# Patient Record
Sex: Female | Born: 1990 | Race: Black or African American | Hispanic: No | Smoking: Never smoker
Health system: Southern US, Community
[De-identification: ages and names within clinical notes are randomized; demographics above are authoritative.]

## PROBLEM LIST (undated history)

## (undated) DIAGNOSIS — F329 Major depressive disorder, single episode, unspecified: Secondary | ICD-10-CM

## (undated) DIAGNOSIS — F32A Depression, unspecified: Secondary | ICD-10-CM

## (undated) DIAGNOSIS — F429 Obsessive-compulsive disorder, unspecified: Secondary | ICD-10-CM

## (undated) DIAGNOSIS — F419 Anxiety disorder, unspecified: Secondary | ICD-10-CM

## (undated) DIAGNOSIS — Z915 Personal history of self-harm: Secondary | ICD-10-CM

## (undated) DIAGNOSIS — F99 Mental disorder, not otherwise specified: Secondary | ICD-10-CM

## (undated) DIAGNOSIS — E669 Obesity, unspecified: Secondary | ICD-10-CM

## (undated) DIAGNOSIS — F319 Bipolar disorder, unspecified: Secondary | ICD-10-CM

## (undated) HISTORY — DX: Obesity, unspecified: E66.9

---
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
## 2009-01-02 ENCOUNTER — Ambulatory Visit: Payer: Self-pay | Admitting: Obstetrics and Gynecology

## 2009-01-03 ENCOUNTER — Ambulatory Visit (HOSPITAL_COMMUNITY): Payer: Self-pay | Admitting: Psychology

## 2009-01-17 ENCOUNTER — Ambulatory Visit (HOSPITAL_COMMUNITY): Payer: Self-pay | Admitting: Psychiatry

## 2009-01-22 ENCOUNTER — Ambulatory Visit (HOSPITAL_COMMUNITY): Payer: Self-pay | Admitting: Psychiatry

## 2009-01-31 ENCOUNTER — Ambulatory Visit (HOSPITAL_COMMUNITY): Payer: Self-pay | Admitting: Psychiatry

## 2009-02-13 ENCOUNTER — Ambulatory Visit (HOSPITAL_COMMUNITY): Payer: Self-pay | Admitting: Licensed Clinical Social Worker

## 2009-03-05 ENCOUNTER — Ambulatory Visit (HOSPITAL_COMMUNITY): Payer: Self-pay | Admitting: Licensed Clinical Social Worker

## 2009-03-20 ENCOUNTER — Ambulatory Visit: Payer: Self-pay | Admitting: Obstetrics and Gynecology

## 2009-03-26 ENCOUNTER — Ambulatory Visit: Payer: Self-pay | Admitting: Family Medicine

## 2009-03-27 ENCOUNTER — Ambulatory Visit (HOSPITAL_COMMUNITY): Payer: Self-pay | Admitting: Licensed Clinical Social Worker

## 2009-04-08 ENCOUNTER — Ambulatory Visit (HOSPITAL_COMMUNITY): Payer: Self-pay | Admitting: Psychiatry

## 2009-05-17 ENCOUNTER — Ambulatory Visit: Payer: Self-pay | Admitting: Family Medicine

## 2009-07-08 ENCOUNTER — Ambulatory Visit (HOSPITAL_COMMUNITY): Payer: Self-pay | Admitting: Psychiatry

## 2011-03-17 LAB — POCT URINALYSIS DIP (DEVICE)
Bilirubin Urine: NEGATIVE
Glucose, UA: NEGATIVE mg/dL
Nitrite: NEGATIVE

## 2011-04-14 NOTE — Group Therapy Note (Signed)
NAMEMICHAELINA, BLANDINO NO.:  [id number]   MEDICAL RECORD NO.:  [id number]          PATIENT TYPE:  WOC   LOCATION:  [facility]                   FACILITY:  [facility]   PHYSICIAN:  Dorthula Perfect, MD     DATE OF BIRTH:  July 11, 1991   DATE OF SERVICE:                                  CLINIC NOTE   This  20 year old white female, nulligravid, whose last menstrual period  began November the 9th is here for IUD.  She has problems with bipolar  disorder and OCD, and according to the record from May 2009 has had many  admissions to [facility].  Apparently, IUD has been thought  to be the best means of providing birth control for her.  I am not sure  whether she is sexually active or not.  She has had no children.   Pap smear was done here Apr 06, 2008 and it was a negative.  Her  menstrual periods are cyclic and last 6 to 7 days.  The middle days are  somewhat heavier.  She has only mild cramping.   EXAMINATION:  Height 5 foot 4, weight 190, blood pressure 117/77.  ABDOMEN:  Flat and soft and nontender.  PELVIC:  Completely normal.   Speculum, after exam, is inserted and she has a somewhat small,  nulliparous cervix.  The anterior lip of the cervix is grasped with  sharp tenaculum and an attempt was made to sound the uterus with a  slightly curved uterine sound.  I was not able to pass a sound.  I would  rather not use endocervical dilators on this nulliparous patient.   IMPRESSION:  Attempted intrauterine device placement.   DISPOSITION:  I have asked her to return while she is on her menstrual  period to make another attempt at placing the IUD.  Hopefully, with her  menstrual period, the endocervical canal will be slightly dilated and  that will facilitate IUD placement.           ______________________________  Dorthula Perfect, MD     ER/MEDQ  D:  11/14/2008  T:  11/14/2008  Job:  [id number]

## 2011-04-14 NOTE — Group Therapy Note (Signed)
Natalie [name], Natalie [name] NO.:  [id number]   MEDICAL RECORD NO.:  [id number]          PATIENT TYPE:  WOC   LOCATION:  [facility]                   FACILITY:  [facility]   PHYSICIAN:  Argentina Donovan, MD        DATE OF BIRTH:  1991-10-22   DATE OF SERVICE:                                  CLINIC NOTE   The patient presents for IUD insertion.  Reviewing history briefly, the  patient presented November 14, 2008, and saw Dr. Perlie Gold for an IUD  insertion.  However, he was unable to insert IUD at that time, I then  instructed the patient to return when she was on her menstrual cycle to  make another attempt at placing the IUD.  The patient returns today  desiring an IUD placement.  She started her menstrual period 2 days ago,  describes it as being a normal flow and she is only having mild  cramping.   On examination today, the patient is 5 feet 4 inches,  she weights 191  pounds.  Her blood pressure is 97/63, her pulse 93, temperature is 97.6.  Pap smear was performed in May 2009, and found to be normal.  Her  abdomen is slightly obese and nontender.  No masses.  Pelvic exam is  normal.   PROCEDURE PERFORMED:  Speculum was inserted.  Cervix was easily  visualized and is nulliparous cervix.  It was cleansed using Betadine x2  and a tenaculum was placed at 7 o'clock.  Several attempts were made to  sound the uterus.  However, they were unsuccessful.  Endocervical  dilators size 1, 2, and 3, 4 were used gently on the cervix without  success.  The external os was easily dilated, the internal os was not  penetrated and attempt was aborted.   IMPRESSION:  Today, is attempted intrauterine device placement of Mirena  and contraceptive initiation visit.   DISPOSITION:  The patient was advised of options at this point  concerning birth control, given her medical history as being bipolar and  needing to be on birth control, the patient strongly desires Depo-  Provera injection today.   Education was also provided to the patient  concerning Implanon and was verified with pharmacy that no hormonal  birth control will interfere with medications that she is currently on  for her bipolar disorder.  The patient will be given 150 mg IM of Depo-  Provera.  Her options are to return in 3 months for her second Depo  injection shot or to return for the placement of a Implanon.  The  patient was instructed that if she strongly desires the placement of a  Mirena IUD, that the use of Cytotec 12-24 hours prior to insertion would  be strongly recommended; however, given that 2 failed attempts have now  occurred in placing her IUD, I strongly encouraged another form of birth  control and she agrees.     ______________________________  Maylon Cos, CNM    ______________________________  Argentina Donovan, MD    SS/MEDQ  D:  01/02/2009  T:  01/03/2009  Job:  [id number]
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
## 2011-04-17 NOTE — Discharge Summary (Signed)
Natalie [name], Natalie [name]              ACCOUNT NO.:  [id number]   MEDICAL RECORD NO.:  [id number]          PATIENT TYPE:  INP   LOCATION:  0101                          [facility]ILITY[facility]   PHYSICIAN:  Lalla Brothers, MDDATE OF BIRTH:  Jun 24, 1991   DATE OF ADMISSION:  05/11/2006  DATE OF DISCHARGE:  05/15/2006                                 DISCHARGE SUMMARY   IDENTIFICATION:  This 30-1/21-year-old female, entering the 10th grade this  fall at [employer], was admitted emergently involuntarily on a  [facility] petition for commitment in transfer from [facility] for inpatient stabilization and treatment of suicide  and homicide risk in the course of early treatment of bipolar disorder with  a longstanding history of OCD.  The patient planned to kill parents for  being overprotective and herself having a knife in her room to do so though  she later stated the knife was there from months ago.  For full details,  please see the typed admission assessment.   SYNOPSIS OF PRESENT ILLNESS:  The patient seems to identify with father in  having bipolar illness, though stating that father has much more severe  symptoms.  Mother indicates that father is taking Seroquel, Risperdal,  lithium and carbamazepine with the patient suggesting that father become  somewhat highly medicated.  Patient has done well in school except having  difficulty with math for which she needed a tutor this last school year but  brought her somewhat failing grade back up to a good grade.  Grades are  otherwise A's and B's, performing best in language arts and wanting to be an  actress, singer, or scientist.  The patient seems to look up to older  brother.  There is a paternal aunt with schizophrenia and a maternal cousin  with bipolar and ADHD.  Maternal grandfather has substance abuse with  alcohol and maternal grandmother hypertension.  Mother suggests that the  patient has currently  alienated herself from mother while being daddy's  girl.  However, the patient is somewhat defiant now for all authority.  The  patient outlines contamination, ordering, and symmetry rituals especially  excessive hand-washing.  At the time of admission, she is taking Klonopin  0.5 mg morning and bedtime and Geodon 20 mg  in the morning and 60 mg at  bedtime.   INITIAL MENTAL STATUS EXAM:  The patient was fixated in conflict with family  and the associated developmental social implications.  She was expansive in  her validation of her homicidal ideation initially but then began to  minimize and deny such.  Although she presented with poor judgment, she has  a cognitive capacity for adequate judgment.  Affectively, she becomes  undermining of judgment, particularly with manic symptoms but, at times,  mixed symptoms.  However, she does not readily consolidate a predominant  mood state but tends to be labile and inconsistent.  She has no post-  traumatic stress.   LABORATORY FINDINGS:  CBC revealed MCV slightly elevated at 93.6 with  reference range 78-92.  Red count was slightly low at  3.69 million with  lower limit of normal 3.80 million but hemoglobin was normal at 11.8 and  hematocrit 34.5.  Platelet count was low at 160,000 with reference range  190,000-420,000.  White count was normal at 7700 with normal differential.  Comprehensive metabolic panel was normal with sodium 139, potassium 4,  random glucose 84, creatinine 0.8, calcium 9.4, albumin 3.9, AST 24, ALT 16  and GGT 15.  Free T4 was normal at 1.43 and TSH at 1.659.  Urine HCG was  negative for pregnancy.  Urine drug screen was negative with creatinine of  133 mg/dL documenting specimen adequate.  Urinalysis was normal with  specific gravity of 1.024 and pH 5.5.  RPR was nonreactive.  Urine probe for  gonorrhea and chlamydia trachomatis by DNA amplification were both negative.   HOSPITAL COURSE AND TREATMENT:  General medical  exam by Jorje Guild PA-C noted  pneumonia in infancy and no medication allergies.  The patient reported  gaining 20 pounds in the last two months though she appeared thin.  She  reports eyeglasses and occasional constipation.  Her height was 64 inches  and weight was 123 pounds with blood pressure 127/86 and heart rate 87  (sitting) and 124/84 with heart rate of 78 (standing).  Vital signs were  normal throughout hospital stay with final blood pressure 111/56 with heart  rate of 105 (supine) and 102/56 with heart rate of 132 (standing).  The  patient's Klonopin was discontinued for concern of disinhibition.  The  patient remained hysteroid and labile though in a pleasant and somewhat  constructive pattern through the remainder of the hospital stay.  She was  able to adapt to the unit milieu in programming despite her history of  obsessive-compulsive symptoms.  She engaged readily in all aspects of  therapy though seeming to subgroup with staff similar to being closer to her  father in the family.  Her Geodon was increased to 40 mg in the morning and  80 mg at bedtime.  She tolerated this well with no extrapyramidal signs or  symptoms, no medication associated suicidal ideation, and no sedation or  excessive appetite.  The patient's sleep improved and she worked effectively  in family therapy especially upon admission symptoms.  The family addressed  communication and understanding with each other.  The family notes that the  patient does not assume accountability for her actions or many family  responsibilities.  Family is motivated to continue family therapy and the  patient is more understanding and accepting of her family.  Electrocardiogram on Geodon May 12, 2006 revealed sinus arrhythmia,  otherwise normal with QTC of 384 milliseconds and QRS of 76 milliseconds so  there were no contraindications to Geodon.  The patient required no seclusion or restraint during the hospital stay.   Suicide and homicidal  ideation resolved.   FINAL DIAGNOSES:  AXIS I:  Bipolar disorder not otherwise specified,  currently with partially treated manic symptoms.  Obsessive-compulsive  disorder.  Parent-child problem.  Other specified family circumstances.  Other interpersonal problem.  AXIS II:  Diagnosis deferred.  AXIS III:  Myopia, history of constipation, borderline macrocytosis,  borderline thrombocytopenia.  AXIS IV:  Stressors:  Family--moderate, acute and chronic; phase of life--  severe, acute and chronic; school--mild, acute and chronic.  AXIS V:  GAF on admission 38; highest in last year 76; discharge GAF 55.   CONDITION ON DISCHARGE:  The patient made excellent progress during the  hospital stay with efforts undertaken to  generalize such to home and  community.  She is discharged to parent in improved condition on a regular  diet and having no restrictions on physical activity.  Crisis and safety  plans are outlined if needed.  Her Klonopin is discontinued.   DISCHARGE MEDICATIONS:  Her Geodon is prescribed as 40 mg, taking 1 capsule  every morning and 2 every bedtime; quantity #24 samples given with  prescription for #90 with no refill if needed prior to upcoming appointment.   FOLLOWUP:  She will see Dr. Elsie Saas at [facility] for psychiatric  follow-up May 19, 2006 at 1645.  She will see Maryjane Hurter at [facility] for individual and family therapy May 19, 2006 at 1700.      Lalla Brothers, MD  Electronically Signed     GEJ/MEDQ  D:  05/17/2006  T:  05/17/2006  Job:  [id number]   cc:   Dr. Cathlean [facility]  [address], [address]  [place], [place]  fax [phone]   Maryjane Hurter  [facility]  [address]  [employer] [place], [place]  fax [phone]
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
## 2011-08-20 LAB — POCT PREGNANCY, URINE: Operator id: 247071

## 2011-08-20 LAB — POCT URINALYSIS DIP (DEVICE)
Operator id: 247071
Protein, ur: NEGATIVE
Specific Gravity, Urine: 1.01
Urobilinogen, UA: 0.2
pH: 7

## 2011-08-20 LAB — WET PREP, GENITAL

## 2011-08-20 LAB — GC/CHLAMYDIA PROBE AMP, GENITAL: GC Probe Amp, Genital: NEGATIVE

## 2011-08-24 LAB — WET PREP, GENITAL: Trich, Wet Prep: NONE SEEN

## 2011-08-24 LAB — GC/CHLAMYDIA PROBE AMP, GENITAL: Chlamydia, DNA Probe: NEGATIVE

## 2011-09-10 LAB — COMPREHENSIVE METABOLIC PANEL
AST: 30
Albumin: 4.4
BUN: 7
Calcium: 10.3
Creatinine, Ser: 0.68

## 2011-09-10 LAB — LIPID PANEL
Cholesterol: 270 — ABNORMAL HIGH
LDL Cholesterol: 165 — ABNORMAL HIGH
Total CHOL/HDL Ratio: 4.3
Triglycerides: 209 — ABNORMAL HIGH
VLDL: 42 — ABNORMAL HIGH

## 2011-09-10 LAB — CBC
HCT: 39.5
MCHC: 33.2
MCV: 91.7
Platelets: 263
RDW: 13.8

## 2011-09-10 LAB — URINALYSIS, ROUTINE W REFLEX MICROSCOPIC
Bilirubin Urine: NEGATIVE
Hgb urine dipstick: NEGATIVE
Ketones, ur: NEGATIVE
Protein, ur: NEGATIVE
Urobilinogen, UA: 0.2

## 2011-09-10 LAB — DIFFERENTIAL
Basophils Absolute: 0
Eosinophils Relative: 3
Lymphocytes Relative: 19 — ABNORMAL LOW
Lymphs Abs: 1.8
Monocytes Absolute: 0.4
Neutro Abs: 7.1 — ABNORMAL HIGH

## 2011-09-10 LAB — TSH: TSH: 3.114

## 2011-09-10 LAB — T4, FREE: Free T4: 1.36

## 2011-12-06 ENCOUNTER — Emergency Department (HOSPITAL_COMMUNITY)
Admission: EM | Admit: 2011-12-06 | Discharge: 2011-12-06 | Disposition: A | Discharge status: Home / Self Care | Payer: Self-pay | Attending: Emergency Medicine | Admitting: Emergency Medicine

## 2011-12-06 ENCOUNTER — Encounter: Payer: Self-pay | Admitting: *Deleted

## 2011-12-06 DIAGNOSIS — L2989 Other pruritus: Secondary | ICD-10-CM | POA: Insufficient documentation

## 2011-12-06 DIAGNOSIS — M545 Low back pain, unspecified: Secondary | ICD-10-CM | POA: Insufficient documentation

## 2011-12-06 DIAGNOSIS — R21 Rash and other nonspecific skin eruption: Secondary | ICD-10-CM | POA: Insufficient documentation

## 2011-12-06 DIAGNOSIS — Z79899 Other long term (current) drug therapy: Secondary | ICD-10-CM | POA: Insufficient documentation

## 2011-12-06 DIAGNOSIS — L298 Other pruritus: Secondary | ICD-10-CM | POA: Insufficient documentation

## 2011-12-06 HISTORY — DX: Mental disorder, not otherwise specified: F99

## 2011-12-06 MED ORDER — HYDROCODONE-ACETAMINOPHEN 5-325 MG PO TABS: 1.0000 | ORAL_TABLET | Freq: Four times a day (QID) | ORAL | Status: AC | PRN

## 2011-12-06 NOTE — ED Notes (Signed)
Pt stated understanding of discharge instructions.

## 2011-12-06 NOTE — ED Notes (Signed)
The skin on her neck is darker than the rest of her body and sometimes it tiches  For 2 weeks.  She is also c/o some lower back pain for 3-4 weeks.  No injury

## 2011-12-06 NOTE — ED Provider Notes (Signed)
History     CSN: [id number]  Arrival date & time 12/06/11  1523   First MD Initiated Contact with Patient 12/06/11 1853      Chief Complaint  Patient presents with  . Rash   low back pain  (Consider location/radiation/quality/duration/timing/severity/associated sxs/prior treatment) HPI This 21 year old female has a 2 week history of almost constant mild to moderate lumbar pain which is nonradiating and without associated symptoms it gets better with Tylenol or ibuprofen as currently gone this afternoon in the emergency department. There was no trauma. She is no fever or IV drug abuse. She is no radiation or pain down her legs. She is no change in bowel or bladder function. She is no weakness or numbness. She has no chest pain cough shortness breath abdominal pain nausea or vomiting. This is a low back pain across her lumbar region with no trauma. She is currently pain-free. She also has about a month or more of a constant hyperpigmentation around the base of her neck but no new exposures no pain no blistering no bruising no swelling and just occasional itching. Past Medical History  Diagnosis Date  . Psychiatric diagnosis     History reviewed. No pertinent past surgical history.  History reviewed. No pertinent family history.  History  Substance Use Topics  . Smoking status: Never Smoker   . Smokeless tobacco: Not on file  . Alcohol Use: No    OB History    Grav Para Term Preterm Abortions TAB SAB Ect Mult Living                  Review of Systems  Constitutional: Negative for fever.       10 Systems reviewed and are negative for acute change except as noted in the HPI.  HENT: Negative for congestion.   Eyes: Negative for discharge and redness.  Respiratory: Negative for cough and shortness of breath.   Cardiovascular: Negative for chest pain.  Gastrointestinal: Negative for vomiting and abdominal pain.  Musculoskeletal: Positive for back pain.  Skin: Positive for rash.   Neurological: Negative for syncope, numbness and headaches.  Psychiatric/Behavioral:       No behavior change.    Allergies  Review of patient's allergies indicates no known allergies.  Home Medications   Current Outpatient Rx  Name Route Sig Dispense Refill  . BUSPIRONE HCL 10 MG PO TABS Oral Take 10 mg by mouth 2 (two) times daily.      . OMEGA-3 FATTY ACIDS 1000 MG PO CAPS Oral Take 1 g by mouth 2 (two) times daily.      Marland Kitchen LITHIUM CARBONATE 300 MG PO CAPS Oral Take 600-900 mg by mouth 2 (two) times daily with a meal. 600 mg in am and 900 mg in pm     . OLANZAPINE 10 MG PO TABS Oral Take 10 mg by mouth at bedtime.      Marland Kitchen PROPRANOLOL HCL 10 MG PO TABS Oral Take 10 mg by mouth 2 (two) times daily.      Marland Kitchen HYDROCODONE-ACETAMINOPHEN 5-325 MG PO TABS Oral Take 1 tablet by mouth every 6 (six) hours as needed for pain. 20 tablet 0    BP 127/83  Pulse 93  Temp(Src) 98.6 F (37 C) (Oral)  Resp 16  SpO2 98%  LMP 11/19/2011  Physical Exam  Nursing note and vitals reviewed. Constitutional:       Awake, alert, nontoxic appearance with baseline speech.  HENT:  Head: Atraumatic.  Eyes: Pupils are  equal, round, and reactive to light. Right eye exhibits no discharge. Left eye exhibits no discharge.  Neck: Neck supple.  Cardiovascular: Normal rate and regular rhythm.   No murmur heard. Pulmonary/Chest: Effort normal and breath sounds normal. No respiratory distress. She has no wheezes. She has no rales. She exhibits no tenderness.  Abdominal: Soft. Bowel sounds are normal. She exhibits no mass. There is no tenderness. There is no rebound.  Musculoskeletal: She exhibits no edema and no tenderness.       Thoracic back: She exhibits no tenderness.       Lumbar back: She exhibits no tenderness.       Bilateral lower extremities non tender without new rashes or color change, baseline ROM with intact DP / PT pulses, CR<2 secs all digits bilaterally, sensation baseline light touch bilaterally  for pt, DTR's symmetric and intact bilaterally KJ / AJ, motor symmetric bilateral 5 / 5 hip flexion, quadriceps, hamstrings, EHL, foot dorsiflexion, foot plantarflexion, gait somewhat antalgic but without apparent new ataxia. Entire back and neck are nontender  Neurological:       Mental status baseline for patient.  Upper extremity motor strength and sensation intact and symmetric bilaterally.  Skin: Rash noted.       The patient has dry skin with hyperpigmentation at the base of her neck in the distribution of a necklace but no tenderness no petechiae no purpura no vesicles yet she has not been wearing jewelry  Psychiatric: She has a normal mood and affect.    ED Course  Procedures (including critical care time)  Labs Reviewed - No data to display No results found.   1. Lumbar pain   2. Rash       MDM  I doubt any other EMC precluding discharge at this time including, but not necessarily limited to the following:SBI, radiculopathy.        Hurman Horn, MD 12/06/11 2130

## 2012-01-08 ENCOUNTER — Emergency Department (HOSPITAL_COMMUNITY)
Admission: EM | Admit: 2012-01-08 | Discharge: 2012-01-08 | Disposition: A | Discharge status: Home / Self Care | Payer: Self-pay | Attending: Emergency Medicine | Admitting: Emergency Medicine

## 2012-01-08 ENCOUNTER — Encounter (HOSPITAL_COMMUNITY): Payer: Self-pay | Admitting: Emergency Medicine

## 2012-01-08 DIAGNOSIS — F319 Bipolar disorder, unspecified: Secondary | ICD-10-CM | POA: Insufficient documentation

## 2012-01-08 DIAGNOSIS — Z79899 Other long term (current) drug therapy: Secondary | ICD-10-CM | POA: Insufficient documentation

## 2012-01-08 DIAGNOSIS — N390 Urinary tract infection, site not specified: Secondary | ICD-10-CM | POA: Insufficient documentation

## 2012-01-08 DIAGNOSIS — R112 Nausea with vomiting, unspecified: Secondary | ICD-10-CM | POA: Insufficient documentation

## 2012-01-08 DIAGNOSIS — R197 Diarrhea, unspecified: Secondary | ICD-10-CM | POA: Insufficient documentation

## 2012-01-08 DIAGNOSIS — F429 Obsessive-compulsive disorder, unspecified: Secondary | ICD-10-CM | POA: Insufficient documentation

## 2012-01-08 HISTORY — DX: Bipolar disorder, unspecified: F31.9

## 2012-01-08 HISTORY — DX: Obsessive-compulsive disorder, unspecified: F42.9

## 2012-01-08 LAB — URINE MICROSCOPIC-ADD ON

## 2012-01-08 LAB — URINALYSIS, ROUTINE W REFLEX MICROSCOPIC
Glucose, UA: NEGATIVE mg/dL
Ketones, ur: NEGATIVE mg/dL
Nitrite: NEGATIVE
Specific Gravity, Urine: 1.008 (ref 1.005–1.030)
pH: 7.5 (ref 5.0–8.0)

## 2012-01-08 LAB — COMPREHENSIVE METABOLIC PANEL
ALT: 48 U/L — ABNORMAL HIGH (ref 0–35)
Alkaline Phosphatase: 150 U/L — ABNORMAL HIGH (ref 39–117)
BUN: 7 mg/dL (ref 6–23)
CO2: 23 mEq/L (ref 19–32)
Chloride: 106 mEq/L (ref 96–112)
GFR calc Af Amer: >90 mL/min (ref >90)
Glucose, Bld: 108 mg/dL — ABNORMAL HIGH (ref 70–99)
Potassium: 3.6 mEq/L (ref 3.5–5.1)
Sodium: 141 mEq/L (ref 135–145)
Total Bilirubin: 0.5 mg/dL (ref 0.3–1.2)
Total Protein: 7.4 g/dL (ref 6.0–8.3)

## 2012-01-08 LAB — DIFFERENTIAL
Eosinophils Absolute: 0.2 10*3/uL (ref 0.0–0.7)
Lymphocytes Relative: 30 % (ref 12–46)
Lymphs Abs: 2.4 10*3/uL (ref 0.7–4.0)
Monocytes Relative: 6 % (ref 3–12)
Neutro Abs: 5 10*3/uL (ref 1.7–7.7)
Neutrophils Relative %: 62 % (ref 43–77)

## 2012-01-08 LAB — POCT PREGNANCY, URINE: Preg Test, Ur: NEGATIVE

## 2012-01-08 LAB — CBC
Hemoglobin: 12.8 g/dL (ref 12.0–15.0)
MCH: 30.6 pg (ref 26.0–34.0)
Platelets: 227 10*3/uL (ref 150–400)
RBC: 4.18 MIL/uL (ref 3.87–5.11)
WBC: 8.1 10*3/uL (ref 4.0–10.5)

## 2012-01-08 MED ORDER — SULFAMETHOXAZOLE-TRIMETHOPRIM 800-160 MG PO TABS: 1.0000 | ORAL_TABLET | Freq: Two times a day (BID) | ORAL | Status: AC

## 2012-01-08 MED ORDER — PROMETHAZINE HCL 25 MG PO TABS: 25.0000 mg | ORAL_TABLET | Freq: Four times a day (QID) | ORAL | Status: AC | PRN

## 2012-01-08 MED ORDER — ONDANSETRON HCL 8 MG PO TABS: 4.0000 mg | ORAL_TABLET | Freq: Once | ORAL | Status: DC

## 2012-01-08 MED ORDER — ONDANSETRON 4 MG PO TBDP
ORAL_TABLET | ORAL | Status: AC
  Filled 2012-01-08: qty 1

## 2012-01-08 NOTE — ED Notes (Signed)
Pt has not had n/v/d in 3 days. Pt states that she has been gaining weight with her meds.

## 2012-01-08 NOTE — ED Notes (Signed)
Pt d/c home in NAD. Pt ambulated with quick, steady gait.  

## 2012-01-08 NOTE — ED Notes (Signed)
Pt here c/o nausea and vomiting and diarrhea x 1 week; pt sts feels like her bipolar meds are causing this; pt sts recent weight gain also; pt sts LMP was in January

## 2012-01-08 NOTE — ED Provider Notes (Signed)
History     CSN: [id number]  Arrival date & time 01/08/12  1352   First MD Initiated Contact with Patient 01/08/12 1443      Chief Complaint  Patient presents with  . Nausea  . Diarrhea    (Consider location/radiation/quality/duration/timing/severity/associated sxs/prior treatment) HPI Pt with 5 days of N/V/D. No abd pain, fever, chills, dysuria. No recent travel or dietary changes. No blood in stool or vomit. Vomiting has improved significantly though she still has loose stools.  Past Medical History  Diagnosis Date  . Psychiatric diagnosis   . Chronic bipolar disorder   . OCD (obsessive compulsive disorder)     History reviewed. No pertinent past surgical history.  History reviewed. No pertinent family history.  History  Substance Use Topics  . Smoking status: Never Smoker   . Smokeless tobacco: Not on file  . Alcohol Use: No    OB History    Grav Para Term Preterm Abortions TAB SAB Ect Mult Living                  Review of Systems  Constitutional: Negative for fever and chills.  Respiratory: Negative for shortness of breath.   Cardiovascular: Negative for chest pain.  Gastrointestinal: Positive for nausea, vomiting and diarrhea. Negative for abdominal pain, blood in stool and abdominal distention.  Genitourinary: Negative for dysuria, flank pain and pelvic pain.  Musculoskeletal: Negative for back pain.  Skin: Negative for color change, pallor and rash.  Neurological: Negative for dizziness, weakness, numbness and headaches.    Allergies  Review of patient's allergies indicates no known allergies.  Home Medications   Current Outpatient Rx  Name Route Sig Dispense Refill  . BUSPIRONE HCL 10 MG PO TABS Oral Take 10 mg by mouth 2 (two) times daily.      . OMEGA-3 FATTY ACIDS 1000 MG PO CAPS Oral Take 1 g by mouth 2 (two) times daily.      Marland Kitchen LITHIUM CARBONATE 300 MG PO CAPS Oral Take 600-900 mg by mouth 2 (two) times daily with a meal. 600 mg in am and 900  mg in pm     . OLANZAPINE 10 MG PO TABS Oral Take 10 mg by mouth at bedtime.      Marland Kitchen PROPRANOLOL HCL 10 MG PO TABS Oral Take 10 mg by mouth 2 (two) times daily.        BP 128/74  Pulse 99  Temp(Src) 98.7 F (37.1 C) (Oral)  Resp 18  SpO2 98%  Physical Exam  Nursing note and vitals reviewed. Constitutional: She is oriented to person, place, and time. She appears well-developed and well-nourished. No distress.  HENT:  Head: Normocephalic and atraumatic.  Mouth/Throat: Oropharynx is clear and moist.  Eyes: EOM are normal. Pupils are equal, round, and reactive to light.  Neck: Normal range of motion. Neck supple.  Cardiovascular: Normal rate and regular rhythm.   Pulmonary/Chest: Effort normal and breath sounds normal. No respiratory distress. She has no wheezes. She has no rales.  Abdominal: Soft. Bowel sounds are normal. She exhibits no mass. There is no tenderness. There is no rebound and no guarding.  Musculoskeletal: Normal range of motion. She exhibits no edema and no tenderness.  Neurological: She is alert and oriented to person, place, and time.  Skin: Skin is warm and dry. No rash noted. No erythema.  Psychiatric: She has a normal mood and affect. Her behavior is normal.    ED Course  Procedures (including critical care time)  Labs Reviewed  URINALYSIS, ROUTINE W REFLEX MICROSCOPIC - Abnormal; Notable for the following:    Hgb urine dipstick SMALL (*)    Leukocytes, UA MODERATE (*)    All other components within normal limits  POCT PREGNANCY, URINE  URINE MICROSCOPIC-ADD ON  CBC  DIFFERENTIAL  COMPREHENSIVE METABOLIC PANEL   No results found.   No diagnosis found.    MDM    Pt states she is feeling better. Asking for food. Encouraged to f/u with psychiatrist and return forany concerns      Loren Racer, MD 01/09/12 [phone]

## 2012-02-10 ENCOUNTER — Emergency Department (HOSPITAL_COMMUNITY)
Admission: EM | Admit: 2012-02-10 | Discharge: 2012-02-10 | Discharge status: Against Medical Advice | Payer: Self-pay | Attending: Emergency Medicine | Admitting: Emergency Medicine

## 2012-02-10 ENCOUNTER — Encounter (HOSPITAL_COMMUNITY): Payer: Self-pay | Admitting: Emergency Medicine

## 2012-02-10 DIAGNOSIS — Z049 Encounter for examination and observation for unspecified reason: Secondary | ICD-10-CM | POA: Insufficient documentation

## 2012-02-10 NOTE — ED Notes (Signed)
Pt is c/o nausea and vomiting that started a week ago  Pt states it started after the dr changed her medications and she was started on saphris  Pt states she is unable to hold anything down  Pt denies pain but states she feels weak and tired

## 2013-05-30 DIAGNOSIS — Z9151 Personal history of suicidal behavior: Secondary | ICD-10-CM

## 2013-05-30 HISTORY — DX: Personal history of suicidal behavior: Z91.51

## 2013-06-15 ENCOUNTER — Emergency Department (HOSPITAL_COMMUNITY)
Admission: EM | Admit: 2013-06-15 | Discharge: 2013-06-17 | Disposition: A | Discharge status: Home / Self Care | Payer: No Typology Code available for payment source | Attending: Emergency Medicine | Admitting: Emergency Medicine

## 2013-06-15 ENCOUNTER — Encounter (HOSPITAL_COMMUNITY): Payer: Self-pay | Admitting: *Deleted

## 2013-06-15 DIAGNOSIS — T438X2A Poisoning by other psychotropic drugs, intentional self-harm, initial encounter: Secondary | ICD-10-CM | POA: Insufficient documentation

## 2013-06-15 DIAGNOSIS — Z3202 Encounter for pregnancy test, result negative: Secondary | ICD-10-CM | POA: Insufficient documentation

## 2013-06-15 DIAGNOSIS — T43502A Poisoning by unspecified antipsychotics and neuroleptics, intentional self-harm, initial encounter: Secondary | ICD-10-CM | POA: Insufficient documentation

## 2013-06-15 DIAGNOSIS — Z79899 Other long term (current) drug therapy: Secondary | ICD-10-CM | POA: Insufficient documentation

## 2013-06-15 DIAGNOSIS — R45851 Suicidal ideations: Secondary | ICD-10-CM | POA: Insufficient documentation

## 2013-06-15 DIAGNOSIS — F3289 Other specified depressive episodes: Secondary | ICD-10-CM | POA: Insufficient documentation

## 2013-06-15 DIAGNOSIS — F319 Bipolar disorder, unspecified: Secondary | ICD-10-CM

## 2013-06-15 DIAGNOSIS — F429 Obsessive-compulsive disorder, unspecified: Secondary | ICD-10-CM | POA: Insufficient documentation

## 2013-06-15 DIAGNOSIS — F329 Major depressive disorder, single episode, unspecified: Secondary | ICD-10-CM | POA: Insufficient documentation

## 2013-06-15 LAB — CBC
HCT: 36.8 % (ref 36.0–46.0)
Hemoglobin: 12.2 g/dL (ref 12.0–15.0)
MCH: 31.2 pg (ref 26.0–34.0)
MCHC: 33.2 g/dL (ref 30.0–36.0)
MCV: 94.1 fL (ref 78.0–100.0)
RBC: 3.91 MIL/uL (ref 3.87–5.11)

## 2013-06-15 LAB — COMPREHENSIVE METABOLIC PANEL
ALT: 53 U/L — ABNORMAL HIGH (ref 0–35)
BUN: 6 mg/dL (ref 6–23)
CO2: 28 mEq/L (ref 19–32)
Calcium: 9.6 mg/dL (ref 8.4–10.5)
GFR calc Af Amer: >90 mL/min (ref >90)
GFR calc non Af Amer: >90 mL/min (ref >90)
Glucose, Bld: 112 mg/dL — ABNORMAL HIGH (ref 70–99)
Sodium: 139 mEq/L (ref 135–145)
Total Protein: 7.3 g/dL (ref 6.0–8.3)

## 2013-06-15 LAB — URINALYSIS, ROUTINE W REFLEX MICROSCOPIC
Nitrite: NEGATIVE
Protein, ur: NEGATIVE mg/dL
Specific Gravity, Urine: 1.007 (ref 1.005–1.030)
Urobilinogen, UA: 0.2 mg/dL (ref 0.0–1.0)

## 2013-06-15 LAB — POCT I-STAT, CHEM 8
Calcium, Ion: 1.24 mmol/L — ABNORMAL HIGH (ref 1.12–1.23)
Calcium, Ion: 1.24 mmol/L — ABNORMAL HIGH (ref 1.12–1.23)
Chloride: 107 mEq/L (ref 96–112)
Creatinine, Ser: 0.8 mg/dL (ref 0.50–1.10)
Glucose, Bld: 106 mg/dL — ABNORMAL HIGH (ref 70–99)
HCT: 42 % (ref 36.0–46.0)
Potassium: 3.7 mEq/L (ref 3.5–5.1)
TCO2: 26 mmol/L (ref 0–100)

## 2013-06-15 LAB — URINE MICROSCOPIC-ADD ON

## 2013-06-15 LAB — RAPID URINE DRUG SCREEN, HOSP PERFORMED
Cocaine: NOT DETECTED
Opiates: NOT DETECTED

## 2013-06-15 LAB — SALICYLATE LEVEL: Salicylate Lvl: <2 mg/dL — ABNORMAL LOW (ref 2.8–20.0)

## 2013-06-15 LAB — ETHANOL: Alcohol, Ethyl (B): <11 mg/dL (ref 0–11)

## 2013-06-15 LAB — LITHIUM LEVEL
Lithium Lvl: 1.25 mEq/L (ref 0.80–1.40)
Lithium Lvl: 1.11 mEq/L (ref 0.80–1.40)

## 2013-06-15 NOTE — ED Notes (Signed)
Charge nurse in to speak to husband because he is upset that he cannot keep belongings at bedside.

## 2013-06-15 NOTE — ED Notes (Addendum)
piosion control called recommendations to redraw litium at 4 hour from results for last litium and IV hydration for normal urine output 25ml/h. Repeat electrolyte in AM. Pt monitor for AMS.

## 2013-06-15 NOTE — ED Notes (Signed)
Patient husband has taken belongings to the car. He is upset. In to speak to patient about her lithium level rising. She nods in agreement.

## 2013-06-15 NOTE — BH Assessment (Signed)
[facility] Assessment Progress Note      Update:  Consulted with EDP [place] who stated another Lithium level will be drawn later today.  Pt will be seen by ACT if medically cleared at that time.

## 2013-06-15 NOTE — ED Notes (Signed)
RN talked to patient about her Lithium level coming back high. RN asked if patient took too many pills. Pt states she took a handful of Lithium pills. Pt states she does not know how many she took but she knows its more than 10 pills and it filled her hand. Family at bedside. Pt instructed to remove clothing and be placed in paper gowns. Pt was told she will be moved to a monitored room and a sitter by the bedside at all times. Pt states she understands. Pt obeyed all instructions and walked her and family down to room 21. Rn instructed family she and he cannot have any personal items in the room. Pt family began to get upset. Charge RN at bedside to talk to family and patient.

## 2013-06-15 NOTE — ED Notes (Signed)
Mr Hino, pt's husband: Please call when pt has a dispo.  [phone].

## 2013-06-15 NOTE — ED Notes (Signed)
Phlebotomy notified of labs needing to be drawn.

## 2013-06-15 NOTE — ED Notes (Signed)
Per pt's husband - please do not draw any additional blood from R AC anterior lateral vein as it is bruised from previous lab draw.
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
## 2013-06-15 NOTE — ED Provider Notes (Signed)
History    CSN: [id number] Arrival date & time 06/15/13  0037  First MD Initiated Contact with Patient 06/15/13 0059     Chief Complaint  Patient presents with  . Drug Overdose   (Consider location/radiation/quality/duration/timing/severity/associated sxs/prior Treatment) HPI Comments: Pt comes in with cc of Overdose. Pt states that she got overwhelmed with few of the domestic and personal issues, and felt suicidal. She took 8 lithium pills, and 405 azyprexa and buspirone around 11 pm. Pt denies nausea, emesis, fevers, chills, chest pains, shortness of breath, headaches, abdominal pain, uti like symptoms.   Patient is a 22 y.o. female presenting with Overdose. The history is provided by the patient.  Drug Overdose Pertinent negatives include no chest pain, no abdominal pain, no headaches and no shortness of breath.   Past Medical History  Diagnosis Date  . Psychiatric diagnosis   . Chronic bipolar disorder   . OCD (obsessive compulsive disorder)    History reviewed. No pertinent past surgical history. History reviewed. No pertinent family history. History  Substance Use Topics  . Smoking status: Never Smoker   . Smokeless tobacco: Not on file  . Alcohol Use: No   OB History   Grav Para Term Preterm Abortions TAB SAB Ect Mult Living                 Review of Systems  Constitutional: Positive for activity change.  HENT: Negative for neck pain.   Respiratory: Negative for shortness of breath.   Cardiovascular: Negative for chest pain.  Gastrointestinal: Negative for nausea, vomiting and abdominal pain.  Genitourinary: Negative for dysuria.  Neurological: Negative for headaches.  Psychiatric/Behavioral: Positive for suicidal ideas.    Allergies  Review of patient's allergies indicates no known allergies.  Home Medications   Current Outpatient Rx  Name  Route  Sig  Dispense  Refill  . busPIRone (BUSPAR) 10 MG tablet   Oral   Take 5 mg by mouth 2 (two) times  daily.          Marland Kitchen lithium carbonate 300 MG capsule   Oral   Take 600-900 mg by mouth 2 (two) times daily with a meal. 600 mg in am and 900 mg in pm         . OLANZapine (ZYPREXA) 10 MG tablet   Oral   Take 10 mg by mouth at bedtime.            BP 115/74  Pulse 101  Temp(Src) 98.8 F (37.1 C)  Resp 16  SpO2 94% Physical Exam  Nursing note and vitals reviewed. Constitutional: She is oriented to person, place, and time. She appears well-developed and well-nourished.  HENT:  Head: Normocephalic and atraumatic.  Eyes: EOM are normal. Pupils are equal, round, and reactive to light.  Neck: Neck supple.  Cardiovascular: Normal rate, regular rhythm and normal heart sounds.   No murmur heard. Pulmonary/Chest: Effort normal. No respiratory distress.  Abdominal: Soft. She exhibits no distension. There is no tenderness. There is no rebound and no guarding.  Neurological: She is alert and oriented to person, place, and time.  Skin: Skin is warm and dry.    ED Course  Procedures (including critical care time) Labs Reviewed  URINALYSIS, ROUTINE W REFLEX MICROSCOPIC - Abnormal; Notable for the following:    APPearance CLOUDY (*)    Hgb urine dipstick LARGE (*)    Leukocytes, UA SMALL (*)    All other components within normal limits  COMPREHENSIVE METABOLIC PANEL -  Abnormal; Notable for the following:    Glucose, Bld 112 (*)    AST 60 (*)    ALT 53 (*)    All other components within normal limits  SALICYLATE LEVEL - Abnormal; Notable for the following:    Salicylate Lvl <2.0 (*)    All other components within normal limits  URINE MICROSCOPIC-ADD ON - Abnormal; Notable for the following:    Squamous Epithelial / LPF MANY (*)    Bacteria, UA FEW (*)    All other components within normal limits  URINE CULTURE  URINE RAPID DRUG SCREEN (HOSP PERFORMED)  CBC  ETHANOL  ACETAMINOPHEN LEVEL  LITHIUM LEVEL  POCT PREGNANCY, URINE   No results found. No diagnosis found.  MDM    Date: 06/15/2013  Rate: 80  Rhythm: normal sinus rhythm  QRS Axis: normal  Intervals: normal  ST/T Wave abnormalities: normal  Conduction Disutrbances: none  Narrative Interpretation: unremarkable  Pt comes in with cc of OD. Lithium 300 mg x 8 - as acute ingestion. Lithium level is WNL. We will start hydration. No other medical concerns at this time, and the tx for the other 2 pills she took is supportive. RN to call poison control to see if they have any targeted recs for lithium, ACT consulted and spoke too.  Derwood Kaplan, MD 06/15/13 [phone]

## 2013-06-15 NOTE — ED Notes (Signed)
Critical result called to nanavati

## 2013-06-15 NOTE — ED Notes (Addendum)
Around 23:00 Pt states that she overdose on her litium, zelpraxa, and buspurion. Pt states that she intinally took her pills, and was having suicidal thoughts, but denies suicidal thoughts at this time. Pt states that she believes it was side effect of taking all the medications. Pt states that she has not had a refular check up for her lithium levels. Pt has mental health with Vesta Mixer, MD [place] [facility] is her mental health doctor.  Pt states she took about half a months worth of each medication. Pt takes some medications 3x a day. Pt denies SI or HI.

## 2013-06-16 ENCOUNTER — Inpatient Hospital Stay (HOSPITAL_COMMUNITY)
Admission: AD | Admit: 2013-06-16 | Payer: No Typology Code available for payment source | Source: Intra-hospital | Admitting: Psychiatry

## 2013-06-16 NOTE — ED Notes (Signed)
Per ACT team will set up TelePsych Eval for patient. Family at bedside. Sitter at bedside.

## 2013-06-16 NOTE — ED Notes (Signed)
Pt. To be telepsych and pending assessment will either be admitted to [facility] or sent home. Pt. And family updated on plan of care. Pt. Husband calm and cooperative.

## 2013-06-16 NOTE — ED Notes (Signed)
Update pt. On her plan of care.  Informed pt. That she will be assessed by Behavioral Health this afternoon.

## 2013-06-16 NOTE — ED Notes (Signed)
ACT team member in to assess patient without husband in room.
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
## 2013-06-16 NOTE — ED Notes (Addendum)
Pt. Husband increasingly frustrated stating "You know I could sue you for this. This is malpractice!". ACT team member and this nurse informed husband that patient admission to [facility] was in process and we were waiting on a bed. Husband states "She does not need a bed. Everyone says that she is better and they don't know why she is still here. She does not need to be admitted".

## 2013-06-16 NOTE — ED Notes (Signed)
Telepsych set up at bedside. 

## 2013-06-16 NOTE — BH Assessment (Signed)
[facility] Assessment Progress Note      Accepted to the 500 hall by Dr. Dub Mikes when a bed becomes available later today.

## 2013-06-16 NOTE — ED Notes (Signed)
Per psychiatrist, pt to bed sent home. Pt. Husband stating "Let me go talk to the emergency physician because she needs to be discharged now.". Husband informed of process for discharge. Verbalized understanding.

## 2013-06-16 NOTE — ED Notes (Signed)
gingerale ,  G. Crackers and peanut butter given.

## 2013-06-16 NOTE — ED Notes (Signed)
Pt husband requested 2 Happy Meals, 2 Cokes and a cup of ice water for his wife. Pt husband was given these items.

## 2013-06-16 NOTE — ED Notes (Addendum)
Pt. Husband at nurses station inquiring about status of patient placement. Pt. Husband frustrated and angry stating "you all are just trying to get money out of [place]. She has been here for a day and nothing is happening. Where is the psychiatrist? I will go myself and talk to him". Informed husband of admission process and updated on plan of care.

## 2013-06-16 NOTE — ED Notes (Signed)
Pt husband requested 2 cups of ice water and the water pitcher filled with ice water. Pt husband is given these items.

## 2013-06-17 LAB — URINE CULTURE

## 2013-12-21 ENCOUNTER — Encounter (HOSPITAL_COMMUNITY): Payer: Self-pay | Admitting: Emergency Medicine

## 2013-12-21 ENCOUNTER — Emergency Department (HOSPITAL_COMMUNITY)
Admission: EM | Admit: 2013-12-21 | Discharge: 2013-12-22 | Disposition: A | Discharge status: Other Acute Inpatient Hospital | Payer: No Typology Code available for payment source | Attending: Emergency Medicine | Admitting: Emergency Medicine

## 2013-12-21 DIAGNOSIS — T43502A Poisoning by unspecified antipsychotics and neuroleptics, intentional self-harm, initial encounter: Secondary | ICD-10-CM | POA: Insufficient documentation

## 2013-12-21 DIAGNOSIS — F319 Bipolar disorder, unspecified: Secondary | ICD-10-CM | POA: Insufficient documentation

## 2013-12-21 DIAGNOSIS — T6592XA Toxic effect of unspecified substance, intentional self-harm, initial encounter: Secondary | ICD-10-CM | POA: Insufficient documentation

## 2013-12-21 DIAGNOSIS — X838XXA Intentional self-harm by other specified means, initial encounter: Secondary | ICD-10-CM

## 2013-12-21 DIAGNOSIS — T438X2A Poisoning by other psychotropic drugs, intentional self-harm, initial encounter: Secondary | ICD-10-CM

## 2013-12-21 DIAGNOSIS — T50992A Poisoning by other drugs, medicaments and biological substances, intentional self-harm, initial encounter: Secondary | ICD-10-CM | POA: Insufficient documentation

## 2013-12-21 DIAGNOSIS — Z79899 Other long term (current) drug therapy: Secondary | ICD-10-CM | POA: Insufficient documentation

## 2013-12-21 DIAGNOSIS — T5694XA Toxic effect of unspecified metal, undetermined, initial encounter: Secondary | ICD-10-CM | POA: Insufficient documentation

## 2013-12-21 DIAGNOSIS — T50991A Poisoning by other drugs, medicaments and biological substances, accidental (unintentional), initial encounter: Secondary | ICD-10-CM | POA: Insufficient documentation

## 2013-12-21 DIAGNOSIS — T43501A Poisoning by unspecified antipsychotics and neuroleptics, accidental (unintentional), initial encounter: Secondary | ICD-10-CM | POA: Insufficient documentation

## 2013-12-21 LAB — CBC
HCT: 37.5 % (ref 36.0–46.0)
Hemoglobin: 12.1 g/dL (ref 12.0–15.0)
MCH: 30.5 pg (ref 26.0–34.0)
MCHC: 32.3 g/dL (ref 30.0–36.0)
MCV: 94.5 fL (ref 78.0–100.0)
Platelets: 292 10*3/uL (ref 150–400)
RBC: 3.97 MIL/uL (ref 3.87–5.11)
RDW: 13.3 % (ref 11.5–15.5)
WBC: 16 10*3/uL — ABNORMAL HIGH (ref 4.0–10.5)

## 2013-12-21 LAB — BASIC METABOLIC PANEL
BUN: 6 mg/dL (ref 6–23)
CO2: 26 mEq/L (ref 19–32)
Calcium: 10.2 mg/dL (ref 8.4–10.5)
Chloride: 100 mEq/L (ref 96–112)
Creatinine, Ser: 0.91 mg/dL (ref 0.50–1.10)
GFR calc Af Amer: >90 mL/min (ref >90)
GFR calc non Af Amer: 88 mL/min — ABNORMAL LOW (ref >90)
Glucose, Bld: 94 mg/dL (ref 70–99)
Potassium: 3.8 mEq/L (ref 3.7–5.3)
Sodium: 138 mEq/L (ref 137–147)

## 2013-12-21 LAB — ACETAMINOPHEN LEVEL: Acetaminophen (Tylenol), Serum: <15 ug/mL (ref 10–30)

## 2013-12-21 LAB — RAPID URINE DRUG SCREEN, HOSP PERFORMED
Amphetamines: NOT DETECTED
Barbiturates: NOT DETECTED
Benzodiazepines: NOT DETECTED
Cocaine: NOT DETECTED
Opiates: NOT DETECTED
Tetrahydrocannabinol: NOT DETECTED

## 2013-12-21 LAB — SALICYLATE LEVEL: Salicylate Lvl: <2 mg/dL — ABNORMAL LOW (ref 2.8–20.0)

## 2013-12-21 LAB — ETHANOL: Alcohol, Ethyl (B): <11 mg/dL (ref 0–11)

## 2013-12-21 LAB — LITHIUM LEVEL: Lithium Lvl: 1.71 mEq/L (ref 0.80–1.40)

## 2013-12-21 MED ORDER — SODIUM CHLORIDE 0.9 % IV BOLUS (SEPSIS)
2000.0000 mL | Freq: Once | INTRAVENOUS | Status: AC
  Administered 2013-12-21: 2000 mL via INTRAVENOUS

## 2013-12-21 NOTE — ED Notes (Signed)
Pt arrives by EMS/overdosed on Lithium, unknown milligrams-took ~100 tablets, took Buspar 40 tablets, unknown milligrams, and took Jordan, 2 tablets.

## 2013-12-21 NOTE — ED Notes (Signed)
Per poison control, Pt needs 2 boluses of normal saline.

## 2013-12-21 NOTE — ED Notes (Signed)
Spoke with Natalie [name] from [employer] and states watch for drowsiness, seizures, CNS symptoms, clonus, slurred speech, ataxia, QTC prolongation-states he is going to speak with his physician about further treatment and will call back.

## 2013-12-21 NOTE — ED Notes (Signed)
Pt reports that her husband was hospitalized for the past 3 days for stroke like symptoms and states that she was feeling very sad and upset because "I thought he was going to die".  Pt states she lives with her in-laws and that she has not been having a good relationship with them and her family situation at home is very stressful.  Pt states she has a history of depression and overdose in the past.  Pt states she got in to an argument with her Mother-in-law tonight and states she feels like her in-laws expect too much of her.  Pt states after going home tonight, states "I tried to sneak and overdose on my pills.  Pt states took hand full of her lithium and buspar and took her 2 Jordan

## 2013-12-21 NOTE — ED Notes (Signed)
Bed: EK35 Expected date:  Expected time:  Means of arrival:  Comments: EMS/overdose

## 2013-12-22 ENCOUNTER — Inpatient Hospital Stay (HOSPITAL_COMMUNITY)
Admission: AD | Admit: 2013-12-22 | Discharge: 2013-12-27 | DRG: 885 | Disposition: A | Discharge status: Home / Self Care | Payer: No Typology Code available for payment source | Source: Intra-hospital | Attending: Psychiatry | Admitting: Psychiatry

## 2013-12-22 ENCOUNTER — Encounter (HOSPITAL_COMMUNITY): Payer: Self-pay | Admitting: *Deleted

## 2013-12-22 DIAGNOSIS — G4700 Insomnia, unspecified: Secondary | ICD-10-CM | POA: Diagnosis present

## 2013-12-22 DIAGNOSIS — F3130 Bipolar disorder, current episode depressed, mild or moderate severity, unspecified: Secondary | ICD-10-CM | POA: Diagnosis present

## 2013-12-22 DIAGNOSIS — R45851 Suicidal ideations: Secondary | ICD-10-CM

## 2013-12-22 DIAGNOSIS — F314 Bipolar disorder, current episode depressed, severe, without psychotic features: Secondary | ICD-10-CM

## 2013-12-22 DIAGNOSIS — F319 Bipolar disorder, unspecified: Secondary | ICD-10-CM

## 2013-12-22 DIAGNOSIS — F429 Obsessive-compulsive disorder, unspecified: Secondary | ICD-10-CM | POA: Diagnosis present

## 2013-12-22 DIAGNOSIS — Z79899 Other long term (current) drug therapy: Secondary | ICD-10-CM | POA: Diagnosis not present

## 2013-12-22 LAB — BASIC METABOLIC PANEL
BUN: 5 mg/dL — ABNORMAL LOW (ref 6–23)
CO2: 24 mEq/L (ref 19–32)
Calcium: 9.6 mg/dL (ref 8.4–10.5)
Chloride: 107 mEq/L (ref 96–112)
Creatinine, Ser: 0.77 mg/dL (ref 0.50–1.10)
GFR calc Af Amer: >90 mL/min (ref >90)
GFR calc non Af Amer: >90 mL/min (ref >90)
GLUCOSE: 102 mg/dL — AB (ref 70–99)
POTASSIUM: 4.3 meq/L (ref 3.7–5.3)
SODIUM: 143 meq/L (ref 137–147)

## 2013-12-22 LAB — LITHIUM LEVEL
LITHIUM LVL: 1.46 meq/L — AB (ref 0.80–1.40)
Lithium Lvl: 2.12 mEq/L (ref 0.80–1.40)

## 2013-12-22 MED ORDER — ACETAMINOPHEN 325 MG PO TABS: 650.0000 mg | ORAL_TABLET | Freq: Four times a day (QID) | ORAL | Status: DC | PRN

## 2013-12-22 MED ORDER — SODIUM CHLORIDE 0.9 % IV BOLUS (SEPSIS)
1000.0000 mL | Freq: Once | INTRAVENOUS | Status: AC
  Administered 2013-12-22: 1000 mL via INTRAVENOUS

## 2013-12-22 MED ORDER — SODIUM CHLORIDE 0.9 % IV SOLN
INTRAVENOUS | Status: DC
  Administered 2013-12-22: 07:00:00 via INTRAVENOUS

## 2013-12-22 MED ORDER — HYDROXYZINE HCL 50 MG PO TABS
50.0000 mg | ORAL_TABLET | Freq: Every evening | ORAL | Status: DC | PRN
  Administered 2013-12-22: 50 mg via ORAL
  Filled 2013-12-22: qty 1

## 2013-12-22 MED ORDER — ONDANSETRON HCL 4 MG PO TABS: 4.0000 mg | ORAL_TABLET | Freq: Three times a day (TID) | ORAL | Status: DC | PRN

## 2013-12-22 MED ORDER — MAGNESIUM HYDROXIDE 400 MG/5ML PO SUSP: 30.0000 mL | Freq: Every day | ORAL | Status: DC | PRN

## 2013-12-22 MED ORDER — ALUM & MAG HYDROXIDE-SIMETH 200-200-20 MG/5ML PO SUSP: 30.0000 mL | ORAL | Status: DC | PRN

## 2013-12-22 MED ORDER — ONDANSETRON HCL 4 MG/2ML IJ SOLN
4.0000 mg | Freq: Once | INTRAMUSCULAR | Status: AC
  Administered 2013-12-22: 4 mg via INTRAVENOUS
  Filled 2013-12-22: qty 2

## 2013-12-22 NOTE — Progress Notes (Signed)
Patient ID: Natalie [name], female   DOB: [birth year], 23 y.o.   MRN: [id number] Psychiatric Specialty Exam: Physical Exam  ROS  Blood pressure 127/79, pulse 93, temperature 97.8 F (36.6 C), temperature source Oral, resp. rate 16, height 5\' 4"  (1.626 m), weight 108.41 kg (239 lb), last menstrual period 12/21/2013, SpO2 100.00%.Body mass index is 41 kg/(m^2).  General Appearance: Casual  Eye Contact::  Good  Speech:  Clear and Coherent and Normal Rate  Volume:  Normal  Mood:  Angry, Anxious, Depressed, Hopeless, Irritable and Worthless  Affect:  Congruent, Depressed and Flat  Thought Process:  Coherent and Goal Directed  Orientation:  Full (Time, Place, and Person)  Thought Content:  NA  Suicidal Thoughts:  No  Homicidal Thoughts:  No  Memory:  Immediate;   Good Recent;   Good Remote;   Good  Judgement:  Fair  Insight:  Present  Psychomotor Activity:  Normal  Concentration:  Good  Recall:  NA  Akathisia:  NA  Handed:  Right  AIMS (if indicated):     Assets:  Desire for Improvement Housing  Sleep:      Saw patient on rounds with Dr [phone] who was angry, agitated because of her living condition.  Patient states she is under a lot of stress because she and her husband are living with her mother in law.  Patient was tearful stating there are too many people in the house hold and it is affecting her health.  Patient states she is compliant with her medications but the stress at home coupled with losing her job is making her angry.  Patient denies SI/HI/AVH but states her depression is 10/10 and her anxiety 10/10.  Patient states she need stabilization so she can work hard and get a place of her own.  We have accepted her for admission in our mood d/o unit for stabilization. Plan: Admit to inpatient unit and resume all of her home medications.   Lucianne Muss  PMHNP-BC

## 2013-12-22 NOTE — ED Notes (Addendum)
Poison Control recommends:  Take lithium level q 6 hours with a goal of 1 or less. Increase fluids to 250 mL/ hour or to meet urine output goal of 2 mL urine/kg/hr. Repeat basic metabolic panel lab now. If tremors worsen or confusion begins - stat lithium level.  Worry for diabetes insipidous

## 2013-12-22 NOTE — ED Provider Notes (Signed)
CSN: [id number]     Arrival date & time 12/21/13  2105 History   First MD Initiated Contact with Patient 12/21/13 2117     Chief Complaint  Patient presents with  . Drug Overdose  . Suicidal   (Consider location/radiation/quality/duration/timing/severity/associated sxs/prior Treatment) HPI Patient presents emergency department with suicide attempt by taking lithium BuSpar,Latuda.  The patient, states she took an unknown quantity of lithium, Latuda and several BuSpar.  Patient, states she took a large quantity of the lithium.  Patient denies chest pain, shortness of breath, hallucinations, seizure, nausea, vomiting, diarrhea, abdominal pain, back pain, incontinence, fever, blurred vision, or syncope.  Patient, states she attempted suicide in the past, but taking an overdose.  The patient, states she took tonight at her mother-in-law and this is what  forced her to do this. Past Medical History  Diagnosis Date  . Psychiatric diagnosis   . Chronic bipolar disorder   . OCD (obsessive compulsive disorder)    History reviewed. No pertinent past surgical history. No family history on file. History  Substance Use Topics  . Smoking status: Never Smoker   . Smokeless tobacco: Not on file  . Alcohol Use: No   OB History   Grav Para Term Preterm Abortions TAB SAB Ect Mult Living                 Review of Systems All other systems negative except as documented in the HPI. All pertinent positives and negatives as reviewed in the HPI. Allergies  Review of patient's allergies indicates no known allergies.  Home Medications   Current Outpatient Rx  Name  Route  Sig  Dispense  Refill  . busPIRone (BUSPAR) 10 MG tablet   Oral   Take 5 mg by mouth 2 (two) times daily.          Marland Kitchen lithium carbonate 300 MG capsule   Oral   Take 600-900 mg by mouth 2 (two) times daily with a meal. 600 mg in am and 900 mg in pm         . Lurasidone HCl (LATUDA) 20 MG TABS   Oral   Take 1 tablet by mouth  every evening.         . Multiple Vitamin (MULTIVITAMIN WITH MINERALS) TABS tablet   Oral   Take 1 tablet by mouth daily.          BP 124/65  Pulse 87  Temp(Src) 98.8 F (37.1 C) (Oral)  Resp 16  SpO2 99%  LMP 12/21/2013 Physical Exam  Nursing note and vitals reviewed. Constitutional: She is oriented to person, place, and time. She appears well-developed and well-nourished. No distress.  HENT:  Head: Normocephalic and atraumatic.  Mouth/Throat: Oropharynx is clear and moist.  Eyes: EOM are normal. Pupils are equal, round, and reactive to light.  Neck: Normal range of motion. Neck supple.  Cardiovascular: Normal rate, regular rhythm and normal heart sounds.  Exam reveals no gallop and no friction rub.   No murmur heard. Pulmonary/Chest: Effort normal and breath sounds normal.  Neurological: She is alert and oriented to person, place, and time.  Skin: Skin is warm and dry. No rash noted. No erythema.    ED Course  Procedures (including critical care time) Labs Review Labs Reviewed  CBC - Abnormal; Notable for the following:    WBC 16.0 (*)    All other components within normal limits  BASIC METABOLIC PANEL - Abnormal; Notable for the following:    GFR calc  non Af Amer 88 (*)    All other components within normal limits  SALICYLATE LEVEL - Abnormal; Notable for the following:    Salicylate Lvl <2.0 (*)    All other components within normal limits  LITHIUM LEVEL - Abnormal; Notable for the following:    Lithium Lvl 1.71 (*)    All other components within normal limits  URINE RAPID DRUG SCREEN (HOSP PERFORMED)  ETHANOL  ACETAMINOPHEN LEVEL   patient will be monitored over several hours for her lithium levels.  Patient is not showing any outward signs of any lithium toxicity at this point.  She is mentating appropriately.  No tremor or seizure like activity.  Patient's had elevated lithium levels in the past    Carlyle Dolly, PA-C 12/22/13 0105

## 2013-12-22 NOTE — Consult Note (Signed)
[facility] Face-to-Face Psychiatry Consult   Reason for Consult:  Referral for psychiatric evaluation Referring Physician:  Trude Mcburney, PA-C Natalie [name] is an 23 y.o. female.  Assessment: AXIS I:  Bipolar, Depressed AXIS II:  Deferred AXIS III:   Past Medical History  Diagnosis Date  . Psychiatric diagnosis   . Chronic bipolar disorder   . OCD (obsessive compulsive disorder)    AXIS IV:  housing problems, occupational problems, other psychosocial or environmental problems and problems with primary support group AXIS V:  21-30 behavior considerably influenced by delusions or hallucinations OR serious impairment in judgment, communication OR inability to function in almost all areas  Plan:  Recommend psychiatric Inpatient admission when medically cleared.  Subjective:   Natalie [name] is a 23 y.o. female patient who presented to [facility] after taking pills in a suicide attempt. Patient states that over the course of the past 3 days her husband has been hospitalized for stroke symptoms. States that she started "to feel scared for him." States he was going to have a lot of doctor's appointments and "I could not deal with it." This evening she and her husband got into an argument with her father and patient states "my mind broke." States she went into the closet where her medications were kept, turned her back to her husband so he couldn't see what she was doing and she took her medications. Patient states "I knew what I was doing. I know the side effects of the medications and what they can do." Patient states that she took 10 or more Lithium tabs, unknown amount of Buspar and 2 tabs of Latuda. Patient admits that she was trying to kill herself. States that she came to the hospital voluntarily "because I need help."   Patient states that last year her husband lost his job at [employer] and "we lost everything." States that since early 2014 she and her husband have been living with her mother-in-law.  States that she has been depressed over her living situation. She states that she does not have a good relationship with her mother-in-law. Patient is tearful and states that "I can't handle the stress and depression no more." Patient admits to feeling hopeless. State "I have been experiencing anger issues, thoughts of accusing people of things, irritability and being paranoid."   Patient states she was diagnosed with bipolar when she was 14. Patient was hospitalized at [facility] in 2007 and 2008 but states she cannot remember the reason for the admissions. Patient also reports that she overdosed in her early 28s but does not remember where she was hospitalized. Patient states that she has been followed at [facility] since her bipolar diagnosis. States she sees Dr. Santiago Glad at [place]; last visit was December, 2014 and that she sees her monthly. Patient states she feels like her medications need to be adjusted. States that before starting Latuda she was on Zyprexa but was taken off Zyprexa due to "severe weight gain" of "30 lbs every 2 weeks."   Patient denies HI or AVH. She does not drink alcohol nor use illicit drugs.    HPI:  23 yo female here for suicide attempt via drug ingestion. HPI Elements:   Location:  Suicide Attempt/Ideation. Quality:  Overdose. Severity:  Severe depression. Timing:  Frequent . Duration:  Gradually over the past year with increase over past 3 days. Context:  Living situation and after argument with her father.  Past Psychiatric History: Past Medical History  Diagnosis Date  .  Psychiatric diagnosis   . Chronic bipolar disorder   . OCD (obsessive compulsive disorder)     reports that she has never smoked. She does not have any smokeless tobacco history on file. She reports that she does not drink alcohol or use illicit drugs. No family history on file.         Allergies:  No Known Allergies  ACT Assessment Complete:  No:   Past Psychiatric History:  Yes Diagnosis:  Bipolar Depressed; Suicide Attempt  Hospitalizations:  2007 & 2008 [facility]  Outpatient Care:  [facility]  Substance Abuse Care:  None  Self-Mutilation:  None  Suicidal Attempts:  Two   Homicidal Behaviors:  None   Violent Behaviors:  None   Place of Residence:  Lives with husband, mother-in-law and husband's aunt Marital Status:  Married Employed/Unemployed:  Unemployed EducationField seismologist Family Supports:  Husband Objective: Blood pressure 124/65, pulse 87, temperature 98.8 F (37.1 C), temperature source Oral, resp. rate 16, last menstrual period 12/21/2013, SpO2 99.00%.There is no height or weight on file to calculate BMI. Results for orders placed during the hospital encounter of 12/21/13 (from the past 72 hour(s))  LITHIUM LEVEL     Status: Abnormal   Collection Time    12/21/13  9:20 PM      Result Value Range   Lithium Lvl 1.71 (*) 0.80 - 1.40 mEq/L   Comment: CRITICAL RESULT CALLED TO, READ BACK BY AND VERIFIED WITH:     NEILSON,T/ED _0  ON 12/21/13 BY KARCZEWSKI,S.  CBC     Status: Abnormal   Collection Time    12/21/13  9:27 PM      Result Value Range   WBC 16.0 (*) 4.0 - 10.5 K/uL   RBC 3.97  3.87 - 5.11 MIL/uL   Hemoglobin 12.1  12.0 - 15.0 g/dL   HCT 37.5  36.0 - 46.0 %   MCV 94.5  78.0 - 100.0 fL   MCH 30.5  26.0 - 34.0 pg   MCHC 32.3  30.0 - 36.0 g/dL   RDW 13.3  11.5 - 15.5 %   Platelets 292  150 - 400 K/uL  BASIC METABOLIC PANEL     Status: Abnormal   Collection Time    12/21/13  9:27 PM      Result Value Range   Sodium 138  137 - 147 mEq/L   Potassium 3.8  3.7 - 5.3 mEq/L   Chloride 100  96 - 112 mEq/L   CO2 26  19 - 32 mEq/L   Glucose, Bld 94  70 - 99 mg/dL   BUN 6  6 - 23 mg/dL   Creatinine, Ser 0.91  0.50 - 1.10 mg/dL   Calcium 10.2  8.4 - 10.5 mg/dL   GFR calc non Af Amer 88 (*) >90 mL/min   GFR calc Af Amer >90  >90 mL/min   Comment: (NOTE)     The eGFR has been calculated using the CKD EPI equation.     This  calculation has not been validated in all clinical situations.     eGFR's persistently <90 mL/min signify possible Chronic Kidney     Disease.  ETHANOL     Status: None   Collection Time    12/21/13  9:27 PM      Result Value Range   Alcohol, Ethyl (B) <11  0 - 11 mg/dL   Comment:            LOWEST DETECTABLE LIMIT FOR  SERUM ALCOHOL IS 11 mg/dL     FOR MEDICAL PURPOSES ONLY  SALICYLATE LEVEL     Status: Abnormal   Collection Time    12/21/13  9:27 PM      Result Value Range   Salicylate Lvl <7.6 (*) 2.8 - 20.0 mg/dL  ACETAMINOPHEN LEVEL     Status: None   Collection Time    12/21/13  9:27 PM      Result Value Range   Acetaminophen (Tylenol), Serum <15.0  10 - 30 ug/mL   Comment:            THERAPEUTIC CONCENTRATIONS VARY     SIGNIFICANTLY. A RANGE OF 10-30     ug/mL MAY BE AN EFFECTIVE     CONCENTRATION FOR MANY PATIENTS.     HOWEVER, SOME ARE BEST TREATED     AT CONCENTRATIONS OUTSIDE THIS     RANGE.     ACETAMINOPHEN CONCENTRATIONS     >150 ug/mL AT 4 HOURS AFTER     INGESTION AND >50 ug/mL AT 12     HOURS AFTER INGESTION ARE     OFTEN ASSOCIATED WITH TOXIC     REACTIONS.  URINE RAPID DRUG SCREEN (HOSP PERFORMED)     Status: None   Collection Time    12/21/13  9:40 PM      Result Value Range   Opiates NONE DETECTED  NONE DETECTED   Cocaine NONE DETECTED  NONE DETECTED   Benzodiazepines NONE DETECTED  NONE DETECTED   Amphetamines NONE DETECTED  NONE DETECTED   Tetrahydrocannabinol NONE DETECTED  NONE DETECTED   Barbiturates NONE DETECTED  NONE DETECTED   Comment:            DRUG SCREEN FOR MEDICAL PURPOSES     ONLY.  IF CONFIRMATION IS NEEDED     FOR ANY PURPOSE, NOTIFY LAB     WITHIN 5 DAYS.                LOWEST DETECTABLE LIMITS     FOR URINE DRUG SCREEN     Drug Class       Cutoff (ng/mL)     Amphetamine      1000     Barbiturate      200     Benzodiazepine   160     Tricyclics       737     Opiates          300     Cocaine          300     THC               50   Labs are reviewed and are pertinent for Lithium level 1.71 and WBC 16.0.  Current Facility-Administered Medications  Medication Dose Route Frequency Provider Last Rate Last Dose  . ondansetron (ZOFRAN) tablet 4 mg  4 mg Oral Q8H PRN [facility], PA-C       Current Outpatient Prescriptions  Medication Sig Dispense Refill  . busPIRone (BUSPAR) 10 MG tablet Take 5 mg by mouth 2 (two) times daily.       Marland Kitchen lithium carbonate 300 MG capsule Take 600-900 mg by mouth 2 (two) times daily with a meal. 600 mg in am and 900 mg in pm      . Lurasidone HCl (LATUDA) 20 MG TABS Take 1 tablet by mouth every evening.      . Multiple Vitamin (MULTIVITAMIN WITH MINERALS) TABS tablet  Take 1 tablet by mouth daily.        Psychiatric Specialty Exam:     Blood pressure 124/65, pulse 87, temperature 98.8 F (37.1 C), temperature source Oral, resp. rate 16, last menstrual period 12/21/2013, SpO2 99.00%.There is no height or weight on file to calculate BMI.  General Appearance: Fairly Groomed  Engineer, water::  Minimal  Speech:  Slow  Volume:  Normal  Mood:  Depressed  Affect:  Tearful  Thought Process:  Coherent  Orientation:  Full (Time, Place, and Person)  Thought Content:  WDL  Suicidal Thoughts:  Yes.  with intent/plan  Homicidal Thoughts:  No  Memory:  Immediate;   Good Recent;   Good Remote;   Fair  Judgement:  Poor  Insight:  Fair  Psychomotor Activity:  Normal  Concentration:  Fair  Recall:  Fair  Akathisia:  No  Handed:  Right  AIMS (if indicated):     Assets:  Desire for Improvement  Sleep:      Treatment Plan Summary:  1. Admit for crisis management and stabilization. No 400 hall bed available at [facility] per [place]. Alternative placement to be sought.  2. Medication management to reduce symptoms to baseline and improve the patient's overall level of functioning. Closely monitor for side effects, efficacy and therapeutic response to medication.    Serena Colonel, FNP-BC 12/22/2013 3:16 AM

## 2013-12-22 NOTE — Progress Notes (Signed)
Adult Psychoeducational Group Note  Date:  12/22/2013 Time:  08:00pm Group Topic/Focus:  Wrap-Up Group:   The focus of this group is to help patients review their daily goal of treatment and discuss progress on daily workbooks.  Participation Level:  Active  Participation Quality:  Appropriate and Attentive  Affect:  Appropriate  Cognitive:  Alert and Appropriate  Insight: Appropriate  Engagement in Group:  Engaged  Modes of Intervention:  Discussion and Education  Additional Comments:   Pt attended and participated in group. Discussion was on how their day went. Pt stated she had a challenging day feeling depressed felt like an outsider. Now she states she has accepted how why she is here and her husband being sick has overwhelmed her.  [name] D 12/22/2013, 9:30 PM

## 2013-12-22 NOTE — Progress Notes (Addendum)
   CARE MANAGEMENT ED NOTE 12/22/2013  Patient:  Natalie [name], Natalie [name]   Account Number:  [id number]  Date Initiated:  12/22/2013  Documentation initiated by:  Edd Arbour  Subjective/Objective Assessment:   23 yr old female [facility] pt without pcp listed Pt confirms no pcp but she sees a Dr "Clydie Braun" at [facility] only at this time     Subjective/Objective Assessment Detail:   pt appreciative of resources offered Pt inquired if she and her husband can go to the same facility     Action/Plan:   CM spoke with pt and provided her with lists of pcps and psychiatrist in network within a radius of her zip code [address] from the [facility] website   Action/Plan Detail:   CM informed BH staff of pt's inquire about her and her husband going to the same facility   Anticipated DC Date:  12/22/2013     Status Recommendation to Physician:   Result of Recommendation:    Other ED Services  Consult Working Plan    DC Planning Services  Other  PCP issues  Outpatient Services - Pt will follow up    Choice offered to / List presented to:            Status of service:  Completed, signed off  ED Comments:   ED Comments Detail:  CM updated the pt that her husband will not be able to go to [facility] with her but may visit her at [facility]

## 2013-12-22 NOTE — Progress Notes (Addendum)
Patient ID: Natalie [name], female   DOB: 1991/03/29, 23 y.o.   MRN: [id number] Patient overdosed on lithium, buspar, and latuda; patient reports that she lives with her husband's mom and his aunt; her husband is 28 years old and he has been having some stroke like symptoms and trouble with his diabetes and she has thought he was not going to make it after being in the hospital recently; patient has a history of OCD and bipolar and she reports that she was diagnosed at the age of 47 and she was a patient here as a child; she currently sees Dr. Deatra Robinson at [facility]   Report given to [facility] RN and that RN to follow up on orders

## 2013-12-22 NOTE — BH Assessment (Signed)
Patient accepted to [facility] by MD Lucianne Muss. Attending MD Jonnalagadda, bed assigned 508-1.

## 2013-12-22 NOTE — ED Provider Notes (Signed)
Patient repeat lithium level now 2.2. She remained asymptomatic with normal vital signs. Discussed with poison control and they recommended observation until her lithium level is trending down. They recommend continuing IV fluids and repeat lithium level in 4 hours.  Loren Racer, MD 12/22/13 [phone]

## 2013-12-22 NOTE — ED Notes (Addendum)
Pt belongings include: 1 pink top, 1 gray bra, 1 gray skirt, 1 pair of teal underwear, 1 black sweater, 1 pair of white socks, and 1 pair of black shoes.     1 belongings bag at nurses station.

## 2013-12-22 NOTE — ED Provider Notes (Signed)
Patients repeat lithium level has decreased after IV fluids. She is now medically cleared. She has normal renal function.  Toy Baker, MD 12/22/13 1104

## 2013-12-22 NOTE — Tx Team (Signed)
Initial Interdisciplinary Treatment Plan  PATIENT STRENGTHS: (choose at least two) Ability for insight General fund of knowledge  PATIENT STRESSORS: Financial difficulties Husbands health concerns   PROBLEM LIST: Problem List/Patient Goals Date to be addressed Date deferred Reason deferred Estimated date of resolution  depression 12/22/2013     Suicidal thoughts 12/22/2013                                                DISCHARGE CRITERIA:  Reduction of life-threatening or endangering symptoms to within safe limits  PRELIMINARY DISCHARGE PLAN: Outpatient therapy Placement in alternative living arrangements  PATIENT/FAMIILY INVOLVEMENT: This treatment plan has been presented to and reviewed with the patient, Natalie [name].  The patient and family have been given the opportunity to ask questions and make suggestions.  [name] M 12/22/2013, 4:10 PM

## 2013-12-22 NOTE — BH Assessment (Signed)
Support paperwork for voluntary admission signed and given to [facility] RN; Pt accepted to [facility] by Dr Lucianne Muss. Attending will be Dr Elsie Saas, assigned bed 508-1.  Carney Bern, LCSW

## 2013-12-22 NOTE — ED Notes (Signed)
Order for NS bolus and infusion noted 2nd NS bolus from previous order still infusing with approximately 300 ml left in bag Will give 3rd bolus once 2nd is completed  Patient informed of POV--agrees and v/u VS updated and stable Patient denies further needs or complaints at this time Side rails up, call bell in reach

## 2013-12-22 NOTE — ED Notes (Signed)
3rd NS bolus started, see MAR

## 2013-12-22 NOTE — ED Provider Notes (Signed)
Medical screening examination/treatment/procedure(s) were conducted as a shared visit with non-physician practitioner(s) and myself.  I personally evaluated the patient during the encounter.  EKG Interpretation   None       Patient with OD on lithium as well as other drugs. Asymptomatic with normal vital signs. Repeat lithium level has increased to 2.2. Will repeat lithium level at 4 hours to assure down trending.  Loren Racer, MD 12/22/13 [phone]

## 2013-12-23 DIAGNOSIS — F314 Bipolar disorder, current episode depressed, severe, without psychotic features: Secondary | ICD-10-CM

## 2013-12-23 LAB — TSH: TSH: 1.657 u[IU]/mL (ref 0.350–4.500)

## 2013-12-23 MED ORDER — LAMOTRIGINE 25 MG PO TABS
25.0000 mg | ORAL_TABLET | Freq: Two times a day (BID) | ORAL | Status: DC
  Administered 2013-12-23 – 2013-12-26 (×6): 25 mg via ORAL
  Filled 2013-12-23 (×9): qty 1

## 2013-12-23 MED ORDER — TRAZODONE HCL 100 MG PO TABS
100.0000 mg | ORAL_TABLET | Freq: Every evening | ORAL | Status: DC | PRN
  Administered 2013-12-23 – 2013-12-25 (×3): 100 mg via ORAL
  Filled 2013-12-23 (×3): qty 1

## 2013-12-23 MED ORDER — HYDROXYZINE HCL 25 MG PO TABS
25.0000 mg | ORAL_TABLET | Freq: Four times a day (QID) | ORAL | Status: DC | PRN
  Filled 2013-12-23: qty 1
  Filled 2013-12-23: qty 20

## 2013-12-23 NOTE — [facility] Suicide Risk Assessment (Signed)
Suicide Risk Assessment  Admission Assessment     Nursing information obtained from:  Patient Demographic factors:  Low socioeconomic status Current Mental Status:  Suicide plan;Self-harm thoughts;Self-harm behaviors Loss Factors:  Decrease in vocational status;Decline in physical health;Financial problems / change in socioeconomic status (decline in husbands physical health) Historical Factors:  NA Risk Reduction Factors:  Sense of responsibility to family;Religious beliefs about death  CLINICAL FACTORS:   Bipolar Disorder:   Depressive phase  COGNITIVE FEATURES THAT CONTRIBUTE TO RISK:  Polarized thinking Thought constriction (tunnel vision)    SUICIDE RISK:   Severe:  Frequent, intense, and enduring suicidal ideation, specific plan, no subjective intent, but some objective markers of intent (i.e., choice of lethal method), the method is accessible, some limited preparatory behavior, evidence of impaired self-control, severe dysphoria/symptomatology, multiple risk factors present, and few if any protective factors, particularly a lack of social support.  PLAN OF CARE: Treatment Plan/Recommendations: Treatment Plan/Recommendations:  1 Admit for crisis management and stabilization. Estimated length of stay 5-7 days past her current stay of 1.  2 Individual and group therapy.  3 Medication management for depression, and anxiety to reduce current symptoms to base line and improve the overall levels of functioning: Medications reviewed with the patient and she stated no untoward effects, A) Lamictal 25mg  1 tablet po BID for bipolar;  B)Trazadone 100mg  1 tablet po QHS, repeat x1 if no relief;  C) Vistaril 25mg  1 tablet po Q6hr prn anger/anxiety/insomnia.  D) Given her last suicide attempt was in adolescence, may need to restart Lithium. 4 Coping skills for depression,anger, and anxiety developing.  5 Continue crisis stabilization and management.  6 Address health issues- monitor vital  signs, stable;  7 Treatment plan in progress to prevent relapse prevention and self care.  8 Psychosocial education regarding relapse prevention and self care  9 Heath care follow up as needed for any health concerns  10 Call for consult with hospitalist for additional specialty patient services as needed.   I certify that inpatient services furnished can reasonably be expected to improve the patient's condition.  Daniela Siebers 12/23/2013, 3:55 PM

## 2013-12-23 NOTE — Progress Notes (Signed)
Adult Psychoeducational Group Note  Date:  12/23/2013 Time:  9:16 PM  Group Topic/Focus:  Wrap-Up Group:   The focus of this group is to help patients review their daily goal of treatment and discuss progress on daily workbooks.  Participation Level:  Active  Participation Quality:  Appropriate  Affect:  Appropriate  Cognitive:  Appropriate  Insight: Appropriate  Engagement in Group:  Engaged  Modes of Intervention:  Discussion  Additional Comments:  Patient attended group and actively participated. She said that her day started out bad; she was angry. As the day progressed and after she got medication, she said she felt more leveled. She said that she has problems with anger and lashing out at people. She said that she has learned to admit her problem so that she can begin to work on solving it.   Rosilyn Mings A 12/23/2013, 9:16 PM

## 2013-12-23 NOTE — [facility] Group Notes (Signed)
[facility] LCSW Group Therapy Note  12/23/2013 1:15  To 2:15 PM  Type of Therapy and Topic:  Group Therapy: Avoiding Self-Sabotaging and Enabling Behaviors  Participation Level:  Minimal   Mood: Flat, depressed  Description of Group:     Learn how to identify obstacles, self-sabotaging and enabling behaviors, what are they, why do we do them and what needs do these behaviors meet? Discuss unhealthy relationships and how to have positive healthy boundaries with those that sabotage and enable. Explore aspects of self-sabotage and enabling in yourself and how to limit these self-destructive behaviors in everyday life.  Therapeutic Goals: 1. Patient will identify one obstacle that relates to self-sabotage and enabling behaviors 2. Patient will identify one personal self-sabotaging or enabling behavior they did prior to admission 3. Patient able to establish a plan to change the above identified behavior they did prior to admission:  4. Patient will demonstrate ability to communicate their needs through discussion and/or role plays.   Summary of Patient Progress: The main focus of today's process group was to explain what "self-sabotage" means and use Motivational Interviewing to discuss what benefits, negative or positive, were involved in a self-identified self-sabotaging behavior. We then talked about reasons the patient may want to change the behavior and her current desire to change. Patient came in late to group yet was willing to share that it was not her idea to come to hospital yet she feels "it is a good thing." Patient was quietly attentive to group process. This being her first group session of stay it is expected she will open up to therapeutic process.  Therapeutic Modalities:   Cognitive Behavioral Therapy Person-Centered Therapy Motivational Interviewing   Carney Bern, LCSW

## 2013-12-23 NOTE — Progress Notes (Signed)
Psychoeducational Group Note  Date: 12/23/2013 Time:  1015  Group Topic/Focus:  Identifying Needs:   The focus of this group is to help patients identify their personal needs that have been historically problematic and identify healthy behaviors to address their needs.  Participation Level:  Active  Participation Quality:  Appropriate  Affect:  Appropriate  Cognitive:  Appropriate  Insight:  Improving  Engagement in Group:  Engaged  Additional Comments:    Weslee Prestage A 

## 2013-12-23 NOTE — Progress Notes (Addendum)
D: Pt denies SI/HI/AVH. Pt is pleasant and cooperative. Pt alert oriented, showing no signs of Lithium toxicity or complaining of problems. Johnnette Barrios called to check on the status of the pt, she was from [employer].  A: Pt was offered support and encouragement. Pt was given scheduled medications. Pt was encourage to attend groups. Q 15 minute checks were done for safety.   R:Pt attends groups and interacts well with peers and staff. Pt is taking medication. Pt has no complaints at this time.Pt receptive to treatment and safety maintained on unit.

## 2013-12-23 NOTE — Progress Notes (Signed)
D) Pt has attended the program and interacts with her peers. Pt rates her depression at a 9 and her hopelessness at a 1. Denies SI and HI. Pt attends the groups and interacts with her peers. States "I have a problem and I want to deal with it in a good way. Not by hurting myself". Affect will brighten with interaction at times. A) Given support, reassurance and praise. Encouragement given for Pt doing her packet and attending the groups. R) Pt denies SI and HI. Pt is invested in getting better.
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
## 2013-12-23 NOTE — Progress Notes (Addendum)
Patient ID: Natalie [name], female   DOB: 12/31/1990, 23 y.o.   MRN: [id number] D)  Has been out on the hall and in the dayroom this evening, pleasant, smiling and laughing with select peers, interacting appropriately with staff and peers.  Attended group and participated, compliant with meds, relieved to have been started on meds, states feels better already knowing the problem is being addressed.  Stated had been upset today about being here, but has realized that she isn't alone in her problems, feels thankful she is here.  Stated her husband is her only support. A)  Will continue to monitor for safety, continue POC, support R)  Safety maintained.

## 2013-12-24 LAB — CREATININE, SERUM
CREATININE: 0.84 mg/dL (ref 0.50–1.10)
GFR calc Af Amer: >90 mL/min (ref >90)
GFR calc non Af Amer: >90 mL/min (ref >90)

## 2013-12-24 LAB — TSH: TSH: 1.966 u[IU]/mL (ref 0.350–4.500)

## 2013-12-24 LAB — BUN: BUN: 7 mg/dL (ref 6–23)

## 2013-12-24 LAB — LITHIUM LEVEL: LITHIUM LVL: 0.41 meq/L — AB (ref 0.80–1.40)

## 2013-12-24 NOTE — [facility] Group Notes (Signed)
[facility] LCSW Group Therapy Note   12/24/2013  1:15 To 2:15 PM   Type of Therapy and Topic: Group Therapy: Feelings Around Returning Home & Establishing a Supportive Framework and Activity to Identify signs of Improvement or Decompensation   Participation Level: Active  Mood: appropriate  Description of Group:  Patients first processed thoughts and feelings about up coming discharge. These included fears of upcoming changes, lack of change, new living environments, judgements and expectations from others and overall stigma of MH issues. We then discussed what is a supportive framework? What does it look like feel like and how do I discern it from and unhealthy non-supportive network? Learn how to cope when supports are not helpful and don't support you. Discuss what to do when your family/friends are not supportive.   Therapeutic Goals Addressed in Processing Group:  1. Patient will identify one healthy supportive network that they can use at discharge. 2. Patient will identify one factor of a supportive framework and how to tell it from an unhealthy network. 3. Patient able to identify one coping skill to use when they do not have positive supports from others. 4. Patient will demonstrate ability to communicate their needs through discussion and/or role plays.  Summary of Patient Progress:  Pt engaged easily during group session yet needed confrontation to avoid blaming others for her overdose (father). She continued to process her father's failure to be a healthy supports until charged with naming potential supports. Patient believes she only needs her husband.  Patient continues to seek attention.   Carney Bern, LCSW
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
## 2013-12-24 NOTE — Progress Notes (Signed)
Adult Psychoeducational Group Note  Date:  12/24/2013 Time:  7:18 PM  Group Topic/Focus:  Therapeutic  Activity   Participation Level:  Active  Participation Quality:  Appropriate and Attentive  Affect:  Appropriate  Cognitive:  Appropriate  Insight: Appropriate  Engagement in Group:  Engaged  Modes of Intervention:  Activity and Discussion   Revan Gendron N 12/24/2013, 7:18 PM 

## 2013-12-24 NOTE — Progress Notes (Signed)
Psychoeducational Group Note  Date:  12/24/2013 Time:  1015  Group Topic/Focus:  Making Healthy Choices:   The focus of this group is to help patients identify negative/unhealthy choices they were using prior to admission and identify positive/healthier coping strategies to replace them upon discharge.  Participation Level:  Active  Participation Quality:  Appropriate  Affect:  Appropriate  Cognitive:  Oriented  Insight:  Improving  Engagement in Group:  Engaged  Additional Comments:    Timoth Schara A 12/24/2013 

## 2013-12-24 NOTE — Progress Notes (Signed)
Adult Psychoeducational Group Note  Date:  12/24/2013 Time:  10:04 PM  Group Topic/Focus:  Wrap-Up Group:   The focus of this group is to help patients review their daily goal of treatment and discuss progress on daily workbooks.  Participation Level:  Active  Participation Quality:  Appropriate  Affect:  Appropriate  Cognitive:  Appropriate  Insight: Appropriate  Engagement in Group:  Engaged  Modes of Intervention:  Discussion  Additional Comments: The patient expressed that she  learned from group to use her coping skills.The patient said she needs to use coping skills in life.  Octavio Manns 12/24/2013, 10:04 PM

## 2013-12-24 NOTE — Progress Notes (Signed)
Psychoeducational Group Note  Date: 12/24/2013 Time: 0930  Group Topic/Focus:  Gratefulness:  The focus of this group is to help patients identify what two things they are most grateful for in their lives. What helps ground them and to center them on their work to their recovery.  Participation Level:  Active  Participation Quality:  Attentive  Affect:  Appropriate  Cognitive:  Appropriate  Insight:  Improving  Engagement in Group:  Engaged  Additional Comments:    Malaysia Crance A  

## 2013-12-24 NOTE — Progress Notes (Signed)
Patient ID: Natalie [name], female   DOB: [birth year], 23 y.o.   MRN: [id number] D)  Was bright and positive earlier on this shift, but then was crying in her room.. Stated she was doing OK, that she and her husband were coming up with a plan for when she is discharged, and they've both decided they can't go back to the same situation.  Wishes he could come and visit, but said he is actually working on finding a different place to live and getting that in place while she is here.  Wishes she could be discharged so that she could be helping him.  Feels that she has her meds straightened out, feeling better, feels she has learned a lot from the groups and is learning to apply what she learned.  Also voiced she was uncomfortable having a female tech in the room with her roommate on 1:1, was switched out with female tech. A)  Will continue to monitor for safety, continue POC R)  safety maintained

## 2013-12-24 NOTE — Progress Notes (Signed)
[facility] MD Progress Note  12/24/2013 5:25 PM Natalie [name]  MRN:  [id number] Subjective:  She states she is feeling better than she was. Initially she didn't see a way out, now she is able to talk and express herself better. She also reports that she no longer cares if people dont like her, she is going to mind her own business/  She still expresses concerns about increased levels of anxiety about her lithium levels, rating 0/10 and her depression is at 0/10. She reports resting well last night. Upon discharge she is hoping to go home. She is attending groups, although sitting in groups(people staring and snickering) is causing her anxiety levels to increase. Denies any SI/HI/AVH.  Diagnosis:   DSM5: Schizophrenia Disorders:   Obsessive-Compulsive Disorders: OCD  Trauma-Stressor Disorders:   Substance/Addictive Disorders:   Depressive Disorders:  Major Depressive Disorder - Severe (296.23)  Axis I: Bipolar, Depressed and Major Depression, Recurrent severe Axis IV: housing problems, problems with access to health care services and problems with primary support group Axis V: 31-40 impairment in reality testing  ADL's:  Intact  Sleep: Good  Appetite:  Good  Suicidal Ideation:  Plan:  Denies  Intent:  Denies Means:  Denies Homicidal Ideation:  Plan:  Denies Intent:  Denies Means:  Denies AEB (as evidenced by):  Psychiatric Specialty Exam: Review of Systems  Psychiatric/Behavioral: Positive for depression. Negative for suicidal ideas, hallucinations and substance abuse. The patient is nervous/anxious. The patient does not have insomnia.   All other systems reviewed and are negative.    Blood pressure 121/77, pulse 80, temperature 98 F (36.7 C), temperature source Oral, resp. rate 20, height _0  (1.626 m), weight 108.41 kg (239 lb), last menstrual period 12/21/2013, SpO2 100.00%.Body mass index is 41 kg/(m^2).  General Appearance: Casual and Fairly Groomed  Engineer, water::  Good   Speech:  Clear and Coherent and Normal Rate  Volume:  Normal  Mood:  Euthymic  Affect:  Depressed and Flat  Thought Process:  Coherent  Orientation:  Full (Time, Place, and Person)  Thought Content:  WDL  Suicidal Thoughts:  No  Homicidal Thoughts:  No  Memory:  Immediate;   Good Recent;   Good Remote;   Good  Judgement:  Intact  Insight:  Fair  Psychomotor Activity:  Normal  Concentration:  Good  Recall:  Good  Akathisia:  No  Handed:  Right  AIMS (if indicated):     Assets:  Desire for Improvement Intimacy Physical Health Social Support  Sleep:  Number of Hours: 5.5   Current Medications: Current Facility-Administered Medications  Medication Dose Route Frequency Provider Last Rate Last Dose  . acetaminophen (TYLENOL) tablet 650 mg  650 mg Oral Q6H PRN Delfin Gant, NP      . alum & mag hydroxide-simeth (MAALOX/MYLANTA) 200-200-20 MG/5ML suspension 30 mL  30 mL Oral Q4H PRN Delfin Gant, NP      . hydrOXYzine (ATARAX/VISTARIL) tablet 25 mg  25 mg Oral Q6H PRN Nanci Pina, FNP      . lamoTRIgine (LAMICTAL) tablet 25 mg  25 mg Oral BID Nanci Pina, FNP   25 mg at 12/24/13 0756  . magnesium hydroxide (MILK OF MAGNESIA) suspension 30 mL  30 mL Oral Daily PRN Delfin Gant, NP      . traZODone (DESYREL) tablet 100 mg  100 mg Oral QHS PRN Nanci Pina, FNP   100 mg at 12/23/13 2133    Lab Results:  Results for orders placed during the hospital encounter of 12/22/13 (from the past 48 hour(s))  TSH     Status: None   Collection Time    12/22/13  7:45 PM      Result Value Range   TSH 1.657  0.350 - 4.500 uIU/mL   Comment: Performed at [facility]  BUN     Status: None   Collection Time    12/24/13  6:40 AM      Result Value Range   BUN 7  6 - 23 mg/dL   Comment: Performed at [place], SERUM     Status: None   Collection Time    12/24/13  6:40 AM      Result Value Range   Creatinine, Ser 0.84  0.50  - 1.10 mg/dL   GFR calc non Af Amer >90  >90 mL/min   GFR calc Af Amer >90  >90 mL/min   Comment: (NOTE)     The eGFR has been calculated using the CKD EPI equation.     This calculation has not been validated in all clinical situations.     eGFR's persistently <90 mL/min signify possible Chronic Kidney     Disease.     Performed at [facility] LEVEL     Status: Abnormal   Collection Time    12/24/13  6:40 AM      Result Value Range   Lithium Lvl 0.41 (*) 0.80 - 1.40 mEq/L   Comment: Performed at [facility]    Physical Findings: AIMS: Facial and Oral Movements Muscles of Facial Expression: None, normal Lips and Perioral Area: None, normal Jaw: None, normal Tongue: None, normal,Extremity Movements Upper (arms, wrists, hands, fingers): None, normal Lower (legs, knees, ankles, toes): None, normal, Trunk Movements Neck, shoulders, hips: None, normal, Overall Severity Severity of abnormal movements (highest score from questions above): None, normal Incapacitation due to abnormal movements: None, normal Patient's awareness of abnormal movements (rate only patient's report): No Awareness, Dental Status Current problems with teeth and/or dentures?: No Does patient usually wear dentures?: No  CIWA:    COWS:     Treatment Plan Summary: Daily contact with patient to assess and evaluate symptoms and progress in treatment Medication management  Plan:Treatment Plan/Recommendations:  1 Admit for crisis management and stabilization. Estimated length of stay 5-7 days past her current stay of 1.  2 Individual and group therapy. 3 Medication management for depression, and anxiety to reduce current symptoms to base line and improve the overall levels of functioning: Medications reviewed with the patient and she stated no untoward effects, home medications in place.  4 Coping skills for depression and anxiety developing.  5 Continue crisis  stabilization and management.  6 Address health issues- monitor vital signs, stable;  Lithium levels are trending downward-reviewed with patient.  7 Treatment plan in progress to prevent relapse prevention and self care.  8 Psychosocial education regarding relapse prevention and self care 9 Heath care follow up as needed for any health concerns 10 Call for consult with hospitalist for additional specialty patient services as needed.   Medical Decision Making Problem Points:  Established problem, stable/improving (1), Review of last therapy session (1) and Review of psycho-social stressors (1) Data Points:  Review or order clinical lab tests (1) Review or order medicine tests (1) Review of medication regiment & side effects (2) Review of new medications or change in dosage (2)  I certify  that inpatient services furnished can reasonably be expected to improve the patient's condition.   Priscille Loveless S 12/24/2013, 5:25 PM

## 2013-12-25 DIAGNOSIS — F319 Bipolar disorder, unspecified: Secondary | ICD-10-CM

## 2013-12-25 NOTE — Tx Team (Signed)
Interdisciplinary Treatment Plan Update   Date Reviewed:  12/25/2013  Time Reviewed:  8:33 AM  Progress in Treatment:   Attending groups: Yes Participating in groups: Yes Taking medication as prescribed: Yes  Tolerating medication: Yes Family/Significant other contact made: No, but will ask patient for consent for collateral contact Patient understands diagnosis: Yes  Discussing patient identified problems/goals with staff: Yes Medical problems stabilized or resolved: Yes Denies suicidal/homicidal ideation: Yes Patient has not harmed self or others: Yes  For review of initial/current patient goals, please see plan of care.  Estimated Length of Stay:    Reasons for Continued Hospitalization:  Anxiety Depression Medication stabilization Suicidal ideation  New Problems/Goals identified:    Discharge Plan or Barriers:   Home with outpatient follow up  Additional Comments:  Natalie [name] is a 23 y.o. female patient who presented to [facility] after taking pills in a suicide attempt. Patient states that over the course of the past 3 days her husband has been hospitalized for stroke like symptoms 2/t diabetes. States that she started "to feel scared for him." States he was going to have a lot of doctor's appointments and "I could not deal with it." This evening she and her husband got into an argument with her father and patient states "my mind broke." States she went into the closet where her medications were kept, turned her back to her husband so he couldn't see what she was doing and she took her medications. Patient states "I knew what I was doing. I know the side effects of the medications and what they can do." Patient states that she took 10 or more Lithium tabs, unknown amount of Buspar, 2 tabs of Latuda and a few multivitamins. Patient admits that she was trying to kill herself, but her husband stopped. States that she came to the hospital voluntarily "because I need  help."   Attendees:  Patient:  12/25/2013 8:33 AM   Signature: Mervyn Gay, MD 12/25/2013 8:33 AM  Signature:   12/25/2013 8:33 AM  Signature:  Claudette Head, NP 12/25/2013 8:33 AM  Signature:Beverly Terrilee Croak, RN 12/25/2013 8:33 AM  Signature:  Neill Loft RN 12/25/2013 8:33 AM  Signature:  Juline Patch, LCSW 12/25/2013 8:33 AM  Signature:  Reyes Ivan, LCSW 12/25/2013 8:33 AM  Signature:  Leisa Lenz, Care Coordinator 12/25/2013 8:33 AM  Signature:   12/25/2013 8:33 AM  Signature:  12/25/2013  8:33 AM  Signature:   Onnie Boer, RN [facility] 12/25/2013  8:33 AM  Signature:  12/25/2013  8:33 AM    Scribe for Treatment Team:   Juline Patch,  12/25/2013 8:33 AM

## 2013-12-25 NOTE — Progress Notes (Signed)
Patient seen, examined by me, needs inpatient hospitalization as she reports she is overwhelmed, tearful, reports multiple stressors and feels that she cannot keep herself safe

## 2013-12-25 NOTE — [facility] Group Notes (Signed)
[facility] LCSW Aftercare Discharge Planning Group Note   12/25/2013 9:56 AM    Participation Quality:  Appropraite  Mood/Affect:  Appropriate  Depression Rating:  0  Anxiety Rating:  0  Thoughts of Suicide:  No  Will you contract for safety?   NA  Current AVH:  No  Plan for Discharge/Comments:  Patient attended discharge planning group and actively participated in group.  She advised of having outpatient services at [facility].  CSW provided all participants with daily workbook.   Transportation Means: Patient has transportation.   Supports:  Patient has a support system.   Jihan Mellette, Joesph July

## 2013-12-25 NOTE — [facility] Group Notes (Signed)
[facility] LCSW Group Therapy          Overcoming Obstacles       1:15 -2:30        12/25/2013   3:55 PM     Type of Therapy:  Group Therapy  Participation Level:  Appropriate  Participation Quality:  Appropriate  Affect:  Appropriate, Alert  Cognitive:  Attentive Appropriate  Insight: Developing/Improving Engaged  Engagement in Therapy: Developing/Imprvoing Engaged  Modes of Intervention:  Discussion Exploration  Education Rapport BuildingProblem-Solving Support  Summary of Progress/Problems:  The main focus of today's group was overcoming obstacles.  Patient shared the obstacle she has to overcome is her illness.  She shared she has to take charge of it rather than allowing the illness to control her.  She talked about how the medication change has been really good.  She also talked about the need to control anger.       Patient able to identify appropriate coping skills.   Wynn Banker 12/25/2013    3:55 PM

## 2013-12-25 NOTE — Progress Notes (Signed)
Patient ID: Natalie [name], female   DOB: 03-07-1991, 23 y.o.   MRN: [id number] [facility] MD Progress Note  12/25/2013 12:28 PM Natalie [name]  MRN:  [id number]  Subjective:  She was admitted for depression with a suicidal attempt of taking overdose on multiple psychiatric prescription medication including lithium, buspar and latuda and she has been diagnosed with bipolar disorder.  Patient reported that she has been stressed out, depressed and angry when she made a suicide attempt. Patient reported her husband has been in and out of the hospitals for stroke which made her feel stressed out patient also stressed at work and living with her husband's family which is not good environment for her. Patient has been having her interpersonal relationship problems. Initially she didn't see a way out, now she is able to talk and express herself better. She has been struggling with her emotional instability, disturbance of sleep and not sure her current medications working well and are not. She continued to have a increased levels of anxiety about her drug overdose especially lithium levels, which is 0.41 mEq per liter, nontoxic at this time and symptoms of depression. She stated that her husband found a place to live other than his mother's home and hoping to move into new place upon discharge. She is attending groups, although sitting in groups which is is causing her anxiety levels to increase. Patient contracts for safety while in the hospital. Patient reported he has plans to go to [facility] for outpatient psychiatric care.   Diagnosis:   DSM5: Schizophrenia Disorders:   Obsessive-Compulsive Disorders: OCD  Trauma-Stressor Disorders:   Substance/Addictive Disorders:   Depressive Disorders:  Major Depressive Disorder - Severe (296.23)  Axis I: Bipolar, Depressed and Major Depression, Recurrent severe Axis IV: housing problems, problems with access to health care services and problems with primary support  group Axis V: 31-40 impairment in reality testing  ADL's:  Intact  Sleep: Good  Appetite:  Good  Suicidal Ideation:  Plan:  Denies  Intent:  Denies Means:  Denies Homicidal Ideation:  Plan:  Denies Intent:  Denies Means:  Denies AEB (as evidenced by):  Psychiatric Specialty Exam: Review of Systems  Psychiatric/Behavioral: Positive for depression. Negative for suicidal ideas, hallucinations and substance abuse. The patient is nervous/anxious. The patient does not have insomnia.   All other systems reviewed and are negative.    Blood pressure 114/79, pulse 97, temperature 97.7 F (36.5 C), temperature source Oral, resp. rate 16, height 5' 4"  (1.626 m), weight 108.41 kg (239 lb), last menstrual period 12/21/2013, SpO2 100.00%.Body mass index is 41 kg/(m^2).  General Appearance: Casual and Fairly Groomed, musculoskeletal functions are within normal limits   Eye Contact::  Good  Speech:  Clear and Coherent and Normal Rate, patient has intact language without difficulties   Volume:  Normal  Mood:  Euthymic  Affect:  Depressed and Flat  Thought Process:  Coherent  Orientation:  Full (Time, Place, and Person)  Thought Content:  WDL  Suicidal Thoughts:  No  Homicidal Thoughts:  No  Memory:  Immediate;   Good Recent;   Good Remote;   Good  Judgement:  Intact  Insight:  Fair  Psychomotor Activity:  Normal  Concentration:  Good, has normal fund of knowledge.   Recall:  Good  Akathisia:  No  Handed:  Right  AIMS (if indicated):     Assets:  Desire for Improvement Intimacy Physical Health Social Support  Sleep:  Number of Hours: 5   Current  Medications: Current Facility-Administered Medications  Medication Dose Route Frequency Provider Last Rate Last Dose  . acetaminophen (TYLENOL) tablet 650 mg  650 mg Oral Q6H PRN Delfin Gant, NP      . alum & mag hydroxide-simeth (MAALOX/MYLANTA) 200-200-20 MG/5ML suspension 30 mL  30 mL Oral Q4H PRN Delfin Gant, NP       . hydrOXYzine (ATARAX/VISTARIL) tablet 25 mg  25 mg Oral Q6H PRN Nanci Pina, FNP      . lamoTRIgine (LAMICTAL) tablet 25 mg  25 mg Oral BID Nanci Pina, FNP   25 mg at 12/25/13 0834  . magnesium hydroxide (MILK OF MAGNESIA) suspension 30 mL  30 mL Oral Daily PRN Delfin Gant, NP      . traZODone (DESYREL) tablet 100 mg  100 mg Oral QHS PRN Nanci Pina, FNP   100 mg at 12/24/13 2114    Lab Results:  Results for orders placed during the hospital encounter of 12/22/13 (from the past 48 hour(s))  BUN     Status: None   Collection Time    12/24/13  6:40 AM      Result Value Range   BUN 7  6 - 23 mg/dL   Comment: Performed at [place], SERUM     Status: None   Collection Time    12/24/13  6:40 AM      Result Value Range   Creatinine, Ser 0.84  0.50 - 1.10 mg/dL   GFR calc non Af Amer >90  >90 mL/min   GFR calc Af Amer >90  >90 mL/min   Comment: (NOTE)     The eGFR has been calculated using the CKD EPI equation.     This calculation has not been validated in all clinical situations.     eGFR's persistently <90 mL/min signify possible Chronic Kidney     Disease.     Performed at [place] LEVEL     Status: Abnormal   Collection Time    12/24/13  6:40 AM      Result Value Range   Lithium Lvl 0.41 (*) 0.80 - 1.40 mEq/L   Comment: Performed at [facility]  TSH     Status: None   Collection Time    12/24/13  6:40 AM      Result Value Range   TSH 1.966  0.350 - 4.500 uIU/mL   Comment: Performed at [facility]    Physical Findings: AIMS: Facial and Oral Movements Muscles of Facial Expression: None, normal Lips and Perioral Area: None, normal Jaw: None, normal Tongue: None, normal,Extremity Movements Upper (arms, wrists, hands, fingers): None, normal Lower (legs, knees, ankles, toes): None, normal, Trunk Movements Neck, shoulders, hips: None, normal, Overall  Severity Severity of abnormal movements (highest score from questions above): None, normal Incapacitation due to abnormal movements: None, normal Patient's awareness of abnormal movements (rate only patient's report): No Awareness, Dental Status Current problems with teeth and/or dentures?: No Does patient usually wear dentures?: No  CIWA:  CIWA-Ar Total: 1 COWS:     Treatment Plan Summary: Daily contact with patient to assess and evaluate symptoms and progress in treatment Medication management for bipolar disorder, mood swings, depression and recent suicide attempt  Plan:Treatment Plan/Recommendations:  1 Admit for crisis management and stabilization.  Estimated length of stay 5-7 days  Increase Lamictal 50 mg twice daily for mood swings  Increase trazodone 100 mg at bedtime  for insomnia  2 Individual and group therapy. 3 Medication management for depression, and anxiety to reduce current symptoms to base line and improve the overall levels of functioning: Medications reviewed with the patient and she stated no untoward effects, home medications in place.  4 Coping skills for depression and anxiety developing.  5 Continue crisis stabilization and management.  6 Address health issues- monitor vital signs, stable;  Lithium levels are trending downward-reviewed with patient.  7 Treatment plan in progress to prevent relapse prevention and self care.  8 Psychosocial education regarding relapse prevention and self care 9 Heath care follow up as needed for any health concerns 10 Call for consult with hospitalist for additional specialty patient services as needed.   Medical Decision Making Problem Points:  Established problem, stable/improving (1), Review of last therapy session (1) and Review of psycho-social stressors (1) Data Points:  Review or order clinical lab tests (1) Review or order medicine tests (1) Review of medication regiment & side effects (2) Review of new medications or  change in dosage (2)  I certify that inpatient services furnished can reasonably be expected to improve the patient's condition.   Erilyn Pearman,JANARDHAHA R. 12/25/2013, 12:28 PM

## 2013-12-25 NOTE — Progress Notes (Signed)
Adult Psychoeducational Group Note  Date:  12/25/2013 Time:  10:49 PM  Group Topic/Focus:  Goals Group:   The focus of this group is to help patients establish daily goals to achieve during treatment and discuss how the patient can incorporate goal setting into their daily lives to aide in recovery.  Participation Level:  Active  Participation Quality:  Appropriate  Affect:  Appropriate  Cognitive:  Appropriate  Insight: Appropriate  Engagement in Group:  Engaged  Modes of Intervention:  Discussion  Additional Comments:  Pt stated excellent day she met some new people and want to get better while she is here.  Alexis Goodell R 12/25/2013, 10:49 PM

## 2013-12-25 NOTE — [facility] Suicide Risk Assessment (Signed)
[facility] INPATIENT:  Family/Significant Other Suicide Prevention Education  Suicide Prevention Education:  Education Completed; Natalie [name], Natalie [name], Husband, [phone];  has been identified by the patient as the family member/significant other with whom the patient will be residing, and identified as the person(s) who will aid the patient in the event of a mental health crisis (suicidal ideations/suicide attempt).  With written consent from the patient, the family member/significant other has been provided the following suicide prevention education, prior to the and/or following the discharge of the patient.  The suicide prevention education provided includes the following:  Suicide risk factors  Suicide prevention and interventions  National Suicide Hotline telephone number  [facility] assessment telephone number  [facility] Emergency Assistance 911  [facility] and/or Residential Mobile Crisis Unit telephone number  Request made of family/significant other to:  Remove weapons (e.g., guns, rifles, knives), all items previously/currently identified as safety concern.Husband advised patient does not have access to weapons.      Remove drugs/medications (over-the-counter, prescriptions, illicit drugs), all items previously/currently identified as a safety concern.  The family member/significant other verbalizes understanding of the suicide prevention education information provided.  The family member/significant other agrees to remove the items of safety concern listed above.  Natalie [name] 12/25/2013, 4:10 PM

## 2013-12-25 NOTE — Progress Notes (Signed)
D:  Patient's self inventory sheet, patient has fair sleep, improving appetite, high energy level, good attention span.  Rated depression and hopeless 1, denied anxiety.  Denied withdrawals.  Denied SI.  Denied physical problems.  Worst pain #1.  "Take my meds, go to all follow-up apts and use my coping skills positively.  I feel so much better. I am ready to be discharged."  Does have discharge plans.  No problems taking meds after discharge. A:  Medications administered per MD orders.  Emotional support and encouragement given patient. R:  Patient denied SI and HI.  Contracts for safety.  Denied A/V hallucinations.  Denied pain.  Will continue safety checks every 15 minutes.  Safety maintained.

## 2013-12-25 NOTE — Progress Notes (Signed)
Pt observed in the dayroom talking with peers.  Pt reports she is doing well this evening and feels somewhat more hopeful.  She says her husband is trying to find them another place to stay and feels this will help her be able to cope better and not feel so overwhelmed.  Pt asked about her sleep medication and said the dose she received last night did not last her all night.  Informed pt that she has a repeat dose she can take before 2 AM if she needs it.  Pt voiced understanding.  Pt denies SI/HI/AV at this time.  Pt makes her needs known to staff.  Discharge plans still in process.  Support and encouragement offered.  Safety maintained with q15 minute checks.

## 2013-12-25 NOTE — Progress Notes (Signed)
Pt attended spiritual care group on grief and loss facilitated by chaplain Burnis Kingfisher.  Group opened with brief discussion and psycho-social ed around grief and loss in relationships and in relation to self - identifying life patterns, circumstances, changes that cause losses. Established group norm of speaking from own life experience. Group goal of establishing open and affirming space for members to share loss and experience with grief, normalize grief experience and provide psycho social education and grief support.   Natalie [name] was present and attentive throughout group.  Affect was appropriate.  Did not contribute to group discussion.   Belva Crome MDiv

## 2013-12-25 NOTE — Progress Notes (Signed)
Recreation Therapy Notes  Date: 01.26.2015 Time: 2:45pm Location: [address] Day[address]   Group Topic: Wellness  Goal Area(s) Addresses:  Patient will identify dimension of wellness they most struggle with.  Patient will identify at least 2 ways to invest in that type of wellness.   Behavioral Response: Engaged, Appropriate    Intervention: Informational worksheet  Activity: Patients were provided a worksheet outlining the 6 dimensions of wellness - Physical, Emotional, Social, Emotional, Environmental, & Spiritual.  As a group patients were asked to identify examples of each dimension, individually patients were asked to identify at least 2 things they can do post d/c to invest in each dimension of wellness.   Education: Wellness, Discharge Planning  Education Outcome: Acknowledges understanding   Clinical Observations/Feedback: Patient engaged in activity, identifying areas of personal adjustment. Patient made no contributions to group discussion, but appeared to actively listen as she maintained appropriate eye contact with speaker.   Marykay Lex Brytnee Bechler, LRT/CTRS  Jearl Klinefelter 12/25/2013 4:47 PM

## 2013-12-26 MED ORDER — TRAZODONE HCL 150 MG PO TABS: 150.0000 mg | ORAL_TABLET | Freq: Every evening | ORAL | Status: DC | PRN

## 2013-12-26 MED ORDER — TRAZODONE HCL 150 MG PO TABS
150.0000 mg | ORAL_TABLET | Freq: Every day | ORAL | Status: DC
  Administered 2013-12-26: 150 mg via ORAL
  Filled 2013-12-26 (×2): qty 1
  Filled 2013-12-26: qty 14

## 2013-12-26 MED ORDER — HYDROXYZINE HCL 25 MG PO TABS
25.0000 mg | ORAL_TABLET | Freq: Every day | ORAL | Status: DC
  Administered 2013-12-26: 25 mg via ORAL
  Filled 2013-12-26 (×3): qty 1

## 2013-12-26 MED ORDER — LAMOTRIGINE 100 MG PO TABS
50.0000 mg | ORAL_TABLET | Freq: Two times a day (BID) | ORAL | Status: DC
  Administered 2013-12-26 – 2013-12-27 (×2): 50 mg via ORAL
  Filled 2013-12-26: qty 2
  Filled 2013-12-26: qty 14
  Filled 2013-12-26 (×2): qty 2
  Filled 2013-12-26: qty 14
  Filled 2013-12-26: qty 2

## 2013-12-26 NOTE — Progress Notes (Signed)
Adult Psychoeducational Group Note  Date:  12/26/2013 Time:  8:00 pm  Group Topic/Focus:  Wrap-Up Group:   The focus of this group is to help patients review their daily goal of treatment and discuss progress on daily workbooks.  Participation Level:  Active  Participation Quality:  Appropriate and Sharing  Affect:  Appropriate  Cognitive:  Appropriate  Insight: Appropriate  Engagement in Group:  Engaged  Modes of Intervention:  Discussion, Education, Socialization and Support  Additional Comments:  Pt stated that she has learned to control anger, stress and depression levels. PT stated that she is a real person, she is caring and not judgmental of others.   Laural Benes, Kanyon Seibold 12/26/2013, 10:24 PM

## 2013-12-26 NOTE — [facility] Group Notes (Signed)
[facility] LCSW Group Therapy      Feelings About Diagnosis 1:15 - 2:30 PM         12/26/2013  3:29 PM    Type of Therapy:  Group Therapy  Participation Level:  Active  Participation Quality:  Appropriate  Affect:  Appropriate  Cognitive:  Alert and Appropriate  Insight:  Developing/Improving and Engaged  Engagement in Therapy:  Developing/Improving and Engaged  Modes of Intervention:  Discussion, Education, Exploration, Problem-Solving, Rapport Building, Support  Summary of Progress/Problems:  Patient actively participated in group. Patient discussed past and present diagnosis and the effects it has had on  life.  Patient talked about family and society being judgmental and the stigma associated with having a mental health diagnosis. Patient shared she has Bipolar Disorder but it does not define who she is as a person.  Wynn Banker 12/26/2013  3:29 PM

## 2013-12-26 NOTE — Consult Note (Signed)
Face to face evaluation, note reviewed and agreed with 

## 2013-12-26 NOTE — Progress Notes (Signed)
Pt up at this time complaining that she woke up and cannot go back to sleep.  Pt was informed that sleep aids are not given after 0230.  When pt was given the first dose of Trazodone 100 mg, pt was told that she needed to come back in about an hour to get the repeat dose.  Pt was also told at that time that sleep aids are not given after 0230.  Pt did not return and when RN checked on her at 2230, pt was asleep.  At this time pt is irritable and says she wants to talk with MD first thing in the AM.  RN checked pt's MAR and found that she has Vistaril 25 mg q6h prn for agitation and sleep.  This med was pulled and taken to pt's room where it was offered to help her relax.  Pt refused to take the Vistaril, saying she was told "not to mix medicines and that she would not take it".  Tried to explain that the Vistaril was a medication that was on her list, but she still refused.  Medication was returned to the pyxis unopened.

## 2013-12-26 NOTE — Progress Notes (Signed)
The focus of this group is to educate the patient on the purpose and policies of crisis stabilization and provide a format to answer questions about their admission.  The group details unit policies and expectations of patients while admitted.  Patient did not attend 0900 nurse education orientation group this morning.  Patient stayed in bed.   

## 2013-12-26 NOTE — Progress Notes (Signed)
D:  Patient's self inventory sheet, patient has poor sleep, needs more sleep medication, poor appetite, high energy level, good attention span.  Denied depression, hopeless, anxiety.  Denied withdrawals.  Denied SI.  "Very little sleep.  Zero pain goal, zero pain.  "Take my meds, go to all doctor appts, and get a therapist."  No questions for staff.  Does have discharge plans.  No problems taking meds after discharge. A:  Medications administered per MD orders.  Emotional support and encouragement given patient. R:  Denied SI and HI.  Denied A/V hallucinations.  Denied pain.  Will continue to monitor patient for safety with 15 minute checks.  Safety maintained. Is looking forward to discharge tomorrow.  Pt looking forward to sleeping tonight without waking up.  Patient stated she does not have any other problems at this time.  Will follow treatment plans after discharge.

## 2013-12-26 NOTE — Progress Notes (Signed)
Patient ID: BRODIE SCOVELL, female   DOB: 07-07-1991, 23 y.o.   MRN: [id number] Patient ID: ZOYA SPRECHER, female   DOB: [birth year], 24 y.o.   MRN: [id number] [facility] MD Progress Note  12/26/2013 2:38 PM TIAWANA FORGY  MRN:  [id number]  Subjective:  She was admitted for depression with a suicidal attempt of taking overdose on multiple psychiatric prescription medication including lithium, buspar and latuda and she has been diagnosed with bipolar disorder.  Patient has been doing well with her medication Lamictal and trazodone. She has middle insomnia even though she taken trazodone 100 mg and staff asked her to come back if she can not sleep. She went back after two hours and no medication was given. She was offered different medication but she refused to take it. She requested to increase her medication trazodone 150 mg PO Qhs and had sleep of 4.25 hours. She has less depressed 1/10 and anxiety 1/10 and denied current suicidal ideations. She has regrets about overdose and says she is not going to do again and contract for safety. Patient husband has been supportive to her. She does not want her parents to know about her medical information because they are not supportive to her. Her husband found a place to live other than his mother's home and hoping to move into new place upon discharge. She is attending groups and compliant with her medication. She feels strong enough, learned coping skills to deal with her stresses even though if they can not find another place right away. Patient contracts for safety while in the hospital. Patient has plans to go to [facility] for outpatient psychiatric care.   Diagnosis:   DSM5: Schizophrenia Disorders:   Obsessive-Compulsive Disorders: OCD  Trauma-Stressor Disorders:   Substance/Addictive Disorders:   Depressive Disorders:  Major Depressive Disorder - Severe (296.23)  Axis I: Bipolar, Depressed and Major Depression, Recurrent severe Axis IV: housing  problems, problems with access to health care services and problems with primary support group Axis V: 31-40 impairment in reality testing  ADL's:  Intact  Sleep: Good  Appetite:  Good  Suicidal Ideation:  Plan:  Denies  Intent:  Denies Means:  Denies Homicidal Ideation:  Plan:  Denies Intent:  Denies Means:  Denies AEB (as evidenced by):  Psychiatric Specialty Exam: Review of Systems  Psychiatric/Behavioral: Positive for depression. Negative for suicidal ideas, hallucinations and substance abuse. The patient is nervous/anxious. The patient does not have insomnia.   All other systems reviewed and are negative.    Blood pressure 128/79, pulse 124, temperature 97.8 F (36.6 C), temperature source Oral, resp. rate 20, height 5\' 4"  (1.626 m), weight 108.41 kg (239 lb), last menstrual period 12/21/2013, SpO2 100.00%.Body mass index is 41 kg/(m^2).  General Appearance: Casual and Fairly Groomed, musculoskeletal functions are within normal limits   Eye Contact::  Good  Speech:  Clear and Coherent and Normal Rate, patient has intact language without difficulties   Volume:  Normal  Mood:  Euthymic  Affect:  Depressed and Flat  Thought Process:  Coherent  Orientation:  Full (Time, Place, and Person)  Thought Content:  WDL  Suicidal Thoughts:  No  Homicidal Thoughts:  No  Memory:  Immediate;   Good Recent;   Good Remote;   Good  Judgement:  Intact  Insight:  Fair  Psychomotor Activity:  Normal  Concentration:  Good, has normal fund of knowledge.   Recall:  Good  Akathisia:  No  Handed:  Right  AIMS (if indicated):  Assets:  Desire for Improvement Intimacy Physical Health Social Support  Sleep:  Number of Hours: 4.25   Current Medications: Current Facility-Administered Medications  Medication Dose Route Frequency Provider Last Rate Last Dose  . acetaminophen (TYLENOL) tablet 650 mg  650 mg Oral Q6H PRN Earney Navy, NP      . alum & mag hydroxide-simeth  (MAALOX/MYLANTA) 200-200-20 MG/5ML suspension 30 mL  30 mL Oral Q4H PRN Earney Navy, NP      . hydrOXYzine (ATARAX/VISTARIL) tablet 25 mg  25 mg Oral Q6H PRN Truman Hayward, FNP      . lamoTRIgine (LAMICTAL) tablet 25 mg  25 mg Oral BID Truman Hayward, FNP   25 mg at 12/26/13 4081  . magnesium hydroxide (MILK OF MAGNESIA) suspension 30 mL  30 mL Oral Daily PRN Earney Navy, NP      . traZODone (DESYREL) tablet 150 mg  150 mg Oral QHS PRN Beau Fanny, FNP        Lab Results:  No results found for this or any previous visit (from the past 48 hour(s)).  Physical Findings: AIMS: Facial and Oral Movements Muscles of Facial Expression: None, normal Lips and Perioral Area: None, normal Jaw: None, normal Tongue: None, normal,Extremity Movements Upper (arms, wrists, hands, fingers): None, normal Lower (legs, knees, ankles, toes): None, normal, Trunk Movements Neck, shoulders, hips: None, normal, Overall Severity Severity of abnormal movements (highest score from questions above): None, normal Incapacitation due to abnormal movements: None, normal Patient's awareness of abnormal movements (rate only patient's report): No Awareness, Dental Status Current problems with teeth and/or dentures?: No Does patient usually wear dentures?: No  CIWA:  CIWA-Ar Total: 1 COWS:     Treatment Plan Summary: Daily contact with patient to assess and evaluate symptoms and progress in treatment Medication management for bipolar disorder, mood swings, depression and recent suicide attempt  Plan:Treatment Plan/Recommendations:  1 Admit for crisis management and stabilization.  Estimated length of stay 5-7 days  Continue Lamictal 50 mg twice daily for mood swings  Increase trazodone 150 mg at bedtime for insomnia  Add Hydroxyzine 25 mg PO Qhs 2 Individual and group therapy. 3 Medication management for depression, and anxiety to reduce current symptoms to base line and improve the overall levels  of functioning: Medications reviewed with the patient and she stated no untoward effects, home medications in place.  4 Coping skills for depression and anxiety developing.  5 Continue crisis stabilization and management.  6 Address health issues- monitor vital signs, stable;  Lithium levels are trending downward-reviewed with patient.  7 Treatment plan in progress to prevent relapse prevention and self care.  8 Psychosocial education regarding relapse prevention and self care 9 Heath care follow up as needed for any health concerns 10 Call for consult with hospitalist for additional specialty patient services as needed.   Medical Decision Making Problem Points:  Established problem, stable/improving (1), Review of last therapy session (1) and Review of psycho-social stressors (1) Data Points:  Review or order clinical lab tests (1) Review or order medicine tests (1) Review of medication regiment & side effects (2) Review of new medications or change in dosage (2)  I certify that inpatient services furnished can reasonably be expected to improve the patient's condition.   Khristina Janota,JANARDHAHA R. 12/26/2013, 2:38 PM

## 2013-12-26 NOTE — Progress Notes (Signed)
Pt observed sitting in the dayroom watching TV and talking with her peers.  Pt has her sleep meds due at this time.  Pt was informed that her sleep med had been increased and that she would need to return before 0230 to be able to get the second dose.  This is the same information pt was given last night at bedtime when she took her hs meds.  Last night, pt took the first dose of Trazodone and went to sleep.  She did not come back up to the med window until about 0330 in the morning, not just a couple of hours as was stated in a previous note.  Pt refused to take the Vistaril 25 mg offered to her at that time.  Pt voices understanding of this tonight.  Pt denies SI/HI/AV.  Pt plans to return home at discharge which may be Wednesday if she has a good night.  Pt is aware of this.  Support and encouragement offered.  Safety maintained with q15 minute checks.

## 2013-12-26 NOTE — Progress Notes (Signed)
Recreation Therapy Notes  Animal-Assisted Activity/Therapy (AAA/T) Program Checklist/Progress Notes Patient Eligibility Criteria Checklist & Daily Group note for Rec Tx Intervention  Date: 01.27.2015 Time: 2:45pm Location: [address]    AAA/T Program Assumption of Risk Form signed by Patient/ or Parent Legal Guardian yes  Patient is free of allergies or sever asthma yes  Patient reports no fear of animals yes  Patient reports no history of cruelty to animals yes   Patient understands his/her participation is voluntary yes  Behavioral Response: Did not attend.   Marykay Lex Julietta Batterman, LRT/CTRS  Jearl Klinefelter 12/26/2013 5:41 PM

## 2013-12-27 MED ORDER — LAMOTRIGINE 25 MG PO TABS: 50.0000 mg | ORAL_TABLET | Freq: Two times a day (BID) | ORAL | Status: DC

## 2013-12-27 MED ORDER — HYDROXYZINE HCL 25 MG PO TABS: 25.0000 mg | ORAL_TABLET | Freq: Four times a day (QID) | ORAL | Status: DC | PRN

## 2013-12-27 MED ORDER — TRAZODONE HCL 150 MG PO TABS: 150.0000 mg | ORAL_TABLET | Freq: Every day | ORAL | Status: DC

## 2013-12-27 NOTE — Progress Notes (Signed)
[facility] Adult Case Management Discharge Plan :  Will you be returning to the same living situation after discharge: Yes,  Patient to discharge home with family. At discharge, do you have transportation home?:Yes,  Patient has transportation home. Do you have the ability to pay for your medications:Yes,  Patient is able to afford medications.  Release of information consent forms completed and in the chart;  Patient's signature needed at discharge.  Patient to Follow up at: Follow-up Information   Follow up with [facility] On 12/29/2013. (Please go to [facility]'s walk in clinic on Friday, December 29, 2013 or any weekday between 8AM-3PM for medication management and counseling)    Contact information:   [address] [place], [place]   [address]  [phone]      Patient denies SI/HI:   Patient no longer endorsing SI/HI or other thoughts of self harm.     Safety Planning and Suicide Prevention discussed:  .Reviewed with all patients during discharge planning group   Aava Deland, Joesph July 12/27/2013, 10:44 AM

## 2013-12-27 NOTE — Discharge Summary (Signed)
Physician Discharge Summary Note  Patient:  Natalie [name] is an 23 y.o., female MRN:  [id number]  DOB:  08/31/1991 Patient phone:  [phone] (home)  Patient address:   [address] [place] [address],   Date of Admission:  12/22/2013 Date of Discharge: 12/27/2013  Reason for Admission:  MDD, Suicidal Ideation   Discharge Diagnoses: Active Problems:   Bipolar affective disorder, current episode depressed  Review of Systems  Constitutional: Negative.   HENT: Negative.   Eyes: Negative.   Respiratory: Negative.   Cardiovascular: Negative.   Gastrointestinal: Negative.   Genitourinary: Negative.   Musculoskeletal: Negative.   Skin: Negative.   Neurological: Negative.   Endo/Heme/Allergies: Negative.   Psychiatric/Behavioral: Negative.    DSM5: Trauma-Stressor Disorders:  Posttraumatic Stress Disorder (309.81) Substance/Addictive Disorders:  Denies Depressive Disorders:  Major Depressive Disorder - Severe (296.23)  Axis Diagnosis:   AXIS I:  Bipolar, Depressed AXIS II:  Deferred AXIS III:   Past Medical History  Diagnosis Date  . Psychiatric diagnosis   . Chronic bipolar disorder   . OCD (obsessive compulsive disorder)    AXIS IV:  other psychosocial or environmental problems and problems related to social environment AXIS V:  61-70 mild symptoms  Level of Care:  OP  Hospital Course:   Natalie [name] is a 24 y.o. female patient who presented to [facility] after taking pills in a suicide attempt. Patient states that over the course of the past 3 days her husband has been hospitalized for stroke like symptoms 2/t diabetes. States that she started "to feel scared for him." States she was going to have a lot of doctor's appointments and "I could not deal with it." This evening she and her husband got into an argument with her father and patient states "my mind broke." States she went into the closet where her medications were kept, turned her back to her  husband so he couldn't see what she was doing and she took her medications. Patient states "I knew what I was doing. I know the side effects of the medications and what they can do." Patient states that she took 10 or more Lithium tabs, unknown amount of Buspar, 2 tabs of Latuda and a few multivitamins. Patient admits that she was trying to kill herself, but her husband stopped. States that she came to the hospital voluntarily "because I need help."   Patient states that last year her husband lost his job at [employer] and "we lost everything." States that since early 2014 she and her husband have been living with her mother-in-law. States that she has been depressed over her living situation. She states that she does not have a good relationship with her mother-in-law. Patient admits to feeling hopeless. State "I have been experiencing anger issues, thoughts of accusing people of things, irritability and being paranoid." Patient states she was diagnosed with bipolar when she was 14. Patient was hospitalized at [facility] in 2007 and 2008 but states she cannot remember the reason for the admissions. Patient also reports that she overdosed in her early 20s but does not remember where she was hospitalized. Patient states that she has been followed at [facility] since her bipolar diagnosis. States she sees Dr. Deatra Robinson at [facility]; last visit was December, 2014 and that she sees her monthly. Patient states she feels like her medications need to be adjusted. States that before starting Latuda she was on Zyprexa but was taken off Zyprexa due to "severe weight gain" of "30-40 lbs every  2 weeks."     During Hospitalization: Medications managed, psychoeducation, group and individual therapy. Pt currently denies SI, HI, and Psychosis. At discharge, pt minimizes depression and anxiety, reports major improvement in sleep quality and quantity. Pt states that he does have a good supportive home environment and will followup with  outpatient treatment. Affirms agreement with medication regimen and discharge plan. Denies other physical and psychological concerns at time of discharge.   Consults:  None  Significant Diagnostic Studies:  None  Discharge Vitals:   Blood pressure 132/84, pulse 120, temperature 98.2 F (36.8 C), temperature source Oral, resp. rate 16, height 5\' 4"  (1.626 m), weight 108.41 kg (239 lb), last menstrual period 12/21/2013, SpO2 100.00%. Body mass index is 41 kg/(m^2). Lab Results:   No results found for this or any previous visit (from the past 72 hour(s)).  Physical Findings: AIMS: Facial and Oral Movements Muscles of Facial Expression: None, normal Lips and Perioral Area: None, normal Jaw: None, normal Tongue: None, normal,Extremity Movements Upper (arms, wrists, hands, fingers): None, normal Lower (legs, knees, ankles, toes): None, normal, Trunk Movements Neck, shoulders, hips: None, normal, Overall Severity Severity of abnormal movements (highest score from questions above): None, normal Incapacitation due to abnormal movements: None, normal Patient's awareness of abnormal movements (rate only patient's report): No Awareness, Dental Status Current problems with teeth and/or dentures?: No Does patient usually wear dentures?: No  CIWA:  CIWA-Ar Total: 1 COWS:  COWS Total Score: 3  Psychiatric Specialty Exam: See Psychiatric Specialty Exam and Suicide Risk Assessment completed by Attending Physician prior to discharge.  Discharge destination:  Home  Is patient on multiple antipsychotic therapies at discharge:  No   Has Patient had three or more failed trials of antipsychotic monotherapy by history:  No  Recommended Plan for Multiple Antipsychotic Therapies: NA     Medication List    ASK your doctor about these medications     Indication   busPIRone 10 MG tablet  Commonly known as:  BUSPAR  Take 5 mg by mouth 2 (two) times daily.      LATUDA 20 MG Tabs  Generic drug:   Lurasidone HCl  Take 1 tablet by mouth every evening.      lithium carbonate 300 MG capsule  Take 600-900 mg by mouth 2 (two) times daily with a meal. 600 mg in am and 900 mg in pm      multivitamin with minerals Tabs tablet  Take 1 tablet by mouth daily.            Follow-up Information   Follow up with [facility] On 12/29/2013. (Please go to [facility]'s walk in clinic on Friday, December 29, 2013 or any weekday between 8AM-3PM for medication management and counseling)    Contact information:   [address] [place], [place]   [address]  [phone]      Follow-up recommendations:  Activity:  As tolerated Diet:  Heart healthy with low sodium.   Comments:   Take all medications as prescribed. Keep all follow-up appointments as scheduled.  Do not consume alcohol or use illegal drugs while on prescription medications. Report any adverse effects from your medications to your primary care provider promptly.  In the event of recurrent symptoms or worsening symptoms, call 911, a crisis hotline, or go to the nearest emergency department for evaluation.    Total Discharge Time:  Greater than 30 minutes.  Signed: Beau Fanny, FNP-BC 12/27/2013, 10:57 AM  Patient was seen for psychiatric evaluation, suicide  risk assessment and case discussed with the treatment team and a physician extender. Made disposition plan and Reviewed the information documented and agree with the treatment plan.  Zorawar Strollo,JANARDHAHA R. 12/27/2013 12:51 PM

## 2013-12-27 NOTE — [facility] Group Notes (Signed)
[facility] LCSW Aftercare Discharge Planning Group Note   12/27/2013 9:50 AM    Participation Quality:  Appropraite  Mood/Affect:  Appropriate  Depression Rating:  0  Anxiety Rating:  0  Thoughts of Suicide:  No  Will you contract for safety?   NA  Current AVH:  No  Plan for Discharge/Comments:  Patient attended discharge planning group and actively participated in group.  Patient reports medications are working and she is ready to discharge home.  She will follow up with [facility].  CSW provided all participants with daily workbook.   Transportation Means: Patient has transportation.   Supports:  Patient has a support system.   Ruthetta Koopmann, Joesph July

## 2013-12-27 NOTE — Progress Notes (Signed)
Adult Psychoeducational Group Note  Date:  12/27/2013 Time:  2:27 PM  Group Topic/Focus:  Personal Choices and Values:   The focus of this group is to help patients assess and explore the importance of values in their lives, how their values affect their decisions, how they express their values and what opposes their expression.  Participation Level:  Minimal  Participation Quality:  Attentive  Affect:  Appropriate  Cognitive:  Appropriate  Insight: Appropriate  Engagement in Group:  Poor  Modes of Intervention:  Discussion  Additional Comments:  Pt. Attend did not interact and engage much  Tonita Cong 12/27/2013, 2:27 PM

## 2013-12-27 NOTE — Progress Notes (Addendum)
D:  Patient's self inventory sheet, patient sleeps well, good appetite, high energy level, good attention span.  Denied depression and hopelessness.  Denied pain.  Denied SI.  Denied physical problems.  Denied pain, zero pain goal.  "Take meds on time an regularly, go to doctor's apts, ignore stress factors.  I THANK MY GREAT DOCTOR.  THANK YOU TO ALL THOSE [facility]INC[facility] STAFF MEMBERS THAT TOOK CARE OF ME."  Does have discharge plans.  No problems taking meds after discharge. A:  Medications administered per MD orders.  Emotional support and encouragement given patient. R:  Denied SI and HI.  Denied A/V hallucinations.  Denied pain.  Will continue to monitor patient for safety with 15 minute checks.  Safety maintained.  "I feel excellent on my meds.  Will follow discharge plan."  Slept all night, ready for discharge.

## 2013-12-27 NOTE — [facility] Suicide Risk Assessment (Signed)
Suicide Risk Assessment  Discharge Assessment     Demographic Factors:  Adolescent or young adult and Low socioeconomic status  Mental Status Per Nursing Assessment::   On Admission:  Suicide plan;Self-harm thoughts;Self-harm behaviors  Current Mental Status by Physician: Patient is calm and cooperative. Patient has normal psychomotor activity. Patient has no musculoskeletal abnormalities. Patient has normal rate, rhythm and volume of speech. Patient has no suicidal or homicidal ideation, intention or plan. Patient has a fair insight judgment and impulse control.  Loss Factors: Financial problems/change in socioeconomic status  Historical Factors: Noncompliant with medication  Risk Reduction Factors:   Sense of responsibility to family, Religious beliefs about death, Living with another person, especially a relative, Positive social support, Positive therapeutic relationship and Positive coping skills or problem solving skills  Continued Clinical Symptoms:  Bipolar Disorder:   Depressive phase Obsessive-Compulsive Disorder Previous Psychiatric Diagnoses and Treatments Medical Diagnoses and Treatments/Surgeries  Cognitive Features That Contribute To Risk:  Polarized thinking    Suicide Risk:  Minimal: No identifiable suicidal ideation.  Patients presenting with no risk factors but with morbid ruminations; may be classified as minimal risk based on the severity of the depressive symptoms  Discharge Diagnoses:   AXIS I:  Bipolar, Depressed AXIS II:  Deferred AXIS III:   Past Medical History  Diagnosis Date  . Psychiatric diagnosis   . Chronic bipolar disorder   . OCD (obsessive compulsive disorder)    AXIS IV:  other psychosocial or environmental problems, problems related to social environment and problems with primary support group AXIS V:  61-70 mild symptoms  Plan Of Care/Follow-up recommendations:  Activity: As tolerated Diet: Regular  Is patient on multiple  antipsychotic therapies at discharge:  No   Has Patient had three or more failed trials of antipsychotic monotherapy by history:  No  Recommended Plan for Multiple Antipsychotic Therapies: NA  Natalie [name],Natalie [name]. 12/27/2013, 12:10 PM

## 2013-12-27 NOTE — Progress Notes (Signed)
Pt. Was ready for discharge when writer arrived this pm.  Writer went over discharge summary and medications and dosages with the pt.  Pt. Denies SI and HI.  Reports that she feels safe and ready to go home.  Pt.'s belongings were returned to the pt.Marland Kitchen Spouse was waiting in the Lobby. Pt. Was safely discharged from [facility].

## 2013-12-27 NOTE — Tx Team (Signed)
Interdisciplinary Treatment Plan Update   Date Reviewed:  12/27/2013  Time Reviewed:  10:38 AM  Progress in Treatment:   Attending groups: Yes Participating in groups: Yes Taking medication as prescribed: Yes  Tolerating medication: Yes Family/Significant other contact made: Yes, collateral contact with mother. Patient understands diagnosis: Yes  Discussing patient identified problems/goals with staff: Yes Medical problems stabilized or resolved: Yes Denies suicidal/homicidal ideation: Yes Patient has not harmed self or others: Yes  For review of initial/current patient goals, please see plan of care.  Estimated Length of Stay:  Discharge today  Reasons for Continued Hospitalization:   New Problems/Goals identified:    Discharge Plan or Barriers:   Home with outpatient follow up  Additional Comments:    Attendees:  Patient:   Natalie [name] 12/27/2013 10:38 AM   Signature: Mervyn Gay, MD 12/27/2013 10:38 AM  Signature:   12/27/2013 10:38 AM  Signature:  Claudette Head, NP 12/27/2013 10:38 AM  Signature:Beverly Terrilee Croak, RN 12/27/2013 10:38 AM  Signature:  Aloha Gell, RN 12/27/2013 10:38 AM  Signature:  Juline Patch, LCSW 12/27/2013 10:38 AM  Signature:  Reyes Ivan, LCSW 12/27/2013 10:38 AM  Signature:  Leisa Lenz, Care Coordinator 12/27/2013 10:38 AM  Signature:  Harold Barban, RN 12/27/2013 10:38 AM  Signature:  12/27/2013  10:38 AM  Signature:   Onnie Boer, RN [facility] 12/27/2013  10:38 AM  Signature:  12/27/2013  10:38 AM    Scribe for Treatment Team:   Juline Patch,  12/27/2013 10:38 AM

## 2014-01-01 NOTE — Progress Notes (Signed)
Patient Discharge Instructions:  After Visit Summary (AVS):   Faxed to:  01/01/14 Discharge Summary Note:   Faxed to:  01/01/14 Psychiatric Admission Assessment Note:   Faxed to:  01/01/14 Suicide Risk Assessment - Discharge Assessment:   Faxed to:  01/01/14 Faxed/Sent to the Next Level Care provider:  01/01/14 Faxed to [facility] @ [phone]   Jerelene Redden, 01/01/2014, 4:14 PM

## 2014-02-13 ENCOUNTER — Emergency Department (HOSPITAL_COMMUNITY)
Admission: EM | Admit: 2014-02-13 | Discharge: 2014-02-14 | Disposition: A | Discharge status: Home / Self Care | Payer: No Typology Code available for payment source | Attending: Emergency Medicine | Admitting: Emergency Medicine

## 2014-02-13 ENCOUNTER — Encounter (HOSPITAL_COMMUNITY): Payer: Self-pay | Admitting: Emergency Medicine

## 2014-02-13 DIAGNOSIS — F429 Obsessive-compulsive disorder, unspecified: Secondary | ICD-10-CM | POA: Insufficient documentation

## 2014-02-13 DIAGNOSIS — R8281 Pyuria: Secondary | ICD-10-CM

## 2014-02-13 DIAGNOSIS — F489 Nonpsychotic mental disorder, unspecified: Secondary | ICD-10-CM | POA: Insufficient documentation

## 2014-02-13 DIAGNOSIS — N898 Other specified noninflammatory disorders of vagina: Secondary | ICD-10-CM | POA: Insufficient documentation

## 2014-02-13 DIAGNOSIS — F319 Bipolar disorder, unspecified: Secondary | ICD-10-CM | POA: Insufficient documentation

## 2014-02-13 DIAGNOSIS — R82998 Other abnormal findings in urine: Secondary | ICD-10-CM | POA: Insufficient documentation

## 2014-02-13 DIAGNOSIS — R300 Dysuria: Secondary | ICD-10-CM | POA: Insufficient documentation

## 2014-02-13 DIAGNOSIS — Z79899 Other long term (current) drug therapy: Secondary | ICD-10-CM | POA: Insufficient documentation

## 2014-02-13 LAB — URINALYSIS, ROUTINE W REFLEX MICROSCOPIC
BILIRUBIN URINE: NEGATIVE
GLUCOSE, UA: NEGATIVE mg/dL
Hgb urine dipstick: NEGATIVE
KETONES UR: NEGATIVE mg/dL
Nitrite: NEGATIVE
PH: 6 (ref 5.0–8.0)
Protein, ur: NEGATIVE mg/dL
Specific Gravity, Urine: 1.012 (ref 1.005–1.030)
Urobilinogen, UA: 0.2 mg/dL (ref 0.0–1.0)

## 2014-02-13 LAB — URINE MICROSCOPIC-ADD ON

## 2014-02-13 LAB — POC URINE PREG, ED: PREG TEST UR: NEGATIVE

## 2014-02-13 NOTE — ED Provider Notes (Signed)
CSN: [id number]     Arrival date & time 02/13/14  1952 History   None    Chief Complaint  Patient presents with  . Urinary Frequency     (Consider location/radiation/quality/duration/timing/severity/associated sxs/prior Treatment) HPI Comments: Patient is a 23 year old female past medical history significant for chronic bipolar disorder, OCD, psychiatric diagnosis presented to the emergency department for 2 days of vaginal itching with a scant white vaginal discharge, urinary frequency and dysuria. Patient states that she has had previous urinary tract infections and this feels similar to her previous infections. She states she is attempted to take one dose of age with no improvement of her symptoms. She denies any aggravating factors appear She denies any abdominal pain, nausea, vomiting, diarrhea, vaginal bleeding. Patient denies any recent attempts at sexual intercourse with a partner of unknown status.   Past Medical History  Diagnosis Date  . Psychiatric diagnosis   . Chronic bipolar disorder   . OCD (obsessive compulsive disorder)    History reviewed. No pertinent past surgical history. History reviewed. No pertinent family history. History  Substance Use Topics  . Smoking status: Never Smoker   . Smokeless tobacco: Not on file  . Alcohol Use: No   OB History   Grav Para Term Preterm Abortions TAB SAB Ect Mult Living                 Review of Systems  Constitutional: Negative for fever and chills.  Gastrointestinal: Negative for nausea, vomiting, abdominal pain and diarrhea.  Genitourinary: Positive for dysuria, frequency and vaginal discharge.  All other systems reviewed and are negative.      Allergies  Review of patient's allergies indicates no known allergies.  Home Medications   Current Outpatient Rx  Name  Route  Sig  Dispense  Refill  . lamoTRIgine (LAMICTAL) 25 MG tablet   Oral   Take 50 mg by mouth 2 (two) times daily.         . traZODone  (DESYREL) 150 MG tablet   Oral   Take 1 tablet (150 mg total) by mouth at bedtime.   30 tablet   0   . ciprofloxacin (CIPRO) 500 MG tablet   Oral   Take 1 tablet (500 mg total) by mouth 2 (two) times daily.   6 tablet   0   . phenazopyridine (PYRIDIUM) 200 MG tablet   Oral   Take 1 tablet (200 mg total) by mouth 3 (three) times daily.   6 tablet   0    BP 111/57  Pulse 91  Temp(Src) 97.1 F (36.2 C) (Oral)  Resp 16  Ht 5\' 4"  (1.626 m)  Wt 240 lb (108.863 kg)  BMI 41.18 kg/m2  SpO2 97% Physical Exam  Nursing note and vitals reviewed. Constitutional: She is oriented to person, place, and time. She appears well-developed and well-nourished. No distress.  HENT:  Head: Normocephalic and atraumatic.  Right Ear: External ear normal.  Left Ear: External ear normal.  Nose: Nose normal.  Mouth/Throat: Oropharynx is clear and moist.  Eyes: Conjunctivae are normal.  Neck: Normal range of motion. Neck supple.  Cardiovascular: Normal rate, regular rhythm and normal heart sounds.   Pulmonary/Chest: Effort normal and breath sounds normal. No respiratory distress.  Abdominal: Soft. Bowel sounds are normal. She exhibits no distension. There is no tenderness. There is no rebound and no guarding.  Musculoskeletal: Normal range of motion.  Neurological: She is alert and oriented to person, place, and time.  Skin: Skin  is warm and dry. She is not diaphoretic.  Psychiatric: She has a normal mood and affect.    Exam performed by Francee Piccolo L,  exam chaperoned Date: 02/14/2014 Pelvic exam: normal external genitalia without evidence of trauma. VULVA: normal appearing vulva with no masses, tenderness or lesion. VAGINA: normal appearing vagina with normal color and discharge, no lesions. CERVIX: normal appearing cervix without lesions, cervical motion tenderness absent, cervical os closed with out purulent discharge; vaginal discharge - white and curd-like, Wet prep and DNA probe  for chlamydia and GC obtained.   ADNEXA: normal adnexa in size, nontender and no masses UTERUS: uterus is normal size, shape, consistency and nontender.    ED Course  Procedures (including critical care time) Medications - No data to display  Labs Review Labs Reviewed  WET PREP, GENITAL - Abnormal; Notable for the following:    WBC, Wet Prep HPF POC FEW (*)    All other components within normal limits  URINALYSIS, ROUTINE W REFLEX MICROSCOPIC - Abnormal; Notable for the following:    Leukocytes, UA TRACE (*)    All other components within normal limits  URINE MICROSCOPIC-ADD ON - Abnormal; Notable for the following:    Squamous Epithelial / LPF FEW (*)    All other components within normal limits  GC/CHLAMYDIA PROBE AMP  HIV ANTIBODY (ROUTINE TESTING)  POC URINE PREG, ED   Imaging Review No results found.   EKG Interpretation None      MDM   Final diagnoses:  Vaginal discharge  Pyuria    Filed Vitals:   02/14/14 0115  BP: 111/57  Pulse: 91  Temp:   Resp:     Afebrile, NAD, non-toxic appearing, AAOx4. Patient with 2 days of dysuria, vaginal itching, vaginal discharge. Abdomen soft, nontender, nondistended. No peritoneal signs. Pelvic exam reveals no cervical motion tenderness, no adnexal fullness or tenderness. White discharge appreciated on exam. Urine reviewed. Mild polyuria noted. Nitrate negative. Few white blood cells on wet prep. Patient is at low risk and has little concern for sexually transmitted diseases. Will not treat prophylactically at this time. Will prescribe a three-day course of Cipro and peridium for symptoms. Return precautions discussed. Patient impression patient stable at time of discharge.     Jeannetta Ellis, PA-C 02/14/14 [phone]

## 2014-02-13 NOTE — ED Notes (Signed)
This RN walked escorted patient from waiting room to A7. When pt and patient family member got to room they discussed with me "how ridiculous the wait time has been." They also stated that they want to see an MD and "we want to talk to a director about why we had to wait so long." Patient would not describe her symptoms due to "I am highly irritated and not sure if I even have any symptoms anymore because I sat in the waiting room forever." Pt refused to sit in bed and also refused to change into a gown.

## 2014-02-13 NOTE — ED Notes (Signed)
Pt reports vaginal itching, polyuria, and dysuria x 2 days. Reports home test stated that she had an UTI.

## 2014-02-14 LAB — HIV ANTIBODY (ROUTINE TESTING W REFLEX): HIV: NONREACTIVE

## 2014-02-14 LAB — WET PREP, GENITAL
Clue Cells Wet Prep HPF POC: NONE SEEN
TRICH WET PREP: NONE SEEN
YEAST WET PREP: NONE SEEN

## 2014-02-14 LAB — GC/CHLAMYDIA PROBE AMP
CT Probe RNA: NEGATIVE
GC Probe RNA: NEGATIVE

## 2014-02-14 MED ORDER — PHENAZOPYRIDINE HCL 200 MG PO TABS: 200.0000 mg | ORAL_TABLET | Freq: Three times a day (TID) | ORAL | Status: DC

## 2014-02-14 MED ORDER — CIPROFLOXACIN HCL 500 MG PO TABS: 500.0000 mg | ORAL_TABLET | Freq: Two times a day (BID) | ORAL | Status: DC

## 2014-02-14 NOTE — Discharge Instructions (Signed)
Please follow up with your primary care physician in 1-2 days. If you do not have one please call the [facility] and wellness Center number listed above. Please take pyridium to help with urinary symptoms. You may be at the start of a urine infection, please take antibiotics as prescribed. Please read all discharge instructions and return precautions.   Urinary Tract Infection Urinary tract infections (UTIs) can develop anywhere along your urinary tract. Your urinary tract is your body's drainage system for removing wastes and extra water. Your urinary tract includes two kidneys, two ureters, a bladder, and a urethra. Your kidneys are a pair of bean-shaped organs. Each kidney is about the size of your fist. They are located below your ribs, one on each side of your spine. CAUSES Infections are caused by microbes, which are microscopic organisms, including fungi, viruses, and bacteria. These organisms are so small that they can only be seen through a microscope. Bacteria are the microbes that most commonly cause UTIs. SYMPTOMS  Symptoms of UTIs may vary by age and gender of the patient and by the location of the infection. Symptoms in young women typically include a frequent and intense urge to urinate and a painful, burning feeling in the bladder or urethra during urination. Older women and men are more likely to be tired, shaky, and weak and have muscle aches and abdominal pain. A fever may mean the infection is in your kidneys. Other symptoms of a kidney infection include pain in your back or sides below the ribs, nausea, and vomiting. DIAGNOSIS To diagnose a UTI, your caregiver will ask you about your symptoms. Your caregiver also will ask to provide a urine sample. The urine sample will be tested for bacteria and white blood cells. White blood cells are made by your body to help fight infection. TREATMENT  Typically, UTIs can be treated with medication. Because most UTIs are caused by a bacterial  infection, they usually can be treated with the use of antibiotics. The choice of antibiotic and length of treatment depend on your symptoms and the type of bacteria causing your infection. HOME CARE INSTRUCTIONS  If you were prescribed antibiotics, take them exactly as your caregiver instructs you. Finish the medication even if you feel better after you have only taken some of the medication.  Drink enough water and fluids to keep your urine clear or pale yellow.  Avoid caffeine, tea, and carbonated beverages. They tend to irritate your bladder.  Empty your bladder often. Avoid holding urine for long periods of time.  Empty your bladder before and after sexual intercourse.  After a bowel movement, women should cleanse from front to back. Use each tissue only once. SEEK MEDICAL CARE IF:   You have back pain.  You develop a fever.  Your symptoms do not begin to resolve within 3 days. SEEK IMMEDIATE MEDICAL CARE IF:   You have severe back pain or lower abdominal pain.  You develop chills.  You have nausea or vomiting.  You have continued burning or discomfort with urination. MAKE SURE YOU:   Understand these instructions.  Will watch your condition.  Will get help right away if you are not doing well or get worse. Document Released: 08/26/2005 Document Revised: 05/17/2012 Document Reviewed: 12/25/2011 [facility] Patient Information 2014 [place], [place].

## 2014-02-14 NOTE — ED Notes (Signed)
PA at bedside.

## 2014-02-16 NOTE — ED Provider Notes (Signed)
Medical screening examination/treatment/procedure(s) were performed by non-physician practitioner and as supervising physician I was immediately available for consultation/collaboration.   Hurman Horn, MD 02/16/14 [phone]

## 2014-05-26 ENCOUNTER — Emergency Department (HOSPITAL_COMMUNITY)
Admission: EM | Admit: 2014-05-26 | Discharge: 2014-05-29 | Disposition: A | Discharge status: Other Acute Inpatient Hospital | Payer: Medicaid Other | Attending: Emergency Medicine | Admitting: Emergency Medicine

## 2014-05-26 ENCOUNTER — Encounter (HOSPITAL_COMMUNITY): Payer: Self-pay | Admitting: Emergency Medicine

## 2014-05-26 DIAGNOSIS — F429 Obsessive-compulsive disorder, unspecified: Secondary | ICD-10-CM | POA: Insufficient documentation

## 2014-05-26 DIAGNOSIS — IMO0002 Reserved for concepts with insufficient information to code with codable children: Secondary | ICD-10-CM | POA: Diagnosis not present

## 2014-05-26 DIAGNOSIS — G478 Other sleep disorders: Secondary | ICD-10-CM | POA: Insufficient documentation

## 2014-05-26 DIAGNOSIS — F3130 Bipolar disorder, current episode depressed, mild or moderate severity, unspecified: Secondary | ICD-10-CM | POA: Diagnosis not present

## 2014-05-26 DIAGNOSIS — F319 Bipolar disorder, unspecified: Secondary | ICD-10-CM

## 2014-05-26 DIAGNOSIS — F911 Conduct disorder, childhood-onset type: Secondary | ICD-10-CM | POA: Insufficient documentation

## 2014-05-26 DIAGNOSIS — Z79899 Other long term (current) drug therapy: Secondary | ICD-10-CM | POA: Insufficient documentation

## 2014-05-26 DIAGNOSIS — F329 Major depressive disorder, single episode, unspecified: Secondary | ICD-10-CM | POA: Diagnosis present

## 2014-05-26 DIAGNOSIS — F3289 Other specified depressive episodes: Secondary | ICD-10-CM | POA: Diagnosis present

## 2014-05-26 HISTORY — DX: Personal history of self-harm: Z91.5

## 2014-05-26 LAB — CBC WITH DIFFERENTIAL/PLATELET
BASOS PCT: 0 % (ref 0–1)
Basophils Absolute: 0.1 10*3/uL (ref 0.0–0.1)
EOS ABS: 0 10*3/uL (ref 0.0–0.7)
Eosinophils Relative: 0 % (ref 0–5)
HCT: 38.5 % (ref 36.0–46.0)
HEMOGLOBIN: 12.6 g/dL (ref 12.0–15.0)
Lymphocytes Relative: 23 % (ref 12–46)
Lymphs Abs: 2.6 10*3/uL (ref 0.7–4.0)
MCH: 30.7 pg (ref 26.0–34.0)
MCHC: 32.7 g/dL (ref 30.0–36.0)
MCV: 93.7 fL (ref 78.0–100.0)
MONO ABS: 0.6 10*3/uL (ref 0.1–1.0)
MONOS PCT: 5 % (ref 3–12)
NEUTROS ABS: 7.9 10*3/uL — AB (ref 1.7–7.7)
Neutrophils Relative %: 72 % (ref 43–77)
Platelets: 220 10*3/uL (ref 150–400)
RBC: 4.11 MIL/uL (ref 3.87–5.11)
RDW: 14.8 % (ref 11.5–15.5)
WBC: 11.2 10*3/uL — ABNORMAL HIGH (ref 4.0–10.5)

## 2014-05-26 LAB — COMPREHENSIVE METABOLIC PANEL
ALT: 81 U/L — ABNORMAL HIGH (ref 0–35)
AST: 57 U/L — ABNORMAL HIGH (ref 0–37)
Albumin: 4.6 g/dL (ref 3.5–5.2)
Alkaline Phosphatase: 116 U/L (ref 39–117)
BUN: 9 mg/dL (ref 6–23)
CHLORIDE: 98 meq/L (ref 96–112)
CO2: 25 mEq/L (ref 19–32)
CREATININE: 0.71 mg/dL (ref 0.50–1.10)
Calcium: 9.8 mg/dL (ref 8.4–10.5)
GFR calc Af Amer: >90 mL/min (ref >90)
GFR calc non Af Amer: >90 mL/min (ref >90)
Glucose, Bld: 90 mg/dL (ref 70–99)
Potassium: 4.1 mEq/L (ref 3.7–5.3)
Sodium: 140 mEq/L (ref 137–147)
Total Bilirubin: 0.3 mg/dL (ref 0.3–1.2)
Total Protein: 8.2 g/dL (ref 6.0–8.3)

## 2014-05-26 LAB — RAPID URINE DRUG SCREEN, HOSP PERFORMED
Amphetamines: NOT DETECTED
BENZODIAZEPINES: NOT DETECTED
Barbiturates: NOT DETECTED
Cocaine: NOT DETECTED
Opiates: NOT DETECTED
Tetrahydrocannabinol: NOT DETECTED

## 2014-05-26 LAB — SALICYLATE LEVEL

## 2014-05-26 LAB — CARBAMAZEPINE LEVEL, TOTAL: Carbamazepine Lvl: 4.7 ug/mL (ref 4.0–12.0)

## 2014-05-26 LAB — ETHANOL

## 2014-05-26 LAB — VALPROIC ACID LEVEL

## 2014-05-26 LAB — ACETAMINOPHEN LEVEL

## 2014-05-26 NOTE — ED Notes (Signed)
Security called to wand patient 

## 2014-05-26 NOTE — ED Notes (Addendum)
Family states pt has started depakote recently and has been having "catatonic" symptoms according to parents. Also states trazodone was recently increased. C/o frequent disorientation. Parents state that patient was standing completely still, crying today. States that angry emotions started upon arrival to ED today.

## 2014-05-26 NOTE — ED Provider Notes (Signed)
Patient has history of bipolar disorder. On exam. She will not answer my questions. She has poor eye contact. She moves all extremities spontaneously.  Doug Sou, MD 05/26/14 2329

## 2014-05-26 NOTE — ED Provider Notes (Signed)
CSN: [id number]     Arrival date & time 05/26/14  1804 History   First MD Initiated Contact with Patient 05/26/14 2140     Chief Complaint  Patient presents with  . Depression     (Consider location/radiation/quality/duration/timing/severity/associated sxs/prior Treatment) Patient is a 23 y.o. female presenting with mental health disorder. The history is provided by the patient and a parent.  Mental Health Problem Presenting symptoms: agitation and bizarre behavior   Degree of incapacity (severity):  Severe Onset quality:  Gradual Timing:  Constant Progression:  Worsening Chronicity:  Chronic Worsened by:  Nothing tried Ineffective treatments: multiple medications.   23 yo F h/o bipolar d/o pw "I'm angry". History limited 2/2 patient cooperation. States she feels angry. Uncertain why. Does not like her medications. Denies SI/HI/AH/VH.   9:50 PM Discussed with mother Mrs. Renard Hamper. Acting "catonic". Not responding to anybody (new today). Crying for extended periods. Standing in place for hours staring at things. Not eating. OCD "acting up". Recurrently zipping purse. Disoriented. Is "always in a rage". Recently started on carbamazepine and increased dosing of trazadone. Uncertain why carbamazepine was started. Reportedly attempted suicide as recently as February. About 3-4 weeks ago. Dr. June Leap, [place]  is psychiatrist. Been acting this way since February. Although just moved in with parents one month ago. At home saying people are talking to her. Mother believes patient is hearing voices. Difficulty sleeping at nights chronically. Last seen psychiatry ~2 days ago.  Past Medical History  Diagnosis Date  . Psychiatric diagnosis   . Chronic bipolar disorder   . OCD (obsessive compulsive disorder)    History reviewed. No pertinent past surgical history. No family history on file. History  Substance Use Topics  . Smoking status: Never Smoker   . Smokeless tobacco: Not on file  .  Alcohol Use: No   OB History   Grav Para Term Preterm Abortions TAB SAB Ect Mult Living                 Review of Systems  Unable to perform ROS: Psychiatric disorder  Psychiatric/Behavioral: Positive for agitation.      Allergies  Review of patient's allergies indicates no known allergies.  Home Medications   Prior to Admission medications   Medication Sig Start Date End Date Taking? Authorizing Provider  carbamazepine (EPITOL) 200 MG tablet Take 200 mg by mouth 2 (two) times daily.   Yes Historical Provider, MD  Divalproex Sodium (DEPAKOTE PO) Take 1 capsule by mouth daily.   Yes Historical Provider, MD  hydrOXYzine (ATARAX/VISTARIL) 25 MG tablet Take 25 mg by mouth at bedtime.   Yes Historical Provider, MD  lamoTRIgine (LAMICTAL) 150 MG tablet Take 150 mg by mouth every morning.   Yes Historical Provider, MD  traZODone (DESYREL) 100 MG tablet Take 200 mg by mouth at bedtime.   Yes Historical Provider, MD   BP 129/77  Pulse 92  Temp(Src) 98.7 F (37.1 C) (Oral)  Resp 18  SpO2 96% Physical Exam  Nursing note and vitals reviewed. Constitutional: She is oriented to person, place, and time. She appears well-developed and well-nourished. No distress.  Sitting up in bed. NAD  HENT:  Head: Normocephalic and atraumatic.  Eyes: Conjunctivae are normal. Pupils are equal, round, and reactive to light. Right eye exhibits no discharge. Left eye exhibits no discharge.  Cardiovascular: Normal rate, regular rhythm, normal heart sounds and intact distal pulses.   Pulmonary/Chest: Effort normal and breath sounds normal. No respiratory distress. She has no wheezes. She  has no rales.  Abdominal: Soft. She exhibits no distension. There is no tenderness. There is no guarding.  Musculoskeletal: She exhibits no tenderness.  Neurological: She is alert and oriented to person, place, and time.  Skin: Skin is warm and dry.  Psychiatric: Her affect is angry. She is withdrawn. She expresses no  homicidal and no suicidal ideation.    ED Course  Procedures (including critical care time) Labs Review Labs Reviewed  CBC WITH DIFFERENTIAL - Abnormal; Notable for the following:    WBC 11.2 (*)    Neutro Abs 7.9 (*)    All other components within normal limits  COMPREHENSIVE METABOLIC PANEL - Abnormal; Notable for the following:    AST 57 (*)    ALT 81 (*)    All other components within normal limits  SALICYLATE LEVEL - Abnormal; Notable for the following:    Salicylate Lvl <2.0 (*)    All other components within normal limits  VALPROIC ACID LEVEL - Abnormal; Notable for the following:    Valproic Acid Lvl <10.0 (*)    All other components within normal limits  ACETAMINOPHEN LEVEL  URINE RAPID DRUG SCREEN (HOSP PERFORMED)  ETHANOL  CARBAMAZEPINE LEVEL, TOTAL    Imaging Review No results found.   EKG Interpretation None      MDM   Final diagnoses:  Bipolar disorder with depression    23 yo F pw psychiatric complaints. Denies SI/HI. Unusual behavior. H/o bipolar with reported psychotic features. Further h/o obtained from mother as above.  Labs largely reassuring.  Patient requiring psychiatric evaluation. Will hold in ED overnight until psychiatry available in the AM.  TTS consult pending.  Labs and imaging reviewed by myself and considered in medical decision making if ordered. Imaging interpreted by radiology.   Discussed case with Dr. Ethelda Chick who is in agreement with assessment and plan.     Stevie Kern, MD 05/27/14 [phone]

## 2014-05-26 NOTE — ED Notes (Signed)
Pt presents to department for evaluation of depression. Family states patient has history of bipolar disorder and psychotic episodes. States patient has been crying all day, standing in the same spot all day, not getting out of bed, pt states she feels very lost. Denies SI/HI at the time. Pt is alert, calm and cooperative at present.

## 2014-05-26 NOTE — ED Notes (Addendum)
Parents went home, available for contact at [phone] My. Natalie [name]

## 2014-05-27 ENCOUNTER — Encounter (HOSPITAL_COMMUNITY): Payer: Self-pay | Admitting: Emergency Medicine

## 2014-05-27 MED ORDER — HYDROXYZINE HCL 25 MG PO TABS
25.0000 mg | ORAL_TABLET | Freq: Every day | ORAL | Status: DC
  Administered 2014-05-27: 25 mg via ORAL
  Filled 2014-05-27 (×3): qty 1

## 2014-05-27 MED ORDER — TRAZODONE HCL 50 MG PO TABS
200.0000 mg | ORAL_TABLET | Freq: Every day | ORAL | Status: DC
  Administered 2014-05-27: 200 mg via ORAL
  Filled 2014-05-27 (×4): qty 4

## 2014-05-27 MED ORDER — CARBAMAZEPINE 200 MG PO TABS
200.0000 mg | ORAL_TABLET | Freq: Two times a day (BID) | ORAL | Status: DC
  Administered 2014-05-27 – 2014-05-29 (×2): 200 mg via ORAL
  Filled 2014-05-27 (×9): qty 1

## 2014-05-27 MED ORDER — DIVALPROEX SODIUM 125 MG PO DR TAB
125.0000 mg | DELAYED_RELEASE_TABLET | ORAL | Status: AC
  Administered 2014-05-27: 125 mg via ORAL
  Filled 2014-05-27: qty 1

## 2014-05-27 MED ORDER — LAMOTRIGINE 150 MG PO TABS
150.0000 mg | ORAL_TABLET | Freq: Every morning | ORAL | Status: DC
  Administered 2014-05-27 – 2014-05-29 (×3): 150 mg via ORAL
  Filled 2014-05-27 (×3): qty 1

## 2014-05-27 NOTE — ED Provider Notes (Signed)
I have personally seen and examined the patient.  I have discussed the plan of care with the resident.  I have reviewed the documentation on PMH/FH/Soc. History.  I have reviewed the documentation of the resident and agree.  Sam Jacubowitz, MD 05/27/14 0153 

## 2014-05-27 NOTE — ED Provider Notes (Addendum)
Patient threatened to leave AGAINST MEDICAL ADVICE. I went to evaluate the patient and she is clearly psychotic and not able to make a rational decision. She is staring off to space and refusing to answer questions. She then told me that she realized that she wasn't supposed to be here and then went back to staring into space and not answering questions. Because she is not capable of understanding need for treatment and is not able to make a rational decision to refuse treatment, involuntary commitment papers were filled out.  Dione Booze, MD 05/27/14 0406  Involuntary commitment request was refused by necessary. I discussed with the magistrate, who states that although the patient clearly is not able to understand consequences of refusal of treatment, she does not meet criteria for involuntary commitment because of no immediate threat to self or others.  Dione Booze, MD 05/27/14 [phone]

## 2014-05-27 NOTE — ED Notes (Signed)
PHARM TECH ADVISED PHARMACY WILL VERIFY [name] DEPAKOTE TOMORROW D/T [facility] HAS NO RECORD OF THIS MED BEING PRESCRIBED/FILLED.

## 2014-05-27 NOTE — ED Notes (Signed)
MAYARA PAULSON, spouse - [phone] - called pt. Advised will give pt the message.

## 2014-05-27 NOTE — ED Notes (Signed)
Pt refusing to talk to nurse at this time

## 2014-05-27 NOTE — BH Assessment (Signed)
Assessment Note  Natalie [name] is an 23 y.o. female who came to Natalie [name] brought by family who were concerned about her recent behavior and thinks she needs a medication change.  Despite refusing to talk in the ED, and refusing a previous assessment, pt was calm and cooperative with this Clinical research associate.  Pt stated that she was having severe mood swings and strange behaviors in the house and that she has felt "agitated".  Pt denies current SI, HI, A/V hallucinations or history of violence, but admits to conflict in the home.  Pt is disheveled with thought blocking and slow speech and flat affect.  [name] mother reports that pt moved home with her parents a month ago from living with her husband and his parents because her symptoms had worsened and they "did not know how to handle it".  Pt is followed by Natalie [name] and had a recent appt two days ago, but pt was unable to tell me what the MD said, and pt's mom was not at the appt.  Per mom, pt has been exhibiting symptoms of catatonia at home--standing in the bathroom doing nothing for hours and zipping and unzipping her purse zipper.  Mom believes that pt is hearing voices and she says that she talks to people who are not there. Mom also states that pt is paranoid that people are talking about her.  Pt said that she is willing to get help at this time and knows she needs a med adjustment despite wanting to leave ED earlier and IVC paperwork being in process.  Spoke with Natalie Cotton, NP who said that she recommends IP treatment for pt.  Re: IVC--Per Dr. Preston [name] in ED, "I went to evaluate the patient and she is clearly psychotic and not able to make a rational decision. She is staring off to space and refusing to answer questions. She then told me that she realized that she wasn't supposed to be here and then went back to staring into space and not answering questions. Because she is not capable of understanding need for treatment and is not able to make a rational decision  to refuse treatment, involuntary commitment papers were filled out. Natalie Booze, MD  However, Per Dr. Preston [name], IVC was not approved by the magistrate due to pt not being in immediate danger to herself or others.  Pt agrees to voluntary treatment to this Clinical research associate. Natalie [name] will seek placement.   Axis I: Bipolar with psychotic features Axis II: Deferred Axis III:  Past Medical History  Diagnosis Date  . Psychiatric diagnosis   . Chronic bipolar disorder   . OCD (obsessive compulsive disorder)   . H/O suicide attempt 05-2013    OD   Axis IV: problems with primary support group Axis V: 21-30 behavior considerably influenced by delusions or hallucinations OR serious impairment in judgment, communication OR inability to function in almost all areas  Past Medical History:  Past Medical History  Diagnosis Date  . Psychiatric diagnosis   . Chronic bipolar disorder   . OCD (obsessive compulsive disorder)   . H/O suicide attempt 05-2013    OD    History reviewed. No pertinent past surgical history.  Family History: No family history on file.  Social History:  reports that she has never smoked. She does not have any smokeless tobacco history on file. She reports that she does not drink alcohol or use illicit drugs.  Additional Social History:  Alcohol / Drug Use Pain Medications: denies Prescriptions: denies Over the Counter:  denies History of alcohol / drug use?: No history of alcohol / drug abuse  CIWA: CIWA-Ar BP: 107/65 mmHg Pulse Rate: 74 COWS:    Allergies: No Known Allergies  Home Medications:  (Not in a hospital admission)  OB/GYN Status:  No LMP recorded.  General Assessment Data Location of Assessment: Natalie [name] ED Is this a Tele or Face-to-Face Assessment?: Tele Assessment Is this an Initial Assessment or a Re-assessment for this encounter?: Initial Assessment Living Arrangements: Parent;Other relatives;Spouse/significant other Can pt return to current living arrangement?:  Yes Admission Status: Involuntary Is patient capable of signing voluntary admission?: Yes Transfer from: Home Referral Source: Self/Family/Friend     Natalie [name] Living Arrangements: Parent;Other relatives;Spouse/significant other Name of Psychiatrist: Alemu [name] Name of Therapist:  [name])  Education Status Is patient currently in school?: No  Risk to self Suicidal Ideation: No Suicidal Intent: No Is patient at risk for suicide?: Yes Suicidal [name]?: No Access to Means: No What has been your use of drugs/alcohol within the last 12 months?:  (denies) Previous Attempts/Gestures: Yes How many times?: 2 Other Self Harm Risks:  (none known) Triggers for Past Attempts: Hallucinations Intentional Self Injurious Behavior: None Recent stressful life event(s): Conflict (Comment) (husband had a stroke, family conflict) Persecutory voices/beliefs?: No Depression: Yes Depression Symptoms: Despondent;Insomnia;Tearfulness;Loss of interest in usual pleasures;Fatigue;Isolating;Feeling angry/irritable;Feeling worthless/self pity Substance abuse history and/or treatment for substance abuse?: No  Risk to Others Homicidal Ideation: No Thoughts of Harm to Others: No Current Homicidal Intent: No Current Homicidal [name]: No Access to Homicidal Means: No History of harm to others?: No Assessment of Violence: None Noted Does patient have access to weapons?: No Criminal Charges Pending?: No Does patient have a court date: No  Psychosis Hallucinations: None noted Delusions: Persecutory  Mental Status Report Appear/Hygiene: Disheveled;Poor hygiene;Bizarre Eye Contact: Good Motor Activity: Psychomotor retardation Speech: Logical/coherent Level of Consciousness: Alert Mood: Depressed;Sad Affect: Blunted;Constricted Anxiety Level: Minimal Thought Processes: Thought Blocking Judgement: Impaired Orientation: Person;Place;Situation Obsessive Compulsive Thoughts/Behaviors:  Severe  Cognitive Functioning Concentration: Decreased Memory: Recent Impaired;Remote Intact IQ: Average Insight: Poor Impulse Control: Fair Appetite: Poor Weight Loss: 100 Weight Gain: 0 Sleep: Decreased Total Hours of Sleep: 2 Vegetative Symptoms: Decreased grooming  ADLScreening [facility] Assessment Services) Patient's cognitive ability adequate to safely complete daily activities?: Yes Patient able to express need for assistance with ADLs?: Yes Independently performs ADLs?: Yes (appropriate for developmental age)  Prior Inpatient Therapy Prior Inpatient Therapy: Yes Prior Therapy Dates:  (February, 2104) Prior Therapy Facilty/Provider(s):  [facility]) Reason for Treatment:  (suicide attempt)  Prior Outpatient Therapy Prior Outpatient Therapy: Yes Prior Therapy Dates: years Prior Therapy Facilty/Provider(s):  Natalie [name]) Reason for Treatment:  (Bipolar with psychotic features)  ADL Screening (condition at time of admission) Patient's cognitive ability adequate to safely complete daily activities?: Yes Is the patient deaf or have difficulty hearing?: No Does the patient have difficulty seeing, even when wearing glasses/contacts?: No Does the patient have difficulty concentrating, remembering, or making decisions?: Yes Patient able to express need for assistance with ADLs?: Yes Does the patient have difficulty dressing or bathing?: No Independently performs ADLs?: Yes (appropriate for developmental age) Does the patient have difficulty walking or climbing stairs?: No  Home Assistive Devices/Equipment Home Assistive Devices/Equipment: None    Abuse/Neglect Assessment (Assessment to be complete while patient is alone) Physical Abuse: Denies Verbal Abuse: Yes, past (Comment) Sexual Abuse: Denies Exploitation of patient/patient's resources: Denies Self-Neglect: Denies Values / Beliefs Cultural Requests During Hospitalization: None Spiritual Requests During Hospitalization:  None  Advance Directives (For Healthcare) Advance Directive: Patient does not have advance directive Pre-existing out of facility DNR order (yellow form or pink MOST form): No    Additional Information 1:1 In Past 12 Months?: No CIRT Risk: No Elopement Risk: No Does patient have medical clearance?: Yes     Disposition:  Disposition Initial Assessment Completed for this Encounter: Yes Disposition of Patient: Other dispositions (to be determined by psychiatry)  On Site Evaluation by:   Reviewed with Physician:    Theo Dills 05/27/2014 8:49 AM

## 2014-05-27 NOTE — ED Notes (Signed)
Pt refusing to speak to this RN at this time, refusing to take medication

## 2014-05-27 NOTE — BH Assessment (Signed)
Referrals faxed to [facility], [facility], Earlene Plater.  Dossie Arbour, MA  Disposition [facility]

## 2014-05-27 NOTE — BH Assessment (Signed)
Received call for assessment. Spoke with Dr. Doug Sou who said Pt has a history of bipolar disorder and OCD. Family reports Pt has been crying, staring at things, going into rages and doing repetitive behaviors. Pt would not respond during Dr. Alden Server evaluation. Tele-assessment will be initiated.  Harlin Rain Ria Comment, [facility] Triage Specialist [phone]

## 2014-05-27 NOTE — BH Assessment (Signed)
Attempted tele-assessment. Pt will not speak or otherwise respond. Contacted Christianne Borrow, RN who said Pt spoke upon arrival to ED but now will not talk. Irving Burton will contact TTS at [phone] when Pt is willing to participate in assessment.  Harlin Rain Ria Comment, [facility] Triage Specialist [phone]

## 2014-05-27 NOTE — ED Provider Notes (Signed)
TTs consult to evaluate patient for psychiatric admission Results for orders placed during the hospital encounter of 05/26/14  CBC WITH DIFFERENTIAL      Result Value Ref Range   WBC 11.2 (*) 4.0 - 10.5 K/uL   RBC 4.11  3.87 - 5.11 MIL/uL   Hemoglobin 12.6  12.0 - 15.0 g/dL   HCT 00.7  12.1 - 97.5 %   MCV 93.7  78.0 - 100.0 fL   MCH 30.7  26.0 - 34.0 pg   MCHC 32.7  30.0 - 36.0 g/dL   RDW 88.3  25.4 - 98.2 %   Platelets 220  150 - 400 K/uL   Neutrophils Relative % 72  43 - 77 %   Neutro Abs 7.9 (*) 1.7 - 7.7 K/uL   Lymphocytes Relative 23  12 - 46 %   Lymphs Abs 2.6  0.7 - 4.0 K/uL   Monocytes Relative 5  3 - 12 %   Monocytes Absolute 0.6  0.1 - 1.0 K/uL   Eosinophils Relative 0  0 - 5 %   Eosinophils Absolute 0.0  0.0 - 0.7 K/uL   Basophils Relative 0  0 - 1 %   Basophils Absolute 0.1  0.0 - 0.1 K/uL  COMPREHENSIVE METABOLIC PANEL      Result Value Ref Range   Sodium 140  137 - 147 mEq/L   Potassium 4.1  3.7 - 5.3 mEq/L   Chloride 98  96 - 112 mEq/L   CO2 25  19 - 32 mEq/L   Glucose, Bld 90  70 - 99 mg/dL   BUN 9  6 - 23 mg/dL   Creatinine, Ser 6.41  0.50 - 1.10 mg/dL   Calcium 9.8  8.4 - 58.3 mg/dL   Total Protein 8.2  6.0 - 8.3 g/dL   Albumin 4.6  3.5 - 5.2 g/dL   AST 57 (*) 0 - 37 U/L   ALT 81 (*) 0 - 35 U/L   Alkaline Phosphatase 116  39 - 117 U/L   Total Bilirubin 0.3  0.3 - 1.2 mg/dL   GFR calc non Af Amer >90  >90 mL/min   GFR calc Af Amer >90  >90 mL/min  SALICYLATE LEVEL      Result Value Ref Range   Salicylate Lvl <2.0 (*) 2.8 - 20.0 mg/dL  ACETAMINOPHEN LEVEL      Result Value Ref Range   Acetaminophen (Tylenol), Serum <15.0  10 - 30 ug/mL  URINE RAPID DRUG SCREEN (HOSP PERFORMED)      Result Value Ref Range   Opiates NONE DETECTED  NONE DETECTED   Cocaine NONE DETECTED  NONE DETECTED   Benzodiazepines NONE DETECTED  NONE DETECTED   Amphetamines NONE DETECTED  NONE DETECTED   Tetrahydrocannabinol NONE DETECTED  NONE DETECTED   Barbiturates NONE  DETECTED  NONE DETECTED  ETHANOL      Result Value Ref Range   Alcohol, Ethyl (B) <11  0 - 11 mg/dL  CARBAMAZEPINE LEVEL, TOTAL      Result Value Ref Range   Carbamazepine Lvl 4.7  4.0 - 12.0 ug/mL  VALPROIC ACID LEVEL      Result Value Ref Range   Valproic Acid Lvl <10.0 (*) 50.0 - 100.0 ug/mL   No results found.   Doug Sou, MD 05/27/14 [phone]

## 2014-05-27 NOTE — ED Notes (Signed)
Pt continuously coming out of room, when explained to pt why she can't be in the hallway she goes back into the room and then comes back out.

## 2014-05-28 MED ORDER — STERILE WATER FOR INJECTION IJ SOLN
INTRAMUSCULAR | Status: AC
  Administered 2014-05-28: 2.1 mL
  Filled 2014-05-28: qty 10

## 2014-05-28 MED ORDER — OLANZAPINE 10 MG IM SOLR
5.0000 mg | Freq: Once | INTRAMUSCULAR | Status: AC
  Administered 2014-05-28: 5 mg via INTRAMUSCULAR
  Filled 2014-05-28: qty 10

## 2014-05-28 NOTE — ED Notes (Addendum)
Pt sitting on edge of bed, flat affect when speaking, Staring, speaking softly,

## 2014-05-28 NOTE — ED Notes (Signed)
Pt refusing to stay in her room. Security spoke with pt and explained that she could not keep walking around the halls. Pt still would not listen, Dr. Lavella Lemons was notified. Pt refused to speak with Dr. Lavella Lemons. Dr Lavella Lemons ordered IM Zyprexa for pt.

## 2014-05-28 NOTE — ED Notes (Signed)
Patient attempting to take another shower.  Sitter explained to the patient that 2 showers were already completed yesterday, and it's inappropriate to shower at this time.

## 2014-05-28 NOTE — ED Notes (Signed)
Pt standing outside of room, shifting from foot to foot, walking to bathroom looking in the door, smiling, pacing in hallway, . Spoke with TIna at [facility] regarding bed status. States will have a bed, but later tonight.

## 2014-05-28 NOTE — BH Assessment (Signed)
Per Kelly Southard, AC at [facility], adult unit is currently at capacity. Contacted the following facilities for placement:   AT CAPACITY:  [facility], per Patsy  [facility], per Jennifer  [facility], Per Teresa  [facility], Per Amanda  [facility], per Jessica  [facility], Per Robyn  [facility], per Pat  [facility], Per Robyn  [facility], per David  [facility], per Wanda  [facility], per Jennifer  [facility], per Christine  [facility], per Regina  [facility], per Amanda [facility], per Karen  [facility], per Chelsea   Ford Ellis Warrick Jr, LPC, [facility] Triage Specialist [phone]   

## 2014-05-28 NOTE — BH Assessment (Signed)
Per Amy at [facility]-currently at capacity with beds.  Glorious Peach, MS, LCASA Assessment Counselor

## 2014-05-28 NOTE — BH Assessment (Signed)
Spoke with Natalie [name] at [facility] and she reports that they dont accept patient's health coverage and patient would be required to pay a $3000 out of pocket fee. Pt will be referred to other facilities.   Glorious Peach, MS, LCASA Assessment Counselor

## 2014-05-28 NOTE — ED Notes (Signed)
Pt requesting meds to be given at same times as when she takes them at home. As follows: Lamictal @ 8am Depakote @ 11 Trazadone, Vistaril, depakote @ 8p Will inform pharmacy

## 2014-05-28 NOTE — BH Assessment (Signed)
[facility] Assessment Progress Note   Clinician called charge nurse at [facility] to see if patient may be able to be transferred to [facility] to more appropriately address her clinical needs.  Charge nurse Victorino Dike said that right now there were too many people who could need the SAPPU bed that this patient may take.  May look at it later.  Julieanne Cotton at [facility] said that patient will need a private 400 hall room and there is not one available at this time.  She feels confident there will be one available tomorrow.  In the meantime TTS will continue to look for placement.

## 2014-05-28 NOTE — ED Provider Notes (Signed)
Patient alert, content, normal vitals.  Discussed w psych team - awaiting inpatient psych placement.     Suzi Roots, MD 05/28/14 [phone]

## 2014-05-28 NOTE — ED Notes (Signed)
Patient back in the hallway and standing with no purpose.  Security called.  Patient moved back to room with assistance.

## 2014-05-28 NOTE — ED Notes (Signed)
Pt took shower, clean linen placed on bed

## 2014-05-28 NOTE — ED Notes (Signed)
Pt now with very flat affect. Not answering questions asked by this Clinical research associate.

## 2014-05-28 NOTE — ED Provider Notes (Addendum)
I was asked to evaluate the patient who has been placed in room C 28. She continues to ambulate out of her room and around the hallway and does not follow requests to return to her room until security is at her side. This has been a recurrent problem. The patient will make eye contact with me but will not answer any of my questions. Per chart review, this is consistent with previous exams. Per resident note, the patient has displayed psychotic behavior at home - according to her mother. She has a history of mental illness - including depression. She has been refusing all po meds. We will treat with Zyprexa 5mg  IM x 1.   , MD 05/28/14 05/30/14  [id number], MD 05/28/14 [phone]

## 2014-05-28 NOTE — ED Notes (Signed)
Talking to husband on phone, would not pick phone up,this nurse had to put phone in pt's hand and move hand up to ear.

## 2014-05-28 NOTE — ED Notes (Signed)
[employer] at the bedside 

## 2014-05-29 ENCOUNTER — Encounter (HOSPITAL_COMMUNITY): Payer: Self-pay

## 2014-05-29 ENCOUNTER — Inpatient Hospital Stay (HOSPITAL_COMMUNITY)
Admission: AD | Admit: 2014-05-29 | Discharge: 2014-06-05 | DRG: 885 | Disposition: A | Discharge status: Home / Self Care | Payer: Medicaid Other | Source: Intra-hospital | Attending: Psychiatry | Admitting: Psychiatry

## 2014-05-29 DIAGNOSIS — Z5900 Homelessness unspecified: Secondary | ICD-10-CM

## 2014-05-29 DIAGNOSIS — Z5987 Material hardship due to limited financial resources, not elsewhere classified: Secondary | ICD-10-CM

## 2014-05-29 DIAGNOSIS — G4700 Insomnia, unspecified: Secondary | ICD-10-CM | POA: Diagnosis present

## 2014-05-29 DIAGNOSIS — F411 Generalized anxiety disorder: Secondary | ICD-10-CM | POA: Diagnosis present

## 2014-05-29 DIAGNOSIS — F429 Obsessive-compulsive disorder, unspecified: Secondary | ICD-10-CM | POA: Diagnosis present

## 2014-05-29 DIAGNOSIS — F3160 Bipolar disorder, current episode mixed, unspecified: Secondary | ICD-10-CM | POA: Diagnosis present

## 2014-05-29 DIAGNOSIS — F3130 Bipolar disorder, current episode depressed, mild or moderate severity, unspecified: Secondary | ICD-10-CM | POA: Diagnosis not present

## 2014-05-29 DIAGNOSIS — F319 Bipolar disorder, unspecified: Secondary | ICD-10-CM

## 2014-05-29 DIAGNOSIS — Z598 Other problems related to housing and economic circumstances: Secondary | ICD-10-CM | POA: Diagnosis not present

## 2014-05-29 DIAGNOSIS — F3164 Bipolar disorder, current episode mixed, severe, with psychotic features: Secondary | ICD-10-CM | POA: Diagnosis present

## 2014-05-29 DIAGNOSIS — Z5989 Other problems related to housing and economic circumstances: Secondary | ICD-10-CM | POA: Diagnosis not present

## 2014-05-29 DIAGNOSIS — F3112 Bipolar disorder, current episode manic without psychotic features, moderate: Secondary | ICD-10-CM | POA: Diagnosis present

## 2014-05-29 HISTORY — DX: Anxiety disorder, unspecified: F41.9

## 2014-05-29 MED ORDER — LAMOTRIGINE 150 MG PO TABS
150.0000 mg | ORAL_TABLET | Freq: Every day | ORAL | Status: DC
  Administered 2014-05-30 – 2014-06-02 (×4): 150 mg via ORAL
  Filled 2014-05-29 (×6): qty 1

## 2014-05-29 MED ORDER — ACETAMINOPHEN 325 MG PO TABS: 650.0000 mg | ORAL_TABLET | Freq: Four times a day (QID) | ORAL | Status: DC | PRN

## 2014-05-29 MED ORDER — HYDROXYZINE HCL 25 MG PO TABS
25.0000 mg | ORAL_TABLET | Freq: Three times a day (TID) | ORAL | Status: DC | PRN
  Administered 2014-05-30: 25 mg via ORAL
  Filled 2014-05-29 (×2): qty 1

## 2014-05-29 MED ORDER — MAGNESIUM HYDROXIDE 400 MG/5ML PO SUSP
30.0000 mL | Freq: Every day | ORAL | Status: DC | PRN
  Administered 2014-05-31 – 2014-06-03 (×2): 30 mL via ORAL

## 2014-05-29 MED ORDER — TRAZODONE HCL 100 MG PO TABS
200.0000 mg | ORAL_TABLET | Freq: Every day | ORAL | Status: DC
  Administered 2014-05-29 – 2014-06-04 (×7): 200 mg via ORAL
  Filled 2014-05-29: qty 28
  Filled 2014-05-29 (×9): qty 2

## 2014-05-29 MED ORDER — ALUM & MAG HYDROXIDE-SIMETH 200-200-20 MG/5ML PO SUSP
30.0000 mL | ORAL | Status: DC | PRN
  Administered 2014-06-04: 30 mL via ORAL

## 2014-05-29 MED ORDER — CARBAMAZEPINE 200 MG PO TABS
200.0000 mg | ORAL_TABLET | Freq: Two times a day (BID) | ORAL | Status: DC
  Administered 2014-05-29 – 2014-05-30 (×2): 200 mg via ORAL
  Filled 2014-05-29 (×5): qty 1

## 2014-05-29 NOTE — ED Notes (Signed)
Pt complied with instructions for showering when given limitations.

## 2014-05-29 NOTE — ED Provider Notes (Addendum)
Sitting in bed watching tv, alert, no distress. Pt states she is doing all right  Doug Sou, MD 05/29/14 0841  5 pm pt alert, gcs 15 ambulates without difficulty. Stable for transfer to [facility] Results for orders placed during the hospital encounter of 05/26/14  CBC WITH DIFFERENTIAL      Result Value Ref Range   WBC 11.2 (*) 4.0 - 10.5 K/uL   RBC 4.11  3.87 - 5.11 MIL/uL   Hemoglobin 12.6  12.0 - 15.0 g/dL   HCT 78.2  95.6 - 21.3 %   MCV 93.7  78.0 - 100.0 fL   MCH 30.7  26.0 - 34.0 pg   MCHC 32.7  30.0 - 36.0 g/dL   RDW 08.6  57.8 - 46.9 %   Platelets 220  150 - 400 K/uL   Neutrophils Relative % 72  43 - 77 %   Neutro Abs 7.9 (*) 1.7 - 7.7 K/uL   Lymphocytes Relative 23  12 - 46 %   Lymphs Abs 2.6  0.7 - 4.0 K/uL   Monocytes Relative 5  3 - 12 %   Monocytes Absolute 0.6  0.1 - 1.0 K/uL   Eosinophils Relative 0  0 - 5 %   Eosinophils Absolute 0.0  0.0 - 0.7 K/uL   Basophils Relative 0  0 - 1 %   Basophils Absolute 0.1  0.0 - 0.1 K/uL  COMPREHENSIVE METABOLIC PANEL      Result Value Ref Range   Sodium 140  137 - 147 mEq/L   Potassium 4.1  3.7 - 5.3 mEq/L   Chloride 98  96 - 112 mEq/L   CO2 25  19 - 32 mEq/L   Glucose, Bld 90  70 - 99 mg/dL   BUN 9  6 - 23 mg/dL   Creatinine, Ser 6.29  0.50 - 1.10 mg/dL   Calcium 9.8  8.4 - 52.8 mg/dL   Total Protein 8.2  6.0 - 8.3 g/dL   Albumin 4.6  3.5 - 5.2 g/dL   AST 57 (*) 0 - 37 U/L   ALT 81 (*) 0 - 35 U/L   Alkaline Phosphatase 116  39 - 117 U/L   Total Bilirubin 0.3  0.3 - 1.2 mg/dL   GFR calc non Af Amer >90  >90 mL/min   GFR calc Af Amer >90  >90 mL/min  SALICYLATE LEVEL      Result Value Ref Range   Salicylate Lvl <2.0 (*) 2.8 - 20.0 mg/dL  ACETAMINOPHEN LEVEL      Result Value Ref Range   Acetaminophen (Tylenol), Serum <15.0  10 - 30 ug/mL  URINE RAPID DRUG SCREEN (HOSP PERFORMED)      Result Value Ref Range   Opiates NONE DETECTED  NONE DETECTED   Cocaine NONE DETECTED  NONE DETECTED   Benzodiazepines NONE  DETECTED  NONE DETECTED   Amphetamines NONE DETECTED  NONE DETECTED   Tetrahydrocannabinol NONE DETECTED  NONE DETECTED   Barbiturates NONE DETECTED  NONE DETECTED  ETHANOL      Result Value Ref Range   Alcohol, Ethyl (B) <11  0 - 11 mg/dL  CARBAMAZEPINE LEVEL, TOTAL      Result Value Ref Range   Carbamazepine Lvl 4.7  4.0 - 12.0 ug/mL  VALPROIC ACID LEVEL      Result Value Ref Range   Valproic Acid Lvl <10.0 (*) 50.0 - 100.0 ug/mL   No results found.   Doug Sou, MD 05/29/14 [phone]

## 2014-05-29 NOTE — ED Notes (Signed)
Charge RN was called to bedside. Primary RN was concerned because patient had been given her PM meds but had not taken them yet. When I arrived pt had the medicines in a paper cup in her hands looking at them. I spent approx. 30 minutes with the patient trying to develop a rapport, after the 30 minutes, patient still refused to take medications. I took the medications and politely told our policy states patient are not allowed to have medications just sitting in their room. I informed her if she decided she wants to take them later she can.

## 2014-05-29 NOTE — Tx Team (Signed)
Initial Interdisciplinary Treatment Plan  PATIENT STRENGTHS: (choose at least two) Communication skills Physical Health Supportive family/friends  PATIENT STRESSORS: Marital or family conflict Medication change or noncompliance   PROBLEM LIST: Problem List/Patient Goals Date to be addressed Date deferred Reason deferred Estimated date of resolution  Relationship problems 05/29/14     Hearing voices 05/29/14     depression 05/29/14     Crying spells 05/29/14     Non med compliant 05/29/14                              DISCHARGE CRITERIA:  Ability to meet basic life and health needs Adequate post-discharge living arrangements Improved stabilization in mood, thinking, and/or behavior Motivation to continue treatment in a less acute level of care  PRELIMINARY DISCHARGE PLAN: Attend aftercare/continuing care group Attend PHP/IOP Outpatient therapy Return to previous living arrangement  PATIENT/FAMIILY INVOLVEMENT: This treatment plan has been presented to and reviewed with the patient, Natalie [name],.  The patient and family have been given the opportunity to ask questions and make suggestions.  Heriberto Antigua M 05/29/2014, 7:36 PM

## 2014-05-29 NOTE — ED Notes (Signed)
Pt requested a shower.  Sitter advised pt stood in the bathroom for 15 minutes staring into space before returning to her room.

## 2014-05-29 NOTE — ED Notes (Signed)
Charge RN to speak with pt about taking meds; pt continues to sit on the side of bed looking into med cup; not making any attempts to take meds;

## 2014-05-29 NOTE — BH Assessment (Signed)
[facility] Assessment Progress Note  Pt assigned to Rm 402-1 at [facility] to the service of Thedore Mins, MD.  Per Berneice Heinrich, RN, [facility], [facility] has been notified.  Doylene Canning, MA Triage Specialist 05/29/2014 @ 14:58

## 2014-05-29 NOTE — Progress Notes (Signed)
Patient ID: Natalie [name], female   DOB: [birth year], 23 y.o.   MRN: [id number] Pt is a 23 yr old female stating that she came in to protect her rights of marriage. Pt has a hx of bipolar disorder with schizoaffective features and OCD. Pt has some thought blocking and smiling at inapposite times. Pt denies si/hi/avh on admission. denies pain. Pt stated she has been married for two years, but is now separated and lives with parents. Pt has trouble making decsions on own and has some confusion. Meal given. Pt introduced to unit. Remains safe on unit

## 2014-05-29 NOTE — ED Notes (Signed)
Lavone Nian, RN, advised he spoke w/pt's spouse re: pt's admission to [facility].

## 2014-05-29 NOTE — Progress Notes (Signed)
Writer faxed patient to the following facilities with open beds:   [facility], [facility], [facility], [facility], [facility], [facility] and [facility].

## 2014-05-29 NOTE — [facility] Group Notes (Signed)
Adult Psychoeducational Group Note  Date:  05/29/2014 Time:  9:03 PM  Group Topic/Focus:  Wrap-Up Group:   The focus of this group is to help patients review their daily goal of treatment and discuss progress on daily workbooks.  Participation Level:  Minimal  Participation Quality:  Attentive  Affect:  Smiling  Cognitive:  Appropriate  Insight: Limited  Engagement in Group:  Limited  Modes of Intervention:  Discussion  Additional Comments:  Runette is a new admit.  She introduced herself to the group and stated she wants to work on her independence and get her life together.  Caroll Rancher A 05/29/2014, 9:03 PM

## 2014-05-29 NOTE — ED Notes (Signed)
Pt sitting at bedside with cup of meds in hand. Pt asked questions about meds she was taking; Pt continued to hold meds in hand for about 30 minutes; when asked if she was concerned about meds pt did not say anything; pt sat in silence for about 10 minutes; When RN advised pt that meds would have to be taken or documented as refusal started crying; RN left pt for a few minutes with sitter within arms reach. Charge RN advised of situation.

## 2014-05-29 NOTE — ED Notes (Signed)
Witnessed pt gathering shower items.  Advised pt she had to take a shower this time if she went back into the bathroom.  If she did not she would not be allowed to try again today.  Pt acknowledged understanding.

## 2014-05-30 ENCOUNTER — Encounter (HOSPITAL_COMMUNITY): Payer: Self-pay | Admitting: Psychiatry

## 2014-05-30 DIAGNOSIS — F3164 Bipolar disorder, current episode mixed, severe, with psychotic features: Principal | ICD-10-CM

## 2014-05-30 DIAGNOSIS — F3160 Bipolar disorder, current episode mixed, unspecified: Secondary | ICD-10-CM | POA: Diagnosis present

## 2014-05-30 MED ORDER — AMANTADINE HCL 100 MG PO CAPS
100.0000 mg | ORAL_CAPSULE | Freq: Two times a day (BID) | ORAL | Status: DC
  Administered 2014-05-30 – 2014-06-05 (×11): 100 mg via ORAL
  Filled 2014-05-30: qty 1
  Filled 2014-05-30: qty 28
  Filled 2014-05-30 (×13): qty 1
  Filled 2014-05-30: qty 28
  Filled 2014-05-30 (×4): qty 1

## 2014-05-30 MED ORDER — ARIPIPRAZOLE 5 MG PO TABS
5.0000 mg | ORAL_TABLET | Freq: Two times a day (BID) | ORAL | Status: DC
  Administered 2014-05-30 – 2014-06-01 (×3): 5 mg via ORAL
  Filled 2014-05-30 (×11): qty 1
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
## 2014-05-30 NOTE — Tx Team (Signed)
  Interdisciplinary Treatment Plan Update   Date Reviewed:  05/30/2014  Time Reviewed:  8:28 AM  Progress in Treatment:   Attending groups: Inconsistently Participating in groups: Yes When in attendance Taking medication as prescribed: Yes  Tolerating medication: Yes Family/Significant other contact made: No  Patient understands diagnosis: Yes AEB asking for help with medication stabilization related to depression and agitation Discussing patient identified problems/goals with staff: Yes  See initial care plan Medical problems stabilized or resolved: Yes Denies suicidal/homicidal ideation: Yes In tx team Patient has not harmed self or others: Yes  For review of initial/current patient goals, please see plan of care.  Estimated Length of Stay:  4-5 days  Reason for Continuation of Hospitalization: Anxiety Depression Hallucinations Medication stabilization  New Problems/Goals identified:  N/A  Discharge Plan or Barriers:   return home, follow up outpt  Additional Comments:  Natalie [name] is an 23 y.o. female who came to [facility] brought by family who were concerned about her recent behavior and thinks she needs a medication change. Despite refusing to talk in the ED, and refusing a previous assessment, pt was calm and cooperative with this Clinical research associate. Pt stated that she was having severe mood swings and strange behaviors in the house and that she has felt "agitated". Pt denies current SI, HI, A/V hallucinations or history of violence, but admits to conflict in the home. Pt is disheveled with thought blocking and slow speech and flat affect.  [name] mother reports that pt moved home with her parents a month ago from living with her husband and his parents because her symptoms had worsened and they "did not know how to handle it".   Attendees:  Signature: Thedore Mins, MD 05/30/2014 8:28 AM   Signature: Richelle Ito, LCSW 05/30/2014 8:28 AM  Signature: Fransisca Kaufmann, NP 05/30/2014 8:28 AM   Signature: Joslyn Devon, RN 05/30/2014 8:28 AM  Signature: Liborio Nixon, RN 05/30/2014 8:28 AM  Signature:  05/30/2014 8:28 AM  Signature:   05/30/2014 8:28 AM  Signature:    Signature:    Signature:    Signature:    Signature:    Signature:      Scribe for Treatment Team:   Richelle Ito, LCSW  05/30/2014 8:28 AM

## 2014-05-30 NOTE — BHH Suicide Risk Assessment (Signed)
   Nursing information obtained from:  Patient Demographic factors:  Low socioeconomic status;Unemployed Current Mental Status:  NA Loss Factors:  Loss of significant relationship Historical Factors:  Family history of mental illness or substance abuse Risk Reduction Factors:  Living with another person, especially a relative;Positive social support Total Time spent with patient: 20 minutes  CLINICAL FACTORS:   Severe Anxiety and/or Agitation Bipolar Disorder:   Mixed State Depression:   Aggression Anhedonia Delusional Hopelessness Impulsivity Insomnia Obsessive-Compulsive Disorder More than one psychiatric diagnosis Currently Psychotic Unstable or Poor Therapeutic Relationship  Psychiatric Specialty Exam: Physical Exam  Psychiatric: Her mood appears anxious. Her speech is rapid and/or pressured. She is hyperactive and actively hallucinating. Thought content is paranoid. Cognition and memory are normal. She expresses impulsivity. She exhibits a depressed mood.    Review of Systems  Constitutional: Negative.   HENT: Negative.   Eyes: Negative.   Respiratory: Negative.   Cardiovascular: Negative.   Gastrointestinal: Negative.   Genitourinary: Negative.   Musculoskeletal: Negative.   Skin: Negative.   Neurological: Negative.   Endo/Heme/Allergies: Negative.   Psychiatric/Behavioral: Negative.     Blood pressure 128/75, pulse 128, temperature 98.7 F (37.1 C), temperature source Oral, resp. rate 18, height 5\' 4"  (1.626 m), weight 88.451 kg (195 lb), last menstrual period 05/26/2014.Body mass index is 33.46 kg/(m^2).  General Appearance: Disheveled  Eye 05/28/2014::  Fair  Speech:  Pressured  Volume:  Normal  Mood:  Anxious and Depressed  Affect:  Depressed  Thought Process:  Circumstantial  Orientation:  Full (Time, Place, and Person)  Thought Content:  Paranoid Ideation  Suicidal Thoughts:  No  Homicidal Thoughts:  No  Memory:  Immediate;   Fair Recent;    Fair Remote;   Fair  Judgement:  Impaired  Insight:  Lacking  Psychomotor Activity:  Increased  Concentration:  Fair  Recall:  [id number] of Knowledge:Fair  Language: Good  Akathisia:  No  Handed:  Right  AIMS (if indicated):     Assets:  Communication Skills Desire for Improvement Physical Health  Sleep:  Number of Hours: 3.25   Musculoskeletal: Strength & Muscle Tone: within normal limits Gait & Station: normal Patient leans: N/A  COGNITIVE FEATURES THAT CONTRIBUTE TO RISK:  Closed-mindedness Polarized thinking    SUICIDE RISK:   Minimal: No identifiable suicidal ideation.  Patients presenting with no risk factors but with morbid ruminations; may be classified as minimal risk based on the severity of the depressive symptoms  PLAN OF CARE:1. Admit for crisis management and stabilization. 2. Medication management to reduce current symptoms to base line and improve the  patient's overall level of functioning 3. Treat health problems as indicated. 4. Develop treatment plan to decrease risk of relapse upon discharge and the need for readmission. 5. Psycho-social education regarding relapse prevention and self care. 6. Health care follow up as needed for medical problems. 7. Restart home medications where appropriate.   I certify that inpatient services furnished can reasonably be expected to improve the patient's condition.  [employer], MD 05/30/2014, 10:38 AM

## 2014-05-30 NOTE — B[facility] Group Notes (Signed)
[facility] [employer] Group Therapy  05/30/2014 , 11:18 AM    Type of Therapy:  [employer] Presentation  Participation Level: Did not attend  Summary of Progress/Problems:  Onalee Hua from [employer] came to present his recovery story and play the guitar.    Daryel Gerald B 05/30/2014 , 11:18 AM

## 2014-05-30 NOTE — Progress Notes (Signed)
D: Pt. Flat/quiet. She is somewhat labile, crying at times. She is slow to respond. A: Pt given urine cup for specimen, she returned with about "two drops". Pt given second cup and encouraged to drink/given fluids. Pt. Returned again with around 10cc.  Pt given a third cup which later she produced the same minimal amount of urine.  (urine in refrigerator to be picked up) Support/encouragement given. Pt. Receptive, remains safe. Denies SI/HI.

## 2014-05-30 NOTE — Progress Notes (Signed)
Adult Psychoeducational Group Note  Date:  05/30/2014 Time:  9:01 PM  Group Topic/Focus:  Wrap-Up Group:   The focus of this group is to help patients review their daily goal of treatment and discuss progress on daily workbooks.  Participation Level:  Minimal  Participation Quality:  Drowsy  Affect:  Flat  Cognitive:  Lacking  Insight: Limited  Engagement in Group:  Limited  Modes of Intervention:  Socialization and Support  Additional Comments:  Patient attended and participated in group tonight. She reports that today she finally went to a group. She tries to keep up with her medication. She went for breakfast but not lunch nor dinner because she did not want to go.  Lita Mains [facility] 05/30/2014, 9:01 PM

## 2014-05-30 NOTE — Progress Notes (Signed)
Patient ID: Natalie [name], female   DOB: Jul 21, 1991, 23 y.o.   MRN: [id number] Pt laying in bed resting with eyes closed. Respirations even and unlabored. No distress noted. 15 min continued for safety.

## 2014-05-30 NOTE — BHH Counselor (Signed)
Adult Psychosocial Assessment Update Interdisciplinary Team  Previous [facility] admissions/discharges:  Admissions Discharges  Date: 12/22/13 Date:  Date: Date:  Date: Date:  Date: Date:  Date: Date:   Changes since the last Psychosocial Assessment (including adherence to outpatient mental health and/or substance abuse treatment, situational issues contributing to decompensation and/or relapse). Malana states she recently left her boyfriend's parents' house and him to live with her parents.  She now feels like she has done irreparable damage to the relationship with her parents by things she has said.  Although she states she has been taking her medication regularly, evidence would indicate otherwise.  She states she is here for help with depression and agitation.             Discharge Plan 1. Will you be returning to the same living situation after discharge?   Yes:X  Home with parents No:      If no, what is your plan?           2. Would you like a referral for services when you are discharged? Yes:     If yes, for what services?  No:   X    States she gets services at [facility].       Summary and Recommendations (to be completed by the evaluator) Dali is a 23 YO AA female with a history of previous hospitalizations, both here and also at [facility].  She has some axis 2 traits that make it difficult to get a clear picture of precipitants and accurate history of illness and progress.  She can benefit from crises stabilization, medication management, including injectable, therapeutic milieu and referral for services                       Signature:  Ida Rogue, 05/30/2014 3:31 PM
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
## 2014-05-30 NOTE — BHH Group Notes (Signed)
[facility] LCSW Aftercare Discharge Planning Group Note   05/30/2014 11:15 AM  Participation Quality:  Engaged  Mood/Affect:  Depressed  Depression Rating:  6  Anxiety Rating:  6  Thoughts of Suicide:  No Will you contract for safety?   NA  Current AVH:  No  Plan for Discharge/Comments:  Natalie [name] states she is here "to get help with my meds because of my depression and agitation fluctuation."  Was staying with parents, "but I said some things I cannot take back."  Became tearful.  Other pts attempted to encourage her.  Transportation Means: unk  Supports: unk   Natalie [name], Stanley B

## 2014-05-30 NOTE — BHH Suicide Risk Assessment (Signed)
B[facility] INPATIENT:  Family/Significant Other Suicide Prevention Education  Suicide Prevention Education:  Education Completed; No one has been identified by the patient as the family member/significant other with whom the patient will be residing, and identified as the person(s) who will aid the patient in the event of a mental health crisis (suicidal ideations/suicide attempt).  With written consent from the patient, the family member/significant other has been provided the following suicide prevention education, prior to the and/or following the discharge of the patient.  The suicide prevention education provided includes the following:  Suicide risk factors  Suicide prevention and interventions  National Suicide Hotline telephone number  [facility] assessment telephone number  [facility] Emergency Assistance 911  [facility] and/or Residential Mobile Crisis Unit telephone number  Request made of family/significant other to:  Remove weapons (e.g., guns, rifles, knives), all items previously/currently identified as safety concern.    Remove drugs/medications (over-the-counter, prescriptions, illicit drugs), all items previously/currently identified as a safety concern.  The family member/significant other verbalizes understanding of the suicide prevention education information provided.  The family member/significant other agrees to remove the items of safety concern listed above.  The patient did not endorse SI at the time of admission, nor did the patient c/o SI during the stay here.  SPE not required.   Daryel Gerald B 05/30/2014, 3:27 PM

## 2014-05-31 LAB — PREGNANCY, URINE: Preg Test, Ur: NEGATIVE

## 2014-05-31 NOTE — Progress Notes (Signed)
D:  Patient up and present in the milieu, but not interacting with peers.  Occasionally asks a question of staff, but when pressed for more information she just stands and stares.  Her roommate reported that patient was going into the bathroom and standing there for very long periods of time.  Discussed with providers who agree that for now she should have a no roommate order.  Her current roommate was moved to an empty bed down the hall.  Patient rates depression at 8 and hopelessness at 3 today.  She denies suicidal ideation.  She took her medications this morning in less than a minute.  She questioned what she was getting and stared at the pills for several seconds before putting them in her mouth.  Early this morning patient had gotten off the unit and refused to move.  She stood in the hallway for a long time and eventually came back to the 400 hallway.  She has been kept back for meals the rest of the day.   A:  Medications given as prescribed.  Explained to patient what she was receiving for medicines.  Offered support and encouragement.   R:  Patient continues to be thought blocking and it is difficult to assess if she is hearing voices.  She is tolerating her current medications.  Safety is maintained.  She is mostly cooperative.

## 2014-05-31 NOTE — Progress Notes (Signed)
[facility] MD Progress Note  05/31/2014 11:56 AM Natalie [name]  MRN:  [id number] Subjective:   Patient states "I am feeling very agitated. I don't understand why I have to be on this hall? I am only depressed because I am here."  Objective:  Patient is seen and chart is reviewed. She is assessed in her room today. Patient appears very guarded and is noted to have thought blocking. She often stares at Clinical research associate after a question is asked. Nursing staff report that the patient is having trouble functioning on the unit. Her roommate reporting that the patient has been standing in the bathroom for prolonged periods of time. Patient has been held back for meals due to refusing to return to the unit this morning. She appears to be responding to internal stimuli but denies. So far she has been compliant with her medications.   Diagnosis:   DSM5: Total Time spent with patient: 20 minutes AXIS I: Bipolar 1 disorder, most recent episode mixed with psychosis  AXIS II: Cluster B Traits  AXIS III:  Past Medical History   Diagnosis  Date   .  Psychiatric diagnosis    .  Chronic bipolar disorder    .  OCD (obsessive compulsive disorder)    .  H/O suicide attempt  05-2013     OD   .  Anxiety     AXIS IV: economic problems, housing problems, occupational problems, other psychosocial or environmental problems, problems with access to health care services and problems with primary support group  AXIS V: 31-40 impairment in reality testing   ADL's:  Intact  Sleep: Fair  Appetite:  Fair  Suicidal Ideation:  Denies Homicidal Ideation:  Denies AEB (as evidenced by):  Psychiatric Specialty Exam: Physical Exam  Review of Systems  Constitutional: Negative.   HENT: Negative.   Eyes: Negative.   Respiratory: Negative.   Cardiovascular: Negative.   Gastrointestinal: Negative.   Genitourinary: Negative.   Musculoskeletal: Negative.   Skin: Negative.   Neurological: Negative.   Endo/Heme/Allergies:  Negative.   Psychiatric/Behavioral: Positive for depression and hallucinations. The patient is nervous/anxious.     Blood pressure 102/65, pulse 85, temperature 98.7 F (37.1 C), temperature source Oral, resp. rate 18, height 5\' 4"  (1.626 m), weight 88.451 kg (195 lb), last menstrual period 05/26/2014.Body mass index is 33.46 kg/(m^2).  General Appearance: Disheveled  Eye 05/28/2014::  Fair  Speech:  Pressured  Volume:  Increased  Mood:  Angry and Anxious  Affect:  Labile  Thought Process:  Circumstantial  Orientation:  Full (Time, Place, and Person)  Thought Content:  Delusions and Paranoid Ideation  Suicidal Thoughts:  No  Homicidal Thoughts:  No  Memory:  Immediate;   Fair Recent;   Fair Remote;   Fair  Judgement:  Impaired  Insight:  Lacking  Psychomotor Activity:  Increased  Concentration:  Fair  Recall:  [id number] of Knowledge:Fair  Language: Good  Akathisia:  No  Handed:  Right  AIMS (if indicated):     Assets:  Communication Skills Desire for Improvement Leisure Time Physical Health Resilience  Sleep:  Number of Hours: 3.75   Musculoskeletal: Strength & Muscle Tone: within normal limits Gait & Station: normal Patient leans: N/A  Current Medications: Current Facility-Administered Medications  Medication Dose Route Frequency Provider Last Rate Last Dose  . acetaminophen (TYLENOL) tablet 650 mg  650 mg Oral Q6H PRN [employer], PA-C      . alum & mag hydroxide-simeth (MAALOX/MYLANTA) 200-200-20 MG/5ML  suspension 30 mL  30 mL Oral Q4H PRN Kerry Hough, PA-C      . amantadine (SYMMETREL) capsule 100 mg  100 mg Oral BID Jovin Fester   100 mg at 05/31/14 0755  . ARIPiprazole (ABILIFY) tablet 5 mg  5 mg Oral BID PC Nechemia Chiappetta   5 mg at 05/31/14 0754  . hydrOXYzine (ATARAX/VISTARIL) tablet 25 mg  25 mg Oral TID PRN Kerry Hough, PA-C   25 mg at 05/30/14 2106  . lamoTRIgine (LAMICTAL) tablet 150 mg  150 mg Oral Daily Kerry Hough, PA-C   150 mg at  05/31/14 0755  . magnesium hydroxide (MILK OF MAGNESIA) suspension 30 mL  30 mL Oral Daily PRN Kerry Hough, PA-C      . traZODone (DESYREL) tablet 200 mg  200 mg Oral QHS Kerry Hough, PA-C   200 mg at 05/30/14 2059    Lab Results:  Results for orders placed during the hospital encounter of 05/29/14 (from the past 48 hour(s))  PREGNANCY, URINE     Status: None   Collection Time    05/30/14 11:41 AM      Result Value Ref Range   Preg Test, Ur NEGATIVE  NEGATIVE   Comment:            THE SENSITIVITY OF THIS     METHODOLOGY IS >20 mIU/mL.     Performed at [facility]    Physical Findings: AIMS: Facial and Oral Movements Muscles of Facial Expression: None, normal Lips and Perioral Area: None, normal Jaw: None, normal Tongue: None, normal,Extremity Movements Upper (arms, wrists, hands, fingers): None, normal Lower (legs, knees, ankles, toes): None, normal, Trunk Movements Neck, shoulders, hips: None, normal, Overall Severity Severity of abnormal movements (highest score from questions above): None, normal Incapacitation due to abnormal movements: None, normal Patient's awareness of abnormal movements (rate only patient's report): No Awareness, Dental Status Current problems with teeth and/or dentures?: No Does patient usually wear dentures?: No  CIWA:    COWS:     Treatment Plan Summary: Daily contact with patient to assess and evaluate symptoms and progress in treatment Medication management  Plan:  1. Continue crisis management and stabilization.  2. Medication management:  -Continue Amantadine 100 mg BID for EPS prevention -Continue Abilify 5 mg BID for psychosis -Continue Vistaril 25 mg tid prn anxiety -Continue Lamictal 150 mg daily for improved mood stability -Continue Trazodone 200 mg hs for insomnia  3. Encouraged patient to attend groups and participate in group counseling sessions and activities.  4. Discharge plan in progress.  5.  Continue current treatment plan.  6. Address health issues: Vitals reviewed and stable. 7. Do Not Admit due to disorganized behavior   Medical Decision Making Problem Points:  Established problem, worsening (2), Review of last therapy session (1) and Review of psycho-social stressors (1) Data Points:  Order Aims Assessment (2) Review or order clinical lab tests (1) Review of medication regiment & side effects (2) Review of new medications or change in dosage (2)  I certify that inpatient services furnished can reasonably be expected to improve the patient's condition.   Fransisca Kaufmann NP-C 05/31/2014, 11:56 AM  Patient seen, evaluated and I agree with notes by Nurse Practitioner. Thedore Mins, MD

## 2014-05-31 NOTE — [facility] Group Notes (Signed)
[facility] LCSW Group Therapy Note  Date/Time: 05/31/2014 1:15-1:55pm  Type of Therapy/Topic:  Group Therapy:  Balance in Life  Participation Level: None   Description of Group:    This group will address the concept of balance and how it feels and looks when one is unbalanced. Patients will be encouraged to process areas in their lives that are out of balance, and identify reasons for remaining unbalanced. Facilitators will guide patients utilizing problem- solving interventions to address and correct the stressor making their life unbalanced. Understanding and applying boundaries will be explored and addressed for obtaining  and maintaining a balanced life. Patients will be encouraged to explore ways to assertively make their unbalanced needs known to significant others in their lives, using other group members and facilitator for support and feedback.  Therapeutic Goals: 1. Patient will identify two or more emotions or situations they have that consume much of in their lives. 2. Patient will identify signs/triggers that life has become out of balance:  3. Patient will identify two ways to set boundaries in order to achieve balance in their lives:  4. Patient will demonstrate ability to communicate their needs through discussion and/or role plays  Summary of Patient Progress:  Patient attended group but did not participate in the group discussion.  Therapeutic Modalities:   Cognitive Behavioral Therapy Solution-Focused Therapy Assertiveness Training   Tessa Lerner 05/31/2014, 2:14 PM

## 2014-05-31 NOTE — Progress Notes (Signed)
NUTRITION ASSESSMENT  Pt identified as at risk on the Malnutrition Screen Tool  INTERVENTION: Educated patient on the importance of nutrition and encouraged intake of food and beverages.   NUTRITION DIAGNOSIS: Unintentional weight loss likely related to sub-optimal intake as evidenced by weight trend.    Goal: Pt to meet >/= 90% of their estimated nutrition needs.  Monitor:  PO intake  Assessment:  Pt admitted with agitation and depression. Pt was brought to the [facility] by family who were concerned about recent behaviors. The patient moved home to live with parents a month ago. Her mother reports the patient has been acting catatonic and talking to people who are not present.   - Pt reports she was on a regular diet at home and eating normal portions - Attempted multiple times to ask further questions about nutrition history however pt just kept looking at me and laughing - Question accuracy of weight trend as weight is documented as 223 pounds 05/26/14 and 195 pounds 05/29/14 - Per RN notes, pt ate 100% of lunch 05/29/14   23 y.o. female  Height: Ht Readings from Last 1 Encounters:  05/29/14 5\' 4"  (1.626 m)    Weight: Wt Readings from Last 1 Encounters:  05/29/14 195 lb (88.451 kg)    Weight Hx: Wt Readings from Last 10 Encounters:  05/29/14 195 lb (88.451 kg)  05/26/14 223 lb (101.152 kg)  02/13/14 240 lb (108.863 kg)  12/22/13 239 lb (108.41 kg)    BMI:  Body mass index is 33.46 kg/(m^2). Pt meets criteria for class I obesity based on current BMI.  Estimated Nutritional Needs: Kcal: 25-30 kcal/kg Protein: > 1 gram protein/kg Fluid: 1 ml/kcal  Diet Order: General Pt is also offered choice of unit snacks mid-morning and mid-afternoon.  Pt is eating as desired.   Lab results and medications reviewed. AST/ALT elevated.   12/24/13 MS, RD, LDN [phone] Pager [phone] Weekend/After Hours Pager

## 2014-05-31 NOTE — Progress Notes (Signed)
Patient requested to speak to LCSW on two different occasions, however when given the opportunity, patient would refuse to talk, or would make bizarre statements that did not make sense.  Patient would then smile and state "nevermind."  Tessa Lerner, MSW, LCSW 2:48 PM 05/31/2014
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
## 2014-05-31 NOTE — Progress Notes (Signed)
D:Patient is out of room, wandering in hall. Patient is disheveled, irritable, and resistant to care. Patient complained of constipation. PRN medication given.  A: Patient given support and encouragement.  R: Patient refusing medications, and needs prompting in order to comply. Patient stares at medication cup for long periods of time and refuses.

## 2014-05-31 NOTE — [facility] Group Notes (Signed)
[facility] Group Notes:  (Nursing/MHT/Case Management/Adjunct)  Date:  05/31/2014  Time:  9:47 AM  Type of Therapy:  Nurse Education  Participation Level:  None  Participation Quality:  Inattentive  Affect:  Flat  Cognitive:  Oriented  Insight:  None  Engagement in Group:  Poor  Modes of Intervention:  Discussion  Summary of Progress/Problems:  Larina Earthly 05/31/2014, 9:47 AM

## 2014-06-01 MED ORDER — OLANZAPINE 10 MG IM SOLR
10.0000 mg | Freq: Once | INTRAMUSCULAR | Status: AC
  Administered 2014-06-01: 10 mg via INTRAMUSCULAR
  Filled 2014-06-01: qty 10

## 2014-06-01 MED ORDER — OLANZAPINE 10 MG PO TBDP
10.0000 mg | ORAL_TABLET | Freq: Three times a day (TID) | ORAL | Status: DC | PRN
  Filled 2014-06-01: qty 1

## 2014-06-01 MED ORDER — OLANZAPINE 10 MG IM SOLR
INTRAMUSCULAR | Status: AC
  Administered 2014-06-01: 10 mg via INTRAMUSCULAR
  Filled 2014-06-01: qty 10

## 2014-06-01 NOTE — Progress Notes (Signed)
[facility] MD Progress Note  06/01/2014 12:21 PM Natalie [name]  MRN:  [id number]  Subjective:   I do not trust my parents.  I don't belong here.  I wanted discharge.    Objective:  Patient is seen and chart is reviewed.  The patient remains very guarded, paranoid and she continues to have thought blocking.  She does not participate in the groups.  She gets easily irritable and sensitive when someone I ask about her psychiatric illness.  Sometimes she stares and it appears that she is responding to internal stimuli.  Last night she did not require medication but this morning after some encouragement she was able to take the medication.  She is very upset on her parents .  She endorsed that she will not trust ever on her parents and she liked to live on her own.  Patient denies any side effects of medication.  She has significant thought blocking .  She is not disruptive but her thoughts are disorganized.  She sleeping on and off.   Diagnosis:   DSM5: Total Time spent with patient: 20 minutes AXIS I: Bipolar 1 disorder, most recent episode mixed with psychosis  AXIS II: Cluster B Traits  AXIS III:  Past Medical History   Diagnosis  Date   .  Psychiatric diagnosis    .  Chronic bipolar disorder    .  OCD (obsessive compulsive disorder)    .  H/O suicide attempt  05-2013     OD   .  Anxiety     AXIS IV: economic problems, housing problems, occupational problems, other psychosocial or environmental problems, problems with access to health care services and problems with primary support group  AXIS V: 31-40 impairment in reality testing   ADL's:  Intact  Sleep: Fair  Appetite:  Fair  Suicidal Ideation:  Denies Homicidal Ideation:  Denies AEB (as evidenced by):  Psychiatric Specialty Exam: Physical Exam  Review of Systems  Constitutional: Negative.   HENT: Negative.   Eyes: Negative.   Respiratory: Negative.   Cardiovascular: Negative.   Gastrointestinal: Negative.    Genitourinary: Negative.   Musculoskeletal: Negative.   Skin: Negative.   Neurological: Negative.   Endo/Heme/Allergies: Negative.   Psychiatric/Behavioral: Positive for depression and hallucinations. The patient is nervous/anxious.     Blood pressure 111/79, pulse 95, temperature 97.2 F (36.2 C), temperature source Oral, resp. rate 16, height 5\' 4"  (1.626 m), weight 195 lb (88.451 kg), last menstrual period 05/26/2014.Body mass index is 33.46 kg/(m^2).  General Appearance: Disheveled and Guarded  05/28/2014::  Fair  Speech:  Pressured  Volume:  Increased  Mood:  Angry and Anxious  Affect:  Labile  Thought Process:  Circumstantial  Orientation:  Full (Time, Place, and Person)  Thought Content:  Delusions and Paranoid Ideation  Suicidal Thoughts:  No  Homicidal Thoughts:  No  Memory:  Immediate;   Fair Recent;   Fair Remote;   Fair  Judgement:  Impaired  Insight:  Lacking  Psychomotor Activity:  Increased  Concentration:  Fair  Recall:  [id number] of Knowledge:Fair  Language: Good  Akathisia:  No  Handed:  Right  AIMS (if indicated):     Assets:  Communication Skills Desire for Improvement Leisure Time Physical Health Resilience  Sleep:  Number of Hours: 0.75   Musculoskeletal: Strength & Muscle Tone: within normal limits Gait & Station: normal Patient leans: N/A  Current Medications: Current Facility-Administered Medications  Medication Dose Route Frequency Provider Last Rate  Last Dose  . acetaminophen (TYLENOL) tablet 650 mg  650 mg Oral Q6H PRN Kerry Hough, PA-C      . alum & mag hydroxide-simeth (MAALOX/MYLANTA) 200-200-20 MG/5ML suspension 30 mL  30 mL Oral Q4H PRN Kerry Hough, PA-C      . amantadine (SYMMETREL) capsule 100 mg  100 mg Oral BID Mojeed Akintayo   100 mg at 06/01/14 0750  . ARIPiprazole (ABILIFY) tablet 5 mg  5 mg Oral BID PC Mojeed Akintayo   5 mg at 06/01/14 8295  . hydrOXYzine (ATARAX/VISTARIL) tablet 25 mg  25 mg Oral TID PRN  Kerry Hough, PA-C   25 mg at 05/30/14 2106  . lamoTRIgine (LAMICTAL) tablet 150 mg  150 mg Oral Daily Kerry Hough, PA-C   150 mg at 06/01/14 0750  . magnesium hydroxide (MILK OF MAGNESIA) suspension 30 mL  30 mL Oral Daily PRN Kerry Hough, PA-C   30 mL at 05/31/14 2003  . traZODone (DESYREL) tablet 200 mg  200 mg Oral QHS Kerry Hough, PA-C   200 mg at 05/31/14 2124    Lab Results:  No results found for this or any previous visit (from the past 48 hour(s)).  Physical Findings: AIMS: Facial and Oral Movements Muscles of Facial Expression: None, normal Lips and Perioral Area: None, normal Jaw: None, normal Tongue: None, normal,Extremity Movements Upper (arms, wrists, hands, fingers): None, normal Lower (legs, knees, ankles, toes): None, normal, Trunk Movements Neck, shoulders, hips: None, normal, Overall Severity Severity of abnormal movements (highest score from questions above): None, normal Incapacitation due to abnormal movements: None, normal Patient's awareness of abnormal movements (rate only patient's report): No Awareness, Dental Status Current problems with teeth and/or dentures?: No Does patient usually wear dentures?: No  CIWA:    COWS:     Treatment Plan Summary: Daily contact with patient to assess and evaluate symptoms and progress in treatment Medication management  Plan:  1. Continue crisis management and stabilization.  2. Medication management:  -Continue Amantadine 100 mg BID for EPS prevention -Continue Abilify 5 mg BID for psychosis -Continue Vistaril 25 mg tid prn anxiety -Continue Lamictal 150 mg daily for improved mood stability -Continue Trazodone 200 mg hs for insomnia  3. Encouraged patient to attend groups and participate in group counseling sessions and activities.  4. Discharge plan in progress.  5. Continue current treatment plan.  6. Address health issues: Vitals reviewed and stable. 7. Do Not Admit due to disorganized behavior    Medical Decision Making Problem Points:  Established problem, worsening (2), Review of last therapy session (1) and Review of psycho-social stressors (1) Data Points:  Order Aims Assessment (2) Review or order clinical lab tests (1) Review of medication regiment & side effects (2) Review of new medications or change in dosage (2)  I certify that inpatient services furnished can reasonably be expected to improve the patient's condition.   Kymora Sciara T.  06/01/2014, 12:21 PM

## 2014-06-01 NOTE — Progress Notes (Signed)
Patient ID: Natalie [name], female   DOB: November 02, 1991, 23 y.o.   MRN: [id number] D. Patient agitated pacing hallway and mumbling to self. She approached Clinical research associate and other staff asking bizarre questions, stating '' I want one of these for this, and looking down hallway, responding to internal stimuli. Pt clearly psychotic and unable to tolerate any stimulation on the unit, as staff attempted to engage with patient she became more irritable, paranoid and agitated. A. Notified Molli Knock NP of above information and orders received. Pt refusing to accept po prn medications for agitation as well as her evening meds. Orders received for prn injectable zyprexa 10 mg once. Pt was educated and encouraged to take po meds but continued to refuse, show of support provided and patient accepted injection without issue. R. Patient is currently sitting in dayroom in no acute distress at this time. Will continue to monitor q 15 minutes as ordered.
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
## 2014-06-01 NOTE — Progress Notes (Signed)
Psychoeducational Group Note  Date:  06/01/2014 Time:  2126  Group Topic/Focus:  Wrap-Up Group:   The focus of this group is to help patients review their daily goal of treatment and discuss progress on daily workbooks.  Participation Level: Did Not Attend  Participation Quality:  Not Applicable  Affect:  Not Applicable  Cognitive:  Not Applicable  Insight:  Not Applicable  Engagement in Group: Not Applicable  Additional Comments:  The patient did not attend group this evening and elected to sleep in her chair.   Hazle Coca S 06/01/2014, 9:27 PM

## 2014-06-01 NOTE — Progress Notes (Signed)
Patient has slept only 45" tonight and has been observed standing and staring in her room, bathroom and hallway. While she makes eye contact, she is selectively mute and does not respond to attempts to assist her. Natalie [name]

## 2014-06-01 NOTE — [facility] Group Notes (Signed)
[facility] LCSW Aftercare Discharge Planning Group Note   06/01/2014 11:26 AM  Participation Quality:  Did not attend    [place]

## 2014-06-01 NOTE — Plan of Care (Signed)
Problem: Alteration in mood Goal: STG-Patient is able to discuss feelings and issues (Patient is able to discuss feelings and issues leading to depression)  Outcome: Not Progressing Pt electively mute with this Clinical research associate. Pt stands and stares blankly at staff tonight.  Problem: Alteration in thought process Goal: STG-Patient is able to sleep at least 6 hours per night Outcome: Not Progressing Pt has slept only 3 or so hours the last 2 nights and has yet to sleep tonight.

## 2014-06-01 NOTE — Progress Notes (Signed)
Writer took evening meds to pt's room where she has been sitting in the chair all evening.  Writer tried to initiate a conversation with pt, but pt would only stare at [employer].  Pt allowed writer to scan her wrist band and then writer scanned the meds in the pt's presence.  Pt took the cup, but would only stare at the meds, hold the cup in the air, and was trying to see what was written on the pills.  Writer allowed pt to look at the packaging and reminded pt that she had observed Clinical research associate open the packages.  Pt was also reminded that these were the same meds she had been given for the last two nights.  Pt took at least 10 minutes to take the medications in her mouth, sometimes looking at writer and smiling in a bizarre way, then shaking her head "no".  Pt very much paranoid concerning writer and medications, but she eventually took them with some coaxing.  Writer stayed in the room until pt had time to swallow them.  Writer then asked pt if she had a snack earlier, and pt would just stare at [employer].  After some coaxing, pt said she would like some pretzels.  Writer took pretzels and fruit punch to pt.  She took the items, but then in about 5 minutes, pt came out into the hallway and said "I can't do this".  When writer asked pt to explain what she meant, pt would not say anything else.  Pt then said she wanted to leave and "what about my rights?".  Writer explained to pt that the MD was the one to discharge her and she could not leave without a discharge order.  Writer also explained that the MD was not present and she would have to stay the night and see the MD in the morning.  Pt was clearly frustrated at writer's answer and continued to stand in the hallway and stare blankly.  Pt was encouraged to take the snacks back to her room and eat, but pt is refusing.  Pt continues to be monitored by staff q15 minutes for safety.

## 2014-06-01 NOTE — [facility] Group Notes (Signed)
[facility] LCSW Group Therapy  06/01/2014  1:05 PM  Type of Therapy:  Group therapy  Participation Level:  Did not attend  Stood at the door a while trying to decide whether to stay or not.  Despite encouragement from peers, she ultimately decided to leave.     Summary of Progress/Problems:  Chaplain was here to lead a group on themes of hope and courage.  Daryel Gerald B 06/01/2014 1:16 PM

## 2014-06-01 NOTE — Progress Notes (Signed)
Pt continues to stand motionless in room with blank stare. When asked if she has needs, she does not respond. Pt reassured of her safety. Level III obs in place for safety and pt is safe. Will continue to monitor closely.Zachery Conch, Levell July

## 2014-06-02 MED ORDER — ARIPIPRAZOLE 10 MG PO TABS
10.0000 mg | ORAL_TABLET | Freq: Two times a day (BID) | ORAL | Status: DC
  Administered 2014-06-02 – 2014-06-05 (×6): 10 mg via ORAL
  Filled 2014-06-02 (×7): qty 1
  Filled 2014-06-02: qty 28
  Filled 2014-06-02 (×2): qty 1
  Filled 2014-06-02: qty 28

## 2014-06-02 MED ORDER — LAMOTRIGINE 100 MG PO TABS
200.0000 mg | ORAL_TABLET | Freq: Every day | ORAL | Status: DC
  Administered 2014-06-03 – 2014-06-05 (×3): 200 mg via ORAL
  Filled 2014-06-02 (×2): qty 1
  Filled 2014-06-02: qty 28
  Filled 2014-06-02 (×2): qty 1

## 2014-06-02 MED ORDER — OLANZAPINE 10 MG IM SOLR
INTRAMUSCULAR | Status: AC
  Administered 2014-06-02: 10 mg
  Filled 2014-06-02: qty 10

## 2014-06-02 NOTE — Progress Notes (Signed)
[facility] Group Notes:  (Nursing/MHT/Case Management/Adjunct)  Date:  06/02/2014  Time:  9:39 PM  Type of Therapy:  Psychoeducational Skills  Participation Level:  Minimal  Participation Quality:  Inattentive  Affect:  Depressed  Cognitive:  Lacking  Insight:  Limited  Engagement in Group:  Lacking  Modes of Intervention:  Education  Summary of Progress/Problems: The patient was very guarded in group as she made it known that she did not wish to participate in group. She only shared in group that she was trying to work on taking care of herself. She states that she does not have a support system (theme of the day) as of this time.   Hazle Coca S 06/02/2014, 9:39 PM

## 2014-06-02 NOTE — [facility] Group Notes (Signed)
[facility] Group Notes:  (Nursing/MHT/Case Management/Adjunct)  Date:  06/02/2014  Time:  11:01 AM  Type of Therapy:  Coping Skills  Participation Level:  Did Not Attend  Participation Quality:    Affect:   Cognitive:  Insight:    Engagement in Group:    Modes of Intervention:    Summary of Progress/Problems:  Loren Racer 06/02/2014, 11:01 AM

## 2014-06-02 NOTE — [facility] Group Notes (Signed)
[facility] Group Notes:  (Clinical Social Work)  06/02/2014  11:00-11:45AM  Summary of Progress/Problems:   The main focus of today's process group was for the patient to identify ways in which they have in the past sabotaged their own recovery and reasons they may have done this/what they received from doing it.  We then worked to identify a specific plan to avoid doing this when discharged from the hospital for this admission.  The patient expressed in group only that she was angry at being in the hospital, and wants to be discharged.  When she was first offered the opportunity to share her self-sabotage with the group, she stared at CSW in defiant, angry manner.  She then asked to see her Child psychotherapist.  CSW did meet with her in her room and she wanted an explanation of her Involuntary status.  She wanted to see the paperwork she signed upon admission to the hospital and CSW confirmed with Dr. Dub Mikes that this was fine.  Type of Therapy:  Group Therapy - Process  Participation Level:  None  Participation Quality:  Resistant  Affect:  Angry  Cognitive:  Delusional  Insight:  None  Engagement in Therapy:  None  Modes of Intervention:  Clarification, Education, Exploration, Discussion  Ambrose Mantle, LCSW 06/02/2014, 1:03 PM

## 2014-06-02 NOTE — Progress Notes (Signed)
Pt stood in the hallway for a good amount of time, staring up the hall, but at this time, pt is lying down in her bed.  Staff continues to monitor pt for safety q15 minutes.

## 2014-06-02 NOTE — Plan of Care (Signed)
Problem: Alteration in thought process Goal: STG-Patient is able to discuss thoughts with staff Outcome: Not Progressing Patient remains suspicious and guarded. Does not forward much information 1:1 or otherwise and remains focused on discharge. Goal: STG-Patient does not respond to command hallucinations Outcome: Not Progressing Patient continues to deny AVH however is clearly responding to internal stimuli and displays thought blocking.  Problem: Ineffective individual coping Goal: STG-Other (Specify): Outcome: Not Progressing Patient refusing medications at times. Remains suspicious regarding meds even when education is given along with showing patient the packaging.

## 2014-06-02 NOTE — Progress Notes (Signed)
Patient ID: Natalie [name], female   DOB: 09-26-1991, 23 y.o.   MRN: [id number] D: Patient resistant to treatment. Uncooperative with staff. Refused AM meds and was given injection. Agreed to take 12 PM meds after meeting with PA. Continues to state she doesn't belong here. Paranoid about medications, what they are for and times they are given. Understands that she must follow treatment recommendations in order to be discharged. Continues to gaze at staff with blank stare but has shown she is very capable of conversing logically and fluidly.  A: Patient given emotional support from RN. Medication information provided to client so she can understand each meds purpose.  R: Patient continues to need redirection and encouragement.. Will continue to monitor patient for safety.

## 2014-06-02 NOTE — Progress Notes (Signed)
Patient ID: Natalie [name], female   DOB: May 21, 1991, 23 y.o.   MRN: [id number] [facility] MD Progress Note  06/02/2014 9:39 AM Natalie [name]  MRN:  [id number]  Subjective:  Natalie [name] continues to refuse medications and has been inconsistent since admission on taking them per nursing staff. States she doesn't want to take all this medication today, its confusing and doesn't help. She says she has done any number of things to "get out of the hospital" but "is watching what she says because she gets into trouble, and she has forgotten that part of being in the hospital."    Objective:  Patient is seen and chart is reviewed. Patient continues to be resistant to treatment. She continues to be delusional stating that she doesn't need all of this medication. She shows poor insight and poor judgement. She continues to be guarded and minimally cooperative giving only bits of information. Continues to be paranoid, delusional, with anger and mood lability. Her speech is clear and goal directed.    DSM5: Total Time spent with patient: 30 minutes AXIS I: Bipolar 1 disorder, most recent episode mixed with psychosis  AXIS II: Cluster B Traits  AXIS III:  Past Medical History   Diagnosis  Date   .  Psychiatric diagnosis    .  Chronic bipolar disorder    .  OCD (obsessive compulsive disorder)    .  H/O suicide attempt  05-2013     OD   .  Anxiety     AXIS IV: economic problems, housing problems, occupational problems, other psychosocial or environmental problems, problems with access to health care services and problems with primary support group  AXIS V: 31-40 impairment in reality testing   ADL's:  Intact  Sleep: Fair  Appetite:  Fair  Suicidal Ideation:  Denies Homicidal Ideation:  Denies AEB (as evidenced by):  Psychiatric Specialty Exam: Physical Exam  Review of Systems  Constitutional: Negative.   HENT: Negative.   Eyes: Negative.   Respiratory: Negative.   Cardiovascular:  Negative.   Gastrointestinal: Negative.   Genitourinary: Negative.   Musculoskeletal: Negative.   Skin: Negative.   Neurological: Negative.   Endo/Heme/Allergies: Negative.   Psychiatric/Behavioral: Positive for depression and hallucinations. The patient is nervous/anxious.     Blood pressure 117/73, pulse 93, temperature 98.3 F (36.8 C), temperature source Oral, resp. rate 18, height 5\' 4"  (1.626 m), weight 195 lb (88.451 kg), last menstrual period 05/26/2014.Body mass index is 33.46 kg/(m^2).  General Appearance: Disheveled and Guarded  05/28/2014::  Fair  Speech:  Pressured  Volume:  Increased  Mood:  Angry and Anxious  Affect:  Labile  Thought Process:  Circumstantial  Orientation:  Full (Time, Place, and Person)  Thought Content:  Delusions and Paranoid Ideation  Suicidal Thoughts:  No  Homicidal Thoughts:  No  Memory:  Immediate;   Fair Recent;   Fair Remote;   Fair  Judgement:  Impaired  Insight:  Lacking  Psychomotor Activity:  Increased  Concentration:  Fair  Recall:  [id number] of Knowledge:Fair  Language: Good  Akathisia:  No  Handed:  Right  AIMS (if indicated):     Assets:  Communication Skills Desire for Improvement Leisure Time Physical Health Resilience  Sleep:  Number of Hours: 3.75   Musculoskeletal: Strength & Muscle Tone: within normal limits Gait & Station: normal Patient leans: N/A  Current Medications: Current Facility-Administered Medications  Medication Dose Route Frequency Provider Last Rate Last Dose  . acetaminophen (TYLENOL) tablet  650 mg  650 mg Oral Q6H PRN Kerry Hough, PA-C      . alum & mag hydroxide-simeth (MAALOX/MYLANTA) 200-200-20 MG/5ML suspension 30 mL  30 mL Oral Q4H PRN Kerry Hough, PA-C      . amantadine (SYMMETREL) capsule 100 mg  100 mg Oral BID Mojeed Akintayo   100 mg at 06/01/14 0750  . ARIPiprazole (ABILIFY) tablet 5 mg  5 mg Oral BID PC Mojeed Akintayo   5 mg at 06/01/14 4097  . hydrOXYzine  (ATARAX/VISTARIL) tablet 25 mg  25 mg Oral TID PRN Kerry Hough, PA-C   25 mg at 05/30/14 2106  . lamoTRIgine (LAMICTAL) tablet 150 mg  150 mg Oral Daily Kerry Hough, PA-C   150 mg at 06/01/14 0750  . magnesium hydroxide (MILK OF MAGNESIA) suspension 30 mL  30 mL Oral Daily PRN Kerry Hough, PA-C   30 mL at 05/31/14 2003  . OLANZapine (ZYPREXA) 10 MG injection           . OLANZapine zydis (ZYPREXA) disintegrating tablet 10 mg  10 mg Oral TID PRN [name], NP      . traZODone (DESYREL) tablet 200 mg  200 mg Oral QHS Kerry Hough, PA-C   200 mg at 06/01/14 2200    Lab Results:  No results found for this or any previous visit (from the past 48 hour(s)).  Physical Findings: AIMS: Facial and Oral Movements Muscles of Facial Expression: None, normal Lips and Perioral Area: None, normal Jaw: None, normal Tongue: None, normal,Extremity Movements Upper (arms, wrists, hands, fingers): None, normal Lower (legs, knees, ankles, toes): None, normal, Trunk Movements Neck, shoulders, hips: None, normal, Overall Severity Severity of abnormal movements (highest score from questions above): None, normal Incapacitation due to abnormal movements: None, normal Patient's awareness of abnormal movements (rate only patient's report): No Awareness, Dental Status Current problems with teeth and/or dentures?: No Does patient usually wear dentures?: No  CIWA:    COWS:     Treatment Plan Summary: Daily contact with patient to assess and evaluate symptoms and progress in treatment Medication management  Plan: 1. Continue crisis management and stabilization.  2. Medication management:  -Continue Amantadine 100 mg BID for EPS prevention -Continue Abilify 5 mg BID for psychosis -Continue Vistaril 25 mg tid prn anxiety -Continue Lamictal 150 mg daily for improved mood stability -Continue Trazodone 200 mg hs for insomnia  3. Encouraged patient to attend groups and participate in group counseling  sessions and activities.  4. Discharge plan in progress.  5. Continue current treatment plan.  6. Address health issues: Vitals reviewed and stable. 7. Do Not Admit due to disorganized behavior  NEW: 1. Will increase Abilify to 10mg  po BID for anxiety and depression. 2. Will add Ativan 1mg  po q 8 hrs. PRN for anxiety and agitation. 3. Will increase Lamictal to 200mg  po qd. For mood stability. 4. Will d/c Tegratol to avoid SJ syndrome. 5. Will ask for Forced medication order if patient continues to refuse meds. 6. Will continue the DNA order at this time. Medical Decision Making Problem Points:  Established problem, worsening (2), Review of last therapy session (1) and Review of psycho-social stressors (1) Data Points:  Order Aims Assessment (2) Review or order clinical lab tests (1) Review of medication regiment & side effects (2) Review of new medications or change in dosage (2)  I certify that inpatient services furnished can reasonably be expected to improve the patient's condition.  .  [place] RPAC 12:40 PM 06/02/2014 I agree with assessment and plan Madie Reno A. Dub Mikes, M.D.

## 2014-06-02 NOTE — [facility] Group Notes (Signed)
[facility] Group Notes:  (Nursing/MHT/Case Management/Adjunct)  Date:  06/02/2014  Time:  10:58 AM  Type of Therapy:  Self Inventory  Participation Level:  Did Not Attend  Participation Quality:   did not attend  Affect:  non attendant  Cognitive:  did not attend  Insight:  None  Engagement in Group:  Did not attend  Modes of Intervention:  not in attendance  Summary of Progress/Problems:  Natalie [name] 06/02/2014, 10:58 AM

## 2014-06-02 NOTE — Progress Notes (Signed)
Patient remains guarded, suspicious and preoccupied. She is watchful of staff and the goings on of the unit. Spoke with patient 1:1 and explained medications for this evening. She was agreeable to taking trazadone as she indicated, "yes I will take that as it is one of my regular meds." Offered her 1700 meds that she refused with much reassurance they would not harm her. "That's just too much medication. I've been told not to take them late." Patient continues to deny AVH though appears to respond to internal stimuli. Thought blocking evident. She denies SI/HI and remains focused on discharge. "I just don't feel that I should be here. This is not how I want to spend my days." Lawrence Marseilles

## 2014-06-03 MED ORDER — DIPHENHYDRAMINE HCL 50 MG/ML IJ SOLN
50.0000 mg | Freq: Once | INTRAMUSCULAR | Status: DC
  Filled 2014-06-03: qty 1

## 2014-06-03 MED ORDER — ZIPRASIDONE MESYLATE 20 MG IM SOLR
10.0000 mg | Freq: Once | INTRAMUSCULAR | Status: DC
  Filled 2014-06-03: qty 20

## 2014-06-03 MED ORDER — LORAZEPAM 1 MG PO TABS
1.0000 mg | ORAL_TABLET | ORAL | Status: DC
  Administered 2014-06-03 – 2014-06-05 (×7): 1 mg via ORAL
  Filled 2014-06-03 (×7): qty 1

## 2014-06-03 NOTE — Progress Notes (Signed)
Patient ID: Natalie [name], female   DOB: Jan 16, 1991, 23 y.o.   MRN: [id number] Psychoeducational Group Note  Date:  06/03/2014 Time:  0930am  Group Topic/Focus:  Making Healthy Choices:   The focus of this group is to help patients identify negative/unhealthy choices they were using prior to admission and identify positive/healthier coping strategies to replace them upon discharge.  Participation Level:  Active  Participation Quality:  Resistant  Affect:  Depressed and Labile  Cognitive:  Disorganized  Insight:  Resistant  Engagement in Group:  Resistant  Additional Comments:  Healthy support systems.   Valente David 06/03/2014,1:52 PM

## 2014-06-03 NOTE — Plan of Care (Signed)
Problem: Alteration in mood; excessive anxiety as evidenced by: Goal: STG-Pt will report an absence of self-harm thoughts/actions (Patient will report an absence of self-harm thoughts or actions)  Outcome: Progressing Pt denies any SI and has not engaged in any self harm on the unit.  Problem: Alteration in thought process Goal: STG-Patient is able to follow short directions Outcome: Progressing Patient more spontaneous tonight in her communication and movements. Took meds without prompting or inspection.

## 2014-06-03 NOTE — Progress Notes (Signed)
Patient ID: CAROL THEYS, female   DOB: 07-27-1991, 23 y.o.   MRN: [id number] Psychoeducational Group Note  Date:  06/03/2014 Time:  0910am  Group Topic/Focus:  Making Healthy Choices:   The focus of this group is to help patients identify negative/unhealthy choices they were using prior to admission and identify positive/healthier coping strategies to replace them upon discharge.  Participation Level:  Active  Participation Quality:  Resistant  Affect:  Flat  Cognitive:  Disorganized  Insight:  Resistant  Engagement in Group:  Resistant  Additional Comments:  Inventory group   Valente David 06/03/2014,1:50 PM

## 2014-06-03 NOTE — Progress Notes (Signed)
Patient ID: Natalie [name], female   DOB: 09-29-1991, 23 y.o.   MRN: [id number] 06-03-14@ nursing shift note: D: this pt remains suspicious and paranoid today. She stares as though she is preoccupied. She denies any si/hi. A: since she had a brief negative issue in the cafeteria last night, not following directions, the RN obtained an order for her to have meals on the unit. She will not be able to go to the cafeteria or recreation. extender placed an order for ativan for this pt for anxiety. Her no roommate order was discontinued. R: on her inventory she wrote: sleep well, appetite good, energy normal, attention good with her depression and hopelessness both at 1. No withdrawal symptoms. No complaints of pain and has not voiced any needs. She has no had any physical problems in the last 24 hrs. After discharge she plans to " get in a positive environment and get work".   Pt continues to have some trouble processing information. RN will monitor and Q 15 min ck's continue.

## 2014-06-03 NOTE — Clinical Social Work Note (Signed)
Clinical Social Work Note  At patient's request, after checking with Dr. Dub Mikes, provided [place] with copies of her legal documents and answered all questions she had about it.  Ambrose Mantle, LCSW 06/03/2014, 1:12 PM

## 2014-06-03 NOTE — Progress Notes (Signed)
Pt showing improvement thus far tonight. She is less paranoid, watchful and irritable. Acknowledged she has not been as vested in her treatment as she needs to be. Also states she realizes she needs the prescribed meds. Her affect and actions are more spontaneous. Patient even smiled at times during 1:1 with this Clinical research associate. Order had been obtained by day RN at 1200 for one time IMs for agitation which were not needed on day shift. Admin time was rescheduled to 2000 however patient has been cooperative and calm and has not shown any agitation therefore IMs not needed/given. She was med compliant with her scheduled meds at hs. Pt praised and supported. Given med education but took meds without hesitation. She denies SI/HI/AVH and remains safe. Lawrence Marseilles

## 2014-06-03 NOTE — Progress Notes (Signed)
Patient ID: Natalie [name], female   DOB: Aug 29, 1991, 23 y.o.   MRN: [id number] [facility] MD Progress Note  06/03/2014 3:34 PM Natalie [name]  MRN:  [id number]  Subjective: Patient is seen and states she is much better today.  She had a brief issue last pm in the cafeteria where she was not following the rules, but did not CIRT and was able to return to the unit without issues. She states she is sleeping well and eating well but documentation states she is only getting 3-4 hours of sleep each night. Objective:  Patient is seen and chart is reviewed. Patient continues to be resistant to treatment. She continues to be delusional stating that she doesn't need all of this medication. She continues to show poor insight and poor judgement. She continues to be guarded and minimally cooperative giving only bits of information. Continues to be paranoid, delusional, with anger and mood lability. Her speech is clear and goal directed. She has been compliant with her medications today, but has participated in some groups, but states clearly she has to be able to "finish" the group. She still continues to minimize her symptoms, and does appear to be responding to internal stimulation. She remains guarded and paranoid.  DSM5: Total Time spent with patient: 30 minutes AXIS I: Bipolar 1 disorder, most recent episode mixed with psychosis  AXIS II: Cluster B Traits  AXIS III:  Past Medical History   Diagnosis  Date   .  Psychiatric diagnosis    .  Chronic bipolar disorder    .  OCD (obsessive compulsive disorder)    .  H/O suicide attempt  05-2013     OD   .  Anxiety     AXIS IV: economic problems, housing problems, occupational problems, other psychosocial or environmental problems, problems with access to health care services and problems with primary support group  AXIS V: 31-40 impairment in reality testing   ADL's:  Intact  Sleep: Fair  Appetite:  Fair  Suicidal Ideation:  Denies Homicidal  Ideation:  Denies AEB (as evidenced by):  Psychiatric Specialty Exam: Physical Exam  Review of Systems  Constitutional: Negative.   HENT: Negative.   Eyes: Negative.   Respiratory: Negative.   Cardiovascular: Negative.   Gastrointestinal: Negative.   Genitourinary: Negative.   Musculoskeletal: Negative.   Skin: Negative.   Neurological: Negative.   Endo/Heme/Allergies: Negative.   Psychiatric/Behavioral: Positive for depression and hallucinations. The patient is nervous/anxious.     Blood pressure 97/63, pulse 94, temperature 98.8 F (37.1 C), temperature source Oral, resp. rate 20, height 5\' 4"  (1.626 m), weight 88.451 kg (195 lb), last menstrual period 05/26/2014.Body mass index is 33.46 kg/(m^2).  General Appearance: Disheveled and Guarded  05/28/2014::  Fair  Speech:  Pressured  Volume:  Increased  Mood:  Angry and Anxious  Affect:  Labile  Thought Process:  Circumstantial  Orientation:  Full (Time, Place, and Person)  Thought Content:  Delusions and Paranoid Ideation  Suicidal Thoughts:  No  Homicidal Thoughts:  No  Memory:  Immediate;   Fair Recent;   Fair Remote;   Fair  Judgement:  Impaired  Insight:  Lacking  Psychomotor Activity:  Increased  Concentration:  Fair  Recall:  [id number] of Knowledge:Fair  Language: Good  Akathisia:  No  Handed:  Right  AIMS (if indicated):     Assets:  Communication Skills Desire for Improvement Leisure Time Physical Health Resilience  Sleep:  Number of Hours: 4  Musculoskeletal: Strength & Muscle Tone: within normal limits Gait & Station: normal Patient leans: N/A  Current Medications: Current Facility-Administered Medications  Medication Dose Route Frequency Provider Last Rate Last Dose  . acetaminophen (TYLENOL) tablet 650 mg  650 mg Oral Q6H PRN Kerry Hough, PA-C      . alum & mag hydroxide-simeth (MAALOX/MYLANTA) 200-200-20 MG/5ML suspension 30 mL  30 mL Oral Q4H PRN Kerry Hough, PA-C      .  amantadine (SYMMETREL) capsule 100 mg  100 mg Oral BID Mojeed Akintayo   100 mg at 06/03/14 0740  . ARIPiprazole (ABILIFY) tablet 10 mg  10 mg Oral BID PC Verne Spurr, PA-C   10 mg at 06/03/14 0753  . diphenhydrAMINE (BENADRYL) injection 50 mg  50 mg Intramuscular Once Verne Spurr, PA-C      . hydrOXYzine (ATARAX/VISTARIL) tablet 25 mg  25 mg Oral TID PRN Kerry Hough, PA-C   25 mg at 05/30/14 2106  . lamoTRIgine (LAMICTAL) tablet 200 mg  200 mg Oral Daily Verne Spurr, PA-C   200 mg at 06/03/14 0740  . magnesium hydroxide (MILK OF MAGNESIA) suspension 30 mL  30 mL Oral Daily PRN Kerry Hough, PA-C   30 mL at 05/31/14 2003  . OLANZapine zydis (ZYPREXA) disintegrating tablet 10 mg  10 mg Oral TID PRN [name], NP      . traZODone (DESYREL) tablet 200 mg  200 mg Oral QHS Kerry Hough, PA-C   200 mg at 06/02/14 2054  . ziprasidone (GEODON) injection 10 mg  10 mg Intramuscular Once Verne Spurr, PA-C        Lab Results:  No results found for this or any previous visit (from the past 48 hour(s)).  Physical Findings: AIMS: Facial and Oral Movements Muscles of Facial Expression: None, normal Lips and Perioral Area: None, normal Jaw: None, normal Tongue: None, normal,Extremity Movements Upper (arms, wrists, hands, fingers): None, normal Lower (legs, knees, ankles, toes): None, normal, Trunk Movements Neck, shoulders, hips: None, normal, Overall Severity Severity of abnormal movements (highest score from questions above): None, normal Incapacitation due to abnormal movements: None, normal Patient's awareness of abnormal movements (rate only patient's report): No Awareness, Dental Status Current problems with teeth and/or dentures?: No Does patient usually wear dentures?: No  CIWA:    COWS:     Treatment Plan Summary: Daily contact with patient to assess and evaluate symptoms and progress in treatment Medication management  Plan: NEW: 1. Will continue Abilify to 10mg  po  BID for anxiety and depression. 2. Will continue Ativan 1mg  po q 8 hrs. PRN for anxiety and agitation. 3. Will increase Lamictal to 200mg  po qd. For mood stability. 4. Will d/c Tegratol to avoid SJ syndrome. 5. Will ask for Forced medication order if patient continues to refuse meds. 6. Will D/C do not admit order due to need for room changes for another patient. 7. Would change Ativan to schedule due to her irritability. Medical Decision Making Problem Points:  Established problem, worsening (2), Review of last therapy session (1) and Review of psycho-social stressors (1) Data Points:  Order Aims Assessment (2) Review or order clinical lab tests (1) Review of medication regiment & side effects (2) Review of new medications or change in dosage (2)  I certify that inpatient services furnished can reasonably be expected to improve the patient's condition.  . Mashburn RPAC 3:34 PM 06/03/2014 I agree with assessment and plan A. Rona Ravens, M.D.

## 2014-06-03 NOTE — Progress Notes (Signed)
[facility] Group Notes:  (Nursing/MHT/Case Management/Adjunct)  Date:  06/03/2014  Time:  9:41 PM  Type of Therapy:  Psychoeducational Skills  Participation Level:  Active  Participation Quality:  Attentive  Affect:  Appropriate  Cognitive:  Appropriate  Insight:  Improving  Engagement in Group:  Developing/Improving  Modes of Intervention:  Education  Summary of Progress/Problems: The patient expressed in group this evening that she worked on talking more with her peers and with the staff. She states that she enjoyed attending the music group and that she understands the importance of her participating more on the unit. In terms of the theme for the day, her coping skills are as follows: stay on her medication, take the medication on time without skipping dosages, and working on keeping her attitude up versus being negative.   Hazle Coca S 06/03/2014, 9:41 PM

## 2014-06-03 NOTE — [facility] Group Notes (Signed)
[facility] Group Notes:  (Clinical Social Work)  06/03/2014   11:15am-12:00pm  Summary of Progress/Problems:  The main focus of today's process group was to listen to a variety of genres of music and to identify that different types of music provoke different responses.  The patient then was able to identify personally what was soothing for them, as well as energizing.  Handouts were used to record feelings evoked, as well as how patient can personally use this knowledge in sleep habits, with depression, and with other symptoms.  The patient was hesitant to describe how each type of music made her feel.  She ended up leaving the group at about the halfway point, and later told CSW she did not understand the instructions.  Type of Therapy:  Music Therapy   Participation Level:  Minimal  Participation Quality:  Resistant  Affect:  Flat  Cognitive:  Oriented  Insight:  Limited  Engagement in Therapy:  Improving  Modes of Intervention:   Activity, Exploration  Ambrose Mantle, LCSW 06/03/2014, 12:30pm

## 2014-06-04 NOTE — Progress Notes (Addendum)
Pt c/o indigestion and was given 30cc of maalox. Pt stated the maalox made her feel better. She is pleasant and cooperative and contracts for safety. Pt denies SI and HI at this time. Pt is found at times staring at the walls but denies responding to internal stimuli.

## 2014-06-04 NOTE — Tx Team (Signed)
  Interdisciplinary Treatment Plan Update   Date Reviewed:  06/04/2014  Time Reviewed:  8:25 AM  Progress in Treatment:   Attending groups: Yes Participating in groups: Yes Taking medication as prescribed: Yes  Tolerating medication: Yes Family/Significant other contact made: Yes  Patient understands diagnosis: Yes  Discussing patient identified problems/goals with staff: Yes Medical problems stabilized or resolved: Yes Denies suicidal/homicidal ideation: Yes Patient has not harmed self or others: Yes  For review of initial/current patient goals, please see plan of care.  Estimated Length of Stay:  1-2 days  Reason for Continuation of Hospitalization: Medication stabilization  New Problems/Goals identified:  N/A  Discharge Plan or Barriers:   return home, follow up outpt  Additional Comments:  Patient is seen and states she is much better today. She had a brief issue last pm in the cafeteria where she was not following the rules, but did not CIRT and was able to return to the unit without issues. She states she is sleeping well and eating well but documentation states she is only getting 3-4 hours of sleep each night.  Patient is seen and chart is reviewed. Patient continues to be resistant to treatment. She continues to be delusional stating that she doesn't need all of this medication. She continues to show poor insight and poor judgement. She continues to be guarded and minimally cooperative giving only bits of information.   Attendees:  Signature: Thedore Mins, MD 06/04/2014 8:25 AM   Signature: Richelle Ito, LCSW 06/04/2014 8:25 AM  Signature: Fransisca Kaufmann, NP 06/04/2014 8:25 AM  Signature: Joslyn Devon, RN 06/04/2014 8:25 AM  Signature: Liborio Nixon, RN 06/04/2014 8:25 AM  Signature:  06/04/2014 8:25 AM  Signature:   06/04/2014 8:25 AM  Signature:    Signature:    Signature:    Signature:    Signature:    Signature:      Scribe for Treatment Team:   Richelle Ito, LCSW   06/04/2014 8:25 AM

## 2014-06-04 NOTE — Progress Notes (Signed)
Patient ID: Natalie [name], female   DOB: Apr 29, 1991, 23 y.o.   MRN: [id number] D: Patient is less guarded and suspicious today.  She feels like she has improved since her admission.  She states, "I just sit and stare at things so I can focus."  "I feel guilty over my family issues and think I caused them.  I know I'm in a severe situation."  She reports poor sleep and feels "like someone is giving me extra meds."  Patient thought processes remain disorganized and her speech is soft and slow.  She denies any SI/HI/AVH.  A: Continue to monitor medication management and MD orders.  Safety checks completed every 15 minutes per protocol.  R: Patient is receptive to staff; her behavior is appropriate.

## 2014-06-04 NOTE — Progress Notes (Signed)
Child/Adolescent Psychoeducational Group Note  Date:  06/04/2014 Time:  9:13 PM  Group Topic/Focus:  Wrap-Up Group:   The focus of this group is to help patients review their daily goal of treatment and discuss progress on daily workbooks.  Participation Level:  Active  Participation Quality:  Appropriate and Attentive  Affect:  Appropriate  Cognitive:  Appropriate  Insight:  Improving  Engagement in Group:  Improving  Modes of Intervention:  Discussion  Additional Comments:  Pt attended the wrap up group this evening and remained appropriate and engaged throughout the duration of the group. Pt ranked her day as an 8 because she's doing a lot better compared to when she first came to [facility]. Pt shared that one positive thing about her day was that she was able to visit with her husband.   Sheran Lawless 06/04/2014, 9:13 PM

## 2014-06-04 NOTE — [facility] Group Notes (Signed)
[facility] LCSW Group Therapy  06/04/2014 1:15 pm  Type of Therapy: Process Group Therapy  Participation Level:  Active  Participation Quality:  Appropriate  Affect:  Flat  Cognitive:  Oriented  Insight:  Improving  Engagement in Group:  Limited  Engagement in Therapy:  Limited  Modes of Intervention:  Activity, Clarification, Education, Problem-solving and Support  Summary of Progress/Problems: Today's group addressed the issue of overcoming obstacles.  Patients were asked to identify their biggest obstacle post d/c that stands in the way of their on-going success, and then problem solve as to how to manage this.  Natalie [name] states her biggest obstacle is her temper.  "My ears start getting hot, and my blood starts to boil.  have to just walk away and cool down."  As she was discussing, others gave her feedback that, in fact, she is handling this in a positive way.  This appeared to please her.  Natalie [name] B 06/04/2014   2:52 PM

## 2014-06-04 NOTE — Progress Notes (Signed)
Patient ID: Natalie [name], female   DOB: 12/20/1990, 23 y.o.   MRN: [id number] [facility] MD Progress Note  06/04/2014 1:18 PM Natalie [name]  MRN:  [id number]  Subjective:  Patient states "I'm a lot better. I stare at the wall sometimes to sort out my thoughts. I hear people moving in the hall and that scares me. I worry sometimes I am being given the wrong pills. Like there are too many in the cup. My mood fluctuated over the weekend. I did not really act up at all."   Objective:  Patient is seen and chart is reviewed. The patient reports improvement but is unable to explain in what way. Her thought processes remain disorganized at times. She was agitated at times over the weekend and required IM medications to be ordered. Patient is noted to stare as if being internally occupied. She continues to experience psychotic symptoms of paranoia and auditory hallucinations. Patient has limited insight into her illness. She has been complaint with medication except for one refusal over the weekend.   DSM5: Total Time spent with patient: 25 AXIS I: Bipolar 1 disorder, most recent episode mixed with psychosis  AXIS II: Cluster B Traits  AXIS III:  Past Medical History   Diagnosis  Date   .  Psychiatric diagnosis    .  Chronic bipolar disorder    .  OCD (obsessive compulsive disorder)    .  H/O suicide attempt  05-2013     OD   .  Anxiety     AXIS IV: economic problems, housing problems, occupational problems, other psychosocial or environmental problems, problems with access to health care services and problems with primary support group  AXIS V: 31-40 impairment in reality testing   ADL's:  Intact  Sleep: Fair  Appetite:  Fair  Suicidal Ideation:  Denies Homicidal Ideation:  Denies AEB (as evidenced by):  Psychiatric Specialty Exam: Physical Exam  Review of Systems  Constitutional: Negative.   HENT: Negative.   Eyes: Negative.   Respiratory: Negative.   Cardiovascular:  Negative.   Gastrointestinal: Negative.   Genitourinary: Negative.   Musculoskeletal: Negative.   Skin: Negative.   Neurological: Negative.   Endo/Heme/Allergies: Negative.   Psychiatric/Behavioral: Positive for depression and hallucinations. The patient is nervous/anxious.     Blood pressure 91/56, pulse 112, temperature 97.5 F (36.4 C), temperature source Oral, resp. rate 18, height 5\' 4"  (1.626 m), weight 88.451 kg (195 lb), last menstrual period 05/26/2014.Body mass index is 33.46 kg/(m^2).  General Appearance: Disheveled and Guarded  05/28/2014::  Fair  Speech:  Pressured  Volume:  Increased  Mood:  Angry and Anxious  Affect:  Labile  Thought Process:  Circumstantial  Orientation:  Full (Time, Place, and Person)  Thought Content:  Delusions and Paranoid Ideation  Suicidal Thoughts:  No  Homicidal Thoughts:  No  Memory:  Immediate;   Fair Recent;   Fair Remote;   Fair  Judgement:  Impaired  Insight:  Lacking  Psychomotor Activity:  Increased  Concentration:  Fair  Recall:  [id number] of Knowledge:Fair  Language: Good  Akathisia:  No  Handed:  Right  AIMS (if indicated):     Assets:  Communication Skills Desire for Improvement Leisure Time Physical Health Resilience  Sleep:  Number of Hours: 6   Musculoskeletal: Strength & Muscle Tone: within normal limits Gait & Station: normal Patient leans: N/A  Current Medications: Current Facility-Administered Medications  Medication Dose Route Frequency Provider Last Rate Last Dose  .  acetaminophen (TYLENOL) tablet 650 mg  650 mg Oral Q6H PRN Kerry Hough, PA-C      . alum & mag hydroxide-simeth (MAALOX/MYLANTA) 200-200-20 MG/5ML suspension 30 mL  30 mL Oral Q4H PRN Kerry Hough, PA-C      . amantadine (SYMMETREL) capsule 100 mg  100 mg Oral BID Mojeed Akintayo   100 mg at 06/04/14 0754  . ARIPiprazole (ABILIFY) tablet 10 mg  10 mg Oral BID PC Verne Spurr, PA-C   10 mg at 06/04/14 0754  . diphenhydrAMINE  (BENADRYL) injection 50 mg  50 mg Intramuscular Once Verne Spurr, PA-C      . hydrOXYzine (ATARAX/VISTARIL) tablet 25 mg  25 mg Oral TID PRN Kerry Hough, PA-C   25 mg at 05/30/14 2106  . lamoTRIgine (LAMICTAL) tablet 200 mg  200 mg Oral Daily Verne Spurr, PA-C   200 mg at 06/04/14 0754  . LORazepam (ATIVAN) tablet 1 mg  1 mg Oral [id number] Verne Spurr, PA-C   1 mg at 06/04/14 0754  . magnesium hydroxide (MILK OF MAGNESIA) suspension 30 mL  30 mL Oral Daily PRN Kerry Hough, PA-C   30 mL at 06/03/14 1639  . OLANZapine zydis (ZYPREXA) disintegrating tablet 10 mg  10 mg Oral TID PRN [name], NP      . traZODone (DESYREL) tablet 200 mg  200 mg Oral QHS Kerry Hough, PA-C   200 mg at 06/03/14 2120  . ziprasidone (GEODON) injection 10 mg  10 mg Intramuscular Once Verne Spurr, PA-C        Lab Results:  No results found for this or any previous visit (from the past 48 hour(s)).  Physical Findings: AIMS: Facial and Oral Movements Muscles of Facial Expression: None, normal Lips and Perioral Area: None, normal Jaw: None, normal Tongue: None, normal,Extremity Movements Upper (arms, wrists, hands, fingers): None, normal Lower (legs, knees, ankles, toes): None, normal, Trunk Movements Neck, shoulders, hips: None, normal, Overall Severity Severity of abnormal movements (highest score from questions above): None, normal Incapacitation due to abnormal movements: None, normal Patient's awareness of abnormal movements (rate only patient's report): No Awareness, Dental Status Current problems with teeth and/or dentures?: No Does patient usually wear dentures?: No  CIWA:    COWS:     Treatment Plan Summary: Daily contact with patient to assess and evaluate symptoms and progress in treatment Medication management  Plan: NEW: 1. Continue Abilify to 10mg  po BID for psychosis.  2. Will continue Ativan 1mg  po q 8 hrs for anxiety and agitation  3. Continue Lamictal to 200mg  po qd.  For mood stability. 4. Address health issues: Vitals reviewed and stable.  5. Will ask for Forced medication order if patient continues to refuse meds. 6. Encourage the patient to attend groups and participate on the unit.   Medical Decision Making Problem Points:  Established problem, worsening (2), Review of last therapy session (1) and Review of psycho-social stressors (1) Data Points:  Order Aims Assessment (2) Review or order clinical lab tests (1) Review of medication regiment & side effects (2) Review of new medications or change in dosage (2)  I certify that inpatient services furnished can reasonably be expected to improve the patient's condition.  NP-C 1:18 PM 06/04/2014 I agree with assessment and plan . Fransisca Kaufmann, M.D.

## 2014-06-05 DIAGNOSIS — F3160 Bipolar disorder, current episode mixed, unspecified: Secondary | ICD-10-CM

## 2014-06-05 DIAGNOSIS — F429 Obsessive-compulsive disorder, unspecified: Secondary | ICD-10-CM

## 2014-06-05 MED ORDER — ARIPIPRAZOLE 10 MG PO TABS: 10.0000 mg | ORAL_TABLET | Freq: Two times a day (BID) | ORAL | Status: DC

## 2014-06-05 MED ORDER — LAMOTRIGINE 200 MG PO TABS: 200.0000 mg | ORAL_TABLET | Freq: Every day | ORAL | Status: DC

## 2014-06-05 MED ORDER — TRAZODONE HCL 100 MG PO TABS: 200.0000 mg | ORAL_TABLET | Freq: Every day | ORAL | Status: DC

## 2014-06-05 MED ORDER — AMANTADINE HCL 100 MG PO CAPS: 100.0000 mg | ORAL_CAPSULE | Freq: Two times a day (BID) | ORAL | Status: DC

## 2014-06-05 NOTE — Progress Notes (Signed)
[facility] Adult Case Management Discharge Plan :  Will you be returning to the same living situation after discharge: Yes,  home At discharge, do you have transportation home?:Yes,  family Do you have the ability to pay for your medications:Yes,  mental health  Release of information consent forms completed and in the chart;  Patient's signature needed at discharge.  Patient to Follow up at: Follow-up Information   Follow up with [facility]. (Go to the walk-in clinic M-F between 8 and9 AM for your hospital follow up appointment)    Contact information:   [address]  [place] [phone]      Patient denies SI/HI:   Yes,  yes    Safety Planning and Suicide Prevention discussed:  Yes,  yes  Ida Rogue 06/05/2014, 12:45 PM

## 2014-06-05 NOTE — [facility] Suicide Risk Assessment (Signed)
   Demographic Factors:  Low socioeconomic status and Unemployed  Total Time spent with patient: 20 minutes  Psychiatric Specialty Exam: Physical Exam  Psychiatric: She has a normal mood and affect. Her speech is normal and behavior is normal. Judgment and thought content normal. Cognition and memory are normal.    Review of Systems  Constitutional: Negative.   HENT: Negative.   Eyes: Negative.   Respiratory: Negative.   Cardiovascular: Negative.   Gastrointestinal: Negative.   Genitourinary: Negative.   Musculoskeletal: Negative.   Skin: Negative.   Neurological: Negative.   Endo/Heme/Allergies: Negative.   Psychiatric/Behavioral: Negative.     Blood pressure 106/62, pulse 69, temperature 98.4 F (36.9 C), temperature source Oral, resp. rate 18, height 5\' 4"  (1.626 m), weight 88.451 kg (195 lb), last menstrual period 05/26/2014.Body mass index is 33.46 kg/(m^2).  General Appearance: Fairly Groomed  05/28/2014::  Fair  Speech:  Clear and Coherent  Volume:  Normal  Mood:  Euthymic  Affect:  Appropriate  Thought Process:  Goal Directed  Orientation:  Full (Time, Place, and Person)  Thought Content:  Negative  Suicidal Thoughts:  No  Homicidal Thoughts:  No  Memory:  Immediate;   Fair Recent;   Fair Remote;   Fair  Judgement:  Fair  Insight:  Fair  Psychomotor Activity:  Negative  Concentration:  Fair  Recall:  [id number] of Knowledge:Fair  Language: Good  Akathisia:  No  Handed:  Right  AIMS (if indicated):     Assets:  Communication Skills Desire for Improvement Physical Health  Sleep:  Number of Hours: 4.75    Musculoskeletal: Strength & Muscle Tone: within normal limits Gait & Station: normal Patient leans: N/A   Mental Status Per Nursing Assessment::   On Admission:  NA  Current Mental Status by Physician: patient denies suicidal ideation, intent or plan  Loss Factors: Financial problems/change in socioeconomic status  Historical  Factors: poor impulse control  Risk Reduction Factors:   Sense of responsibility to family, Living with another person, especially a relative and Positive social support  Continued Clinical Symptoms:  Resolving anxiety, obsessive thoughts/mood symptoms  Cognitive Features That Contribute To Risk:  Closed-mindedness Polarized thinking    Suicide Risk:  Minimal: No identifiable suicidal ideation.  Patients presenting with no risk factors but with morbid ruminations; may be classified as minimal risk based on the severity of the depressive symptoms  Discharge Diagnoses:   AXIS I:  Bipolar I disorder, most recent episode (or current) mixed, unspecified              Obsessive compulsive disorder AXIS II:  Cluster B Traits AXIS III:   Past Medical History  Diagnosis Date  . None reported    AXIS IV:  other psychosocial or environmental problems and problems related to social environment AXIS V:  61-70 mild symptoms  Plan Of Care/Follow-up recommendations:  Activity:  as tolerated Diet:  healthy Tests:  Carbamazepine level: 4.7 Other:  patient to keep her after care appointment  Is patient on multiple antipsychotic therapies at discharge:  No   Has Patient had three or more failed trials of antipsychotic monotherapy by history:  No  Recommended Plan for Multiple Antipsychotic Therapies: NA    [id number], MD 06/05/2014, 9:32 AM

## 2014-06-05 NOTE — Progress Notes (Signed)
Writer reviewed pt discharge instructions with pt including medications, follow up care and crisis intervention. Pt acknowledged understanding of instructions and states that she has no reservations about leaving [facility] at this time. Pt denies SI/HI and AVH. Pt mood and affect are appropriate to the situation. Writer returned pt belongings from locker, and the pt is released into her own care.

## 2014-06-05 NOTE — Discharge Summary (Signed)
Physician Discharge Summary Note  Patient:  Natalie [name] is an 23 y.o., female MRN:  [id number] DOB:  1991/08/21 Patient phone:  [phone] (home)  Patient address:   [address] [place] [address],  Total Time spent with patient: 20 minutes  Date of Admission:  05/29/2014 Date of Discharge: 06/05/14  Reason for Admission:  Psychosis   Discharge Diagnoses: Principal Problem:   Bipolar I disorder, most recent episode (or current) mixed with psychosis Active Problems:   OCD (obsessive compulsive disorder)   Bipolar 1 disorder, manic, moderate  Psychiatric Specialty Exam: Physical Exam  Review of Systems  Constitutional: Negative.   HENT: Negative.   Eyes: Negative.   Respiratory: Negative.   Cardiovascular: Negative.   Gastrointestinal: Negative.   Genitourinary: Negative.   Musculoskeletal: Negative.   Skin: Negative.   Neurological: Negative.   Endo/Heme/Allergies: Negative.   Psychiatric/Behavioral: Negative.     Blood pressure 106/62, pulse 69, temperature 98.4 F (36.9 C), temperature source Oral, resp. rate 18, height 5\' 4"  (1.626 m), weight 88.451 kg (195 lb), last menstrual period 05/26/2014.Body mass index is 33.46 kg/(m^2).  See Physician SRA                                                  Past Psychiatric History: See H&P Diagnosis:  Hospitalizations:  Outpatient Care:  Substance Abuse Care:  Self-Mutilation:  Suicidal Attempts:  Violent Behaviors:   Musculoskeletal: Strength & Muscle Tone: within normal limits Gait & Station: normal Patient leans: N/A  DSM5:  Axis Diagnosis:   AXIS I: Bipolar I disorder, most recent episode (or current) mixed, unspecified  Obsessive compulsive disorder  AXIS II: Cluster B Traits  AXIS III:  Past Medical History   Diagnosis  Date   .  None reported    AXIS IV: other psychosocial or environmental problems and problems related to social environment  AXIS V: 61-70 mild  symptoms  Level of Care:  OP  Hospital Course:  Natalie [name] is a 23 y.o. female patient who was brought to the [facility] by family who were concerned about recent behaviors. The patient moved home to live with parents a month ago. Her mother reports the patient has been acting catatonic and talking to people who are not present. Patient states today during her psychiatric assessment "I feel agitated and depressed. There is stress in my life. I am homeless now. I deal with paranoia. I'm obsessed with time. I am always looking at watches. I am very anxious." Her mother in the ED also reporting that patient was previously living with husband's parents but they could not handle her psychiatric symptoms. At home the patient was standing in the bathroom doing nothing for many hours. In the ED the patient would often not answer questions posed by staff. She is also documented to have walked around the hallways ignoring directives to return to her room. Patient would only return when security was called. The patient was admitted to the 400 hallway for acute psychosis.          Natalie [name] was admitted to the adult unit where she was evaluated and her symptoms were identified. Medication management was discussed and implemented. Her medications were adjusted to address her symptoms. Patient's Lamictal was increased to 200 mg for improved mood control. The patient was started on Abilify 10  mg bid for psychotic symptoms. Her Trazodone 200 mg was continued for insomnia. She was encouraged to participate in unit programming. Medical problems were identified and treated appropriately. Home medication was restarted as needed.  She was evaluated each day by a clinical provider to ascertain the patient's response to treatment.  Improvement was noted by the patient's report of decreasing symptoms, improved sleep and appetite, affect, medication tolerance, behavior, and participation in unit programming.  The patient was  asked each day to complete a self inventory noting mood, mental status, pain, new symptoms, anxiety and concerns.         She responded well to medication and being in a therapeutic and supportive environment. At first the patient continued to exhibit bizarre behaviors such as standing in the bathroom for prolonged periods of time. The patient was made a Do Not Admit for a while as her roommate could not access the bathroom. There were also occasions where the patient would leave the unit and refuse to return. Natalie [name]. The nursing staff noted that she appeared less paranoid and irritable. Positive and appropriate behavior was noted and the patient was motivated for recovery.  She worked closely with the treatment team and case manager to develop a discharge plan with appropriate goals. Coping skills, problem solving as well as relaxation therapies were also part of the unit programming.         By the day of discharge she was in much improved condition than upon admission.  Symptoms were reported as significantly decreased or resolved completely.  The patient denied SI/HI and voiced no AVH. She was motivated to continue taking medication with a goal of continued improvement in mental health.  Natalie [name] was discharged home with a plan to follow up as noted below.  Consults:  None  Significant Diagnostic Studies:  UDS negative, CBC, Chemistry panel  Discharge Vitals:   Blood pressure 106/62, pulse 69, temperature 98.4 F (36.9 C), temperature source Oral, resp. rate 18, height 5\' 4"  (1.626 m), weight 88.451 kg (195 lb), last menstrual period 05/26/2014. Body mass index is 33.46 kg/(m^2). Lab Results:   No results found for this or any previous visit (from the past 72 hour(s)).  Physical Findings: AIMS: Facial and Oral Movements Muscles of Facial Expression: None, normal Lips and Perioral Area: None, normal Jaw: None, normal Tongue: None, normal,Extremity  Movements Upper (arms, wrists, hands, fingers): None, normal Lower (legs, knees, ankles, toes): None, normal, Trunk Movements Neck, shoulders, hips: None, normal, Overall Severity Severity of abnormal movements (highest score from questions above): None, normal Incapacitation due to abnormal movements: None, normal Patient's awareness of abnormal movements (rate only patient's report): No Awareness, Dental Status Current problems with teeth and/or dentures?: No Does patient usually wear dentures?: No  CIWA:    COWS:     Psychiatric Specialty Exam: See Psychiatric Specialty Exam and Suicide Risk Assessment completed by Attending Physician prior to discharge.  Discharge destination:  Home  Is patient on multiple antipsychotic therapies at discharge:  No   Has Patient had three or more failed trials of antipsychotic monotherapy by history:  No  Recommended Plan for Multiple Antipsychotic Therapies: NA     Medication List    STOP taking these medications       EPITOL 200 MG tablet  Generic drug:  carbamazepine     hydrOXYzine 25 MG tablet  Commonly known as:  ATARAX/VISTARIL      TAKE these medications  Indication   amantadine 100 MG capsule  Commonly known as:  SYMMETREL  Take 1 capsule (100 mg total) by mouth 2 (two) times daily.   Indication:  Extrapyramidal Reaction caused by Medications     ARIPiprazole 10 MG tablet  Commonly known as:  ABILIFY  Take 1 tablet (10 mg total) by mouth 2 (two) times daily after a meal.   Indication:  Rapidly Alternating Manic-Depressive Psychosis     lamoTRIgine 200 MG tablet  Commonly known as:  LAMICTAL  Take 1 tablet (200 mg total) by mouth daily.   Indication:  Manic-Depression     traZODone 100 MG tablet  Commonly known as:  DESYREL  Take 2 tablets (200 mg total) by mouth at bedtime.   Indication:  Trouble Sleeping       Follow-up Information   Follow up with [facility]. (Go to the walk-in clinic M-F between 8 and9 AM  for your hospital follow up appointment)    Contact information:   [address]  [place] [phone]     Follow-up recommendations:   Activity: as tolerated  Diet: healthy  Tests: Carbamazepine level: 4.7  Other: patient to keep her after care appointment   Comments:  Take all your medications as prescribed by your mental healthcare provider.  Report any adverse effects and or reactions from your medicines to your outpatient provider promptly.  Patient is instructed and cautioned to not engage in alcohol and or illegal drug use while on prescription medicines.  In the event of worsening symptoms, patient is instructed to call the crisis hotline, 911 and or go to the nearest ED for appropriate evaluation and treatment of symptoms.  Follow-up with your primary care provider for your other medical issues, concerns and or health care needs.   Total Discharge Time:  Greater than 30 minutes.  SignedFransisca Kaufmann NP-C 06/05/2014, 3:29 PM  Patient seen, evaluated and I agree with notes by Nurse Practitioner. Thedore Mins, MD

## 2014-06-07 NOTE — Progress Notes (Signed)
Patient Discharge Instructions:  After Visit Summary (AVS):   Faxed to:  06/07/14 Discharge Summary Note:   Faxed to:  06/07/14 Psychiatric Admission Assessment Note:   Faxed to:  06/07/14 Suicide Risk Assessment - Discharge Assessment:   Faxed to:  06/07/14 Faxed/Sent to the Next Level Care provider:  06/07/14 Faxed to [facility] @ [phone] Jerelene Redden, 06/07/2014, 3:04 PM

## 2014-06-12 ENCOUNTER — Emergency Department (HOSPITAL_COMMUNITY)
Admission: EM | Admit: 2014-06-12 | Discharge: 2014-06-13 | Disposition: A | Discharge status: Home / Self Care | Payer: Medicaid Other | Attending: Emergency Medicine | Admitting: Emergency Medicine

## 2014-06-12 ENCOUNTER — Encounter (HOSPITAL_COMMUNITY): Payer: Self-pay | Admitting: Emergency Medicine

## 2014-06-12 DIAGNOSIS — Z3202 Encounter for pregnancy test, result negative: Secondary | ICD-10-CM | POA: Diagnosis not present

## 2014-06-12 DIAGNOSIS — F411 Generalized anxiety disorder: Secondary | ICD-10-CM | POA: Insufficient documentation

## 2014-06-12 DIAGNOSIS — F312 Bipolar disorder, current episode manic severe with psychotic features: Secondary | ICD-10-CM | POA: Diagnosis not present

## 2014-06-12 DIAGNOSIS — F429 Obsessive-compulsive disorder, unspecified: Secondary | ICD-10-CM | POA: Insufficient documentation

## 2014-06-12 DIAGNOSIS — F22 Delusional disorders: Secondary | ICD-10-CM | POA: Insufficient documentation

## 2014-06-12 DIAGNOSIS — Z79899 Other long term (current) drug therapy: Secondary | ICD-10-CM | POA: Diagnosis not present

## 2014-06-12 DIAGNOSIS — Z008 Encounter for other general examination: Secondary | ICD-10-CM | POA: Diagnosis present

## 2014-06-12 LAB — COMPREHENSIVE METABOLIC PANEL
ALBUMIN: 4.5 g/dL (ref 3.5–5.2)
ALK PHOS: 105 U/L (ref 39–117)
ALT: 32 U/L (ref 0–35)
ANION GAP: 17 — AB (ref 5–15)
AST: 24 U/L (ref 0–37)
BUN: 6 mg/dL (ref 6–23)
CHLORIDE: 101 meq/L (ref 96–112)
CO2: 24 mEq/L (ref 19–32)
Calcium: 10 mg/dL (ref 8.4–10.5)
Creatinine, Ser: 0.66 mg/dL (ref 0.50–1.10)
GFR calc non Af Amer: >90 mL/min (ref >90)
GLUCOSE: 99 mg/dL (ref 70–99)
POTASSIUM: 3.8 meq/L (ref 3.7–5.3)
Sodium: 142 mEq/L (ref 137–147)
Total Bilirubin: 0.3 mg/dL (ref 0.3–1.2)
Total Protein: 8.4 g/dL — ABNORMAL HIGH (ref 6.0–8.3)

## 2014-06-12 LAB — RAPID URINE DRUG SCREEN, HOSP PERFORMED
AMPHETAMINES: NOT DETECTED
BARBITURATES: NOT DETECTED
Benzodiazepines: NOT DETECTED
Cocaine: NOT DETECTED
Opiates: NOT DETECTED
Tetrahydrocannabinol: NOT DETECTED

## 2014-06-12 LAB — CBC
HEMATOCRIT: 39.8 % (ref 36.0–46.0)
HEMOGLOBIN: 13.2 g/dL (ref 12.0–15.0)
MCH: 30.8 pg (ref 26.0–34.0)
MCHC: 33.2 g/dL (ref 30.0–36.0)
MCV: 92.8 fL (ref 78.0–100.0)
Platelets: 229 10*3/uL (ref 150–400)
RBC: 4.29 MIL/uL (ref 3.87–5.11)
RDW: 14.8 % (ref 11.5–15.5)
WBC: 11.3 10*3/uL — AB (ref 4.0–10.5)

## 2014-06-12 LAB — POC URINE PREG, ED: PREG TEST UR: NEGATIVE

## 2014-06-12 LAB — ETHANOL: Alcohol, Ethyl (B): <11 mg/dL (ref 0–11)

## 2014-06-12 LAB — ACETAMINOPHEN LEVEL

## 2014-06-12 LAB — SALICYLATE LEVEL: Salicylate Lvl: <2 mg/dL — ABNORMAL LOW (ref 2.8–20.0)

## 2014-06-12 NOTE — ED Notes (Addendum)
Recently left [facility] on 7/6 for Bipolar with psychotic features, they did a medication adjustment putting her on trazodone 200mg , lamictal, amantadine 100mg  and Aripiprazole 10 mg. SInce she has left BH she reports inability to sleep and "procrastination" denies SI and HI. Pt is withdrawn, flat affect and very slow to respond. PT reports she is unable to have a BM-family is concerned because she stays in the bathroom for hours at a time and has moments where "she is almost catatonic"  Last BM is unknown.  Family states, "the only thing that has changed since she left [facility] is her rage, now she is almost sedated"

## 2014-06-13 ENCOUNTER — Inpatient Hospital Stay (HOSPITAL_COMMUNITY)
Admission: AD | Admit: 2014-06-13 | Discharge: 2014-06-25 | DRG: 885 | Disposition: A | Discharge status: Home / Self Care | Payer: Medicaid Other | Source: Intra-hospital | Attending: Psychiatry | Admitting: Psychiatry

## 2014-06-13 ENCOUNTER — Encounter (HOSPITAL_COMMUNITY): Payer: Self-pay

## 2014-06-13 DIAGNOSIS — F3160 Bipolar disorder, current episode mixed, unspecified: Secondary | ICD-10-CM | POA: Diagnosis present

## 2014-06-13 DIAGNOSIS — Z5989 Other problems related to housing and economic circumstances: Secondary | ICD-10-CM | POA: Diagnosis not present

## 2014-06-13 DIAGNOSIS — Z598 Other problems related to housing and economic circumstances: Secondary | ICD-10-CM

## 2014-06-13 DIAGNOSIS — F913 Oppositional defiant disorder: Secondary | ICD-10-CM | POA: Diagnosis present

## 2014-06-13 DIAGNOSIS — F312 Bipolar disorder, current episode manic severe with psychotic features: Secondary | ICD-10-CM | POA: Diagnosis not present

## 2014-06-13 DIAGNOSIS — Z5987 Material hardship due to limited financial resources, not elsewhere classified: Secondary | ICD-10-CM

## 2014-06-13 DIAGNOSIS — F3110 Bipolar disorder, current episode manic without psychotic features, unspecified: Secondary | ICD-10-CM

## 2014-06-13 DIAGNOSIS — F3164 Bipolar disorder, current episode mixed, severe, with psychotic features: Principal | ICD-10-CM | POA: Diagnosis present

## 2014-06-13 DIAGNOSIS — F3112 Bipolar disorder, current episode manic without psychotic features, moderate: Secondary | ICD-10-CM | POA: Diagnosis present

## 2014-06-13 DIAGNOSIS — F411 Generalized anxiety disorder: Secondary | ICD-10-CM | POA: Diagnosis present

## 2014-06-13 DIAGNOSIS — F429 Obsessive-compulsive disorder, unspecified: Secondary | ICD-10-CM | POA: Diagnosis present

## 2014-06-13 DIAGNOSIS — G4700 Insomnia, unspecified: Secondary | ICD-10-CM | POA: Diagnosis present

## 2014-06-13 HISTORY — DX: Depression, unspecified: F32.A

## 2014-06-13 HISTORY — DX: Major depressive disorder, single episode, unspecified: F32.9

## 2014-06-13 MED ORDER — TRAZODONE HCL 100 MG PO TABS
200.0000 mg | ORAL_TABLET | Freq: Every day | ORAL | Status: DC
  Administered 2014-06-13: 200 mg via ORAL
  Filled 2014-06-13 (×3): qty 2

## 2014-06-13 MED ORDER — TRAZODONE HCL 50 MG PO TABS: 200.0000 mg | ORAL_TABLET | Freq: Every day | ORAL | Status: DC

## 2014-06-13 MED ORDER — DOCUSATE CALCIUM 240 MG PO CAPS
240.0000 mg | ORAL_CAPSULE | Freq: Every day | ORAL | Status: DC
  Filled 2014-06-13 (×2): qty 1

## 2014-06-13 MED ORDER — ARIPIPRAZOLE 10 MG PO TABS
10.0000 mg | ORAL_TABLET | Freq: Two times a day (BID) | ORAL | Status: DC
  Administered 2014-06-14 – 2014-06-20 (×9): 10 mg via ORAL
  Filled 2014-06-13 (×18): qty 1

## 2014-06-13 MED ORDER — AMANTADINE HCL 100 MG PO CAPS
100.0000 mg | ORAL_CAPSULE | Freq: Two times a day (BID) | ORAL | Status: DC
  Administered 2014-06-13: 100 mg via ORAL
  Filled 2014-06-13 (×3): qty 1

## 2014-06-13 MED ORDER — HYDROXYZINE HCL 25 MG PO TABS
25.0000 mg | ORAL_TABLET | Freq: Four times a day (QID) | ORAL | Status: DC | PRN
  Administered 2014-06-14: 25 mg via ORAL
  Filled 2014-06-13 (×2): qty 1

## 2014-06-13 MED ORDER — MAGNESIUM HYDROXIDE 400 MG/5ML PO SUSP
30.0000 mL | Freq: Every day | ORAL | Status: DC | PRN
  Administered 2014-06-14: 30 mL via ORAL

## 2014-06-13 MED ORDER — ALUM & MAG HYDROXIDE-SIMETH 200-200-20 MG/5ML PO SUSP: 30.0000 mL | ORAL | Status: DC | PRN

## 2014-06-13 MED ORDER — DOCUSATE SODIUM 100 MG PO CAPS
100.0000 mg | ORAL_CAPSULE | Freq: Two times a day (BID) | ORAL | Status: DC
Start: 2014-06-13 — End: 2014-06-13
  Administered 2014-06-13: 100 mg via ORAL
  Filled 2014-06-13 (×3): qty 1

## 2014-06-13 MED ORDER — LAMOTRIGINE 200 MG PO TABS
200.0000 mg | ORAL_TABLET | Freq: Every day | ORAL | Status: DC
  Administered 2014-06-14 – 2014-06-20 (×6): 200 mg via ORAL
  Filled 2014-06-13 (×8): qty 1
  Filled 2014-06-13: qty 2

## 2014-06-13 MED ORDER — AMANTADINE HCL 100 MG PO CAPS
100.0000 mg | ORAL_CAPSULE | Freq: Two times a day (BID) | ORAL | Status: DC
  Administered 2014-06-13 – 2014-06-20 (×12): 100 mg via ORAL
  Filled 2014-06-13 (×18): qty 1

## 2014-06-13 MED ORDER — ARIPIPRAZOLE 10 MG PO TABS
10.0000 mg | ORAL_TABLET | Freq: Two times a day (BID) | ORAL | Status: DC
  Administered 2014-06-13: 10 mg via ORAL
  Filled 2014-06-13 (×3): qty 1

## 2014-06-13 MED ORDER — ACETAMINOPHEN 325 MG PO TABS: 650.0000 mg | ORAL_TABLET | Freq: Four times a day (QID) | ORAL | Status: DC | PRN

## 2014-06-13 MED ORDER — LAMOTRIGINE 200 MG PO TABS
200.0000 mg | ORAL_TABLET | Freq: Every day | ORAL | Status: DC
  Administered 2014-06-13: 200 mg via ORAL
  Filled 2014-06-13 (×2): qty 1

## 2014-06-13 NOTE — ED Notes (Signed)
Pt requesting to leave. Pt asked to stay to have tele-psych performed. Pt agreed and went back into room.

## 2014-06-13 NOTE — Consult Note (Signed)
[facility]iatry Consult   Reason for Consult:  Referral for psychiatric evaluation Referring Physician:  EDP ANIEYA HELMAN is an 23 y.o. female.  Assessment: AXIS I:  Bipolar, Manic AXIS II:  Deferred AXIS III:   Past Medical History  Diagnosis Date  . Psychiatric diagnosis   . Chronic bipolar disorder   . OCD (obsessive compulsive disorder)   . H/O suicide attempt 05-2013    OD  . Anxiety    AXIS IV:  housing problems, occupational problems, other psychosocial or environmental problems and problems with primary support group AXIS V:  21-30 behavior considerably influenced by delusions or hallucinations OR serious impairment in judgment, communication OR inability to function in almost all areas  Plan:  Send to [facility] for monitoring, stabilization, and evaluation as to whether pt meets inpatient criteria.   Subjective:   SABRINE PATCHEN is a 23 y.o. female patient who presented to [facility] voluntarily secondary to bizarre behavior. Pt was recently discharged from [facility] on 06/04/14. Pt mentions procrastinating but cannot give any accurate examples that meet this definition. From pt reports, the behaviors appear to be impulsive rather than procrastination. Pt may have these definitions reversed. Pt has presented to ED staff as being confused and disoriented. Pt does deny SI, HI, and AVH, contracts for safety. However, pt appears to be manic and very unstable. Pt is agreeable to come to [facility] UN[facility] to manage medications, which she states "are causing horrible side effects, memory loss, and confusion".    HPI:  LANAI CONLEE is an 23 y.o. female who presents voluntarily to [facility] due to continued bizarre behaviors. Pt was d/c from [facility] on 06/04/14. Pt reports "I'm having problems sleeping and I'm procrastinating a lot." Pt also reports difficulty with memory. Pt appeared to having difficultly with concentration at start of assessment due to thinking she was in the  wrong room at ED. RN continued to redirect pt and explain that she was remembering the room from her previous ED visit. Per RN, pt continued to think that she was supposed to be in another room. Pt stood closely at the door with RN staff during entire assessment. Pt reported nervous mood with flat affect. Pt is disheveled with thought blocking and slow speech and flat affect. Pt reported she is in better state that previous admission. Pt reported she has been taking Trazodone at the incorrect time. Pt reported she was directed by her parents about time she was to take it. Pt currently denies SI, HI, AVH and substance abuse. Pt discussed her father's temper as an issue. Pt stated she does not want to live at home with parents anymore. Pt reported its due to father's temper and being in fear of aggressive dog in the home.   HPI Elements:   Location:  Suicide Attempt/Ideation. Quality:  Overdose. Severity:  Severe depression. Timing:  Frequent . Duration:  Gradually over the past year with increase over past 3 days. Context:  Living situation and after argument with her father.  Past Psychiatric History: Past Medical History  Diagnosis Date  . Psychiatric diagnosis   . Chronic bipolar disorder   . OCD (obsessive compulsive disorder)   . H/O suicide attempt 05-2013    OD  . Anxiety     reports that she has never smoked. She does not have any smokeless tobacco history on file. She reports that she does not drink alcohol or use illicit drugs. History reviewed. No pertinent family  history. Family History Substance Abuse: No Family Supports: Yes, List: (parents) Living Arrangements: Parent Can pt return to current living arrangement?: Yes Abuse/Neglect [facility]) Physical Abuse: Denies Verbal Abuse: Denies Sexual Abuse: Denies Allergies:  No Known Allergies  ACT Assessment Complete:  No:   Past Psychiatric History: Yes Diagnosis:  Bipolar Depressed; Suicide Attempt  Hospitalizations:  2007 &  2008 [facility]  Outpatient Care:  [facility]  Substance Abuse Care:  None  Self-Mutilation:  None  Suicidal Attempts:  Two   Homicidal Behaviors:  None   Violent Behaviors:  None   Place of Residence:  Lives with husband, mother-in-law and husband's aunt Marital Status:  Married Employed/Unemployed:  Unemployed EducationField seismologist Family Supports:  Husband Objective: Blood pressure 118/76, pulse 74, temperature 98.9 F (37.2 C), temperature source Oral, resp. rate 18, height 5' 4"  (1.626 m), weight 84.369 kg (186 lb), last menstrual period 05/26/2014, SpO2 100.00%.Body mass index is 31.91 kg/(m^2). Results for orders placed during the hospital encounter of 06/12/14 (from the past 72 hour(s))  ACETAMINOPHEN LEVEL     Status: None   Collection Time    06/12/14  9:30 PM      Result Value Ref Range   Acetaminophen (Tylenol), Serum <15.0  10 - 30 ug/mL   Comment:            THERAPEUTIC CONCENTRATIONS VARY     SIGNIFICANTLY. A RANGE OF 10-30     ug/mL MAY BE AN EFFECTIVE     CONCENTRATION FOR MANY PATIENTS.     HOWEVER, SOME ARE BEST TREATED     AT CONCENTRATIONS OUTSIDE THIS     RANGE.     ACETAMINOPHEN CONCENTRATIONS     >150 ug/mL AT 4 HOURS AFTER     INGESTION AND >50 ug/mL AT 12     HOURS AFTER INGESTION ARE     OFTEN ASSOCIATED WITH TOXIC     REACTIONS.  CBC     Status: Abnormal   Collection Time    06/12/14  9:30 PM      Result Value Ref Range   WBC 11.3 (*) 4.0 - 10.5 K/uL   RBC 4.29  3.87 - 5.11 MIL/uL   Hemoglobin 13.2  12.0 - 15.0 g/dL   HCT 39.8  36.0 - 46.0 %   MCV 92.8  78.0 - 100.0 fL   MCH 30.8  26.0 - 34.0 pg   MCHC 33.2  30.0 - 36.0 g/dL   RDW 14.8  11.5 - 15.5 %   Platelets 229  150 - 400 K/uL  COMPREHENSIVE METABOLIC PANEL     Status: Abnormal   Collection Time    06/12/14  9:30 PM      Result Value Ref Range   Sodium 142  137 - 147 mEq/L   Potassium 3.8  3.7 - 5.3 mEq/L   Chloride 101  96 - 112 mEq/L   CO2 24  19 - 32 mEq/L   Glucose, Bld 99   70 - 99 mg/dL   BUN 6  6 - 23 mg/dL   Creatinine, Ser 0.66  0.50 - 1.10 mg/dL   Calcium 10.0  8.4 - 10.5 mg/dL   Total Protein 8.4 (*) 6.0 - 8.3 g/dL   Albumin 4.5  3.5 - 5.2 g/dL   AST 24  0 - 37 U/L   ALT 32  0 - 35 U/L   Alkaline Phosphatase 105  39 - 117 U/L   Total Bilirubin 0.3  0.3 - 1.2  mg/dL   GFR calc non Af Amer >90  >90 mL/min   GFR calc Af Amer >90  >90 mL/min   Comment: (NOTE)     The eGFR has been calculated using the CKD EPI equation.     This calculation has not been validated in all clinical situations.     eGFR's persistently <90 mL/min signify possible Chronic Kidney     Disease.   Anion gap 17 (*) 5 - 15  ETHANOL     Status: None   Collection Time    06/12/14  9:30 PM      Result Value Ref Range   Alcohol, Ethyl (B) <11  0 - 11 mg/dL   Comment:            LOWEST DETECTABLE LIMIT FOR     SERUM ALCOHOL IS 11 mg/dL     FOR MEDICAL PURPOSES ONLY  SALICYLATE LEVEL     Status: Abnormal   Collection Time    06/12/14  9:30 PM      Result Value Ref Range   Salicylate Lvl <8.9 (*) 2.8 - 20.0 mg/dL  URINE RAPID DRUG SCREEN (HOSP PERFORMED)     Status: None   Collection Time    06/12/14  9:48 PM      Result Value Ref Range   Opiates NONE DETECTED  NONE DETECTED   Cocaine NONE DETECTED  NONE DETECTED   Benzodiazepines NONE DETECTED  NONE DETECTED   Amphetamines NONE DETECTED  NONE DETECTED   Tetrahydrocannabinol NONE DETECTED  NONE DETECTED   Barbiturates NONE DETECTED  NONE DETECTED   Comment:            DRUG SCREEN FOR MEDICAL PURPOSES     ONLY.  IF CONFIRMATION IS NEEDED     FOR ANY PURPOSE, NOTIFY LAB     WITHIN 5 DAYS.                LOWEST DETECTABLE LIMITS     FOR URINE DRUG SCREEN     Drug Class       Cutoff (ng/mL)     Amphetamine      1000     Barbiturate      200     Benzodiazepine   373     Tricyclics       428     Opiates          300     Cocaine          300     THC              50  POC URINE PREG, ED     Status: None   Collection Time     06/12/14 10:01 PM      Result Value Ref Range   Preg Test, Ur NEGATIVE  NEGATIVE   Comment:            THE SENSITIVITY OF THIS     METHODOLOGY IS >24 mIU/mL   Labs are reviewed and are pertinent for Lithium level 1.71 and WBC 16.0.  Current Facility-Administered Medications  Medication Dose Route Frequency Provider Last Rate Last Dose  . amantadine (SYMMETREL) capsule 100 mg  100 mg Oral BID Johnna Acosta, MD   100 mg at 06/13/14 7681  . ARIPiprazole (ABILIFY) tablet 10 mg  10 mg Oral BID PC Johnna Acosta, MD   10 mg at 06/13/14 1572  . docusate sodium (COLACE) capsule 100 mg  100 mg Oral BID Johnna Acosta, MD   100 mg at 06/13/14 0402  . lamoTRIgine (LAMICTAL) tablet 200 mg  200 mg Oral Daily Johnna Acosta, MD   200 mg at 06/13/14 2751  . traZODone (DESYREL) tablet 200 mg  200 mg Oral QHS Johnna Acosta, MD       Current Outpatient Prescriptions  Medication Sig Dispense Refill  . amantadine (SYMMETREL) 100 MG capsule Take 1 capsule (100 mg total) by mouth 2 (two) times daily.  60 capsule  0  . ARIPiprazole (ABILIFY) 10 MG tablet Take 1 tablet (10 mg total) by mouth 2 (two) times daily after a meal.  60 tablet  0  . Docusate Calcium (STOOL SOFTENER PO) Take 1 tablet by mouth daily as needed (constipation).      Marland Kitchen lamoTRIgine (LAMICTAL) 200 MG tablet Take 1 tablet (200 mg total) by mouth daily.  30 tablet  0  . traZODone (DESYREL) 100 MG tablet Take 2 tablets (200 mg total) by mouth at bedtime.  60 tablet  0    Psychiatric Specialty Exam:     Blood pressure 118/76, pulse 74, temperature 98.9 F (37.2 C), temperature source Oral, resp. rate 18, height 5' 4"  (1.626 m), weight 84.369 kg (186 lb), last menstrual period 05/26/2014, SpO2 100.00%.Body mass index is 31.91 kg/(m^2).  General Appearance: Fairly Groomed  Engineer, water::  Minimal  Speech:  Slow  Volume:  Normal  Mood:  Depressed  Affect:  Tearful  Thought Process:  Coherent  Orientation:  Other:  Intermittently to  self, place, situation.  Thought Content:  WDL  Suicidal Thoughts:  No  Homicidal Thoughts:  No  Memory:  Immediate;   Good Recent;   Good Remote;   Fair  Judgement:  Poor  Insight:  Fair  Psychomotor Activity:  Normal  Concentration:  Fair  Recall:  Fair  Akathisia:  No  Handed:  Right  AIMS (if indicated):     Assets:  Desire for Improvement  Sleep:      Treatment Plan Summary: Send to [facility] for monitoring, stabilization, and evaluation as to whether pt meets inpatient criteria.   Benjamine Mola, FNP-BC 06/13/2014 10:01 AM

## 2014-06-13 NOTE — Progress Notes (Signed)
Patient does not want to eat dinner tray. Patient exhibiting paranoid behavior.

## 2014-06-13 NOTE — ED Notes (Signed)
Pt states "This is not my room" Pt also c/o constipation. Pt states that she is lost and "did not come here for this." Pt getting more agitated and there is potential for aggression toward staff. EDP aware.

## 2014-06-13 NOTE — Progress Notes (Signed)
Mother called and wants to speak with the social worker. Writer has attempted to get patient to sign a release of information for communication with mom but patient stands and stares at paperwork. Recreational Therapist in to assess and work with patient. Patient wants to speak with the social worker. Social worker made aware and will be in to see patient.

## 2014-06-13 NOTE — Progress Notes (Signed)
NP in OBS to speak with another patient and was informed that this pt had arrived and needed orders.

## 2014-06-13 NOTE — Progress Notes (Signed)
Patient appeared extremely paranoid and has slow response to verbal instructions. Patient kept asking the same question over and over. Example she wipes her hand and will say, "I don't know which trash can to put this". She asks same questions over and over. Patient denied SI/HI. She denied A/V hallucinations but stated; " Sometimes I have hallucinations, but I'm very intrusive.   PA assessed patient and said she will be transferred to in-patient 400 hall.

## 2014-06-13 NOTE — ED Notes (Signed)
Pt asking if it is time to take her trazodone now. Pt informed it is too early for that and that medication is for bedtime.

## 2014-06-13 NOTE — Progress Notes (Signed)
Patient signed release of informationj to speak with mother. Natalie [name] made aware.
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
## 2014-06-13 NOTE — BH Assessment (Signed)
Clinician consulted with Donell Sievert, PA who recommends pt to be evaluated by psych in the AM. Clinician provided updates to Dr. Hyacinth Meeker.   Yaakov Guthrie, MSW, LCSW Triage Specialist [phone]

## 2014-06-13 NOTE — ED Notes (Signed)
[facility] contacted for transport 

## 2014-06-13 NOTE — ED Notes (Signed)
Pt. Requesting something to help her have a BM. She states the last time she had one was yesterday. Offered her prune juice she refused.

## 2014-06-13 NOTE — ED Notes (Signed)
Tele-pysch in process now.

## 2014-06-13 NOTE — Progress Notes (Addendum)
Patient admitted to the observation unit. Patient refused to sign obs unit paperwork. Extreme thought blocking present. Patient is attending to internal stimuli. She appears to stare blankly in to space and appear guarded, child like and fearful. Patient states that she wants to shower but when staff attempts to help her she just stands and stares. Patient provided with a lunch tray but makes no attempt to eat meal. Patient standing in the middle of the room stareing.

## 2014-06-13 NOTE — ED Notes (Addendum)
Pt. States that they put her on new meds at [facility].She reports being able to stay more focused. She is able to have a conversation. She reports that she is doing better than she was when she was last here. She does feel like she was sent home from [facility] to soon. She still feels like she is having problems with her rage at time, her ability to sleep and her issues with procrastination. She states she has had problems with having bowel movements and she has taken laxatives. She states she has stayed in the bathroom for long periods of time focusing on having a BM. She is obsessed with having BM stating that the meds she is taking decreases their frequency.

## 2014-06-13 NOTE — ED Notes (Signed)
TTS in progress 

## 2014-06-13 NOTE — ED Notes (Signed)
Pt. Standing in doorway looking up at room sign and bathroom sign and stating she is not supposed to be here that she is not in the right room. Attempted to redirect Pt. And explain to her that she has been in that room since she arrived to the unit. She continues to stand in doorway and look around.

## 2014-06-13 NOTE — ED Notes (Signed)
Pt refusing to go back into room. Security and Malachi Bonds, [place] helped pt back to room for breakfast. Pt refusing to eat breakfast. Pt continues to come out of room stating "this isn't my room".

## 2014-06-13 NOTE — Consult Note (Signed)
Case discussed, patient needs to be admitted to [facility]. Observation unit for monitoring

## 2014-06-13 NOTE — ED Notes (Signed)
Contacted Tina at [facility] about pt refusal to sign consent. Inetta Fermo stated that she will follow up with MD and call back for an update.

## 2014-06-13 NOTE — ED Notes (Signed)
Pt agreed to sign voluntary admission paperwork. RN witnessed signature.

## 2014-06-13 NOTE — Progress Notes (Signed)
VM left on Natalie [name]'s voice mail requesting admission orders.

## 2014-06-13 NOTE — ED Notes (Signed)
Pt. Came out in the hallway. Stated that she did not think the room she was in was her room. Explained to her that she was in a different room the last tome she was here but this was the room she was assigned to this time. Walked her down the the hall to room C21 and gave her the option to change to a room that was plain she decided that the other room was her room and went back to it and closed the door.

## 2014-06-13 NOTE — ED Notes (Signed)
When trying to get the patient to sign consent for [facility] obs unit, the patient became very tearful expressing fear of her father. Pt requested SW to see her before she signs. SW at bedside.

## 2014-06-13 NOTE — BH Assessment (Signed)
Clinician contacted Dr. Hyacinth Meeker to gather report prior to seeing pt. Pt recently d/c from [facility] on 7/6. Pt diagnosed with Bipolar with psychotic features. [name] family reporting pt is catatonic and having delayed responses. Pt appearing sedated per Dr report. Pt denying SI and HI. Assessment to be initiated.   Natalie [name], MSW, LCSW Triage Specialist [phone]

## 2014-06-13 NOTE — ED Notes (Signed)
[facility] called, pt is going to be admitted to [facility] for observation.

## 2014-06-13 NOTE — ED Provider Notes (Signed)
CSN: [id number]     Arrival date & time 06/12/14  2056 History   First MD Initiated Contact with Patient 06/13/14 0011     Chief Complaint  Patient presents with  . Psychiatric Evaluation     (Consider location/radiation/quality/duration/timing/severity/associated sxs/prior Treatment) HPI Comments: -year-old female history of bipolar disorder with psychotic features. She presents back to the hospital after one week of being discharged from behavioral health during which time she had multiple medication changes to treat range, psychosis and mania. The parents state that over the last week she has had ongoing racing thoughts, sleeping less than 30 minutes at a time, increased rage and paranoia. The symptoms are persistent, gradually worsening, not associated with suicidal thoughts or attempts though she does have 2 attempts in the past  The history is provided by the patient, a parent and medical records.    Past Medical History  Diagnosis Date  . Psychiatric diagnosis   . Chronic bipolar disorder   . OCD (obsessive compulsive disorder)   . H/O suicide attempt 05-2013    OD  . Anxiety    History reviewed. No pertinent past surgical history. History reviewed. No pertinent family history. History  Substance Use Topics  . Smoking status: Never Smoker   . Smokeless tobacco: Not on file  . Alcohol Use: No   OB History   Grav Para Term Preterm Abortions TAB SAB Ect Mult Living                 Review of Systems  All other systems reviewed and are negative.     Allergies  Review of patient's allergies indicates no known allergies.  Home Medications   Prior to Admission medications   Medication Sig Start Date End Date Taking? Authorizing Provider  amantadine (SYMMETREL) 100 MG capsule Take 1 capsule (100 mg total) by mouth 2 (two) times daily. 06/05/14  Yes Fransisca Kaufmann, NP  ARIPiprazole (ABILIFY) 10 MG tablet Take 1 tablet (10 mg total) by mouth 2 (two) times daily after a meal.  06/05/14  Yes Fransisca Kaufmann, NP  Docusate Calcium (STOOL SOFTENER PO) Take 1 tablet by mouth daily as needed (constipation).   Yes Historical Provider, MD  lamoTRIgine (LAMICTAL) 200 MG tablet Take 1 tablet (200 mg total) by mouth daily. 06/05/14  Yes Fransisca Kaufmann, NP  traZODone (DESYREL) 100 MG tablet Take 2 tablets (200 mg total) by mouth at bedtime. 06/05/14  Yes Fransisca Kaufmann, NP   BP 118/76  Pulse 74  Temp(Src) 98.9 F (37.2 C) (Oral)  Resp 18  Ht 5\' 4"  (1.626 m)  Wt 186 lb (84.369 kg)  BMI 31.91 kg/m2  SpO2 100%  LMP 05/26/2014 Physical Exam  Nursing note and vitals reviewed. Constitutional: She appears well-developed and well-nourished. No distress.  HENT:  Head: Normocephalic and atraumatic.  Mouth/Throat: Oropharynx is clear and moist. No oropharyngeal exudate.  Eyes: Conjunctivae and EOM are normal. Pupils are equal, round, and reactive to light. Right eye exhibits no discharge. Left eye exhibits no discharge. No scleral icterus.  Neck: Normal range of motion. Neck supple. No JVD present. No thyromegaly present.  Cardiovascular: Normal rate, regular rhythm, normal heart sounds and intact distal pulses.  Exam reveals no gallop and no friction rub.   No murmur heard. Pulmonary/Chest: Effort normal and breath sounds normal. No respiratory distress. She has no wheezes. She has no rales.  Abdominal: Soft. Bowel sounds are normal. She exhibits no distension and no mass. There is no tenderness.  Musculoskeletal: Normal  range of motion. She exhibits no edema and no tenderness.  Lymphadenopathy:    She has no cervical adenopathy.  Neurological: She is alert. Coordination normal.  Skin: Skin is warm and dry. No rash noted. No erythema.  Psychiatric:  Sedated appearing, paranoia present.    ED Course  Procedures (including critical care time) Labs Review Labs Reviewed  CBC - Abnormal; Notable for the following:    WBC 11.3 (*)    All other components within normal limits  COMPREHENSIVE  METABOLIC PANEL - Abnormal; Notable for the following:    Total Protein 8.4 (*)    Anion gap 17 (*)    All other components within normal limits  SALICYLATE LEVEL - Abnormal; Notable for the following:    Salicylate Lvl <2.0 (*)    All other components within normal limits  ACETAMINOPHEN LEVEL  ETHANOL  URINE RAPID DRUG SCREEN (HOSP PERFORMED)  POC URINE PREG, ED    Imaging Review No results found.   EKG Interpretation None      MDM   Final diagnoses:  None    At this time the patient appears hemodynamically stable, she does appear to have decompensated psychiatrically, she will need psychiatric duration.  D/w psyche eval team - requested psychiatrist evaluation, the pt has not decompensated with "rage" but has been persistently standing in doorway, pacing and having paranoia.    Change of shift - care signed out to oncoming physician.    Vida Roller, MD 06/13/14 [phone]

## 2014-06-13 NOTE — Progress Notes (Signed)
Per, Renata Caprice NP - patient has been accepted to the OBS Unit.  Writer informed the nurse Dorene Grebe and [place]).  Renata Caprice, NP informed the ER MD of the patients disposition.   The nurse will fax the support paperwork to the assessment office and arrange transportation through [place].

## 2014-06-13 NOTE — BH Assessment (Signed)
Tele Assessment Note   Natalie [name] is an 23 y.o. female who presents voluntarily to [facility] due to continued bizarre behaviors. Pt was d/c from [facility] on 06/04/14. Pt reports "I'm having problems sleeping and I'm procrastinating a lot." Pt also reports difficulty with memory. Pt appeared to having difficultly with concentration at start of assessment due to thinking she was in the wrong room at ED. RN continued to redirect pt and explain that she was remembering the room from her previous ED visit. Per RN, pt continued to think that she was supposed to be in another room. Pt stood closely at the door with RN staff during entire assessment. Pt reported nervous mood with flat affect. Pt is disheveled with thought blocking and slow speech and flat affect. Pt reported she is in better state that previous admission. Pt reported she has been taking Trazodone at the incorrect time. Pt reported she was directed by her parents about time she was to take it. Pt currently denies SI, HI, AVH and substance abuse. Pt discussed her father's temper as an issue. Pt stated she does not want to live at home with parents anymore. Pt reported its due to father's temper and being in fear of aggressive dog in the home.  Axis I: Bipolar I, Manic with Psychotic Features (by history) Axis II: Deferred Axis III:  Past Medical History  Diagnosis Date  . Psychiatric diagnosis   . Chronic bipolar disorder   . OCD (obsessive compulsive disorder)   . H/O suicide attempt 05-2013    OD  . Anxiety    Axis IV: housing problems, occupational problems, other psychosocial or environmental problems, problems related to social environment and problems with primary support group  Past Medical History:  Past Medical History  Diagnosis Date  . Psychiatric diagnosis   . Chronic bipolar disorder   . OCD (obsessive compulsive disorder)   . H/O suicide attempt 05-2013    OD  . Anxiety     History reviewed. No pertinent past  surgical history.  Family History: History reviewed. No pertinent family history.  Social History:  reports that she has never smoked. She does not have any smokeless tobacco history on file. She reports that she does not drink alcohol or use illicit drugs.  Additional Social History:  Alcohol / Drug Use Pain Medications: none reported Prescriptions: Abilify, Symmetrel, Lamictal, Desyrel Over the Counter: stool softener History of alcohol / drug use?: No history of alcohol / drug abuse  CIWA: CIWA-Ar BP: 118/76 mmHg Pulse Rate: 74 COWS:    Allergies: No Known Allergies  Home Medications:  (Not in a hospital admission)  OB/GYN Status:  Patient's last menstrual period was 05/26/2014.  General Assessment Data Location of Assessment: [facility] ED Is this a Tele or Face-to-Face Assessment?: Tele Assessment Is this an Initial Assessment or a Re-assessment for this encounter?: Initial Assessment Living Arrangements: Parent Can pt return to current living arrangement?: Yes Admission Status: Voluntary Is patient capable of signing voluntary admission?: Yes Transfer from: Acute Hospital Referral Source: Self/Family/Friend  Medical Screening Exam [facility] Walk-in ONLY) Medical Exam completed:  (NA)  [facility] Crisis Care Plan Living Arrangements: Parent Name of Psychiatrist: Alemu Mengistu Name of Therapist: Annia Friendly- [facility]     Risk to self Suicidal Ideation: No Suicidal Intent: No Is patient at risk for suicide?: No Suicidal Plan?: No Access to Means: No What has been your use of drugs/alcohol within the last 12 months?:  (none reported) Previous Attempts/Gestures: Yes How many  times?: 2 Other Self Harm Risks: none reported Triggers for Past Attempts: Hallucinations Intentional Self Injurious Behavior: None Family Suicide History: Unknown Recent stressful life event(s): Conflict (Comment) (Pt reported conflict with living with parents. ) Persecutory voices/beliefs?: Yes Depression:  Yes Depression Symptoms: Despondent;Insomnia;Fatigue Substance abuse history and/or treatment for substance abuse?: No Suicide prevention information given to non-admitted patients: Not applicable  Risk to Others Homicidal Ideation: No Thoughts of Harm to Others: No Current Homicidal Intent: No Current Homicidal Plan: No Access to Homicidal Means: No Identified Victim: NA History of harm to others?: No Assessment of Violence: None Noted Violent Behavior Description: Pt calm and cooperative at assessment.  Does patient have access to weapons?: No Criminal Charges Pending?: No Does patient have a court date: No  Psychosis Hallucinations: None noted Delusions: Persecutory  Mental Status Report Appear/Hygiene: Disheveled Eye Contact: Good Motor Activity: Psychomotor retardation Speech: Soft;Slow Level of Consciousness: Alert Mood: Other (Comment) ("nervous") Affect: Flat;Apprehensive Anxiety Level: Moderate Thought Processes: Coherent;Relevant Judgement: Unimpaired Orientation: Person;Place;Situation Obsessive Compulsive Thoughts/Behaviors: Moderate  Cognitive Functioning Concentration: Decreased Memory: Recent Intact;Remote Intact IQ: Average Insight: Fair Impulse Control: Fair Weight Loss:  (unk) Weight Gain:  (unk) Sleep: Decreased Total Hours of Sleep: 4 Vegetative Symptoms: None  ADLScreening [facility] Assessment Services) Patient's cognitive ability adequate to safely complete daily activities?: Yes Patient able to express need for assistance with ADLs?: Yes Independently performs ADLs?: Yes (appropriate for developmental age)  Prior Inpatient Therapy Prior Inpatient Therapy: Yes Prior Therapy Dates: 04/2014, 12/2012 Prior Therapy Facilty/Provider(s): [facility] Reason for Treatment: suicide attempt, hallucinations  Prior Outpatient Therapy Prior Outpatient Therapy: Yes Prior Therapy Dates: ongoing Prior Therapy Facilty/Provider(s): [facility] Reason for Treatment:  bipolar  ADL Screening (condition at time of admission) Patient's cognitive ability adequate to safely complete daily activities?: Yes Is the patient deaf or have difficulty hearing?: No Does the patient have difficulty seeing, even when wearing glasses/contacts?: No Does the patient have difficulty concentrating, remembering, or making decisions?: No Patient able to express need for assistance with ADLs?: Yes Does the patient have difficulty dressing or bathing?: No Independently performs ADLs?: Yes (appropriate for developmental age)       Abuse/Neglect Assessment (Assessment to be complete while patient is alone) Physical Abuse: Denies Verbal Abuse: Denies Sexual Abuse: Denies Exploitation of patient/patient's resources: Denies Self-Neglect: Denies Values / Beliefs Cultural Requests During Hospitalization: None Spiritual Requests During Hospitalization: None   Advance Directives (For Healthcare) Advance Directive: Patient does not have advance directive    Additional Information 1:1 In Past 12 Months?: No CIRT Risk: No Elopement Risk: No Does patient have medical clearance?: Yes     Disposition: Per Donell Sievert, PA pt is to be assessed by psych for further evaluation.  Disposition Initial Assessment Completed for this Encounter: Yes Disposition of Patient: Other dispositions Other disposition(s): Other (Comment) (Pt to be evaluated by psychiatry in the AM. )  Stewart,Reon Hunley R 06/13/2014 6:49 AM

## 2014-06-13 NOTE — ED Notes (Signed)
[facility] called to report they are now ready for tele-psych. The machine is set up in the room and pt is ready.

## 2014-06-13 NOTE — ED Notes (Signed)
[facility] at bedside to transport pt.

## 2014-06-14 ENCOUNTER — Encounter (HOSPITAL_COMMUNITY): Payer: Self-pay | Admitting: Psychiatry

## 2014-06-14 DIAGNOSIS — F3164 Bipolar disorder, current episode mixed, severe, with psychotic features: Principal | ICD-10-CM

## 2014-06-14 MED ORDER — DOCUSATE SODIUM 100 MG PO CAPS
100.0000 mg | ORAL_CAPSULE | Freq: Every day | ORAL | Status: DC
  Administered 2014-06-14 – 2014-06-25 (×11): 100 mg via ORAL
  Filled 2014-06-14 (×15): qty 1

## 2014-06-14 MED ORDER — DOXEPIN HCL 50 MG PO CAPS
50.0000 mg | ORAL_CAPSULE | Freq: Every day | ORAL | Status: DC
  Administered 2014-06-14: 50 mg via ORAL
  Filled 2014-06-14 (×2): qty 1

## 2014-06-14 NOTE — [facility] Group Notes (Signed)
[facility] Group Notes:  (Counselor/Nursing/MHT/Case Management/Adjunct)  06/14/2014 1:15PM  Type of Therapy:  Group Therapy  Participation Level:Did not attend  Summary of Progress/Problems: The topic for group was balance in life.  Pt participated in the discussion about when their life was in balance and out of balance and how this feels.  Pt discussed ways to get back in balance and short term goals they can work on to get where they want to be.    Natalie [name] 06/14/2014 3:03 PM
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
## 2014-06-14 NOTE — [facility] Suicide Risk Assessment (Signed)
   Nursing information obtained from:  Patient;Review of record Demographic factors:  Low socioeconomic status;Unemployed Current Mental Status:  NA Loss Factors:  Loss of significant relationship (husband lives out of state) Historical Factors:  Family history of mental illness or substance abuse Risk Reduction Factors:  Living with another person, especially a relative;Positive social support Total Time spent with patient: 30 minutes  CLINICAL FACTORS:   Severe Anxiety and/or Agitation Bipolar Disorder:   Mixed State Depression:   Anhedonia Delusional Hopelessness Impulsivity Insomnia Schizophrenia:   Paranoid or undifferentiated type Currently Psychotic  Psychiatric Specialty Exam: Physical Exam  Psychiatric: Her mood appears anxious. Her speech is delayed and tangential. She is slowed, withdrawn and actively hallucinating. Thought content is paranoid. Cognition and memory are normal. She expresses inappropriate judgment. She exhibits a depressed mood.    Review of Systems  Constitutional: Negative.   HENT: Negative.   Eyes: Negative.   Respiratory: Negative.   Cardiovascular: Negative.   Gastrointestinal: Negative.   Genitourinary: Negative.   Musculoskeletal: Negative.   Skin: Negative.   Neurological: Negative.   Endo/Heme/Allergies: Negative.   Psychiatric/Behavioral: Positive for depression and hallucinations. The patient is nervous/anxious.     Blood pressure 147/85, pulse 86, temperature 99.3 F (37.4 C), temperature source Oral, resp. rate 20, height 5\' 4"  (1.626 m), weight 84.369 kg (186 lb), last menstrual period 05/26/2014, SpO2 100.00%.Body mass index is 31.91 kg/(m^2).  General Appearance: Bizarre  Eye Contact::  Minimal  Speech:  Blocked and Slow  Volume:  Decreased  Mood:  Anxious and Dysphoric  Affect:  Constricted  Thought Process:  Circumstantial and Disorganized  Orientation:  Full (Time, Place, and Person)  Thought Content:  Hallucinations:  Auditory and Paranoid Ideation  Suicidal Thoughts:  No  Homicidal Thoughts:  No  Memory:  Immediate;   Fair Recent;   Fair Remote;   Fair  Judgement:  Impaired  Insight:  Lacking  Psychomotor Activity:  Decreased  Concentration:  Fair  Recall:  Poor  Fund of Knowledge:Fair  Language: Good  Akathisia:  No  Handed:  Right  AIMS (if indicated):     Assets:  Communication Skills Desire for Improvement Physical Health  Sleep:  Number of Hours: 0.75   Musculoskeletal: Strength & Muscle Tone: within normal limits Gait & Station: normal Patient leans: N/A  COGNITIVE FEATURES THAT CONTRIBUTE TO RISK:  Closed-mindedness Polarized thinking    SUICIDE RISK:   Minimal: No identifiable suicidal ideation.  Patients presenting with no risk factors but with morbid ruminations; may be classified as minimal risk based on the severity of the depressive symptoms  PLAN OF CARE:1. Admit for crisis management and stabilization. 2. Medication management to reduce current symptoms to base line and improve the  patient's overall level of functioning 3. Treat health problems as indicated. 4. Develop treatment plan to decrease risk of relapse upon discharge and the need for readmission. 5. Psycho-social education regarding relapse prevention and self care. 6. Health care follow up as needed for medical problems. 7. Restart home medications where appropriate.   I certify that inpatient services furnished can reasonably be expected to improve the patient's condition.  [phone], MD 06/14/2014, 10:24 AM

## 2014-06-14 NOTE — Progress Notes (Addendum)
D: Pt is blunted in affect and depressed in mood. Pt presents with thought blocking this evening. Pt was noted to be catatonic, but a decreased level than previously observed by this Clinical research associate (since admission). Pt continues to smile inappropriately during our interactions. Pt takes several minutes to take her medications. Pt reported that she was constipated. Pt given a dose of milk of mg.  A: Writer administered scheduled and prn medications to pt. Continued support and availability as needed was extended to this pt. Staff continue to monitor pt with q18min checks.  R: No adverse drug reactions noted. Pt receptive to treatment. Pt remains safe at this time.

## 2014-06-14 NOTE — Progress Notes (Signed)
Vol admit, AA 23 yo female, from the obs unit after being brought to the ED by her parents for continued bizarre behaviors.  Pt was just discharged from [facility] on 7/6 after an admission under similar circumstances.  [name] responses and actions seem to be delayed and pt displays confusion when asked a question or is asked to do something.  During the admission to the adult unit, pt was suspicious one minute, then smiling and agreeable the next.  When writer was reviewing the treatment agreement, pt wanted to pick and choose which one of the points she agreed with, but eventually she signed it after she circled two of the items "she had a question with".  Pt says her parents were not giving her medications correctly at home.  Pt received her hs meds in the obs unit before coming to the adult unit.  Pt was hesitant at first to come with staff to the unit, but then agreed to come.  Pt was given a snack and reoriented to the unit.  Safety checks q15 minutes continued.
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
## 2014-06-14 NOTE — Progress Notes (Signed)
D-pt just admitted yesterday, sts shes very unhappy living with her parents and wants [place] to help her find somewhere else to live, sts she happy with the medications she taking each day A-attended group but not active, took her am medications R-cont to monitor for safety

## 2014-06-14 NOTE — Progress Notes (Signed)
Patient transferred to the in patient. She received her HS medications before transfer. Continued to be paranoid and bizarre and has very slow response to verbal  commands.

## 2014-06-14 NOTE — Plan of Care (Signed)
[facility] Observation Crisis Plan  Reason for Crisis Plan:  Crisis Stabilization   Plan of Care:  Referral for Inpatient Hospitalization  Family Support:    Natalie [name] [phone])  Current Living Environment:  Living Arrangements: Parent  Insurance:  Self Pay Hospital Account   Name Acct ID Class Status Primary Coverage   Natalie [name], Natalie [name] [id number] [facility] Inpatient Special Open [facility]        Guarantor Account (for Hospital Account [id number])   Name Relation to Pt Service Area Active? Acct Type   Natalie [name] M Self [facility] Yes Behavioral Health   Address Phone       [address] [place], [place] [address] [phone](H)          Coverage Information (for Hospital Account [id number])   1. [facility]   F/O Payor/Plan Precert #   [facility]    Subscriber Subscriber #   Natalie [name], Natalie [name] [id number]   Address Phone   [address] [place], [place] [address] [phone]       2. [facility] [address]   F/O Payor/Plan Precert #   [facility] FOR [facility] [address]    Subscriber Subscriber #   Natalie [name], Natalie [name] [id number]   Address Phone   [address] [place], [place] [address] [phone]          Legal Guardian:   Unknown  Primary Care Provider:  No PCP Per Patient  Current Outpatient Providers:  Natalie [name]  Psychiatrist:   Vesta [name]  Counselor/Therapist:   Vesta [name]  Compliant with Medications:  Unknown  Additional Information: On the night of 06/13/2014 - 06/14/2014 pt further decompensated and was admitted to the Adult Unit.   Raphael Gibney 7/16/201510:08 AM

## 2014-06-14 NOTE — Tx Team (Signed)
  Interdisciplinary Treatment Plan Update   Date Reviewed:  06/14/2014  Time Reviewed:  11:20 AM  Progress in Treatment:   Attending groups: No Participating in groups: No Taking medication as prescribed: Yes  Tolerating medication: Yes Family/Significant other contact made: No  Patient understands diagnosis: No  Limited insight  Asking for help finding housing Discussing patient identified problems/goals with staff: Yes See initial care plan Medical problems stabilized or resolved: Yes Denies suicidal/homicidal ideation: Yes  In tx team Patient has not harmed self or others: Yes  For review of initial/current patient goals, please see plan of care.  Estimated Length of Stay:  4-5 days  Reason for Continuation of Hospitalization: Medication stabilization Other; describe confusion, disorganization, paranoia  New Problems/Goals identified:  N/A  Discharge Plan or Barriers:   return home, follow up outpt  Additional Comments: Vol admit, AA 23 yo female, from the obs unit after being brought to the ED by her parents for continued bizarre behaviors. Pt was just discharged from [facility] on 7/6 after an admission under similar circumstances. [name] responses and actions seem to be delayed and pt displays confusion when asked a question or is asked to do something. During the admission to the adult unit, pt was suspicious one minute, then smiling and agreeable the next. When writer was reviewing the treatment agreement, pt wanted to pick and choose which one of the points she agreed with, but eventually she signed it after she circled two of the items "she had a question with". Pt says her parents were not giving her medications correctly at home.    Attendees:  Signature: Thedore Mins, MD 06/14/2014 11:20 AM   Signature: Richelle Ito, LCSW 06/14/2014 11:20 AM  Signature: Fransisca Kaufmann, NP 06/14/2014 11:20 AM  Signature: Joslyn Devon, RN 06/14/2014 11:20 AM  Signature: Liborio Nixon, RN 06/14/2014  11:20 AM  Signature:  06/14/2014 11:20 AM  Signature:   06/14/2014 11:20 AM  Signature:    Signature:    Signature:    Signature:    Signature:    Signature:      Scribe for Treatment Team:   Richelle Ito, LCSW  06/14/2014 11:20 AM

## 2014-06-14 NOTE — Tx Team (Signed)
Initial Interdisciplinary Treatment Plan  PATIENT STRENGTHS: (choose at least two) General fund of knowledge Physical Health Supportive family/friends  PATIENT STRESSORS: Marital or family conflict Medication change or noncompliance Traumatic event   PROBLEM LIST: Problem List/Patient Goals Date to be addressed Date deferred Reason deferred Estimated date of resolution  Depression/confusion/nervous      Pt reports difficulty remembering things-suspicious of others      Pt does not want to return to her parent's home.                                           DISCHARGE CRITERIA:  Adequate post-discharge living arrangements Improved stabilization in mood, thinking, and/or behavior Motivation to continue treatment in a less acute level of care Safe-care adequate arrangements made Verbal commitment to aftercare and medication compliance  PRELIMINARY DISCHARGE PLAN: Attend aftercare/continuing care group Attend PHP/IOP Placement in alternative living arrangements  PATIENT/FAMIILY INVOLVEMENT: This treatment plan has been presented to and reviewed with the patient, Natalie [name], and/or family member.  The patient and family have been given the opportunity to ask questions and make suggestions.  Jesus Genera [facility] 06/14/2014, 12:49 AM

## 2014-06-15 MED ORDER — ENSURE COMPLETE PO LIQD
237.0000 mL | Freq: Two times a day (BID) | ORAL | Status: DC
  Administered 2014-06-19 – 2014-06-25 (×5): 237 mL via ORAL

## 2014-06-15 MED ORDER — OLANZAPINE 10 MG PO TBDP
5.0000 mg | ORAL_TABLET | Freq: Three times a day (TID) | ORAL | Status: DC | PRN
  Administered 2014-06-16: 5 mg via ORAL
  Filled 2014-06-15 (×4): qty 1

## 2014-06-15 MED ORDER — TRAZODONE HCL 100 MG PO TABS
100.0000 mg | ORAL_TABLET | Freq: Every day | ORAL | Status: DC
  Administered 2014-06-15 – 2014-06-18 (×4): 100 mg via ORAL
  Filled 2014-06-15 (×5): qty 1

## 2014-06-15 MED ORDER — PROPRANOLOL HCL 10 MG PO TABS
10.0000 mg | ORAL_TABLET | Freq: Three times a day (TID) | ORAL | Status: DC
  Administered 2014-06-16 – 2014-06-19 (×5): 10 mg via ORAL
  Filled 2014-06-15 (×17): qty 1

## 2014-06-15 MED ORDER — ADULT MULTIVITAMIN W/MINERALS CH
1.0000 | ORAL_TABLET | Freq: Every day | ORAL | Status: DC
  Administered 2014-06-17 – 2014-06-25 (×8): 1 via ORAL
  Filled 2014-06-15 (×12): qty 1
  Filled 2014-06-15: qty 14

## 2014-06-15 MED ORDER — LORAZEPAM 1 MG PO TABS
1.0000 mg | ORAL_TABLET | Freq: Four times a day (QID) | ORAL | Status: DC | PRN
  Administered 2014-06-16 – 2014-06-24 (×4): 1 mg via ORAL
  Filled 2014-06-15 (×5): qty 1

## 2014-06-15 NOTE — Progress Notes (Signed)
Patient ID: Natalie [name], female   DOB: Apr 14, 1991, 23 y.o.   MRN: [id number] D-Hesitant in taking am medications. Intense stare. Engineer, maintenance (IT) of giving her MOM last night to go to the bathroom and she said that is not what it is for.Long pauses between statements to writer, thought blocking. She also refused the Colace stating she didn't ask for it.Offers little. A-Informed Clinical research associate wasn't here last night when getting her MOM but that it is for [facility] and nurse trying to help her. Continues to stare intensely at writer and be slow to respond. Angry affect. After a very long hesitation did take am medications except the Colace, refusing it. Support offered. Monitored for safety.Medication education. R-No complaints verbalized. No patient interaction noted. Slow and guarded with writer with intense staring.No behavior issues.

## 2014-06-15 NOTE — Progress Notes (Signed)
[facility] MD Progress Note  06/15/2014 11:26 AM Natalie [name]  MRN:  [id number] Subjective:   Patient states "I feel depressed. I think I am getting the wrong medications. They look different to me. I have no appetite. I don't belong here. I belong on a psychiatric ward."   Objective:  Patient is seen and chart is reviewed. She is assessed in her room. The patient is noted to make very poor eye contact during assessment starting at the floor most of the time. Her thought processes are extremely disorganized and slow. There are long pauses before the patient will answer and the question also needed to be repeated for her. Patient reports symptoms of depression and paranoia. She denies hearing voices but appears to be internally preoccupied. This morning the patient accused her RN of giving her MOM last night on purpose to make her go to the bathroom. The staff member pointed out that she was not here last night. Patient noted to have intense stare and affect becomes angry at times. So far patient has been compliant with her medications.   Diagnosis:   DSM5: Total Time spent with patient: 30 minutes AXIS I: Bipolar 1 disorder, most recent episode mixed with psychosis  AXIS II: Cluster B Traits  AXIS III:  Past Medical History   Diagnosis  Date   .  Psychiatric diagnosis    .  Chronic bipolar disorder    .  OCD (obsessive compulsive disorder)    .  H/O suicide attempt  05-2013     OD   .  Anxiety    .  Depression     AXIS IV: economic problems, housing problems, occupational problems, other psychosocial or environmental problems, problems with access to health care services and problems with primary support group  AXIS V: 21-30 behavior considerably influenced by delusions or hallucinations OR serious impairment in judgment, communication OR inability to function in almost all areas  ADL's:  Intact  Sleep: Poor  Appetite:  Poor  Suicidal Ideation:  Denies Homicidal Ideation:   Denies AEB (as evidenced by):  Psychiatric Specialty Exam: Physical Exam  Review of Systems  Constitutional: Negative for fever, chills, weight loss, malaise/fatigue and diaphoresis.  HENT: Negative for congestion, ear discharge, ear pain, hearing loss, nosebleeds and tinnitus.   Eyes: Negative for blurred vision, double vision, photophobia, pain, discharge and redness.  Respiratory: Negative for cough, hemoptysis, sputum production, shortness of breath and stridor.   Cardiovascular: Negative for chest pain, palpitations, orthopnea, claudication and leg swelling.  Gastrointestinal: Negative for heartburn, nausea, vomiting, abdominal pain, diarrhea, constipation and blood in stool.  Genitourinary: Negative for dysuria, urgency, frequency and hematuria.  Musculoskeletal: Negative for back pain, joint pain, myalgias and neck pain.  Skin: Negative for itching and rash.  Neurological: Negative for dizziness, tingling, tremors, sensory change, speech change, focal weakness, seizures and headaches.  Endo/Heme/Allergies: Negative for environmental allergies. Does not bruise/bleed easily.  Psychiatric/Behavioral: Positive for depression and hallucinations. The patient is nervous/anxious and has insomnia.     Blood pressure 133/82, pulse 124, temperature 99 F (37.2 C), temperature source Oral, resp. rate 20, height 5\' 4"  (1.626 m), weight 84.369 kg (186 lb), last menstrual period 05/26/2014, SpO2 100.00%.Body mass index is 31.91 kg/(m^2).  General Appearance: Casual  Eye Contact::  Poor  Speech:  Blocked and Slow  Volume:  Decreased  Mood:  Dysphoric and Irritable  Affect:  Constricted  Thought Process:  Disorganized  Orientation:  Full (Time, Place, and Person)  Thought  Content:  Hallucinations: Auditory and Paranoid Ideation  Suicidal Thoughts:  No  Homicidal Thoughts:  No  Memory:  Immediate;   Fair Recent;   Fair Remote;   Fair  Judgement:  Impaired  Insight:  Lacking  Psychomotor  Activity:  Decreased  Concentration:  Fair  Recall:  Poor  Fund of Knowledge:Fair  Language: Fair  Akathisia:  No  Handed:  Right  AIMS (if indicated):     Assets:  Communication Skills Desire for Improvement Physical Health  Sleep:  Number of Hours: 2   Musculoskeletal: Strength & Muscle Tone: within normal limits Gait & Station: normal Patient leans: N/A  Current Medications: Current Facility-Administered Medications  Medication Dose Route Frequency Provider Last Rate Last Dose  . acetaminophen (TYLENOL) tablet 650 mg  650 mg Oral Q6H PRN Kerry Hough, PA-C      . alum & mag hydroxide-simeth (MAALOX/MYLANTA) 200-200-20 MG/5ML suspension 30 mL  30 mL Oral Q4H PRN Kerry Hough, PA-C      . amantadine (SYMMETREL) capsule 100 mg  100 mg Oral BID Kerry Hough, PA-C   100 mg at 06/15/14 5697  . ARIPiprazole (ABILIFY) tablet 10 mg  10 mg Oral BID PC Kerry Hough, PA-C   10 mg at 06/15/14 9480  . docusate sodium (COLACE) capsule 100 mg  100 mg Oral Daily Tristain Daily   100 mg at 06/15/14 0811  . hydrOXYzine (ATARAX/VISTARIL) tablet 25 mg  25 mg Oral Q6H PRN Kerry Hough, PA-C   25 mg at 06/14/14 1353  . lamoTRIgine (LAMICTAL) tablet 200 mg  200 mg Oral Daily Kerry Hough, PA-C   200 mg at 06/15/14 1655  . magnesium hydroxide (MILK OF MAGNESIA) suspension 30 mL  30 mL Oral Daily PRN Kerry Hough, PA-C   30 mL at 06/14/14 2048  . propranolol (INDERAL) tablet 10 mg  10 mg Oral TID Sohil Timko      . traZODone (DESYREL) tablet 100 mg  100 mg Oral QHS Vickki Igou        Lab Results: No results found for this or any previous visit (from the past 48 hour(s)).  Physical Findings: AIMS: Facial and Oral Movements Muscles of Facial Expression: None, normal Lips and Perioral Area: None, normal Jaw: None, normal Tongue: None, normal,Extremity Movements Upper (arms, wrists, hands, fingers): None, normal Lower (legs, knees, ankles, toes): None, normal, Trunk  Movements Neck, shoulders, hips: None, normal, Overall Severity Severity of abnormal movements (highest score from questions above): None, normal Incapacitation due to abnormal movements: None, normal Patient's awareness of abnormal movements (rate only patient's report): No Awareness, Dental Status Current problems with teeth and/or dentures?: No Does patient usually wear dentures?: No  CIWA:    COWS:     Treatment Plan Summary: Daily contact with patient to assess and evaluate symptoms and progress in treatment Medication management  Plan: 1. Continue crisis management and stabilization.  2. Medication management:  -Continue Amantadine 100 mg BID for EPS prevention -Continue Abilify 10 mg BID for psychosis -Continue Lamictal 200 mg daily for improved mood stability -Initiate Inderal 10 mg TID for anxiety/tachycardia -Discontinue Doxepin due to ineffectiveness -Initiate Trazodone 100 mg hs for insomnia 3. Encouraged patient to attend groups and participate in group counseling sessions and activities.  4. Discharge plan in progress.  5. Continue current treatment plan.  6. Address health issues: EKG to evaluate tachycardia.   Medical Decision Making Problem Points:  Established problem, worsening (2), Review of last  therapy session (1) and Review of psycho-social stressors (1) Data Points:  Order Aims Assessment (2) Review or order clinical lab tests (1) Review and summation of old records (2) Review of medication regiment & side effects (2) Review of new medications or change in dosage (2)  I certify that inpatient services furnished can reasonably be expected to improve the patient's condition.   Fransisca Kaufmann NP-C 06/15/2014, 11:26 AM  Patient seen, evaluated and I agree with notes by Nurse Practitioner. Thedore Mins, MD

## 2014-06-15 NOTE — Progress Notes (Signed)
NUTRITION ASSESSMENT  Pt identified as at risk on the Malnutrition Screen Tool  INTERVENTION: Staff to monitor intake.  Please consult for any nutritional needs.  MVI daily Ensure Complete bid.  NUTRITION DIAGNOSIS: Unintentional weight loss related to sub-optimal intake as evidenced by pt report.   Goal: Pt to meet >/= 90% of their estimated nutrition needs.  Monitor:  PO intake  Assessment:  Patient admitted with Bipolar disorder, paranoia, mixed psychosis, cluster B traits.  Patient slow to respond.  "I have to go."  "I don't want to be here."  Eventually patient stated that she was not eating well.  Trying to escape.  54 lb (22%) weight loss in the past 4 months. 5% in the past 2 weeks.  23 y.o. female  Height: Ht Readings from Last 1 Encounters:  06/13/14 5\' 4"  (1.626 m)    Weight: Wt Readings from Last 1 Encounters:  06/13/14 186 lb (84.369 kg)    Weight Hx: Wt Readings from Last 10 Encounters:  06/13/14 186 lb (84.369 kg)  06/12/14 186 lb (84.369 kg)  05/29/14 195 lb (88.451 kg)  05/26/14 223 lb (101.152 kg)  02/13/14 240 lb (108.863 kg)  12/22/13 239 lb (108.41 kg)    BMI:  Body mass index is 31.91 kg/(m^2). Pt meets criteria for obesity grade 1 based on current BMI.  Estimated Nutritional Needs: Kcal: 25-30 kcal/kg Protein: > 1 gram protein/kg Fluid: 1 ml/kcal  Diet Order: General Pt is also offered choice of unit snacks mid-morning and mid-afternoon.  Pt is eating as desired.   Lab results and medications reviewed.   12/24/13, RD, LDN Clinical Inpatient Dietitian Pager:  [phone] Weekend and after hours pager:  [phone]

## 2014-06-15 NOTE — Progress Notes (Signed)
[facility] Group Notes:  (Nursing/MHT/Case Management/Adjunct)  Date:  06/15/2014  Time:  9:24 PM  Type of Therapy:  Group Therapy  Participation Level:  Did Not Attend  Participation Quality:  Did not Attend  Affect:  Did not Attend  Cognitive:  Did not Attend  Insight:  None  Engagement in Group:  Did not Attend  Modes of Intervention:  Socialization and Support  Summary of Progress/Problems: Pt. Resting in room.  Sondra Come 06/15/2014, 9:24 PM

## 2014-06-15 NOTE — [facility] Group Notes (Signed)
[facility] LCSW Aftercare Discharge Planning Group Note   06/15/2014 11:04 AM  Participation Quality:  Did not attend  Mood/Affect:     Natalie [name]

## 2014-06-15 NOTE — Progress Notes (Signed)
Patient ID: Natalie [name], female   DOB: [birth year], 23 y.o.   MRN: [id number] Pt visible in the milieu.  Minimal interaction with staff and peers.  Initially patient not responding to RN at all.  After approximately an hour patient would respond to questions from RN with a yes/no answer; response very slow.  Patient would not respond to questions requiring more than a yes/no answer.  Pt did not attend group. Pt stood in the hall and often looked into the group room with intense stare.  Needs assessed. Pt replied "no".  Denied SI, HI and AVH.  Fifteen minute checks in progress. Pt safe on unit.

## 2014-06-15 NOTE — Progress Notes (Signed)
Patient ID: Natalie [name], female   DOB: 1991/07/04, 23 y.o.   MRN: [id number] Husband and a brother showed up during visitation and were on the unit visiting with her. They requested information on her status. She has not signed a release for me to discuss her case with them. When asked if she wanted me to be able to speak with them she would not respond, when husband asked her she said "no" Brother left but husband stayed behind to visit with her.She has stood all shift. She has refused all medications offered since breakfast including the Ensure.She has had a poor appetite.

## 2014-06-15 NOTE — [facility] Group Notes (Signed)
[facility] LCSW Group Therapy  06/15/2014  1:05 PM  Type of Therapy:  Group therapy  Participation Level:  Did not attend  Summary of Progress/Problems:  Chaplain was here to lead a group on themes of hope and courage.  Ida Rogue 06/15/2014 12:35 PM
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
## 2014-06-15 NOTE — Progress Notes (Signed)
Report received from Jarome Matin RN. Writer walked out on the hallway to make rounds on patients and she was standing in the hallway by the MHT on the hall. Writer introduced self to patient and asked if she was ok and if she was sleepy. She reported that something was in her room and she could not go in there. Writer assured patient nothing was in there and reported back to after making rounds that nothing and no one was in her room. Patient still hesitant about returning to her room. MHT on the hall offered to move her chair by her room so that she would feel safe but patient did  not feel reassured.  Writer will reassess situation again shortly. Safety maintained with 15 min checks in place.

## 2014-06-16 MED ORDER — OLANZAPINE 10 MG PO TBDP
5.0000 mg | ORAL_TABLET | Freq: Three times a day (TID) | ORAL | Status: DC | PRN
  Filled 2014-06-16: qty 1

## 2014-06-16 MED ORDER — OLANZAPINE 10 MG IM SOLR
INTRAMUSCULAR | Status: AC
  Filled 2014-06-16: qty 10

## 2014-06-16 MED ORDER — OLANZAPINE 10 MG IM SOLR
5.0000 mg | Freq: Three times a day (TID) | INTRAMUSCULAR | Status: DC | PRN
  Administered 2014-06-16 – 2014-06-17 (×2): 5 mg via INTRAMUSCULAR

## 2014-06-16 NOTE — Progress Notes (Signed)
Writer observed patient attempting to make her bed and assisted her. She has been in and out of sleep and has been unsteady on her feet. She has been isolative to her room and appeared to be fighting sleep. Writer encouraged and assisted her with getting in her bed and resting. She was willing and cooperative. She denies si/hi/a/v hallucinations but still has thought blocking. Scheduled medication given, patient currently lying in bed resting. Safety maintained with 15 min checks in place.

## 2014-06-16 NOTE — [facility] Group Notes (Signed)
[facility] Group Notes: (Clinical Social Work)   06/16/2014      Type of Therapy:  Group Therapy   Participation Level:  Did Not Attend    Marius Betts Grossman-Orr, LCSW 06/16/2014, 11:56 AM     

## 2014-06-16 NOTE — [facility] Group Notes (Signed)
[facility] Group Notes:  (Nursing/MHT/Case Management/Adjunct)  Date:  06/16/2014  Time:  9:50AM  Type of Therapy:  Self Inventory  Participation Level:  Did Not Attend    Summary of Progress/Problems:  Natalie [name] 06/16/2014, 11:36 AM

## 2014-06-16 NOTE — Progress Notes (Signed)
Patient ID: Natalie [name], female   DOB: [birth year], 23 y.o.   MRN: [id number] [facility] MD Progress Note  06/16/2014 9:56 AM Natalie [name]  MRN:  [id number] Subjective:   Patient does not describe any issues  Objective:  Patient was seen with RN and chart was  reviewed. She presents as poorly related, making good eye contact, but refusing to answer most questions, appearing irritable, and at times smiling for no apparent reason. She states she feels " fine" and denies depression. She denies hallucinations. As discussed with RN/staff patient has been irritable, selectively mute, with (+) thought blocking, and needs significant encouragement from staff to take her medications. She has at times appeared fearful, and as per chart notes, was afraid of entering her room yesterday and needed reassurance from staff.   Diagnosis:   DSM5: Total Time spent with patient: 30 minutes AXIS I: Bipolar 1 disorder, most recent episode mixed with psychosis  AXIS II: Cluster B Traits  AXIS III:  Past Medical History   Diagnosis  Date   .  Psychiatric diagnosis    .  Chronic bipolar disorder    .  OCD (obsessive compulsive disorder)    .  H/O suicide attempt  05-2013     OD   .  Anxiety    .  Depression     AXIS IV: economic problems, housing problems, occupational problems, other psychosocial or environmental problems, problems with access to health care services and problems with primary support group  AXIS V: 21-30 behavior considerably influenced by delusions or hallucinations OR serious impairment in judgment, communication OR inability to function in almost all areas  ADL's:  fair  Sleep: Poor  Appetite:  Poor  Suicidal Ideation:  Denies Homicidal Ideation:  Denies AEB (as evidenced by):  Psychiatric Specialty Exam: Physical Exam  Review of Systems  Constitutional: Negative for fever, chills, weight loss, malaise/fatigue and diaphoresis.  HENT: Negative for congestion, ear  discharge, ear pain, hearing loss, nosebleeds and tinnitus.   Eyes: Negative for blurred vision, double vision, photophobia, pain, discharge and redness.  Respiratory: Negative for cough, hemoptysis, sputum production, shortness of breath and stridor.   Cardiovascular: Negative for chest pain, palpitations, orthopnea, claudication and leg swelling.  Gastrointestinal: Negative for heartburn, nausea, vomiting, abdominal pain, diarrhea, constipation and blood in stool.  Genitourinary: Negative for dysuria, urgency, frequency and hematuria.  Musculoskeletal: Negative for back pain, joint pain, myalgias and neck pain.  Skin: Negative for itching and rash.  Neurological: Negative for dizziness, tingling, tremors, sensory change, speech change, focal weakness, seizures and headaches.  Endo/Heme/Allergies: Negative for environmental allergies. Does not bruise/bleed easily.  Psychiatric/Behavioral: Positive for depression and hallucinations. The patient is nervous/anxious and has insomnia.     Blood pressure 125/76, pulse 115, temperature 99 F (37.2 C), temperature source Oral, resp. rate 18, height 5\' 4"  (1.626 m), weight 84.369 kg (186 lb), last menstrual period 05/26/2014, SpO2 100.00%.Body mass index is 31.91 kg/(m^2).  General Appearance: fairly groomed   05/28/2014::  Good, but mostly stares , appearing angry or irritable  Speech:  Blocked and Slow- mostly selectively mute at this time  Volume:  Decreased  Mood:  Dysphoric and Irritable  Affect: blunted - inappropriate affect ( smiles for no apparent reason) at times   Thought Process:  Disorganized-   Orientation:  Full (Time, Place, and Person)  Thought Content:  Hallucinations: Auditory and Paranoid Ideation- patient denies hallucinations, but at times presents as internally preoccupied   Suicidal Thoughts:  No- patient is not endorsing suicidal or homicidal idations  Homicidal Thoughts:  No  Memory: NA   Judgement:  Poor  Insight:   Lacking  Psychomotor Activity:  Decreased  Concentration:  Fair  Recall:  Poor  Fund of Knowledge:Fair  Language: Fair  Akathisia:  No  Handed:  Right  AIMS (if indicated):     Assets:  Communication Skills Desire for Improvement Physical Health  Sleep:  Number of Hours: 1   Musculoskeletal: Strength & Muscle Tone: within normal limits Gait & Station: normal Patient leans: N/A  Current Medications: Current Facility-Administered Medications  Medication Dose Route Frequency Provider Last Rate Last Dose  . acetaminophen (TYLENOL) tablet 650 mg  650 mg Oral Q6H PRN Kerry Hough, PA-C      . alum & mag hydroxide-simeth (MAALOX/MYLANTA) 200-200-20 MG/5ML suspension 30 mL  30 mL Oral Q4H PRN Kerry Hough, PA-C      . amantadine (SYMMETREL) capsule 100 mg  100 mg Oral BID Kerry Hough, PA-C   100 mg at 06/16/14 0818  . ARIPiprazole (ABILIFY) tablet 10 mg  10 mg Oral BID PC Kerry Hough, PA-C   10 mg at 06/15/14 9476  . docusate sodium (COLACE) capsule 100 mg  100 mg Oral Daily Mojeed Akintayo   100 mg at 06/16/14 0815  . feeding supplement (ENSURE COMPLETE) (ENSURE COMPLETE) liquid 237 mL  237 mL Oral BID BM Jeoffrey Massed, RD      . hydrOXYzine (ATARAX/VISTARIL) tablet 25 mg  25 mg Oral Q6H PRN Kerry Hough, PA-C   25 mg at 06/14/14 1353  . lamoTRIgine (LAMICTAL) tablet 200 mg  200 mg Oral Daily Kerry Hough, PA-C   200 mg at 06/16/14 0818  . LORazepam (ATIVAN) tablet 1 mg  1 mg Oral Q6H PRN Fransisca Kaufmann, NP      . magnesium hydroxide (MILK OF MAGNESIA) suspension 30 mL  30 mL Oral Daily PRN Kerry Hough, PA-C   30 mL at 06/14/14 2048  . multivitamin with minerals tablet 1 tablet  1 tablet Oral Daily Jeoffrey Massed, RD      . OLANZapine zydis (ZYPREXA) disintegrating tablet 5 mg  5 mg Oral Q8H PRN Fransisca Kaufmann, NP      . propranolol (INDERAL) tablet 10 mg  10 mg Oral TID Mojeed Akintayo      . traZODone (DESYREL) tablet 100 mg  100 mg Oral QHS Mojeed Akintayo   100 mg  at 06/15/14 2128    Lab Results: No results found for this or any previous visit (from the past 48 hour(s)).  Physical Findings: AIMS: Facial and Oral Movements Muscles of Facial Expression: None, normal Lips and Perioral Area: None, normal Jaw: None, normal Tongue: None, normal,Extremity Movements Upper (arms, wrists, hands, fingers): None, normal Lower (legs, knees, ankles, toes): None, normal, Trunk Movements Neck, shoulders, hips: None, normal, Overall Severity Severity of abnormal movements (highest score from questions above): None, normal Incapacitation due to abnormal movements: None, normal Patient's awareness of abnormal movements (rate only patient's report): No Awareness, Dental Status Current problems with teeth and/or dentures?: No Does patient usually wear dentures?: No  CIWA:    COWS:     Assessment: At this time patient remains psychotic, guarded, irritable, selectively mute. She is not endorsing medication side effects ( Abilify, Lamictal)   Treatment Plan Summary: Daily contact with patient to assess and evaluate symptoms and progress in treatment Medication management  Plan: 1. Continue inpatient treatment ,  support, and meds as below  Amantadine100 mg BID   Abilify 10 mg BID   Lamictal 200 mg daily   Inderal 10 mg TID    Medical Decision Making Problem Points:  Established problem, worsening (2) and Review of last therapy session (1) Data Points:  Review of medication regiment & side effects (2)  I certify that inpatient services furnished can reasonably be expected to improve the patient's condition.   COBOS, FERNANDO NP-C 06/16/2014, 9:56 AM

## 2014-06-16 NOTE — Progress Notes (Signed)
Didn't attend group 

## 2014-06-16 NOTE — [facility] Group Notes (Signed)
[facility] Group Notes:  (Nursing/MHT/Case Management/Adjunct)  Date:  06/16/2014  Time:  10:05AM  Type of Therapy:  Psychoeducational Skills  Participation Level:  Did Not Attend   Summary of Progress/Problems:  Natalie [name] 06/16/2014, 11:37 AM

## 2014-06-16 NOTE — Progress Notes (Signed)
D: pt is very guarded and childlike. Pt having some thought blocking and gameness. Pt refusing to answer certain questions or taking a while to answer them. Pt has an intense stare while talking to her. Pt refusing to take certain medications. Denies si/hi/avh and pain. Refusing to fill out self inventory. Pt isolative on unit. Pt is having some paranoia and suspicion.  A: pt selective on which medications she wanted to take. q15 min safety checks. 1:1 time offered, but unproductive R: pt remains safe on unit. No physical signs of distress noted.

## 2014-06-17 NOTE — [facility] Group Notes (Signed)
[facility] Group Notes: (Clinical Social Work)   06/17/2014      Type of Therapy:  Group Therapy   Participation Level:  Did Not Attend    Ambrose Mantle, LCSW 06/17/2014, 12:47 PM

## 2014-06-17 NOTE — Progress Notes (Signed)
Patient ID: Natalie [name], female   DOB: [birth year], 23 y.o.   MRN: [id number] [facility] MD Progress Note  06/17/2014 2:29 PM Natalie [name]  MRN:  [id number] Subjective:   Patient states she feels as if she is " falling behind" on her daily activities/schedule.  Objective:  Patient was seen with RN and chart was  reviewed.  Although still blunted in affect and guarded, there is some improvement compared to yesterday, and she is more verbal, at times speaking in soft but full sentences, as compared to yesterday, when she was almost completely selectively mute. She denies hallucinations, although appears internally preoccupied at times, and is worried that she is " falling behind" schedule. She cannot  Explain or elaborate further, so it is hard to determine if this thought has some delusional basis. Patient remains isolative, and often is seen by staff to be standing in her room's doorway. Limited milieu participation. No disruptive behaviors . Patient denies medication side effects.      Diagnosis:   DSM5: Total Time spent with patient: 30 minutes AXIS I: Bipolar 1 disorder, most recent episode mixed with psychosis  AXIS II: Cluster B Traits  AXIS III:  Past Medical History   Diagnosis  Date   .  Psychiatric diagnosis    .  Chronic bipolar disorder    .  OCD (obsessive compulsive disorder)    .  H/O suicide attempt  05-2013     OD   .  Anxiety    .  Depression     AXIS IV: economic problems, housing problems, occupational problems, other psychosocial or environmental problems, problems with access to health care services and problems with primary support group  AXIS V: 21-30 behavior considerably influenced by delusions or hallucinations OR serious impairment in judgment, communication OR inability to function in almost all areas  ADL's:  fair  Sleep: fair   Appetite:  Fair   Suicidal Ideation:  Denies Homicidal Ideation:  Denies AEB (as evidenced by):  Psychiatric  Specialty Exam: Physical Exam  Review of Systems  Constitutional: Negative for fever, chills, weight loss, malaise/fatigue and diaphoresis.  HENT: Negative for congestion, ear discharge, ear pain, hearing loss, nosebleeds and tinnitus.   Eyes: Negative for blurred vision, double vision, photophobia, pain, discharge and redness.  Respiratory: Negative for cough, hemoptysis, sputum production, shortness of breath and stridor.   Cardiovascular: Negative for chest pain, palpitations, orthopnea, claudication and leg swelling.  Gastrointestinal: Negative for heartburn, nausea, vomiting, abdominal pain, diarrhea, constipation and blood in stool.  Genitourinary: Negative for dysuria, urgency, frequency and hematuria.  Musculoskeletal: Negative for back pain, joint pain, myalgias and neck pain.  Skin: Negative for itching and rash.  Neurological: Negative for dizziness, tingling, tremors, sensory change, speech change, focal weakness, seizures and headaches.  Endo/Heme/Allergies: Negative for environmental allergies. Does not bruise/bleed easily.  Psychiatric/Behavioral: Positive for depression and hallucinations. The patient is nervous/anxious and has insomnia.     Blood pressure 142/91, pulse 79, temperature 99.2 F (37.3 C), temperature source Oral, resp. rate 18, height 5\' 4"  (1.626 m), weight 84.369 kg (186 lb), last menstrual period 05/26/2014, SpO2 100.00%.Body mass index is 31.91 kg/(m^2).  General Appearance: fairly groomed   05/28/2014::  Good,still with a tendency to stare  Speech:  Blocked and Slow , still thought blocking , but more verbal than yesterday  Volume:  Decreased  Mood:  Anxious  Affect: blunted  Thought Process:  Disorganized-   Orientation:  NA, fully alert and attentive  Thought Content:   patient denies hallucinations, but at times presents as internally preoccupied , appears guarded   Suicidal Thoughts:  No- patient is not endorsing suicidal or homicidal idations   Homicidal Thoughts:  No  Memory: NA   Judgement:  Poor  Insight:  Lacking  Psychomotor Activity:  Decreased  Concentration:  Fair  Recall:  Poor  Fund of Knowledge:Fair  Language: Fair  Akathisia:  No  Handed:  Right  AIMS (if indicated):     Assets:  Communication Skills Desire for Improvement Physical Health  Sleep:  Number of Hours: 5.5   Musculoskeletal: Strength & Muscle Tone: within normal limits Gait & Station: normal Patient leans: N/A  Current Medications: Current Facility-Administered Medications  Medication Dose Route Frequency Provider Last Rate Last Dose  . acetaminophen (TYLENOL) tablet 650 mg  650 mg Oral Q6H PRN Kerry Hough, PA-C      . alum & mag hydroxide-simeth (MAALOX/MYLANTA) 200-200-20 MG/5ML suspension 30 mL  30 mL Oral Q4H PRN Kerry Hough, PA-C      . amantadine (SYMMETREL) capsule 100 mg  100 mg Oral BID Kerry Hough, PA-C   100 mg at 06/17/14 0800  . ARIPiprazole (ABILIFY) tablet 10 mg  10 mg Oral BID PC Spencer E Simon, PA-C   10 mg at 06/17/14 0800  . docusate sodium (COLACE) capsule 100 mg  100 mg Oral Daily Mojeed Akintayo   100 mg at 06/17/14 0800  . feeding supplement (ENSURE COMPLETE) (ENSURE COMPLETE) liquid 237 mL  237 mL Oral BID BM Jeoffrey Massed, RD      . hydrOXYzine (ATARAX/VISTARIL) tablet 25 mg  25 mg Oral Q6H PRN Kerry Hough, PA-C   25 mg at 06/14/14 1353  . lamoTRIgine (LAMICTAL) tablet 200 mg  200 mg Oral Daily Kerry Hough, PA-C   200 mg at 06/17/14 0801  . LORazepam (ATIVAN) tablet 1 mg  1 mg Oral Q6H PRN Fransisca Kaufmann, NP   1 mg at 06/16/14 1711  . magnesium hydroxide (MILK OF MAGNESIA) suspension 30 mL  30 mL Oral Daily PRN Kerry Hough, PA-C   30 mL at 06/14/14 2048  . multivitamin with minerals tablet 1 tablet  1 tablet Oral Daily Jeoffrey Massed, RD   1 tablet at 06/17/14 0801  . OLANZapine zydis (ZYPREXA) disintegrating tablet 5 mg  5 mg Oral Q8H PRN Beau Fanny, FNP       Or  . OLANZapine (ZYPREXA)  injection 5 mg  5 mg Intramuscular Q8H PRN Beau Fanny, FNP   5 mg at 06/16/14 1745  . propranolol (INDERAL) tablet 10 mg  10 mg Oral TID Mojeed Akintayo   10 mg at 06/17/14 0800  . traZODone (DESYREL) tablet 100 mg  100 mg Oral QHS Mojeed Akintayo   100 mg at 06/16/14 2120    Lab Results: No results found for this or any previous visit (from the past 48 hour(s)).  Physical Findings: AIMS: Facial and Oral Movements Muscles of Facial Expression: None, normal Lips and Perioral Area: None, normal Jaw: None, normal Tongue: None, normal,Extremity Movements Upper (arms, wrists, hands, fingers): None, normal Lower (legs, knees, ankles, toes): None, normal, Trunk Movements Neck, shoulders, hips: None, normal, Overall Severity Severity of abnormal movements (highest score from questions above): None, normal Incapacitation due to abnormal movements: None, normal Patient's awareness of abnormal movements (rate only patient's report): No Awareness, Dental Status Current problems with teeth and/or dentures?: No Does patient usually wear  dentures?: No  CIWA:    COWS:     Assessment: Some improvement compared to yesterday- less selectively mute, more verbal communication. Still blunted, guarded, (+) thought blocking. No medication side effectsa ( Abilify, Lamictal)   Treatment Plan Summary: Daily contact with patient to assess and evaluate symptoms and progress in treatment Medication management  Plan: 1. Continue inpatient treatment , support, and meds as below  Amantadine100 mg BID   Abilify 10 mg BID   Lamictal 200 mg daily   Inderal 10 mg TID   2. Treatment team will reassess in AM  Medical Decision Making Problem Points:  Established problem, stable/improving (1) and Review of last therapy session (1) Data Points:  Review of medication regiment & side effects (2)  I certify that inpatient services furnished can reasonably be expected to improve the patient's condition.   COBOS,  FERNANDO NP-C 06/17/2014, 2:29 PM

## 2014-06-17 NOTE — Progress Notes (Signed)
D: pt denies si/hi/avh and pain. Pt isnt as guarded today as yesterday, but pt is still having some thought blocking. Pt stated, " i just feel so behind. i cant do everything that i need to do." pt goes back and forth as to how she is feeling, whether is frustrated, sad, or irritated. Pt refusing to answer phone calls. Pt did take all of morning medications without any conflict. Pt less irritable and anxious this morning. Pt still standing at doorway and avoiding eye contact with staff.  A: scheduled medication given. Support and encouragement offered. 1:1 time given. q 15 min safety checks R: pt remains safe on unit. No signs of distress noted.

## 2014-06-17 NOTE — Progress Notes (Signed)
Did not attend group 

## 2014-06-18 LAB — GLUCOSE, CAPILLARY: GLUCOSE-CAPILLARY: 86 mg/dL (ref 70–99)

## 2014-06-18 NOTE — Progress Notes (Signed)
The focus of this group is to help patients review their daily goal of treatment and discuss progress on daily workbooks. Pt declined to attend the evening group. 

## 2014-06-18 NOTE — [facility] Group Notes (Signed)
[facility] LCSW Group Therapy  06/18/2014 1:15 pm  Type of Therapy: Process Group Therapy  Participation Level:  Did not attend  Summary of Progress/Problems: Today's group addressed the issue of overcoming obstacles.  Patients were asked to identify their biggest obstacle post d/c that stands in the way of their on-going success, and then problem solve as to how to manage this.  Natalie [name] 06/18/2014   2:26 PM

## 2014-06-18 NOTE — Progress Notes (Signed)
Nursing Shift Assessment. 7a-7p.  D: Pt stood in the medication window this morning with a blank stare on her face. She answered questions minimally. She kept looking at her medications and would not take them. She finally refused to take them because they did not look right to her. She was concerned that she sees a doctor and said that she has not seen one lately.   A: Encouraged pt to take her medications. Safety checks continue every 15 minutes.  R: Unable or unwilling to participate in treatment at this time.

## 2014-06-18 NOTE — Progress Notes (Signed)
Became patients nurse at 11am, attempted to give patient her lunchtime medication but patient refused all medication. This writer attempted to explain the purpose of her medication and why it is important, but despite attempts pt continued to refuse medication. This Clinical research associate tried again at 2:30pm to give patient her medication, patient continued to refuse.

## 2014-06-18 NOTE — Progress Notes (Signed)
Writer has observed patient standing in her doorway with a blank flat stare. Writer talked with patient 1:1 in her room.  She was slow to answer but did respond to my questions, she reported that her mother, father and older brother visited on today. She became tearful when she spoke of her brother reporting that she would like to see him more. Writer explained to patient that she needs to be more verbal so that people will know what she is thinking or going on with her. She requested another schedule for the week because she had lost her other one which she received. Writer encouraged her to shower daily and talk to staff so that we will be able to better help her. She was receptive to advice. She still continues to either stand in her doorway or out in the hall frequently, Clinical research associate offered redirection when need and she would be slow to respond but would eventually cooperate. Safety maintained on unit with 15 min checks.
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
## 2014-06-18 NOTE — [facility] Group Notes (Signed)
[facility] LCSW Aftercare Discharge Planning Group Note   06/18/2014 11:03 AM  Participation Quality:  Did not attend.  After group found pt in hallway.  Asked if she wanted to call parents with me in response to request she had made last week.  She nodded in affirmative, but when I Held open the consult door for her, she refused to go in.  Told her we would try again later.      Daryel Gerald B

## 2014-06-18 NOTE — Progress Notes (Signed)
Patient ID: Natalie [name], female   DOB: 1991-01-23, 23 y.o.   MRN: [id number] [facility] [place] MD Progress Note  06/18/2014 11:14 AM Natalie [name]  MRN:  [id number] Subjective:   Patient states:" I want the list of all my medications. I want to go home but I don't know whether to return home to my parents or gets my own place.''  Objective:  Patient was seen  and her chart was  reviewed. She appears less agitated or irritable today. However, she remains demanding, defiant and oppositional. Her behavior remains bizarre and thought process disorganized. She is guarded, evasive, with increased verbal output but preoccupied with what medications she is taking and how her medications worked despite numerous explanations about the same topic in the past. She is socially isolated with minimal interaction with peers, she has not been participating fully in the unit milieu. So far, patient has been compliant with her medications and denies adverse reactions.    Diagnosis:   DSM5: Total Time spent with patient: 20 minutes AXIS I: Bipolar 1 disorder, most recent episode mixed with psychosis  AXIS II: Cluster B Traits  AXIS III:  Past Medical History   Diagnosis  Date   .  Psychiatric diagnosis    .  Chronic bipolar disorder    .  OCD (obsessive compulsive disorder)    .  H/O suicide attempt  05-2013     OD   .  Anxiety    .  Depression     AXIS IV: economic problems, housing problems, occupational problems, other psychosocial or environmental problems, problems with access to health care services and problems with primary support group  AXIS V: 21-30 behavior considerably influenced by delusions or hallucinations OR serious impairment in judgment, communication OR inability to function in almost all areas  ADL's:  fair  Sleep: fair   Appetite:  Fair   Suicidal Ideation:  Denies Homicidal Ideation:  Denies AEB (as evidenced by):  Psychiatric Specialty Exam: Physical Exam  Review of  Systems  Constitutional: Negative for fever, chills, weight loss, malaise/fatigue and diaphoresis.  HENT: Negative for congestion, ear discharge, ear pain, hearing loss, nosebleeds and tinnitus.   Eyes: Negative for blurred vision, double vision, photophobia, pain, discharge and redness.  Respiratory: Negative for cough, hemoptysis, sputum production, shortness of breath and stridor.   Cardiovascular: Negative for chest pain, palpitations, orthopnea, claudication and leg swelling.  Gastrointestinal: Negative for heartburn, nausea, vomiting, abdominal pain, diarrhea, constipation and blood in stool.  Genitourinary: Negative for dysuria, urgency, frequency and hematuria.  Musculoskeletal: Negative for back pain, joint pain, myalgias and neck pain.  Skin: Negative for itching and rash.  Neurological: Negative for dizziness, tingling, tremors, sensory change, speech change, focal weakness, seizures and headaches.  Endo/Heme/Allergies: Negative for environmental allergies. Does not bruise/bleed easily.  Psychiatric/Behavioral: Positive for depression and hallucinations. The patient is nervous/anxious and has insomnia.     Blood pressure 134/77, pulse 76, temperature 98.9 F (37.2 C), temperature source Oral, resp. rate 20, height 5\' 4"  (1.626 m), weight 84.369 kg (186 lb), last menstrual period 05/26/2014, SpO2 100.00%.Body mass index is 31.91 kg/(m^2).  General Appearance: fairly groomed   Patent attorney::  Good,still with a tendency to stare  Speech:  Blocked and Slow   Volume:  Decreased  Mood:  Anxious  Affect: blunted  Thought Process:  Disorganized-   Orientation:  NA, fully alert and attentive   Thought Content:   patient denies hallucinations, but at times presents as internally preoccupied ,  appears guarded   Suicidal Thoughts:  No- patient is not endorsing suicidal or homicidal idations  Homicidal Thoughts:  No  Memory: NA   Judgement:  Poor  Insight:  Lacking  Psychomotor Activity:   Decreased  Concentration:  Fair  Recall:  Poor  Fund of Knowledge:Fair  Language: Fair  Akathisia:  No  Handed:  Right  AIMS (if indicated):     Assets:  Communication Skills Desire for Improvement Physical Health  Sleep:  Number of Hours: 2   Musculoskeletal: Strength & Muscle Tone: within normal limits Gait & Station: normal Patient leans: N/A  Current Medications: Current Facility-Administered Medications  Medication Dose Route Frequency Provider Last Rate Last Dose  . acetaminophen (TYLENOL) tablet 650 mg  650 mg Oral Q6H PRN Kerry Hough, PA-C      . alum & mag hydroxide-simeth (MAALOX/MYLANTA) 200-200-20 MG/5ML suspension 30 mL  30 mL Oral Q4H PRN Kerry Hough, PA-C      . amantadine (SYMMETREL) capsule 100 mg  100 mg Oral BID Kerry Hough, PA-C   100 mg at 06/17/14 1714  . ARIPiprazole (ABILIFY) tablet 10 mg  10 mg Oral BID PC Kerry Hough, PA-C   10 mg at 06/17/14 1712  . docusate sodium (COLACE) capsule 100 mg  100 mg Oral Daily Galdino Hinchman   100 mg at 06/17/14 0800  . feeding supplement (ENSURE COMPLETE) (ENSURE COMPLETE) liquid 237 mL  237 mL Oral BID BM Jeoffrey Massed, RD      . hydrOXYzine (ATARAX/VISTARIL) tablet 25 mg  25 mg Oral Q6H PRN Kerry Hough, PA-C   25 mg at 06/14/14 1353  . lamoTRIgine (LAMICTAL) tablet 200 mg  200 mg Oral Daily Kerry Hough, PA-C   200 mg at 06/17/14 0801  . LORazepam (ATIVAN) tablet 1 mg  1 mg Oral Q6H PRN Fransisca Kaufmann, NP   1 mg at 06/18/14 0228  . magnesium hydroxide (MILK OF MAGNESIA) suspension 30 mL  30 mL Oral Daily PRN Kerry Hough, PA-C   30 mL at 06/14/14 2048  . multivitamin with minerals tablet 1 tablet  1 tablet Oral Daily Jeoffrey Massed, RD   1 tablet at 06/17/14 0801  . OLANZapine zydis (ZYPREXA) disintegrating tablet 5 mg  5 mg Oral Q8H PRN Beau Fanny, FNP       Or  . OLANZapine (ZYPREXA) injection 5 mg  5 mg Intramuscular Q8H PRN Beau Fanny, FNP   5 mg at 06/17/14 1605  . propranolol  (INDERAL) tablet 10 mg  10 mg Oral TID Nilda Keathley   10 mg at 06/17/14 1712  . traZODone (DESYREL) tablet 100 mg  100 mg Oral QHS Nelson Noone   100 mg at 06/17/14 2052    Lab Results:  Results for orders placed during the hospital encounter of 06/13/14 (from the past 48 hour(s))  GLUCOSE, CAPILLARY     Status: None   Collection Time    06/18/14  6:25 AM      Result Value Ref Range   Glucose-Capillary 86  70 - 99 mg/dL   Comment 1 Documented in Chart      Physical Findings: AIMS: Facial and Oral Movements Muscles of Facial Expression: None, normal Lips and Perioral Area: None, normal Jaw: None, normal Tongue: None, normal,Extremity Movements Upper (arms, wrists, hands, fingers): None, normal Lower (legs, knees, ankles, toes): None, normal, Trunk Movements Neck, shoulders, hips: None, normal, Overall Severity Severity of abnormal movements (highest score  from questions above): None, normal Incapacitation due to abnormal movements: None, normal Patient's awareness of abnormal movements (rate only patient's report): No Awareness, Dental Status Current problems with teeth and/or dentures?: No Does patient usually wear dentures?: No  CIWA:    COWS:     Assessment: Some improvement compared to yesterday- less selectively mute, more verbal communication. Still blunted, guarded, (+) thought blocking. No medication side effects ( Abilify, Lamictal)   Treatment Plan Summary: Daily contact with patient to assess and evaluate symptoms and progress in treatment Medication management  Plan: 1. Continue inpatient treatment , support, and meds as below  -Amantadine100 mg BID for EPS prevention  -Abilify 10 mg BID for delusions, psychosis/mood - Lamictal 200 mg daily for mood -  Inderal 10 mg TID for Tachycardia 2. Treatment team will reassess in AM  Medical Decision Making Problem Points:  Established problem, improving (1) and Review of last therapy session (1) Data Points:   Review of medication regiment & side effects (2)  I certify that inpatient services furnished can reasonably be expected to improve the patient's condition.   Thedore Mins, MD 06/18/2014, 11:14 AM

## 2014-06-18 NOTE — Progress Notes (Signed)
D: Patient stated that this Natalie [name] was not her nurse after I talked with her. Keeps repeating that she needs to talk to someone. States she does not know why she is on [address] instead of [address] hall where she says she believes she should be. Patient asking questions about her medications. Pt said she would take them, then said she wouldn't take them, then changed her mind again and said she would take them. Pt appears angry, guarded, and suspicious. A: Provided patient with medication education about her medication. Provided education about programming on the different hallways and education regarding patient's illness. R: Patient took her po medication. Denies SI or HI at this time. Denies voices. Billy Coast, RN

## 2014-06-19 MED ORDER — PROPRANOLOL HCL 20 MG PO TABS
20.0000 mg | ORAL_TABLET | Freq: Two times a day (BID) | ORAL | Status: DC
  Administered 2014-06-19 – 2014-06-25 (×13): 20 mg via ORAL
  Filled 2014-06-19 (×6): qty 1
  Filled 2014-06-19: qty 28
  Filled 2014-06-19 (×7): qty 1
  Filled 2014-06-19: qty 28
  Filled 2014-06-19: qty 1

## 2014-06-19 MED ORDER — OLANZAPINE 10 MG IM SOLR: 10.0000 mg | Freq: Three times a day (TID) | INTRAMUSCULAR | Status: DC | PRN

## 2014-06-19 MED ORDER — TRAZODONE HCL 150 MG PO TABS
150.0000 mg | ORAL_TABLET | Freq: Every day | ORAL | Status: DC
  Administered 2014-06-19: 150 mg via ORAL
  Filled 2014-06-19 (×3): qty 1

## 2014-06-19 MED ORDER — OLANZAPINE 10 MG PO TBDP
10.0000 mg | ORAL_TABLET | Freq: Three times a day (TID) | ORAL | Status: DC | PRN
  Administered 2014-06-22 – 2014-06-24 (×2): 10 mg via ORAL
  Filled 2014-06-19 (×2): qty 1

## 2014-06-19 NOTE — Tx Team (Signed)
  Interdisciplinary Treatment Plan Update   Date Reviewed:  06/19/2014  Time Reviewed:  8:34 AM  Progress in Treatment:   Attending groups: Yes Participating in groups: Yes Taking medication as prescribed: Yes  Tolerating medication: Yes Family/Significant other contact made: Yes  Patient understands diagnosis: Yes  Discussing patient identified problems/goals with staff: Yes Medical problems stabilized or resolved: Yes Denies suicidal/homicidal ideation: Yes Patient has not harmed self or others: Yes  For review of initial/current patient goals, please see plan of care.  Estimated Length of Stay:  4-5 days  Reason for Continuation of Hospitalization: Hallucinations Medication stabilization Other; describe disorganization  New Problems/Goals identified:  N/A  Discharge Plan or Barriers:   return home, follow up outpt  Additional Comments:  " I want the list of all my medications. I want to go home but I don't know whether to return home to my parents or gets my own place.''   Patient was seen and her chart was reviewed. She appears less agitated or irritable today. However, she remains demanding, defiant and oppositional. Her behavior remains bizarre and thought process disorganized. She is guarded, evasive, with increased verbal output but preoccupied with what medications she is taking and how her medications worked despite numerous explanations about the same topic in the past. She is socially isolated with minimal interaction with peers, she has not been participating fully in the unit milieu.  Pt has not been sleeping.  There is some question about her cheeking her meds.  Today her PRN Zyprexa was increased, and Trazodone was increased from 50 to 150.   Attendees:  Signature: Thedore Mins, MD 06/19/2014 8:34 AM   Signature: Richelle Ito, LCSW 06/19/2014 8:34 AM  Signature: Fransisca Kaufmann, NP 06/19/2014 8:34 AM  Signature: Joslyn Devon, RN 06/19/2014 8:34 AM  Signature: Liborio Nixon, RN 06/19/2014 8:34 AM  Signature:  06/19/2014 8:34 AM  Signature:   06/19/2014 8:34 AM  Signature:    Signature:    Signature:    Signature:    Signature:    Signature:      Scribe for Treatment Team:   Richelle Ito, LCSW  06/19/2014 8:34 AM

## 2014-06-19 NOTE — Progress Notes (Signed)
Patient ID: Natalie [name], female   DOB: May 31, 1991, 23 y.o.   MRN: [id number] [facility] [place] MD Progress Note  06/19/2014 4:46 PM Natalie [name]  MRN:  [id number] Subjective:   Patient states:" I have a question but I can't remember what it is. I'm fine though. I'm better."  Objective:  Patient was seen  and her chart was  reviewed. She appears very flat today. Pt is very slow to respond to questions during the assessment.  Her behavior remains bizarre and thought process disorganized. She is guarded, evasive.. She is socially isolated with minimal interaction with peers, she has not been participating fully in the unit milieu. So far, patient has been compliant with her medications and denies adverse reactions.    Diagnosis:   DSM5: Total Time spent with patient: 20 minutes AXIS I: Bipolar 1 disorder, most recent episode mixed with psychosis  AXIS II: Cluster B Traits  AXIS III:  Past Medical History   Diagnosis  Date   .  Psychiatric diagnosis    .  Chronic bipolar disorder    .  OCD (obsessive compulsive disorder)    .  H/O suicide attempt  05-2013     OD   .  Anxiety    .  Depression     AXIS IV: economic problems, housing problems, occupational problems, other psychosocial or environmental problems, problems with access to health care services and problems with primary support group  AXIS V: 21-30 behavior considerably influenced by delusions or hallucinations OR serious impairment in judgment, communication OR inability to function in almost all areas  ADL's:  fair  Sleep: fair   Appetite:  Fair   Suicidal Ideation:  Denies Homicidal Ideation:  Denies AEB (as evidenced by):  Psychiatric Specialty Exam: Physical Exam  Review of Systems  Constitutional: Negative for fever, chills, weight loss, malaise/fatigue and diaphoresis.  HENT: Negative for congestion, ear discharge, ear pain, hearing loss, nosebleeds and tinnitus.   Eyes: Negative for blurred vision, double  vision, photophobia, pain, discharge and redness.  Respiratory: Negative for cough, hemoptysis, sputum production, shortness of breath and stridor.   Cardiovascular: Negative for chest pain, palpitations, orthopnea, claudication and leg swelling.  Gastrointestinal: Negative for heartburn, nausea, vomiting, abdominal pain, diarrhea, constipation and blood in stool.  Genitourinary: Negative for dysuria, urgency, frequency and hematuria.  Musculoskeletal: Negative for back pain, joint pain, myalgias and neck pain.  Skin: Negative for itching and rash.  Neurological: Negative for dizziness, tingling, tremors, sensory change, speech change, focal weakness, seizures and headaches.  Endo/Heme/Allergies: Negative for environmental allergies. Does not bruise/bleed easily.  Psychiatric/Behavioral: Positive for depression and hallucinations. The patient is nervous/anxious and has insomnia.     Blood pressure 113/67, pulse 73, temperature 97.5 F (36.4 C), temperature source Oral, resp. rate 20, height 5\' 4"  (1.626 m), weight 84.369 kg (186 lb), last menstrual period 05/26/2014, SpO2 100.00%.Body mass index is 31.91 kg/(m^2).  General Appearance: fairly groomed   05/28/2014::  Good,still with a tendency to stare  Speech:  Blocked and Slow   Volume:  Decreased  Mood:  Anxious  Affect: blunted  Thought Process:  Disorganized-   Orientation:  NA, fully alert and attentive   Thought Content:   patient denies hallucinations, but at times presents as internally preoccupied , appears guarded   Suicidal Thoughts:  No- patient is not endorsing suicidal or homicidal idations  Homicidal Thoughts:  No  Memory: NA   Judgement:  Poor  Insight:  Lacking  Psychomotor Activity:  Decreased  Concentration:  Fair  Recall:  Poor  Fund of Knowledge:Fair  Language: Fair  Akathisia:  No  Handed:  Right  AIMS (if indicated):     Assets:  Communication Skills Desire for Improvement Physical Health  Sleep:  Number  of Hours: 0   Musculoskeletal: Strength & Muscle Tone: within normal limits Gait & Station: normal Patient leans: N/A  Current Medications: Current Facility-Administered Medications  Medication Dose Route Frequency Provider Last Rate Last Dose  . acetaminophen (TYLENOL) tablet 650 mg  650 mg Oral Q6H PRN Kerry Hough, PA-C      . alum & mag hydroxide-simeth (MAALOX/MYLANTA) 200-200-20 MG/5ML suspension 30 mL  30 mL Oral Q4H PRN Kerry Hough, PA-C      . amantadine (SYMMETREL) capsule 100 mg  100 mg Oral BID Kerry Hough, PA-C   100 mg at 06/19/14 1630  . ARIPiprazole (ABILIFY) tablet 10 mg  10 mg Oral BID PC Spencer E Simon, PA-C   10 mg at 06/19/14 1630  . docusate sodium (COLACE) capsule 100 mg  100 mg Oral Daily Cauy Melody   100 mg at 06/19/14 0757  . feeding supplement (ENSURE COMPLETE) (ENSURE COMPLETE) liquid 237 mL  237 mL Oral BID BM Jeoffrey Massed, RD   237 mL at 06/19/14 0759  . hydrOXYzine (ATARAX/VISTARIL) tablet 25 mg  25 mg Oral Q6H PRN Kerry Hough, PA-C   25 mg at 06/14/14 1353  . lamoTRIgine (LAMICTAL) tablet 200 mg  200 mg Oral Daily Kerry Hough, PA-C   200 mg at 06/19/14 0757  . LORazepam (ATIVAN) tablet 1 mg  1 mg Oral Q6H PRN Fransisca Kaufmann, NP   1 mg at 06/18/14 0228  . magnesium hydroxide (MILK OF MAGNESIA) suspension 30 mL  30 mL Oral Daily PRN Kerry Hough, PA-C   30 mL at 06/14/14 2048  . multivitamin with minerals tablet 1 tablet  1 tablet Oral Daily Jeoffrey Massed, RD   1 tablet at 06/19/14 0757  . OLANZapine (ZYPREXA) injection 10 mg  10 mg Intramuscular Q8H PRN Shantelle Alles       Or  . OLANZapine zydis (ZYPREXA) disintegrating tablet 10 mg  10 mg Oral Q8H PRN Aleesia Henney      . propranolol (INDERAL) tablet 20 mg  20 mg Oral BID Ardyce Heyer   20 mg at 06/19/14 1630  . traZODone (DESYREL) tablet 150 mg  150 mg Oral QHS Betzayda Braxton        Lab Results:  Results for orders placed during the hospital encounter of 06/13/14 (from  the past 48 hour(s))  GLUCOSE, CAPILLARY     Status: None   Collection Time    06/18/14  6:25 AM      Result Value Ref Range   Glucose-Capillary 86  70 - 99 mg/dL   Comment 1 Documented in Chart      Physical Findings: AIMS: Facial and Oral Movements Muscles of Facial Expression: None, normal Lips and Perioral Area: None, normal Jaw: None, normal Tongue: None, normal,Extremity Movements Upper (arms, wrists, hands, fingers): None, normal Lower (legs, knees, ankles, toes): None, normal, Trunk Movements Neck, shoulders, hips: None, normal, Overall Severity Severity of abnormal movements (highest score from questions above): None, normal Incapacitation due to abnormal movements: None, normal Patient's awareness of abnormal movements (rate only patient's report): No Awareness, Dental Status Current problems with teeth and/or dentures?: No Does patient usually wear dentures?: No  CIWA:    COWS:  Assessment: Some improvement compared to yesterday- less selectively mute, more verbal communication. Still blunted, guarded, (+) thought blocking. No medication side effects ( Abilify, Lamictal)   Treatment Plan Summary: Daily contact with patient to assess and evaluate symptoms and progress in treatment Medication management  Plan: 1. Continue inpatient treatment , support, and meds as below  -Amantadine100 mg BID for EPS prevention  -Abilify 10 mg BID for delusions, psychosis/mood - Lamictal 200 mg daily for mood -  Inderal 10 mg TID for Tachycardia 2. Treatment team will reassess in AM  Medical Decision Making Problem Points:  Established problem, improving (1) and Review of last therapy session (1) Data Points:  Review of medication regiment & side effects (2)  I certify that inpatient services furnished can reasonably be expected to improve the patient's condition.   Beau Fanny, FNP-BC 06/19/2014, 4:46 PM  Patient seen, evaluated and I agree with notes by Nurse  Practitioner. Thedore Mins, MD

## 2014-06-19 NOTE — Progress Notes (Signed)
D: Patient denies SI/HI and auditory and visual hallucinations. Patient has a suspicious mood and a flat affect. Patient's behavior remains bizarre. Patient appears catatonic at times and refuses to engage with staff throughout the day. Patient has minimal participation with groups.  A: Patient given emotional support from RN. Patient encouraged to come to staff with concerns and/or questions. Patient's medication routine continued. Patient's orders and plan of care reviewed.  R: Patient remains safe. Will continue to monitor patient q15 minutes for safety.

## 2014-06-19 NOTE — Progress Notes (Signed)
Adult Psychoeducational Group Note  Date:  06/19/2014 Time:  9:13 PM   Group Topic/Focus:  Wrap-Up Group:   The focus of this group is to help patients review their daily goal of treatment and discuss progress on daily workbooks.  Participation Level:  Did Not Attend  Participation Quality:  Did not attend  Affect:  Did not attend  Cognitive:  Did not attend  Insight: None  Engagement in Group:  Did not attend  Modes of Intervention:  Did not attend  Additional Comments:  Patient did not attend   Scot Dock 06/19/2014, 9:13 PM

## 2014-06-19 NOTE — [facility] Group Notes (Signed)
[facility] LCSW Group Therapy  06/19/2014 , 12:37 PM   Type of Therapy:  Group Therapy  Participation Level:  Did not attend  Summary of Progress/Problems: Today's group focused on the term Diagnosis.  Participants were asked to define the term, and then pronounce whether it is a negative, positive or neutral term.  Natalie [name] 06/19/2014 , 12:37 PM

## 2014-06-20 MED ORDER — LAMOTRIGINE 100 MG PO TABS
100.0000 mg | ORAL_TABLET | Freq: Every day | ORAL | Status: DC
  Administered 2014-06-21 – 2014-06-22 (×2): 100 mg via ORAL
  Filled 2014-06-20 (×4): qty 1

## 2014-06-20 MED ORDER — DIVALPROEX SODIUM 500 MG PO DR TAB
1000.0000 mg | DELAYED_RELEASE_TABLET | Freq: Every day | ORAL | Status: DC
  Administered 2014-06-20: 500 mg via ORAL
  Administered 2014-06-21 – 2014-06-24 (×4): 1000 mg via ORAL
  Filled 2014-06-20: qty 2
  Filled 2014-06-20: qty 28
  Filled 2014-06-20 (×6): qty 2

## 2014-06-20 MED ORDER — BENZTROPINE MESYLATE 0.5 MG PO TABS
0.5000 mg | ORAL_TABLET | Freq: Two times a day (BID) | ORAL | Status: DC
  Administered 2014-06-20 – 2014-06-25 (×11): 0.5 mg via ORAL
  Filled 2014-06-20 (×6): qty 1
  Filled 2014-06-20: qty 28
  Filled 2014-06-20 (×5): qty 1
  Filled 2014-06-20: qty 28
  Filled 2014-06-20 (×2): qty 1

## 2014-06-20 MED ORDER — HALOPERIDOL 5 MG PO TABS
5.0000 mg | ORAL_TABLET | Freq: Two times a day (BID) | ORAL | Status: DC
  Administered 2014-06-20 – 2014-06-25 (×11): 5 mg via ORAL
  Filled 2014-06-20 (×8): qty 1
  Filled 2014-06-20: qty 28
  Filled 2014-06-20 (×5): qty 1
  Filled 2014-06-20: qty 28

## 2014-06-20 MED ORDER — DIVALPROEX SODIUM 500 MG PO DR TAB: 500.0000 mg | DELAYED_RELEASE_TABLET | Freq: Two times a day (BID) | ORAL | Status: DC

## 2014-06-20 MED ORDER — TRAZODONE HCL 100 MG PO TABS
200.0000 mg | ORAL_TABLET | Freq: Every day | ORAL | Status: DC
  Administered 2014-06-20: 100 mg via ORAL
  Administered 2014-06-21: 200 mg via ORAL
  Administered 2014-06-21: 100 mg via ORAL
  Administered 2014-06-22 – 2014-06-23 (×2): 200 mg via ORAL
  Filled 2014-06-20 (×7): qty 2

## 2014-06-20 NOTE — Progress Notes (Signed)
Patient ID: Natalie [name], female   DOB: 11-18-1991, 23 y.o.   MRN: [id number] In room, standing in the middle of room, eyes closed. Encouraged to get into bed and rest, looked at bed. Began pacing slowing. Will continue to monitor. 15 min checks in place, safety mantained

## 2014-06-20 NOTE — Progress Notes (Signed)
Patient ID: Natalie [name], female   DOB: 1990/12/27, 23 y.o.   MRN: [id number] [facility] MD Progress Note  06/20/2014 10:06 AM Natalie [name]  MRN:  [id number] Subjective:   Patient states:" I want to talk to you ......''  Objective:  Patient was seen  and her chart was  reviewed. She is requesting to talk but not providing any information. Patient has not made any significant improvement. She appears flat, confused and disoriented.  Her behavior remains bizarre and thought process disorganized. She is guarded, evasive with decreased verbal output. She appears  Preoccupied and paranoid, paces the hallway, talk to herself as if responding to internal stimuli and has not been sleeping in the last 2 days. She is socially isolated with minimal interaction with peers, she has not been participating fully in the unit milieu. So far, patient has been compliant with her medications and denies adverse reactions.    Diagnosis:   DSM5: Total Time spent with patient: 20 minutes AXIS I: Bipolar 1 disorder, most recent episode mixed with psychosis  AXIS II: Cluster B Traits  AXIS III:  Past Medical History   Diagnosis  Date   .  Psychiatric diagnosis    .  Chronic bipolar disorder    .  OCD (obsessive compulsive disorder)    .  H/O suicide attempt  05-2013     OD   .  Anxiety    .  Depression     AXIS IV: economic problems, housing problems, occupational problems, other psychosocial or environmental problems, problems with access to health care services and problems with primary support group  AXIS V: 21-30 behavior considerably influenced by delusions or hallucinations OR serious impairment in judgment, communication OR inability to function in almost all areas  ADL's:  fair  Sleep: fair   Appetite:  Fair   Suicidal Ideation:  Denies Homicidal Ideation:  Denies AEB (as evidenced by):  Psychiatric Specialty Exam: Physical Exam  Psychiatric: Her affect is labile. Her speech is delayed  and tangential. She is aggressive, withdrawn and actively hallucinating. Thought content is paranoid. Cognition and memory are normal. She expresses impulsivity.    Review of Systems  Constitutional: Negative for fever, chills, weight loss, malaise/fatigue and diaphoresis.  HENT: Negative for congestion, ear discharge, ear pain, hearing loss, nosebleeds and tinnitus.   Eyes: Negative for blurred vision, double vision, photophobia, pain, discharge and redness.  Respiratory: Negative for cough, hemoptysis, sputum production, shortness of breath and stridor.   Cardiovascular: Negative for chest pain, palpitations, orthopnea, claudication and leg swelling.  Gastrointestinal: Negative for heartburn, nausea, vomiting, abdominal pain, diarrhea, constipation and blood in stool.  Genitourinary: Negative for dysuria, urgency, frequency and hematuria.  Musculoskeletal: Negative for back pain, joint pain, myalgias and neck pain.  Skin: Negative for itching and rash.  Neurological: Negative for dizziness, tingling, tremors, sensory change, speech change, focal weakness, seizures and headaches.  Endo/Heme/Allergies: Negative for environmental allergies. Does not bruise/bleed easily.  Psychiatric/Behavioral: Positive for depression and hallucinations. The patient is nervous/anxious and has insomnia.     Blood pressure 124/69, pulse 73, temperature 98.4 F (36.9 C), temperature source Oral, resp. rate 20, height 5\' 4"  (1.626 m), weight 84.369 kg (186 lb), last menstrual period 05/26/2014, SpO2 100.00%.Body mass index is 31.91 kg/(m^2).  General Appearance: fairly groomed   05/28/2014::  Good,still with a tendency to stare  Speech:  Blocked and Slow   Volume:  Decreased  Mood:  Anxious  Affect: blunted  Thought Process:  Disorganized-   Orientation:  NA, fully alert and attentive   Thought Content:   patient denies hallucinations, but at times presents as internally preoccupied , appears guarded    Suicidal Thoughts:  No- patient is not endorsing suicidal or homicidal idations  Homicidal Thoughts:  No  Memory: unable to assess, patient uncooperative   Judgement:  Poor  Insight:  Lacking  Psychomotor Activity:  Decreased  Concentration:  Fair  Recall:  Poor  Fund of Knowledge:Fair  Language: Fair  Akathisia:  No  Handed:  Right  AIMS (if indicated):     Assets:  Communication Skills Desire for Improvement Physical Health  Sleep:  Number of Hours: 1   Musculoskeletal: Strength & Muscle Tone: within normal limits Gait & Station: normal Patient leans: N/A  Current Medications: Current Facility-Administered Medications  Medication Dose Route Frequency Provider Last Rate Last Dose  . acetaminophen (TYLENOL) tablet 650 mg  650 mg Oral Q6H PRN Kerry Hough, PA-C      . alum & mag hydroxide-simeth (MAALOX/MYLANTA) 200-200-20 MG/5ML suspension 30 mL  30 mL Oral Q4H PRN Kerry Hough, PA-C      . benztropine (COGENTIN) tablet 0.5 mg  0.5 mg Oral BID Khale Nigh      . divalproex (DEPAKOTE) DR tablet 500 mg  500 mg Oral BID PC Nachmen Mansel      . docusate sodium (COLACE) capsule 100 mg  100 mg Oral Daily Evanee Lubrano   100 mg at 06/20/14 0726  . feeding supplement (ENSURE COMPLETE) (ENSURE COMPLETE) liquid 237 mL  237 mL Oral BID BM Jeoffrey Massed, RD   237 mL at 06/19/14 0759  . haloperidol (HALDOL) tablet 5 mg  5 mg Oral BID Craig Wisnewski      . hydrOXYzine (ATARAX/VISTARIL) tablet 25 mg  25 mg Oral Q6H PRN Kerry Hough, PA-C   25 mg at 06/14/14 1353  . [START ON 06/21/2014] lamoTRIgine (LAMICTAL) tablet 100 mg  100 mg Oral Daily Sofiya Ezelle      . LORazepam (ATIVAN) tablet 1 mg  1 mg Oral Q6H PRN Fransisca Kaufmann, NP   1 mg at 06/18/14 0228  . magnesium hydroxide (MILK OF MAGNESIA) suspension 30 mL  30 mL Oral Daily PRN Kerry Hough, PA-C   30 mL at 06/14/14 2048  . multivitamin with minerals tablet 1 tablet  1 tablet Oral Daily Jeoffrey Massed, RD   1 tablet  at 06/20/14 0800  . OLANZapine (ZYPREXA) injection 10 mg  10 mg Intramuscular Q8H PRN Kalynn Declercq       Or  . OLANZapine zydis (ZYPREXA) disintegrating tablet 10 mg  10 mg Oral Q8H PRN Maxi Rodas      . propranolol (INDERAL) tablet 20 mg  20 mg Oral BID Tonetta Napoles   20 mg at 06/20/14 0726  . traZODone (DESYREL) tablet 200 mg  200 mg Oral QHS Rolando Whitby        Lab Results:  No results found for this or any previous visit (from the past 48 hour(s)).  Physical Findings: AIMS: Facial and Oral Movements Muscles of Facial Expression: None, normal Lips and Perioral Area: None, normal Jaw: None, normal Tongue: None, normal,Extremity Movements Upper (arms, wrists, hands, fingers): None, normal Lower (legs, knees, ankles, toes): None, normal, Trunk Movements Neck, shoulders, hips: None, normal, Overall Severity Severity of abnormal movements (highest score from questions above): None, normal Incapacitation due to abnormal movements: None, normal Patient's awareness of abnormal movements (rate only  patient's report): No Awareness, Dental Status Current problems with teeth and/or dentures?: No Does patient usually wear dentures?: No  CIWA:    COWS:     Assessment: Some improvement compared to yesterday- less selectively mute, more verbal communication. Still blunted, guarded, (+) thought blocking. No medication side effects ( Abilify, Lamictal)   Treatment Plan Summary: Daily contact with patient to assess and evaluate symptoms and progress in treatment Medication management  Plan: 1. Continue inpatient treatment , support, and meds as below  - Discontinue Amantadine100 mg BID for EPS prevention  - Discontinue Abilify 10 mg BID for delusions, psychosis/mood -  Decrease Lamictal to 100 mg daily for mood -Initiate Depakote 1000mg  po qhs for mood -Initiate Haldol 5mg  po BID for delusions and psychosis -Initiate Cogentin 0.5mg  bid for EPS prevention. -Increase Trazodone  to 200mg  qhs for insomnia. -  Inderal 10 mg TID for Tachycardia 2. Treatment team will reassess in AM  Medical Decision Making Problem Points:  Established problem, improving (1) and Review of last therapy session (1) Data Points:  Review of medication regiment & side effects (2)  I certify that inpatient services furnished can reasonably be expected to improve the patient's condition.   , MD 06/20/2014, 10:06 AM

## 2014-06-20 NOTE — [facility] Group Notes (Signed)
[facility] LCSW Aftercare Discharge Planning Group Note   06/20/2014 1:01 PM  Participation Quality:  Did not attend    [place]

## 2014-06-20 NOTE — Progress Notes (Signed)
D: pt denies si/hi/avh and pain. Pt is suspicious and guarded. Pt is more compliant with medication, but still having the avoidant stare. Pt is also still having some bizarre behavior, standing in the doorway of her room, standing in the middle of her room for long periods of time. Pt is still refusing to engage with staff times, but has improved since admission A: scheduled medications given. Support and encouragement offered. q 15 min safety checks R: pt remains safe on unit. No signs of distress noted.

## 2014-06-20 NOTE — Progress Notes (Signed)
D: Patient denies SI/HI and auditory and visual hallucinations. The patient appears catatonic at times and continues to not engage with staff throughout the day. The patient appears paranoid and states that staff "are not helping her here." The patient prefers to isolate to her room.  A: Patient given emotional support from RN. Patient encouraged to come to staff with concerns and/or questions. Patient's medication routine continued. Patient's orders and plan of care reviewed.  R: Patient remains safe. Will continue to monitor patient q15 minutes for safety.

## 2014-06-20 NOTE — Progress Notes (Signed)
Adult Psychoeducational Group Note  Date:  06/20/2014 Time:  2:47 PM  Group Topic/Focus:  Personal Choices and Values:   The focus of this group is to help patients assess and explore the importance of values in their lives, how their values affect their decisions, how they express their values and what opposes their expression.  Participation Level:  Did Not Attend  Additional Comments:  Patient was informed group was starting and encouraged to come.  Merleen Milliner 06/20/2014, 2:47 PM

## 2014-06-20 NOTE — Progress Notes (Addendum)
D:Pt is denying any SI/HI/AVH. Pt appears to be catatonic at times. Pt remains hesitant to attend unit activities such as group this evening. Writer questioned pt about her conformability to go off the unit for activities such as meals. Pt is currently verbalizing that she is not ready to do so. However, pt does verbalize that she must become comfortable with doing so if she is to function outside of the hospital. Pt is currently denying any physical pain.  A: Writer administered scheduled medications to pt. Continued support and availability as needed was extended to this pt. Staff continue to monitor pt with q25min checks.  R: No adverse drug reactions noted. Pt receptive to treatment. Pt remains safe at this time.   Pt only took 100 mg of her Trazodone and 500 mg of her Depakote. Pt took one pill at a time and refused to take the additional tablets. Pt stood at window for about 10 mins. Pt was catatonic at the window. Pt presented with thought-blocking as well. Writer informed pt of the importance of taking her medications, especially Depakote for her disorder.

## 2014-06-20 NOTE — Progress Notes (Signed)
Adult Psychoeducational Group Note  Date:  06/20/2014 Time:  9:26 PM  Group Topic/Focus:  Wrap-Up Group:   The focus of this group is to help patients review their daily goal of treatment and discuss progress on daily workbooks.  Participation Level:  Did Not Attend  Participation Quality:  Did not attend  Affect:  Did not attend  Cognitive:  Did not attend  Insight: Lacking  Engagement in Group:  Did not attend  Modes of Intervention:  Did not attend  Additional Comments:  Patient did not attend   Scot Dock 06/20/2014, 9:26 PM

## 2014-06-21 NOTE — [facility] Group Notes (Signed)
[facility] LCSW Group Therapy  06/21/2014 , 3:17 PM   Type of Therapy:  Group Therapy  Participation Level:  Active  Participation Quality:  Attentive  Affect:  Appropriate  Cognitive:  Alert  Insight:  Improving  Engagement in Therapy:  Engaged  Modes of Intervention:  Discussion, Exploration and Socialization  Summary of Progress/Problems: Today's group focused on the term Diagnosis.  Participants were asked to define the term, and then pronounce whether it is a negative, positive or neutral term.  Attended initially, and was appropriate.  Smiled and engaged with others. Was not latent in her thoughts, speech and actions as she usually is.  Stated that she was diagnosed with Bipolar II first, but then changed to Bipolar I after her first hospitalization.  She said this made sense because her symptoms were more severe, including psychosis.  She got up once to leave, and stayed when I requested she not go.  After another 5 minutes she left and did not return.  Daryel Gerald B 06/21/2014 , 3:17 PM

## 2014-06-21 NOTE — Progress Notes (Signed)
Patient ID: Natalie [name], female   DOB: [birth year], 23 y.o.   MRN: [id number] [facility] MD Progress Note  06/21/2014 10:39 AM HARUNA ROHLFS  MRN:  [id number] Subjective:   Patient states:" I do not like any of the medications you put me on, I think they will poison my body.''  Objective:  Patient was seen and her chart was  reviewed. Patient remains delusional, paranoid, paces the hallway, talking to herself. She  has not made any significant improvement. Her behavior remains bizarre and thought process disorganized. She is guarded, evasive with decreased verbal output and stares into spaces for minutes as if preoccupied. She is socially isolated with minimal interaction with peers, she has not been participating fully in the unit milieu. Per the nursing staffs, she is  compliant with her medications and denies adverse reactions.    Diagnosis:   DSM5: Total Time spent with patient: 20 minutes AXIS I: Bipolar 1 disorder, most recent episode mixed with psychosis  AXIS II: Cluster B Traits  AXIS III:  Past Medical History   Diagnosis  Date   .  Psychiatric diagnosis    .  Chronic bipolar disorder    .  OCD (obsessive compulsive disorder)    .  H/O suicide attempt  05-2013     OD   .  Anxiety    .  Depression     AXIS IV: economic problems, housing problems, occupational problems, other psychosocial or environmental problems, problems with access to health care services and problems with primary support group  AXIS V: 21-30 behavior considerably influenced by delusions or hallucinations OR serious impairment in judgment, communication OR inability to function in almost all areas  ADL's:  fair  Sleep: fair   Appetite:  Fair   Suicidal Ideation:  Denies Homicidal Ideation:  Denies AEB (as evidenced by):  Psychiatric Specialty Exam: Physical Exam  Psychiatric: Her affect is labile. Her speech is delayed and tangential. She is aggressive, withdrawn and actively hallucinating.  Thought content is paranoid. Cognition and memory are normal. She expresses impulsivity.    Review of Systems  Constitutional: Negative for fever, chills, weight loss, malaise/fatigue and diaphoresis.  HENT: Negative for congestion, ear discharge, ear pain, hearing loss, nosebleeds and tinnitus.   Eyes: Negative for blurred vision, double vision, photophobia, pain, discharge and redness.  Respiratory: Negative for cough, hemoptysis, sputum production, shortness of breath and stridor.   Cardiovascular: Negative for chest pain, palpitations, orthopnea, claudication and leg swelling.  Gastrointestinal: Negative for heartburn, nausea, vomiting, abdominal pain, diarrhea, constipation and blood in stool.  Genitourinary: Negative for dysuria, urgency, frequency and hematuria.  Musculoskeletal: Negative for back pain, joint pain, myalgias and neck pain.  Skin: Negative for itching and rash.  Neurological: Negative for dizziness, tingling, tremors, sensory change, speech change, focal weakness, seizures and headaches.  Endo/Heme/Allergies: Negative for environmental allergies. Does not bruise/bleed easily.  Psychiatric/Behavioral: Positive for depression and hallucinations. The patient is nervous/anxious and has insomnia.     Blood pressure 136/87, pulse 79, temperature 97.4 F (36.3 C), temperature source Oral, resp. rate 20, height 5\' 4"  (1.626 m), weight 84.369 kg (186 lb), last menstrual period 05/26/2014, SpO2 100.00%.Body mass index is 31.91 kg/(m^2).  General Appearance: fairly groomed   05/28/2014::  Good,still with a tendency to stare  Speech:  Blocked and Slow   Volume:  Decreased  Mood:  Anxious  Affect: blunted  Thought Process:  Disorganized-   Orientation:  NA, fully alert and attentive  Thought Content:   patient denies hallucinations, but at times presents as internally preoccupied , appears guarded   Suicidal Thoughts:  No- patient is not endorsing suicidal or homicidal idations   Homicidal Thoughts:  No  Memory: unable to assess, patient uncooperative   Judgement:  Poor  Insight:  Lacking  Psychomotor Activity:  Decreased  Concentration:  Fair  Recall:  Poor  Fund of Knowledge:Fair  Language: Fair  Akathisia:  No  Handed:  Right  AIMS (if indicated):     Assets:  Communication Skills Desire for Improvement Physical Health  Sleep:  Number of Hours: 1.25   Musculoskeletal: Strength & Muscle Tone: within normal limits Gait & Station: normal Patient leans: N/A  Current Medications: Current Facility-Administered Medications  Medication Dose Route Frequency Provider Last Rate Last Dose  . acetaminophen (TYLENOL) tablet 650 mg  650 mg Oral Q6H PRN Kerry Hough, PA-C      . alum & mag hydroxide-simeth (MAALOX/MYLANTA) 200-200-20 MG/5ML suspension 30 mL  30 mL Oral Q4H PRN Kerry Hough, PA-C      . benztropine (COGENTIN) tablet 0.5 mg  0.5 mg Oral BID Rayhan Groleau   0.5 mg at 06/21/14 0759  . divalproex (DEPAKOTE) DR tablet 1,000 mg  1,000 mg Oral QHS Elmar Antigua   500 mg at 06/20/14 2120  . docusate sodium (COLACE) capsule 100 mg  100 mg Oral Daily Annjanette Wertenberger   100 mg at 06/21/14 0759  . feeding supplement (ENSURE COMPLETE) (ENSURE COMPLETE) liquid 237 mL  237 mL Oral BID BM Jeoffrey Massed, RD   237 mL at 06/19/14 0759  . haloperidol (HALDOL) tablet 5 mg  5 mg Oral BID Oliwia Berzins   5 mg at 06/21/14 0759  . hydrOXYzine (ATARAX/VISTARIL) tablet 25 mg  25 mg Oral Q6H PRN Kerry Hough, PA-C   25 mg at 06/14/14 1353  . lamoTRIgine (LAMICTAL) tablet 100 mg  100 mg Oral Daily Shamaria Kavan   100 mg at 06/21/14 0759  . LORazepam (ATIVAN) tablet 1 mg  1 mg Oral Q6H PRN Fransisca Kaufmann, NP   1 mg at 06/18/14 0228  . magnesium hydroxide (MILK OF MAGNESIA) suspension 30 mL  30 mL Oral Daily PRN Kerry Hough, PA-C   30 mL at 06/14/14 2048  . multivitamin with minerals tablet 1 tablet  1 tablet Oral Daily Jeoffrey Massed, RD   1 tablet at 06/21/14  0759  . OLANZapine (ZYPREXA) injection 10 mg  10 mg Intramuscular Q8H PRN Nahla Lukin       Or  . OLANZapine zydis (ZYPREXA) disintegrating tablet 10 mg  10 mg Oral Q8H PRN Davit Vassar      . propranolol (INDERAL) tablet 20 mg  20 mg Oral BID Seila Liston   20 mg at 06/21/14 0759  . traZODone (DESYREL) tablet 200 mg  200 mg Oral QHS Zoella Roberti   100 mg at 06/21/14 0210    Lab Results:  No results found for this or any previous visit (from the past 48 hour(s)).  Physical Findings: AIMS: Facial and Oral Movements Muscles of Facial Expression: None, normal Lips and Perioral Area: None, normal Jaw: None, normal Tongue: None, normal,Extremity Movements Upper (arms, wrists, hands, fingers): None, normal Lower (legs, knees, ankles, toes): None, normal, Trunk Movements Neck, shoulders, hips: None, normal, Overall Severity Severity of abnormal movements (highest score from questions above): None, normal Incapacitation due to abnormal movements: None, normal Patient's awareness of abnormal movements (rate only patient's  report): No Awareness, Dental Status Current problems with teeth and/or dentures?: No Does patient usually wear dentures?: No  CIWA:    COWS:     Assessment: Some improvement compared to yesterday- less selectively mute, more verbal communication. Still blunted, guarded, (+) thought blocking. No medication side effects ( Abilify, Lamictal)   Treatment Plan Summary: Daily contact with patient to assess and evaluate symptoms and progress in treatment Medication management  Plan: 1. Continue inpatient treatment , support, and meds as below  -Continue Depakote 1000mg  po qhs for mood -Continue Haldol 5mg  po BID for delusions and psychosis -Continue Cogentin 0.5mg  bid for EPS prevention. -ContinueTrazodone to 200mg  qhs for insomnia. - Change  Inderal to 20 mg BID for Tachycardia 2. Treatment team will reassess in AM  Medical Decision Making Problem Points:   Established problem, improving (1) and Review of last therapy session (1) Data Points:  Review of medication regiment & side effects (2)  I certify that inpatient services furnished can reasonably be expected to improve the patient's condition.   Thedore Mins, MD 06/21/2014, 10:39 AM

## 2014-06-21 NOTE — Progress Notes (Signed)
Patient ID: JORDANA DUGUE, female   DOB: Dec 16, 1990, 22 y.o.   MRN: [id number] D: Patient continues to isolate and be nonverbal.  She is resistant to taking her medications and takes a lengthy amount of time with them.  She will take a few, then smile and stare at her nurse.  She refuses to answer questions about SI/HI/AVH.  However, when she was given an ensure she asked numerous questions about why she was ordered it.  She walks to the day room occasionally, but mostly stays in her room.  She has minimal interaction with staff and peers.  A: Continue to monitor medication management and MD orders.  Safety checks completed every 15 minutes per protocol.  R: patient remains isolative with elective mutism.

## 2014-06-21 NOTE — [facility] Group Notes (Signed)
[facility] Group Notes:  (Nursing/MHT/Case Management/Adjunct)  Date:  06/21/2014  Time: 0900 am Type of Therapy:  Psychoeducational Skills  Participation Level:  None  Participation Quality:  Inattentive  Affect:  Not Congruent  Cognitive:  Lacking  Insight:  Lacking  Engagement in Group:  Lacking  Modes of Intervention:  Support  Summary of Progress/Problems:  Cranford Mon 06/21/2014, 2:03 PM

## 2014-06-21 NOTE — Progress Notes (Signed)
D: Pt presents with flat affect, depressed mood.  Pt is isolative, sits in day room but does not interact with peers.  Pt denies SI/HI.  Pt denies AH/VH.  Denies pain.  Pt reports she hasn't been leaving the unit for meals.  Reports her day was "so-so, I'm still trying to get adjusted to going to the groups.  I tried to go to more today than I have been."   A:  Encouraged pt to participate in groups.  Supported pt for trying to go to more groups today.  Encouraged pt to report any concerns to staff.  Safety maintained.  Medications administered per order.   R:  Pt reports she will notify staff of any concerns and if she thinks of harming self or others.  Will continue to monitor for safety.

## 2014-06-22 MED ORDER — LAMOTRIGINE 25 MG PO TABS
50.0000 mg | ORAL_TABLET | Freq: Every day | ORAL | Status: DC
  Administered 2014-06-23 – 2014-06-25 (×3): 50 mg via ORAL
  Filled 2014-06-22 (×5): qty 2

## 2014-06-22 NOTE — [facility] Group Notes (Signed)
[facility] LCSW Group Therapy  06/22/2014  1:05 PM  Type of Therapy:  Group therapy  Participation Level:  Active  Participation Quality:  Attentive  Affect:  Flat  Cognitive:  Oriented  Insight:  Limited  Engagement in Therapy:  Limited  Modes of Intervention:  Discussion, Socialization  Summary of Progress/Problems:  Chaplain was here to lead a group on themes of hope and courage.  Natalie [name] talked more than she has in any group.  She started by saying that she was supposed to be on a different hall. After listening to her for awhile, I challenged her that she had not done what she needed to to prove she could be transferred.  Not only did she accept that, but also admitted that she is stubborn, and gets into power struggles with others.  This happens at home, and she is really not looking forward to going back there.  I suggested she check out the mental health association to get her out of the house during the day.  She expressed interest in that.  She left group, but came back.  Natalie [name] 06/22/2014 1:54 PM

## 2014-06-22 NOTE — Progress Notes (Signed)
Patient ID: Natalie [name], female   DOB: 25-Oct-1991, 23 y.o.   MRN: [id number] [facility] MD Progress Note  06/22/2014 10:21 AM Natalie [name]  MRN:  [id number] Subjective:   Patient states:" I have problem taking medications, I think they are poisons my body.''   Objective:  Patient was seen and her chart was  reviewed. Patient continues to express delusional thinking-she has refused to use the bathroom in her room for fear of something bad happening to her. She is self isolated with no interaction with her peers and often paces the hallway and stares into spaces for minutes as if internally preoccupied. Patient verbal output is improving but her behavior remains bizarre and thought process disorganized. She remains  Guarded and  Evasive. Per the nursing staffs, she is  compliant with her medications and denies adverse reactions.    Diagnosis:   DSM5: Total Time spent with patient: 20 minutes AXIS I: Bipolar 1 disorder, most recent episode mixed with psychosis  AXIS II: Cluster B Traits  AXIS III:  Past Medical History   Diagnosis  Date   .  Psychiatric diagnosis    .  Chronic bipolar disorder    .  OCD (obsessive compulsive disorder)    .  H/O suicide attempt  05-2013     OD   .  Anxiety    .  Depression     AXIS IV: economic problems, housing problems, occupational problems, other psychosocial or environmental problems, problems with access to health care services and problems with primary support group  AXIS V: 21-30 behavior considerably influenced by delusions or hallucinations OR serious impairment in judgment, communication OR inability to function in almost all areas  ADL's:  fair  Sleep: fair   Appetite:  Fair   Suicidal Ideation:  Denies Homicidal Ideation:  Denies AEB (as evidenced by):  Psychiatric Specialty Exam: Physical Exam  Psychiatric: Her affect is labile. Her speech is delayed and tangential. She is aggressive, withdrawn and actively hallucinating.  Thought content is paranoid. Cognition and memory are normal. She expresses impulsivity.    Review of Systems  Constitutional: Negative for fever, chills, weight loss, malaise/fatigue and diaphoresis.  HENT: Negative for congestion, ear discharge, ear pain, hearing loss, nosebleeds and tinnitus.   Eyes: Negative for blurred vision, double vision, photophobia, pain, discharge and redness.  Respiratory: Negative for cough, hemoptysis, sputum production, shortness of breath and stridor.   Cardiovascular: Negative for chest pain, palpitations, orthopnea, claudication and leg swelling.  Gastrointestinal: Negative for heartburn, nausea, vomiting, abdominal pain, diarrhea, constipation and blood in stool.  Genitourinary: Negative for dysuria, urgency, frequency and hematuria.  Musculoskeletal: Negative for back pain, joint pain, myalgias and neck pain.  Skin: Negative for itching and rash.  Neurological: Negative for dizziness, tingling, tremors, sensory change, speech change, focal weakness, seizures and headaches.  Endo/Heme/Allergies: Negative for environmental allergies. Does not bruise/bleed easily.  Psychiatric/Behavioral: Positive for depression and hallucinations. The patient is nervous/anxious and has insomnia.     Blood pressure 108/60, pulse 76, temperature 97.3 F (36.3 C), temperature source Oral, resp. rate 16, height 5\' 4"  (1.626 m), weight 84.369 kg (186 lb), last menstrual period 05/26/2014, SpO2 100.00%.Body mass index is 31.91 kg/(m^2).  General Appearance: fairly groomed   Patent attorney::  Good,still with a tendency to stare  Speech:  Blocked and Slow   Volume:  Decreased  Mood:  Anxious  Affect: blunted  Thought Process:  Disorganized-   Orientation:  NA, fully alert and attentive  Thought Content:   patient denies hallucinations, but at times presents as internally preoccupied , appears guarded   Suicidal Thoughts:  No- patient is not endorsing suicidal or homicidal idations   Homicidal Thoughts:  No  Memory: unable to assess, patient uncooperative   Judgement:  Poor  Insight:  Lacking  Psychomotor Activity:  Decreased  Concentration:  Fair  Recall:  Poor  Fund of Knowledge:Fair  Language: Fair  Akathisia:  No  Handed:  Right  AIMS (if indicated):     Assets:  Communication Skills Desire for Improvement Physical Health  Sleep:  Number of Hours: 3.5   Musculoskeletal: Strength & Muscle Tone: within normal limits Gait & Station: normal Patient leans: N/A  Current Medications: Current Facility-Administered Medications  Medication Dose Route Frequency Provider Last Rate Last Dose  . acetaminophen (TYLENOL) tablet 650 mg  650 mg Oral Q6H PRN Kerry Hough, PA-C      . alum & mag hydroxide-simeth (MAALOX/MYLANTA) 200-200-20 MG/5ML suspension 30 mL  30 mL Oral Q4H PRN Kerry Hough, PA-C      . benztropine (COGENTIN) tablet 0.5 mg  0.5 mg Oral BID Raylene Carmickle   0.5 mg at 06/22/14 0754  . divalproex (DEPAKOTE) DR tablet 1,000 mg  1,000 mg Oral QHS Kura Bethards   1,000 mg at 06/21/14 2104  . docusate sodium (COLACE) capsule 100 mg  100 mg Oral Daily Harryette Shuart   100 mg at 06/22/14 0754  . feeding supplement (ENSURE COMPLETE) (ENSURE COMPLETE) liquid 237 mL  237 mL Oral BID BM Jeoffrey Massed, RD   237 mL at 06/22/14 1016  . haloperidol (HALDOL) tablet 5 mg  5 mg Oral BID Reverie Vaquera   5 mg at 06/22/14 0754  . hydrOXYzine (ATARAX/VISTARIL) tablet 25 mg  25 mg Oral Q6H PRN Kerry Hough, PA-C   25 mg at 06/14/14 1353  . [START ON 06/23/2014] lamoTRIgine (LAMICTAL) tablet 50 mg  50 mg Oral Daily Mykah Shin      . LORazepam (ATIVAN) tablet 1 mg  1 mg Oral Q6H PRN Fransisca Kaufmann, NP   1 mg at 06/18/14 0228  . magnesium hydroxide (MILK OF MAGNESIA) suspension 30 mL  30 mL Oral Daily PRN Kerry Hough, PA-C   30 mL at 06/14/14 2048  . multivitamin with minerals tablet 1 tablet  1 tablet Oral Daily Jeoffrey Massed, RD   1 tablet at 06/22/14 0754   . OLANZapine (ZYPREXA) injection 10 mg  10 mg Intramuscular Q8H PRN Marquese Burkland       Or  . OLANZapine zydis (ZYPREXA) disintegrating tablet 10 mg  10 mg Oral Q8H PRN Akeisha Lagerquist      . propranolol (INDERAL) tablet 20 mg  20 mg Oral BID Merry Pond   20 mg at 06/22/14 0754  . traZODone (DESYREL) tablet 200 mg  200 mg Oral QHS Forever Arechiga   200 mg at 06/21/14 2104    Lab Results:  No results found for this or any previous visit (from the past 48 hour(s)).  Physical Findings: AIMS: Facial and Oral Movements Muscles of Facial Expression: None, normal Lips and Perioral Area: None, normal Jaw: None, normal Tongue: None, normal,Extremity Movements Upper (arms, wrists, hands, fingers): None, normal Lower (legs, knees, ankles, toes): None, normal, Trunk Movements Neck, shoulders, hips: None, normal, Overall Severity Severity of abnormal movements (highest score from questions above): None, normal Incapacitation due to abnormal movements: None, normal Patient's awareness of abnormal movements (rate only patient's  report): No Awareness, Dental Status Current problems with teeth and/or dentures?: No Does patient usually wear dentures?: No  CIWA:    COWS:     Assessment: Some improvement compared to yesterday- less selectively mute, more verbal communication. Still blunted, guarded, (+) thought blocking. No medication side effects ( Abilify, Lamictal)   Treatment Plan Summary: Daily contact with patient to assess and evaluate symptoms and progress in treatment Medication management  Plan: 1. Continue inpatient treatment , support, and meds as below  -Continue Depakote 1000mg  po qhs for mood -Continue Haldol 5mg  po BID for delusions and psychosis -Continue Cogentin 0.5mg  bid for EPS prevention. -ContinueTrazodone to 200mg  qhs for insomnia. - Change  Inderal to 20 mg BID for Tachycardia -Valproic acid level on 06/25/14   Medical Decision Making Problem Points:   Established problem, improving (1) and Review of last therapy session (1) Data Points:  Review of medication regiment & side effects (2)  I certify that inpatient services furnished can reasonably be expected to improve the patient's condition.   , MD 06/22/2014, 10:21 AM

## 2014-06-22 NOTE — Tx Team (Signed)
  Interdisciplinary Treatment Plan Update   Date Reviewed:  06/22/2014  Time Reviewed:  8:08 AM  Progress in Treatment:   Attending groups: Yes Participating in groups: Yes Taking medication as prescribed: Yes  Tolerating medication: Yes Family/Significant other contact made: Yes  Patient understands diagnosis: Yes  Discussing patient identified problems/goals with staff: Yes Medical problems stabilized or resolved: Yes Denies suicidal/homicidal ideation: Yes Patient has not harmed self or others: Yes  For review of initial/current patient goals, please see plan of care.  Estimated Length of Stay:  4-5 days  Reason for Continuation of Hospitalization: Medication stabilization Other; describe RIS, bizarre thought and behavior  New Problems/Goals identified:  N/A  Discharge Plan or Barriers:   return home, follow up outpt  Additional Comments: " I do not like any of the medications you put me on, I think they will poison my body.''  Patient remains delusional, paranoid, paces the hallway, talking to herself. She has not made any significant improvement. Her behavior remains bizarre and thought process disorganized. She is guarded, evasive with decreased verbal output and stares into spaces for minutes as if preoccupied. She is socially isolated with minimal interaction with peers, she has not been participating fully in the unit milieu.  Meds changed this week from Abilify and Tegretol to Depakote and Haldol.  Trazodone also increased.   Attendees:  Signature: Thedore Mins, MD 06/22/2014 8:08 AM   Signature: Richelle Ito, LCSW 06/22/2014 8:08 AM  Signature: Fransisca Kaufmann, NP 06/22/2014 8:08 AM  Signature: Joslyn Devon, RN 06/22/2014 8:08 AM  Signature: Liborio Nixon, RN 06/22/2014 8:08 AM  Signature:  06/22/2014 8:08 AM  Signature:   06/22/2014 8:08 AM  Signature:    Signature:    Signature:    Signature:    Signature:    Signature:      Scribe for Treatment Team:   Richelle Ito, LCSW  06/22/2014 8:08 AM

## 2014-06-22 NOTE — Progress Notes (Signed)
Adult Psychoeducational Group Note  Date:  06/22/2014 Time:  9:37 PM  Group Topic/Focus:  Wrap-Up Group:   The focus of this group is to help patients review their daily goal of treatment and discuss progress on daily workbooks.  Participation Level:  Active  Participation Quality:  Appropriate and Attentive  Affect:  Appropriate  Cognitive:  Alert and Appropriate  Insight: Appropriate and Good  Engagement in Group:  Limited  Modes of Intervention:  Limit-setting  Additional Comments:  Pt was in group but did not talk much while she was in group... Pt just sat there and looked at everyone else while they where talking   Jhonny Calixto R 06/22/2014, 9:37 PM

## 2014-06-22 NOTE — Plan of Care (Signed)
Problem: Ineffective individual coping Goal: STG: Patient will remain free from self harm Outcome: Progressing No self harm behaviors this shift.    Problem: Alteration in mood; excessive anxiety as evidenced by: Goal: STG-Pt will report an absence of self-harm thoughts/actions (Patient will report an absence of self-harm thoughts or actions)  Outcome: Progressing Pt denies thoughts of self harm and agrees to notify staff if thinking of harming self.    Problem: Alteration in mood Goal: LTG-Patient reports reduction in suicidal thoughts (Patient reports reduction in suicidal thoughts and is able to verbalize a safety plan for whenever patient is feeling suicidal)  Outcome: Progressing Pt denies SI this shift.   Goal: STG-Patient reports thoughts of self-harm to staff Outcome: Progressing Pt agrees to notify staff if thinking of harming self.    Problem: Alteration in thought process Goal: LTG-Patient has not harmed self or others in at least 2 days Outcome: Completed/Met Date Met:  06/22/14 Pt has not harmed self or others in the past 2 days.   Goal: STG-Patient is able to follow short directions Outcome: Progressing Pt was able to follow directions this shift.

## 2014-06-22 NOTE — Progress Notes (Signed)
Patient ID: Natalie [name], female   DOB: Sep 15, 1991, 23 y.o.   MRN: [id number] D.  Patient presents with depressed mood , affect flat. Natalie [name] continues to have thought blocking, and appears very withdrawn and suspicious. She remains very guarded and is not forthcoming of thoughts, but she did complete her self inventory today. She rates her depression at a 8/10 on depression scale, 1 being least depressed 10 being worst. She also continues to appear as if she is responding to internal stimuli. She remains very paranoid, writing a note to Clinical research associate stating '' I wanted to know why I can't have the right type of bathroom to use, I don't feel comfortable using this bathroom. '' Staff observed that bathroom in working order and patient assured it is safe. A. Medications given as ordered. Support and encouragement provided. Discussed above information with Dr. Jannifer Franklin. R. Patient is safe. Will continue to monitor q 15 minutes for safety.

## 2014-06-23 NOTE — Progress Notes (Signed)
Patient ID: Natalie [name], female   DOB: 1990-12-17, 23 y.o.   MRN: [id number] D: Patient in dayroom talking to another patient. Pt is suspicious towards staff and forward little. Pt reports to writer she took a shower and wearing her favorite shoes. Pt denies auditory and visual hallucination. Pt attended evening wrap up group and engaged in discussion. Pt denies any needs or concerns. Cooperative with assessment. No acute distressed noted at this time.   A: Met with pt 1:1. Medications administered as prescribed. Writer encouraged pt to discuss feelings. Pt encouraged to come to staff with any questions or concerns.   R: Patient is safe on the unit. She is complaint with medications and denies any adverse reaction. Continue current POC.

## 2014-06-23 NOTE — Progress Notes (Signed)
Patient ID: Natalie [name], female   DOB: 29-Nov-1991, 23 y.o.   MRN: [id number] D. Patient presents with depressed mood , affect flat again today. [name] continues to have thought blocking, and appears very withdrawn and suspicious. She continues to have disorganized thoughts, and remains suspicious. She remains very guarded and is not forthcoming of thoughts, but she did complete her self inventory today. She also continues to state '' My room is not the right room. I want a different bathroom to use. '' Staff observed that bathroom in working order and patient assured it is safe again today. A. Medications given as ordered. Support and encouragement provided. Discussed above information with Dr. Jannifer Franklin. R. Patient is safe. Will continue to monitor q 15 minutes for safety.

## 2014-06-23 NOTE — Progress Notes (Signed)
Patient ID: Natalie [name], female   DOB: 1991/08/10, 23 y.o.   MRN: [id number] [facility] MD Progress Note  06/23/2014 10:51 AM Natalie [name]  MRN:  [id number] Subjective:   Patient states:" I have problem taking medications, I think they are poisons my body.''   Objective:  Patient was seen and her chart was  reviewed. Patient continues to express delusional thinking-concerned that she wants her papers so she can live independently from her mother. Blunt affect and still somewehat isolated with no interaction with her peers and often paces the hallway and stares into spaces for minutes as if internally preoccupied. Patient verbal output is improving but her behavior remains bizarre and thought process disorganized. She remains  Guarded and  Evasive. Per the nursing staffs, she is  compliant with her medications and denies adverse reactions.    Diagnosis:   DSM5: Total Time spent with patient: 20 minutes AXIS I: Bipolar 1 disorder, most recent episode mixed with psychosis  AXIS II: Cluster B Traits  AXIS III:  Past Medical History   Diagnosis  Date   .  Psychiatric diagnosis    .  Chronic bipolar disorder    .  OCD (obsessive compulsive disorder)    .  H/O suicide attempt  05-2013     OD   .  Anxiety    .  Depression     AXIS IV: economic problems, housing problems, occupational problems, other psychosocial or environmental problems, problems with access to health care services and problems with primary support group  AXIS V: 21-30 behavior considerably influenced by delusions or hallucinations OR serious impairment in judgment, communication OR inability to function in almost all areas  ADL's:  fair  Sleep: fair   Appetite:  Fair   Suicidal Ideation:  Denies Homicidal Ideation:  Denies AEB (as evidenced by):  Psychiatric Specialty Exam: Physical Exam  Psychiatric: Her affect is labile. Her speech is delayed and tangential. She is aggressive, withdrawn and actively  hallucinating. Thought content is paranoid. Cognition and memory are normal. She expresses impulsivity.    Review of Systems  Constitutional: Negative for fever, chills, weight loss, malaise/fatigue and diaphoresis.  HENT: Negative for congestion, ear discharge, ear pain, hearing loss, nosebleeds and tinnitus.   Eyes: Negative for blurred vision, double vision, photophobia, pain, discharge and redness.  Respiratory: Negative for cough, hemoptysis, sputum production, shortness of breath and stridor.   Cardiovascular: Negative for chest pain, palpitations, orthopnea, claudication and leg swelling.  Gastrointestinal: Negative for heartburn, nausea, vomiting, abdominal pain, diarrhea, constipation and blood in stool.  Genitourinary: Negative for dysuria, urgency, frequency and hematuria.  Musculoskeletal: Negative for back pain, joint pain, myalgias and neck pain.  Skin: Negative for itching and rash.  Neurological: Negative for dizziness, tingling, tremors, sensory change, speech change, focal weakness, seizures and headaches.  Endo/Heme/Allergies: Negative for environmental allergies. Does not bruise/bleed easily.  Psychiatric/Behavioral: Positive for depression and hallucinations. The patient is nervous/anxious and has insomnia.     Blood pressure 116/63, pulse 94, temperature 98.5 F (36.9 C), temperature source Oral, resp. rate 16, height 5\' 4"  (1.626 m), weight 186 lb (84.369 kg), last menstrual period 05/26/2014, SpO2 100.00%.Body mass index is 31.91 kg/(m^2).  General Appearance: fairly groomed   05/28/2014::  Good,still with a tendency to stare  Speech:  Blocked and Slow   Volume:  Decreased  Mood:  Anxious  Affect: blunted  Thought Process:  Disorganized-   Orientation:  NA, fully alert and attentive   Thought  Content:   patient denies hallucinations, but at times presents as internally preoccupied , appears guarded   Suicidal Thoughts:  No- patient is not endorsing suicidal or  homicidal idations  Homicidal Thoughts:  No  Memory: unable to assess, patient uncooperative   Judgement:  Poor  Insight:  Lacking  Psychomotor Activity:  Decreased  Concentration:  Fair  Recall:  Poor  Fund of Knowledge:Fair  Language: Fair  Akathisia:  No  Handed:  Right  AIMS (if indicated):     Assets:  Communication Skills Desire for Improvement Physical Health  Sleep:  Number of Hours: 3.5   Musculoskeletal: Strength & Muscle Tone: within normal limits Gait & Station: normal Patient leans: N/A  Current Medications: Current Facility-Administered Medications  Medication Dose Route Frequency Provider Last Rate Last Dose  . acetaminophen (TYLENOL) tablet 650 mg  650 mg Oral Q6H PRN Kerry Hough, PA-C      . alum & mag hydroxide-simeth (MAALOX/MYLANTA) 200-200-20 MG/5ML suspension 30 mL  30 mL Oral Q4H PRN Kerry Hough, PA-C      . benztropine (COGENTIN) tablet 0.5 mg  0.5 mg Oral BID Mojeed Akintayo   0.5 mg at 06/23/14 0747  . divalproex (DEPAKOTE) DR tablet 1,000 mg  1,000 mg Oral QHS Mojeed Akintayo   1,000 mg at 06/22/14 2141  . docusate sodium (COLACE) capsule 100 mg  100 mg Oral Daily Mojeed Akintayo   100 mg at 06/23/14 0747  . feeding supplement (ENSURE COMPLETE) (ENSURE COMPLETE) liquid 237 mL  237 mL Oral BID BM Jeoffrey Massed, RD   237 mL at 06/22/14 1016  . haloperidol (HALDOL) tablet 5 mg  5 mg Oral BID Mojeed Akintayo   5 mg at 06/23/14 0747  . hydrOXYzine (ATARAX/VISTARIL) tablet 25 mg  25 mg Oral Q6H PRN Kerry Hough, PA-C   25 mg at 06/14/14 1353  . lamoTRIgine (LAMICTAL) tablet 50 mg  50 mg Oral Daily Mojeed Akintayo   50 mg at 06/23/14 0748  . LORazepam (ATIVAN) tablet 1 mg  1 mg Oral Q6H PRN Fransisca Kaufmann, NP   1 mg at 06/23/14 0748  . magnesium hydroxide (MILK OF MAGNESIA) suspension 30 mL  30 mL Oral Daily PRN Kerry Hough, PA-C   30 mL at 06/14/14 2048  . multivitamin with minerals tablet 1 tablet  1 tablet Oral Daily Jeoffrey Massed, RD   1  tablet at 06/23/14 0747  . OLANZapine (ZYPREXA) injection 10 mg  10 mg Intramuscular Q8H PRN Mojeed Akintayo       Or  . OLANZapine zydis (ZYPREXA) disintegrating tablet 10 mg  10 mg Oral Q8H PRN Mojeed Akintayo   10 mg at 06/22/14 1629  . propranolol (INDERAL) tablet 20 mg  20 mg Oral BID Mojeed Akintayo   20 mg at 06/23/14 0748  . traZODone (DESYREL) tablet 200 mg  200 mg Oral QHS Mojeed Akintayo   200 mg at 06/22/14 2141    Lab Results:  No results found for this or any previous visit (from the past 48 hour(s)).  Physical Findings: AIMS: Facial and Oral Movements Muscles of Facial Expression: None, normal Lips and Perioral Area: None, normal Jaw: None, normal Tongue: None, normal,Extremity Movements Upper (arms, wrists, hands, fingers): None, normal Lower (legs, knees, ankles, toes): None, normal, Trunk Movements Neck, shoulders, hips: None, normal, Overall Severity Severity of abnormal movements (highest score from questions above): None, normal Incapacitation due to abnormal movements: None, normal Patient's awareness of abnormal movements (rate  only patient's report): No Awareness, Dental Status Current problems with teeth and/or dentures?: No Does patient usually wear dentures?: No  CIWA:    COWS:     Assessment: Some improvement compared to yesterday- less selectively mute, more verbal communication. Still blunted, guarded, (+) thought blocking. No medication side effects ( Abilify, Lamictal)   Treatment Plan Summary: Daily contact with patient to assess and evaluate symptoms and progress in treatment Medication management  Plan: 1. Continue inpatient treatment , support, and meds as below  -Continue Depakote 1000mg  po qhs for mood -Continue Haldol 5mg  po BID for delusions and psychosis -Continue Cogentin 0.5mg  bid for EPS prevention. -ContinueTrazodone to 200mg  qhs for insomnia. - Change  Inderal to 20 mg BID for Tachycardia -Valproic acid level on  06/25/14   Medical Decision Making Problem Points:  Established problem, improving (1) and Review of last therapy session (1) Data Points:  Review of medication regiment & side effects (2)  I certify that inpatient services furnished can reasonably be expected to improve the patient's condition.   Thresa Ross, MD 06/23/2014, 10:51 AM

## 2014-06-23 NOTE — Progress Notes (Signed)
The focus of this group is to help patients review their daily goal of treatment and discuss progress on daily workbooks. Pt attended the evening group session but responded minimally to discussion prompts from the Writer. Pt shared that today was a good day on the unit, the highlight of which was feeling as though she was being more "sociable." The Writer encouraged the Pt for this and cited her evening attending and speaking up in group as proof of her being more sociable. Pt also mentioned having a good visit with her husband after dinner. [name] only additional request from Nursing Staff this evening was to receive towels and do her laundry, both of which were taken care of following group. [name] affect was very flat and she did not make eye contact with the Writer even when speaking/being spoken to.

## 2014-06-23 NOTE — [facility] Group Notes (Signed)
[facility] Group Notes: (Clinical Social Work)   06/23/2014      Type of Therapy:  Group Therapy   Participation Level:  Did Not Attend    Ambrose Mantle, LCSW 06/23/2014, 11:53 AM

## 2014-06-24 MED ORDER — TRAZODONE HCL 100 MG PO TABS
100.0000 mg | ORAL_TABLET | Freq: Every day | ORAL | Status: DC
  Administered 2014-06-24: 100 mg via ORAL
  Filled 2014-06-24: qty 1
  Filled 2014-06-24: qty 14
  Filled 2014-06-24: qty 1

## 2014-06-24 NOTE — Progress Notes (Signed)
Patient ID: Natalie [name], female   DOB: 1991/02/23, 23 y.o.   MRN: [id number] [facility] MD Progress Note  06/24/2014 11:12 AM Natalie [name]  MRN:  [id number] Subjective:   Patient states:" I have problem taking medications stares and walks around. Poorly cooperative and kept looking aloof to questions" Objective:  Patient was seen and her chart was  reviewed. Patient continues to express delusional thinking-concerned that she wants her papers so she can live independently from her mother. Blunt affect and still somewehat isolated with no interaction with her peers and often paces the hallway and stares into spaces for minutes as if internally preoccupied. Patient verbal output was much less today and remaied guarded not interested to talk, remains bizarre. There was a concern while she was standing if may fall. Staff reported she has not fallen and does that when you try to communicate with her.    Diagnosis:   DSM5: Total Time spent with patient: 20 minutes AXIS I: Bipolar 1 disorder, most recent episode mixed with psychosis  AXIS II: Cluster B Traits  AXIS III:  Past Medical History   Diagnosis  Date   .  Psychiatric diagnosis    .  Chronic bipolar disorder    .  OCD (obsessive compulsive disorder)    .  H/O suicide attempt  05-2013     OD   .  Anxiety    .  Depression     AXIS IV: economic problems, housing problems, occupational problems, other psychosocial or environmental problems, problems with access to health care services and problems with primary support group  AXIS V: 21-30 behavior considerably influenced by delusions or hallucinations OR serious impairment in judgment, communication OR inability to function in almost all areas  ADL's:  fair  Sleep: fair   Appetite:  Fair   Suicidal Ideation:  Denies Homicidal Ideation:  Denies AEB (as evidenced by):  Psychiatric Specialty Exam: Physical Exam  Psychiatric: Her affect is labile. Her speech is delayed and  tangential. She is aggressive, withdrawn and actively hallucinating. Thought content is paranoid. Cognition and memory are normal. She expresses impulsivity.    Review of Systems  Constitutional: Negative for fever, chills, weight loss, malaise/fatigue and diaphoresis.  HENT: Negative for congestion, ear discharge, ear pain, hearing loss, nosebleeds and tinnitus.   Eyes: Negative for blurred vision, double vision, photophobia, pain, discharge and redness.  Respiratory: Negative for cough, hemoptysis, sputum production, shortness of breath and stridor.   Cardiovascular: Negative for chest pain, palpitations, orthopnea, claudication and leg swelling.  Gastrointestinal: Negative for heartburn, nausea, vomiting, abdominal pain, diarrhea, constipation and blood in stool.  Genitourinary: Negative for dysuria, urgency, frequency and hematuria.  Musculoskeletal: Negative for back pain, joint pain, myalgias and neck pain.  Skin: Negative for itching and rash.  Neurological: Negative for dizziness, tingling, tremors, sensory change, speech change, focal weakness, seizures and headaches.  Endo/Heme/Allergies: Negative for environmental allergies. Does not bruise/bleed easily.  Psychiatric/Behavioral: Positive for depression and hallucinations. The patient is nervous/anxious and has insomnia.     Blood pressure 104/61, pulse 71, temperature 98.1 F (36.7 C), temperature source Oral, resp. rate 18, height 5\' 4"  (1.626 m), weight 186 lb (84.369 kg), last menstrual period 05/26/2014, SpO2 100.00%.Body mass index is 31.91 kg/(m^2).  General Appearance: fairly groomed   05/28/2014::  Good,still with a tendency to stare  Speech:  Blocked and Slow   Volume:  Decreased  Mood:  Anxious  Affect: blunted  Thought Process:  Disorganized-  Orientation:  NA, fully alert and attentive   Thought Content:   patient denies hallucinations, but at times presents as internally preoccupied , appears guarded   Suicidal  Thoughts:  No- patient is not endorsing suicidal or homicidal idations  Homicidal Thoughts:  No  Memory: unable to assess, patient uncooperative   Judgement:  Poor  Insight:  Lacking  Psychomotor Activity:  Decreased  Concentration:  Fair  Recall:  Poor  Fund of Knowledge:Fair  Language: Fair  Akathisia:  No  Handed:  Right  AIMS (if indicated):     Assets:  Communication Skills Desire for Improvement Physical Health  Sleep:  Number of Hours: 4   Musculoskeletal: Strength & Muscle Tone: within normal limits Gait & Station: normal Patient leans: N/A  Current Medications: Current Facility-Administered Medications  Medication Dose Route Frequency Provider Last Rate Last Dose  . acetaminophen (TYLENOL) tablet 650 mg  650 mg Oral Q6H PRN Kerry Hough, PA-C      . alum & mag hydroxide-simeth (MAALOX/MYLANTA) 200-200-20 MG/5ML suspension 30 mL  30 mL Oral Q4H PRN Kerry Hough, PA-C      . benztropine (COGENTIN) tablet 0.5 mg  0.5 mg Oral BID Mojeed Akintayo   0.5 mg at 06/24/14 0749  . divalproex (DEPAKOTE) DR tablet 1,000 mg  1,000 mg Oral QHS Mojeed Akintayo   1,000 mg at 06/23/14 2103  . docusate sodium (COLACE) capsule 100 mg  100 mg Oral Daily Mojeed Akintayo   100 mg at 06/24/14 0746  . feeding supplement (ENSURE COMPLETE) (ENSURE COMPLETE) liquid 237 mL  237 mL Oral BID BM Jeoffrey Massed, RD   237 mL at 06/22/14 1016  . haloperidol (HALDOL) tablet 5 mg  5 mg Oral BID Mojeed Akintayo   5 mg at 06/24/14 0746  . hydrOXYzine (ATARAX/VISTARIL) tablet 25 mg  25 mg Oral Q6H PRN Kerry Hough, PA-C   25 mg at 06/14/14 1353  . lamoTRIgine (LAMICTAL) tablet 50 mg  50 mg Oral Daily Mojeed Akintayo   50 mg at 06/24/14 0746  . LORazepam (ATIVAN) tablet 1 mg  1 mg Oral Q6H PRN Fransisca Kaufmann, NP   1 mg at 06/24/14 0746  . magnesium hydroxide (MILK OF MAGNESIA) suspension 30 mL  30 mL Oral Daily PRN Kerry Hough, PA-C   30 mL at 06/14/14 2048  . multivitamin with minerals tablet 1  tablet  1 tablet Oral Daily Jeoffrey Massed, RD   1 tablet at 06/24/14 0746  . OLANZapine (ZYPREXA) injection 10 mg  10 mg Intramuscular Q8H PRN Mojeed Akintayo       Or  . OLANZapine zydis (ZYPREXA) disintegrating tablet 10 mg  10 mg Oral Q8H PRN Mojeed Akintayo   10 mg at 06/24/14 0746  . propranolol (INDERAL) tablet 20 mg  20 mg Oral BID Mojeed Akintayo   20 mg at 06/24/14 0746  . traZODone (DESYREL) tablet 200 mg  200 mg Oral QHS Mojeed Akintayo   200 mg at 06/23/14 2104    Lab Results:  No results found for this or any previous visit (from the past 48 hour(s)).  Physical Findings: AIMS: Facial and Oral Movements Muscles of Facial Expression: None, normal Lips and Perioral Area: None, normal Jaw: None, normal Tongue: None, normal,Extremity Movements Upper (arms, wrists, hands, fingers): None, normal Lower (legs, knees, ankles, toes): None, normal, Trunk Movements Neck, shoulders, hips: None, normal, Overall Severity Severity of abnormal movements (highest score from questions above): None, normal Incapacitation due to  abnormal movements: None, normal Patient's awareness of abnormal movements (rate only patient's report): No Awareness, Dental Status Current problems with teeth and/or dentures?: No Does patient usually wear dentures?: No  CIWA:    COWS:     Assessment: Some improvement compared to yesterday- less selectively mute, more verbal communication. Still blunted, guarded, (+) thought blocking. No medication side effects ( Abilify, Lamictal)   Treatment Plan Summary: Daily contact with patient to assess and evaluate symptoms and progress in treatment Medication management  Plan: 1. Continue inpatient treatment , support, and meds as below  -Continue Depakote 1000mg  po qhs for mood -Continue Haldol 5mg  po BID for delusions and psychosis -Continue Cogentin 0.5mg  bid for EPS prevention. Will cut down trazadone to 100mg  to prevent any extra sedation and faill.  - Change   Inderal to 20 mg BID for Tachycardia -Valproic acid level on 06/25/14   Medical Decision Making Problem Points:  Established problem, improving (1) and Review of last therapy session (1) Data Points:  Review of medication regiment & side effects (2)  I certify that inpatient services furnished can reasonably be expected to improve the patient's condition.   , MD 06/24/2014, 11:12 AM

## 2014-06-24 NOTE — [facility] Group Notes (Signed)
[facility] Group Notes:  (Nursing/MHT/Case Management/Adjunct)  Date:  06/23/2014 Time:  9:10AM  Type of Therapy:  Psychoeducational Skills  Participation Level:  Active  Participation Quality:  Appropriate  Affect:  Appropriate  Cognitive:  Alert and Appropriate  Insight:  Good  Engagement in Group:  Improving  Modes of Intervention:  Discussion  Summary of Progress/Problems:  Kellie Moor 06/24/2014, 8:48 AM

## 2014-06-24 NOTE — Progress Notes (Signed)
Patient ID: Natalie [name], female   DOB: 10-14-1991, 23 y.o.   MRN: [id number] D. Patient presents with depressed mood , affect flat again today. [name] continues to have thought blocking, and her thoughts remain disorganized. She reports feeling ''very irritable and agitated , I don't want to be bothered '' this morning with Clinical research associate. She continues to focus on ''getting everything together for discharge and when I'm leaving what I'm going to do '' A. Medications given as ordered. Support and encouragement provided. Discussed above information with Dr. Lynnae Sandhoff. R. Patient is safe. Will continue to monitor q 15 minutes for safety.

## 2014-06-24 NOTE — Plan of Care (Signed)
Problem: Alteration in mood Goal: STG-Patient is able to discuss feelings and issues (Patient is able to discuss feelings and issues leading to depression)  Outcome: Not Progressing Patient continues to forward little information and displays thought blocking. She is often times difficult to engage.

## 2014-06-24 NOTE — Progress Notes (Signed)
Patient continues to have blank, flat affect. Forwards little information during 1:1. She states she is worried about having her clothes washed and remains focused on this issue even when topic of conversation is changed. She states she is ready to discharge Monday and states, "I've just been here too long. I'm ready to go." Continues to display thought blocking. Medicated per orders, support and reassurance provided. She denies SI/HI/AVH and remains safe at present. Lawrence Marseilles

## 2014-06-24 NOTE — [facility] Group Notes (Signed)
[facility] Group Notes:  (Nursing/MHT/Case Management/Adjunct)  Date:  06/24/2014  Time:  9:00AM  Type of Therapy:  Self Inventory  Participation Level:  Active  Participation Quality:  Appropriate  Affect:  Appropriate  Cognitive:  Alert  Insight:  Improving  Engagement in Group:  Improving  Modes of Intervention:  Discussion  Summary of Progress/Problems: pt appropriate in group. Minimal conversation, but still participating. Pt improving from when first being admitted   Kellie Moor 06/24/2014, 12:18 PM

## 2014-06-24 NOTE — [facility] Group Notes (Signed)
[facility] LCSW Group Therapy  06/24/2014 3:53 PM  Type of Therapy:  Group Therapy  Participation Level:  Did Not Attend  Participation Quality:  N/A  Affect:  N/A  Cognitive:  N/A  Insight:  N/A  Engagement in Therapy:  N/A  Modes of Intervention:  Discussion, Education, Exploration, Rapport Building, Socialization and Support  Summary of Progress/Problems: Pt did not attend.   Seabron Spates 06/24/2014, 3:53 PM

## 2014-06-24 NOTE — [facility] Group Notes (Signed)
[facility] Group Notes:  (Nursing/MHT/Case Management/Adjunct)  Date:  06/23/2014   Time:  9:00AM  Type of Therapy:  Self Inventory  Participation Level:  Minimal  Participation Quality:  Appropriate  Affect:  Appropriate  Cognitive:  Appropriate  Insight:  Good  Engagement in Group:  Improving  Modes of Intervention:  Discussion  Summary of Progress/Problems:  Kellie Moor 06/24/2014, 8:46 AM

## 2014-06-24 NOTE — [facility] Group Notes (Signed)
[facility] Group Notes:  (Nursing/MHT/Case Management/Adjunct)  Date:  06/24/2014  Time:  9:10AM  Type of Therapy:  Psychoeducational Skills  Participation Level:  Minimal  Participation Quality:  Appropriate  Affect:  Appropriate  Cognitive:  Appropriate  Insight:  Improving  Engagement in Group:  Improving  Modes of Intervention:  Discussion  Summary of Progress/Problems:  Natalie [name] 06/24/2014, 12:23 PM

## 2014-06-24 NOTE — Progress Notes (Signed)
Adult Psychoeducational Group Note  Date:  06/24/2014 Time:  8:43 PM  Group Topic/Focus:  Wrap-Up Group:   The focus of this group is to help patients review their daily goal of treatment and discuss progress on daily workbooks.  Participation Level:  Active  Participation Quality:  Appropriate  Affect:  Appropriate  Cognitive:  Appropriate  Insight: Lacking  Engagement in Group:  Improving  Modes of Intervention:  Discussion  Additional Comments: The patient expressed that her day was not good.The patient said that thing did not go the way she would have like them to go.  Octavio Manns 06/24/2014, 8:43 PM

## 2014-06-24 NOTE — [facility] Group Notes (Signed)
[facility] LCSW Group Therapy  06/24/2014 3:55 PM  Type of Therapy:  Group Therapy  Participation Level:  Did Not Attend  Participation Quality:  N/A  Affect:  N/A  Cognitive:  N/A  Insight:  N/A  Engagement in Therapy:  N/A  Modes of Intervention:  N/A  Summary of Progress/Problems: Pt did not attend  Seabron Spates 06/24/2014, 3:55 PM

## 2014-06-25 DIAGNOSIS — F3160 Bipolar disorder, current episode mixed, unspecified: Secondary | ICD-10-CM

## 2014-06-25 DIAGNOSIS — F429 Obsessive-compulsive disorder, unspecified: Secondary | ICD-10-CM

## 2014-06-25 LAB — VALPROIC ACID LEVEL: Valproic Acid Lvl: 85.8 ug/mL (ref 50.0–100.0)

## 2014-06-25 MED ORDER — DIVALPROEX SODIUM 500 MG PO DR TAB: 1000.0000 mg | DELAYED_RELEASE_TABLET | Freq: Every day | ORAL | Status: DC

## 2014-06-25 MED ORDER — ADULT MULTIVITAMIN W/MINERALS CH: 1.0000 | ORAL_TABLET | Freq: Every day | ORAL | Status: DC

## 2014-06-25 MED ORDER — TRAZODONE HCL 100 MG PO TABS: 100.0000 mg | ORAL_TABLET | Freq: Every day | ORAL | Status: DC

## 2014-06-25 MED ORDER — BENZTROPINE MESYLATE 0.5 MG PO TABS: 0.5000 mg | ORAL_TABLET | Freq: Two times a day (BID) | ORAL | Status: DC

## 2014-06-25 MED ORDER — PROPRANOLOL HCL 20 MG PO TABS: 20.0000 mg | ORAL_TABLET | Freq: Two times a day (BID) | ORAL | Status: DC

## 2014-06-25 MED ORDER — HALOPERIDOL 5 MG PO TABS: 5.0000 mg | ORAL_TABLET | Freq: Two times a day (BID) | ORAL | Status: DC

## 2014-06-25 NOTE — Progress Notes (Signed)
[facility] Adult Case Management Discharge Plan :  Will you be returning to the same living situation after discharge: Yes,  home At discharge, do you have transportation home?:Yes,  parents Do you have the ability to pay for your medications:Yes,  mental health  Release of information consent forms completed and in the chart;  Patient's signature needed at discharge.  Patient to Follow up at: Follow-up Information   Follow up with [facility]. (Your next scheduled appointment is Sept 16 at 1:00 with Dr June Leap.  In the meantime, you will need to go to the walk-in clinic the first week in August between 8 and 9 AM for your hospital follow up appointment.)    Contact information:   [address] [facility]        Patient denies SI/HI:   Yes,  yes    Aeronautical engineer and Suicide Prevention discussed:  Yes,  yes  Daryel Gerald B 06/25/2014, 10:15 AM

## 2014-06-25 NOTE — Progress Notes (Signed)
Pt remains withdrawn and quiet. Affect blank, flat and stares off. She gives minimal information and mood is depressed. Pt given support. Medicated without difficulty and educated about need for med compliance upon discharge. She verbalized understanding. She denies SI/HI/AVH and remains safe. Lawrence Marseilles

## 2014-06-25 NOTE — [facility] Suicide Risk Assessment (Signed)
   Demographic Factors:  Low socioeconomic status, Unemployed and female  Total Time spent with patient: 20 minutes  Psychiatric Specialty Exam: Physical Exam  Psychiatric: Her speech is normal and behavior is normal. Judgment and thought content normal. Her mood appears anxious. Cognition and memory are normal.    Review of Systems  Constitutional: Negative.   HENT: Negative.   Eyes: Negative.   Respiratory: Negative.   Cardiovascular: Negative.   Gastrointestinal: Negative.   Genitourinary: Negative.   Musculoskeletal: Negative.   Skin: Negative.   Neurological: Negative.   Endo/Heme/Allergies: Negative.   Psychiatric/Behavioral: Negative.     Blood pressure 113/68, pulse 77, temperature 98.2 F (36.8 C), temperature source Oral, resp. rate 16, height 5\' 4"  (1.626 m), weight 84.369 kg (186 lb), last menstrual period 05/26/2014, SpO2 100.00%.Body mass index is 31.91 kg/(m^2).  General Appearance: Fairly Groomed  05/28/2014::  Minimal  Speech:  Clear and Coherent  Volume:  Normal  Mood:  Euthymic  Affect:  Appropriate  Thought Process:  Goal Directed  Orientation: to time, place and person  Thought Content:  NA  Suicidal Thoughts:  No  Homicidal Thoughts:  No  Memory:  Immediate;   Fair Recent;   Fair Remote;   Fair  Judgement:  Fair  Insight:  Fair  Psychomotor Activity:  Normal  Concentration:  Fair  Recall:  [phone] of Knowledge:Fair  Language: Fair  Akathisia:  No  Handed:  Right  AIMS (if indicated):     Assets:  Communication Skills Desire for Improvement Physical Health  Sleep:  Number of Hours: 5.5    Musculoskeletal: Strength & Muscle Tone: within normal limits Gait & Station: normal Patient leans: N/A   Mental Status Per Nursing Assessment::   On Admission:  NA  Current Mental Status by Physician: patient denies suicidal ideation, intent or plan  Loss Factors: Financial problems/change in socioeconomic status  Historical  Factors: NA  Risk Reduction Factors:   Living with another person, especially a relative and Positive social support  Continued Clinical Symptoms:  Resolving mood symptoms and delusions  Cognitive Features That Contribute To Risk:  Closed-mindedness    Suicide Risk:  Minimal: No identifiable suicidal ideation.  Patients presenting with no risk factors but with morbid ruminations; may be classified as minimal risk based on the severity of the depressive symptoms  Discharge Diagnoses:   AXIS I:  Bipolar I disorder, most recent episode (or current) mixed, unspecified              Obsessive Compulsive Disorder AXIS II:  Cluster B Traits AXIS III:   Past Medical History  Diagnosis Date  . None reported 05-2013   AXIS IV:  other psychosocial or environmental problems and problems related to social environment AXIS V:  61-70 mild symptoms  Plan Of Care/Follow-up recommendations:  Activity:  as tolerated Diet:  healthy Tests:  Valproic acid level: 85.8 Other:  patient to keep her after care appointment  Is patient on multiple antipsychotic therapies at discharge:  No   Has Patient had three or more failed trials of antipsychotic monotherapy by history:  No  Recommended Plan for Multiple Antipsychotic Therapies: NA    06-2013, MD 06/25/2014, 10:00 AM

## 2014-06-25 NOTE — Progress Notes (Signed)
Patient ID: Natalie [name], female   DOB: 02-03-1991, 23 y.o.   MRN: [id number] D: Pt is irritable this morning.  Sitting in room with arms crossed resistant to answer questions.  When asked if anything is bothering her she states, "something is always bothering me."   During medication pass pt was angry to hear that she was prescribed Haldol.  A: Staff discussed this with patient and she eventually took her medications.  R: Pt. Continues to be guarded and somewhat irritable.

## 2014-06-25 NOTE — Progress Notes (Signed)
Patient ID: Natalie [name], female   DOB: Aug 29, 1991, 23 y.o.   MRN: [id number] DIS-CHARGE  NOTE  ----   Dis-charge pt home as ordered.  Mother and father met pt. In front lobby.  All prescriptions were provided and explained .  Pt. agreed to attend al;l out-pt. Appointments after dc.    All possessions were returned to the pt.   Th ept. Was happt to be going home but maintained a sad falt affect with no sign of emotion, even when she met her family in the front lobby.    She agreed to stay safe and to be compliant on her medications  At home.   She denied any pain or dis-comfort  And was safe at time of dis-charge.   A  --  Escort pt. To front lobby  To met her family  At  1800 hrs., 06/25/14.   R  ---    Pt. Went home with parents and was safe at dc time

## 2014-06-25 NOTE — [facility] Suicide Risk Assessment (Signed)
[facility] INPATIENT:  Family/Significant Other Suicide Prevention Education  Suicide Prevention Education:  Education Completed; No one has been identified by the patient as the family member/significant other with whom the patient will be residing, and identified as the person(s) who will aid the patient in the event of a mental health crisis (suicidal ideations/suicide attempt).  With written consent from the patient, the family member/significant other has been provided the following suicide prevention education, prior to the and/or following the discharge of the patient.  The suicide prevention education provided includes the following:  Suicide risk factors  Suicide prevention and interventions  National Suicide Hotline telephone number  [facility] assessment telephone number  [facility] Emergency Assistance 911  [facility] and/or Residential Mobile Crisis Unit telephone number  Request made of family/significant other to:  Remove weapons (e.g., guns, rifles, knives), all items previously/currently identified as safety concern.    Remove drugs/medications (over-the-counter, prescriptions, illicit drugs), all items previously/currently identified as a safety concern.  The family member/significant other verbalizes understanding of the suicide prevention education information provided.  The family member/significant other agrees to remove the items of safety concern listed above. The patient did not endorse SI at the time of admission, nor did the patient c/o SI during the stay here.  SPE not required.   Daryel Gerald B 06/25/2014, 10:17 AM

## 2014-06-25 NOTE — Plan of Care (Signed)
Problem: Ineffective individual coping Goal: STG: Patient will participate in after care plan Pt plans to return home, follow up outpt. Goal met. R North LCSW 06/14/2014 11:22 AM  Outcome: Progressing Patient met with primary nurse and parents today. She signed consent form for SW to speak with parents prior to her tentative discharge tomorrow. Reviewed need for med compliance and pt verbalized understanding.  Problem: Alteration in thought process Goal: STG-Patient is able to sleep at least 6 hours per night Outcome: Not Progressing Pt is only sleeping 3-4 hours per night.

## 2014-06-25 NOTE — Discharge Summary (Signed)
Physician Discharge Summary Note  Patient:  Natalie [name] is an 23 y.o., female MRN:  [id number] DOB:  [birth year] Patient phone:  [phone] (home)  Patient address:   [address] [place] [address],  Total Time spent with patient: 20 minutes  Date of Admission:  06/13/2014 Date of Discharge: 06/25/14  Reason for Admission:  Psychosis   Discharge Diagnoses: Principal Problem:   Bipolar I disorder, most recent episode (or current) mixed with psychosis Active Problems:   OCD (obsessive compulsive disorder)   Bipolar 1 disorder, manic, moderate   Psychiatric Specialty Exam: Physical Exam  Psychiatric: Her mood appears anxious.    Review of Systems  Constitutional: Negative.   HENT: Negative.   Eyes: Negative.   Respiratory: Negative.   Cardiovascular: Negative.   Gastrointestinal: Negative.   Genitourinary: Negative.   Musculoskeletal: Negative.   Skin: Negative.   Neurological: Negative.   Endo/Heme/Allergies: Negative.   Psychiatric/Behavioral: Negative.     Blood pressure 113/68, pulse 77, temperature 98.2 F (36.8 C), temperature source Oral, resp. rate 16, height 5\' 4"  (1.626 m), weight 84.369 kg (186 lb), last menstrual period 05/26/2014, SpO2 100.00%.Body mass index is 31.91 kg/(m^2).   Past Psychiatric History: See H&P Diagnosis:  Hospitalizations:  Outpatient Care:  Substance Abuse Care:  Self-Mutilation:  Suicidal Attempts:  Violent Behaviors:   Musculoskeletal: Strength & Muscle Tone: within normal limits Gait & Station: normal Patient leans: N/A  DSM5:  AXIS I: Bipolar I disorder, most recent episode (or current) mixed, unspecified  Obsessive Compulsive Disorder  AXIS II: Cluster B Traits  AXIS III:  Past Medical History   Diagnosis  Date   .  None reported  05-2013   AXIS IV: other psychosocial or environmental problems and problems related to social environment  AXIS V: 61-70 mild symptoms  Level of Care:  OP  Hospital  Course:  Natalie [name] is a 23 y.o. female patient who was brought voluntarily to the [facility] due to bizarre behaviors. She was recently discharged from [facility] 400 hall on 06/04/14. Her family reported that patient had been acting catatonic with delayed responses to questions. In the ED that patient acted confused being unable to focus on assessment due to fear that she was in the wrong room. Patient states today during her psychiatric assessment "I did not tell the truth the last time. I was not comfortable with my living arrangements. I came back for help and a place to live. I applied for social security. I have no income. I have paranoia. Sometimes I assume things. My parents brought me here. I was feeling agitated. My medicine is not a problem. I don't like my father's temper." Her responses during the assessment were very delayed and eye contact was minimal. Nursing staff report that the patient is acting very paranoid on the unit. She acts very suspicious about towards staff. Patient has questioned the accuracy of her medications. She has asked to speak to the Nurse but then refused to speak.          Natalie [name] was admitted to the adult 400 unit where she was evaluated and her symptoms were identified. Medication management was discussed and implemented. Her medications were adjusted to address her symptoms. Patient was started on Depakote 1,000 mg hs for mood lability, Haldol 5 mg BID for psychosis, Inderal 20 mg BID for anxiety, and Trazodone 100 mg hs for insomnia. She was encouraged to participate in unit programming. At times she was noted to  have severe thought blocking and disorganized thoughts. She would have very long pauses before responding to assessment questions. Patient expressed paranoia during her hospital stay. She often worried about receiving the wrong medications.  Medical problems were identified and treated appropriately. Home medication was restarted as needed. She was  evaluated each day by a clinical provider to ascertain the patient's response to treatment.  Improvement was noted by the patient's report of decreasing symptoms, improved sleep and appetite, affect, medication tolerance, behavior, and participation in unit programming.  The patient was asked each day to complete a self inventory noting mood, mental status, pain, new symptoms, anxiety and concerns.         She responded well to medication and being in a therapeutic and supportive environment. Positive and appropriate behavior was noted and the patient was motivated for recovery.  She worked closely with the treatment team and case manager to develop a discharge plan with appropriate goals. Coping skills, problem solving as well as relaxation therapies were also part of the unit programming.         By the day of discharge she was in much improved condition than upon admission.  Symptoms were reported as significantly decreased or resolved completely.  The patient denied SI/HI and voiced no AVH. She was motivated to continue taking medication with a goal of continued improvement in mental health. Natalie [name] was discharged home with a plan to follow up as noted below.  Consults:  psychiatry  Significant Diagnostic Studies:  Chemistry, CBC, Depakote level, UDS negative   Discharge Vitals:   Blood pressure 113/68, pulse 77, temperature 98.2 F (36.8 C), temperature source Oral, resp. rate 16, height 5\' 4"  (1.626 m), weight 84.369 kg (186 lb), last menstrual period 05/26/2014, SpO2 100.00%. Body mass index is 31.91 kg/(m^2). Lab Results:   Results for orders placed during the hospital encounter of 06/13/14 (from the past 72 hour(s))  VALPROIC ACID LEVEL     Status: None   Collection Time    06/25/14  6:21 AM      Result Value Ref Range   Valproic Acid Lvl 85.8  50.0 - 100.0 ug/mL   Comment: Performed at [facility]    Physical Findings: AIMS: Facial and Oral Movements Muscles of  Facial Expression: None, normal Lips and Perioral Area: None, normal Jaw: None, normal Tongue: None, normal,Extremity Movements Upper (arms, wrists, hands, fingers): None, normal Lower (legs, knees, ankles, toes): None, normal, Trunk Movements Neck, shoulders, hips: None, normal, Overall Severity Severity of abnormal movements (highest score from questions above): None, normal Incapacitation due to abnormal movements: None, normal Patient's awareness of abnormal movements (rate only patient's report): No Awareness, Dental Status Current problems with teeth and/or dentures?: No Does patient usually wear dentures?: No  CIWA:    COWS:     Psychiatric Specialty Exam: See Psychiatric Specialty Exam and Suicide Risk Assessment completed by Attending Physician prior to discharge.  Discharge destination:  Home  Is patient on multiple antipsychotic therapies at discharge:  No   Has Patient had three or more failed trials of antipsychotic monotherapy by history:  No  Recommended Plan for Multiple Antipsychotic Therapies: NA     Medication List    STOP taking these medications       amantadine 100 MG capsule  Commonly known as:  SYMMETREL     ARIPiprazole 10 MG tablet  Commonly known as:  ABILIFY     lamoTRIgine 200 MG tablet  Commonly known as:  LAMICTAL      TAKE these medications     Indication   benztropine 0.5 MG tablet  Commonly known as:  COGENTIN  Take 1 tablet (0.5 mg total) by mouth 2 (two) times daily.   Indication:  Extrapyramidal Reaction caused by Medications     divalproex 500 MG DR tablet  Commonly known as:  DEPAKOTE  Take 2 tablets (1,000 mg total) by mouth at bedtime.   Indication:  Rapidly Alternating Manic-Depressive Psychosis     haloperidol 5 MG tablet  Commonly known as:  HALDOL  Take 1 tablet (5 mg total) by mouth 2 (two) times daily.   Indication:  Psychosis     multivitamin with minerals Tabs tablet  Take 1 tablet by mouth daily. May  purchase over the counter.   Indication:  Vitamin Supplementation     propranolol 20 MG tablet  Commonly known as:  INDERAL  Take 1 tablet (20 mg total) by mouth 2 (two) times daily.   Indication:  anxiety/tachycardia     STOOL SOFTENER PO  Take 1 tablet by mouth daily as needed (constipation).      traZODone 100 MG tablet  Commonly known as:  DESYREL  Take 1 tablet (100 mg total) by mouth at bedtime.   Indication:  Trouble Sleeping           Follow-up Information   Follow up with [facility]. (Your next scheduled appointment is Sept 16 at 1:00 with Dr June Leap.  In the meantime, you will need to go to the walk-in clinic the first week in August between 8 and 9 AM for your hospital follow up appointment.)    Contact information:   [address]  [place]        Follow-up recommendations:   Activity: as tolerated  Diet: healthy  Tests: Valproic acid level: 85.8  Other: patient to keep her after care appointment   Comments:   Take all your medications as prescribed by your mental healthcare provider.  Report any adverse effects and or reactions from your medicines to your outpatient provider promptly.  Patient is instructed and cautioned to not engage in alcohol and or illegal drug use while on prescription medicines.  In the event of worsening symptoms, patient is instructed to call the crisis hotline, 911 and or go to the nearest ED for appropriate evaluation and treatment of symptoms.  Follow-up with your primary care provider for your other medical issues, concerns and or health care needs.   Total Discharge Time:  Greater than 30 minutes.  SignedFransisca Kaufmann NP-C 06/25/2014, 9:24 AM  Patient seen, evaluated and I agree with notes by Nurse Practitioner. Thedore Mins, MD

## 2014-06-25 NOTE — Tx Team (Signed)
  Interdisciplinary Treatment Plan Update   Date Reviewed:  06/25/2014  Time Reviewed:  10:13 AM  Progress in Treatment:   Attending groups: Yes Participating in groups: Yes Taking medication as prescribed: Yes  Tolerating medication: Yes Family/Significant other contact made: Yes  Patient understands diagnosis: Yes  Discussing patient identified problems/goals with staff: Yes Medical problems stabilized or resolved: Yes Denies suicidal/homicidal ideation: Yes Patient has not harmed self or others: Yes  For review of initial/current patient goals, please see plan of care.  Estimated Length of Stay:  D/C today  Reason for Continuation of Hospitalization:   New Problems/Goals identified:  N/A  Discharge Plan or Barriers:   return home, follow up outpt  Additional Comments:  Attendees:  Signature: Thedore Mins, MD 06/25/2014 10:13 AM   Signature: Richelle Ito, LCSW 06/25/2014 10:13 AM  Signature: Fransisca Kaufmann, NP 06/25/2014 10:13 AM  Signature: Joslyn Devon, RN 06/25/2014 10:13 AM  Signature: Liborio Nixon, RN 06/25/2014 10:13 AM  Signature:  06/25/2014 10:13 AM  Signature:   06/25/2014 10:13 AM  Signature:    Signature:    Signature:    Signature:    Signature:    Signature:      Scribe for Treatment Team:   Richelle Ito, LCSW  06/25/2014 10:13 AM

## 2014-06-29 NOTE — Progress Notes (Signed)
Patient Discharge Instructions:  After Visit Summary (AVS):   Faxed to:  06/29/14 Discharge Summary Note:   Faxed to:  06/29/14 Psychiatric Admission Assessment Note:   Faxed to:  06/29/14 Suicide Risk Assessment - Discharge Assessment:   Faxed to:  06/29/14 Faxed/Sent to the Next Level Care provider:  06/29/14 Faxed to [facility] @ [phone]  Jerelene Redden, 06/29/2014, 2:31 PM

## 2014-07-07 ENCOUNTER — Emergency Department (HOSPITAL_COMMUNITY)
Admission: EM | Admit: 2014-07-07 | Discharge: 2014-07-07 | Discharge status: Against Medical Advice | Payer: Medicaid Other | Attending: Emergency Medicine | Admitting: Emergency Medicine

## 2014-07-07 DIAGNOSIS — F329 Major depressive disorder, single episode, unspecified: Secondary | ICD-10-CM | POA: Diagnosis present

## 2014-07-07 DIAGNOSIS — F3289 Other specified depressive episodes: Secondary | ICD-10-CM | POA: Diagnosis present

## 2014-07-07 DIAGNOSIS — F32A Depression, unspecified: Secondary | ICD-10-CM

## 2014-07-11 ENCOUNTER — Encounter (HOSPITAL_COMMUNITY): Payer: Self-pay | Admitting: Emergency Medicine

## 2014-07-11 ENCOUNTER — Emergency Department (HOSPITAL_COMMUNITY)
Admission: EM | Admit: 2014-07-11 | Discharge: 2014-07-12 | Disposition: A | Discharge status: Other Acute Inpatient Hospital | Payer: Medicaid Other | Attending: Emergency Medicine | Admitting: Emergency Medicine

## 2014-07-11 DIAGNOSIS — F411 Generalized anxiety disorder: Secondary | ICD-10-CM | POA: Diagnosis not present

## 2014-07-11 DIAGNOSIS — R45851 Suicidal ideations: Secondary | ICD-10-CM | POA: Diagnosis present

## 2014-07-11 DIAGNOSIS — F3289 Other specified depressive episodes: Secondary | ICD-10-CM | POA: Diagnosis not present

## 2014-07-11 DIAGNOSIS — F489 Nonpsychotic mental disorder, unspecified: Secondary | ICD-10-CM | POA: Diagnosis not present

## 2014-07-11 DIAGNOSIS — Z3202 Encounter for pregnancy test, result negative: Secondary | ICD-10-CM | POA: Insufficient documentation

## 2014-07-11 DIAGNOSIS — F329 Major depressive disorder, single episode, unspecified: Secondary | ICD-10-CM

## 2014-07-11 DIAGNOSIS — Z79899 Other long term (current) drug therapy: Secondary | ICD-10-CM | POA: Diagnosis not present

## 2014-07-11 DIAGNOSIS — F319 Bipolar disorder, unspecified: Secondary | ICD-10-CM | POA: Insufficient documentation

## 2014-07-11 DIAGNOSIS — F32A Depression, unspecified: Secondary | ICD-10-CM

## 2014-07-11 LAB — COMPREHENSIVE METABOLIC PANEL
ALBUMIN: 3.9 g/dL (ref 3.5–5.2)
ALK PHOS: 71 U/L (ref 39–117)
ALT: 15 U/L (ref 0–35)
AST: 17 U/L (ref 0–37)
Anion gap: 14 (ref 5–15)
BILIRUBIN TOTAL: 0.4 mg/dL (ref 0.3–1.2)
BUN: 8 mg/dL (ref 6–23)
CHLORIDE: 99 meq/L (ref 96–112)
CO2: 24 mEq/L (ref 19–32)
Calcium: 9.5 mg/dL (ref 8.4–10.5)
Creatinine, Ser: 0.66 mg/dL (ref 0.50–1.10)
GFR calc Af Amer: >90 mL/min (ref >90)
GFR calc non Af Amer: >90 mL/min (ref >90)
Glucose, Bld: 77 mg/dL (ref 70–99)
POTASSIUM: 4.3 meq/L (ref 3.7–5.3)
Sodium: 137 mEq/L (ref 137–147)
Total Protein: 7.4 g/dL (ref 6.0–8.3)

## 2014-07-11 LAB — CBC
HCT: 37.7 % (ref 36.0–46.0)
Hemoglobin: 12.7 g/dL (ref 12.0–15.0)
MCH: 31.4 pg (ref 26.0–34.0)
MCHC: 33.7 g/dL (ref 30.0–36.0)
MCV: 93.3 fL (ref 78.0–100.0)
PLATELETS: 160 10*3/uL (ref 150–400)
RBC: 4.04 MIL/uL (ref 3.87–5.11)
RDW: 13.8 % (ref 11.5–15.5)
WBC: 9.9 10*3/uL (ref 4.0–10.5)

## 2014-07-11 LAB — RAPID URINE DRUG SCREEN, HOSP PERFORMED
Amphetamines: NOT DETECTED
BARBITURATES: NOT DETECTED
Benzodiazepines: NOT DETECTED
COCAINE: NOT DETECTED
Opiates: NOT DETECTED
TETRAHYDROCANNABINOL: NOT DETECTED

## 2014-07-11 LAB — PREGNANCY, URINE: Preg Test, Ur: NEGATIVE

## 2014-07-11 LAB — ETHANOL

## 2014-07-11 LAB — ACETAMINOPHEN LEVEL

## 2014-07-11 LAB — SALICYLATE LEVEL: Salicylate Lvl: <2 mg/dL — ABNORMAL LOW (ref 2.8–20.0)

## 2014-07-11 NOTE — ED Notes (Signed)
The pt was out of her meds  fior 24 hooooooooooours and today she was attempting to cut herself at home.  Angry and depressed

## 2014-07-11 NOTE — ED Notes (Signed)
Pt has been wanded by security. 

## 2014-07-11 NOTE — ED Notes (Signed)
A-c contacted for a needed sitter

## 2014-07-11 NOTE — ED Notes (Signed)
Reintroduced self to Pt. Talked to her about what prompted her return tonight. She states that she wanted to cut herself. I asked her how she had been doing. She just stared forward and didn't say anything. I asked her had she been taking her meds. She stated she been. I asked her if she was still staying with her parents. She stared forward and did not answer. I asked her if they brought her here she said "yes." I again asked her if she still lived with them. She said "yes." She stared forward and did not answer anything else.

## 2014-07-11 NOTE — ED Provider Notes (Signed)
CSN: [id number]     Arrival date & time 07/11/14  1837 History   First MD Initiated Contact with Patient 07/11/14 2037     Chief Complaint  Patient presents with  . Suicidal     (Consider location/radiation/quality/duration/timing/severity/associated sxs/prior Treatment) HPI Comments: 23 year old female with bipolar, OCD, suicide attempt in the past, depression and anxiety presents after suicide attempts to try to cut herself at home to 2 worsening anger and depression the past 24 hours. Patient has been out of her meds for 24 hours and has not take it regularly consistently. Patient has been inpatient for psychiatric issues in the past. Patient will not provide further details of this time.  The history is provided by the patient and medical records.    Past Medical History  Diagnosis Date  . Psychiatric diagnosis   . Chronic bipolar disorder   . OCD (obsessive compulsive disorder)   . H/O suicide attempt 05-2013    OD  . Anxiety   . Depression    History reviewed. No pertinent past surgical history. No family history on file. History  Substance Use Topics  . Smoking status: Never Smoker   . Smokeless tobacco: Not on file  . Alcohol Use: No   OB History   Grav Para Term Preterm Abortions TAB SAB Ect Mult Living                 Review of Systems  Unable to perform ROS: Psychiatric disorder      Allergies  Review of patient's allergies indicates no known allergies.  Home Medications   Prior to Admission medications   Medication Sig Start Date End Date Taking? Authorizing Provider  benztropine (COGENTIN) 0.5 MG tablet Take 1 tablet (0.5 mg total) by mouth 2 (two) times daily. 06/25/14  Yes Fransisca Kaufmann, NP  divalproex (DEPAKOTE) 500 MG DR tablet Take 2 tablets (1,000 mg total) by mouth at bedtime. 06/25/14   Fransisca Kaufmann, NP  Docusate Calcium (STOOL SOFTENER PO) Take 1 tablet by mouth daily as needed (constipation).    Historical Provider, MD  haloperidol (HALDOL) 5 MG  tablet Take 1 tablet (5 mg total) by mouth 2 (two) times daily. 06/25/14   Fransisca Kaufmann, NP  Multiple Vitamin (MULTIVITAMIN WITH MINERALS) TABS tablet Take 1 tablet by mouth daily. May purchase over the counter. 06/25/14   Fransisca Kaufmann, NP  propranolol (INDERAL) 20 MG tablet Take 1 tablet (20 mg total) by mouth 2 (two) times daily. 06/25/14   Fransisca Kaufmann, NP  traZODone (DESYREL) 100 MG tablet Take 1 tablet (100 mg total) by mouth at bedtime. 06/25/14   Fransisca Kaufmann, NP   BP 122/71  Pulse 89  Temp(Src) 97.9 F (36.6 C) (Oral)  Resp 22  SpO2 99% Physical Exam  Nursing note and vitals reviewed. Constitutional: She is oriented to person, place, and time. She appears well-developed and well-nourished.  HENT:  Head: Normocephalic and atraumatic.  Eyes: Conjunctivae are normal. Right eye exhibits no discharge. Left eye exhibits no discharge.  Neck: Normal range of motion. Neck supple. No tracheal deviation present.  Cardiovascular: Normal rate and regular rhythm.   Pulmonary/Chest: Effort normal and breath sounds normal.  Abdominal: Soft. She exhibits no distension. There is no tenderness. There is no guarding.  Musculoskeletal: She exhibits no edema.  Neurological: She is alert and oriented to person, place, and time. No cranial nerve deficit.  Skin: Skin is warm. No rash noted.  Psychiatric: Her affect is angry. She is slowed. She exhibits  a depressed mood. She expresses suicidal ideation. She expresses no homicidal ideation. She expresses suicidal plans. She expresses no homicidal plans.  Flat affect, mild anger, poor eye contact    ED Course  Procedures (including critical care time) Labs Review Labs Reviewed  SALICYLATE LEVEL - Abnormal; Notable for the following:    Salicylate Lvl <2.0 (*)    All other components within normal limits  ACETAMINOPHEN LEVEL  CBC  COMPREHENSIVE METABOLIC PANEL  ETHANOL  URINE RAPID DRUG SCREEN (HOSP PERFORMED)  PREGNANCY, URINE    Imaging Review No  results found.   EKG Interpretation None      MDM   Final diagnoses:  None   Patient with complicated psychiatric history presents with worsening depression, anger and suicide attempt and plan to cut herself. Patient has poor eye contact the ER and will not provide significant details however does admit that she is a plan to cut herself to harm herself. At this time patient is willing and wants help to get better. Patient is medically clear during my exam. Psychiatric and [facility] health assessment pending. Pt will be signed out to follow up [facility] recs and monitor.   Bipolar, depression, suicidal ideation    Enid Skeens, MD 07/11/14 2315

## 2014-07-11 NOTE — ED Notes (Signed)
MD at bedside. 

## 2014-07-11 NOTE — ED Notes (Signed)
Pager [phone]

## 2014-07-11 NOTE — ED Notes (Signed)
She was last in an institution 2 weeks ago.  She has been angry and depressed since then.  Chg rn contacted

## 2014-07-11 NOTE — ED Notes (Signed)
Patient Contact Information: Natalie [name] (Mother and Father) [phone] (c) [phone]

## 2014-07-12 ENCOUNTER — Encounter (HOSPITAL_COMMUNITY): Payer: Self-pay | Admitting: *Deleted

## 2014-07-12 ENCOUNTER — Inpatient Hospital Stay (HOSPITAL_COMMUNITY)
Admission: AD | Admit: 2014-07-12 | Discharge: 2014-07-20 | DRG: 885 | Disposition: A | Discharge status: Home / Self Care | Payer: Medicaid Other | Source: Intra-hospital | Attending: Psychiatry | Admitting: Psychiatry

## 2014-07-12 ENCOUNTER — Encounter (HOSPITAL_COMMUNITY): Payer: Self-pay | Admitting: Emergency Medicine

## 2014-07-12 DIAGNOSIS — R45851 Suicidal ideations: Secondary | ICD-10-CM

## 2014-07-12 DIAGNOSIS — Z9119 Patient's noncompliance with other medical treatment and regimen: Secondary | ICD-10-CM

## 2014-07-12 DIAGNOSIS — F429 Obsessive-compulsive disorder, unspecified: Secondary | ICD-10-CM | POA: Diagnosis present

## 2014-07-12 DIAGNOSIS — G4700 Insomnia, unspecified: Secondary | ICD-10-CM | POA: Diagnosis present

## 2014-07-12 DIAGNOSIS — Z609 Problem related to social environment, unspecified: Secondary | ICD-10-CM | POA: Diagnosis not present

## 2014-07-12 DIAGNOSIS — Z91199 Patient's noncompliance with other medical treatment and regimen due to unspecified reason: Secondary | ICD-10-CM | POA: Diagnosis not present

## 2014-07-12 DIAGNOSIS — F411 Generalized anxiety disorder: Secondary | ICD-10-CM | POA: Diagnosis present

## 2014-07-12 DIAGNOSIS — F3130 Bipolar disorder, current episode depressed, mild or moderate severity, unspecified: Secondary | ICD-10-CM | POA: Diagnosis present

## 2014-07-12 DIAGNOSIS — F3289 Other specified depressive episodes: Secondary | ICD-10-CM | POA: Diagnosis not present

## 2014-07-12 DIAGNOSIS — F329 Major depressive disorder, single episode, unspecified: Secondary | ICD-10-CM | POA: Diagnosis not present

## 2014-07-12 DIAGNOSIS — F319 Bipolar disorder, unspecified: Secondary | ICD-10-CM | POA: Diagnosis present

## 2014-07-12 DIAGNOSIS — F3160 Bipolar disorder, current episode mixed, unspecified: Secondary | ICD-10-CM

## 2014-07-12 MED ORDER — DIVALPROEX SODIUM 500 MG PO DR TAB
1000.0000 mg | DELAYED_RELEASE_TABLET | Freq: Every day | ORAL | Status: DC
  Administered 2014-07-12 – 2014-07-19 (×8): 1000 mg via ORAL
  Filled 2014-07-12 (×8): qty 2
  Filled 2014-07-12: qty 6
  Filled 2014-07-12 (×3): qty 2

## 2014-07-12 MED ORDER — DIVALPROEX SODIUM 250 MG PO DR TAB: 1000.0000 mg | DELAYED_RELEASE_TABLET | Freq: Every day | ORAL | Status: DC

## 2014-07-12 MED ORDER — ALUM & MAG HYDROXIDE-SIMETH 200-200-20 MG/5ML PO SUSP: 30.0000 mL | ORAL | Status: DC | PRN

## 2014-07-12 MED ORDER — TRAZODONE HCL 100 MG PO TABS
100.0000 mg | ORAL_TABLET | Freq: Every day | ORAL | Status: DC
  Administered 2014-07-12 – 2014-07-19 (×8): 100 mg via ORAL
  Filled 2014-07-12 (×11): qty 1
  Filled 2014-07-12: qty 3

## 2014-07-12 MED ORDER — ACETAMINOPHEN 325 MG PO TABS: 650.0000 mg | ORAL_TABLET | Freq: Four times a day (QID) | ORAL | Status: DC | PRN

## 2014-07-12 MED ORDER — ADULT MULTIVITAMIN W/MINERALS CH
1.0000 | ORAL_TABLET | Freq: Every day | ORAL | Status: DC
  Administered 2014-07-12: 1 via ORAL
  Filled 2014-07-12: qty 1

## 2014-07-12 MED ORDER — PROPRANOLOL HCL 20 MG PO TABS
20.0000 mg | ORAL_TABLET | Freq: Two times a day (BID) | ORAL | Status: DC
  Administered 2014-07-12: 20 mg via ORAL
  Filled 2014-07-12 (×4): qty 1

## 2014-07-12 MED ORDER — HALOPERIDOL 5 MG PO TABS
5.0000 mg | ORAL_TABLET | Freq: Two times a day (BID) | ORAL | Status: DC
  Administered 2014-07-12 – 2014-07-20 (×15): 5 mg via ORAL
  Filled 2014-07-12: qty 6
  Filled 2014-07-12 (×16): qty 1
  Filled 2014-07-12: qty 6
  Filled 2014-07-12 (×6): qty 1

## 2014-07-12 MED ORDER — HALOPERIDOL 5 MG PO TABS
5.0000 mg | ORAL_TABLET | Freq: Two times a day (BID) | ORAL | Status: DC
  Administered 2014-07-12: 5 mg via ORAL
  Filled 2014-07-12: qty 1

## 2014-07-12 MED ORDER — BENZTROPINE MESYLATE 1 MG PO TABS
0.5000 mg | ORAL_TABLET | Freq: Two times a day (BID) | ORAL | Status: DC
  Administered 2014-07-12: 0.5 mg via ORAL
  Filled 2014-07-12: qty 1

## 2014-07-12 MED ORDER — TRAZODONE HCL 50 MG PO TABS: 100.0000 mg | ORAL_TABLET | Freq: Every day | ORAL | Status: DC

## 2014-07-12 MED ORDER — DOCUSATE SODIUM 100 MG PO CAPS
100.0000 mg | ORAL_CAPSULE | Freq: Two times a day (BID) | ORAL | Status: DC
  Administered 2014-07-12: 100 mg via ORAL
  Filled 2014-07-12 (×2): qty 1

## 2014-07-12 MED ORDER — BENZTROPINE MESYLATE 0.5 MG PO TABS
0.5000 mg | ORAL_TABLET | Freq: Two times a day (BID) | ORAL | Status: DC
  Administered 2014-07-12 – 2014-07-20 (×15): 0.5 mg via ORAL
  Filled 2014-07-12 (×20): qty 1
  Filled 2014-07-12: qty 6
  Filled 2014-07-12: qty 1
  Filled 2014-07-12: qty 6

## 2014-07-12 MED ORDER — PROPRANOLOL HCL 20 MG PO TABS
20.0000 mg | ORAL_TABLET | Freq: Two times a day (BID) | ORAL | Status: DC
  Administered 2014-07-12 – 2014-07-20 (×14): 20 mg via ORAL
  Filled 2014-07-12 (×2): qty 2
  Filled 2014-07-12 (×7): qty 1
  Filled 2014-07-12: qty 2
  Filled 2014-07-12: qty 1
  Filled 2014-07-12: qty 6
  Filled 2014-07-12 (×4): qty 1
  Filled 2014-07-12: qty 6
  Filled 2014-07-12 (×6): qty 1

## 2014-07-12 MED ORDER — MAGNESIUM HYDROXIDE 400 MG/5ML PO SUSP
30.0000 mL | Freq: Every day | ORAL | Status: DC | PRN
  Administered 2014-07-13: 30 mL via ORAL

## 2014-07-12 NOTE — Progress Notes (Signed)
D: Took over patient's care @ 2330. Patient in bed sleeping. Respiration regular and unlabored. No sign of distress noted at this time A: 15 mins checks for safety. R: Patient is safe.   

## 2014-07-12 NOTE — BH Assessment (Signed)
Pt has been referred to the following facilities and follow-up by TTS is required:  [facility]- Per Calvin-beds available. Referral information faxed for review.  [facility]-Per Baird Lyons no beds.  [facility]- Per Heather-beds available and referral information faxed for review. Per Lorelee New pt's referral information is under review. [facility] notified that patient is no longer in need of bed placement.  [facility]-Per [place], unable to confirm bed status but can receive referrals. Referral information faxed for review. Per Cordelia Pen pt referral information is under review. Cordelia Pen informed that pt is no longer in need of bed placement.  [facility]- Per [employer]- at capacity.  [facility]-Reached voicemail and left message for someone to contact TTS regarding bed availability.  [facility]-Spoke with Leta Jungling who anticipates discharges today but no available beds at this time.  [facility]-left message on voice message center for someone to call back regarding bed availability.   [facility]- Per Merita Norton no beds available today but welcomes referral information to be faxed and reviewed for waitlist. Information faxed to [facility] for review. Misty Stanley was notified at [facility] that patient no longer needs bed placement.  [facility]-Per Sam no indigent beds available.   Glorious Peach, MS, LCASA  Assessment Counselor

## 2014-07-12 NOTE — ED Provider Notes (Signed)
  Physical Exam  BP 113/66  Pulse 69  Temp(Src) 97.8 F (36.6 C) (Oral)  Resp 18  SpO2 99%  Physical Exam  ED Course  Procedures  Patient here with suicidal ideation. Sleeping this AM, no issues as per nursing. Pending placement.     Richardean Canal, MD 07/12/14 [phone]

## 2014-07-12 NOTE — Tx Team (Signed)
Initial Interdisciplinary Treatment Plan   PATIENT STRESSORS: Medication change or noncompliance   PROBLEM LIST: Problem List/Patient Goals Date to be addressed Date deferred Reason deferred Estimated date of resolution  Suicidal Ideation 07/12/14     Medication compliance 07/12/14                                                DISCHARGE CRITERIA:  Ability to meet basic life and health needs Verbal commitment to aftercare and medication compliance  PRELIMINARY DISCHARGE PLAN: Return to previous living arrangement  PATIENT/FAMIILY INVOLVEMENT: This treatment plan has been presented to and reviewed with the patient, Natalie [name], and/or family member, .  The patient and family have been given the opportunity to ask questions and make suggestions.  Natalie [name], [name] 07/12/2014, 9:12 PM

## 2014-07-12 NOTE — BH Assessment (Signed)
Per Shelbie Proctor [facility] patient has been accepted and assigned to bed 407-2. This Clinical research associate spoke with pt's nurse Clydie Braun whom agreed to have patient sign support documentation and will fax to [facility] prior to pt transfer.  Glorious Peach, MS, LCASA Assessment Counselor

## 2014-07-12 NOTE — Progress Notes (Signed)
23 year old female pt admitted on voluntary basis. Pt, on admission, did endorse depression and suicidal intent with plan to cut wrist. Pt did state that she has not taking her medications as she should but does not feel like that is the reason why she did what she did. Pt slow with her thought process and has minimal eye contact and did report that she has on-going marital issues. Pt reports that she lives with her parents and that her spouse lives with his parents and spoke about how when she gets discharged she would like to eventually live with her husband. Pt is able to contract for safety on the unit. Pt was oriented to the unit and safety maintained.

## 2014-07-12 NOTE — Progress Notes (Signed)
  CARE MANAGEMENT ED NOTE 07/12/2014  Patient:  Natalie [name], Natalie [name]   Account Number:  [id number]  Date Initiated:  07/12/2014  Documentation initiated by:  Ferdinand Cava  Subjective/Objective Assessment:   23 yo female presenting to the ED with SI     Subjective/Objective Assessment Detail:     Action/Plan:   Action/Plan Detail:   Anticipated DC Date:       Status Recommendation to Physician:   Result of Recommendation:        Choice offered to / List presented to:            Status of service:    ED Comments:   ED Comments Detail:  This CM was consulted to speak with the patients spouse, Sahily Biddle [phone]. He stated that his wife has been treated and released several times by [facility] and she either goes to live with his mother or her parents and she continues to have the same problems with her bipolar and paranoia. He stated that he has not been involved or consulted regarding his wife's treatment and discharge planning. He stated that he feels the discharge plans have not been appropriate and this time he wants to be contacted regarding her treatment and discharge plan. He feels that his wife needs to be placed in a long term (residential) treatment center or placed in a group home. This CM explained to Mr. Tyndall that this information would be communicated to the staff RN Clydie Braun and that once the teleassessment was performed that this information would then be communicated to the counselor involved in the patients care. Mr. Giebler verbalized understanding.

## 2014-07-12 NOTE — Progress Notes (Signed)
Spoke with pt and her husband re: disposition and other issues.  Husband is concerned that pt's [facility] admissions are "not helping her" and that she needs "long term placement" in a "group home" or psychiatric facility.  Pt nodded in agreement with husband, but CSW unsure if pt was understanding the conversation, as she did not say much and was looking at the floor.  Husband is concerned that pt's parents, with whom she lives, go not have pt's best interests in mind and keep information re: pt from her husband.  CSW is not completely sure as to why pt/husband live apart-he lives with his mother, although pt used to live there, too.  Neither pt/nor husband have an income or insurance, although both are disability pending.  Husband would like for both of them to live on their own in an apartment, once they have money and pt "gets well."  Pt is unable/unwilling articulate whether or not she wants to live with her husband or her parents.  [name] husband would also consider the both of them moving to a [facility] or ALF in the future.  Pt not forthcoming with much information re: her current living situation or what she desires for the future.  Per pt/husband, pt is compliant with her visits to [facility] and medications.  Pt states her parents help her to get to her appointments.  Husband requests that he be allowed to get information re: pt when she is at [facility].

## 2014-07-12 NOTE — BH Assessment (Signed)
Tele Assessment Note   Natalie [name] is a 23 y.o. female who voluntarily presents to [facility] with SI/Depression.  Pt is SI with plan to cut wrists, stating that she was worried about what her family thinks when they saw her with knife in her hand.  Pt reports stressors triggering her current emotional state: family issues/conflict and worried about marital issue--"I been separated for awhile now".  Pt reports a previous SI attempt in her teens by overdose.  Pt states that she has not been complaint with her meds and says her last intake was less than 2 wks ago.  Pt states she can contract for safety, however it is this writer's opinion that pt cannot reliably contract for safety. Pt is despondent and has poor eye contact--looking at the floor during the interview.  Pt some thought blocking while talking with this Clinical research associate and has a delayed response when answering questions.  Pt is irritable at times and per nursing notes, not forthcoming with information.  Donell Sievert, PA, recommends inpt admission for mood/med stabilization.   Axis I: Bipolar, Depressed Axis II: Deferred Axis III:  Past Medical History  Diagnosis Date  . Psychiatric diagnosis   . Chronic bipolar disorder   . OCD (obsessive compulsive disorder)   . H/O suicide attempt 05-2013    OD  . Anxiety   . Depression    Axis IV: other psychosocial or environmental problems, problems related to social environment and problems with primary support group Axis V: 31-40 impairment in reality testing  Past Medical History:  Past Medical History  Diagnosis Date  . Psychiatric diagnosis   . Chronic bipolar disorder   . OCD (obsessive compulsive disorder)   . H/O suicide attempt 05-2013    OD  . Anxiety   . Depression     History reviewed. No pertinent past surgical history.  Family History: History reviewed. No pertinent family history.  Social History:  reports that she has never smoked. She does not have any smokeless tobacco  history on file. She reports that she does not drink alcohol or use illicit drugs.  Additional Social History:  Alcohol / Drug Use Pain Medications: See MAR  Prescriptions: See MAR  Over the Counter: See MAR  History of alcohol / drug use?: No history of alcohol / drug abuse Longest period of sobriety (when/how long): None   CIWA: CIWA-Ar BP: 107/66 mmHg Pulse Rate: 67 COWS:    PATIENT STRENGTHS: (choose at least two) Supportive family/friends  Allergies: No Known Allergies  Home Medications:  (Not in a hospital admission)  OB/GYN Status:  No LMP recorded. Patient is not currently having periods (Reason: Other).  General Assessment Data Location of Assessment: [facility] ED Is this a Tele or Face-to-Face Assessment?: Tele Assessment Is this an Initial Assessment or a Re-assessment for this encounter?: Initial Assessment Living Arrangements: Parent (Lives with parents ) Can pt return to current living arrangement?: Yes Admission Status: Voluntary Is patient capable of signing voluntary admission?: Yes Transfer from: Acute Hospital Referral Source: MD  Medical Screening Exam [facility] Walk-in ONLY) Medical Exam completed: No Reason for MSE not completed: Other: (None )  [facility] Crisis Care Plan Living Arrangements: Parent (Lives with parents ) Name of Psychiatrist: Alemu Mengistu Name of Therapist: Annia Friendly- Vesta Mixer  Education Status Is patient currently in school?: No Current Grade: None  Highest grade of school patient has completed: None  Name of school: None  Contact person: None   Risk to self with the past 6  months Suicidal Ideation: Yes-Currently Present Suicidal Intent: Yes-Currently Present Is patient at risk for suicide?: Yes Suicidal Plan?: Yes-Currently Present Specify Current Suicidal Plan: Cut self  Access to Means: Yes Specify Access to Suicidal Means: Sharps, Knives  What has been your use of drugs/alcohol within the last 12 months?: Pt denies  Previous  Attempts/Gestures: Yes How many times?: 1 Other Self Harm Risks: None  Triggers for Past Attempts: Unknown;Unpredictable Intentional Self Injurious Behavior: None Family Suicide History: No Recent stressful life event(s): Conflict (Comment);Loss (Comment) (Issues with family; marital problems(separated from spouse) ) Persecutory voices/beliefs?: No Depression: Yes Depression Symptoms: Despondent;Insomnia;Isolating;Loss of interest in usual pleasures;Feeling angry/irritable;Feeling worthless/self pity Substance abuse history and/or treatment for substance abuse?: No Suicide prevention information given to non-admitted patients: Not applicable  Risk to Others within the past 6 months Homicidal Ideation: No Thoughts of Harm to Others: No Current Homicidal Intent: No Current Homicidal Plan: No Access to Homicidal Means: No Identified Victim: None  History of harm to others?: No Assessment of Violence: None Noted Violent Behavior Description: None  Does patient have access to weapons?: No Criminal Charges Pending?: No Does patient have a court date: No  Psychosis Hallucinations: None noted Delusions: None noted  Mental Status Report Appear/Hygiene: In scrubs Eye Contact: Poor Motor Activity: Unremarkable Speech: Logical/coherent;Soft Level of Consciousness: Alert Mood: Depressed;Irritable Affect: Blunted;Irritable Anxiety Level: None Thought Processes: Coherent;Relevant Judgement: Impaired Orientation: Person;Place;Time;Situation Obsessive Compulsive Thoughts/Behaviors: None  Cognitive Functioning Concentration: Normal Memory: Recent Intact;Remote Intact IQ: Average Insight: Poor Impulse Control: Poor Appetite: Fair Weight Loss: 0 Weight Gain: 0 Sleep: Decreased Total Hours of Sleep: 4 Vegetative Symptoms: Decreased grooming  ADLScreening [facility] Assessment Services) Patient's cognitive ability adequate to safely complete daily activities?: Yes Patient able to  express need for assistance with ADLs?: Yes Independently performs ADLs?: Yes (appropriate for developmental age)  Prior Inpatient Therapy Prior Inpatient Therapy: Yes Prior Therapy Dates: 2015 Prior Therapy Facilty/Provider(s): [facility]  Reason for Treatment: SI/Depression   Prior Outpatient Therapy Prior Outpatient Therapy: Yes Prior Therapy Dates: Current  Prior Therapy Facilty/Provider(s): [facility] Reason for Treatment: Med Mgt   ADL Screening (condition at time of admission) Patient's cognitive ability adequate to safely complete daily activities?: Yes Is the patient deaf or have difficulty hearing?: No Does the patient have difficulty seeing, even when wearing glasses/contacts?: No Does the patient have difficulty concentrating, remembering, or making decisions?: Yes Patient able to express need for assistance with ADLs?: Yes Does the patient have difficulty dressing or bathing?: No Independently performs ADLs?: Yes (appropriate for developmental age) Does the patient have difficulty walking or climbing stairs?: No Weakness of Legs: None Weakness of Arms/Hands: None  Home Assistive Devices/Equipment Home Assistive Devices/Equipment: None  Therapy Consults (therapy consults require a physician order) PT Evaluation Needed: No OT Evalulation Needed: No SLP Evaluation Needed: No Abuse/Neglect Assessment (Assessment to be complete while patient is alone) Physical Abuse: Denies Verbal Abuse: Denies Sexual Abuse: Denies Exploitation of patient/patient's resources: Denies Self-Neglect: Denies Values / Beliefs Cultural Requests During Hospitalization: None Spiritual Requests During Hospitalization: None Consults Spiritual Care Consult Needed: No Social Work Consult Needed: No Merchant navy officer (For Healthcare) Does patient have an advance directive?: No Would patient like information on creating an advanced directive?: No - patient declined information Nutrition Screen- [facility]  Adult/WL/AP Patient's home diet: Regular  Additional Information 1:1 In Past 12 Months?: No CIRT Risk: No Elopement Risk: No Does patient have medical clearance?: Yes     Disposition:  Disposition Initial Assessment Completed for this Encounter:  Yes Disposition of Patient: Inpatient treatment program;Referred to Donell Sievert, [place] recommend inpt admission ) Type of inpatient treatment program: Adult Other disposition(s): Other (Comment) Donell Sievert, [place] recommend inpt admission ) Patient referred to: Other (Comment) Donell Sievert, [place] recommend inpt )  Murrell Redden 07/12/2014 5:49 AM

## 2014-07-13 ENCOUNTER — Encounter (HOSPITAL_COMMUNITY): Payer: Self-pay | Admitting: Psychiatry

## 2014-07-13 DIAGNOSIS — R45851 Suicidal ideations: Secondary | ICD-10-CM

## 2014-07-13 DIAGNOSIS — F3164 Bipolar disorder, current episode mixed, severe, with psychotic features: Secondary | ICD-10-CM

## 2014-07-13 LAB — VALPROIC ACID LEVEL: Valproic Acid Lvl: 79.2 ug/mL (ref 50.0–100.0)

## 2014-07-13 NOTE — BHH Counselor (Signed)
Adult Psychosocial Assessment Update Interdisciplinary Team  Previous [facility] admissions/discharges:  Admissions Discharges  Date:  12/22/13 Date:  Date:  05/29/14 Date:  Date:  06/13/14 Date:  Date: Date:  Date: Date:   Changes since the last Psychosocial Assessment (including adherence to outpatient mental health and/or substance abuse treatment, situational issues contributing to decompensation and/or relapse). Natalie [name] admitted the last time she was here that she does not like living with her parents, that she wants to reunite with her husband, and that she can be very stubborn when she does not get her way.  She also admits to noncompliance with meds.  Although she was committed to going to the mental health association last time she left, she admits today that she did not go.             Discharge Plan 1. Will you be returning to the same living situation after discharge?   Yes:X  home No:      If no, what is your plan?           2. Would you like a referral for services when you are discharged? Yes:     If yes, for what services?  No:   X  Already open at [facility]           Summary and Recommendations (to be completed by the evaluator) Natalie [name] is an iimmature 23 YO AA female who is on her 4th admission here in the last 8 months; the 3rd in the past 6 weeks.  She appears to have an agenda about coming to the hospital, but it is unclear exactly what that is.  She believes she does not belong on the [facility] hall.  "It's the wrong hall for me."  She can benefit from crises stabilization, referral for services, medication management and therapeutic milieu.                       Signature:  Ida Rogue, 07/13/2014 3:58 PM

## 2014-07-13 NOTE — Progress Notes (Signed)
Patient ID: Natalie [name], female   DOB: 07/04/1991, 22 y.o.   MRN: [id number] D: Patient standing in hallway watching husband leave. Pt seemed agitated and refuse to talk to writer. Pt stated "i don't want to talk about it". Pt mood and affect appeared depressed and flat.  Pt interaction with peers/staff is minimal. No acute distressed noted at this time.   A: Met with pt 1:1. Support and encouragement provided. Medications administered as prescribed. Writer encouraged pt to discuss feelings. Pt encouraged to come to staff with any question or concerns.   R: Patient remains safe. She is complaint with medications and denies any adverse reaction. Continue current POC.   

## 2014-07-13 NOTE — Progress Notes (Signed)
D: Patient mood depressed and facial expression blank. Affect blunted. Patient stated, "I am depressed." Verbal interactions minimal with staff and other patients. Interactions guarded. A: Support provided through active listening. Patient encouraged to attend and participate in groups throughout the day. Medications administered per order.  R: Patient compliant with medication regime on unit. Patient has attended groups today. Safety maintained via q 15 minute checks. Patient verbally agreed to seek out staff if feeling unsafe.

## 2014-07-13 NOTE — Progress Notes (Signed)
Nutrition Brief Note  Malnutrition Screening Tool result is inaccurate.  Please consult if nutrition needs are identified.  Juel Bellerose MS, RD, LDN [phone] Pager [phone] Weekend/After Hours Pager  

## 2014-07-13 NOTE — BHH Group Notes (Signed)
[facility] LCSW Aftercare Discharge Planning Group Note   07/13/2014 10:56 AM  Participation Quality:  Per usual from past admissions, pt wandered in and out of group several times.  Did not participate.    Daryel Gerald B

## 2014-07-13 NOTE — BHH Suicide Risk Assessment (Signed)
   Nursing information obtained from:    Demographic factors:    Current Mental Status:    Loss Factors:    Historical Factors:    Risk Reduction Factors:    Total Time spent with patient: 20 minutes  CLINICAL FACTORS:   Severe Anxiety and/or Agitation Bipolar Disorder:   Mixed State Depression:   Aggression Anhedonia Delusional Hopelessness Impulsivity Insomnia Obsessive-Compulsive Disorder  Psychiatric Specialty Exam: Physical Exam  Psychiatric: Judgment normal. Her mood appears anxious. Her speech is delayed. She is slowed and withdrawn. Thought content is paranoid. Cognition and memory are normal. She exhibits a depressed mood. She expresses suicidal ideation.    Review of Systems  Constitutional: Negative.   HENT: Negative.   Eyes: Negative.   Respiratory: Negative.   Cardiovascular: Negative.   Gastrointestinal: Negative.   Genitourinary: Negative.   Musculoskeletal: Negative.   Skin: Negative.   Neurological: Negative.   Endo/Heme/Allergies: Negative.   Psychiatric/Behavioral: Positive for depression and suicidal ideas. The patient is nervous/anxious and has insomnia.     Blood pressure 105/61, pulse 63, temperature 98.5 F (36.9 C), temperature source Oral, resp. rate 16, height 5\' 4"  (1.626 m), weight 86.183 kg (190 lb).Body mass index is 32.6 kg/(m^2).  General Appearance: Disheveled  Eye Contact::  Minimal  Speech:  Slow  Volume:  Decreased  Mood:  Dysphoric  Affect:  Constricted  Thought Process:  Circumstantial and Disorganized  Orientation:  Full (Time, Place, and Person)  Thought Content:  Delusions  Suicidal Thoughts:  Yes.  without intent/plan  Homicidal Thoughts:  No  Memory:  Immediate;   Fair Recent;   Fair Remote;   Fair  Judgement:  Impaired  Insight:  Lacking  Psychomotor Activity:  Decreased  Concentration:  Fair  Recall:  [id number] of Knowledge:Fair  Language: Fair  Akathisia:  No  Handed:  Right  AIMS (if indicated):      Assets:  Communication Skills Physical Health  Sleep:  Number of Hours: 1.5   Musculoskeletal: Strength & Muscle Tone: within normal limits Gait & Station: normal Patient leans: N/A  COGNITIVE FEATURES THAT CONTRIBUTE TO RISK:  Closed-mindedness Polarized thinking    SUICIDE RISK:   Mild:  Suicidal ideation of limited frequency, intensity, duration, and specificity.  There are no identifiable plans, no associated intent, mild dysphoria and related symptoms, good self-control (both objective and subjective assessment), few other risk factors, and identifiable protective factors, including available and accessible social support.  PLAN OF CARE:1. Admit for crisis management and stabilization. 2. Medication management to reduce current symptoms to base line and improve the     patient's overall level of functioning 3. Treat health problems as indicated. 4. Develop treatment plan to decrease risk of relapse upon discharge and the need for     readmission. 5. Psycho-social education regarding relapse prevention and self care. 6. Health care follow up as needed for medical problems. 7. Restart home medications where appropriate.  I certify that inpatient services furnished can reasonably be expected to improve the patient's condition.  [employer], MD 07/13/2014, 11:07 AM
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
## 2014-07-13 NOTE — Tx Team (Signed)
  Interdisciplinary Treatment Plan Update   Date Reviewed:  07/13/2014  Time Reviewed:  8:38 AM  Progress in Treatment:   Attending groups: No Participating in groups: No Taking medication as prescribed: Yes  Tolerating medication: Yes Family/Significant other contact made: No    Patient understands diagnosis: Yes  AEB saying that she needs to get back on medicaiotn. Discussing patient identified problems/goals with staff: Yes  See initial care plan Medical problems stabilized or resolved: Yes Denies suicidal/homicidal ideation: Yes  In tx team Patient has not harmed self or others: Yes  For review of initial/current patient goals, please see plan of care.  Estimated Length of Stay:  4-5 days  Reason for Continuation of Hospitalization: Medication stabilization Other; describe Paranoia, Disorganization  New Problems/Goals identified:  N/A  Discharge Plan or Barriers:   return home, follow up outpt  Additional Comments:  Natalie [name] is a 23 y.o. female who voluntarily presents to [facility] with SI/Depression. Pt is SI with plan to cut wrists, stating that she was worried about what her family thinks when they saw her with knife in her hand. Pt reports stressors triggering her current emotional state: family issues/conflict and worried about marital issue--"I been separated for awhile now". Pt reports a previous SI attempt in her teens by overdose. Pt states that she has not been complaint with her meds and says her last intake was less than 2 wks ago.    Attendees:  Signature: Thedore Mins, MD 07/13/2014 8:38 AM   Signature: Richelle Ito, LCSW 07/13/2014 8:38 AM  Signature: Fransisca Kaufmann, NP 07/13/2014 8:38 AM  Signature: Joslyn Devon, RN 07/13/2014 8:38 AM  Signature: Liborio Nixon, RN 07/13/2014 8:38 AM  Signature:  07/13/2014 8:38 AM  Signature:   07/13/2014 8:38 AM  Signature:    Signature:    Signature:    Signature:    Signature:    Signature:      Scribe for  Treatment Team:   Richelle Ito, LCSW  07/13/2014 8:38 AM

## 2014-07-14 NOTE — Progress Notes (Signed)
D.Pt has been up and visible in milieu, attended and participated in evening wrap-up group. Pt makes more eye contact and appears preoccupied and has some thought blocking but is able to respond appropriately to staff. A. Support and encouragement provided. R Safety maintained, will continue to monitor.

## 2014-07-14 NOTE — Progress Notes (Signed)
The focus of this group is to help patients review their daily goal of treatment and discuss progress on daily workbooks. Pt attended the evening group session and responded to all discussion prompts from the Writer. Pt shared that today was an okay day on the unit. Pt shared that her goal today was to be more social, which was something that did not come easy to her, and that she been successful with it today. Pt also said she felt that medications could not solve all her problems and that some problems required her to grow personally, a point that her peers agreed with. [name] did not make eye contact easily and appeared anxious, but was much more articulate and well-spoken now than in her previous admission.

## 2014-07-14 NOTE — [facility] Group Notes (Signed)
[facility] Group Notes:  (Clinical Social Work)  07/14/2014  11:15AM - 12:00PM  Summary of Progress/Problems:   The main focus of today's process group was to discuss feelings related to being hospitalized, as well as the difference between "being" and "having" a mental health diagnosis.  It was agreed in general by the group that it would be preferable to avoid future hospitalizations, and we discussed means of doing that.  As a follow-up, problems with adhering to medication recommendations were discussed.  The patient expressed that she feels insecure about being hospitalized, because she is much more accustomed to isolation than is allowed in the hospital.  She was insightful and forthcoming with answers to questions when asked directly.  Type of Therapy:  Group Therapy - Process  Participation Level:  Active  Participation Quality:  Attentive and Sharing  Affect:  Blunted  Cognitive:  Appropriate  Insight:  Developing/Improving  Engagement in Therapy:  Developing/Improving  Modes of Intervention:  Exploration, Discussion  Ambrose Mantle, LCSW 07/14/2014, 12:30

## 2014-07-14 NOTE — Progress Notes (Signed)
Patient ID: Natalie [name], female   DOB: 07-17-1991, 23 y.o.   MRN: [id number] Psychoeducational Group Note  Date:  07/14/2014 Time:0900am  Group Topic/Focus:  Identifying Needs:   The focus of this group is to help patients identify their personal needs that have been historically problematic and identify healthy behaviors to address their needs.  Participation Level:  Minimal  Participation Quality:  Inattentive and Resistant  Affect:  Flat  Cognitive:  Confused  Insight:  Limited  Engagement in Group:  Limited  Additional Comments:  Inventory group   Valente David 07/14/2014,10:15 AM

## 2014-07-14 NOTE — Progress Notes (Signed)
[facility] MD Progress Note  07/14/2014 2:44 PM Natalie [name]  MRN:  [id number] Subjective: Patient was seen sitting in the dayroom.  She was taken to the interview room where she stated that she is improving.  Patient reported that she is actively participating in groups.  Patient appeared guarded  When answering questions.  Patient had some thought blocking during this encounter and at times she appeared lost and bland.  Patient reported eating better and sleeping much better since she came in to the hospital.  She reported that she is worried about when she will be discharged and where she will be going after discharge.  She denied SI/HI/AVH.  We will continue to monitor patient. Diagnosis:   DSM5: Schizophrenia Disorders:  NA Obsessive-Compulsive Disorders:  NA Trauma-Stressor Disorders:  NA Substance/Addictive Disorders:  NA Depressive Disorders:  Bipolar disorder, depressed type. Total Time spent with patient: 30 minutes  Axis I: Bipolar, Depressed Axis II: Deferred Axis III:  Past Medical History  Diagnosis Date  . Psychiatric diagnosis   . Chronic bipolar disorder   . OCD (obsessive compulsive disorder)   . H/O suicide attempt 05-2013    OD  . Anxiety   . Depression    Axis IV: housing problems, occupational problems, other psychosocial or environmental problems and problems related to social environment Axis V: 41-50 serious symptoms  ADL's:  Impaired  Sleep: Good  Appetite:  Good  Suicidal Ideation:  Denied Homicidal Ideation:  Denied  Psychiatric Specialty Exam: Physical Exam  ROS  Blood pressure 92/54, pulse 72, temperature 97.7 F (36.5 C), temperature source Oral, resp. rate 16, height 5\' 4"  (1.626 m), weight 86.183 kg (190 lb).Body mass index is 32.6 kg/(m^2).  General Appearance: Casual  Eye Contact::  Fair  Speech:  Clear and Coherent  Volume:  Normal  Mood:  Depressed and Dysphoric  Affect:  Congruent, Constricted, Depressed and Flat  Thought  Process:  Coherent  Orientation:  Full (Time, Place, and Person)  Thought Content:  WDL  Suicidal Thoughts:  No  Homicidal Thoughts:  No  Memory:  Immediate;   Good Recent;   Fair Remote;   Fair  Judgement:  Fair  Insight:  Fair  Psychomotor Activity:  Normal  Concentration:  Good  Recall:  NA  Fund of Knowledge:Fair  Language: Good  Akathisia:  NA  Handed:  Right  AIMS (if indicated):     Assets:  Desire for Improvement Housing  Sleep:  Number of Hours: 4.25   Musculoskeletal: Strength & Muscle Tone: within normal limits Gait & Station: normal Patient leans: N/A  Current Medications: Current Facility-Administered Medications  Medication Dose Route Frequency Provider Last Rate Last Dose  . acetaminophen (TYLENOL) tablet 650 mg  650 mg Oral Q6H PRN [id number], NP      . alum & mag hydroxide-simeth (MAALOX/MYLANTA) 200-200-20 MG/5ML suspension 30 mL  30 mL Oral Q4H PRN 06-08-2001, NP      . benztropine (COGENTIN) tablet 0.5 mg  0.5 mg Oral BID Kristeen Mans, NP   0.5 mg at 07/14/14 0744  . divalproex (DEPAKOTE) DR tablet 1,000 mg  1,000 mg Oral QHS 07/16/14, NP   1,000 mg at 07/13/14 2103  . haloperidol (HALDOL) tablet 5 mg  5 mg Oral BID 2104, NP   5 mg at 07/14/14 0744  . magnesium hydroxide (MILK OF MAGNESIA) suspension 30 mL  30 mL Oral Daily PRN 07/16/14, NP   30 mL  at 07/13/14 1605  . propranolol (INDERAL) tablet 20 mg  20 mg Oral BID Kristeen Mans, NP   20 mg at 07/14/14 0744  . traZODone (DESYREL) tablet 100 mg  100 mg Oral QHS Kristeen Mans, NP   100 mg at 07/13/14 2103    Lab Results:  Results for orders placed during the hospital encounter of 07/12/14 (from the past 48 hour(s))  VALPROIC ACID LEVEL     Status: None   Collection Time    07/13/14  6:15 AM      Result Value Ref Range   Valproic Acid Lvl 79.2  50.0 - 100.0 ug/mL   Comment: Performed at [facility]    Physical Findings: AIMS: Facial and Oral Movements Muscles of  Facial Expression: None, normal Lips and Perioral Area: None, normal Jaw: None, normal Tongue: None, normal,Extremity Movements Upper (arms, wrists, hands, fingers): None, normal Lower (legs, knees, ankles, toes): None, normal, Trunk Movements Neck, shoulders, hips: None, normal, Overall Severity Severity of abnormal movements (highest score from questions above): None, normal Incapacitation due to abnormal movements: None, normal Patient's awareness of abnormal movements (rate only patient's report): No Awareness, Dental Status Current problems with teeth and/or dentures?: No Does patient usually wear dentures?: No  CIWA:    COWS:     Treatment Plan Summary: Daily contact with patient to assess and evaluate symptoms and progress in treatment Medication management  Plan: Continue with plan of care Continue crisis management Encourage to participate in group and individual sessions Continue medication management/ and review as needed Continue taking Divalproex 1000 mg po for mood stabilization Haldol 5 mg po bid for mood Benztropine 0.5 mg po bid for    Medication induced EPS Trazodone 100 mg po for sleep Discharge  Plan in progress Address health issues /V/S as needed    Medical Decision Making Problem Points:  Established problem, stable/improving (1) Data Points:  Review and summation of old records (2)  I certify that inpatient services furnished can reasonably be expected to improve the patient's condition.   Dahlia Byes, C   PMHNP-BC 07/14/2014, 2:44 PM Case discussed, record reviewed, agree with treatment plan Madie Reno A. Dub Mikes, M.D.

## 2014-07-14 NOTE — Progress Notes (Signed)
Patient ID: HAILIE SEARIGHT, female   DOB: Jul 09, 1991, 23 y.o.   MRN: [id number] 07-14-2014 nursing shift note: D: this pt has been irritable and withdrawn. At times when spoken to, she will remain mute. She is preoccupied and has stated that she is ready for discharge. A: staff continues to support and encourage, as well as attempt to establish a therapeutic relationship. R: on her inventory sheet she wrote: slept good, appetite good, energy normal, concentration good with her depression at 3. Her hopelessness is at 0 and anxiety at 3. He denies any withdrawal symptoms, suicidal thought or self harm thoughts. Her rated his pain at 1 but was unable to state where her hurt. He stated his goal today is to " work and hope to be on point for discharge". RN will monitor and Q 15 min ck's continue.

## 2014-07-14 NOTE — Progress Notes (Signed)
Patient ID: Natalie [name], female   DOB: Feb 24, 1991, 23 y.o.   MRN: [id number] Psychoeducational Group Note  Date:  07/14/2014 Time:0930am  Group Topic/Focus:  Identifying Needs:   The focus of this group is to help patients identify their personal needs that have been historically problematic and identify healthy behaviors to address their needs.  Participation Level:  Minimal  Participation Quality:  Inattentive  Affect:  Flat  Cognitive:  Confused  Insight:  Limited and Resistant  Engagement in Group:  Limited and Resistant  Additional Comments:  Healthy coping skills.   Valente David 07/14/2014,10:17 AM

## 2014-07-14 NOTE — Progress Notes (Signed)
Psychoeducational Group Note  Date:  07/14/2014 Time:  0412  Group Topic/Focus:  Wrap-Up Group:   The focus of this group is to help patients review their daily goal of treatment and discuss progress on daily workbooks.  Participation Level: Did Not Attend  Participation Quality:  Not Applicable  Affect:  Not Applicable  Cognitive:  Not Applicable  Insight:  Not Applicable  Engagement in Group: Not Applicable  Additional Comments:  The patient did not attend group this evening.   Shyanne Mcclary S 07/14/2014, 4:13 AM

## 2014-07-15 NOTE — Progress Notes (Signed)
Patient ID: Natalie [name], female   DOB: 1991/02/11, 23 y.o.   MRN: [id number] [facility] MD Progress Note  07/15/2014 12:42 PM Natalie [name]  MRN:  [id number] Subjective: Pt seen and chart reviewed. Pt denies SI, HI, and AVH, contracts for safety. However, pt reports high anxiety that is improving. Denies depression symptoms. Pt appears to be anxious and intermittently flat during this assessment, reporting that she "gets very upset when lots of people are talking in groups, but not when it's people alone".   Diagnosis:   DSM5: Schizophrenia Disorders:  NA Obsessive-Compulsive Disorders:  NA Trauma-Stressor Disorders:  NA Substance/Addictive Disorders:  NA Depressive Disorders:  Bipolar disorder, depressed type. Total Time spent with patient: 30 minutes  Axis I: Bipolar, Depressed Axis II: Deferred Axis III:  Past Medical History  Diagnosis Date  . Psychiatric diagnosis   . Chronic bipolar disorder   . OCD (obsessive compulsive disorder)   . H/O suicide attempt 05-2013    OD  . Anxiety   . Depression    Axis IV: housing problems, occupational problems, other psychosocial or environmental problems and problems related to social environment Axis V: 41-50 serious symptoms  ADL's:  Impaired  Sleep: Good  Appetite:  Good  Suicidal Ideation:  Denied Homicidal Ideation:  Denied  Psychiatric Specialty Exam: Physical Exam  ROS  Blood pressure 99/56, pulse 68, temperature 97.8 F (36.6 C), temperature source Oral, resp. rate 16, height 5\' 4"  (1.626 m), weight 86.183 kg (190 lb).Body mass index is 32.6 kg/(m^2).  General Appearance: Casual  Eye Contact::  Fair  Speech:  Clear and Coherent  Volume:  Normal  Mood:  Depressed and Dysphoric  Affect:  Congruent, Constricted, Depressed and Flat  Thought Process:  Coherent  Orientation:  Full (Time, Place, and Person)  Thought Content:  WDL  Suicidal Thoughts:  No  Homicidal Thoughts:  No  Memory:  Immediate;   Good Recent;    Fair Remote;   Fair  Judgement:  Fair  Insight:  Fair  Psychomotor Activity:  Normal  Concentration:  Good  Recall:  NA  Fund of Knowledge:Fair  Language: Good  Akathisia:  NA  Handed:  Right  AIMS (if indicated):     Assets:  Desire for Improvement Housing  Sleep:  Number of Hours: 3.75   Musculoskeletal: Strength & Muscle Tone: within normal limits Gait & Station: normal Patient leans: N/A  Current Medications: Current Facility-Administered Medications  Medication Dose Route Frequency Provider Last Rate Last Dose  . acetaminophen (TYLENOL) tablet 650 mg  650 mg Oral Q6H PRN [id number], NP      . alum & mag hydroxide-simeth (MAALOX/MYLANTA) 200-200-20 MG/5ML suspension 30 mL  30 mL Oral Q4H PRN 06-08-2001, NP      . benztropine (COGENTIN) tablet 0.5 mg  0.5 mg Oral BID Kristeen Mans, NP   0.5 mg at 07/15/14 0754  . divalproex (DEPAKOTE) DR tablet 1,000 mg  1,000 mg Oral QHS 07/17/14, NP   1,000 mg at 07/14/14 2112  . haloperidol (HALDOL) tablet 5 mg  5 mg Oral BID 2113, NP   5 mg at 07/15/14 0754  . magnesium hydroxide (MILK OF MAGNESIA) suspension 30 mL  30 mL Oral Daily PRN 07/17/14, NP   30 mL at 07/13/14 1605  . propranolol (INDERAL) tablet 20 mg  20 mg Oral BID 07/15/14, NP   20 mg at 07/15/14 0754  . traZODone (DESYREL) tablet  100 mg  100 mg Oral QHS Kristeen Mans, NP   100 mg at 07/14/14 2112    Lab Results:  No results found for this or any previous visit (from the past 48 hour(s)).  Physical Findings: AIMS: Facial and Oral Movements Muscles of Facial Expression: None, normal Lips and Perioral Area: None, normal Jaw: None, normal Tongue: None, normal,Extremity Movements Upper (arms, wrists, hands, fingers): None, normal Lower (legs, knees, ankles, toes): None, normal, Trunk Movements Neck, shoulders, hips: None, normal, Overall Severity Severity of abnormal movements (highest score from questions above): None,  normal Incapacitation due to abnormal movements: None, normal Patient's awareness of abnormal movements (rate only patient's report): No Awareness, Dental Status Current problems with teeth and/or dentures?: No Does patient usually wear dentures?: No  CIWA:    COWS:     Treatment Plan Summary: Daily contact with patient to assess and evaluate symptoms and progress in treatment Medication management  Plan: Continue with plan of care Continue crisis management Encourage to participate in group and individual sessions Continue medication management/ and review as needed Continue taking Divalproex 1000 mg po for mood stabilization Haldol 5 mg po bid for mood Benztropine 0.5 mg po bid for    Medication induced EPS Trazodone 100 mg po for sleep Discharge  Plan in progress Address health issues /V/S as needed    Medical Decision Making Problem Points:  Established problem, stable/improving (1) Data Points:  Review and summation of old records (2)  I certify that inpatient services furnished can reasonably be expected to improve the patient's condition.   Beau Fanny   FNP-BC 07/15/2014, 12:42 PM Agree with assessment and plan Madie Reno A. Dub Mikes, M.D.

## 2014-07-15 NOTE — [facility] Group Notes (Signed)
[facility] Group Notes: (Clinical Social Work)   07/15/2014      Type of Therapy:  Group Therapy   Participation Level:  Did Not Attend  - Was standing at the door when group was starting, but looked suspicious and angry.  Chose not to enter the room.   Ambrose Mantle, LCSW 07/15/2014, 11:32 AM

## 2014-07-15 NOTE — [facility] Group Notes (Signed)
0900 nursing orientation group   The focus of this group is to educate the patient on the purpose and policies of crisis stabilization and provide a format to answer questions about their admission.  The group details unit policies and expectations of patients while admitted.  Pt did attend minimal interactions and she was irritable.

## 2014-07-15 NOTE — [facility] Group Notes (Signed)
[facility] Group Notes:  (Nursing/MHT/Case Management/Adjunct)  Date:  07/15/2014  Time:  12:22 PM  Type of Therapy:  Psychoeducational Skills  Participation Level:  Minimal  Participation Quality:  Inattentive and Resistant  Affect:  Flat and Irritable  Cognitive:  Lacking  Insight:  Lacking and Limited  Engagement in Group:  Lacking and Limited  Modes of Intervention:  Activity, Discussion and Education  Summary of Progress/Problems: Pt did attend but she was irritable and resistant.    Jule Ser 07/15/2014, 12:22 PM

## 2014-07-15 NOTE — Progress Notes (Signed)
D. Pt has been in room for much of the evening, appears internally preoccupied and unable to participate in milieu activities this evening. Pt has slow thought process and appears to have some thought blocking. Pt did speak about discharge and did inquire if she was still a voluntary patient. Pt did receive all medications without incident. A. Support and encouragement provided. R. Safety maintained, will continue to monitor.

## 2014-07-15 NOTE — Progress Notes (Signed)
Pt has been up in her chair for most of the day. She has been up for groups.  She rated her depression and anxiety a 8 and denied any hopelessness on her self-inventory.  She denied any S/H ideation or A/V/H.  She wrote her goal "getting ready for discharge" which she stated,"by sitting in groups and by myself" She did talk about how she didn't like to be around a bunch of people when talking about herself or anything pertinent.

## 2014-07-16 DIAGNOSIS — F3130 Bipolar disorder, current episode depressed, mild or moderate severity, unspecified: Principal | ICD-10-CM

## 2014-07-16 NOTE — Progress Notes (Signed)
Patient ID: Natalie [name], female   DOB: Nov 06, 1991, 23 y.o.   MRN: [id number] [facility] MD Progress Note  07/16/2014 11:09 AM ARLYNN STARE  MRN:  [id number] Subjective: Patient states:" I am still anxious and worry a lot about my future. I do not like any of the medications but I guess I have to take them so that I can feel better.''  Objective:  Patient was seen and her chart was  reviewed. Patient reports ongoing anxiety, excessive worries, feeling edgy and having trouble concentrating. Patient continues to have delusional thinking regarding taking medications. However, she has made some improvement, her behavior is less bizarre and thought process is more organized. But she remains  guarded, evasive with decreased verbal output and stares into spaces for minutes as if preoccupied. She is socially isolated with minimal interaction with peers, she has not been participating fully in the unit milieu but she is compliant with her medications and denies adverse reactions.  Diagnosis:   DSM5: Schizophrenia Disorders:  NA Obsessive-Compulsive Disorders:  NA Trauma-Stressor Disorders:  NA Substance/Addictive Disorders:  NA Depressive Disorders:  Bipolar disorder, depressed type. Total Time spent with patient: 30 minutes  Axis I: Bipolar 1 disorder- Depressed Axis II: Deferred Axis III:  Past Medical History  Diagnosis Date  . Psychiatric diagnosis   . Chronic bipolar disorder   . OCD (obsessive compulsive disorder)   . H/O suicide attempt 05-2013    OD  . Anxiety   . Depression    Axis IV: housing problems, occupational problems, other psychosocial or environmental problems and problems related to social environment Axis V: 41-50 serious symptoms  ADL's:  Impaired  Sleep: Good  Appetite:  Good  Suicidal Ideation:  Denied Homicidal Ideation:  Denied  Psychiatric Specialty Exam: Physical Exam  ROS  Blood pressure 92/53, pulse 77, temperature 98 F (36.7 C), temperature  source Oral, resp. rate 16, height 5\' 4"  (1.626 m), weight 86.183 kg (190 lb).Body mass index is 32.6 kg/(m^2).  General Appearance: Casual  Eye Contact::  Fair  Speech:  Clear and Coherent  Volume:  Normal  Mood:  Depressed and Dysphoric  Affect:  Congruent, Constricted, Depressed and Flat  Thought Process:  Coherent  Orientation:  Full (Time, Place, and Person)  Thought Content:  WDL  Suicidal Thoughts:  No  Homicidal Thoughts:  No  Memory:  Immediate;   Good Recent;   Fair Remote;   Fair  Judgement:  Fair  Insight:  Fair  Psychomotor Activity:  Normal  Concentration:  Good  Recall:  NA  Fund of Knowledge:Fair  Language: Good  Akathisia:  NA  Handed:  Right  AIMS (if indicated):     Assets:  Desire for Improvement Housing  Sleep:  Number of Hours: 3   Musculoskeletal: Strength & Muscle Tone: within normal limits Gait & Station: normal Patient leans: N/A  Current Medications: Current Facility-Administered Medications  Medication Dose Route Frequency Provider Last Rate Last Dose  . acetaminophen (TYLENOL) tablet 650 mg  650 mg Oral Q6H PRN [id number], NP      . alum & mag hydroxide-simeth (MAALOX/MYLANTA) 200-200-20 MG/5ML suspension 30 mL  30 mL Oral Q4H PRN 06-08-2001, NP      . benztropine (COGENTIN) tablet 0.5 mg  0.5 mg Oral BID Kristeen Mans, NP   0.5 mg at 07/16/14 0816  . divalproex (DEPAKOTE) DR tablet 1,000 mg  1,000 mg Oral QHS 07/18/14, NP   1,000 mg at 07/15/14  2142  . haloperidol (HALDOL) tablet 5 mg  5 mg Oral BID Kristeen Mans, NP   5 mg at 07/16/14 0816  . magnesium hydroxide (MILK OF MAGNESIA) suspension 30 mL  30 mL Oral Daily PRN Kristeen Mans, NP   30 mL at 07/13/14 1605  . propranolol (INDERAL) tablet 20 mg  20 mg Oral BID Kristeen Mans, NP   20 mg at 07/16/14 0816  . traZODone (DESYREL) tablet 100 mg  100 mg Oral QHS Kristeen Mans, NP   100 mg at 07/15/14 2142    Lab Results:  No results found for this or any previous visit (from the  past 48 hour(s)).  Physical Findings: AIMS: Facial and Oral Movements Muscles of Facial Expression: None, normal Lips and Perioral Area: None, normal Jaw: None, normal Tongue: None, normal,Extremity Movements Upper (arms, wrists, hands, fingers): None, normal Lower (legs, knees, ankles, toes): None, normal, Trunk Movements Neck, shoulders, hips: None, normal, Overall Severity Severity of abnormal movements (highest score from questions above): None, normal Incapacitation due to abnormal movements: None, normal Patient's awareness of abnormal movements (rate only patient's report): No Awareness, Dental Status Current problems with teeth and/or dentures?: No Does patient usually wear dentures?: No  CIWA:    COWS:     Treatment Plan Summary: Daily contact with patient to assess and evaluate symptoms and progress in treatment Medication management  Plan: Continue with plan of care Continue crisis management Encourage to participate in group and individual sessions Continue medication management/ and review as needed Continue taking Divalproex 1000 mg po for mood stabilization Haldol 5 mg po bid for mood Benztropine 0.5 mg po bid for    Medication induced EPS Trazodone 100 mg po for sleep Discharge  Plan in progress Address health issues /V/S as needed    Medical Decision Making Problem Points:  Established problem, improving (1) Data Points:  Review and summation of old records (2)  I certify that inpatient services furnished can reasonably be expected to improve the patient's condition.   Thedore Mins, MD 07/16/2014, 11:09 AM

## 2014-07-16 NOTE — [facility] Group Notes (Signed)
[facility] LCSW Group Therapy  07/16/2014 1:15 pm  Type of Therapy: Process Group Therapy  Participation Level:  Active  Participation Quality:  Appropriate  Affect:  Flat  Cognitive:  Oriented  Insight:  Improving  Engagement in Group:  Limited  Engagement in Therapy:  Limited  Modes of Intervention:  Activity, Clarification, Education, Problem-solving and Support  Summary of Progress/Problems: Today's group addressed the issue of overcoming obstacles.  Patients were asked to identify their biggest obstacle post d/c that stands in the way of their on-going success, and then problem solve as to how to manage this.  Ivette was silent for the first 30 minutes of group.  When asked directly, she spoke more than any group I have been in with her.  Started out by talking about how her mood changes during the day.  "I start out fine, but then one little thing goes wrong, and my mood goes bad, and I can't turn it around."  Gave the example of hearing about another pt who is discharging, and angry that it is not her.  "I t feels like everyone is closer to d/c than me.  Another pt responded by saying his is afraid of discharging, and would prefer to stay her.  This surprised her and she backtracked, saying that she knows that are times when she is offered help and does not take advantage of it.  "I just don't feel comfortable sharing in front of others."  I pointed out that her sharing today was very brave, and a good first step.  Ida Rogue 07/16/2014   3:35 PM

## 2014-07-16 NOTE — Progress Notes (Signed)
Pt has a very flat ,blunted affect and questioned why she needed to take the medications at the time they were being given. Pt does isolate and spends most of the time in her room asleep. She denies SI and HI and contracts for safety.

## 2014-07-16 NOTE — [facility] Group Notes (Signed)
[facility] LCSW Aftercare Discharge Planning Group Note   07/16/2014 3:34 PM  Participation Quality:  Did not attend    [place]

## 2014-07-17 DIAGNOSIS — F429 Obsessive-compulsive disorder, unspecified: Secondary | ICD-10-CM

## 2014-07-17 MED ORDER — FLUOXETINE HCL 10 MG PO CAPS
10.0000 mg | ORAL_CAPSULE | Freq: Every day | ORAL | Status: DC
  Administered 2014-07-18 – 2014-07-20 (×3): 10 mg via ORAL
  Filled 2014-07-17 (×4): qty 1
  Filled 2014-07-17: qty 3
  Filled 2014-07-17 (×2): qty 1

## 2014-07-17 NOTE — Progress Notes (Signed)
Patient ID: Natalie [name], female   DOB: March 24, 1991, 24 y.o.   MRN: [id number] [facility] MD Progress Note  07/17/2014 10:28 AM Natalie [name]  MRN:  [id number] Subjective: Patient states:" I am feeling depressed and extremely anxious and worry  about my future. Also, I want you to put me in a different room, my roommate kept up all night, she was just giggling and laughing out loud.''  Objective:  Patient was seen and her chart was  reviewed. Patient reports feeling depressed, anxious, feeling edgy and having trouble concentrating. She denies auditory/visual hallucinations but continues to have delusional thinking regarding taking medications. Patient remains self isolated, evasive, guarded but her behavior is less bizarre and thought process is more organized. She has been  participating  in the unit milieu but partially compliant with her medications. She refused her medications this morning, so far she has not verbalized any adverse reactions to any of her medications.  Diagnosis:   DSM5: Schizophrenia Disorders:  NA Obsessive-Compulsive Disorders:  NA Trauma-Stressor Disorders:  NA Substance/Addictive Disorders:  NA Depressive Disorders:  Bipolar disorder, depressed type. Total Time spent with patient: 25 minutes  Axis I: Bipolar 1 disorder- Depressed            OCD-Obsessive compulsive disorder Axis II: Deferred Axis III:  Past Medical History  Diagnosis Date  . None reported    Axis IV: housing problems, occupational problems, other psychosocial or environmental problems and problems related to social environment Axis V: 41-50 serious symptoms  ADL's:  Impaired  Sleep: Good  Appetite:  Good  Suicidal Ideation:  Denied Homicidal Ideation:  Denied  Psychiatric Specialty Exam: Physical Exam  ROS  Blood pressure 96/60, pulse 74, temperature 98.5 F (36.9 C), temperature source Oral, resp. rate 18, height 5\' 4"  (1.626 m), weight 86.183 kg (190 lb).Body mass index is 32.6  kg/(m^2).  General Appearance: Casual  Eye Contact::  Fair  Speech:  Clear and Coherent  Volume:  Normal  Mood:  Depressed and Dysphoric  Affect:  Congruent, Constricted, Depressed and Flat  Thought Process:  Coherent  Orientation:  Full (Time, Place, and Person)  Thought Content:  WDL  Suicidal Thoughts:  No  Homicidal Thoughts:  No  Memory:  Immediate;   Good Recent;   Fair Remote;   Fair  Judgement:  Fair  Insight:  Fair  Psychomotor Activity:  Normal  Concentration:  Good  Recall:  NA  Fund of Knowledge:Fair  Language: Good  Akathisia:  NA  Handed:  Right  AIMS (if indicated):     Assets:  Desire for Improvement Housing  Sleep:  Number of Hours: 4.75   Musculoskeletal: Strength & Muscle Tone: within normal limits Gait & Station: normal Patient leans: N/A  Current Medications: Current Facility-Administered Medications  Medication Dose Route Frequency Provider Last Rate Last Dose  . acetaminophen (TYLENOL) tablet 650 mg  650 mg Oral Q6H PRN [id number], NP      . alum & mag hydroxide-simeth (MAALOX/MYLANTA) 200-200-20 MG/5ML suspension 30 mL  30 mL Oral Q4H PRN 06-08-2001, NP      . benztropine (COGENTIN) tablet 0.5 mg  0.5 mg Oral BID Kristeen Mans, NP   0.5 mg at 07/16/14 1659  . divalproex (DEPAKOTE) DR tablet 1,000 mg  1,000 mg Oral QHS 07/18/14, NP   1,000 mg at 07/16/14 2242  . FLUoxetine (PROZAC) capsule 10 mg  10 mg Oral Daily Chetara Kropp      .  haloperidol (HALDOL) tablet 5 mg  5 mg Oral BID Kristeen Mans, NP   5 mg at 07/16/14 1659  . magnesium hydroxide (MILK OF MAGNESIA) suspension 30 mL  30 mL Oral Daily PRN Kristeen Mans, NP   30 mL at 07/13/14 1605  . propranolol (INDERAL) tablet 20 mg  20 mg Oral BID Kristeen Mans, NP   20 mg at 07/16/14 1659  . traZODone (DESYREL) tablet 100 mg  100 mg Oral QHS Kristeen Mans, NP   100 mg at 07/16/14 2234    Lab Results:  No results found for this or any previous visit (from the past 48  hour(s)).  Physical Findings: AIMS: Facial and Oral Movements Muscles of Facial Expression: None, normal Lips and Perioral Area: None, normal Jaw: None, normal Tongue: None, normal,Extremity Movements Upper (arms, wrists, hands, fingers): None, normal Lower (legs, knees, ankles, toes): None, normal, Trunk Movements Neck, shoulders, hips: None, normal, Overall Severity Severity of abnormal movements (highest score from questions above): None, normal Incapacitation due to abnormal movements: None, normal Patient's awareness of abnormal movements (rate only patient's report): No Awareness, Dental Status Current problems with teeth and/or dentures?: No Does patient usually wear dentures?: No  CIWA:    COWS:     Treatment Plan Summary: Daily contact with patient to assess and evaluate symptoms and progress in treatment Medication management  Plan: Continue with plan of care Continue crisis management Encourage to participate in group and individual sessions Continue medication management/ and review as needed Continue Divalproex 1000 mg po for mood stabilization Haldol 5 mg po bid for mood Benztropine 0.5 mg po bid for    Medication induced EPS Initiate Prozac 10mg  po daily for depression/OCD Trazodone 100 mg po for sleep Discharge  Plan in progress Address health issues /V/S as needed    Medical Decision Making Problem Points:  Established problem, improving (1) Data Points:  Review and summation of old records (2)  I certify that inpatient services furnished can reasonably be expected to improve the patient's condition.   , MD 07/17/2014, 10:28 AM

## 2014-07-17 NOTE — Progress Notes (Signed)
Patient ID: Natalie [name], female   DOB: 1991/07/10, 23 y.o.   MRN: [id number] D-Patient has been sitting in her room all day staring down at the bed most of the time.  She has a very flat affect.  She refused to take her am meds and she declined to fill out her self inventory.  A- Asked patient if she would like to talk.  R- She stated "No".  Sat with patient and eventually patient said she wants to go home.  She  Says "My time here is very different than the last time I was here.  She mentioned "My spouse" and not hearing from him.  Agreed she feels lonely.  Patient is very soft-spoken and difficult to hear at times.   Expressing frustration with her roommate who is hypomanic and intrusive.  Patient reassured that a room change would be made as soon as a bed was available. Room change was made, but patient continues to sit and stare.  Her responses are delayed but seems to be more than thought blocking.

## 2014-07-17 NOTE — Progress Notes (Signed)
D Pt. Denies SI and HI,  Appears upset and states she does not understand why she is getting another roommate and why she was moved.  A Writer offered support and encouragement,  Explained the need for pt. To take her medication d/t patient had been refusing them today.  Also discussed our main goal is to keep the patient safe at [facility] and the move was  To benefit her.    R Pt. Remains safe on the unit, did agree to take her scheduled medications this pm after our discussion but still refused to take the prozac that was due at 10am stating she had not been taking it.

## 2014-07-17 NOTE — Progress Notes (Signed)
D: Pt presents flat in affect and sad in mood. Pt denied any SI/HI/AVH. Pt verbalized concerns over her medications being "manipulated". Pt was informed that her medications may be adjusted by her provider on the basis of her presenting progress of her symptoms. Pt was informed that her current medications have remained the same since 07/12/14. Pt receptive to the information. A: Writer administered scheduled medications to pt. Continued support and availability as needed was extended to this pt. Staff continue to monitor pt with q29min checks.  R: No adverse drug reactions noted. Pt receptive to treatment. Pt remains safe at this time.

## 2014-07-17 NOTE — Progress Notes (Signed)
The focus of this group is to educate the patient on the purpose and policies of crisis stabilization and provide a format to answer questions about their admission.  The group details unit policies and expectations of patients while admitted. Patient did not attend. 

## 2014-07-17 NOTE — [facility] Group Notes (Signed)
[facility] LCSW Group Therapy  07/17/2014 , 2:47 PM   Type of Therapy:  Group Therapy  Participation Level:  Active  Participation Quality:  Attentive  Affect:  Appropriate  Cognitive:  Alert  Insight:  Improving  Engagement in Therapy:  Engaged  Modes of Intervention:  Discussion, Exploration and Socialization  Summary of Progress/Problems: Today's group focused on the term Diagnosis.  Participants were asked to define the term, and then pronounce whether it is a negative, positive or neutral term.  Did not attend  Natalie [name] 07/17/2014 , 2:47 PM

## 2014-07-17 NOTE — Progress Notes (Signed)
Patient ID: Natalie [name], female   DOB: March 05, 1991, 23 y.o.   MRN: [id number] Patient did not fill out her self inventory despite being asked several times.

## 2014-07-17 NOTE — Tx Team (Signed)
  Interdisciplinary Treatment Plan Update   Date Reviewed:  07/17/2014  Time Reviewed:  2:48 PM  Progress in Treatment:   Attending groups: Yes Participating in groups: Yes Taking medication as prescribed: Yes  Tolerating medication: Yes Family/Significant other contact made: Yes  Patient understands diagnosis: Yes  Discussing patient identified problems/goals with staff: Yes Medical problems stabilized or resolved: Yes Denies suicidal/homicidal ideation: Yes Patient has not harmed self or others: Yes  For review of initial/current patient goals, please see plan of care.  Estimated Length of Stay:  3-4 days  Reason for Continuation of Hospitalization: Anxiety Depression Medication stabilization  New Problems/Goals identified:  N/A  Discharge Plan or Barriers:   return home, follow up outpt  Additional Comments:  " I am feeling depressed and extremely anxious and worry about my future. Also, I want you to put me in a different room, my roommate kept up all night, she was just giggling and laughing out loud.''  Patient reports feeling depressed, anxious, feeling edgy and having trouble concentrating. She denies auditory/visual hallucinations but continues to have delusional thinking regarding taking medications. Patient remains self isolated, evasive, guarded but her behavior is less bizarre and thought process is more organized. She has been participating in the unit milieu but partially compliant with her medications. She refused her medications this morning.  No med changes today.   Attendees:  Signature: Thedore Mins, MD 07/17/2014 2:48 PM   Signature: Richelle Ito, LCSW 07/17/2014 2:48 PM  Signature: Fransisca Kaufmann, NP 07/17/2014 2:48 PM  Signature: Joslyn Devon, RN 07/17/2014 2:48 PM  Signature: Liborio Nixon, RN 07/17/2014 2:48 PM  Signature:  07/17/2014 2:48 PM  Signature:   07/17/2014 2:48 PM  Signature:    Signature:    Signature:    Signature:    Signature:     Signature:      Scribe for Treatment Team:   Richelle Ito, LCSW  07/17/2014 2:48 PM

## 2014-07-18 NOTE — Progress Notes (Signed)
D: Pt denies SI/HI/AVH. Pt is pleasant and cooperative. Pt stated she would like to live in a Assisted living facility with her Husband.   A: Pt was offered support and encouragement. Pt was given scheduled medications. Pt was encourage to attend groups. Q 15 minute checks were done for safety.   R:Pt attends groups and interacts well with peers and staff. Pt is taking medication. Pt receptive to treatment and safety maintained on unit.

## 2014-07-18 NOTE — [facility] Group Notes (Signed)
Adult Psychoeducational Group Note  Date:  07/18/2014 Time:  9:51 PM  Group Topic/Focus:  Wrap-Up Group:   The focus of this group is to help patients review their daily goal of treatment and discuss progress on daily workbooks.  Participation Level:  Did Not Attend  Participation Quality:  None  Affect:  None  Cognitive:  None  Insight: None  Engagement in Group:  None  Modes of Intervention:  Discussion  Additional Comments:  Juliana did not attend group.  Caroll Rancher A 07/18/2014, 9:51 PM
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
## 2014-07-18 NOTE — [facility] Group Notes (Signed)
[facility] LCSW Aftercare Discharge Planning Group Note   07/18/2014 10:04 AM  Participation Quality:  Invited,  Chose to not attend    Natalie [name]

## 2014-07-18 NOTE — Progress Notes (Signed)
D Pt. Denies SI and HI, no complaints of pain or discomfort noted.  A writer offered support and encouragement,  Discussed coping skills with pt.  R Pt. Remains safe on the unit.  Pt. Did respond to writer today, much improvement from prior days.  Pt. Is taking her medication as prescribed and hoping to be discharged with her spouse to an assisted living facility.

## 2014-07-18 NOTE — Progress Notes (Signed)
Pt has a very flat, blunted affect. She came to the med window late ,took her medications and then stated,"I will spend the day in my room." Pt isolates and seldom speaks.She appeared neatly dressed this am with make up on and a new shirt. Pt denies Aud or visual hallucinations and contracts for safety. Pt would like to live in an assisted living facility with her husband. Social worker made aware of this. Pt contracts for safety and denies Si and HI,.

## 2014-07-18 NOTE — Progress Notes (Signed)
Patient ID: Natalie [name], female   DOB: 11/02/1991, 23 y.o.   MRN: [id number] [facility] MD Progress Note  07/18/2014 10:25 AM Natalie [name]  MRN:  [id number] Subjective: Patient states:" I don't feel good, I am still depressed,  anxious and worrying  about my future. My mother does not want me to come back home. I want you to find a group home for me and my husband.''  Objective:  Patient was seen and her chart was  reviewed. Patient reports ongoing depressive symptoms, low energy level, lack of motivation, feeling edgy, trouble concentrating and feeling anxious. She denies auditory/visual hallucinations or suicidal ideations. However, she continues to have delusional thinking regarding taking medications, thinks medications are poisoning her body. Patient remains socially isolated, evasive, guarded but her behavior is less bizarre and thought process is more organized. She has been  participating  in the unit milieu but partially compliant with her medications. So far she has not verbalized any adverse reactions to any of her medications.  Diagnosis:   DSM5: Schizophrenia Disorders:  NA Obsessive-Compulsive Disorders:  NA Trauma-Stressor Disorders:  NA Substance/Addictive Disorders:  NA Depressive Disorders:  Bipolar disorder, depressed type. Total Time spent with patient: 25 minutes  Axis I: Bipolar 1 disorder- Depressed            OCD-Obsessive compulsive disorder Axis II: Deferred Axis III:  Past Medical History  Diagnosis Date  . None reported    Axis IV: housing problems, occupational problems, other psychosocial or environmental problems and problems related to social environment Axis V: 41-50 serious symptoms  ADL's:  Impaired  Sleep: Good  Appetite:  Good  Suicidal Ideation:  Denied Homicidal Ideation:  Denied  Psychiatric Specialty Exam: Physical Exam  Psychiatric: Her speech is normal. Judgment normal. Her affect is blunt. She is slowed and withdrawn. Thought  content is paranoid. Cognition and memory are normal.    Review of Systems  Constitutional: Negative.   HENT: Negative.   Eyes: Negative.   Respiratory: Negative.   Cardiovascular: Negative.   Gastrointestinal: Negative.   Genitourinary: Negative.   Musculoskeletal: Negative.   Skin: Negative.   Neurological: Negative.   Endo/Heme/Allergies: Negative.   Psychiatric/Behavioral: Positive for depression. The patient is nervous/anxious.     Blood pressure 122/70, pulse 65, temperature 98.5 F (36.9 C), temperature source Oral, resp. rate 17, height 5\' 4"  (1.626 m), weight 86.183 kg (190 lb).Body mass index is 32.6 kg/(m^2).  General Appearance: Casual  Eye Contact::  Fair  Speech:  Clear and Coherent  Volume:  Normal  Mood:  Depressed and Dysphoric  Affect:  Congruent, Constricted, Depressed and Flat  Thought Process:  Coherent  Orientation:  Full (Time, Place, and Person)  Thought Content:  WDL  Suicidal Thoughts:  No  Homicidal Thoughts:  No  Memory:  Immediate;   Good Recent;   Fair Remote;   Fair  Judgement:  Fair  Insight:  Fair  Psychomotor Activity:  Normal  Concentration:  Good  Recall:  NA  Fund of Knowledge:Fair  Language: Good  Akathisia:  NA  Handed:  Right  AIMS (if indicated):     Assets:  Desire for Improvement Housing  Sleep:  Number of Hours: 6.25   Musculoskeletal: Strength & Muscle Tone: within normal limits Gait & Station: normal Patient leans: N/A  Current Medications: Current Facility-Administered Medications  Medication Dose Route Frequency Provider Last Rate Last Dose  . acetaminophen (TYLENOL) tablet 650 mg  650 mg Oral Q6H PRN [id number],  NP      . alum & mag hydroxide-simeth (MAALOX/MYLANTA) 200-200-20 MG/5ML suspension 30 mL  30 mL Oral Q4H PRN Kristeen Mans, NP      . benztropine (COGENTIN) tablet 0.5 mg  0.5 mg Oral BID Kristeen Mans, NP   0.5 mg at 07/18/14 0926  . divalproex (DEPAKOTE) DR tablet 1,000 mg  1,000 mg Oral QHS Kristeen Mans, NP   1,000 mg at 07/17/14 2146  . FLUoxetine (PROZAC) capsule 10 mg  10 mg Oral Daily Kishan Wachsmuth   10 mg at 07/18/14 9811  . haloperidol (HALDOL) tablet 5 mg  5 mg Oral BID Kristeen Mans, NP   5 mg at 07/18/14 0926  . magnesium hydroxide (MILK OF MAGNESIA) suspension 30 mL  30 mL Oral Daily PRN Kristeen Mans, NP   30 mL at 07/13/14 1605  . propranolol (INDERAL) tablet 20 mg  20 mg Oral BID Kristeen Mans, NP   20 mg at 07/18/14 9147  . traZODone (DESYREL) tablet 100 mg  100 mg Oral QHS Kristeen Mans, NP   100 mg at 07/17/14 2146    Lab Results:  No results found for this or any previous visit (from the past 48 hour(s)).  Physical Findings: AIMS: Facial and Oral Movements Muscles of Facial Expression: None, normal Lips and Perioral Area: None, normal Jaw: None, normal Tongue: None, normal,Extremity Movements Upper (arms, wrists, hands, fingers): None, normal Lower (legs, knees, ankles, toes): None, normal, Trunk Movements Neck, shoulders, hips: None, normal, Overall Severity Severity of abnormal movements (highest score from questions above): None, normal Incapacitation due to abnormal movements: None, normal Patient's awareness of abnormal movements (rate only patient's report): No Awareness, Dental Status Current problems with teeth and/or dentures?: No Does patient usually wear dentures?: No  CIWA:    COWS:     Treatment Plan Summary: Daily contact with patient to assess and evaluate symptoms and progress in treatment Medication management  Plan: Continue with plan of care Continue crisis management Encourage to participate in group and individual sessions Continue medication management/ and review as needed Continue Divalproex 1000 mg po for mood stabilization Haldol 5 mg po bid for mood Benztropine 0.5 mg po bid for    Medication induced EPS Increase Prozac to 20mg  po daily for depression/OCD Trazodone 100 mg po for sleep Discharge  Plan in progress Address  health issues /V/S as needed    Medical Decision Making Problem Points:  Established problem, improving (1) Data Points:  Review and summation of old records (2)  I certify that inpatient services furnished can reasonably be expected to improve the patient's condition.   , MD 07/18/2014, 10:25 AM

## 2014-07-18 NOTE — [facility] Group Notes (Signed)
[facility] [employer] Group Therapy  07/18/2014 , 1:00 PM    Type of Therapy:  [employer] Presentation  Participation Level:  Invited.  Chose to not attend  Summary of Progress/Problems:  Onalee Hua from [employer] came to present his recovery story and play the guitar.    Daryel Gerald B 07/18/2014 , 1:00 PM

## 2014-07-19 NOTE — Progress Notes (Signed)
Patient ID: MANAHIL VANZILE, female   DOB: 07/21/1991, 23 y.o.   MRN: [id number] CSW attempted to contact [name] CST Team Lead, Sheppard Evens, [phone], and left a voicemail asking that Mrs. Orie Fisherman return the call at her earliest convenience.    CSW contacted [name] mother, [name], to inform her of [name] decision to return to her mother's home.  Mrs. Renard Hamper is agreeable to the plan and reports that she will provide transportation at discharge.   Chad Cordial, LCSWA 07/19/2014 3:19 PM

## 2014-07-19 NOTE — Progress Notes (Signed)
D:Pt is guarded and paranoid during interaction with this Clinical research associate. Pt reports that the walls are listening and speaks in a low/soft voice. Pt reports that she wants to go home. She did not attend morning group. She was reluctant to take morning medications and required much encouragement. A:Offered support, encouragement and 15 minute checks. Gave medications as ordered. R:Pt denies si and hi. Safety maintained on the unit.

## 2014-07-19 NOTE — Progress Notes (Signed)
Patient ID: Natalie [name], female   DOB: 08/21/1991, 22 y.o.   MRN: [id number] D: Patient reports her day has been good till she learned about her discharge for Friday. Pt states feeling anxious about where she will be living. Pt reports she is not allowed to live with mother in law and has no where to go. Pt mood and affect is anxious. Pt denies SI/HI/AVH and pain.  Cooperative with assessment. No acute distressed noted at this time.   A: Met with pt 1:1. Medications administered as prescribed. Writer encouraged pt to discuss feelings. Pt encouraged to come to staff with any question or concerns.   R: Patient remains safe. She is complaint with medications and denies any adverse reaction. Pt states she will talk to social worker tomorrow.  Continue current POC.  

## 2014-07-19 NOTE — [facility] Group Notes (Signed)
[facility] Group Notes:  (Nursing/MHT/Case Management/Adjunct)  Date:  07/19/2014  Time:  11:25 AM  Type of Therapy:  Group Therapy/goals group/leisure and lifestyle changes  Participation Level:  Did Not Attend  Participation Quality:    Affect:    Cognitive:    Insight:    Engagement in Group:    Modes of Intervention:    Summary of Progress/Problems:  Natalie [name] 07/19/2014, 11:25 AM

## 2014-07-19 NOTE — Progress Notes (Signed)
Patient ID: Natalie [name], female   DOB: 1991-10-15, 23 y.o.   MRN: [id number] CSW spoke with [name] husband, Omaria Plunk, with Pt present to inquire about possible leads for [name] living arrangements at discharge.  Mr. Battaglia reported that he had exhausted his options and did not have anywhere for Pt to stay.  CSW spoke with [name] mother, [name], [phone], with Pt present. Mrs. Renard Hamper reported that she is agreeable to giving Pt "one more chance" to come back home if Pt is willing to commit to "putting forth effort to help fight her depression."  Mrs. Renard Hamper voiced her concern about the dangerous suicidal behavior previously exhibited by Pt prior to admission.  Pt did not respond during conversation but was agreeable to making a decision between going back to her mother's house or going to a shelter by 1:00pm today.    [name] mother also reported that Pt was receiving CST services through an agency called [facility] and the team lead is [place], [phone].  CSW will follow-up.  Chad Cordial, LCSWA 07/19/2014 11:11 AM

## 2014-07-19 NOTE — Progress Notes (Signed)
Per MD ok to hold 0800 dose of Inderal due to pt's blood pressure being low.

## 2014-07-19 NOTE — Progress Notes (Signed)
D: Pt denies SI/HI/AVH. Pt is pleasant and cooperative. Pt stated she is ready to go home, but anxious about staying with her mother, because she is going to be watched more closely.   A: Pt was offered support and encouragement. Pt was given scheduled medications. Pt was encourage to attend groups. Q 15 minute checks were done for safety.   R:Pt attends groups and interacts well with peers and staff. Pt is taking medication. Pt has no complaints at this time .Pt receptive to treatment and safety maintained on unit.

## 2014-07-19 NOTE — Progress Notes (Signed)
Patient ID: Natalie [name], female   DOB: July 23, 1991, 23 y.o.   MRN: [id number] [facility] MD Progress Note  07/19/2014 10:10 AM Natalie [name]  MRN:  [id number] Subjective: Patient states:" I am worried about where I will go upon my discharge tomorrow. I am not allowed to go back home to my mother and I cannot live with my husband.''  Objective:  Patient was seen and her chart was reviewed. Patient reports decreased anxiety, delusional thinking and depressive symptoms but worries about her discharge. She denies auditory/visual hallucinations or suicidal ideations. Patient remains socially isolated, evasive, guarded but her behavior is less bizarre and thought process is more organized. She has been  participating  in the unit milieu and compliant with her medications. So far, she has not verbalized any adverse reactions to any of her medications.  Diagnosis:   DSM5: Schizophrenia Disorders:  NA Obsessive-Compulsive Disorders:  NA Trauma-Stressor Disorders:  NA Substance/Addictive Disorders:  NA Depressive Disorders:  Bipolar disorder, depressed type. Total Time spent with patient: 25 minutes  Axis I: Bipolar 1 disorder- Depressed            OCD-Obsessive compulsive disorder Axis II: Deferred Axis III:  Past Medical History  Diagnosis Date  . None reported    Axis IV: housing problems, occupational problems, other psychosocial or environmental problems and problems related to social environment Axis V: 50-60 moderate symptoms  ADL's:  fair  Sleep: Good  Appetite:  Good  Suicidal Ideation:  Denied Homicidal Ideation:  Denied  Psychiatric Specialty Exam: Physical Exam  Psychiatric: Her speech is normal. Judgment normal. Her affect is blunt. She is slowed and withdrawn. Thought content is paranoid. Cognition and memory are normal.    Review of Systems  Constitutional: Negative.   HENT: Negative.   Eyes: Negative.   Respiratory: Negative.   Cardiovascular: Negative.    Gastrointestinal: Negative.   Genitourinary: Negative.   Musculoskeletal: Negative.   Skin: Negative.   Neurological: Negative.   Endo/Heme/Allergies: Negative.   Psychiatric/Behavioral: Positive for depression. The patient is nervous/anxious.     Blood pressure 97/59, pulse 68, temperature 97.9 F (36.6 C), temperature source Oral, resp. rate 16, height 5\' 4"  (1.626 m), weight 86.183 kg (190 lb).Body mass index is 32.6 kg/(m^2).  General Appearance: Casual  Eye Contact::  Fair  Speech:  Clear and Coherent  Volume:  Normal  Mood: Dysphoric  Affect: Constricted  Thought Process:  Coherent  Orientation:  Full (Time, Place, and Person)  Thought Content:  WDL  Suicidal Thoughts:  No  Homicidal Thoughts:  No  Memory:  Immediate;   Good Recent;   Fair Remote;   Fair  Judgement:  Fair  Insight:  Fair  Psychomotor Activity:  Normal  Concentration:  Good  Recall:  NA  Fund of Knowledge:Fair  Language: Good  Akathisia:  NA  Handed:  Right  AIMS (if indicated):     Assets:  Desire for Improvement Housing  Sleep:  Number of Hours: 3   Musculoskeletal: Strength & Muscle Tone: within normal limits Gait & Station: normal Patient leans: N/A  Current Medications: Current Facility-Administered Medications  Medication Dose Route Frequency Provider Last Rate Last Dose  . acetaminophen (TYLENOL) tablet 650 mg  650 mg Oral Q6H PRN [id number], NP      . alum & mag hydroxide-simeth (MAALOX/MYLANTA) 200-200-20 MG/5ML suspension 30 mL  30 mL Oral Q4H PRN 06-08-2001, NP      . benztropine (COGENTIN) tablet 0.5 mg  0.5 mg Oral BID Kristeen Mans, NP   0.5 mg at 07/19/14 7062  . divalproex (DEPAKOTE) DR tablet 1,000 mg  1,000 mg Oral QHS Kristeen Mans, NP   1,000 mg at 07/18/14 2118  . FLUoxetine (PROZAC) capsule 10 mg  10 mg Oral Daily Cressida Milford   10 mg at 07/19/14 0807  . haloperidol (HALDOL) tablet 5 mg  5 mg Oral BID Kristeen Mans, NP   5 mg at 07/19/14 3762  . magnesium  hydroxide (MILK OF MAGNESIA) suspension 30 mL  30 mL Oral Daily PRN Kristeen Mans, NP   30 mL at 07/13/14 1605  . propranolol (INDERAL) tablet 20 mg  20 mg Oral BID Kristeen Mans, NP   20 mg at 07/18/14 1638  . traZODone (DESYREL) tablet 100 mg  100 mg Oral QHS Kristeen Mans, NP   100 mg at 07/18/14 2119    Lab Results:  No results found for this or any previous visit (from the past 48 hour(s)).  Physical Findings: AIMS: Facial and Oral Movements Muscles of Facial Expression: None, normal Lips and Perioral Area: None, normal Jaw: None, normal Tongue: None, normal,Extremity Movements Upper (arms, wrists, hands, fingers): None, normal Lower (legs, knees, ankles, toes): None, normal, Trunk Movements Neck, shoulders, hips: None, normal, Overall Severity Severity of abnormal movements (highest score from questions above): None, normal Incapacitation due to abnormal movements: None, normal Patient's awareness of abnormal movements (rate only patient's report): No Awareness, Dental Status Current problems with teeth and/or dentures?: No Does patient usually wear dentures?: No  CIWA:    COWS:     Treatment Plan Summary: Daily contact with patient to assess and evaluate symptoms and progress in treatment Medication management  Plan: Continue with plan of care Continue crisis management Encourage to participate in group and individual sessions Continue medication management/ and review as needed Continue Divalproex 1000 mg po for mood stabilization Haldol 5 mg po bid for mood Benztropine 0.5 mg po bid for    Medication induced EPS Continue Prozac to 20mg  po daily for depression/OCD Trazodone 100 mg po for sleep Discharge  Plan in progress Address health issues /V/S as needed    Medical Decision Making Problem Points:  Established problem, improving (1) Data Points:  Review and summation of old records (2)  I certify that inpatient services furnished can reasonably be expected to  improve the patient's condition.   , MD 07/19/2014, 10:10 AM

## 2014-07-19 NOTE — [facility] Group Notes (Signed)
[facility] LCSW Group Therapy  07/19/2014 3:15 PM   Type of Therapy:  Group Therapy  Participation Level: Did not attend  Summary of Progress/Problems: Today's group focused on relapse prevention.  We defined the term, and then brainstormed on ways to prevent relapse.  Daryel Gerald B 07/19/2014 , 3:15 PM

## 2014-07-19 NOTE — [facility] Suicide Risk Assessment (Signed)
[facility] INPATIENT:  Family/Significant Other Suicide Prevention Education  Suicide Prevention Education:  Education Completed; [name], mother, has been identified by the patient as the family member/significant other with whom the patient will be residing, and identified as the person(s) who will aid the patient in the event of a mental health crisis (suicidal ideations/suicide attempt).  With written consent from the patient, the family member/significant other has been provided the following suicide prevention education, prior to the and/or following the discharge of the patient.  [name] mother verbalized understanding of material that has been previously presented to her during [name] prior admissions.    [name] 07/19/2014, 11:12 AM

## 2014-07-19 NOTE — Tx Team (Signed)
  Interdisciplinary Treatment Plan Update   Date Reviewed:  07/19/2014  Time Reviewed:  10:51 AM  Progress in Treatment:   Attending groups: Yes Participating in groups: Yes Taking medication as prescribed: Yes  Tolerating medication: Yes Family/Significant other contact made: Yes  Patient understands diagnosis: Yes  Discussing patient identified problems/goals with staff: Yes Medical problems stabilized or resolved: Yes Denies suicidal/homicidal ideation: Yes Patient has not harmed self or others: Yes  For review of initial/current patient goals, please see plan of care.  Estimated Length of Stay:  D/C tomorrow  Reason for Continuation of Hospitalization:   New Problems/Goals identified:  N/A  Discharge Plan or Barriers:   return home, follow up outpt  Additional Comments:  Attendees:  Signature: Thedore Mins, MD 07/19/2014 10:51 AM   Signature: Richelle Ito, LCSW 07/19/2014 10:51 AM  Signature: Fransisca Kaufmann, NP 07/19/2014 10:51 AM  Signature: Joslyn Devon, RN 07/19/2014 10:51 AM  Signature: Liborio Nixon, RN 07/19/2014 10:51 AM  Signature:  07/19/2014 10:51 AM  Signature:   07/19/2014 10:51 AM  Signature:    Signature:    Signature:    Signature:    Signature:    Signature:      Scribe for Treatment Team:   Richelle Ito, LCSW  07/19/2014 10:51 AM

## 2014-07-20 MED ORDER — ADULT MULTIVITAMIN W/MINERALS CH: 1.0000 | ORAL_TABLET | Freq: Every day | ORAL | Status: DC

## 2014-07-20 MED ORDER — FLUOXETINE HCL 10 MG PO CAPS: 10.0000 mg | ORAL_CAPSULE | Freq: Every day | ORAL | Status: DC

## 2014-07-20 MED ORDER — DIVALPROEX SODIUM 500 MG PO DR TAB: 1000.0000 mg | DELAYED_RELEASE_TABLET | Freq: Every day | ORAL | Status: DC

## 2014-07-20 MED ORDER — PROPRANOLOL HCL 20 MG PO TABS: 20.0000 mg | ORAL_TABLET | Freq: Two times a day (BID) | ORAL | Status: DC

## 2014-07-20 MED ORDER — HALOPERIDOL 5 MG PO TABS: 5.0000 mg | ORAL_TABLET | Freq: Two times a day (BID) | ORAL | Status: DC

## 2014-07-20 MED ORDER — BENZTROPINE MESYLATE 0.5 MG PO TABS: 0.5000 mg | ORAL_TABLET | Freq: Two times a day (BID) | ORAL | Status: DC

## 2014-07-20 MED ORDER — TRAZODONE HCL 100 MG PO TABS: 100.0000 mg | ORAL_TABLET | Freq: Every day | ORAL | Status: DC

## 2014-07-20 NOTE — Progress Notes (Signed)
[facility] Adult Case Management Discharge Plan :  Will you be returning to the same living situation after discharge: Yes,  Pt will return to mother's house At discharge, do you have transportation home?:Yes,  mother to provide transportation. Do you have the ability to pay for your medications:Yes,  Pt provided with a 14-day supply of medications and given a 30-day prescription  Release of information consent forms completed and in the chart;  Patient's signature needed at discharge.  Patient to Follow up at: Follow-up Information   Follow up with [facility]. (the hospital [facility], Vikki Ports, will be contacting you to let you know of your appointment date.  If you do not hear from Vikki Ports, please walk-in at [facility] anytime Monday-Friday from 8am-3pm. )    Specialty:  [facility] information:   [address] [place] [address] [phone]       Follow up with [facility]. (. Please follow-up with your CST staff for further services.)    Contact information:   [address] [place], [place] [address] [phone]      Patient denies SI/HI:   Yes,  Pt denies    Safety Planning and Suicide Prevention discussed:  Yes,  with mother.  See SPE note for further details  [name] 07/20/2014, 2:07 PM

## 2014-07-20 NOTE — Progress Notes (Signed)
Adult Psychoeducational Group Note  Date:  07/20/2014 Time:  12:47 AM  Group Topic/Focus:  Wrap-Up Group:   The focus of this group is to help patients review their daily goal of treatment and discuss progress on daily workbooks.  Participation Level:  Minimal  Participation Quality:  Inattentive  Affect:  Blunted and Flat  Cognitive:  Appropriate  Insight: Limited  Engagement in Group:  Limited  Modes of Intervention:  Education  Additional Comments:  Patient stated she would rate today a 4 out of 10 because today was not that good of a day because it has been the same the past couple days and patient is concerned she's not making progress. Patient stated one positive thing that happened today was that the doctor had stated talking about discharge.  Natalie [name] 07/20/2014, 12:47 AM

## 2014-07-20 NOTE — Discharge Summary (Signed)
Physician Discharge Summary Note  Patient:  Natalie [name] is an 23 y.o., female MRN:  [id number] DOB:  11/19/1991 Patient phone:  [phone] (home)  Patient address:   [address] [place] [address],  Total Time spent with patient: 20 minutes  Date of Admission:  07/12/2014 Date of Discharge: 07/20/14  Reason for Admission:  Psychosis   Discharge Diagnoses: Principal Problem:   Bipolar 1 disorder, depressed Active Problems:   OCD (obsessive compulsive disorder)   Psychiatric Specialty Exam: Physical Exam  Psychiatric: She has a normal mood and affect. Her speech is normal and behavior is normal. Judgment and thought content normal. Cognition and memory are normal.    Review of Systems  Constitutional: Negative.   HENT: Negative.   Eyes: Negative.   Respiratory: Negative.   Cardiovascular: Negative.   Gastrointestinal: Negative.   Genitourinary: Negative.   Musculoskeletal: Negative.   Skin: Negative.   Neurological: Negative.   Endo/Heme/Allergies: Negative.   Psychiatric/Behavioral: Negative.     Blood pressure 98/58, pulse 78, temperature 98.4 F (36.9 C), temperature source Oral, resp. rate 16, height 5\' 4"  (1.626 m), weight 86.183 kg (190 lb).Body mass index is 32.6 kg/(m^2).   Past Psychiatric History: See H&P Diagnosis:  Hospitalizations:  Outpatient Care:  Substance Abuse Care:  Self-Mutilation:  Suicidal Attempts:  Violent Behaviors:   Musculoskeletal: Strength & Muscle Tone: within normal limits Gait & Station: normal Patient leans: N/A  DSM5: AXIS I: Bipolar 1 disorder, depressed  AXIS II: Cluster B Traits  AXIS III:  Past Medical History   Diagnosis  Date   .  None reported    AXIS IV: other psychosocial or environmental problems, problems related to social environment and problems with primary support group  AXIS V: 61-70 mild symptoms   Level of Care:  OP  Hospital Course:   Natalie [name] is a 23 y.o. female who  voluntarily presents to [facility] with Suicidal ideation and worsening depression. Patient reports ongoing suicidal thoughts with plan to cut her wrists, stating that she was worried about what her family thinks when they saw her with knife in her hand. Patient reports stressors triggering her current emotional state: family issues/conflict and worried about marital issue--"I been separated for awhile now". Patient had attempted suicide by overdose on multiple occasions since her teenage years. Pt states that she has not been compliant  with her meds and says her last intake was over two weeks ago.          Natalie [name] was admitted to the adult 400 unit where she was evaluated and her symptoms were identified. Medication management was discussed and implemented. Patient was restarted on Depakote 1,000 mg hs for mood lability, Haldol 5 mg BID for psychosis, Inderal 20 mg BID for anxiety, and Trazodone 100 mg hs for insomnia. These medications had been initiated during a previous admission this year. Patient was started on Prozac 10 mg daily for treatment of depression. She was encouraged to participate in unit programming.  Medical problems were identified and treated appropriately. Home medication was restarted as needed. She was evaluated each day by a clinical provider to ascertain the patient's response to treatment.  Improvement was noted by the patient's report of decreasing symptoms, improved sleep and appetite, affect, medication tolerance, behavior, and participation in unit programming. The patient is documented to not have regularly attended groups during her admission. She was socially isolated and guarded.  The patient was asked each day to complete a  self inventory noting mood, mental status, pain, new symptoms, anxiety and concerns.         She responded well to medication and being in a therapeutic and supportive environment. Patient made slow but steady progress in her treatment.  Patient  expressed paranoia during interaction with staff at times. She told staff that the walls were listening to her. Patient required encouragement to take medications due to paranoia. Positive and appropriate behavior was noted and the patient was motivated for recovery.  She worked closely with the treatment team and case manager to develop a discharge plan with appropriate goals. Coping skills, problem solving as well as relaxation therapies were also part of the unit programming.         By the day of discharge she was in much improved condition than upon admission.  Symptoms were reported as significantly decreased or resolved completely.  The patient denied SI/HI and voiced no AVH. She was motivated to continue taking medication with a goal of continued improvement in mental health. CARLOYN LAHUE was discharged home with a plan to follow up as noted below.  Consults:  None  Significant Diagnostic Studies:  Chemistry, CBC, Depakote level, UDS negative   Discharge Vitals:   Blood pressure 98/58, pulse 78, temperature 98.4 F (36.9 C), temperature source Oral, resp. rate 16, height 5\' 4"  (1.626 m), weight 86.183 kg (190 lb). Body mass index is 32.6 kg/(m^2). Lab Results:   No results found for this or any previous visit (from the past 72 hour(s)).  Physical Findings: AIMS: Facial and Oral Movements Muscles of Facial Expression: None, normal Lips and Perioral Area: None, normal Jaw: None, normal Tongue: None, normal,Extremity Movements Upper (arms, wrists, hands, fingers): None, normal Lower (legs, knees, ankles, toes): None, normal, Trunk Movements Neck, shoulders, hips: None, normal, Overall Severity Severity of abnormal movements (highest score from questions above): None, normal Incapacitation due to abnormal movements: None, normal Patient's awareness of abnormal movements (rate only patient's report): No Awareness, Dental Status Current problems with teeth and/or dentures?:  No Does patient usually wear dentures?: No  CIWA:    COWS:     Psychiatric Specialty Exam: See Psychiatric Specialty Exam and Suicide Risk Assessment completed by Attending Physician prior to discharge.  Discharge destination:  Home  Is patient on multiple antipsychotic therapies at discharge:  No   Has Patient had three or more failed trials of antipsychotic monotherapy by history:  No  Recommended Plan for Multiple Antipsychotic Therapies: NA     Medication List       Indication   benztropine 0.5 MG tablet  Commonly known as:  COGENTIN  Take 1 tablet (0.5 mg total) by mouth 2 (two) times daily.   Indication:  Extrapyramidal Reaction caused by Medications     divalproex 500 MG DR tablet  Commonly known as:  DEPAKOTE  Take 2 tablets (1,000 mg total) by mouth at bedtime.   Indication:  Rapidly Alternating Manic-Depressive Psychosis     FLUoxetine 10 MG capsule  Commonly known as:  PROZAC  Take 1 capsule (10 mg total) by mouth daily.   Indication:  Depressive Phase of Manic-Depression     haloperidol 5 MG tablet  Commonly known as:  HALDOL  Take 1 tablet (5 mg total) by mouth 2 (two) times daily.   Indication:  Psychosis     multivitamin with minerals Tabs tablet  Take 1 tablet by mouth daily. May purchase over the counter.   Indication:  Vitamin Supplementation  propranolol 20 MG tablet  Commonly known as:  INDERAL  Take 1 tablet (20 mg total) by mouth 2 (two) times daily.   Indication:  anxiety/tachycardia     STOOL SOFTENER PO  Take 1 tablet by mouth daily as needed (constipation).      traZODone 100 MG tablet  Commonly known as:  DESYREL  Take 1 tablet (100 mg total) by mouth at bedtime.   Indication:  Trouble Sleeping       Follow-up Information   Follow up with [facility]. (the hospital [facility], Vikki Ports, will be contacting you to let you know of your appointment date.  If you do not hear from Vikki Ports, please walk-in at [facility] anytime  Monday-Friday from 8am-3pm. )    Specialty:  [facility] information:   [address] [place] [address] [phone]       Follow up with [facility]. (. Please follow-up with your CST staff for further services.)    Contact information:   [address], [place] [address] [phone]      Follow-up recommendations:   Activity: as tolerated  Diet: routine  Tests: Valproic acid level: 79.2  Other: patient to keep her after care appointment  Comments:   Take all your medications as prescribed by your mental healthcare provider.  Report any adverse effects and or reactions from your medicines to your outpatient provider promptly.  Patient is instructed and cautioned to not engage in alcohol and or illegal drug use while on prescription medicines.  In the event of worsening symptoms, patient is instructed to call the crisis hotline, 911 and or go to the nearest ED for appropriate evaluation and treatment of symptoms.  Follow-up with your primary care provider for your other medical issues, concerns and or health care needs.   Total Discharge Time:  Greater than 30 minutes.  SignedFransisca Kaufmann NP-C 07/20/2014, 10:09 AM   Patient seen, evaluated and I agree with notes by Nurse Practitioner. Thedore Mins, MD

## 2014-07-20 NOTE — BHH Group Notes (Signed)
[facility] LCSW Aftercare Discharge Planning Group Note  07/20/2014 8:45 AM  Did not attend  Chad Cordial, LCSWA 07/20/2014 11:15 AM

## 2014-07-20 NOTE — Progress Notes (Signed)
Patient ID: Natalie [name], female   DOB: 1991-08-11, 23 y.o.   MRN: [id number] 07-20-14 @ 1038 nursing discharge note  Patient denies si/hi/avh. Pt verbalizes understanding of discharge instructions, got her prescriptions, samples and stated she understood her  follow up appts. Pt signed for their belongings from locker. Pt was walked to the lobby and was transported by family/friend. rn called mother at 85 and she stated she would p/u her daughter and transport her home. She stated it would take her about 15 minutes.

## 2014-07-20 NOTE — BHH Suicide Risk Assessment (Signed)
   Demographic Factors:  Low socioeconomic status, Unemployed and married  Total Time spent with patient: 20 minutes  Psychiatric Specialty Exam: Physical Exam  Psychiatric: She has a normal mood and affect. Her speech is normal and behavior is normal. Judgment and thought content normal. Cognition and memory are normal.    Review of Systems  Constitutional: Negative.   HENT: Negative.   Eyes: Negative.   Respiratory: Negative.   Cardiovascular: Negative.   Gastrointestinal: Negative.   Genitourinary: Negative.   Musculoskeletal: Negative.   Skin: Negative.   Neurological: Negative.   Endo/Heme/Allergies: Negative.   Psychiatric/Behavioral: Negative.     Blood pressure 98/58, pulse 78, temperature 98.4 F (36.9 C), temperature source Oral, resp. rate 16, height 5\' 4"  (1.626 m), weight 86.183 kg (190 lb).Body mass index is 32.6 kg/(m^2).  General Appearance: Fairly Groomed  ::  Fair  Speech:  Clear and Coherent  Volume:  Normal  Mood:  dysphoric  Affect:  Appropriate  Thought Process:  Goal Directed  Orientation:  Full (Time, Place, and Person)  Thought Content:  Negative  Suicidal Thoughts:  No  Homicidal Thoughts:  No  Memory:  Immediate;   Fair Recent;   Fair Remote;   Fair  Judgement:  Fair  Insight:  Fair  Psychomotor Activity:  Normal  Concentration:  Fair  Recall:  Good  Fund of Knowledge:Fair  Language: Fair  Akathisia:  No  Handed:  Right  AIMS (if indicated):     Assets:  Communication Skills Desire for Improvement Physical Health  Sleep:  Number of Hours: 6.75    Musculoskeletal: Strength & Muscle Tone: within normal limits Gait & Station: normal Patient leans: N/A   Mental Status Per Nursing Assessment::   On Admission:     Current Mental Status by Physician: patient denies suicidal ideation, intent or plan  Loss Factors: Financial problems/change in socioeconomic status  Historical Factors: Prior suicide attempts and poor  impulse control  Risk Reduction Factors:   Positive social support  Continued Clinical Symptoms:  Resolving delusions and mood symptoms  Cognitive Features That Contribute To Risk:  Closed-mindedness    Suicide Risk:  Minimal: No identifiable suicidal ideation.  Patients presenting with no risk factors but with morbid ruminations; may be classified as minimal risk based on the severity of the depressive symptoms  Discharge Diagnoses:   AXIS I:  Bipolar 1 disorder, depressed  AXIS II:  Cluster B Traits AXIS III:   Past Medical History  Diagnosis Date  . None reported    AXIS IV:  other psychosocial or environmental problems, problems related to social environment and problems with primary support group AXIS V:  61-70 mild symptoms  Plan Of Care/Follow-up recommendations:  Activity:  as tolerated Diet:  routine Tests:  Valproic acid level: 79.2 Other:  patient to keep her after care appointment  Is patient on multiple antipsychotic therapies at discharge:  No   Has Patient had three or more failed trials of antipsychotic monotherapy by history:  No  Recommended Plan for Multiple Antipsychotic Therapies: NA    [phone], MD 07/20/2014, 9:57 AM

## 2014-07-24 NOTE — Progress Notes (Signed)
Patient Discharge Instructions:  After Visit Summary (AVS):   Faxed to:  07/24/14 Discharge Summary Note:   Faxed to:  07/24/14 Psychiatric Admission Assessment Note:   Faxed to:  07/24/14 Suicide Risk Assessment - Discharge Assessment:   Faxed to:  07/24/14 Faxed/Sent to the Next Level Care provider:  07/24/14 Faxed to [employer] @   [phone] Faxed to [facility] @ [phone]  Jerelene Redden, 07/24/2014, 2:54 PM

## 2014-08-26 ENCOUNTER — Encounter (HOSPITAL_COMMUNITY): Payer: Self-pay | Admitting: Emergency Medicine

## 2014-08-26 ENCOUNTER — Emergency Department (HOSPITAL_COMMUNITY)
Admission: EM | Admit: 2014-08-26 | Discharge: 2014-08-27 | Disposition: A | Discharge status: Home / Self Care | Payer: Medicaid Other | Attending: Emergency Medicine | Admitting: Emergency Medicine

## 2014-08-26 DIAGNOSIS — F3131 Bipolar disorder, current episode depressed, mild: Secondary | ICD-10-CM | POA: Diagnosis not present

## 2014-08-26 DIAGNOSIS — F411 Generalized anxiety disorder: Secondary | ICD-10-CM | POA: Diagnosis not present

## 2014-08-26 DIAGNOSIS — Z008 Encounter for other general examination: Secondary | ICD-10-CM | POA: Insufficient documentation

## 2014-08-26 DIAGNOSIS — Z79899 Other long term (current) drug therapy: Secondary | ICD-10-CM | POA: Diagnosis not present

## 2014-08-26 LAB — RAPID URINE DRUG SCREEN, HOSP PERFORMED
Amphetamines: NOT DETECTED
BARBITURATES: NOT DETECTED
Benzodiazepines: NOT DETECTED
COCAINE: NOT DETECTED
Opiates: NOT DETECTED
Tetrahydrocannabinol: NOT DETECTED

## 2014-08-26 NOTE — ED Notes (Addendum)
Patient is voluntary per ED Doctor and patient being placed in blue scrubs.

## 2014-08-26 NOTE — ED Notes (Signed)
Patient refusing blood draw and ED Doctor stated currently waiting for TTS result.

## 2014-08-26 NOTE — ED Notes (Signed)
Husband at bedside and for the past 3 days patient has flat affect and both have been staying and family friends house and the way she looks is scaring the kids.

## 2014-08-26 NOTE — BH Assessment (Signed)
Tele Assessment Note   Natalie [name] is an 23 y.o. female that was assessed by CSW on this date via tele assessment after presenting to [facility] voluntarily.  Pt presents guarded, suspicious, paranoid and selectively mute.  Pt stared at the camera/CSW the entire assessment.  Pt would not respond to questions but when CSW stated that CSW would have someone talk to her tomorrow, when she is able to talk pt would quickly state something.  Pt only stated "yes" to questions, "I need help" and "I'm in danger".  Pt states that she had a flashback and states "I think I was..." but wouldn't complete her sentence.  CSW notes pt was hospitalized at [facility] 4 times over the last year with similar symptoms.   CSW spoke with pt's mother, Amenah Tucci at this time [phone]).  Mother states that she doesn't know why pt is in the hospital this time exactly but was able to give information she is aware of.  Mother states that pt moved out of their home to live with husband at a friend's house a month ago.  Mother states that she knows pt was at [facility] unit for the last week and d/c yesterday.  Mother states that while there they took her off all of her meds and sent her home with two new meds.  Mother states that pt now has Medicaid and gave CSW the number to file it for billing ([phone] [id number]).  Mother states that she knows pt needs help and medication stabilization as she is not herself from the sounds of it, from husband's report.  Mother states that husband states pt was being angry, combative and delusional at home, which was scaring the small children of the friend's.  Husband has been unreachable today.    Pt appears to meet inpatient hospitalization criteria, per current presentation and in relation to similarly presenting this way during past hospitalizations, per Terressa Koyanagi., NP.  TTS to seek placement.    Axis I: Bipolar Disorder I per history Axis II: Cluster B traits reported in past  hospitalizations Axis III:  Past Medical History  Diagnosis Date  . Psychiatric diagnosis   . Chronic bipolar disorder   . OCD (obsessive compulsive disorder)   . H/O suicide attempt 05-2013    OD  . Anxiety   . Depression    Axis IV: housing problems, other psychosocial or environmental problems, problems related to social environment and problems with primary support group Axis V: 11-20 some danger of hurting self or others possible OR occasionally fails to maintain minimal personal hygiene OR gross impairment in communication  Past Medical History:  Past Medical History  Diagnosis Date  . Psychiatric diagnosis   . Chronic bipolar disorder   . OCD (obsessive compulsive disorder)   . H/O suicide attempt 05-2013    OD  . Anxiety   . Depression     History reviewed. No pertinent past surgical history.  Family History: No family history on file.  Social History:  reports that she has never smoked. She does not have any smokeless tobacco history on file. She reports that she does not drink alcohol or use illicit drugs.  Additional Social History:  Alcohol / Drug Use Pain Medications: See MAR Prescriptions: See MAR Over the Counter: See MAR History of alcohol / drug use?: No history of alcohol / drug abuse Longest period of sobriety (when/how long): unknown  CIWA: CIWA-Ar BP: 134/82 mmHg Pulse Rate: 94 COWS:    PATIENT  STRENGTHS: (choose at least two) Supportive family/friends  Allergies: No Known Allergies  Home Medications:  (Not in a hospital admission)  OB/GYN Status:  No LMP recorded. Patient is not currently having periods (Reason: Other).  General Assessment Data Location of Assessment: [facility] ED ACT Assessment: Yes Is this a Tele or Face-to-Face Assessment?: Tele Assessment Is this an Initial Assessment or a Re-assessment for this encounter?: Initial Assessment Living Arrangements: Parent Can pt return to current living arrangement?: Yes Admission Status:  Voluntary Is patient capable of signing voluntary admission?: Yes Transfer from: Home Referral Source: Self/Family/Friend     [facility] Crisis Care Plan Living Arrangements: Parent Name of Psychiatrist:  (Unknown - referred to [facility] on last admission) Name of Therapist:  (Unknown Vesta Mixer)  Education Status Is patient currently in school?: No Current Grade:  (N/A) Highest grade of school patient has completed:  (unknown) Name of school:  (unknown) Contact person:  (N/A)  Risk to self with the past 6 months Suicidal Ideation: No Suicidal Intent: No Is patient at risk for suicide?: No Suicidal Plan?: No Specify Current Suicidal Plan:  (N/A) Access to Means: No Specify Access to Suicidal Means:  (N/A) What has been your use of drugs/alcohol within the last 12 months?:  (unknown) Previous Attempts/Gestures: No How many times?:  (0) Other Self Harm Risks:  (N/A) Triggers for Past Attempts: Unknown;Unpredictable Intentional Self Injurious Behavior: None Family Suicide History: Unknown Recent stressful life event(s):  (unknown) Persecutory voices/beliefs?: No Depression: No Substance abuse history and/or treatment for substance abuse?: No Suicide prevention information given to non-admitted patients: Not applicable  Risk to Others within the past 6 months Homicidal Ideation: No Thoughts of Harm to Others: No Current Homicidal Intent: No Current Homicidal Plan: No Access to Homicidal Means: No Identified Victim:  (N/A) History of harm to others?: No Assessment of Violence: None Noted Violent Behavior Description:  (none noted during hospitalization) Does patient have access to weapons?: No Criminal Charges Pending?: No Does patient have a court date: No  Psychosis Hallucinations: None noted Delusions: None noted  Mental Status Report Appear/Hygiene: Unremarkable Eye Contact: Other (Comment) (staring) Motor Activity: Rigidity Speech: Elective mutism;Soft Level of  Consciousness: Quiet/awake Mood: Preoccupied;Suspicious Affect: Preoccupied;Constricted Anxiety Level: None Thought Processes: Thought Blocking Judgement: Impaired Orientation: Unable to assess Obsessive Compulsive Thoughts/Behaviors: None  Cognitive Functioning Concentration: Unable to Assess Memory: Unable to Assess IQ: Average Insight: Unable to Assess Impulse Control: Unable to Assess Appetite: Fair Weight Loss:  (none noted) Weight Gain:  (none noted) Sleep: Unable to Assess Total Hours of Sleep:  (unknown) Vegetative Symptoms: Unable to Assess  ADLScreening [facility] Assessment Services) Patient's cognitive ability adequate to safely complete daily activities?: Yes Patient able to express need for assistance with ADLs?: Yes Independently performs ADLs?: Yes (appropriate for developmental age)  Prior Inpatient Therapy Prior Inpatient Therapy: Yes Prior Therapy Dates:  ([facility] 4 times in the last year) Prior Therapy Facilty/Provider(s):  ([facility]) Reason for Treatment:  (psychosis)  Prior Outpatient Therapy Prior Outpatient Therapy: Yes Prior Therapy Dates:  ([facility] per chart) Prior Therapy Facilty/Provider(s):  [facility]) Reason for Treatment:  (medication management)  ADL Screening (condition at time of admission) Patient's cognitive ability adequate to safely complete daily activities?: Yes Is the patient deaf or have difficulty hearing?: No Does the patient have difficulty seeing, even when wearing glasses/contacts?: No Does the patient have difficulty concentrating, remembering, or making decisions?: Yes Patient able to express need for assistance with ADLs?: Yes Does the patient have difficulty dressing  or bathing?: No Independently performs ADLs?: Yes (appropriate for developmental age) Does the patient have difficulty walking or climbing stairs?: No Weakness of Legs: None Weakness of Arms/Hands: None  Home Assistive Devices/Equipment Home Assistive  Devices/Equipment: None  Therapy Consults (therapy consults require a physician order) PT Evaluation Needed: No OT Evalulation Needed: No SLP Evaluation Needed: No Abuse/Neglect Assessment (Assessment to be complete while patient is alone) Physical Abuse: Denies Verbal Abuse: Denies Sexual Abuse: Denies Exploitation of patient/patient's resources: Denies Self-Neglect: Denies Values / Beliefs Cultural Requests During Hospitalization: None Spiritual Requests During Hospitalization: None Consults Spiritual Care Consult Needed: No Social Work Consult Needed: No Merchant navy officer (For Healthcare) Does patient have an advance directive?: No Nutrition Screen- [facility] Adult/WL/AP Patient's home diet: Regular  Additional Information 1:1 In Past 12 Months?: No CIRT Risk: No Elopement Risk: No Does patient have medical clearance?: Yes  Child/Adolescent Assessment Running Away Risk: Denies Bed-Wetting: Denies Destruction of Property: Denies Cruelty to Animals: Denies Stealing: Denies Rebellious/Defies Authority: Denies Satanic Involvement: Denies Archivist: Denies Problems at [employer]: Denies Gang Involvement: Denies  Disposition:  Disposition Initial Assessment Completed for this Encounter: Yes Disposition of Patient: Inpatient treatment program Type of inpatient treatment program: Adult  Carmina Miller 08/26/2014 5:57 PM

## 2014-08-26 NOTE — ED Provider Notes (Signed)
CSN: [id number]     Arrival date & time 08/26/14  1238 History   First MD Initiated Contact with Patient 08/26/14 1631     Chief Complaint  Patient presents with  . Psychiatric Evaluation     (Consider location/radiation/quality/duration/timing/severity/associated sxs/prior Treatment) HPIPatient presents with her spouse who provides much of the history of present illness. He notes that over the past 3 days, since recent adjustment of her medication patient has had multiple episodes of decreased interactivity, similar to an absence seizure, though without any seizure like activity. Patient has had no new overt hallucination episodes, and has not had any aggressiveness or outbursts. There've been no clear precipitating or alleviating, exacerbating factors. Patient does not provide any aspect of the history of present illness, mumbles occasionally, answers questions briefly.  Level V caveat secondary to psychiatric disease.     Past Medical History  Diagnosis Date  . Psychiatric diagnosis   . Chronic bipolar disorder   . OCD (obsessive compulsive disorder)   . H/O suicide attempt 05-2013    OD  . Anxiety   . Depression    History reviewed. No pertinent past surgical history. No family history on file. History  Substance Use Topics  . Smoking status: Never Smoker   . Smokeless tobacco: Not on file  . Alcohol Use: No   OB History   Grav Para Term Preterm Abortions TAB SAB Ect Mult Living                 Review of Systems  Unable to perform ROS: Psychiatric disorder      Allergies  Review of patient's allergies indicates no known allergies.  Home Medications   Prior to Admission medications   Medication Sig Start Date End Date Taking? Authorizing Provider  benztropine (COGENTIN) 0.5 MG tablet Take 1 tablet (0.5 mg total) by mouth 2 (two) times daily. 07/20/14   Fransisca Kaufmann, NP  divalproex (DEPAKOTE) 500 MG DR tablet Take 2 tablets (1,000 mg total) by mouth at bedtime.  07/20/14   Fransisca Kaufmann, NP  Docusate Calcium (STOOL SOFTENER PO) Take 1 tablet by mouth daily as needed (constipation).    Historical Provider, MD  FLUoxetine (PROZAC) 10 MG capsule Take 1 capsule (10 mg total) by mouth daily. 07/20/14   Fransisca Kaufmann, NP  haloperidol (HALDOL) 5 MG tablet Take 1 tablet (5 mg total) by mouth 2 (two) times daily. 07/20/14   Fransisca Kaufmann, NP  Multiple Vitamin (MULTIVITAMIN WITH MINERALS) TABS tablet Take 1 tablet by mouth daily. May purchase over the counter. 07/20/14   Fransisca Kaufmann, NP  propranolol (INDERAL) 20 MG tablet Take 1 tablet (20 mg total) by mouth 2 (two) times daily. 07/20/14   Fransisca Kaufmann, NP  traZODone (DESYREL) 100 MG tablet Take 1 tablet (100 mg total) by mouth at bedtime. 07/20/14   Fransisca Kaufmann, NP   BP 134/82  Pulse 94  Temp(Src) 98.2 F (36.8 C) (Oral)  Resp 18  SpO2 100% Physical Exam  Nursing note and vitals reviewed. Constitutional: She appears well-developed and well-nourished. No distress.  HENT:  Head: Normocephalic and atraumatic.  Eyes: Conjunctivae and EOM are normal.  Cardiovascular: Normal rate and regular rhythm.   Pulmonary/Chest: Effort normal and breath sounds normal. No stridor. No respiratory distress.  Abdominal: She exhibits no distension.  Musculoskeletal: She exhibits no edema.  Neurological: She is alert. No cranial nerve deficit.  Patient is awake and alert, walking, with no facial asymmetry, numbness weakness, but does not fully repleted the  neurologic exam  Skin: Skin is warm and dry.  Psychiatric: She has a normal mood and affect.  Patient is minimally interactive, essentially nonverbal, answering some questions briefly, specifically, though not consistently.     ED Course  Procedures (including critical care time) Labs Review Labs Reviewed  URINE RAPID DRUG SCREEN (HOSP PERFORMED)  CBC WITH DIFFERENTIAL  COMPREHENSIVE METABOLIC PANEL  ACETAMINOPHEN LEVEL  SALICYLATE LEVEL  ETHANOL   I reviewed the patient's  chart including recent behavioral health evaluation MDM  Patient history bipolar disorder presents with no change in mental status, and on exam is not interactive, his distance, not acting appropriately, and given her psychiatric disease, discussed her case with behavioral health.  Given the patient's history, she meets criteria for further evaluation and management as an inpatient. Patient remained hemodynamically stable, in no distress throughout the middle portion of her emergency department course, and is awaiting placement on signout  Gerhard Munch, MD 08/27/14 0009

## 2014-08-26 NOTE — ED Notes (Addendum)
Pt presents to department for evaluation of psychiatric evaluation, family member states she is very angry, combative and experiencing delusions at home. Currently taking medications to manage Bipolar Disorder. Pt is unable to answer questions in triage.

## 2014-08-26 NOTE — ED Notes (Signed)
[name] husband states that he wants to stay at pt's bedside since he has no ride home and pot does not want him to leave. [name] husband is telling pt that she does not have to stay since she is voluntary. Dr. Jeraldine Loots informed of pt's intent to leave AMA. [name] husband arguing with EMT about the rules of [facility] and not being able to stay overnight.

## 2014-08-26 NOTE — ED Notes (Signed)
EDP aware that pt is refusing lab draw.

## 2014-08-26 NOTE — ED Notes (Signed)
Patient and husband wanded by security

## 2014-08-26 NOTE — ED Notes (Signed)
Attempted to e assess pt. Uncooperative and mute. Pt standing in doorway but will not speak with this RN at this time.

## 2014-08-26 NOTE — ED Notes (Signed)
Pt refusing to have labs drawn at the time.

## 2014-08-26 NOTE — ED Notes (Signed)
ED Doctor stated patient will be admitted to [facility].

## 2014-08-26 NOTE — ED Notes (Signed)
Pt cooperative with TTS counselor.

## 2014-08-26 NOTE — BH Assessment (Signed)
[facility] Assessment Progress Note  Mom states that pt got approved for Medicaid very recently.  Medicaid # is [id number] Q.

## 2014-08-27 MED ORDER — BENZTROPINE MESYLATE 1 MG PO TABS
0.5000 mg | ORAL_TABLET | Freq: Two times a day (BID) | ORAL | Status: DC
  Filled 2014-08-27 (×2): qty 1

## 2014-08-27 MED ORDER — FLUOXETINE HCL 10 MG PO CAPS
10.0000 mg | ORAL_CAPSULE | Freq: Every day | ORAL | Status: DC
  Filled 2014-08-27: qty 1

## 2014-08-27 MED ORDER — PROPRANOLOL HCL 20 MG PO TABS
20.0000 mg | ORAL_TABLET | Freq: Two times a day (BID) | ORAL | Status: DC
  Filled 2014-08-27 (×3): qty 1

## 2014-08-27 MED ORDER — HALOPERIDOL 5 MG PO TABS
5.0000 mg | ORAL_TABLET | Freq: Two times a day (BID) | ORAL | Status: DC
  Filled 2014-08-27 (×2): qty 1

## 2014-08-27 MED ORDER — DIVALPROEX SODIUM 250 MG PO DR TAB
1000.0000 mg | DELAYED_RELEASE_TABLET | Freq: Every day | ORAL | Status: DC
  Filled 2014-08-27 (×2): qty 4

## 2014-08-27 MED ORDER — TRAZODONE HCL 50 MG PO TABS
100.0000 mg | ORAL_TABLET | Freq: Every day | ORAL | Status: DC
  Filled 2014-08-27 (×2): qty 2

## 2014-08-27 MED ORDER — HALOPERIDOL LACTATE 5 MG/ML IJ SOLN
5.0000 mg | Freq: Once | INTRAMUSCULAR | Status: AC
  Administered 2014-08-27: 5 mg via INTRAMUSCULAR
  Filled 2014-08-27: qty 1

## 2014-08-27 NOTE — ED Notes (Signed)
Pt came to the nurse station asking with whom she needs to talk to sing her self out of the ED, states "I feel that I am been on that room forever". Then pt walk back to her room. Pt is been no cooperative, selective mute most of the time and rounding on hallways just locking at the nurse station. When asking how we can help her, pt just stay looking at you but stay mute at you, no respond to any questions.

## 2014-08-27 NOTE — ED Notes (Addendum)
Patient at the nurses station requesting to take a shower.  Advised that she will have to wait until the am for the shower.  Pt encouraged to go back to her room and try to sleep.

## 2014-08-27 NOTE — Discharge Instructions (Signed)
Take your medications as prescribed by your doctor. Follow up today at [facility] as you plan on doing. For mental health issues and/or crisis, follow up at [facility]. For medical issues, follow up closely with primary care doctor. Return to ER if worse, new symptoms, medical emergency, other concern.     Bipolar Disorder Bipolar disorder is a mental illness. The term bipolar disorder actually is used to describe a group of disorders that all share varying degrees of emotional highs and lows that can interfere with daily functioning, such as work, school, or relationships. Bipolar disorder also can lead to drug abuse, hospitalization, and suicide. The emotional highs of bipolar disorder are periods of elation or irritability and high energy. These highs can range from a mild form (hypomania) to a severe form (mania). People experiencing episodes of hypomania may appear energetic, excitable, and highly productive. People experiencing mania may behave impulsively or erratically. They often make poor decisions. They may have difficulty sleeping. The most severe episodes of mania can involve having very distorted beliefs or perceptions about the world and seeing or hearing things that are not real (psychotic delusions and hallucinations).  The emotional lows of bipolar disorder (depression) also can range from mild to severe. Severe episodes of bipolar depression can involve psychotic delusions and hallucinations. Sometimes people with bipolar disorder experience a state of mixed mood. Symptoms of hypomania or mania and depression are both present during this mixed-mood episode. SIGNS AND SYMPTOMS There are signs and symptoms of the episodes of hypomania and mania as well as the episodes of depression. The signs and symptoms of hypomania and mania are similar but vary in severity. They include:  Inflated self-esteem or feeling of increased self-confidence.  Decreased need for sleep.  Unusual  talkativeness (rapid or pressured speech) or the feeling of a need to keep talking.  Sensation of racing thoughts or constant talking, with quick shifts between topics that may or may not be related (flight of ideas).  Decreased ability to focus or concentrate.  Increased purposeful activity, such as work, studies, or social activity, or nonproductive activity, such as pacing, squirming and fidgeting, or finger and toe tapping.  Impulsive behavior and use of poor judgment, resulting in high-risk activities, such as having unprotected sex or spending excessive amounts of money. Signs and symptoms of depression include the following:   Feelings of sadness, hopelessness, or helplessness.  Frequent or uncontrollable episodes of crying.  Lack of feeling anything or caring about anything.  Difficulty sleeping or sleeping too much.  Inability to enjoy the things you used to enjoy.   Desire to be alone all the time.   Feelings of guilt or worthlessness.  Lack of energy or motivation.   Difficulty concentrating, remembering, or making decisions.  Change in appetite or weight beyond normal fluctuations.  Thoughts of death or the desire to harm yourself. DIAGNOSIS  Bipolar disorder is diagnosed through an assessment by your caregiver. Your caregiver will ask questions about your emotional episodes. There are two main types of bipolar disorder. People with type I bipolar disorder have manic episodes with or without depressive episodes. People with type II bipolar disorder have hypomanic episodes and major depressive episodes, which are more serious than mild depression. The type of bipolar disorder you have can make an important difference in how your illness is monitored and treated. Your caregiver may ask questions about your medical history and use of alcohol or drugs, including prescription medication. Certain medical conditions and substances also can cause  emotional highs and lows  that resemble bipolar disorder (secondary bipolar disorder).  TREATMENT  Bipolar disorder is a long-term illness. It is best controlled with continuous treatment rather than treatment only when symptoms occur. The following treatments can be prescribed for bipolar disorders:  Medication--Medication can be prescribed by a doctor that is an expert in treating mental disorders (psychiatrists). Medications called mood stabilizers are usually prescribed to help control the illness. Other medications are sometimes added if symptoms of mania, depression, or psychotic delusions and hallucinations occur despite the use of a mood stabilizer.  Talk therapy--Some forms of talk therapy are helpful in providing support, education, and guidance. A combination of medication and talk therapy is best for managing the disorder over time. A procedure in which electricity is applied to your brain through your scalp (electroconvulsive therapy) is used in cases of severe mania when medication and talk therapy do not work or work too slowly. Document Released: 02/22/2001 Document Revised: 03/13/2013 Document Reviewed: 12/12/2012 [facility] Patient Information 2015 [place], [place]. This information is not intended to replace advice given to you by your health care provider. Make sure you discuss any questions you have with your health care provider.

## 2014-08-27 NOTE — ED Notes (Signed)
Pt approached nurses station again. Finally stated that she wanted a cup of water. Cup of water given to pt and pt returned to her room.

## 2014-08-27 NOTE — ED Notes (Addendum)
This RN and Grier Rocher, RN, Security and Malachi Bonds EMT at bedside encouraging patient to take her prescribed medications.    House Coverage phoned, charge notified patient needs a Recruitment consultant.    Patient has repeatedly been to the nurses station blankly starring at staff.  This RN as repeatedly asked the patient if there is anything she needs and encouraged her to go to her room to get some rest.  Patient is noncompliant and continues to blankly star.  Patient is taken to her room and

## 2014-08-27 NOTE — ED Provider Notes (Signed)
Nurse indicates pt feels much improved today, and that pt and family member/spouse have arranged plan of taking her to [facility] today, and are requesting discharge from ED.   Pt is awake, alert, oriented. Pt w normal mood and affect. Responds appropriately to questioning. No delusional thoughts, no hallucinations.  Pt denies any thoughts of harm to self.  Family/spouse here, and states they have arranged follow up at [facility] for today.  Patient currently appears medically and psychiatrically stable for d/c.      Suzi Roots, MD 08/27/14 [phone]

## 2014-08-27 NOTE — ED Notes (Signed)
Patient in the shower

## 2014-08-27 NOTE — ED Notes (Addendum)
Pt stood and stared at nurse for approximately 15 minutes when asked if she would take her medications. When RN was about to leave room, pt stated that she would take meds. RN attempted to administer meds to pt, but pt once again stood still and would not take meds. Meds documented as pt refusal. Pt refused to have her vital signs taken, as well.

## 2014-08-27 NOTE — ED Notes (Signed)
Pt continues to walk up to  Nurses station and stare at staff. Will not respond to staff questions. Will finally return to room after asked by staff to do so several times.

## 2014-08-30 ENCOUNTER — Encounter (HOSPITAL_COMMUNITY): Payer: Self-pay | Admitting: Emergency Medicine

## 2014-08-30 ENCOUNTER — Emergency Department (HOSPITAL_COMMUNITY)
Admission: EM | Admit: 2014-08-30 | Discharge: 2014-08-31 | Disposition: A | Discharge status: Other Acute Inpatient Hospital | Payer: Medicaid Other | Attending: Dermatology | Admitting: Dermatology

## 2014-08-30 DIAGNOSIS — F39 Unspecified mood [affective] disorder: Secondary | ICD-10-CM | POA: Diagnosis not present

## 2014-08-30 DIAGNOSIS — Z79899 Other long term (current) drug therapy: Secondary | ICD-10-CM | POA: Insufficient documentation

## 2014-08-30 DIAGNOSIS — Z3202 Encounter for pregnancy test, result negative: Secondary | ICD-10-CM | POA: Diagnosis not present

## 2014-08-30 DIAGNOSIS — F42 Obsessive-compulsive disorder: Secondary | ICD-10-CM | POA: Diagnosis not present

## 2014-08-30 DIAGNOSIS — F419 Anxiety disorder, unspecified: Secondary | ICD-10-CM | POA: Insufficient documentation

## 2014-08-30 DIAGNOSIS — F314 Bipolar disorder, current episode depressed, severe, without psychotic features: Secondary | ICD-10-CM | POA: Diagnosis not present

## 2014-08-30 DIAGNOSIS — Z046 Encounter for general psychiatric examination, requested by authority: Secondary | ICD-10-CM | POA: Diagnosis present

## 2014-08-30 LAB — CBC
HCT: 32.7 % — ABNORMAL LOW (ref 36.0–46.0)
HEMOGLOBIN: 11.3 g/dL — AB (ref 12.0–15.0)
MCH: 31.8 pg (ref 26.0–34.0)
MCHC: 34.6 g/dL (ref 30.0–36.0)
MCV: 92.1 fL (ref 78.0–100.0)
Platelets: 169 10*3/uL (ref 150–400)
RBC: 3.55 MIL/uL — AB (ref 3.87–5.11)
RDW: 13.5 % (ref 11.5–15.5)
WBC: 7.9 10*3/uL (ref 4.0–10.5)

## 2014-08-30 LAB — COMPREHENSIVE METABOLIC PANEL
ALT: 11 U/L (ref 0–35)
AST: 15 U/L (ref 0–37)
Albumin: 4.2 g/dL (ref 3.5–5.2)
Alkaline Phosphatase: 66 U/L (ref 39–117)
Anion gap: 15 (ref 5–15)
BUN: 7 mg/dL (ref 6–23)
CO2: 24 meq/L (ref 19–32)
CREATININE: 0.58 mg/dL (ref 0.50–1.10)
Calcium: 9.5 mg/dL (ref 8.4–10.5)
Chloride: 101 mEq/L (ref 96–112)
GFR calc Af Amer: >90 mL/min (ref >90)
GLUCOSE: 85 mg/dL (ref 70–99)
Potassium: 3.5 mEq/L — ABNORMAL LOW (ref 3.7–5.3)
Sodium: 140 mEq/L (ref 137–147)
Total Bilirubin: 0.5 mg/dL (ref 0.3–1.2)
Total Protein: 7.6 g/dL (ref 6.0–8.3)

## 2014-08-30 LAB — RAPID URINE DRUG SCREEN, HOSP PERFORMED
Amphetamines: NOT DETECTED
BARBITURATES: NOT DETECTED
Benzodiazepines: NOT DETECTED
Cocaine: NOT DETECTED
Opiates: NOT DETECTED
Tetrahydrocannabinol: NOT DETECTED

## 2014-08-30 LAB — ETHANOL

## 2014-08-30 LAB — PREGNANCY, URINE: Preg Test, Ur: NEGATIVE

## 2014-08-30 LAB — ACETAMINOPHEN LEVEL

## 2014-08-30 LAB — SALICYLATE LEVEL: Salicylate Lvl: <2 mg/dL — ABNORMAL LOW (ref 2.8–20.0)

## 2014-08-30 MED ORDER — IBUPROFEN 200 MG PO TABS: 600.0000 mg | ORAL_TABLET | Freq: Three times a day (TID) | ORAL | Status: DC | PRN

## 2014-08-30 MED ORDER — LURASIDONE HCL 40 MG PO TABS
20.0000 mg | ORAL_TABLET | Freq: Every day | ORAL | Status: DC
  Filled 2014-08-30: qty 1

## 2014-08-30 MED ORDER — LURASIDONE HCL 20 MG PO TABS
20.0000 mg | ORAL_TABLET | Freq: Every day | ORAL | Status: DC
  Administered 2014-08-30: 20 mg via ORAL
  Filled 2014-08-30 (×2): qty 1

## 2014-08-30 MED ORDER — ACETAMINOPHEN 325 MG PO TABS: 650.0000 mg | ORAL_TABLET | ORAL | Status: DC | PRN

## 2014-08-30 MED ORDER — LORAZEPAM 1 MG PO TABS
1.0000 mg | ORAL_TABLET | Freq: Three times a day (TID) | ORAL | Status: DC | PRN
  Filled 2014-08-30: qty 1

## 2014-08-30 NOTE — ED Notes (Signed)
Pt brought in by [employer] with IVC papers.  Husband at bedside.  Pt and husband reports that they were at the husband's office at [employer] today, had an argument and left the office and went missing for a while.  When she was found she would not verbally respond to the [employer].  Pt is concerned about herself and her husband.  Husband is upset because he states "[place] or [facility] doesn't do anything for her."  He reports that he recently just had a stroke is not doing good himself and is thinking about going to Behavioral himself.  [facility] EDP at bedside, told husband that the last time pt was seen in the ED for same that he wanted to take pt to [facility] instead, before pt seeing TTS.

## 2014-08-30 NOTE — ED Provider Notes (Signed)
CSN: [id number]     Arrival date & time 08/30/14  2054 History   First MD Initiated Contact with Patient 08/30/14 2104     Chief Complaint  Patient presents with  . Medical Clearance     (Consider location/radiation/quality/duration/timing/severity/associated sxs/prior Treatment) The history is provided by the patient and the spouse.  pt with hx bipolar disorder, c/o depression, feeling constantly sad.  States is taking her normal medication, latuda, but doesn't feel it helps. Denies thoughts of harm to self or others. Spouse also notes very erratic emotions. Pt denies etoh or substance abuse. Has also noted trouble sleeping at night, thoughts racing.  Spouse took out ivc papers. Pt states physical health otherwise at baseline. No recent fever or illness. No trauma/injury.        Past Medical History  Diagnosis Date  . Psychiatric diagnosis   . Chronic bipolar disorder   . OCD (obsessive compulsive disorder)   . H/O suicide attempt 05-2013    OD  . Anxiety   . Depression    History reviewed. No pertinent past surgical history. No family history on file. History  Substance Use Topics  . Smoking status: Never Smoker   . Smokeless tobacco: Not on file  . Alcohol Use: No   OB History   Grav Para Term Preterm Abortions TAB SAB Ect Mult Living                 Review of Systems  Constitutional: Negative for fever and chills.  Eyes: Negative for redness.  Respiratory: Negative for shortness of breath.   Cardiovascular: Negative for chest pain.  Gastrointestinal: Negative for abdominal pain.  Genitourinary: Negative for flank pain.  Musculoskeletal: Negative for back pain and neck pain.  Skin: Negative for rash.  Neurological: Negative for headaches.  Hematological: Does not bruise/bleed easily.  Psychiatric/Behavioral: Positive for dysphoric mood.      Allergies  Review of patient's allergies indicates no known allergies.  Home Medications   Prior to Admission  medications   Medication Sig Start Date End Date Taking? Authorizing Provider  benztropine (COGENTIN) 0.5 MG tablet Take 1 tablet (0.5 mg total) by mouth 2 (two) times daily. 07/20/14   Fransisca Kaufmann, NP  divalproex (DEPAKOTE) 500 MG DR tablet Take 2 tablets (1,000 mg total) by mouth at bedtime. 07/20/14   Fransisca Kaufmann, NP  Docusate Calcium (STOOL SOFTENER PO) Take 1 tablet by mouth daily as needed (constipation).    Historical Provider, MD  FLUoxetine (PROZAC) 10 MG capsule Take 1 capsule (10 mg total) by mouth daily. 07/20/14   Fransisca Kaufmann, NP  haloperidol (HALDOL) 5 MG tablet Take 1 tablet (5 mg total) by mouth 2 (two) times daily. 07/20/14   Fransisca Kaufmann, NP  Multiple Vitamin (MULTIVITAMIN WITH MINERALS) TABS tablet Take 1 tablet by mouth daily. May purchase over the counter. 07/20/14   Fransisca Kaufmann, NP  propranolol (INDERAL) 20 MG tablet Take 1 tablet (20 mg total) by mouth 2 (two) times daily. 07/20/14   Fransisca Kaufmann, NP  traZODone (DESYREL) 100 MG tablet Take 1 tablet (100 mg total) by mouth at bedtime. 07/20/14   Fransisca Kaufmann, NP   BP 128/89  Pulse 63  Temp(Src) 98.2 F (36.8 C) (Oral)  Resp 18  Ht  (1.676 m)  Wt 175 lb 4 oz (79.493 kg)  BMI 28.30 kg/m2  SpO2 100% Physical Exam  Nursing note and vitals reviewed. Constitutional: She is oriented to person, place, and time. She appears well-developed and well-nourished.  No distress.  HENT:  Head: Atraumatic.  Mouth/Throat: Oropharynx is clear and moist.  Eyes: Conjunctivae are normal. Pupils are equal, round, and reactive to light. No scleral icterus.  Neck: Neck supple. No tracheal deviation present.  Cardiovascular: Normal rate, regular rhythm, normal heart sounds and intact distal pulses.   Pulmonary/Chest: Effort normal and breath sounds normal. No respiratory distress.  Abdominal: Soft. Normal appearance. She exhibits no distension. There is no tenderness.  Musculoskeletal: She exhibits no edema.  Neurological: She is alert and  oriented to person, place, and time.  Steady gait.   Skin: Skin is warm and dry. No rash noted. She is not diaphoretic.  Psychiatric:  Depressed mood, flat affect.     ED Course  Procedures (including critical care time) Labs Review  Results for orders placed during the hospital encounter of 08/30/14  CBC      Result Value Ref Range   WBC 7.9  4.0 - 10.5 K/uL   RBC 3.55 (*) 3.87 - 5.11 MIL/uL   Hemoglobin 11.3 (*) 12.0 - 15.0 g/dL   HCT 19.3 (*) 79.0 - 24.0 %   MCV 92.1  78.0 - 100.0 fL   MCH 31.8  26.0 - 34.0 pg   MCHC 34.6  30.0 - 36.0 g/dL   RDW 97.3  53.2 - 99.2 %   Platelets 169  150 - 400 K/uL  COMPREHENSIVE METABOLIC PANEL      Result Value Ref Range   Sodium 140  137 - 147 mEq/L   Potassium 3.5 (*) 3.7 - 5.3 mEq/L   Chloride 101  96 - 112 mEq/L   CO2 24  19 - 32 mEq/L   Glucose, Bld 85  70 - 99 mg/dL   BUN 7  6 - 23 mg/dL   Creatinine, Ser 4.26  0.50 - 1.10 mg/dL   Calcium 9.5  8.4 - 83.4 mg/dL   Total Protein 7.6  6.0 - 8.3 g/dL   Albumin 4.2  3.5 - 5.2 g/dL   AST 15  0 - 37 U/L   ALT 11  0 - 35 U/L   Alkaline Phosphatase 66  39 - 117 U/L   Total Bilirubin 0.5  0.3 - 1.2 mg/dL   GFR calc non Af Amer >90  >90 mL/min   GFR calc Af Amer >90  >90 mL/min   Anion gap 15  5 - 15  ACETAMINOPHEN LEVEL      Result Value Ref Range   Acetaminophen (Tylenol), Serum <15.0  10 - 30 ug/mL  SALICYLATE LEVEL      Result Value Ref Range   Salicylate Lvl <2.0 (*) 2.8 - 20.0 mg/dL  URINE RAPID DRUG SCREEN (HOSP PERFORMED)      Result Value Ref Range   Opiates NONE DETECTED  NONE DETECTED   Cocaine NONE DETECTED  NONE DETECTED   Benzodiazepines NONE DETECTED  NONE DETECTED   Amphetamines NONE DETECTED  NONE DETECTED   Tetrahydrocannabinol NONE DETECTED  NONE DETECTED   Barbiturates NONE DETECTED  NONE DETECTED  ETHANOL      Result Value Ref Range   Alcohol, Ethyl (B) <11  0 - 11 mg/dL  PREGNANCY, URINE      Result Value Ref Range   Preg Test, Ur NEGATIVE  NEGATIVE       MDM  Labs.  Psych team consulted.  Reviewed nursing notes and prior charts for additional history.   Recheck pt calm and alert, awaiting psych team eval.  Disposition per psych  team.     Suzi Roots, MD 08/30/14 [phone]

## 2014-08-30 NOTE — ED Notes (Signed)
Pt refused Ativan

## 2014-08-30 NOTE — ED Notes (Signed)
Pt diagnosed with Bipolar DO, pt IVC ed by Husband, presents for medical clearance.  Denies SI or HI, no AV hallucinations.  Pt reports she does not know why she is here, but reports she does not want her med's changed.  Pt standing in hallway and staring at times.  Pt cooperative but anxious.

## 2014-08-31 ENCOUNTER — Inpatient Hospital Stay (HOSPITAL_COMMUNITY)
Admission: RE | Admit: 2014-08-31 | Discharge: 2014-09-14 | DRG: 885 | Disposition: A | Discharge status: Home / Self Care | Payer: Medicaid Other | Source: Intra-hospital | Attending: Psychiatry | Admitting: Psychiatry

## 2014-08-31 ENCOUNTER — Encounter (HOSPITAL_COMMUNITY): Payer: Self-pay | Admitting: *Deleted

## 2014-08-31 DIAGNOSIS — Z609 Problem related to social environment, unspecified: Secondary | ICD-10-CM | POA: Diagnosis present

## 2014-08-31 DIAGNOSIS — F315 Bipolar disorder, current episode depressed, severe, with psychotic features: Principal | ICD-10-CM | POA: Diagnosis present

## 2014-08-31 DIAGNOSIS — R404 Transient alteration of awareness: Secondary | ICD-10-CM | POA: Diagnosis present

## 2014-08-31 DIAGNOSIS — F3163 Bipolar disorder, current episode mixed, severe, without psychotic features: Secondary | ICD-10-CM

## 2014-08-31 DIAGNOSIS — R4701 Aphasia: Secondary | ICD-10-CM | POA: Diagnosis present

## 2014-08-31 DIAGNOSIS — F42 Obsessive-compulsive disorder: Secondary | ICD-10-CM | POA: Diagnosis present

## 2014-08-31 DIAGNOSIS — G4700 Insomnia, unspecified: Secondary | ICD-10-CM | POA: Diagnosis present

## 2014-08-31 DIAGNOSIS — F3181 Bipolar II disorder: Secondary | ICD-10-CM | POA: Diagnosis present

## 2014-08-31 DIAGNOSIS — Z9114 Patient's other noncompliance with medication regimen: Secondary | ICD-10-CM | POA: Diagnosis present

## 2014-08-31 DIAGNOSIS — F319 Bipolar disorder, unspecified: Secondary | ICD-10-CM | POA: Diagnosis present

## 2014-08-31 MED ORDER — ACETAMINOPHEN 325 MG PO TABS: 650.0000 mg | ORAL_TABLET | Freq: Four times a day (QID) | ORAL | Status: DC | PRN

## 2014-08-31 MED ORDER — LORAZEPAM 1 MG PO TABS
1.0000 mg | ORAL_TABLET | Freq: Three times a day (TID) | ORAL | Status: DC | PRN
  Administered 2014-09-01: 1 mg via ORAL
  Filled 2014-08-31 (×5): qty 1

## 2014-08-31 MED ORDER — IBUPROFEN 600 MG PO TABS: 600.0000 mg | ORAL_TABLET | Freq: Three times a day (TID) | ORAL | Status: DC | PRN

## 2014-08-31 MED ORDER — ALUM & MAG HYDROXIDE-SIMETH 200-200-20 MG/5ML PO SUSP: 30.0000 mL | ORAL | Status: DC | PRN

## 2014-08-31 MED ORDER — LURASIDONE HCL 20 MG PO TABS
20.0000 mg | ORAL_TABLET | Freq: Every day | ORAL | Status: DC
  Filled 2014-08-31 (×4): qty 1

## 2014-08-31 MED ORDER — MAGNESIUM HYDROXIDE 400 MG/5ML PO SUSP
30.0000 mL | Freq: Every day | ORAL | Status: DC | PRN
  Administered 2014-09-12: 30 mL via ORAL

## 2014-08-31 MED ORDER — MAGNESIUM HYDROXIDE 400 MG/5ML PO SUSP
30.0000 mL | Freq: Every day | ORAL | Status: DC | PRN
  Administered 2014-09-02: 30 mL via ORAL

## 2014-08-31 NOTE — Tx Team (Signed)
Initial Interdisciplinary Treatment Plan   PATIENT STRESSORS: Medication change or noncompliance   PROBLEM LIST: Problem List/Patient Goals Date to be addressed Date deferred Reason deferred Estimated date of resolution  paranoia 08/31/14     Thought blocking 08/31/14                                                DISCHARGE CRITERIA:  Ability to meet basic life and health needs Improved stabilization in mood, thinking, and/or behavior Verbal commitment to aftercare and medication compliance  PRELIMINARY DISCHARGE PLAN: Attend aftercare/continuing care group Return to previous living arrangement  PATIENT/FAMIILY INVOLVEMENT: This treatment plan has been presented to and reviewed with the patient, Natalie [name], and/or family member, .  The patient and family have been given the opportunity to ask questions and make suggestions.  Natalie [name], [place] 08/31/2014, 7:33 PM

## 2014-08-31 NOTE — Progress Notes (Signed)
23 year old female pt admitted on involuntary basis. On admission, pt presents as having some thought blocking and as well as paranoid. Pt did state that she has been off her medications and spoke about her medications being changed when she went to [facility] and did not take them after she went there. Pt also spoke about how she thinks she may be separated from her husband because she is in here. Pt denied any SI and able to contract for safety on the unit. Pt also unable to tell staff what medications were changed recently. Pt was oriented to the unit and safety maintained.

## 2014-08-31 NOTE — BH Assessment (Signed)
Tele Assessment Note   Natalie [name] is a 23 y.o. female who presents to [facility] via IVC petition, initiated by her spouse.  This Clinical research associate attempted to interview pt, she is a poor historian and is preoccupied.  Pt denies SI/HI/AVH.  This Clinical research associate contacted spouse for collateral information but he was not available.  The following information is collateral, gathered from other sources: pt was accompanied by spouse and they both reported that pt came to spouse's employer([employer]) and they had and argument.  Pt then left was found by police wandering after being gone for several hours.  Pt would not verbally respond to police and she brought to the emerg dept for psych eval.  Pt did tell this writer that she is overwhelmed with "everyday stressors" but would not elaborate any further.  Pt.'s husband notes that her behavior has been more erratic lately, she is not sleeping at night and has racing thoughts.      Axis I: Bipolar I disorder, Current or most recent episode depressed, With psychotic features Axis II: Deferred Axis III:  Past Medical History  Diagnosis Date  . Psychiatric diagnosis   . Chronic bipolar disorder   . OCD (obsessive compulsive disorder)   . H/O suicide attempt 05-2013    OD  . Anxiety   . Depression    Axis IV: other psychosocial or environmental problems, problems related to social environment and problems with primary support group Axis V: 21-30 behavior considerably influenced by delusions or hallucinations OR serious impairment in judgment, communication OR inability to function in almost all areas  Past Medical History:  Past Medical History  Diagnosis Date  . Psychiatric diagnosis   . Chronic bipolar disorder   . OCD (obsessive compulsive disorder)   . H/O suicide attempt 05-2013    OD  . Anxiety   . Depression     History reviewed. No pertinent past surgical history.  Family History: No family history on file.  Social History:  reports that she has  never smoked. She does not have any smokeless tobacco history on file. She reports that she does not drink alcohol or use illicit drugs.  Additional Social History:  Alcohol / Drug Use Pain Medications: See  MAR  Prescriptions: See MAR  Over the Counter: See MAR  History of alcohol / drug use?: No history of alcohol / drug abuse Longest period of sobriety (when/how long): None   CIWA: CIWA-Ar BP: 128/89 mmHg Pulse Rate: 63 COWS:    PATIENT STRENGTHS: (choose at least two) pt impaired, no skills present at this time.   Allergies: No Known Allergies  Home Medications:  (Not in a hospital admission)  OB/GYN Status:  No LMP recorded. Patient is not currently having periods (Reason: Other).  General Assessment Data Location of Assessment: [facility] ED Is this a Tele or Face-to-Face Assessment?: Face-to-Face Is this an Initial Assessment or a Re-assessment for this encounter?: Initial Assessment Living Arrangements: Spouse/significant other (Lives with spouse ) Can pt return to current living arrangement?: Yes Admission Status: Involuntary Is patient capable of signing voluntary admission?: No Transfer from: Acute Hospital Referral Source: MD  Medical Screening Exam [facility] Walk-[facility] ONLY) Medical Exam completed: No Reason for MSE not completed: Other: (None )  [facility] Crisis Care Plan Living Arrangements: Spouse/significant other (Lives with spouse ) Name of Psychiatrist: None  Name of Therapist: Annia Friendly- Vesta Mixer  Education Status Is patient currently in school?: No Current Grade: None  Highest grade of school patient has completed: None  Name of school: None  Contact person: None   Risk to self with the past 6 months Suicidal Ideation: No Suicidal Intent: No Is patient at risk for suicide?: No Suicidal Plan?: No Specify Current Suicidal Plan: None  Access to Means: No Specify Access to Suicidal Means: None  What has been your use of drugs/alcohol within the last 12 months?: Pt  denies  Previous Attempts/Gestures: Yes How many times?: 1 Other Self Harm Risks: None  Triggers for Past Attempts: Unknown;Unpredictable Intentional Self Injurious Behavior: None Family Suicide History: No Recent stressful life event(s): Conflict (Comment) (Marital issues ) Persecutory voices/beliefs?: No Depression: Yes Depression Symptoms: Despondent;Loss of interest in usual pleasures;Feeling worthless/self pity Substance abuse history and/or treatment for substance abuse?: No Suicide prevention information given to non-admitted patients: Not applicable  Risk to Others within the past 6 months Homicidal Ideation: No Thoughts of Harm to Others: No Current Homicidal Intent: No Current Homicidal Plan: No Access to Homicidal Means: No Identified Victim: None  History of harm to others?: No Assessment of Violence: None Noted Violent Behavior Description: None  Does patient have access to weapons?: No Criminal Charges Pending?: No Does patient have a court date: No  Psychosis Hallucinations: None noted Delusions: None noted  Mental Status Report Appear/Hygiene: Unremarkable Eye Contact: Poor Motor Activity: Unremarkable Speech: Elective mutism;Soft Level of Consciousness: Quiet/awake Mood: Preoccupied Affect: Preoccupied Anxiety Level: None Thought Processes: Coherent;Relevant Judgement: Impaired Orientation: Person;Place;Time;Situation Obsessive Compulsive Thoughts/Behaviors: None  Cognitive Functioning Concentration: Normal Memory: Recent Intact;Remote Intact IQ: Average Insight: Poor Impulse Control: Poor Appetite: Fair Weight Loss: 0 Weight Gain: 0 Sleep: Decreased Total Hours of Sleep: 4 Vegetative Symptoms: Decreased grooming  ADLScreening [facility] Assessment Services) Patient's cognitive ability adequate to safely complete daily activities?: Yes Patient able to express need for assistance with ADLs?: Yes Independently performs ADLs?: Yes (appropriate for  developmental age)  Prior Inpatient Therapy Prior Inpatient Therapy: Yes Prior Therapy Dates: 2015 Prior Therapy Facilty/Provider(s): [facility]  Reason for Treatment: Bipolar D/O   Prior Outpatient Therapy Prior Outpatient Therapy: Yes Prior Therapy Dates: Current  Prior Therapy Facilty/Provider(s): [facility] Reason for Treatment: Med Mgt   ADL Screening (condition at time of admission) Patient's cognitive ability adequate to safely complete daily activities?: Yes Is the patient deaf or have difficulty hearing?: No Does the patient have difficulty seeing, even when wearing glasses/contacts?: No Does the patient have difficulty concentrating, remembering, or making decisions?: Yes Patient able to express need for assistance with ADLs?: Yes Does the patient have difficulty dressing or bathing?: No Independently performs ADLs?: Yes (appropriate for developmental age) Does the patient have difficulty walking or climbing stairs?: No Weakness of Legs: None Weakness of Arms/Hands: None  Home Assistive Devices/Equipment Home Assistive Devices/Equipment: None  Therapy Consults (therapy consults require a physician order) PT Evaluation Needed: No OT Evalulation Needed: No SLP Evaluation Needed: No Abuse/Neglect Assessment (Assessment to be complete while patient is alone) Physical Abuse: Denies Verbal Abuse: Denies Sexual Abuse: Denies Exploitation of patient/patient's resources: Denies Self-Neglect: Denies Values / Beliefs Cultural Requests During Hospitalization: None Spiritual Requests During Hospitalization: None Consults Spiritual Care Consult Needed: No Social Work Consult Needed: No Merchant navy officer (For Healthcare) Does patient have an advance directive?: No Would patient like information on creating an advanced directive?: No - patient declined information Nutrition Screen- [facility] Patient's home diet: Regular  Additional Information 1:1 In Past 12 Months?:  No CIRT Risk: No Elopement Risk: No Does patient have medical clearance?: Yes     Disposition:  Disposition  Initial Assessment Completed for this Encounter: Yes Disposition of Patient: Inpatient treatment program;Referred to (Accepted by Janann August, NP; 403-1) Type of inpatient treatment program: Adult Other disposition(s): Other (Comment) (Accepted by Janann August, NP; [phone]) Patient referred to: Other (Comment) (Accepted by Janann August, NP; 403-1)  Murrell Redden 08/31/2014 7:10 AM

## 2014-08-31 NOTE — ED Notes (Signed)
Refused vital signs

## 2014-08-31 NOTE — ED Notes (Signed)
Pt continues to stand in room and periodically stand in hallway.  Pt redirected back to room.

## 2014-08-31 NOTE — Progress Notes (Signed)
Psychoeducational Group Note  Date:  08/31/2014 Time:  2141  Group Topic/Focus:  Wrap-Up Group:   The focus of this group is to help patients review their daily goal of treatment and discuss progress on daily workbooks.  Participation Level: Did Not Attend  Participation Quality:  Not Applicable  Affect:  Not Applicable  Cognitive:  Not Applicable  Insight:  Not Applicable  Engagement in Group: Not Applicable  Additional Comments:  The patient did not attend group despite being encouraged by this author.   Hazle Coca S 08/31/2014, 9:41 PM

## 2014-09-01 DIAGNOSIS — Z9114 Patient's other noncompliance with medication regimen: Secondary | ICD-10-CM

## 2014-09-01 DIAGNOSIS — F3130 Bipolar disorder, current episode depressed, mild or moderate severity, unspecified: Secondary | ICD-10-CM

## 2014-09-01 MED ORDER — ZIPRASIDONE MESYLATE 20 MG IM SOLR
INTRAMUSCULAR | Status: AC
  Administered 2014-09-01: 20 mg via INTRAMUSCULAR
  Filled 2014-09-01: qty 20

## 2014-09-01 MED ORDER — LORAZEPAM 2 MG/ML IJ SOLN
INTRAMUSCULAR | Status: AC
  Administered 2014-09-01: 2 mg via INTRAMUSCULAR
  Filled 2014-09-01: qty 1

## 2014-09-01 MED ORDER — ZIPRASIDONE MESYLATE 20 MG IM SOLR
20.0000 mg | Freq: Once | INTRAMUSCULAR | Status: AC
  Administered 2014-09-01: 20 mg via INTRAMUSCULAR
  Filled 2014-09-01: qty 20

## 2014-09-01 MED ORDER — CARBAMAZEPINE ER 200 MG PO TB12
200.0000 mg | ORAL_TABLET | Freq: Two times a day (BID) | ORAL | Status: DC
  Administered 2014-09-01 – 2014-09-03 (×4): 200 mg via ORAL
  Filled 2014-09-01 (×7): qty 1

## 2014-09-01 MED ORDER — LORAZEPAM 2 MG/ML IJ SOLN
2.0000 mg | Freq: Once | INTRAMUSCULAR | Status: AC
  Administered 2014-09-01: 2 mg via INTRAMUSCULAR

## 2014-09-01 MED ORDER — DIPHENHYDRAMINE HCL 50 MG/ML IJ SOLN
50.0000 mg | Freq: Once | INTRAMUSCULAR | Status: AC
  Administered 2014-09-01: 50 mg via INTRAMUSCULAR
  Filled 2014-09-01: qty 1

## 2014-09-01 MED ORDER — DIPHENHYDRAMINE HCL 50 MG/ML IJ SOLN
INTRAMUSCULAR | Status: AC
  Administered 2014-09-01: 50 mg via INTRAMUSCULAR
  Filled 2014-09-01: qty 1

## 2014-09-01 MED ORDER — LURASIDONE HCL 40 MG PO TABS
40.0000 mg | ORAL_TABLET | Freq: Every day | ORAL | Status: DC
  Administered 2014-09-02 – 2014-09-03 (×2): 40 mg via ORAL
  Filled 2014-09-01 (×3): qty 1

## 2014-09-01 NOTE — Progress Notes (Signed)
Patient ID: Natalie [name], female   DOB: 05-02-1991, 23 y.o.   MRN: [id number] Psychoeducational Group Note  Date:  09/01/2014 Time:  1000  Group Topic/Focus:  inventory group   Participation Level: Did Not Attend  Participation Quality:  Not Applicable  Affect:  Not Applicable  Cognitive:  Not Applicable  Insight:  Not Applicable  Engagement in Group: Not Applicable  Additional Comments:  Pt was given anti-psychotic drugs and is sleeping   Valente David 09/01/2014, 11:46 AM

## 2014-09-01 NOTE — [facility] Group Notes (Signed)
[facility] Group Notes: (Clinical Social Work)   09/01/2014      Type of Therapy:  Group Therapy   Participation Level:  Did Not Attend - declined and stayed in her room   Ambrose Mantle, LCSW 09/01/2014, 1:08 PM

## 2014-09-01 NOTE — H&P (Signed)
Psychiatric Admission Assessment Adult  Patient Identification:  Natalie [name] Date of Evaluation:  09/01/2014 Chief Complaint:  Bipolar Disorder History of Present Illness:: This is one of this patients visits to the ER or [facility].  Patient was was admitted from [facility] under IVC brought in by her spouse.  Patient has a long hx of Bipolar disorder and is non compliant with her medications.Patient was found wandering in the street by the Police and was brought to [facility].  Patient is a poor historian and today was not able to participate in the admission assessment.  Patient managed to say that she came to the hospital because her parents ganged up against her.  She went on to explain that her parents are not interested in helping her with her needs.  She also stated that she came to the hospital to see if the doctors can help her with her medicaid and getting her medications.  Patient was drowsy and sleeping off and on having been medicated earlier. Patient stated that her family members are all"wacky".  She had a disorganized speech  and her thought process was more tangential and circumstantial.  She denies SI/HI.  Our goal is to restart all of her home medications.  Her length of stay will be 2-7 days  and we will be reevaluating her progress and will be getting more information as much as we can.  Elements:  Location:  Bipolar 1 disorder, depressed with Psychotic features. Quality:  disorganised, tangential, circunstancial, . Severity:  severe. Context:  Medication non compliant, marital discord, . Associated Signs/Synptoms: Depression Symptoms:  depressed mood, insomnia, fatigue, difficulty concentrating, (Hypo) Manic Symptoms:   Anxiety Symptoms:   Psychotic Symptoms:  Delusions, PTSD Symptoms:  Total Time spent with patient: 1 hour  Psychiatric Specialty Exam: Physical Exam  ROS  Blood pressure 109/64, pulse 67, temperature 97.6 F (36.4 C), temperature source Oral, resp. rate 16,  height 5' 5"  (1.651 m), weight 68.04 kg (150 lb).Body mass index is 24.96 kg/(m^2).  General Appearance: Casual and Disheveled  Eye Contact::  None  Speech:  Garbled and Slow  Volume:  Decreased  Mood:  Depressed, Dysphoric and Hopeless  Affect:  Congruent, Depressed and Flat  Thought Process:  Circumstantial, Disorganized and Tangential  Orientation:  Other:  unable to answer question  Thought Content:  Delusions  Suicidal Thoughts:  No  Homicidal Thoughts:  No  Memory:  Immediate;   Poor Recent;   Poor Remote;   Poor  Judgement:  Impaired  Insight:  Shallow  Psychomotor Activity:  Decreased  Concentration:  Poor  Recall:  NA  Fund of Knowledge:Poor  Language: Fair  Akathisia:  NA  Handed:  Right  AIMS (if indicated):     Assets:  Desire for Improvement  Sleep:  Number of Hours: 0.25    Musculoskeletal: Strength & Muscle Tone: within normal limits Gait & Station: normal Patient leans: N/A  Past Psychiatric History: Diagnosis:  Hospitalizations:  Outpatient Care:  Substance Abuse Care:  Self-Mutilation:  Suicidal Attempts:  Violent Behaviors:   Past Medical History:   Past Medical History  Diagnosis Date  . Psychiatric diagnosis   . Chronic bipolar disorder   . OCD (obsessive compulsive disorder)   . H/O suicide attempt 05-2013    OD  . Anxiety   . Depression    None. Allergies:  No Known Allergies PTA Medications: Prescriptions prior to admission  Medication Sig Dispense Refill  . Lurasidone HCl (LATUDA) 20 MG TABS Take 20 mg  by mouth at bedtime.        Previous Psychotropic Medications:  Medication/Dose                 Substance Abuse History in the last 12 months:  No.  Consequences of Substance Abuse: NA  Social History:  reports that she has never smoked. She does not have any smokeless tobacco history on file. She reports that she does not drink alcohol or use illicit drugs. Additional Social History:  Current Place of Residence:    Place of Birth:   Family Members: Marital Status:  Married Children:  Sons:  Daughters: Relationships: Education:  unknown Educational Problems/Performance: Religious Beliefs/Practices: History of Abuse (Emotional/Phsycial/Sexual) Ship broker History:  None. Legal History: Hobbies/Interests:  Family History:  History reviewed. No pertinent family history.  Results for orders placed during the hospital encounter of 08/30/14 (from the past 72 hour(s))  CBC     Status: Abnormal   Collection Time    08/30/14 10:01 PM      Result Value Ref Range   WBC 7.9  4.0 - 10.5 K/uL   RBC 3.55 (*) 3.87 - 5.11 MIL/uL   Hemoglobin 11.3 (*) 12.0 - 15.0 g/dL   HCT 32.7 (*) 36.0 - 46.0 %   MCV 92.1  78.0 - 100.0 fL   MCH 31.8  26.0 - 34.0 pg   MCHC 34.6  30.0 - 36.0 g/dL   RDW 13.5  11.5 - 15.5 %   Platelets 169  150 - 400 K/uL  COMPREHENSIVE METABOLIC PANEL     Status: Abnormal   Collection Time    08/30/14 10:01 PM      Result Value Ref Range   Sodium 140  137 - 147 mEq/L   Potassium 3.5 (*) 3.7 - 5.3 mEq/L   Chloride 101  96 - 112 mEq/L   CO2 24  19 - 32 mEq/L   Glucose, Bld 85  70 - 99 mg/dL   BUN 7  6 - 23 mg/dL   Creatinine, Ser 0.58  0.50 - 1.10 mg/dL   Calcium 9.5  8.4 - 10.5 mg/dL   Total Protein 7.6  6.0 - 8.3 g/dL   Albumin 4.2  3.5 - 5.2 g/dL   AST 15  0 - 37 U/L   ALT 11  0 - 35 U/L   Alkaline Phosphatase 66  39 - 117 U/L   Total Bilirubin 0.5  0.3 - 1.2 mg/dL   GFR calc non Af Amer >90  >90 mL/min   GFR calc Af Amer >90  >90 mL/min   Comment: (NOTE)     The eGFR has been calculated using the CKD EPI equation.     This calculation has not been validated in all clinical situations.     eGFR's persistently <90 mL/min signify possible Chronic Kidney     Disease.   Anion gap 15  5 - 15  ACETAMINOPHEN LEVEL     Status: None   Collection Time    08/30/14 10:01 PM      Result Value Ref Range   Acetaminophen (Tylenol), Serum <15.0  10 - 30 ug/mL    Comment:            THERAPEUTIC CONCENTRATIONS VARY     SIGNIFICANTLY. A RANGE OF 10-30     ug/mL MAY BE AN EFFECTIVE     CONCENTRATION FOR MANY PATIENTS.     HOWEVER, SOME ARE BEST TREATED     AT CONCENTRATIONS OUTSIDE  THIS     RANGE.     ACETAMINOPHEN CONCENTRATIONS     >150 ug/mL AT 4 HOURS AFTER     INGESTION AND >50 ug/mL AT 12     HOURS AFTER INGESTION ARE     OFTEN ASSOCIATED WITH TOXIC     REACTIONS.  SALICYLATE LEVEL     Status: Abnormal   Collection Time    08/30/14 10:01 PM      Result Value Ref Range   Salicylate Lvl <9.4 (*) 2.8 - 20.0 mg/dL  ETHANOL     Status: None   Collection Time    08/30/14 10:01 PM      Result Value Ref Range   Alcohol, Ethyl (B) <11  0 - 11 mg/dL   Comment:            LOWEST DETECTABLE LIMIT FOR     SERUM ALCOHOL IS 11 mg/dL     FOR MEDICAL PURPOSES ONLY  URINE RAPID DRUG SCREEN (HOSP PERFORMED)     Status: None   Collection Time    08/30/14 10:13 PM      Result Value Ref Range   Opiates NONE DETECTED  NONE DETECTED   Cocaine NONE DETECTED  NONE DETECTED   Benzodiazepines NONE DETECTED  NONE DETECTED   Amphetamines NONE DETECTED  NONE DETECTED   Tetrahydrocannabinol NONE DETECTED  NONE DETECTED   Barbiturates NONE DETECTED  NONE DETECTED   Comment:            DRUG SCREEN FOR MEDICAL PURPOSES     ONLY.  IF CONFIRMATION IS NEEDED     FOR ANY PURPOSE, NOTIFY LAB     WITHIN 5 DAYS.                LOWEST DETECTABLE LIMITS     FOR URINE DRUG SCREEN     Drug Class       Cutoff (ng/mL)     Amphetamine      1000     Barbiturate      200     Benzodiazepine   765     Tricyclics       465     Opiates          300     Cocaine          300     THC              50  PREGNANCY, URINE     Status: None   Collection Time    08/30/14 10:13 PM      Result Value Ref Range   Preg Test, Ur NEGATIVE  NEGATIVE   Comment:            THE SENSITIVITY OF THIS     METHODOLOGY IS >20 mIU/mL.   Psychological Evaluations:  Assessment:    DSM5:  Schizophrenia Disorders:   Obsessive-Compulsive Disorders:  Hx of OCD Trauma-Stressor Disorders:   Substance/Addictive Disorders:   Depressive Disorders:  Bipolar 1 disorder, depressed, with Psychotic symptoms.  AXIS I:  Bipolar, Depressed AXIS II:  Deferred AXIS III:   Past Medical History  Diagnosis Date  . Psychiatric diagnosis   . Chronic bipolar disorder   . OCD (obsessive compulsive disorder)   . H/O suicide attempt 05-2013    OD  . Anxiety   . Depression    AXIS IV:  other psychosocial or environmental problems and problems related to social environment AXIS V:  21-30 behavior considerably influenced  by delusions or hallucinations OR serious impairment in judgment, communication OR inability to function in almost all areas  Treatment Plan/Recommendations:   Admit for crisis management/stabilization. Review and reinstate any pertinent home medications for other health issues.   Medication management to treat current mood problems Continue taking Tegretol XR 200 MG PO bid for mood stabilization Latuda 40 mg po with breakfast for mood start tomorrow. Ativan  1 mg po every 8 hours as needed for agitation Group counseling sessions and activities. Primary care consults as needed. Continue current treatment plan .  Treatment Plan Summary: Daily contact with patient to assess and evaluate symptoms and progress in treatment Medication management Current Medications:  Current Facility-Administered Medications  Medication Dose Route Frequency Provider Last Rate Last Dose  . acetaminophen (TYLENOL) tablet 650 mg  650 mg Oral Q6H PRN Waylan Boga, NP      . alum & mag hydroxide-simeth (MAALOX/MYLANTA) 200-200-20 MG/5ML suspension 30 mL  30 mL Oral Q4H PRN Waylan Boga, NP      . alum & mag hydroxide-simeth (MAALOX/MYLANTA) 200-200-20 MG/5ML suspension 30 mL  30 mL Oral Q4H PRN Evanna Glenda Chroman, NP      . carbamazepine (TEGRETOL XR) 12 hr tablet 200 mg  200 mg Oral BID  Kacin Dancy      . ibuprofen (ADVIL,MOTRIN) tablet 600 mg  600 mg Oral Q8H PRN Waylan Boga, NP      . LORazepam (ATIVAN) tablet 1 mg  1 mg Oral Q8H PRN Waylan Boga, NP      . Derrill Memo ON 09/02/2014] lurasidone (LATUDA) tablet 40 mg  40 mg Oral Q breakfast Alajah Witman      . magnesium hydroxide (MILK OF MAGNESIA) suspension 30 mL  30 mL Oral Daily PRN Waylan Boga, NP      . magnesium hydroxide (MILK OF MAGNESIA) suspension 30 mL  30 mL Oral Daily PRN Evanna Glenda Chroman, NP        Observation Level/Precautions:  15 minute checks  Laboratory:  CBC Chemistry Profile UDS preg test  Psychotherapy:   Encourage to attend all group therapy.  Medications:   See above  Consultations:  As needed  Discharge Concerns:  Medication non compliant  Estimated LOS: 2-7 days  Other:     I certify that inpatient services furnished can reasonably be expected to improve the patient's condition.   Charmaine Downs, C PMHNP-BC 10/3/20153:26 PM   Patient seen, evaluated and I agree with notes by Nurse Practitioner. Corena Pilgrim, MD

## 2014-09-01 NOTE — Progress Notes (Signed)
[facility] Group Notes:  (Nursing/MHT/Case Management/Adjunct)  Date:  09/01/2014  Time:  9:27 PM  Type of Therapy:  Psychoeducational Skills  Participation Level:  Minimal  Participation Quality:  Appropriate  Affect:  Blunted  Cognitive:  Appropriate  Insight:  Improving  Engagement in Group:  Engaged  Modes of Intervention:  Education  Summary of Progress/Problems: The patient verbalized in group that she had a better day as compared to yesterday. To begin with, she mentioned that she took her medication today even though she didn't feel like she needed it initially. In addition, the patient mentioned that she felt like she had a "better attitude" today. As a theme for the day, her support system will be made up of her family.   Hazle Coca S 09/01/2014, 9:27 PM

## 2014-09-01 NOTE — Progress Notes (Signed)
Patient ID: Natalie [name], female   DOB: Jul 06, 1991, 23 y.o.   MRN: [id number] Psychoeducational Group Note  Date:  09/01/2014 Time:  1030  Group Topic/Focus:  healthy coping skills.   Participation Level: Did Not Attend  Participation Quality:  Not Applicable  Affect:  Not Applicable  Cognitive:  Not Applicable  Insight:  Not Applicable  Engagement in Group: Not Applicable  Additional Comments:  Pt was given anti-psychotic medications and is sleeping  Valente David 09/01/2014, 11:46 AM

## 2014-09-01 NOTE — [facility] Suicide Risk Assessment (Signed)
Suicide Risk Assessment  Admission Assessment     Nursing information obtained from:    Demographic factors:    Current Mental Status:    Loss Factors:    Historical Factors:    Risk Reduction Factors:    Total Time spent with patient: 20 minutes  CLINICAL FACTORS:   Severe Anxiety and/or Agitation Bipolar Disorder:   Mixed State Depression:   Aggression Anhedonia Hopelessness Impulsivity Insomnia Unstable or Poor Therapeutic Relationship Previous Psychiatric Diagnoses and Treatments  Psychiatric Specialty Exam:     Blood pressure 109/64, pulse 67, temperature 97.6 F (36.4 C), temperature source Oral, resp. rate 16, height 5\' 5"  (1.651 m), weight 68.04 kg (150 lb).Body mass index is 24.96 kg/(m^2).  General Appearance: Casual  Eye Contact::  Minimal  Speech:  Slow and selective mutism  Volume:  Decreased  Mood:  Depressed and Dysphoric  Affect:  Constricted  Thought Process:  Disorganized  Orientation:  Full (Time, Place, and Person)  Thought Content:  Paranoid Ideation  Suicidal Thoughts:  No  Homicidal Thoughts:  No  Memory:  Immediate;   Fair Recent;   Fair Remote;   Fair  Judgement:  Impaired  Insight:  Lacking  Psychomotor Activity:  Decreased  Concentration:  Fair  Recall:  [id number] of Knowledge:Fair  Language: Fair  Akathisia:  No  Handed:  Right  AIMS (if indicated):     Assets:  Physical Health  Sleep:  Number of Hours: 0.25   Musculoskeletal: Strength & Muscle Tone: within normal limits Gait & Station: normal Patient leans: N/A  COGNITIVE FEATURES THAT CONTRIBUTE TO RISK:  Closed-mindedness Polarized thinking    SUICIDE RISK:   Minimal: No identifiable suicidal ideation.  Patients presenting with no risk factors but with morbid ruminations; may be classified as minimal risk based on the severity of the depressive symptoms  PLAN OF CARE:1. Admit for crisis management and stabilization. 2. Medication management to reduce current symptoms  to base line and improve the     patient's overall level of functioning 3. Treat health problems as indicated. 4. Develop treatment plan to decrease risk of relapse upon discharge and the need for     readmission. 5. Psycho-social education regarding relapse prevention and self care. 6. Health care follow up as needed for medical problems. 7. Restart home medications where appropriate.  I certify that inpatient services furnished can reasonably be expected to improve the patient's condition.  Ashwika Freels,MD 09/01/2014, 12:46 PM
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
## 2014-09-01 NOTE — Progress Notes (Signed)
Patient ID: Natalie [name], female   DOB: 1991/03/09, 23 y.o.   MRN: [id number] Nursing shift note: D: pt continues to be selectively mute, preoccupied,  wanders and is an elopment risk.  She appears to be responding to internal stimuli. She refused any prn medications this am. A: she is a new admission and had just prn  Admission orders. RN solicited MD and obtained orders for geodon 20 mg, benadryl 50 mg and ativan 2 mg all by injection at 0930 am to address he psychosis. R: she denies any si/hi presently. RN will monitor and Q 15 min ck's continue.

## 2014-09-02 MED ORDER — LORAZEPAM 2 MG/ML IJ SOLN
2.0000 mg | Freq: Once | INTRAMUSCULAR | Status: AC
  Administered 2014-09-02: 2 mg via INTRAMUSCULAR

## 2014-09-02 MED ORDER — DIPHENHYDRAMINE HCL 50 MG/ML IJ SOLN
INTRAMUSCULAR | Status: AC
  Filled 2014-09-02: qty 1

## 2014-09-02 MED ORDER — DIPHENHYDRAMINE HCL 50 MG/ML IJ SOLN
50.0000 mg | Freq: Once | INTRAMUSCULAR | Status: AC
  Administered 2014-09-02: 50 mg via INTRAMUSCULAR
  Filled 2014-09-02: qty 1

## 2014-09-02 MED ORDER — ENSURE COMPLETE PO LIQD: 237.0000 mL | Freq: Two times a day (BID) | ORAL | Status: DC

## 2014-09-02 MED ORDER — LORAZEPAM 2 MG/ML IJ SOLN
INTRAMUSCULAR | Status: AC
  Administered 2014-09-02: 2 mg via INTRAMUSCULAR
  Filled 2014-09-02: qty 1

## 2014-09-02 MED ORDER — ZIPRASIDONE MESYLATE 20 MG IM SOLR
20.0000 mg | Freq: Once | INTRAMUSCULAR | Status: AC
  Administered 2014-09-02: 20 mg via INTRAMUSCULAR
  Filled 2014-09-02: qty 20

## 2014-09-02 MED ORDER — ZIPRASIDONE MESYLATE 20 MG IM SOLR
INTRAMUSCULAR | Status: AC
  Administered 2014-09-02: 20 mg via INTRAMUSCULAR
  Filled 2014-09-02: qty 20

## 2014-09-02 NOTE — Progress Notes (Signed)
Patient ID: Natalie [name], female   DOB: 04-24-1991, 23 y.o.   MRN: [id number] Nursing shift note: d: pt continues to be psychotic, preoccupied and wandering up and down the hall. She is selectively mute at times. She did attempt to make a ph call to her family. A: RN solicited medications for this pt thru the MD and he wrote orders for geodon 20 mg, benadryl 50 mg, and ativan 2 mg all IM injections. R: pt took the injections without any resistance. On her inventory sheet she wrote: slept good with sleep medication, appetite poor, energy low, concentration good with her depression, hopelessness and anxiety all at 6. No physical pain. Her goal today is " I want to feel better about going home". RN will monitor and Q 15 min ck's continue.

## 2014-09-02 NOTE — Progress Notes (Signed)
Patient ID: ARELYS GLASSCO, female   DOB: January 16, 1991, 23 y.o.   MRN: [id number] Psychoeducational Group Note  Date:  09/02/2014 Time:  1030am  Group Topic/Focus:  Making Healthy Choices:   The focus of this group is to help patients identify negative/unhealthy choices they were using prior to admission and identify positive/healthier coping strategies to replace them upon discharge.  Participation Level:  Did Not Attend  Participation Quality:    Affect:  Cognitive:  Insight:  Engagement in Group:  Additional Comments:  Pt did not attend healthy support systems- she was heavily medicated.   Valente David 09/02/2014,3:21 PM

## 2014-09-02 NOTE — Progress Notes (Signed)
Patient ID: TABBY BEASTON, female   DOB: [birth year], 23 y.o.   MRN: [id number] Psychoeducational Group Note  Date:  09/02/2014 Time:  1015am  Group Topic/Focus:  Making Healthy Choices:   The focus of this group is to help patients identify negative/unhealthy choices they were using prior to admission and identify positive/healthier coping strategies to replace them upon discharge.  Participation Level:  Did Not Attend  Participation Quality:    Affect:  Cognitive:  Insight:   Engagement in Group:Additional Comments:  Inventory group- pt was heavily medicated.   Valente David 09/02/2014,3:21 PM

## 2014-09-02 NOTE — Progress Notes (Signed)
Adult Psychoeducational Group Note  Date:  09/02/2014 Time:  9:35 PM  Group Topic/Focus:  Wrap-Up Group:   The focus of this group is to help patients review their daily goal of treatment and discuss progress on daily workbooks.  Participation Level:  Active  Participation Quality:  Attentive  Affect:  Flat  Cognitive:  Appropriate  Insight: Lacking  Engagement in Group:  Improving  Modes of Intervention:  Discussion  Additional Comments:  The patient expressed that she hopes to get better on this admission.The patient also said that she was happy to have visitors.  Octavio Manns 09/02/2014, 9:35 PM

## 2014-09-02 NOTE — Progress Notes (Signed)
[facility] MD Progress Note  09/02/2014 2:48 PM Natalie [name]  MRN:  [id number] Subjective:  Patient states she feels much better today.  She is alert, oriented x3 and more coherent.  Patient  States she slept very well last night and that her appetite is improving.  She says she came to the hospital for her medications to be adjusted.  She says she was had poor concentration, racing thoughts and was talking non stop at home.  She states that she is getting calmer and that her concentration is gradually improving.  She states she attends group and that group participation helps her learn from others how to manage her illness.  She states that group helps her realize other people have the same mental issues.  She denies SI/HI/AVH.  We will continue to offer her her medications and safety.  Discharge planning in progress.    Diagnosis:   DSM5: Schizophrenia Disorders:   Obsessive-Compulsive Disorders:   Trauma-Stressor Disorders:   Substance/Addictive Disorders:   Depressive Disorders:  Bipolar disorder depressed, with Psychotic features Total Time spent with patient: 30 minutes  Axis I: Bipolar disorder, depressed with Psychotic features Axis II: Deferred Axis III:  Past Medical History  Diagnosis Date  . Psychiatric diagnosis   . Chronic bipolar disorder   . OCD (obsessive compulsive disorder)   . H/O suicide attempt 05-2013    OD  . Anxiety   . Depression    Axis IV: other psychosocial or environmental problems and problems related to social environment Axis V: 31-40 impairment in reality testing  ADL's:  Intact  Sleep: Good  Appetite:  Good  Suicidal Ideation:  Denies Homicidal Ideation:  Denies  Psychiatric Specialty Exam: Physical Exam  ROS  Blood pressure 105/68, pulse 98, temperature 98.3 F (36.8 C), temperature source Oral, resp. rate 16, height 5\' 5"  (1.651 m), weight 68.04 kg (150 lb).Body mass index is 24.96 kg/(m^2).  General Appearance: Casual and Fairly  Groomed  ::  Fair  Speech:  Clear and Coherent  Volume:  Normal  Mood:  Depressed  Affect:  Congruent, Depressed and Flat  Thought Process:  Coherent and Goal Directed  Orientation:  Full (Time, Place, and Person)  Thought Content:  WDL  Suicidal Thoughts:  No  Homicidal Thoughts:  No  Memory:  Immediate;   Good Recent;   Good Remote;   Good  Judgement:  Poor  Insight:  Good  Psychomotor Activity:  Decreased  Concentration:  Fair  Recall:  NA  Fund of Knowledge:Good  Language: Good  Akathisia:  NA  Handed:  Right  AIMS (if indicated):     Assets:  Desire for Improvement  Sleep:  Number of Hours: 6.5   Musculoskeletal: Strength & Muscle Tone: within normal limits Gait & Station: normal Patient leans: N/A  Current Medications: Current Facility-Administered Medications  Medication Dose Route Frequency Provider Last Rate Last Dose  . acetaminophen (TYLENOL) tablet 650 mg  650 mg Oral Q6H PRN [id number], NP      . alum & mag hydroxide-simeth (MAALOX/MYLANTA) 200-200-20 MG/5ML suspension 30 mL  30 mL Oral Q4H PRN 06-08-2001, NP      . alum & mag hydroxide-simeth (MAALOX/MYLANTA) 200-200-20 MG/5ML suspension 30 mL  30 mL Oral Q4H PRN Evanna 06-08-2001, NP      . carbamazepine (TEGRETOL XR) 12 hr tablet 200 mg  200 mg Oral BID Kabrina Christiano   200 mg at 09/02/14 0902  . diphenhydrAMINE (BENADRYL) 50 MG/ML injection           .  feeding supplement (ENSURE COMPLETE) (ENSURE COMPLETE) liquid 237 mL  237 mL Oral BID BM Elyse A Shearer, RD      . ibuprofen (ADVIL,MOTRIN) tablet 600 mg  600 mg Oral Q8H PRN [name], NP      . LORazepam (ATIVAN) tablet 1 mg  1 mg Oral Q8H PRN [name], NP   1 mg at 09/01/14 2013  . lurasidone (LATUDA) tablet 40 mg  40 mg Oral Q breakfast Shley Dolby   40 mg at 09/02/14 0758  . magnesium hydroxide (MILK OF MAGNESIA) suspension 30 mL  30 mL Oral Daily PRN [name], NP      . magnesium hydroxide (MILK OF MAGNESIA)  suspension 30 mL  30 mL Oral Daily PRN [name], NP        Lab Results: No results found for this or any previous visit (from the past 48 hour(s)).  Physical Findings: AIMS: Facial and Oral Movements Muscles of Facial Expression: None, normal Lips and Perioral Area: None, normal Jaw: None, normal Tongue: None, normal,Extremity Movements Upper (arms, wrists, hands, fingers): None, normal Lower (legs, knees, ankles, toes): None, normal, Trunk Movements Neck, shoulders, hips: None, normal, Overall Severity Severity of abnormal movements (highest score from questions above): None, normal Incapacitation due to abnormal movements: None, normal Patient's awareness of abnormal movements (rate only patient's report): No Awareness, Dental Status Current problems with teeth and/or dentures?: No Does patient usually wear dentures?: No  CIWA:    COWS:     Treatment Plan Summary: Daily contact with patient to assess and evaluate symptoms and progress in treatment Medication management  Plan:  Continue with plan of care Continue crisis management Encourage to participate in group and individual sessions Continue medication management/ and review as needed Medication management to treat current mood problems  Continue taking Tegretol XR 200 MG PO bid for mood stabilization  Latuda 40 mg po with breakfast for mood start tomorrow.  Ativan 1 mg po every 8 hours as needed for agitation  Group counseling sessions and activities.  Discharge  Plan in progress Address health issues /V/S as needed   Medical Decision Making Problem Points:  Established problem, stable/improving (1) Data Points:  Review and summation of old records (2)  I certify that inpatient services furnished can reasonably be expected to improve the patient's condition.   Dahlia Byes, C   PMHNP-BC  09/02/2014, 2:48 PM    Patient seen, evaluated and I agree with notes by Nurse Practitioner. Thedore Mins,  MD

## 2014-09-02 NOTE — Progress Notes (Signed)
D: Pt has anxious affect and mood.  Pt denies SI/HI, denies hallucinations.  Pt minimally interacts with peers and attended evening group.  Pt periodically paces the hallway and stops and watches when a peer or a staff member walks by.  At the beginning of the shift, pt asked "what time do I get my meds?" multiple times. A: Safety maintained.  Encouraged pt to report needs and concerns.  PRN medication for anxiety administered, see flowsheet.  Educated pt on current medication regimen.   R: Pt verbally contracts for safety and is in no distress.  Pt verbalized understanding when writer informed her she had no scheduled HS medications.  Will continue to monitor and assess for safety.

## 2014-09-02 NOTE — Progress Notes (Signed)
NUTRITION ASSESSMENT  Pt identified as at risk on the Malnutrition Screen Tool  INTERVENTION: 1. Educated patient on the importance of nutrition and encouraged intake of food and beverages. 2. Discussed weight goals. 3. Supplements: Ensure Complete BID  NUTRITION DIAGNOSIS: Unintentional weight loss related to sub-optimal intake as evidenced by pt report.   Goal: Pt to meet >/= 90% of their estimated nutrition needs.  Monitor:  PO intake  Assessment:  Patient with bipolar disorder. Unable to obtain much information from patient. Per chart, her appetite is poor, and she's lost about 90 pounds since March. During previous admission, she was receiving Ensure Complete shakes. Will order now.   23 y.o. female  Height: Ht Readings from Last 1 Encounters:  08/31/14 5\' 5"  (1.651 m)    Weight: Wt Readings from Last 1 Encounters:  08/31/14 150 lb (68.04 kg)    Weight Hx: Wt Readings from Last 10 Encounters:  08/31/14 150 lb (68.04 kg)  08/30/14 175 lb 4 oz (79.493 kg)  07/12/14 190 lb (86.183 kg)  06/13/14 186 lb (84.369 kg)  06/12/14 186 lb (84.369 kg)  05/29/14 195 lb (88.451 kg)  05/26/14 223 lb (101.152 kg)  02/13/14 240 lb (108.863 kg)  12/22/13 239 lb (108.41 kg)    BMI:  Body mass index is 24.96 kg/(m^2). Pt meets criteria for normal weight based on current BMI.  Estimated Nutritional Needs: Kcal: 25-30 kcal/kg Protein: > 1 gram protein/kg Fluid: 1 ml/kcal  Diet Order: Cardiac Pt is also offered choice of unit snacks mid-morning and mid-afternoon.  Pt is eating as desired.   Lab results and medications reviewed.   12/24/13, RD, LDN Pager #: [phone] After-Hours Pager #: [phone]

## 2014-09-02 NOTE — Plan of Care (Signed)
Problem: Ineffective individual coping Goal: STG: Patient will remain free from self harm Outcome: Progressing Pt has not harmed herself this shift.       

## 2014-09-02 NOTE — [facility] Counselor (Signed)
Clinical Social Work Note  This morning, met with patient to attempt to perform Psychosocial Assessment.  She was experiencing extreme thought blocking and was very focused on whether her husband is going to file for divorce.  She wanted to call him, but was unable to make the decision to do so right then.  Again, the thought blocking interfered extensively with her ability to move forward, and she was unable to complete the call or the PSA at that time.   Grossman-Orr, LCSW 09/02/2014, 2:53 PM    

## 2014-09-02 NOTE — [facility] Group Notes (Signed)
[facility] Group Notes:  (Clinical Social Work)  09/02/2014   11:15am-12:00pm  Summary of Progress/Problems:  The main focus of today's process group was to listen to a variety of genres of music and to identify that different types of music provoke different responses.  The patient then was able to identify personally what was soothing for them, as well as energizing.  Handouts were used to record feelings evoked, as well as how patient can personally use this knowledge in sleep habits, with depression, and with other symptoms.  The patient was unable to respond to how the first song made her feel, and then went to sleep for the remainder of group.  Type of Therapy:  Music Therapy   Participation Level:  Minimal  Participation Quality:  Drowsy  Affect:  Flat  Cognitive:  Disorganized  Insight:  None  Engagement in Therapy:  Poor  Modes of Intervention:   Activity, Exploration  Ambrose Mantle, LCSW 09/02/2014, 12:30pm

## 2014-09-03 DIAGNOSIS — F42 Obsessive-compulsive disorder: Secondary | ICD-10-CM

## 2014-09-03 DIAGNOSIS — F315 Bipolar disorder, current episode depressed, severe, with psychotic features: Principal | ICD-10-CM

## 2014-09-03 MED ORDER — LURASIDONE HCL 80 MG PO TABS
80.0000 mg | ORAL_TABLET | Freq: Every day | ORAL | Status: DC
  Filled 2014-09-03 (×2): qty 1

## 2014-09-03 MED ORDER — CARBAMAZEPINE ER 100 MG PO TB12
300.0000 mg | ORAL_TABLET | Freq: Two times a day (BID) | ORAL | Status: DC
  Administered 2014-09-03 – 2014-09-14 (×13): 300 mg via ORAL
  Filled 2014-09-03 (×4): qty 3
  Filled 2014-09-03: qty 84
  Filled 2014-09-03 (×18): qty 3
  Filled 2014-09-03: qty 84
  Filled 2014-09-03 (×3): qty 3

## 2014-09-03 MED ORDER — HYDROXYZINE HCL 25 MG PO TABS
25.0000 mg | ORAL_TABLET | Freq: Four times a day (QID) | ORAL | Status: DC | PRN
  Filled 2014-09-03: qty 30

## 2014-09-03 NOTE — Progress Notes (Signed)
The focus of this group is to help patients review their daily goal of treatment and discuss progress on daily workbooks. Pt attended the evening group session but refused to sit down or respond to the Writer and left after ten minutes.

## 2014-09-03 NOTE — Plan of Care (Signed)
Problem: Alteration in thought process Goal: LTG-Patient has not harmed self or others in at least 2 days Outcome: Completed/Met Date Met:  09/03/14 Pt has remained free of physical aggression towards self and others since admission.

## 2014-09-03 NOTE — Progress Notes (Signed)
D: Patient presents with a flat, preoccupied affect and mood. She reported on the self inventory sheet that sleep and ability to concentrate are good, appetite is fair and energy level is normal. Patient rates depression/feelings of hopelessness "0" and anxiety "6". Writer observes that the patient walks the hall throughout the day with a slow pace and sullen appearance. She's attending groups and visible in the milieu. Cautious, but compliant with medications at this time.  A: Support and encouragement provided to patient. Scheduled medications administered per MD orders. Maintain Q15 minute checks for safety.  R: Patient receptive. Denies SI/HI/AVH. Patient remains safe.

## 2014-09-03 NOTE — Progress Notes (Signed)
Patient ID: Natalie [name], female   DOB: Feb 27, 1991, 23 y.o.   MRN: [id number]  D: Writer was in the process of speaking to another pt. Pt approached the writer and stated, "I need to talk to you". Writer followed pt to her room, however pt would not come into the room. Instead the pt stood in the hall, at her door.  Pt stated, "I came here to get better because I wasn't feeling well". When asked how she's feeling now, pt stated, "much better". When asked if she had any questions or concerns, pt asked, "Why my family not answering my calls". Then pt stated she was able to speak to her mother. Stated she loves her husband, but she doesn't "grab him like that".  After discussing how much she loves her husband, pt stated, "I get really irritated being in here".   A:  Support and encouragement was offered. 15 min checks continued for safety.  R: Pt remains safe.

## 2014-09-03 NOTE — [facility] Group Notes (Signed)
[facility] LCSW Aftercare Discharge Planning Group Note   09/03/2014 5:06 PM  Participation Quality:  I would not let her come into group as she would not commit to sitting in a chair and staying for the whole time.    Natalie [name] B
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
## 2014-09-03 NOTE — Progress Notes (Signed)
[facility] MD Progress Note  09/03/2014 2:47 PM Natalie [name]  MRN:  [id number] Subjective:  Patient states she needed to get her medications figured out and that is why she is here".   Objective: Patient seen and chart reviewed. Patient appears to be slowed down with psychomotor retardation. Patient continues to be disorganized, paranoid. Patient denies SI/HI/AH/VH. Patient denies any side effects of medications. Patient reports not knowing where to go and reports that she does not want to be a burden on her boyfriend who has diabetes.     Diagnosis:   DSM5: Primary Psychiatric Diagnosis: Bipolar disorder, type I ,most recent episode depressed,severe with psychotic features.   Secondary Psychiatric Diagnosis: OCD per history   Non Psychiatric Diagnosis: None      Total Time spent with patient: 30 minutes   Past Medical History  Diagnosis Date  . Psychiatric diagnosis   . Chronic bipolar disorder   . OCD (obsessive compulsive disorder)   . H/O suicide attempt 05-2013    OD  . Anxiety   . Depression     ADL's:  Intact  Sleep: Good  Appetite:  Good   Psychiatric Specialty Exam: Physical Exam  ROS  Blood pressure 120/82, pulse 79, temperature 98.3 F (36.8 C), temperature source Oral, resp. rate 16, height 5\' 5"  (1.651 m), weight 68.04 kg (150 lb).Body mass index is 24.96 kg/(m^2).  General Appearance: Casual and Fairly Groomed  ::  Fair  Speech:  Slow  Volume:  Normal  Mood:  Depressed  Affect:  Congruent, Depressed and Flat  Thought Process:  Coherent and Goal Directed  Orientation:  Full (Time, Place, and Person)  Thought Content:  WDL  Suicidal Thoughts:  No  Homicidal Thoughts:  No  Memory:  Immediate;   Good Recent;   Good Remote;   Good  Judgement:  Poor  Insight:  Good  Psychomotor Activity:  Decreased  Concentration:  Fair  Recall:  NA  Fund of Knowledge:Good  Language: Good  Akathisia:  NA  Handed:  Right  AIMS (if indicated):      Assets:  Desire for Improvement  Sleep:  Number of Hours: 5.5   Musculoskeletal: Strength & Muscle Tone: within normal limits Gait & Station: normal Patient leans: N/A  Current Medications: Current Facility-Administered Medications  Medication Dose Route Frequency Provider Last Rate Last Dose  . acetaminophen (TYLENOL) tablet 650 mg  650 mg Oral Q6H PRN [id number], NP      . alum & mag hydroxide-simeth (MAALOX/MYLANTA) 200-200-20 MG/5ML suspension 30 mL  30 mL Oral Q4H PRN 06-08-2001, NP      . alum & mag hydroxide-simeth (MAALOX/MYLANTA) 200-200-20 MG/5ML suspension 30 mL  30 mL Oral Q4H PRN Evanna 06-08-2001, NP      . carbamazepine (TEGRETOL XR) 12 hr tablet 300 mg  300 mg Oral BID Janann August, MD      . feeding supplement (ENSURE COMPLETE) (ENSURE COMPLETE) liquid 237 mL  237 mL Oral BID BM Elyse A Shearer, RD      . ibuprofen (ADVIL,MOTRIN) tablet 600 mg  600 mg Oral Q8H PRN [name], NP      . LORazepam (ATIVAN) tablet 1 mg  1 mg Oral Q8H PRN [name], NP   1 mg at 09/01/14 2013  . [START ON 09/04/2014] lurasidone (LATUDA) tablet 80 mg  80 mg Oral Q breakfast Melisssa Donner, MD      . magnesium hydroxide (MILK OF MAGNESIA) suspension 30 mL  30  mL Oral Daily PRN [name], NP   30 mL at 09/02/14 2212  . magnesium hydroxide (MILK OF MAGNESIA) suspension 30 mL  30 mL Oral Daily PRN [name], NP        Lab Results: No results found for this or any previous visit (from the past 48 hour(s)).  Physical Findings: AIMS: Facial and Oral Movements Muscles of Facial Expression: None, normal Lips and Perioral Area: None, normal Jaw: None, normal Tongue: None, normal,Extremity Movements Upper (arms, wrists, hands, fingers): None, normal Lower (legs, knees, ankles, toes): None, normal, Trunk Movements Neck, shoulders, hips: None, normal, Overall Severity Severity of abnormal movements (highest score from questions above): None, normal Incapacitation due to  abnormal movements: None, normal Patient's awareness of abnormal movements (rate only patient's report): No Awareness, Dental Status Current problems with teeth and/or dentures?: No Does patient usually wear dentures?: No  CIWA:    COWS:     Treatment Plan Summary: Daily contact with patient to assess and evaluate symptoms and progress in treatment Medication management  Plan:  Continue with plan of care Continue crisis management Encourage to participate in group and individual sessions Continue medication management/ and review as needed Medication management to treat current mood problems  IncreaseTegretol XR to  300 MG PO bid for mood stabilization  Increase Latuda to 80 mg po with breakfast for mood start tomorrow.  Will DC ativan and add Vistaril 25 mg po q6h prn. Group counseling sessions and activities.  Discharge  Plan in progress Address health issues /V/S as needed   Medical Decision Making Problem Points:  Established problem, stable/improving (1) Data Points:  Review and summation of old records (2) Review of medication regiment & side effects (2)  I certify that inpatient services furnished can reasonably be expected to improve the patient's condition.   Rylei Codispoti  MD 09/03/2014, 2:47 PM

## 2014-09-03 NOTE — [facility] Group Notes (Signed)
[facility] LCSW Group Therapy  09/03/2014 1:15 pm  Type of Therapy: Process Group Therapy  Participation Level:  Active  Participation Quality:  Appropriate  Affect:  Flat  Cognitive:  Oriented  Insight:  Improving  Engagement in Group:  Limited  Engagement in Therapy:  Limited  Modes of Intervention:  Activity, Clarification, Education, Problem-solving and Support  Summary of Progress/Problems: Today's group addressed the issue of overcoming obstacles.  Patients were asked to identify their biggest obstacle post d/c that stands in the way of their on-going success, and then problem solve as to how to manage this.  I allowed Pavielle to come and go as she pleased.  I even let the door open initially so she could come in as she was standing on the threshold.  She came in and out several times.  Did not sit, but stood with her back to the group looking at the door or out the window to the room from the hallway.  Was not disruptive other than opening and closing the door.  Daryel Gerald B 09/03/2014   5:07 PM

## 2014-09-03 NOTE — Progress Notes (Signed)
D: Pt continues to be preoccupied.  She is slow to respond to questions or prompts at times.  Pt reports "it was an okay day.  I'm happy my mom and my husband came to visit.  It was a good visit."  Pt denies SI/HI, denies hallucinations.   A: Met with pt 1:1 and provided encouragement and support.  Safety maintained.   R: Pt attended evening group.  Pt verbally contracts for safety and is in no distress.  Will continue to monitor and assess for safety.

## 2014-09-04 LAB — HEMOGLOBIN A1C
Hgb A1c MFr Bld: 5.1 % (ref <5.7)
Mean Plasma Glucose: 100 mg/dL (ref <117)

## 2014-09-04 MED ORDER — TRAZODONE HCL 100 MG PO TABS
100.0000 mg | ORAL_TABLET | Freq: Every day | ORAL | Status: DC
  Administered 2014-09-05: 100 mg via ORAL
  Filled 2014-09-04 (×2): qty 1

## 2014-09-04 MED ORDER — LURASIDONE HCL 80 MG PO TABS
80.0000 mg | ORAL_TABLET | Freq: Once | ORAL | Status: AC
  Administered 2014-09-04: 80 mg via ORAL
  Filled 2014-09-04: qty 1

## 2014-09-04 MED ORDER — LURASIDONE HCL 40 MG PO TABS
100.0000 mg | ORAL_TABLET | Freq: Every day | ORAL | Status: DC
  Administered 2014-09-05: 100 mg via ORAL
  Filled 2014-09-04 (×2): qty 3

## 2014-09-04 NOTE — [facility] Group Notes (Signed)
[facility] LCSW Group Therapy  09/04/2014 1:32 PM  Type of Therapy:  Group Therapy  Participation Level:  Did Not Attend-invited, but did not attend -pt pacing halls upon approach.   Smart, [name] LCSWA  09/04/2014, 1:32 PM
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
## 2014-09-04 NOTE — Progress Notes (Signed)
Pt has been up all night slowly walking the hall.  MHTs report that pt has not sat or lay down all evening nor night.  Pt also refused to have her fasting lab drawn this morning.  Pt did tell writer that she did not like her room and wants another room.
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
## 2014-09-04 NOTE — Progress Notes (Addendum)
Numerous times this morning RN has attempted to give patient's her morning medications.  MD talked with patient several times and also MHTs(2).  Patient would agree to take medication and then refused a few second later.  Patient has been walking, standing in hallway with head lowered and watching staff.  Has refused to attend groups.  Stated she would take medications and then take shower but then refused again.  Patient refused EKG.  D:  Patient has refused to fill out self inventory sheet, has refused to answer questions, refused to attend groups.  Patient does stand in hallway watching staff and patients. A:  Morning medications were administered at 1150 after MD, nurse, MHT(s) talked to patient several times. R: Emotional support and encouragement given patient continually throughout morning.  Safety maintained with 15 minute checks.  1700  Patient very reluctantly took 1700 medications.

## 2014-09-04 NOTE — Progress Notes (Signed)
The focus of this group is to educate the patient on the purpose and policies of crisis stabilization and provide a format to answer questions about their admission.  The group details unit policies and expectations of patients while admitted.  Patient did not attend nurse education orientation group this morning.  Patient stayed in room/hallway.

## 2014-09-04 NOTE — Progress Notes (Signed)
Adult Psychoeducational Group Note  Date:  09/04/2014 Time:  9:48 PM  Group Topic/Focus:  Wrap-Up Group:   The focus of this group is to help patients review their daily goal of treatment and discuss progress on daily workbooks.  Participation Level:  Minimal  Participation Quality:  Drowsy  Affect:  Flat  Cognitive:  Lacking  Insight: Lacking  Engagement in Group:  Lacking  Modes of Intervention:  Socialization and Support  Additional Comments:  Patient attended and participated in group tonight. She reports that today was different for her. She learn today that "you have to stop playing and take things more serious". Her mother and brother visited with her today. Today she also learn that "you cannot be taking advantage of situations when others could benefit from it". The patient advised that wellness to her means recovery, starting from the bottom up.  Lita [facility] 09/04/2014, 9:48 PM

## 2014-09-05 MED ORDER — LURASIDONE HCL 80 MG PO TABS
120.0000 mg | ORAL_TABLET | Freq: Every day | ORAL | Status: DC
  Filled 2014-09-05 (×3): qty 1

## 2014-09-05 MED ORDER — BENZTROPINE MESYLATE 1 MG PO TABS
1.0000 mg | ORAL_TABLET | Freq: Every day | ORAL | Status: DC
Start: 2014-09-06 — End: 2014-09-09
  Filled 2014-09-05 (×7): qty 1

## 2014-09-05 MED ORDER — TRAZODONE HCL 150 MG PO TABS
150.0000 mg | ORAL_TABLET | Freq: Every day | ORAL | Status: DC
  Administered 2014-09-06 (×2): 150 mg via ORAL
  Filled 2014-09-05 (×5): qty 1

## 2014-09-05 MED ORDER — OLANZAPINE 5 MG PO TBDP
5.0000 mg | ORAL_TABLET | Freq: Four times a day (QID) | ORAL | Status: DC | PRN
  Filled 2014-09-05: qty 1

## 2014-09-05 NOTE — Plan of Care (Signed)
Problem: Alteration in thought process Goal: STG-Patient is able to follow short directions Outcome: Not Progressing Patient is not following directions well.

## 2014-09-05 NOTE — [facility] Group Notes (Signed)
[facility] LCSW Aftercare Discharge Planning Group Note   09/05/2014 9:50 AM  Participation Quality: Invited. Did not attend.    [name],Pieper Kasik

## 2014-09-05 NOTE — [facility] Group Notes (Signed)
Adult Psychoeducational Group Note  Date:  09/05/2014 Time:  8:59 PM  Group Topic/Focus:  Wrap-Up Group:   The focus of this group is to help patients review their daily goal of treatment and discuss progress on daily workbooks.  Participation Level:  Did Not Attend  Participation Quality:  Noe  Affect:  None  Cognitive:  None  Insight: None  Engagement in Group:  None  Modes of Intervention:  Discussion  Additional Comments:  Josey did not attend group.  Caroll Rancher A 09/05/2014, 8:59 PM
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
## 2014-09-05 NOTE — Progress Notes (Signed)
Patient ID: YENY SCHMOLL, female   DOB: 1991-10-28, 23 y.o.   MRN: [id number]  D: Pt wouldn't discuss any info in depth with the writer. However, pt did accept scheduled trazodone with some prompting.   A:  Support and encouragement was offered. 15 min checks continued for safety.  R: Pt remains safe.

## 2014-09-05 NOTE — [facility] Group Notes (Signed)
[facility] LCSW Group Therapy  09/05/2014 2:37 PM  Type of Therapy:  Group Therapy  Participation Level: Invited. Did Not Attend   [name],Natalie [name] 09/05/2014, 2:37 PM

## 2014-09-05 NOTE — Progress Notes (Signed)
Patient ID: Natalie [name], female   DOB: Apr 27, 1991, 23 y.o.   MRN: [id number]  D: Pt. Denies SI/HI and A/V Hallucinations to this writer although patient may be experiencing internal stimuli. Patient is irritable towards Clinical research associate and evasive. Patient continued to be paranoid and irritable. Patient rates depression, anxiety, and hopelessness at 7/10 for the day.  Patient does not report any pain or discomfort at this time.   A: Support and encouragement provided to the patient. Scheduled medications administered to patient although patient was initially resistant.  R: Patient is continues to be minimal and forwards little. Patient can be hostile at times. Patient refuses to do EKG. Patient encouraged to speak but patient has selective mutism. Patient is mainly seen in her room but is in the milieu at times. Q15 minute checks are maintained for safety.

## 2014-09-06 MED ORDER — LAMOTRIGINE 25 MG PO TABS
25.0000 mg | ORAL_TABLET | Freq: Every day | ORAL | Status: DC
  Administered 2014-09-06: 25 mg via ORAL
  Filled 2014-09-06 (×2): qty 1

## 2014-09-06 NOTE — Progress Notes (Addendum)
Patient ID: Natalie [name], female   DOB: [birth year], 23 y.o.   MRN: [id number]   DAR: Pt. Denies SI/HI and A/V Hallucinations to this Clinical research associate. Patient does not report any pain or discomfort at this time. Patient continues to be irritable towards staff members and continues to refuse to take her medications although she has been educated about them. Patient remains paranoid. Patient continues to refuse EKG. Support and encouragement provided to the patient however patient refuses it. Patient is seen in the phone room speaking with her husband during a meeting shouting at him and appearing agitated. Patient is able to calm down but remains irritable. Chief Operating Officer have spoke to patient about  Importance of medication regimen but patient is resistant. Q15 minute checks are maintained for safety.

## 2014-09-06 NOTE — Plan of Care (Signed)
Problem: Alteration in thought process Goal: LTG-Patient verbalizes understanding importance med regimen (Patient verbalizes understanding of importance of medication regimen and need to continue outpatient care.)  Outcome: Not Progressing Although staff has provided education and positive reinforcement patient continues to deny the need for medications. Patient says all the medications do is make her sleep. Minimizing her illness at this time.

## 2014-09-06 NOTE — Progress Notes (Addendum)
[facility] MD Progress Note  09/06/2014 1:08 PM Natalie [name]  MRN:  [id number] Subjective:  Patient states "I know nobody cares about me". Objective: Patient seen and chart reviewed. Patient today appeared to be very irritable . Patient seems to be upset about family not showing her any attention. Patient however unable to elaborate. Patient has been refusing to take her medications. Patient's spouse was asked to come for a family meeting. Patient's spouse as well as CSW and Probation officer met initially to discuss patient's treatment plan. Thereafter patient was allowed to join. Patient had a very intense conversation with her spouse ,blaming him for lying ion her ,teaming up with his and her family as well as not caring about her. Patient went to on to be very angry at her spouse . Thereafter patient was seen to crying in her room ,but would not communicate. Patient however denied SI/HI/AH/VH.    Diagnosis:   DSM5: Primary Psychiatric Diagnosis: Bipolar disorder, type I ,most recent episode depressed,severe with psychotic features.   Secondary Psychiatric Diagnosis: OCD per history   Non Psychiatric Diagnosis: None      Total Time spent with patient: 30 minutes   Past Medical History  Diagnosis Date  . Psychiatric diagnosis   . Chronic bipolar disorder   . OCD (obsessive compulsive disorder)   . H/O suicide attempt 05-2013    OD  . Anxiety   . Depression     ADL's:  Intact  Sleep: Poor  Appetite:  Good   Psychiatric Specialty Exam: Physical Exam  ROS  Blood pressure 122/75, pulse 70, temperature 97.9 F (36.6 C), temperature source Oral, resp. rate 16, height 5' 5" (1.651 m), weight 68.04 kg (150 lb).Body mass index is 24.96 kg/(m^2).  General Appearance: Casual  Eye Contact::  Fair  Speech:  Normal Rate   Volume:  Normal  Mood:  Angry and Depressed  Affect:  Congruent, Depressed, Labile and Tearful  Thought Process:  Coherent and Goal Directed  Orientation:   Full (Time, Place, and Person)  Thought Content:  Delusions and Paranoid Ideation   Suicidal Thoughts:  No  Homicidal Thoughts:  No  Memory:  Immediate;   Good Recent;   Good Remote;   Good  Judgement:  Poor  Insight:  Good  Psychomotor Activity:  Decreased  Concentration:  Fair  Recall:  NA  Fund of Knowledge:Fair  Language: Good  Akathisia:  NA  Handed:  Right  AIMS (if indicated):     Assets:  Desire for Improvement  Sleep:  Number of Hours: 4.75   Musculoskeletal: Strength & Muscle Tone: within normal limits Gait & Station: normal Patient leans: N/A  Current Medications: Current Facility-Administered Medications  Medication Dose Route Frequency Provider Last Rate Last Dose  . acetaminophen (TYLENOL) tablet 650 mg  650 mg Oral Q6H PRN Waylan Boga, NP      . alum & mag hydroxide-simeth (MAALOX/MYLANTA) 200-200-20 MG/5ML suspension 30 mL  30 mL Oral Q4H PRN Waylan Boga, NP      . alum & mag hydroxide-simeth (MAALOX/MYLANTA) 200-200-20 MG/5ML suspension 30 mL  30 mL Oral Q4H PRN Evanna Glenda Chroman, NP      . benztropine (COGENTIN) tablet 1 mg  1 mg Oral Q breakfast  , MD      . carbamazepine (TEGRETOL XR) 12 hr tablet 300 mg  300 mg Oral BID Ursula Alert, MD   300 mg at 09/05/14 1731  . feeding supplement (ENSURE COMPLETE) (ENSURE COMPLETE) liquid 237 mL  237  mL Oral BID BM Elyse A Shearer, RD      . hydrOXYzine (ATARAX/VISTARIL) tablet 25 mg  25 mg Oral Q6H PRN Ursula Alert, MD      . ibuprofen (ADVIL,MOTRIN) tablet 600 mg  600 mg Oral Q8H PRN Waylan Boga, NP      . lurasidone (LATUDA) tablet 120 mg  120 mg Oral Q breakfast Darriel Utter, MD      . magnesium hydroxide (MILK OF MAGNESIA) suspension 30 mL  30 mL Oral Daily PRN Waylan Boga, NP   30 mL at 09/02/14 2212  . magnesium hydroxide (MILK OF MAGNESIA) suspension 30 mL  30 mL Oral Daily PRN Evanna Cori Burkett, NP      . OLANZapine zydis (ZYPREXA) disintegrating tablet 5 mg  5 mg Oral Q6H PRN Ursula Alert, MD      . traZODone (DESYREL) tablet 150 mg  150 mg Oral QHS Ursula Alert, MD   150 mg at 09/06/14 0052    Lab Results:  No results found for this or any previous visit (from the past 48 hour(s)).  Physical Findings: AIMS: Facial and Oral Movements Muscles of Facial Expression: None, normal Lips and Perioral Area: None, normal Jaw: None, normal Tongue: None, normal,Extremity Movements Upper (arms, wrists, hands, fingers): None, normal Lower (legs, knees, ankles, toes): None, normal, Trunk Movements Neck, shoulders, hips: None, normal, Overall Severity Severity of abnormal movements (highest score from questions above): None, normal Incapacitation due to abnormal movements: None, normal Patient's awareness of abnormal movements (rate only patient's report): No Awareness, Dental Status Current problems with teeth and/or dentures?: No Does patient usually wear dentures?: No  CIWA:  CIWA-Ar Total: 10 COWS:  COWS Total Score: 4  Treatment Plan Summary: Daily contact with patient to assess and evaluate symptoms and progress in treatment Medication management  Plan:  Continue with plan of care Continue crisis management Encourage to participate in group and individual sessions Continue medication management/ and review as needed Medication management to treat current mood problems  Continue Tegretol XR   300 MG PO bid for mood stabilization . Will add lamictal along with her tegretol for better symptom control. Will continue Latuda 120 mg po with breakfast.. Will continue trazodone  150 mg po qhs for sleep. Will continue Vistaril 25 mg po q6h prn. Group counseling sessions and activities.  Discharge  Plan in progress Address health issues /V/S as needed   Medical Decision Making Problem Points:  Established problem, stable/improving (1) Data Points:  Review and summation of old records (2) Review of medication regiment & side effects (2)  I certify that inpatient  services furnished can reasonably be expected to improve the patient's condition.   Bethzaida Boord  MD 09/06/2014, 1:08 PM

## 2014-09-06 NOTE — [facility] Group Notes (Signed)
[facility] Group Notes:  (Counselor/Nursing/MHT/Case Management/Adjunct)  09/06/2014 1:15PM  Type of Therapy:  Group Therapy  Participation Level:  Came briefly.  Left.  Did not return.    Summary of Progress/Problems: The topic for group was balance in life.  Pt participated in the discussion about when their life was in balance and out of balance and how this feels.  Pt discussed ways to get back in balance and short term goals they can work on to get where they want to be.    Ida Rogue 09/06/2014 12:39 PM

## 2014-09-06 NOTE — Progress Notes (Signed)
Patient ID: Natalie [name], female   DOB: 1991-07-01, 23 y.o.   MRN: [id number]  D: Pt was in her room at the time of the assessment. Writer introduced herself to the pt and attempted to engage pt in conversation about the visit she had earlier in the shift with her husband. Pt kept her head down and would not make eye contact with the writer. Writer asked pt if she was satisfied with her care and if she had any questions about the conversations she had with her doctor. Pt wouldn't answer.  A:  Support and encouragement was offered. 15 min checks continued for safety.  R: Pt remains safe.

## 2014-09-07 MED ORDER — LAMOTRIGINE 25 MG PO TABS
50.0000 mg | ORAL_TABLET | Freq: Two times a day (BID) | ORAL | Status: DC
  Administered 2014-09-10 – 2014-09-11 (×2): 50 mg via ORAL
  Filled 2014-09-07 (×10): qty 2

## 2014-09-07 MED ORDER — ZIPRASIDONE HCL 20 MG PO CAPS
20.0000 mg | ORAL_CAPSULE | Freq: Two times a day (BID) | ORAL | Status: DC
  Filled 2014-09-07 (×4): qty 1

## 2014-09-07 MED ORDER — ZIPRASIDONE MESYLATE 20 MG IM SOLR
20.0000 mg | Freq: Two times a day (BID) | INTRAMUSCULAR | Status: DC
  Filled 2014-09-07 (×4): qty 20

## 2014-09-07 MED ORDER — ZIPRASIDONE HCL 60 MG PO CAPS
60.0000 mg | ORAL_CAPSULE | Freq: Two times a day (BID) | ORAL | Status: DC
  Administered 2014-09-08: 60 mg via ORAL
  Filled 2014-09-07 (×8): qty 1

## 2014-09-07 MED ORDER — ZIPRASIDONE MESYLATE 20 MG IM SOLR
20.0000 mg | Freq: Once | INTRAMUSCULAR | Status: AC
  Administered 2014-09-07: 20 mg via INTRAMUSCULAR
  Filled 2014-09-07: qty 20

## 2014-09-07 MED ORDER — ZIPRASIDONE HCL 60 MG PO CAPS
60.0000 mg | ORAL_CAPSULE | Freq: Once | ORAL | Status: AC
  Filled 2014-09-07: qty 1

## 2014-09-07 MED ORDER — BENZTROPINE MESYLATE 0.5 MG PO TABS
0.5000 mg | ORAL_TABLET | Freq: Two times a day (BID) | ORAL | Status: DC
  Administered 2014-09-08 – 2014-09-14 (×9): 0.5 mg via ORAL
  Filled 2014-09-07 (×3): qty 1
  Filled 2014-09-07: qty 28
  Filled 2014-09-07 (×14): qty 1
  Filled 2014-09-07: qty 28
  Filled 2014-09-07: qty 1

## 2014-09-07 MED ORDER — ZIPRASIDONE MESYLATE 20 MG IM SOLR
20.0000 mg | Freq: Two times a day (BID) | INTRAMUSCULAR | Status: DC
  Administered 2014-09-07 – 2014-09-09 (×3): 20 mg via INTRAMUSCULAR
  Filled 2014-09-07 (×8): qty 20

## 2014-09-07 MED ORDER — BENZTROPINE MESYLATE 1 MG/ML IJ SOLN
0.5000 mg | Freq: Two times a day (BID) | INTRAMUSCULAR | Status: DC
  Administered 2014-09-07 – 2014-09-10 (×6): 0.5 mg via INTRAMUSCULAR
  Filled 2014-09-07 (×10): qty 0.5
  Filled 2014-09-07: qty 2
  Filled 2014-09-07: qty 0.5
  Filled 2014-09-07: qty 2
  Filled 2014-09-07 (×2): qty 0.5
  Filled 2014-09-07: qty 2
  Filled 2014-09-07 (×3): qty 0.5
  Filled 2014-09-07: qty 2
  Filled 2014-09-07: qty 0.5

## 2014-09-07 NOTE — Tx Team (Signed)
  Interdisciplinary Treatment Plan Update   Date Reviewed:  09/07/2014  Time Reviewed:  7:55 AM  Progress in Treatment:   Attending groups: Yes Participating in groups: Yes Taking medication as prescribed: Yes  Tolerating medication: Yes Family/Significant other contact made: Yes  Patient understands diagnosis: Yes  Discussing patient identified problems/goals with staff: Yes  See initial care plan Medical problems stabilized or resolved: Yes Denies suicidal/homicidal ideation: Yes  In tx team Patient has not harmed self or others: Yes  For review of initial/current patient goals, please see plan of care.  Estimated Length of Stay:  4-5 days  Reason for Continuation of Hospitalization: Medication stabilization Other; describe Mood lability, refusing medication, non participation in therapeutic milieu  New Problems/Goals identified:  N/A  Discharge Plan or Barriers:   unknown whether she will return with husband or go home with parents  Additional Comments:  Natalie [name] is refusing anti-psychotic and not attending groups.  She is irritable and demonstrates mood lability.  She has made minimal progress since admission a week ago.  She met with the Dr, CSW and her husband yesterday, and spent the time yelling at and berating him.  Although one of the things she is angry about is injections, she will likely get more if she does not take medication PO as prescribed. Dr Sabra Heck signed second opinion today to force meds.  Attendees:  Signature: Steva Colder, MD 09/07/2014 7:55 AM   Signature: Ripley Fraise, LCSW 09/07/2014 7:55 AM  Signature:  09/07/2014 7:55 AM  Signature:  09/07/2014 7:55 AM  Signature:Lindsey Strader, RN  09/07/2014 7:55 AM  Signature:  09/07/2014 7:55 AM  Signature:   09/07/2014 7:55 AM  Signature:    Signature:    Signature:    Signature:    Signature:    Signature:      Scribe for Treatment Team:   [employer], LCSW  09/07/2014 7:55 AM

## 2014-09-07 NOTE — Progress Notes (Signed)
[facility] MD Progress Note  09/07/2014 12:10 PM Natalie [name]  MRN:  [id number] Subjective:  Patient is catatonic, staring spells, and is mute. Objective: Patient seen and chart reviewed. Patient in the hallway ,standing still ,does not move ,has staring spells is mute ,selectively talks in monosyllables periodically. Patient is very paranoid and suspicious. Patient has been refusing to take her medications since the past 2 days. Patient was educated and encouraged by the whole treatment team ,but she refuses to agree to take her medications.  Patient's family was called yesterday for family meeting. Per family patient is not at baseline and is still psychotic. This is how she acts when she is not doing well. Family spend a lot of time with patient as well as treatment team. Patient continues to be psychotic ,paranoid and has periods of irritability and mood lability ,not cooperating with treatment plan.  Hence will start the patient on Forced medication order after obtaining a second opinion.     Diagnosis:   DSM5: Primary Psychiatric Diagnosis: Bipolar disorder, type I ,most recent episode depressed,severe with psychotic features.   Secondary Psychiatric Diagnosis: OCD per history   Non Psychiatric Diagnosis: None      Total Time spent with patient: 30 minutes   Past Medical History  Diagnosis Date  . Psychiatric diagnosis   . Chronic bipolar disorder   . OCD (obsessive compulsive disorder)   . H/O suicide attempt 05-2013    OD  . Anxiety   . Depression     ADL's:  Intact  Sleep: Poor  Appetite:  Good   Psychiatric Specialty Exam: Physical Exam  ROS  Blood pressure 132/83, pulse 65, temperature 97.5 F (36.4 C), temperature source Oral, resp. rate 18, height  (1.651 m), weight 68.04 kg (150 lb).Body mass index is 24.96 kg/(m^2).  General Appearance: Casual  Eye Contact::  staring spells  Speech:  non communicative   Volume:  Normal  Mood:  Angry and  Irritable  Affect:  Blunt  Thought Process:  Irrelevant  Orientation:  Other:  unable to assess  Thought Content:  Delusions and Paranoid Ideation   Suicidal Thoughts:  Unable to assess, patient is mute   Homicidal Thoughts: Unable to assess, patient is mute  Memory:  Immediate;   unable to assess Recent;   unable to assess Remote;   unable to assess  Judgement:  Poor  Insight:  Lacking  Psychomotor Activity:  Decreased and maintains the same posture for hours  Concentration:  Poor  Recall:  NA  Fund of Knowledge:Fair  Language: Good  Akathisia:  NA  Handed:  Right  AIMS (if indicated):     Assets:  Social Support  Sleep:  Number of Hours: 4   Musculoskeletal: Strength & Muscle Tone: within normal limits Gait & Station: normal Patient leans: N/A  Current Medications: Current Facility-Administered Medications  Medication Dose Route Frequency Provider Last Rate Last Dose  . acetaminophen (TYLENOL) tablet 650 mg  650 mg Oral Q6H PRN [name], NP      . alum & mag hydroxide-simeth (MAALOX/MYLANTA) 200-200-20 MG/5ML suspension 30 mL  30 mL Oral Q4H PRN [name], NP      . alum & mag hydroxide-simeth (MAALOX/MYLANTA) 200-200-20 MG/5ML suspension 30 mL  30 mL Oral Q4H PRN [name], NP      . benztropine (COGENTIN) tablet 0.5 mg  0.5 mg Oral BID [name], MD       Or  . benztropine mesylate (COGENTIN) injection  0.5 mg  0.5 mg Intramuscular BID [name], MD      . benztropine (COGENTIN) tablet 1 mg  1 mg Oral Q breakfast Kiaja Shorty, MD      . carbamazepine (TEGRETOL XR) 12 hr tablet 300 mg  300 mg Oral BID [name], MD   300 mg at 09/05/14 1731  . feeding supplement (ENSURE COMPLETE) (ENSURE COMPLETE) liquid 237 mL  237 mL Oral BID BM Elyse A Shearer, RD      . hydrOXYzine (ATARAX/VISTARIL) tablet 25 mg  25 mg Oral Q6H PRN [name], MD      . ibuprofen (ADVIL,MOTRIN) tablet 600 mg  600 mg Oral Q8H PRN [name], NP      . lamoTRIgine  (LAMICTAL) tablet 50 mg  50 mg Oral BID Any Mcneice, MD      . magnesium hydroxide (MILK OF MAGNESIA) suspension 30 mL  30 mL Oral Daily PRN [name], NP   30 mL at 09/02/14 2212  . magnesium hydroxide (MILK OF MAGNESIA) suspension 30 mL  30 mL Oral Daily PRN Evanna Cori Burkett, NP      . OLANZapine zydis (ZYPREXA) disintegrating tablet 5 mg  5 mg Oral Q6H PRN [name], MD      . traZODone (DESYREL) tablet 150 mg  150 mg Oral QHS [name], MD   150 mg at 09/06/14 2154  . ziprasidone (GEODON) capsule 60 mg  60 mg Oral BID WC Deionte Spivack, MD       Or  . ziprasidone (GEODON) injection 20 mg  20 mg Intramuscular BID WC Ousman Dise, MD      . ziprasidone (GEODON) capsule 60 mg  60 mg Oral Once [name], MD       Or  . ziprasidone (GEODON) injection 20 mg  20 mg Intramuscular Once [name], MD        Lab Results:  No results found for this or any previous visit (from the past 48 hour(s)).  Physical Findings: AIMS: Facial and Oral Movements Muscles of Facial Expression: None, normal Lips and Perioral Area: None, normal Jaw: None, normal Tongue: None, normal,Extremity Movements Upper (arms, wrists, hands, fingers): None, normal Lower (legs, knees, ankles, toes): None, normal, Trunk Movements Neck, shoulders, hips: None, normal, Overall Severity Severity of abnormal movements (highest score from questions above): None, normal Incapacitation due to abnormal movements: None, normal Patient's awareness of abnormal movements (rate only patient's report): No Awareness, Dental Status Current problems with teeth and/or dentures?: No Does patient usually wear dentures?: No  CIWA:   COWS:   Treatment Plan Summary: Daily contact with patient to assess and evaluate symptoms and progress in treatment Medication management  Plan:  Continue with plan of care Continue crisis management Encourage to participate in group and individual sessions Continue medication  management/ and review as needed Medication management to treat current mood problems  Continue Tegretol XR   300 MG PO bid for mood stabilization . Will continue  lamictal along with her tegretol for better symptom control. Will discontinue Latuda. Will start Geodon 60 mg po bid or Geodon 20 mg IM if she refuses PO. Per family patient has had better response to Geodon in the past and would prefer patient to be on it. Forced medication order in chart. Will continue trazodone 150 mg po qhs for sleep. Will continue Vistaril 25 mg po q6h prn. Group counseling sessions and activities.  Discharge  Plan in progress Address health issues /V/S as needed  Medical Decision Making Problem Points:  Established problem, worsening (2) Data Points:  Order Aims Assessment (2) Review and summation of old records (2) Review of medication regiment & side effects (2) Review of new medications or change in dosage (2)  I certify that inpatient services furnished can reasonably be expected to improve the patient's condition.   Elliana Bal  MD 09/07/2014, 12:10 PM

## 2014-09-07 NOTE — Progress Notes (Signed)
D. Pt in the hall much of the evening, staring at staff, refusing to speak. Pt inquired as to why she was still here and spoke about wanting to go home. Pt also spoke about how she did not need medications and refused evening doses of po medications and did receive IM Geodon and cogentin without issue. Pt unable to participate in evening group session, and when she did choose to speak talked about how she is not gay and that she knows she's not gay. A. Support and encouragement provided, medication education given. R. Pt continued to just stare at staff, pt remains safe at this time.

## 2014-09-07 NOTE — Progress Notes (Signed)
D. Pt has been up and visible in milieu this evening, pt seen pacing halls and spoke about not being able to go into her room because she in "not gay" and she can can have a woman as a roommate. Pt did receive her medications this evening without incident but appears unable to tolerate a roommate at this time and is currently awake and pacing halls and not wanting to go into her room as she keeps saying that she is not gay. A. Support and encouragement provided. R. Safety maintained, will continue to monitor.

## 2014-09-07 NOTE — [facility] Group Notes (Signed)
[facility] LCSW Aftercare Discharge Planning Group Note   09/07/2014 10:31 AM  Participation Quality:  Invited.  Refused to attend    [place], Thereasa Distance B

## 2014-09-07 NOTE — Progress Notes (Addendum)
Patient ID: Natalie [name], female   DOB: Sep 30, 1991, 23 y.o.   MRN: [id number] D. Patient presents with irritable mood, affect congruent. [name] remains resistant to care, refusing to speak at times with writer and staff. She refused all am medications this morning. Writer later approached patient, and she states '' I'm not a dumb person, I know that people are going around laughing at me. I'm tired of all these people talking to me. I'm tired of being locked up. I'm tired of that sheet of paper you want me to fill out. '' Patient was offered po medications x 3, and continued to refuse. Orders received for forced medications. Pt did accept forced injections without issue, but continues to stand in hallway staring at wall. A. Medications given as ordered. Support, education and reassurance provided. Pt continues to refuse EKG. R. Patient is safe on the unit. No further voiced concerns at this time. Will continue to monitor q 15 minutes for safety.

## 2014-09-07 NOTE — Progress Notes (Signed)
[facility] Group Notes:  (Nursing/MHT/Case Management/Adjunct)  Date:  09/07/2014  Time:  11:09 PM  Type of Therapy:  Group Therapy  Participation Level:  None  Participation Quality:  Inattentive  Affect:  Flat  Cognitive:  Lacking  Insight:  None  Engagement in Group:  None  Modes of Intervention:  Socialization and Support  Summary of Progress/Problems: Pt. Walked out of group shortly before discussion began.  Pt. Was sitting in room quietly.  Sondra Come 09/07/2014, 11:09 PM

## 2014-09-08 MED ORDER — TRAZODONE HCL 100 MG PO TABS
200.0000 mg | ORAL_TABLET | Freq: Every day | ORAL | Status: DC
  Administered 2014-09-08 – 2014-09-13 (×5): 200 mg via ORAL
  Filled 2014-09-08 (×5): qty 2
  Filled 2014-09-08: qty 28
  Filled 2014-09-08 (×2): qty 2

## 2014-09-08 NOTE — Progress Notes (Signed)
Patient ID: Natalie [name], female   DOB: [birth year], 23 y.o.   MRN: [id number] Psychoeducational Group Note  Date:  09/08/2014 Time:0930am  Group Topic/Focus:  Identifying Needs:   The focus of this group is to help patients identify their personal needs that have been historically problematic and identify healthy behaviors to address their needs.  Participation Level:  Minimal  Participation Quality:  Inattentive and Resistant  Affect:  Irritable  Cognitive:  Lacking  Insight:  None  Engagement in Group:  None  Additional Comments:  Inventory group   Valente David 09/08/2014,10:03 AM

## 2014-09-08 NOTE — Progress Notes (Signed)
Patient ID: Natalie [name], female   DOB: 07-26-1991, 23 y.o.   MRN: [id number] Psychoeducational Group Note  Date:  09/08/2014 Time:1000am  Group Topic/Focus:  Identifying Needs:   The focus of this group is to help patients identify their personal needs that have been historically problematic and identify healthy behaviors to address their needs.  Participation Level:  None  Participation Quality:  Inattentive and Resistant  Affect:  Irritable  Cognitive:  Lacking  Insight:  None  Engagement in Group:  None  Additional Comments:  Healthy coping skills.   Valente David 09/08/2014,10:04 AM

## 2014-09-08 NOTE — Progress Notes (Signed)
[facility] MD Progress Note  09/08/2014 11:07 AM Natalie [name]  MRN:  [id number] Subjective:  Patient states,' my mind is all over the place'. Objective: Patient seen and chart reviewed. Patient in her room ,irritable ,angry with minimal eye contact. Patient reports feeling anxious and having racing thoughts. Patient has minimal eye contact and is suspicious and guarded ,continues to refuse po medications. Patient however is selectively responding to questions being asked today.   Patient went to groups today and reported feeling "irritable". Per staff patient stood in the hallway all night last night and had minimal sleep.  Per family meeting  patient is not at baseline and is still psychotic. Patient is on Forced medication order after obtaining a second opinion.     Diagnosis:   DSM5: Primary Psychiatric Diagnosis: Bipolar disorder, type I ,most recent episode depressed,severe with psychotic features.   Secondary Psychiatric Diagnosis: OCD per history   Non Psychiatric Diagnosis: None      Total Time spent with patient: 30 minutes   Past Medical History  Diagnosis Date  . Psychiatric diagnosis   . Chronic bipolar disorder   . OCD (obsessive compulsive disorder)   . H/O suicide attempt 05-2013    OD  . Anxiety   . Depression     ADL's:  Intact  Sleep: Poor  Appetite:  Good   Psychiatric Specialty Exam: Physical Exam  ROS  Blood pressure 135/78, pulse 70, temperature 98.4 F (36.9 C), temperature source Oral, resp. rate 16, height 5\' 5"  (1.651 m), weight 68.04 kg (150 lb).Body mass index is 24.96 kg/(m^2).  General Appearance: Casual  Eye Contact::  staring spells  Speech:  minimal    Volume:  Increased  Mood:  Angry and Irritable  Affect:  Blunt  Thought Process:  Irrelevant  Orientation:  Other:  unable to assess  Thought Content:  Delusions and Paranoid Ideation   Suicidal Thoughts: denies  Homicidal Thoughts: denies  Memory:  Immediate;   unable  to assess Recent;   unable to assess Remote;   unable to assess  Judgement:  Poor  Insight:  Lacking  Psychomotor Activity:  Decreased and maintains the same posture for hours -last night stood in the hallways for hours  Concentration:  Poor  Recall:  NA  Fund of Knowledge:Fair  Language: Good  Akathisia:  NA  Handed:  Right  AIMS (if indicated):     Assets:  Social Support  Sleep:  Number of Hours: 3   Musculoskeletal: Strength & Muscle Tone: within normal limits Gait & Station: normal Patient leans: N/A  Current Medications: Current Facility-Administered Medications  Medication Dose Route Frequency Provider Last Rate Last Dose  . acetaminophen (TYLENOL) tablet 650 mg  650 mg Oral Q6H PRN [id number], NP      . alum & mag hydroxide-simeth (MAALOX/MYLANTA) 200-200-20 MG/5ML suspension 30 mL  30 mL Oral Q4H PRN 06-08-2001, NP      . alum & mag hydroxide-simeth (MAALOX/MYLANTA) 200-200-20 MG/5ML suspension 30 mL  30 mL Oral Q4H PRN Evanna 06-08-2001, NP      . benztropine (COGENTIN) tablet 0.5 mg  0.5 mg Oral BID Janann August, MD       Or  . benztropine mesylate (COGENTIN) injection 0.5 mg  0.5 mg Intramuscular BID [name], MD   0.5 mg at 09/08/14 0849  . benztropine (COGENTIN) tablet 1 mg  1 mg Oral Q breakfast Jaiona Simien, MD      . carbamazepine (TEGRETOL XR) 12 hr  tablet 300 mg  300 mg Oral BID [name], MD   300 mg at 09/05/14 1731  . feeding supplement (ENSURE COMPLETE) (ENSURE COMPLETE) liquid 237 mL  237 mL Oral BID BM Elyse A Shearer, RD      . hydrOXYzine (ATARAX/VISTARIL) tablet 25 mg  25 mg Oral Q6H PRN [name], MD      . ibuprofen (ADVIL,MOTRIN) tablet 600 mg  600 mg Oral Q8H PRN [name], NP      . lamoTRIgine (LAMICTAL) tablet 50 mg  50 mg Oral BID Chrisopher Pustejovsky, MD      . magnesium hydroxide (MILK OF MAGNESIA) suspension 30 mL  30 mL Oral Daily PRN [name], NP   30 mL at 09/02/14 2212  . magnesium hydroxide (MILK OF MAGNESIA)  suspension 30 mL  30 mL Oral Daily PRN Evanna Cori Burkett, NP      . OLANZapine zydis (ZYPREXA) disintegrating tablet 5 mg  5 mg Oral Q6H PRN [name], MD      . traZODone (DESYREL) tablet 150 mg  150 mg Oral QHS [name], MD   150 mg at 09/06/14 2154  . ziprasidone (GEODON) capsule 60 mg  60 mg Oral BID WC Anya Murphey, MD       Or  . ziprasidone (GEODON) injection 20 mg  20 mg Intramuscular BID WC Cleotilde Spadaccini, MD   20 mg at 09/08/14 0848    Lab Results:  No results found for this or any previous visit (from the past 48 hour(s)).  Physical Findings: AIMS: Facial and Oral Movements Muscles of Facial Expression: None, normal Lips and Perioral Area: None, normal Jaw: None, normal Tongue: None, normal,Extremity Movements Upper (arms, wrists, hands, fingers): None, normal Lower (legs, knees, ankles, toes): None, normal, Trunk Movements Neck, shoulders, hips: None, normal, Overall Severity Severity of abnormal movements (highest score from questions above): None, normal Incapacitation due to abnormal movements: None, normal Patient's awareness of abnormal movements (rate only patient's report): No Awareness, Dental Status Current problems with teeth and/or dentures?: No Does patient usually wear dentures?: No  CIWA:   COWS:   Treatment Plan Summary: Daily contact with patient to assess and evaluate symptoms and progress in treatment Medication management  Plan: Patient has been refusing her medications and hence started decompensating. Patient's family was asked to come in for a treatment team meeting and per family patient is not at baseline and this is how she acts when she is psychotic. Patient became catatonic and mute and would stand maintaining the same posture for hours. Patient has minimal sleep. Hence patient was started on forced medications (09/07/14).  Encourage to participate in group and individual sessions Continue medication management/ and review as  needed Medication management to treat current mood problems  Continue Tegretol XR   300 MG PO bid for mood stabilization . Will continue  lamictal along with her tegretol for better symptom control. Will continue Geodon 60 mg po bid or Geodon 20 mg IM if she refuses PO. Per family patient has had better response to Geodon in the past and would prefer patient to be on it. Forced medication order in chart. Placed order for EKG- Patient refused.will order again. Will increase trazodone to 200 mg po qhs for sleep. Will continue Vistaril 25 mg po q6h prn. Group counseling sessions and activities.  Discharge  Plan in progress Address health issues /V/S as needed   Medical Decision Making Problem Points:  Established problem, worsening (2) Data Points:  Order  Aims Assessment (2) Review and summation of old records (2) Review of medication regiment & side effects (2)  I certify that inpatient services furnished can reasonably be expected to improve the patient's condition.   Natalie Mally  MD 09/08/2014, 11:07 AM

## 2014-09-08 NOTE — Progress Notes (Signed)
The focus of this group is to help patients review their daily goal of treatment and discuss progress on daily workbooks. Pt was invited to attend the evening group session, but did not respond and instead stood in the doorway of the dayroom entrance for the duration of group.

## 2014-09-08 NOTE — [facility] Group Notes (Signed)
[facility] Group Notes:  (Clinical Social Work)  09/08/2014  11:15-12:00PM  Summary of Progress/Problems:   The main focus of today's process group was to discuss patients' feelings about hospitalization, the stigma attached to mental health, and sources of motivation to stay well.  We then worked to identify a specific plan to avoid future hospitalizations when discharged from the hospital for this admission.  The patient expressed resistance to coming to group and left almost immediately after saying she was in the hospital for a medication adjustment.  Type of Therapy:  Group Therapy - Process  Participation Level:  None  Participation Quality:  Resistant  Affect:  Depressed and Flat  Cognitive:  Confused  Insight:  Poor  Engagement in Therapy:  Poor  Modes of Intervention:  Exploration, Discussion  Ambrose Mantle, LCSW 09/08/2014, 12:47 PM

## 2014-09-08 NOTE — Progress Notes (Signed)
Patient ID: Natalie [name], female   DOB: 09-May-1991, 23 y.o.   MRN: [id number] 09-08-14 nursing shift note: D: MD put in an order for a EKG and the pt refused. This pt has been selectively mute today, and has been difficult to redirect. A: she has refused her regular medications and had to be given her geodon 20 mg injection and her cogentin 0.5 mg injection. This were her back up medications. She was resistant to take the injections, but eventually took them. R: on her inventory sheet she wrote: slept poor, but got no sleep medications, appetite poor, energy low, concentration good with her depression, anxiety and hopelessness all at "10". No withdrawal, pain or physical problems. Her goal to day is "to stay to myself". RN will monitor and Q 15 min ck's continue.

## 2014-09-09 MED ORDER — ZIPRASIDONE MESYLATE 20 MG IM SOLR
20.0000 mg | Freq: Two times a day (BID) | INTRAMUSCULAR | Status: DC
  Administered 2014-09-09 – 2014-09-10 (×2): 20 mg via INTRAMUSCULAR
  Filled 2014-09-09 (×10): qty 20

## 2014-09-09 MED ORDER — ZIPRASIDONE MESYLATE 20 MG IM SOLR: 40.0000 mg | Freq: Two times a day (BID) | INTRAMUSCULAR | Status: DC

## 2014-09-09 MED ORDER — DIPHENHYDRAMINE HCL 50 MG/ML IJ SOLN
50.0000 mg | Freq: Every day | INTRAMUSCULAR | Status: DC
  Filled 2014-09-09 (×7): qty 1

## 2014-09-09 MED ORDER — ZIPRASIDONE HCL 80 MG PO CAPS: 80.0000 mg | ORAL_CAPSULE | Freq: Two times a day (BID) | ORAL | Status: DC

## 2014-09-09 MED ORDER — ZIPRASIDONE HCL 80 MG PO CAPS
80.0000 mg | ORAL_CAPSULE | Freq: Two times a day (BID) | ORAL | Status: DC
  Administered 2014-09-10 – 2014-09-12 (×4): 80 mg via ORAL
  Filled 2014-09-09 (×10): qty 1

## 2014-09-09 NOTE — Progress Notes (Signed)
Patient ID: Natalie [name], female   DOB: 1991-08-31, 23 y.o.   MRN: [id number] D. Patient presents with irritable mood, affect congruent again today. [name] remains resistant to care, refusing to speak at times with writer and staff. She continues to refuse po medications, stating '' you are giving me the wrong medications. I am in the wrong room, I do not want these medications. I am tired of being treated like I'm in prison.''   Pt did accept forced injections without issue, but continues to stand in hallway staring at wall at times. A. Medications given as ordered. Support, education and reassurance provided. Discussed above information with Dr. Elna Breslow. Pt continues to refuse EKG. R. Patient is safe on the unit. No further voiced concerns at this time. Will continue to monitor q 15 minutes for safety.

## 2014-09-09 NOTE — Progress Notes (Signed)
[facility] MD Progress Note  09/09/2014 12:03 PM Natalie [name]  MRN:  [id number] Subjective:  Patient states,'I am not feeling good' Objective: Patient seen and chart reviewed. Patient in her room ,continues to be not cooperative with treatment milieu. Patient avoids eye contact and is irritable. Patient per report is selectively responding to other staff and peers.  Per staff patient stood in the hallway all night last night and had minimal sleep.   Per family meeting  patient is not at baseline and is still psychotic. Patient is on Forced medication order after obtaining a second opinion.     Diagnosis:   DSM5: Primary Psychiatric Diagnosis: Bipolar disorder, type I ,most recent episode depressed,severe with psychotic features.   Secondary Psychiatric Diagnosis: OCD per history   Non Psychiatric Diagnosis: None      Total Time spent with patient: 30 minutes   Past Medical History  Diagnosis Date  . Psychiatric diagnosis   . Chronic bipolar disorder   . OCD (obsessive compulsive disorder)   . H/O suicide attempt 05-2013    OD  . Anxiety   . Depression     ADL's:  Intact  Sleep: Poor  Appetite:  Good   Psychiatric Specialty Exam: Physical Exam  ROS  Blood pressure 135/78, pulse 70, temperature 98.4 F (36.9 C), temperature source Oral, resp. rate 16, height 5\' 5"  (1.651 m), weight 68.04 kg (150 lb).Body mass index is 24.96 kg/(m^2).  General Appearance: Casual  Eye Contact::  Poor  Speech:  minimal    Volume:  Increased  Mood:  Angry and Irritable  Affect:  Blunt  Thought Process:  Irrelevant  Orientation:  Other:  unable to assess  Thought Content:  Delusions and Paranoid Ideation   Suicidal Thoughts: denies  Homicidal Thoughts: denies  Memory:  Immediate;   unable to assess Recent;   unable to assess Remote;   unable to assess  Judgement:  Poor  Insight:  Lacking  Psychomotor Activity:  Decreased and maintains the same posture for hours -last  night stood in the hallways for hours  Concentration:  Poor  Recall:  NA  Fund of Knowledge:Fair  Language: Good  Akathisia:  NA  Handed:  Right  AIMS (if indicated):     Assets:  Social Support  Sleep:  Number of Hours: 1.5   Musculoskeletal: Strength & Muscle Tone: within normal limits Gait & Station: normal Patient leans: N/A  Current Medications: Current Facility-Administered Medications  Medication Dose Route Frequency Provider Last Rate Last Dose  . acetaminophen (TYLENOL) tablet 650 mg  650 mg Oral Q6H PRN [id number], NP      . alum & mag hydroxide-simeth (MAALOX/MYLANTA) 200-200-20 MG/5ML suspension 30 mL  30 mL Oral Q4H PRN 06-08-2001, NP      . alum & mag hydroxide-simeth (MAALOX/MYLANTA) 200-200-20 MG/5ML suspension 30 mL  30 mL Oral Q4H PRN Evanna 06-08-2001, NP      . benztropine (COGENTIN) tablet 0.5 mg  0.5 mg Oral BID Janann August, MD   0.5 mg at 09/08/14 1631   Or  . benztropine mesylate (COGENTIN) injection 0.5 mg  0.5 mg Intramuscular BID 11/08/14, MD   0.5 mg at 09/09/14 0845  . benztropine (COGENTIN) tablet 1 mg  1 mg Oral Q breakfast Mekel Haverstock, MD      . carbamazepine (TEGRETOL XR) 12 hr tablet 300 mg  300 mg Oral BID 11/09/14, MD   300 mg at 09/05/14 1731  . feeding supplement (ENSURE  COMPLETE) (ENSURE COMPLETE) liquid 237 mL  237 mL Oral BID BM Elyse A Shearer, RD      . hydrOXYzine (ATARAX/VISTARIL) tablet 25 mg  25 mg Oral Q6H PRN [name], MD      . ibuprofen (ADVIL,MOTRIN) tablet 600 mg  600 mg Oral Q8H PRN [name], NP      . lamoTRIgine (LAMICTAL) tablet 50 mg  50 mg Oral BID Brae Schaafsma, MD      . magnesium hydroxide (MILK OF MAGNESIA) suspension 30 mL  30 mL Oral Daily PRN [name], NP   30 mL at 09/02/14 2212  . magnesium hydroxide (MILK OF MAGNESIA) suspension 30 mL  30 mL Oral Daily PRN [name], NP      . OLANZapine zydis (ZYPREXA) disintegrating tablet 5 mg  5 mg Oral Q6H PRN [name], MD       . traZODone (DESYREL) tablet 200 mg  200 mg Oral QHS [name], MD   200 mg at 09/08/14 2154  . ziprasidone (GEODON) capsule 60 mg  60 mg Oral BID WC Murry Diaz, MD   60 mg at 09/08/14 1631   Or  . ziprasidone (GEODON) injection 20 mg  20 mg Intramuscular BID WC Deng Kemler, MD   20 mg at 09/09/14 0844    Lab Results:  No results found for this or any previous visit (from the past 48 hour(s)).  Physical Findings: AIMS: Facial and Oral Movements Muscles of Facial Expression: None, normal Lips and Perioral Area: None, normal Jaw: None, normal Tongue: None, normal,Extremity Movements Upper (arms, wrists, hands, fingers): None, normal Lower (legs, knees, ankles, toes): None, normal, Trunk Movements Neck, shoulders, hips: None, normal, Overall Severity Severity of abnormal movements (highest score from questions above): None, normal Incapacitation due to abnormal movements: None, normal Patient's awareness of abnormal movements (rate only patient's report): No Awareness, Dental Status Current problems with teeth and/or dentures?: No Does patient usually wear dentures?: No  CIWA:   COWS:   Treatment Plan Summary: Daily contact with patient to assess and evaluate symptoms and progress in treatment Medication management  Plan: Patient has been refusing her medications and hence started decompensating. Patient's family was asked to come in for a treatment team meeting and per family patient is not at baseline and this is how she acts when she is psychotic. Patient became catatonic and mute and would stand maintaining the same posture for hours. Patient has minimal sleep. Hence patient was started on forced medications (09/07/14).  Encourage to participate in group and individual sessions Continue medication management/ and review as needed Medication management to treat current mood problems  Continue Tegretol XR   300 MG PO bid for mood stabilization . Will continue   lamictal along with her tegretol for better symptom control. Will increase Geodon to 80 mg po bid or Geodon 20 mg IM if she refuses PO. Per family patient has had better response to Geodon in the past and would prefer patient to be on it. Forced medication order in chart. Placed order for EKG- Patient refused.will order again. Will continue trazodone 200 mg po qhs for sleep. However if patient refuses to take trazodone tonight ,then will give her Benadryl 50 mg IM at bedtime for sleep. Will continue Vistaril 25 mg po q6h prn. Group counseling sessions and activities.  Discharge  Plan in progress Address health issues /V/S as needed   Medical Decision Making Problem Points:  Established problem, worsening (2) Data Points:  Order  Aims Assessment (2) Review and summation of old records (2) Review of medication regiment & side effects (2)  I certify that inpatient services furnished can reasonably be expected to improve the patient's condition.   Omni Dunsworth  MD 09/09/2014, 12:03 PM

## 2014-09-09 NOTE — [facility] Group Notes (Signed)
[facility] Group Notes: (Clinical Social Work)   09/09/2014      Type of Therapy:  Group Therapy   Participation Level:  Did Not Attend - refused   Ambrose Mantle, LCSW 09/09/2014, 1:15 PM

## 2014-09-09 NOTE — [facility] Group Notes (Signed)
[facility] Group Notes:  (Nursing/MHT/Case Management/Adjunct)  Date:  09/09/2014  Time:  0930   Type of Therapy:  Nurse Education  Participation Level:  Did Not Attend  Participation Quality:    Affect:     Cognitive:    Insight:    Engagement in Group:    Modes of Intervention:    Summary of Progress/Problems:  Layla Barter 09/09/2014, 1:46 PM

## 2014-09-09 NOTE — Progress Notes (Signed)
Natalie [name]. Pt has been up and has been visible in milieu, unable to participate in evening wrap-up group. When pt did speak, she spoke about her family and how they are not nice to her and how she tries to be nice to them and does not like to be talked down to by her husband. Pt also spoke about how she feels she does not need any medications and does not like the fact that she has been receiving injections. Pt, as of this time, has been standing in room in doorway and spoke about how she can't sleep until she finds the perfect room.  A. Support and encouragement provided, dayroom offered as an alternative to help with rest. R. Safety maintained at this time, will continue to monitor.

## 2014-09-09 NOTE — Progress Notes (Signed)
Pt has standing in room for most of the early morning hours, refusing offers from staff to go to dayroom as an alternative and not able to accept staff's assurances that it was safe to sleep in her room. Pt was seen sitting in chair at times and slept for possibly one hour.

## 2014-09-10 NOTE — [facility] Group Notes (Signed)
[facility] LCSW Aftercare Discharge Planning Group Note   09/10/2014 10:26 AM  Participation Quality:  Invited  Did not attend  Standing in hall looking at me with angry eyes.    Natalie [name] B

## 2014-09-10 NOTE — Progress Notes (Signed)
[facility] MD Progress Note  09/10/2014 6:04 PM IMAJEAN [name]  MRN:  [id number] Subjective:  Natalie [name] is still not verbally interactive. She does not answers questions. She made a comment about wanting to be in another hall. She was later seen in the phone. Not clear who was she trying to communicate to. She continues to not comply with the po meds Diagnosis:   DSM5: Depressive Disorders:  Major Depressive Disorder - Severe (296.23) Total Time spent with patient: 30 minutes  Axis I: Bipolar, Depressed  ADL's:  Impaired  Sleep: Fair  Appetite:  Fair Psychiatric Specialty Exam: Physical Exam  Review of Systems  Constitutional: Negative.   HENT: Negative.   Eyes: Negative.   Respiratory: Negative.   Cardiovascular: Negative.   Gastrointestinal: Negative.   Genitourinary: Negative.   Musculoskeletal: Negative.   Skin: Negative.   Neurological: Negative.   Endo/Heme/Allergies: Negative.   Psychiatric/Behavioral: Positive for depression. The patient is nervous/anxious.     Blood pressure 135/78, pulse 70, temperature 98.4 F (36.9 C), temperature source Oral, resp. rate 16, height 5\' 5"  (1.651 m), weight 68.04 kg (150 lb).Body mass index is 24.96 kg/(m^2).  General Appearance: Fairly Groomed  ::  Minimal  Speech:  not spontaneous content will not answer questions  Volume:  Decreased  Mood:  Depressed  Affect:  Restricted  Thought Process:  not thought production  Orientation:  Other:  unable to assess  Thought Content:  not spontaneous content  Suicidal Thoughts:  Unable to assess  Homicidal Thoughts:  Unable to assess  Memory:  NA  Judgement:  Impaired  Insight:  Lacking  Psychomotor Activity:  resless on occasion  Concentration:  Poor  Recall:  Negative  Fund of Knowledge:NA  Language: Negative  Akathisia:  No  Handed:    AIMS (if indicated):     Assets:  Others:  unable to evaluate  Sleep:  Number of Hours: 3.5   Musculoskeletal: Strength & Muscle  Tone: within normal limits Gait & Station: normal Patient leans: N/A  Current Medications: Current Facility-Administered Medications  Medication Dose Route Frequency Provider Last Rate Last Dose  . acetaminophen (TYLENOL) tablet 650 mg  650 mg Oral Q6H PRN [id number], NP      . alum & mag hydroxide-simeth (MAALOX/MYLANTA) 200-200-20 MG/5ML suspension 30 mL  30 mL Oral Q4H PRN 06-08-2001, NP      . alum & mag hydroxide-simeth (MAALOX/MYLANTA) 200-200-20 MG/5ML suspension 30 mL  30 mL Oral Q4H PRN Evanna 06-08-2001, NP      . benztropine (COGENTIN) tablet 0.5 mg  0.5 mg Oral BID Janann August, MD   0.5 mg at 09/10/14 1745   Or  . benztropine mesylate (COGENTIN) injection 0.5 mg  0.5 mg Intramuscular BID 11/10/14, MD   0.5 mg at 09/10/14 0800  . carbamazepine (TEGRETOL XR) 12 hr tablet 300 mg  300 mg Oral BID 11/10/14, MD   300 mg at 09/10/14 1745  . diphenhydrAMINE (BENADRYL) injection 50 mg  50 mg Intramuscular QHS Saramma Eappen, MD      . feeding supplement (ENSURE COMPLETE) (ENSURE COMPLETE) liquid 237 mL  237 mL Oral BID BM Elyse A Shearer, RD      . hydrOXYzine (ATARAX/VISTARIL) tablet 25 mg  25 mg Oral Q6H PRN Saramma Eappen, MD      . ibuprofen (ADVIL,MOTRIN) tablet 600 mg  600 mg Oral Q8H PRN 11/10/14, NP      . lamoTRIgine (LAMICTAL) tablet 50 mg  50  mg Oral BID [name], MD   50 mg at 09/10/14 1742  . magnesium hydroxide (MILK OF MAGNESIA) suspension 30 mL  30 mL Oral Daily PRN [name], NP   30 mL at 09/02/14 2212  . magnesium hydroxide (MILK OF MAGNESIA) suspension 30 mL  30 mL Oral Daily PRN [name], NP      . OLANZapine zydis (ZYPREXA) disintegrating tablet 5 mg  5 mg Oral Q6H PRN [name], MD      . traZODone (DESYREL) tablet 200 mg  200 mg Oral QHS [name], MD   200 mg at 09/08/14 2154  . ziprasidone (GEODON) injection 20 mg  20 mg Intramuscular BID WC Saramma Eappen, MD   20 mg at 09/10/14 0800   Or  . ziprasidone  (GEODON) capsule 80 mg  80 mg Oral BID WC Saramma Eappen, MD   80 mg at 09/10/14 1745    Lab Results: No results found for this or any previous visit (from the past 48 hour(s)).  Physical Findings: AIMS: Facial and Oral Movements Muscles of Facial Expression: None, normal Lips and Perioral Area: None, normal Jaw: None, normal Tongue: None, normal,Extremity Movements Upper (arms, wrists, hands, fingers): None, normal Lower (legs, knees, ankles, toes): None, normal, Trunk Movements Neck, shoulders, hips: None, normal, Overall Severity Severity of abnormal movements (highest score from questions above): None, normal Incapacitation due to abnormal movements: None, normal Patient's awareness of abnormal movements (rate only patient's report): No Awareness, Dental Status Current problems with teeth and/or dentures?: No Does patient usually wear dentures?: No  CIWA:  CIWA-Ar Total: 10 COWS:  COWS Total Score: 4  Treatment Plan Summary: Daily contact with patient to assess and evaluate symptoms and progress in treatment Medication management  Plan: Supportive approach           Will pursue actual medication regime           She seems to be more physically active and more interactive  than wehnlast time seen by this MD before meds were administered  Medical Decision Making Problem Points:  Review of psycho-social stressors (1) Data Points:  Review of medication regiment & side effects (2)  I certify that inpatient services furnished can reasonably be expected to improve the patient's condition.   [name] A 09/10/2014, 6:04 PM

## 2014-09-10 NOTE — Plan of Care (Signed)
Problem: Ineffective individual coping Goal: STG: Pt will be able to identify effective and ineffective STG: Pt will be able to identify effective and ineffective coping patterns  Outcome: Not Progressing Patient does not show insight into her need for meds. Will not discuss effective coping skills with this Clinical research associate.  Problem: Alteration in thought process Goal: STG-Patient is able to discuss thoughts with staff Outcome: Not Progressing Patient guarded, suspicious and electively mute at times.

## 2014-09-10 NOTE — Progress Notes (Signed)
Spent lengthy amount of time with patient 1:1 explaining medications. Patient continues to state, "these are not the right meds. These are not what I was on before." When asked to list the medications patient was taking previously, patient is unable to name any. Pt frustrated and distrusting of MD and staff. Offered support and reassurance. Showed patient the packages and poured meds in front of her. After much prompting patient did take her po meds at 1800. Patient angry, stating, "I don't trust you but I'm sick of the shots." Pt safe at this time. Lawrence Marseilles

## 2014-09-10 NOTE — Progress Notes (Signed)
   D:  Writer introduced herself to the pt. Pt would alternate between staring and avoiding eye contact. When asked about her day pt didn't respond. However, when asked if she had any questions and concerns pt stated, "I need to know if I'm involuntary or voluntarily committed. Pt also denied SI, HI, A/V.  A:  Support and encouragement was offered. 15 min checks continued for safety.  R: Pt remains safe. Marland Kitchen

## 2014-09-10 NOTE — Progress Notes (Signed)
Patient's status remains unchanged. She continues to stare blankly at staff and is electively mute at times. Patient angry that she is receiving forced medications however continues to refuse po meds. Patient's affect hostile, angry with congruent mood. Patient continues to stand in doorway staring off. Attempted to offer support and med education regarding compliance however patient is not receptive. No pain or physical problems reported. No SI/HI/AVH. Patient safe at this time. Natalie [name]

## 2014-09-10 NOTE — Progress Notes (Signed)
D. Pt in room and resting at this time. Earlier this evening, pt was up standing in the hallway, spoke about how she did not want to take the medications and spoke that they are the wrong medications. Pt did receive IM Geodon and cogentin without incident. Pt has been in room and has been at rest the past 2 hours. A. Support and encouragement provided. R. Safety maintained, will continue to monitor.

## 2014-09-10 NOTE — Progress Notes (Signed)
Received TC from patient's husband, Brendan Gruwell. Husband asking for update on patient's status - is she taking meds, attending groups, etc. Code verified. Located patient's consent form which indicates she does not want information released to anyone. Husband states per her nurse yesterday, patient gave verbal consent however form was not amended. Explained policy to husband who was angry and asked to speak to the charge RN Lupita Leash. While Lupita Leash spoke with him, this Clinical research associate approached patient about adding husband to the consent form. Patient stared at this writer when asked directly after explaining the purpose of the form. She verbalized understanding however would not answer. Informed patient that consent form would be available for her to sign if and when ready. Lupita Leash was attempting to give patient's husband the AD's name and phone number however husband hung up. Lawrence Marseilles

## 2014-09-10 NOTE — Progress Notes (Signed)
Pt up at this time and has been awake for the past hour. Pt had been in bed resting prior to this and received approximately 3 and a half to 4 hours of rest this morning.

## 2014-09-10 NOTE — [facility] Group Notes (Signed)
[facility] LCSW Group Therapy  09/10/2014 1:15 pm  Type of Therapy: Process Group Therapy  Participation Level:  Engaged pt just prior to group.  "I feel like I should be on a different floor. That's really bothering me.  I need to see my husband.  Can you call him?"  Responded that unless she is taking meds PO, she is right where she needs to be, and if she needs to talk to husband, she should call him.  Invited her to group.  She did not come.   Summary of Progress/Problems: Today's group addressed the issue of overcoming obstacles.  Patients were asked to identify their biggest obstacle post d/c that stands in the way of their on-going success, and then problem solve as to how to manage this.    Ida Rogue 09/10/2014   3:48 PM

## 2014-09-11 MED ORDER — LAMOTRIGINE 100 MG PO TABS
100.0000 mg | ORAL_TABLET | Freq: Two times a day (BID) | ORAL | Status: DC
  Administered 2014-09-11 – 2014-09-14 (×6): 100 mg via ORAL
  Filled 2014-09-11 (×5): qty 1
  Filled 2014-09-11: qty 28
  Filled 2014-09-11 (×2): qty 1
  Filled 2014-09-11: qty 28
  Filled 2014-09-11: qty 1

## 2014-09-11 NOTE — Progress Notes (Signed)
Patient ID: Natalie [name], female   DOB: 04-02-1991, 23 y.o.   MRN: [id number]   D:  [name] husband was visiting when writer went in for initial assessment. Husband asked the writer to describe pt's progress. Writer informed the husband that if consent hadn't been given the writer would not be able to discuss the pt's care. Husband suggested to the pt that she add his name "now".  Pt stated, "I have to think about that". [name] husband attempted to encourage pt to give consent. Pt stated, "I have to think about that because I don't want you talking to your mom." [name] husband stated, "It's just me and you". Pt stated, "well I have to think about that".  Writer attempted to assess pt afterwards and pt wouldn't answer questions.   A:  Support and encouragement was offered. 15 min checks continued for safety.  R: Pt remains safe.

## 2014-09-11 NOTE — [facility] Group Notes (Signed)
The focus of this group is to educate the patient on the purpose and policies of crisis stabilization and provide a format to answer questions about their admission.  The group details unit policies and expectations of patients while admitted.  Patient did not attend nurse education orientation group this morning.  Patient stayed in her room.  

## 2014-09-11 NOTE — Progress Notes (Signed)
D: Patient has flat, blunted affect and angry, irritable mood. Declined self inventory sheet today. Writer observed that the patient is guarded and unwilling to participate in daily activities. She has not been attending groups and often seen standing in the hallway, no interaction with others, just looking down at the floor in elective mutism. Patient has been resistant to care and has expressed or shown that she does not want to take her medications; when offered to her, she verbalizes that the medications does not belong to her, but after some time passes by she will eventually take it.  A: Support and encouragement provided to patient. Scheduled medications administered per MD orders. Maintain Q15 minute checks for safety.  R: Patient denies SI/HI/AVH. Remains safe on the unit.

## 2014-09-11 NOTE — Progress Notes (Signed)
[facility] MD Progress Note  09/11/2014 11:50 AM Natalie [name]  MRN:  [id number] Subjective:  Patient states,'I am not on my medications ,the medications you are giving me is not mine.' Objective: Patient seen and chart reviewed. Patient seen at her doorway ,standing still ,continues to be guarded, minimal response to questions ,some staring periodically. Patient took her PO medications this AM after a lot of encouragement. Patient continues to be paranoid ,delusional and suspicious . Patient mentioned something about talking to family . When writer directed her to the telephone room to contact her family ,she stood staring in the hallway .  Per slept better last night -got more hrs -per EMR.   Patient continues to be on forced medication order.     Diagnosis:   DSM5: Primary Psychiatric Diagnosis: Bipolar disorder, type I ,most recent episode depressed,severe with psychotic features.   Secondary Psychiatric Diagnosis: OCD per history   Non Psychiatric Diagnosis: None      Total Time spent with patient: 30 minutes   Past Medical History  Diagnosis Date  . Psychiatric diagnosis   . Chronic bipolar disorder   . OCD (obsessive compulsive disorder)   . H/O suicide attempt 05-2013    OD  . Anxiety   . Depression     ADL's:  Impaired  Sleep: Fair  Appetite:  Good   Psychiatric Specialty Exam: Physical Exam  ROS  Blood pressure 135/78, pulse 70, temperature 98.4 F (36.9 C), temperature source Oral, resp. rate 16, height 5\' 5"  (1.651 m), weight 68.04 kg (150 lb).Body mass index is 24.96 kg/(m^2).  General Appearance: Casual  Eye Contact::  Poor  Speech:  minimal    Volume:  Decreased  Mood:  Angry and Irritable  Affect:  Blunt  Thought Process:  Irrelevant  Orientation:  Other:  unable to assess  Thought Content:  Delusions and Paranoid Ideation   Suicidal Thoughts: denies  Homicidal Thoughts: denies  Memory:  Immediate;   unable to assess Recent;   unable  to assess Remote;   unable to assess  Judgement:  Poor  Insight:  Lacking  Psychomotor Activity:  Decreased  Concentration:  Poor  Recall:  NA  Fund of Knowledge:Fair  Language: Good  Akathisia:  NA  Handed:  Right  AIMS (if indicated):     Assets:  Social Support  Sleep:  Number of Hours: 5   Musculoskeletal: Strength & Muscle Tone: within normal limits Gait & Station: normal Patient leans: N/A  Current Medications: Current Facility-Administered Medications  Medication Dose Route Frequency Provider Last Rate Last Dose  . acetaminophen (TYLENOL) tablet 650 mg  650 mg Oral Q6H PRN [id number], NP      . alum & mag hydroxide-simeth (MAALOX/MYLANTA) 200-200-20 MG/5ML suspension 30 mL  30 mL Oral Q4H PRN 06-08-2001, NP      . alum & mag hydroxide-simeth (MAALOX/MYLANTA) 200-200-20 MG/5ML suspension 30 mL  30 mL Oral Q4H PRN Evanna 06-08-2001, NP      . benztropine (COGENTIN) tablet 0.5 mg  0.5 mg Oral BID Janann August, MD   0.5 mg at 09/11/14 09/13/14   Or  . benztropine mesylate (COGENTIN) injection 0.5 mg  0.5 mg Intramuscular BID 2952, MD   0.5 mg at 09/10/14 0800  . carbamazepine (TEGRETOL XR) 12 hr tablet 300 mg  300 mg Oral BID 11/10/14, MD   300 mg at 09/11/14 0847  . diphenhydrAMINE (BENADRYL) injection 50 mg  50 mg Intramuscular QHS 09/13/14, MD      .  feeding supplement (ENSURE COMPLETE) (ENSURE COMPLETE) liquid 237 mL  237 mL Oral BID BM Elyse A Shearer, RD      . hydrOXYzine (ATARAX/VISTARIL) tablet 25 mg  25 mg Oral Q6H PRN [name], MD      . ibuprofen (ADVIL,MOTRIN) tablet 600 mg  600 mg Oral Q8H PRN [name], NP      . lamoTRIgine (LAMICTAL) tablet 100 mg  100 mg Oral BID Syeda Prickett, MD      . magnesium hydroxide (MILK OF MAGNESIA) suspension 30 mL  30 mL Oral Daily PRN [name], NP   30 mL at 09/02/14 2212  . magnesium hydroxide (MILK OF MAGNESIA) suspension 30 mL  30 mL Oral Daily PRN Evanna Cori Burkett, NP      .  OLANZapine zydis (ZYPREXA) disintegrating tablet 5 mg  5 mg Oral Q6H PRN [name], MD      . traZODone (DESYREL) tablet 200 mg  200 mg Oral QHS Natelie Ostrosky, MD   200 mg at 09/10/14 2251  . ziprasidone (GEODON) injection 20 mg  20 mg Intramuscular BID WC Shun Pletz, MD   20 mg at 09/10/14 0800   Or  . ziprasidone (GEODON) capsule 80 mg  80 mg Oral BID WC [name], MD   80 mg at 09/11/14 1696    Lab Results:  No results found for this or any previous visit (from the past 48 hour(s)).  Physical Findings: AIMS: Facial and Oral Movements Muscles of Facial Expression: None, normal Lips and Perioral Area: None, normal Jaw: None, normal Tongue: None, normal,Extremity Movements Upper (arms, wrists, hands, fingers): None, normal Lower (legs, knees, ankles, toes): None, normal, Trunk Movements Neck, shoulders, hips: None, normal, Overall Severity Severity of abnormal movements (highest score from questions above): None, normal Incapacitation due to abnormal movements: None, normal Patient's awareness of abnormal movements (rate only patient's report): No Awareness, Dental Status Current problems with teeth and/or dentures?: No Does patient usually wear dentures?: No  CIWA:   COWS:   Treatment Plan Summary: Daily contact with patient to assess and evaluate symptoms and progress in treatment Medication management  Plan: Patient has been refusing her medications and hence started decompensating. Patient's family was asked to come in for a treatment team meeting and per family patient is not at baseline and this is how she acts when she is psychotic. Patient became catatonic and mute and would stand maintaining the same posture for hours -but has been improving. Patient is  on forced medications .  Encourage to participate in group and individual sessions Continue medication management/ and review as needed Medication management to treat current mood problems  Continue  Tegretol XR   300 MG PO bid for mood stabilization . Will increase lamictal to 100 mg po bid . Will continue tegretol for better symptom control. Will continue Geodon 80 mg po bid or Geodon IM if she refuses PO. Per family patient has had better response to Geodon in the past and would prefer patient to be on it. Forced medication order in chart. Placed order for EKG- Patient refused.will order again. Will continue trazodone 200 mg po qhs for sleep. However if patient refuses to take trazodone  ,then will give her Benadryl 50 mg IM at bedtime for sleep. Will continue Vistaril 25 mg po q6h prn. Group counseling sessions and activities.  Discharge  Plan in progress Address health issues /V/S as needed   Medical Decision Making Problem Points:  Established problem, worsening (2) Data  Points:  Order Aims Assessment (2) Review and summation of old records (2) Review of medication regiment & side effects (2)  I certify that inpatient services furnished can reasonably be expected to improve the patient's condition.   Natalie Finfrock  MD 09/11/2014, 11:50 AM

## 2014-09-11 NOTE — Progress Notes (Signed)
Adult Psychoeducational Group Note  Date:  09/11/2014 Time:  9:49 PM  Group Topic/Focus:  Wrap-Up Group:   The focus of this group is to help patients review their daily goal of treatment and discuss progress on daily workbooks.  Participation Level:  Minimal  Participation Quality:  Drowsy  Affect:  Flat  Cognitive:  Lacking  Insight: Limited  Engagement in Group:  Limited  Modes of Intervention:  Socialization and Support  Additional Comments:  Patient attended and had limited participation in group. She reports that she has been having some difficulty staying on track regarding the time with the schedule here. This work advised her that there is always an announcement whenever an activity is getting ready to get started and the workers on the unit does invite her also.  She advised that he husband visited with her today.  Lita Mains [facility] 09/11/2014, 9:49 PM

## 2014-09-12 MED ORDER — ZIPRASIDONE HCL 80 MG PO CAPS
80.0000 mg | ORAL_CAPSULE | Freq: Two times a day (BID) | ORAL | Status: DC
  Administered 2014-09-12 – 2014-09-14 (×4): 80 mg via ORAL
  Filled 2014-09-12 (×3): qty 1
  Filled 2014-09-12: qty 28
  Filled 2014-09-12 (×2): qty 1
  Filled 2014-09-12: qty 28
  Filled 2014-09-12: qty 1
  Filled 2014-09-12: qty 2

## 2014-09-12 MED ORDER — ZIPRASIDONE MESYLATE 20 MG IM SOLR
20.0000 mg | Freq: Two times a day (BID) | INTRAMUSCULAR | Status: DC
  Filled 2014-09-12 (×6): qty 20

## 2014-09-12 NOTE — [facility] Group Notes (Signed)
[facility] LCSW Aftercare Discharge Planning Group Note   09/12/2014 9:35 AM  Participation Quality:  Minimal   Mood/Affect:  Flat, Depressed, Resistant   Depression Rating:  "normal"   Anxiety Rating:  "Normal"   Thoughts of Suicide:  No Will you contract for safety?   NA  Current AVH:  No  Plan for Discharge/Comments:  Natalie [name] was focused on discharge but refused to discuss discharge plans. She did not have any medication complaints.   Transportation Means: Family   Supports: Family   [name],[name]

## 2014-09-12 NOTE — Progress Notes (Signed)
[facility] MD Progress Note  09/12/2014 1:39 PM Natalie [name]  MRN:  [id number] Subjective:  Patient states,'I am ok ,I just do not want to be here". Objective: Patient seen and chart reviewed. Patient seen in the hallway, standing leaning on the wall. Patient does not want to go to her room or treatment room to be seen. Patient reports she wants to go home. Reports she has been taking her medications and wants to go home . Does not want [place] to talk to family anymore and reports that she can make her own decisions. Patient continues to be guarded ,suspicious and paranoid. She would stand in the hallway for hours ,without moving. Patient however is improving since she is more communicative ,taking her PO medications . Patient's sleep is improved.  Patient continues to be on forced medication order.     Diagnosis:   DSM5: Primary Psychiatric Diagnosis: Bipolar disorder, type I ,most recent episode depressed,severe with psychotic features.   Secondary Psychiatric Diagnosis: OCD per history   Non Psychiatric Diagnosis: None      Total Time spent with patient: 30 minutes   Past Medical History  Diagnosis Date  . Psychiatric diagnosis   . Chronic bipolar disorder   . OCD (obsessive compulsive disorder)   . H/O suicide attempt 05-2013    OD  . Anxiety   . Depression     ADL's:  Impaired  Sleep: Good  Appetite:  Good   Psychiatric Specialty Exam: Physical Exam  ROS  Blood pressure 125/73, pulse 73, temperature 98.3 F (36.8 C), temperature source Oral, resp. rate 16, height 5\' 5"  (1.651 m), weight 68.04 kg (150 lb).Body mass index is 24.96 kg/(m^2).  General Appearance: Casual  Eye Contact::  Poor  Speech:  minimal  improving  Volume:  Decreased  Mood:  Angry and Irritablebut improving  Affect:  Blunt  Thought Process:  Irrelevant  Orientation:  Other:  unable to assess  Thought Content:  Delusions and Paranoid Ideation   Suicidal Thoughts: denies  Homicidal  Thoughts: denies  Memory:  Immediate;   Fair Recent;   Fair Remote;   Fair  Judgement:  Poor  Insight:  Lacking  Psychomotor Activity:  Decreased  Concentration:  Poor  Recall:  NA  Fund of Knowledge:Fair  Language: Good  Akathisia:  NA  Handed:  Right  AIMS (if indicated):     Assets:  Social Support  Sleep:  Number of Hours: 6   Musculoskeletal: Strength & Muscle Tone: within normal limits Gait & Station: normal Patient leans: N/A  Current Medications: Current Facility-Administered Medications  Medication Dose Route Frequency Provider Last Rate Last Dose  . acetaminophen (TYLENOL) tablet 650 mg  650 mg Oral Q6H PRN [id number], NP      . alum & mag hydroxide-simeth (MAALOX/MYLANTA) 200-200-20 MG/5ML suspension 30 mL  30 mL Oral Q4H PRN 06-08-2001, NP      . alum & mag hydroxide-simeth (MAALOX/MYLANTA) 200-200-20 MG/5ML suspension 30 mL  30 mL Oral Q4H PRN Evanna 06-08-2001, NP      . benztropine (COGENTIN) tablet 0.5 mg  0.5 mg Oral BID Janann August, MD   0.5 mg at 09/12/14 0807   Or  . benztropine mesylate (COGENTIN) injection 0.5 mg  0.5 mg Intramuscular BID 0808, MD   0.5 mg at 09/10/14 0800  . carbamazepine (TEGRETOL XR) 12 hr tablet 300 mg  300 mg Oral BID 11/10/14, MD   300 mg at 09/12/14 0806  . diphenhydrAMINE (  BENADRYL) injection 50 mg  50 mg Intramuscular QHS Myldred Raju, MD      . feeding supplement (ENSURE COMPLETE) (ENSURE COMPLETE) liquid 237 mL  237 mL Oral BID BM Elyse A Shearer, RD      . hydrOXYzine (ATARAX/VISTARIL) tablet 25 mg  25 mg Oral Q6H PRN Mazi Brailsford, MD      . ibuprofen (ADVIL,MOTRIN) tablet 600 mg  600 mg Oral Q8H PRN [name], NP      . lamoTRIgine (LAMICTAL) tablet 100 mg  100 mg Oral BID [name], MD   100 mg at 09/12/14 0806  . magnesium hydroxide (MILK OF MAGNESIA) suspension 30 mL  30 mL Oral Daily PRN [name], NP   30 mL at 09/02/14 2212  . magnesium hydroxide (MILK OF MAGNESIA) suspension 30 mL   30 mL Oral Daily PRN [name], NP      . OLANZapine zydis (ZYPREXA) disintegrating tablet 5 mg  5 mg Oral Q6H PRN [name], MD      . traZODone (DESYREL) tablet 200 mg  200 mg Oral QHS [name], MD   200 mg at 09/11/14 2114  . ziprasidone (GEODON) injection 20 mg  20 mg Intramuscular BID WC Sidni Fusco, MD   20 mg at 09/10/14 0800   Or  . ziprasidone (GEODON) capsule 80 mg  80 mg Oral BID WC Darnisha Vernet, MD   80 mg at 09/12/14 0806    Lab Results:  No results found for this or any previous visit (from the past 48 hour(s)).  Physical Findings: AIMS: Facial and Oral Movements Muscles of Facial Expression: None, normal Lips and Perioral Area: None, normal Jaw: None, normal Tongue: None, normal,Extremity Movements Upper (arms, wrists, hands, fingers): None, normal Lower (legs, knees, ankles, toes): None, normal, Trunk Movements Neck, shoulders, hips: None, normal, Overall Severity Severity of abnormal movements (highest score from questions above): None, normal Incapacitation due to abnormal movements: None, normal Patient's awareness of abnormal movements (rate only patient's report): No Awareness, Dental Status Current problems with teeth and/or dentures?: No Does patient usually wear dentures?: No  CIWA:   COWS:   Treatment Plan Summary: Daily contact with patient to assess and evaluate symptoms and progress in treatment Medication management  Plan: Patient has been refusing her medications and hence started decompensating. Patient's family was asked to come in for a treatment team meeting and per family patient is not at baseline and this is how she acts when she is psychotic. Patient became catatonic and mute and would stand maintaining the same posture for hours -but patient continues to improve.  Encourage to participate in group and individual sessions Continue medication management/ and review as needed Medication management to treat current  mood problems  Continue Tegretol XR   300 MG PO bid for mood stabilization . Will continue lamictal 100 mg po bid . Will continue tegretol for better symptom control. Will continue Geodon 80 mg po bid or Geodon IM if she refuses PO. Per family patient has had better response to Geodon in the past and would prefer patient to be on it. Forced medication order in chart. Placed order for EKG- Patient refused.will order again. Will continue trazodone 200 mg po qhs for sleep. However if patient refuses to take trazodone  ,then will give her Benadryl 50 mg IM at bedtime for sleep. Will continue Vistaril 25 mg po q6h prn.  Medical Decision Making Problem Points:  Established problem, stable/improving (1) Data Points:  Order Aims  Assessment (2) Review and summation of old records (2) Review of medication regiment & side effects (2)  I certify that inpatient services furnished can reasonably be expected to improve the patient's condition.   Marbella Markgraf  MD 09/12/2014, 1:39 PM

## 2014-09-12 NOTE — [facility] Group Notes (Signed)
[facility] LCSW Group Therapy  09/12/2014 1:14 PM  Type of Therapy:  Group Therapy  Participation Level:  None  Participation Quality:  Drowsy  Affect:  Flat  Cognitive:  Appropriate  Insight:  Limited  Engagement in Therapy:  Limited  Modes of Intervention:  Discussion, Education, Socialization and Support  Summary of Progress/Problems:[employer] ([employer]) speaker came to talk about his personal journey with substance abuse and mental illness. Group members were challenged to process ways by which to relate to the speaker. [employer] speaker provided handouts and educational information pertaining to groups and services offered by the [facility]. Pt stated she enjoyed the music.     [name],Natalie [name] 09/12/2014, 1:14 PM

## 2014-09-12 NOTE — Progress Notes (Signed)
Pt stated that she is still dealing with her anger issues. Her goal for tomorrow is to see how well is can control her anger and depression.

## 2014-09-12 NOTE — Progress Notes (Signed)
D: Patient's affect and mood continues to be depressed. She verbalized earlier this morning that she spoke with her spouse and he told her that she's not getting out and this makes her angry. Patient also voiced that everyone is telling her that if she takes her medication she'll be able to leave the hospital, but does not believe it; expressing as well that she's tired of being behind locked doors and wants out of here.  A: Support and encouragement provided to patient. Administered scheduled medications per ordering MD. Monitor Q15 minute checks for safety.  R: Patient receptive. Denies SI/HI. Patient remains safe on the unit.

## 2014-09-12 NOTE — Tx Team (Signed)
Interdisciplinary Treatment Plan Update (Adult)  Date: 09/12/2014 Time Reviewed: 12:43 PM  Progress in Treatment:  Attending groups: No Participating in groups:  No Taking medication as prescribed: Yes  Tolerating medication: Yes  Family/Significant othe contact made:Yes, Husband  Patient understands diagnosis: Yes  Discussing patient identified problems/goals with staff: Yes  Medical problems stabilized or resolved: Yes  Denies suicidal/homicidal ideation: Yes Patient has not harmed self or Others: Yes  New problem(s) identified: N/A Discharge Plan or Barriers: Pt refuses to discuss discharge plans. Pt is either going to live with her parents or husband. CSW is still assessing. She will likely return to parents as husband has stated that she is not welcome to return with couple with whom she and he were staying prior to admission Additional comments: She was receiving injections from Friday 09/07/14 until Monday 09/10/14. On Monday afternoon, she began to take her medications orally.    Reason for Continuation of Hospitalization:  Medication stabilization  Other; describe Mood lability, non participation in therapeutic milieu      Estimated length of stay: 3-4 days   Attendees:  Patient:  09/12/2014 12:43 PM   Family:  10/14/201512:43 PM   Physician: Dr. Elna Breslow MD  10/14/201512:43 PM  Nursing: Harold Barban, RN 09/12/2014 12:43 PM  Clinical Social Worker: Daryel Gerald, [place]  10/14/201512:43 PM  Clinical Social Worker: Charleston Ropes, CSW Intern 10/14/201512:43 PM  Other:  10/14/201512:43 PM  Other:  10/14/201512:43 PM  Other:  10/14/201512:43 PM  Scribe for Treatment Team:  Charleston Ropes, CSW Intern 09/12/2014 12:43 PM

## 2014-09-13 NOTE — [facility] Group Notes (Signed)
[facility] Group Notes:  (Counselor/Nursing/MHT/Case Management/Adjunct)  09/13/2014 1:15PM  Type of Therapy:  Group Therapy  Participation Level:  Active  Participation Quality:  Appropriate  Affect:  Flat  Cognitive:  Oriented  Insight:  Improving  Engagement in Group:  Limited  Engagement in Therapy:  Limited  Modes of Intervention:  Discussion, Exploration and Socialization  Summary of Progress/Problems: The topic for group was balance in life.  Pt participated in the discussion about when their life was in balance and out of balance and how this feels.  Pt discussed ways to get back in balance and short term goals they can work on to get where they want to be.  Natalie [name] stayed in group for the entire time.  She said she likes tigers "because they are the only angry cat."  After that closed her eyes as if asleep, and kept them closed.   Daryel Gerald B 09/13/2014 5:19 PM

## 2014-09-13 NOTE — Progress Notes (Signed)
D: Patient presents with depressed affect and mood, but much brighter today as opposed to the last two previous shifts. Patient is communicating more with staff and no longer pacing the hallway or just standing in one position along the sides of the walk through. She's actively participating in groups and visible in the milieu. Patient is compliant with all medications and tolerating them well.  A: Support and encouragement provided to patient. Scheduled medications administered per MD orders. Maintain Q15 minute checks for safety.  R: Patient receptive. Denies SI/HI and AVH. Patient remains safe.

## 2014-09-13 NOTE — Progress Notes (Signed)
D: Pt denies SI/HI/AVH. Pt is pleasant and cooperative. Pt stated she felt much better today. Pt continues to be flat  And has minimal interaction on the unit even though she is visible on the unit.  A: Pt was offered support and encouragement. Pt was given scheduled medications. Pt was encourage to attend groups. Q 15 minute checks were done for safety.   R:Pt attends groups and interacts well with peers and staff. Pt is taking medication.Pt receptive to treatment and safety maintained on unit.

## 2014-09-13 NOTE — Progress Notes (Signed)
D: Patient denies SI/HI/AVH. Patient affect is flat. Mood is depressed.  Patient did attend evening group. Patient visible on the milieu. No distress noted. A: Support and encouragement offered. Scheduled medications given to pt. Q 15 min checks continued for patient safety. R: Patient receptive. Patient remains safe on the unit.   

## 2014-09-13 NOTE — Progress Notes (Signed)
Adult Psychoeducational Group Note  Date:  09/13/2014 Time:  10:30 AM   Group Topic/Focus:  Morning Wellness Group  Participation Level:  Active  Participation Quality:  Appropriate and Attentive  Affect:  Appropriate  Cognitive:  Alert and Oriented  Insight: Good  Engagement in Group:  Engaged  Modes of Intervention:  Discussion  Additional Comments: Patient verbalized that she wants to work on her coping skills and get back to doing things she loves like singing and writing music.  Harold Barban E 09/13/2014, 3:59 PM

## 2014-09-13 NOTE — Progress Notes (Signed)
[facility] MD Progress Note  09/13/2014 11:48 AM Natalie [name]  MRN:  [id number] Subjective:  Patient states,'I am better " Objective: Patient seen and chart reviewed. Patient seen in treatment room ,appears to be very calm ,cooperative . Patient appears to be more interactive with staff and other peers. Patient has been taking all her PO medications with some encouragement. Patient is less suspicious and paranoid.  Patient reports sleep as fair and appetite as improved . Patient denies SI/HI/AH/VH. Patient denies any side effects of medications.  Plan to discharge patient tomorrow ,if she continues to be stable.       Diagnosis:   DSM5: Primary Psychiatric Diagnosis: Bipolar disorder, type I ,most recent episode depressed,severe with psychotic features.   Secondary Psychiatric Diagnosis: OCD per history   Non Psychiatric Diagnosis: None      Total Time spent with patient: 30 minutes   Past Medical History  Diagnosis Date  . Psychiatric diagnosis   . Chronic bipolar disorder   . OCD (obsessive compulsive disorder)   . H/O suicide attempt 05-2013    OD  . Anxiety   . Depression     ADL's:  Impaired improving  Sleep: Good  Appetite:  Good   Psychiatric Specialty Exam: Physical Exam  ROS  Blood pressure 123/75, pulse 94, temperature 97.8 F (36.6 C), temperature source Oral, resp. rate 20, height 5\' 5"  (1.651 m), weight 68.04 kg (150 lb).Body mass index is 24.96 kg/(m^2).  General Appearance: Casual  Eye Contact::  Poor  Speech:  Normal Rate improving  Volume:  Decreased  Mood:  Anxiousbut improving  Affect:  Blunt  Thought Process:  Irrelevant and improving  Orientation:  Full (Time, Place, and Person)  Thought Content:  Delusions and Paranoid Ideation resolving  Suicidal Thoughts: denies  Homicidal Thoughts: denies  Memory:  Immediate;   Fair Recent;   Fair Remote;   Fair  Judgement:  Poor improving  Insight:  Lacking  Psychomotor Activity:   Normal  Concentration:  Fair  Recall:  002.002.002.002 of Knowledge:Fair  Language: Good  Akathisia:  NA  Handed:  Right  AIMS (if indicated):     Assets:  Social Support  Sleep:  Number of Hours: 6   Musculoskeletal: Strength & Muscle Tone: within normal limits Gait & Station: normal Patient leans: N/A  Current Medications: Current Facility-Administered Medications  Medication Dose Route Frequency Provider Last Rate Last Dose  . acetaminophen (TYLENOL) tablet 650 mg  650 mg Oral Q6H PRN [employer], NP      . alum & mag hydroxide-simeth (MAALOX/MYLANTA) 200-200-20 MG/5ML suspension 30 mL  30 mL Oral Q4H PRN 06-08-2001, NP      . alum & mag hydroxide-simeth (MAALOX/MYLANTA) 200-200-20 MG/5ML suspension 30 mL  30 mL Oral Q4H PRN Evanna 06-08-2001, NP      . benztropine (COGENTIN) tablet 0.5 mg  0.5 mg Oral BID Janann August, MD   0.5 mg at 09/13/14 0850   Or  . benztropine mesylate (COGENTIN) injection 0.5 mg  0.5 mg Intramuscular BID 09/15/14, MD   0.5 mg at 09/10/14 0800  . carbamazepine (TEGRETOL XR) 12 hr tablet 300 mg  300 mg Oral BID 11/10/14, MD   300 mg at 09/13/14 0850  . diphenhydrAMINE (BENADRYL) injection 50 mg  50 mg Intramuscular QHS Brandin Dilday, MD      . feeding supplement (ENSURE COMPLETE) (ENSURE COMPLETE) liquid 237 mL  237 mL Oral BID BM Elyse A Shearer, RD      .  hydrOXYzine (ATARAX/VISTARIL) tablet 25 mg  25 mg Oral Q6H PRN [name], MD      . ibuprofen (ADVIL,MOTRIN) tablet 600 mg  600 mg Oral Q8H PRN [name], NP      . lamoTRIgine (LAMICTAL) tablet 100 mg  100 mg Oral BID [name], MD   100 mg at 09/13/14 0850  . magnesium hydroxide (MILK OF MAGNESIA) suspension 30 mL  30 mL Oral Daily PRN [name], NP   30 mL at 09/02/14 2212  . magnesium hydroxide (MILK OF MAGNESIA) suspension 30 mL  30 mL Oral Daily PRN Audrea Muscat, NP   30 mL at 09/12/14 2030  . OLANZapine zydis (ZYPREXA) disintegrating tablet 5 mg  5 mg Oral Q6H  PRN [name], MD      . traZODone (DESYREL) tablet 200 mg  200 mg Oral QHS Srijan Givan, MD   200 mg at 09/12/14 2030  . ziprasidone (GEODON) capsule 80 mg  80 mg Oral BID WC Bentlie Catanzaro, MD   80 mg at 09/13/14 0850   Or  . ziprasidone (GEODON) injection 20 mg  20 mg Intramuscular BID WC [name], MD        Lab Results:  No results found for this or any previous visit (from the past 48 hour(s)).  Physical Findings: AIMS: Facial and Oral Movements Muscles of Facial Expression: None, normal Lips and Perioral Area: None, normal Jaw: None, normal Tongue: None, normal,Extremity Movements Upper (arms, wrists, hands, fingers): None, normal Lower (legs, knees, ankles, toes): None, normal, Trunk Movements Neck, shoulders, hips: None, normal, Overall Severity Severity of abnormal movements (highest score from questions above): None, normal Incapacitation due to abnormal movements: None, normal Patient's awareness of abnormal movements (rate only patient's report): No Awareness, Dental Status Current problems with teeth and/or dentures?: No Does patient usually wear dentures?: No  CIWA:   COWS:   Treatment Plan Summary: Daily contact with patient to assess and evaluate symptoms and progress in treatment Medication management  Plan: Patient had been refusing her medications and hence started decompensating. Patient's family was asked to come in for a treatment team meeting and per family patient is not at baseline and this is how she acts when she is psychotic. Patient became catatonic and mute and would stand maintaining the same posture for hours -but patient continues to improve on her treatment and current medication regimen. Plan to discharge patient tomorrow.  Encourage to participate in group and individual sessions Continue medication management/ and review as needed Medication management to treat current mood problems  Continue Tegretol XR   300 MG PO bid for mood  stabilization . Will continue lamictal 100 mg po bid . Will continue tegretol for better symptom control. Will continue Geodon 80 mg po bid or Geodon IM if she refuses PO. Per family patient has had better response to Geodon in the past and would prefer patient to be on it. Forced medication order in chart. Placed order for EKG- Patient refused. Will continue trazodone 200 mg po qhs for sleep. However if patient refuses to take trazodone  ,then will give her Benadryl 50 mg IM at bedtime for sleep.Patient took trazodone last night. Will continue Vistaril 25 mg po q6h prn.  Medical Decision Making Problem Points:  Established problem, stable/improving (1) Data Points:  Order Aims Assessment (2) Review and summation of old records (2) Review of medication regiment & side effects (2)  I certify that inpatient services furnished can reasonably be expected to  improve the patient's condition.   Natalie Brougher  MD 09/13/2014, 11:48 AM

## 2014-09-14 MED ORDER — LAMOTRIGINE 100 MG PO TABS: 100.0000 mg | ORAL_TABLET | Freq: Two times a day (BID) | ORAL | Status: DC

## 2014-09-14 MED ORDER — BENZTROPINE MESYLATE 0.5 MG PO TABS: 0.5000 mg | ORAL_TABLET | Freq: Two times a day (BID) | ORAL | Status: DC

## 2014-09-14 MED ORDER — HYDROXYZINE HCL 25 MG PO TABS: 25.0000 mg | ORAL_TABLET | Freq: Four times a day (QID) | ORAL | Status: DC | PRN

## 2014-09-14 MED ORDER — ZIPRASIDONE HCL 80 MG PO CAPS: 80.0000 mg | ORAL_CAPSULE | Freq: Two times a day (BID) | ORAL | Status: DC

## 2014-09-14 MED ORDER — CARBAMAZEPINE ER 100 MG PO TB12: 300.0000 mg | ORAL_TABLET | Freq: Two times a day (BID) | ORAL | Status: DC

## 2014-09-14 MED ORDER — TRAZODONE HCL 100 MG PO TABS
200.0000 mg | ORAL_TABLET | Freq: Every day | ORAL | Status: DC
Start: 2014-09-14 — End: 2014-10-24

## 2014-09-14 NOTE — Progress Notes (Signed)
[facility] Adult Case Management Discharge Plan :  Will you be returning to the same living situation after discharge: Yes,  home At discharge, do you have transportation home?:Yes,  mother Do you have the ability to pay for your medications:Yes,  MCD  Release of information consent forms completed and in the chart;  Patient's signature needed at discharge.  Patient to Follow up at: Follow-up Information   Follow up with [facility] On 09/17/2014. (Monday at 1:00 with Dennie Bible )    Contact information:   [address]  [place]  [phone]] [phone] F: [phone]      Follow up with [facility].   Contact information:   [address]  [place]  [336] [phone]      Patient denies SI/HI:   Yes,  yes    Safety Planning and Suicide Prevention discussed:  Yes,  yes  Daryel Gerald B 09/14/2014, 10:10 AM

## 2014-09-14 NOTE — [facility] Suicide Risk Assessment (Signed)
   Demographic Factors:  Low socioeconomic status  Total Time spent with patient: 45 minutes  Psychiatric Specialty Exam: Physical Exam  Constitutional: She is oriented to person, place, and time. She appears well-developed and well-nourished.  HENT:  Head: Normocephalic and atraumatic.  Neck: Normal range of motion. Neck supple.  Cardiovascular: Normal rate.   Respiratory: Effort normal.  GI: Soft.  Neurological: She is alert and oriented to person, place, and time.  Skin: Skin is warm.  Psychiatric: She has a normal mood and affect. Her speech is normal and behavior is normal. Judgment and thought content normal. Cognition and memory are normal.    Review of Systems  Constitutional: Negative.   HENT: Negative.   Eyes: Negative.   Respiratory: Negative.   Cardiovascular: Negative.   Gastrointestinal: Negative.   Genitourinary: Negative.   Musculoskeletal: Negative.   Skin: Negative.   Neurological: Negative.   Psychiatric/Behavioral: Negative for depression, suicidal ideas and hallucinations.    Blood pressure 117/68, pulse 80, temperature 98.1 F (36.7 C), temperature source Oral, resp. rate 20, height 5\' 5"  (1.651 m), weight 68.04 kg (150 lb).Body mass index is 24.96 kg/(m^2).  General Appearance: Casual  Eye Contact::  Fair  Speech:  Clear and Coherent  Volume:  Normal  Mood:  Euthymic  Affect:  Congruent  Thought Process:  Coherent  Orientation:  Full (Time, Place, and Person)  Thought Content:  WDL  Suicidal Thoughts:  No  Homicidal Thoughts:  No  Memory:  Immediate;   Fair Recent;   Fair Remote;   Fair  Judgement:  Fair  Insight:  Fair  Psychomotor Activity:  Normal  Concentration:  Fair  Recall:  002.002.002.002 of Knowledge:Good  Language: Good  Akathisia:  No    AIMS (if indicated):     Assets:  Communication Skills Desire for Improvement Social Support  Sleep:  Number of Hours: 4.25    Musculoskeletal: Strength & Muscle Tone: within normal  limits Gait & Station: normal Patient leans: N/A   Mental Status Per Nursing Assessment::   On Admission:     Current Mental Status by Physician: denies SI/HI/AH/VH  Loss Factors: Decrease in vocational status  Historical Factors: Prior suicide attempts and Impulsivity  Risk Reduction Factors:   Positive social support  Continued Clinical Symptoms:  Previous Psychiatric Diagnoses and Treatments  Cognitive Features That Contribute To Risk:  Polarized thinking    Suicide Risk:  Minimal: No identifiable suicidal ideation.  Patients presenting with no risk factors but with morbid ruminations; may be classified as minimal risk based on the severity of the depressive symptoms  Discharge Diagnoses: DSM5:  Primary Psychiatric Diagnosis:  Bipolar disorder, type I ,most recent episode depressed,severe with psychotic features (ACUTE PHASE -RESOLVED)  Secondary Psychiatric Diagnosis:  OCD per history   Non Psychiatric Diagnosis:  None    Past Medical History  Diagnosis Date  . Psychiatric diagnosis   . Chronic bipolar disorder   . OCD (obsessive compulsive disorder)   . H/O suicide attempt 05-2013    OD  . Anxiety   . Depression     Plan Of Care/Follow-up recommendations:  Activity:  No restrictions  Is patient on multiple antipsychotic therapies at discharge:  No   Has Patient had three or more failed trials of antipsychotic monotherapy by history:  No  Recommended Plan for Multiple Antipsychotic Therapies: NA    Natalie Hoffmeister md 09/14/2014, 2:22 PM

## 2014-09-14 NOTE — Discharge Summary (Signed)
Physician Discharge Summary Note  Patient:  Natalie [name] is an 23 y.o., female MRN:  [id number] DOB:  Aug 20, 1991 Patient phone:  [phone] (home)  Patient address:   [address] [place] [address],  Total Time spent with patient: 45 minutes  Date of Admission:  08/31/2014 Date of Discharge: 09/15/2015  Reason for Admission:  Bipolar Disorder   Discharge Diagnoses: Principal Problem:   Bipolar disorder, curr episode mixed, severe, w/o psychotic features Active Problems:   Bipolar 1 disorder   Psychiatric Specialty Exam: Physical Exam  Vitals reviewed. Psychiatric: She has a normal mood and affect. Her speech is normal and behavior is normal. Judgment and thought content normal. Cognition and memory are normal.    Review of Systems  Constitutional: Negative.   HENT: Negative.   Eyes: Negative.   Respiratory: Negative.   Cardiovascular: Negative.   Gastrointestinal: Negative.   Genitourinary: Negative.   Musculoskeletal: Negative.   Skin: Negative.   Neurological: Negative.   Endo/Heme/Allergies: Negative.   Psychiatric/Behavioral: Negative for suicidal ideas, hallucinations, memory loss and substance abuse. Depression: Hx of, chronic, stabilized. The patient is nervous/anxious (Hx of, chronic). The patient does not have insomnia.     Blood pressure 117/68, pulse 80, temperature 98.1 F (36.7 C), temperature source Oral, resp. rate 20, height 5' 5" (1.651 m), weight 68.04 kg (150 lb).Body mass index is 24.96 kg/(m^2).    Past Psychiatric History:  Diagnosis: Bipolar disorder, OCD   Hospitalizations: [facility] X 4   Outpatient Care: [facility]   Substance Abuse Care: none   Self-Mutilation: none   Suicidal Attempts: at least twice in the past by overdose   Violent Behaviors:    Musculoskeletal: Strength & Muscle Tone: within normal limits Gait & Station: normal Patient leans: N/A   DSM5: Discharge diagnosis  Primary Psychiatric Diagnosis:  Bipolar  disorder, type I ,most recent episode depressed,severe with psychotic features (ACUTE PHASE -RESOLVED)   Secondary Psychiatric Diagnosis:  OCD per history   Non Psychiatric Diagnosis:  None   Past Medical History  Diagnosis Date  . Psychiatric diagnosis   . Chronic bipolar disorder   . OCD (obsessive compulsive disorder)   . H/O suicide attempt 05-2013    OD  . Anxiety   . Depression    Level of Care:  OP  Hospital Course: Natalie [name] is a 23 yo female patient admitted from [facility] under IVC brought in by her spouse.  Patient has a long hx of Bipolar disorder and is non compliant with her medications.  Police found her wandering in the street and brought her to [facility].  At time of admission, she was unable to help complete her admission assessment.  She reported having her family "gang up" on her and brought her in.  Furthermore, her speech was disorganized and her thoughts, tangential and circumstantial.   She denied SI/HI/AVH.   Natalie [name] was accepted for further evaluation and treatment.  The goal was for crisis stabilization, and to restart all of her home medications.   Natalie [name] was monitored daily for adverse medication side effects and also monitored for daily progress for medication efficacy. Patient was initially started on Tegretol XR 300 MG PO and Latuda ,however she started refusing all her medications and started decompensating to the point that she became catatonic. Family meeting was conducted in an attempt to encourage her to take medications ,her husband Mr.Parkerson met with treatment team . Per family ,patient presents this way when she is psychotic and the only medication that would  have worked for her could have been [place]. Patient did well on Lithium in the past ,however she over dosed on it and hence was taken off it. Patient hence was started on Geodon and a forced medication order was placed after second opinion from another psychiatrist. Patient was also started on Lamictal  100 mg po to better .Trazodone 200 mg QHS insomnia and Hydroxyzine 25 mg PRN anxiety.  She was also encouraged to participate in group therapy.  Initially, she was reluctant and did not attned group/  Towards end, she did manage to learn to make short term goals as a way to cope with daily stressors.  CSW helped to facilitate follow up treatments at [facility] and [facility].  On day of discharge, Natalie [name] was calm and cooperative.  Patient was able to comprehend discharge instructions.  Patient was encouraged to be adherent to follow up treatments and medication management.  She was given samples of prescribed meds from [facility].   Patient was also referred to a day program on discharge .  Consults:  psychiatry  Significant Diagnostic Studies:  labs: Per ED  Discharge Vitals:   Blood pressure 117/68, pulse 80, temperature 98.1 F (36.7 C), temperature source Oral, resp. rate 20, height 5' 5" (1.651 m), weight 68.04 kg (150 lb). Body mass index is 24.96 kg/(m^2). Lab Results:   No results found for this or any previous visit (from the past 72 hour(s)).  Physical Findings: AIMS: Facial and Oral Movements Muscles of Facial Expression: None, normal Lips and Perioral Area: None, normal Jaw: None, normal Tongue: None, normal,Extremity Movements Upper (arms, wrists, hands, fingers): None, normal Lower (legs, knees, ankles, toes): None, normal, Trunk Movements Neck, shoulders, hips: None, normal, Overall Severity Severity of abnormal movements (highest score from questions above): None, normal Incapacitation due to abnormal movements: None, normal Patient's awareness of abnormal movements (rate only patient's report): No Awareness, Dental Status Current problems with teeth and/or dentures?: No Does patient usually wear dentures?: No  CIWA:  CIWA-Ar Total: 10 COWS:  COWS Total Score: 4  Psychiatric Specialty Exam: See Psychiatric Specialty Exam and Suicide Risk Assessment completed by Attending  Physician prior to discharge.  Discharge destination:  Home  Is patient on multiple antipsychotic therapies at discharge:  No   Has Patient had three or more failed trials of antipsychotic monotherapy by history:  No  Recommended Plan for Multiple Antipsychotic Therapies: NA     Medication List    STOP taking these medications       LATUDA 20 MG Tabs  Generic drug:  Lurasidone HCl      TAKE these medications     Indication   benztropine 0.5 MG tablet  Commonly known as:  COGENTIN  Take 1 tablet (0.5 mg total) by mouth 2 (two) times daily. For involuntary movements brought on by medication   Indication:  Extrapyramidal Reaction caused by Medications     carbamazepine 100 MG 12 hr tablet  Commonly known as:  TEGRETOL XR  Take 3 tablets (300 mg total) by mouth 2 (two) times daily. For mood stabilization   Indication:  Manic-Depression, Mood stabilization     hydrOXYzine 25 MG tablet  Commonly known as:  ATARAX/VISTARIL  Take 1 tablet (25 mg total) by mouth every 6 (six) hours as needed for anxiety. For anxiety   Indication:  Anxiety Neurosis     lamoTRIgine 100 MG tablet  Commonly known as:  LAMICTAL  Take 1 tablet (100 mg total) by mouth 2 (two) times  daily.   Indication:  Manic-Depression, Mood Stabilization     traZODone 100 MG tablet  Commonly known as:  DESYREL  Take 2 tablets (200 mg total) by mouth at bedtime. For sleeplessness and depression   Indication:  for sleeplessness and depression     ziprasidone 80 MG capsule  Commonly known as:  GEODON  Take 1 capsule (80 mg total) by mouth 2 (two) times daily with a meal. For mood stabilization   Indication:  Mood Stabilization       Follow-up Information   Follow up with [facility] On 09/17/2014. (Monday at 1:00 with Fraser Din )    Contact information:   [place]: [phone]      Follow up with [facility] On 09/17/2014. (Monday at 11:20 with Dr Margarito Liner)    Contact information:    [place] [phone]      Follow-up recommendations:  Activity:  As tolerated Diet:  As tolerated  Comments:  1.  Take all your medications as prescribed.              2.  Report any adverse side effects to outpatient provider.                       3.  Patient instructed to not use alcohol or illegal drugs while on prescription medicines.            4.  In the event of worsening symptoms, instructed patient to call 911, the crisis hotline or go to nearest emergency room for evaluation of symptoms.  Total Discharge Time:  Greater than 30 minutes.  SignedKerrie Buffalo MAY, AGNP-BC 09/14/2014, 1:12 PM   Patient was seen face to face for psychiatric evaluation, suicide risk assessment and case discussed with treatment team and NP and made appropriate disposition plans. Reviewed the information documented and agree with the treatment plan.     Ursula Alert ,MD Attending [facility]

## 2014-09-14 NOTE — [facility]HH Suicide Risk Assessment (Deleted)
   Demographic Factors:  Low socioeconomic status  Total Time spent with patient: 45 minutes  Psychiatric Specialty Exam: Physical Exam  Constitutional: She is oriented to person, place, and time. She appears well-developed and well-nourished.  HENT:  Head: Normocephalic and atraumatic.  Neck: Normal range of motion. Neck supple.  Respiratory: Effort normal.  GI: Soft.  Musculoskeletal: Normal range of motion.  Neurological: She is alert and oriented to person, place, and time.  Skin: Skin is warm.  Psychiatric: She has a normal mood and affect. Her speech is normal and behavior is normal. Judgment normal. Thought content is not paranoid. Cognition and memory are normal. She expresses no homicidal and no suicidal ideation.    Review of Systems  Constitutional: Negative.   HENT: Negative.   Eyes: Negative.   Respiratory: Negative.   Cardiovascular: Negative.   Gastrointestinal: Negative.   Genitourinary: Negative.   Musculoskeletal: Negative.   Skin: Negative.   Neurological: Negative.   Psychiatric/Behavioral: Negative for depression, suicidal ideas and hallucinations.    Blood pressure 117/68, pulse 80, temperature 98.1 F (36.7 C), temperature source Oral, resp. rate 20, height 5\' 5"  (1.651 m), weight 68.04 kg (150 lb).Body mass index is 24.96 kg/(m^2).  General Appearance: Casual  Eye Contact::  Fair  Speech:  Clear and Coherent  Volume:  Normal  Mood:  Euthymic  Affect:  Congruent  Thought Process:  Coherent  Orientation:  Full (Time, Place, and Person)  Thought Content:  WDL  Suicidal Thoughts:  No  Homicidal Thoughts:  No  Memory:  Immediate;   Fair Recent;   Fair Remote;   Fair  Judgement:  Fair  Insight:  Fair  Psychomotor Activity:  Normal  Concentration:  Fair  Recall:  [id number] of Knowledge:Good  Language: Good  Akathisia:  No    AIMS (if indicated):     Assets:  [employer] Social Support  Sleep:  Number of Hours:  4.25    Musculoskeletal: Strength & Muscle Tone: within normal limits Gait & Station: normal Patient leans: N/A   Mental Status Per Nursing Assessment::   On Admission:     Current Mental Status by Physician: Denies SI/HI/AH/VH  Loss Factors: Financial problems/change in socioeconomic status  Historical Factors: Prior suicide attempts and Impulsivity  Risk Reduction Factors:   Positive social support  Continued Clinical Symptoms:  Previous Psychiatric Diagnoses and Treatments  Cognitive Features That Contribute To Risk:  Polarized thinking    Suicide Risk:  Minimal: No identifiable suicidal ideation.  Patients presenting with no risk factors but with morbid ruminations; may be classified as minimal risk based on the severity of the depressive symptoms  Discharge Diagnoses: DSM5:  Primary Psychiatric Diagnosis:  Bipolar disorder, type I ,most recent episode depressed,severe with psychotic features (acute phase resolved).   Secondary Psychiatric Diagnosis:  OCD per history   Non Psychiatric Diagnosis:  None  Past Medical History  Diagnosis Date  . Psychiatric diagnosis   . Chronic bipolar disorder   . OCD (obsessive compulsive disorder)   . H/O suicide attempt 05-2013    OD  . Anxiety   . Depression     Plan Of Care/Follow-up recommendations:  Activity:  No restrictions  Is patient on multiple antipsychotic therapies at discharge:  No   Has Patient had three or more failed trials of antipsychotic monotherapy by history:  No  Recommended Plan for Multiple Antipsychotic Therapies: NA    Felicidad Sugarman MD 09/14/2014, 9:19 AM

## 2014-09-14 NOTE — Progress Notes (Signed)
B[facility] Group Notes:  (Nursing/MHT/Case Management/Adjunct)  Date:  09/14/2014  Time:  1:28 PM  Type of Therapy:  Therapeutic Activity  Participation Level:  Active  Participation Quality:  Appropriate  Affect:  Appropriate  Cognitive:  Appropriate  Insight:  Appropriate  Engagement in Group:  Engaged  Modes of Intervention:  Activity  Summary of Progress/Problems: Pts played an activity of Pictionary using coping skills. Pt was engaged and actively participated.  Hermione Havlicek C 09/14/2014, 1:28 PM 

## 2014-09-14 NOTE — Progress Notes (Signed)
Patient ID: ABEL RA, female   DOB: 12-Feb-1991, 23 y.o.   MRN: [id number] Discharge Note. D. Orders received for discharge. Patient reports '' I'm ready to go. My mom will be able to come and get me . '' Discharge instructions reviewed with patient, AVS reviewed at length. Rx provided with free supply of medications. Crisis services reviewed. Patient verbalized understanding of discharge instructions and follow up care. She denies any SI/HI or AH/VH. No signs of acute decompensation. All belongings returned. Pt escorted from unit to lobby to the care of mother.

## 2014-09-14 NOTE — Progress Notes (Signed)
Patient ID: Natalie [name], female   DOB: March 14, 1991, 23 y.o.   MRN: [id number] D.   Pt presents with depressed mood, affect blunted. Natalie [name] reports that her mood is improved. She reports '' I'm doing fine. I'm supposed to go home today I think. '' She denies any SI/HI/A/V Hallucinations. Natalie [name] has been more engaged in the milieu, attending unit programming. She is cooperative with staff. She completed self inventory and reports good sleep, depression at 0/10 on depression scale. She states '' I am prepared to go home. '' A. Medications given as ordered. Support and encouragement provided. Discussed above information with Dr. Cecil Cranker team. R. Patient is safe. Will continue to monitor q 15 minutes for safety.

## 2014-09-14 NOTE — BHH Suicide Risk Assessment (Signed)
B[facility] INPATIENT:  Family/Significant Other Suicide Prevention Education  Suicide Prevention Education:  Education Completed;Natalie [name], mother, [phone]  has been identified by the patient as the family member/significant other with whom the patient will be residing, and identified as the person(s) who will aid the patient in the event of a mental health crisis (suicidal ideations/suicide attempt).  With written consent from the patient, the family member/significant other has been provided the following suicide prevention education, prior to the and/or following the discharge of the patient.  The suicide prevention education provided includes the following:  Suicide risk factors  Suicide prevention and interventions  National Suicide Hotline telephone number  [facility] assessment telephone number  [facility] Emergency Assistance 911  [facility] and/or Residential Mobile Crisis Unit telephone number  Request made of family/significant other to:  Remove weapons (e.g., guns, rifles, knives), all items previously/currently identified as safety concern.    Remove drugs/medications (over-the-counter, prescriptions, illicit drugs), all items previously/currently identified as a safety concern.  The family member/significant other verbalizes understanding of the suicide prevention education information provided.  The family member/significant other agrees to remove the items of safety concern listed above.  [name] mother verbalized understanding of material that has been previously presented to her during [name] prior admissions, and asked that I not go over it in detail again.    Daryel Gerald B 09/14/2014, 10:06 AM

## 2014-09-14 NOTE — BHH Group Notes (Signed)
[facility] LCSW Aftercare Discharge Planning Group Note   09/14/2014 9:23 AM  Participation Quality:  Active   Mood/Affect:  Flat  Depression Rating:  0  Anxiety Rating:  0  Thoughts of Suicide:  No Will you contract for safety?   NA  Current AVH:  No  Plan for Discharge/Comments:  Pt will be discharged today. She will be going home with her mother and will receive outpatient services.   Transportation Means: Family   Supports: Family  [name],[name]

## 2014-09-18 NOTE — Progress Notes (Signed)
Patient Discharge Instructions:  After Visit Summary (AVS):   Faxed to:  09/18/14 Discharge Summary Note:   Faxed to:  09/18/14 Psychiatric Admission Assessment Note:   Faxed to:  09/18/14 Suicide Risk Assessment - Discharge Assessment:   Faxed to:  09/18/14 Faxed/Sent to the Next Level Care provider:  09/18/14 Faxed to [facility] @ [phone] Faxed to [facility] @ [phone]  Jerelene Redden, 09/18/2014, 2:53 PM

## 2014-10-09 ENCOUNTER — Encounter (HOSPITAL_COMMUNITY): Payer: Self-pay | Admitting: Emergency Medicine

## 2014-10-09 ENCOUNTER — Emergency Department (HOSPITAL_COMMUNITY)
Admission: EM | Admit: 2014-10-09 | Discharge: 2014-10-10 | Disposition: A | Discharge status: Home / Self Care | Payer: Medicaid Other | Attending: Emergency Medicine | Admitting: Emergency Medicine

## 2014-10-09 DIAGNOSIS — F200 Paranoid schizophrenia: Secondary | ICD-10-CM | POA: Diagnosis not present

## 2014-10-09 DIAGNOSIS — Z5900 Homelessness unspecified: Secondary | ICD-10-CM

## 2014-10-09 DIAGNOSIS — F319 Bipolar disorder, unspecified: Secondary | ICD-10-CM | POA: Insufficient documentation

## 2014-10-09 DIAGNOSIS — F419 Anxiety disorder, unspecified: Secondary | ICD-10-CM | POA: Insufficient documentation

## 2014-10-09 DIAGNOSIS — F42 Obsessive-compulsive disorder: Secondary | ICD-10-CM | POA: Insufficient documentation

## 2014-10-09 DIAGNOSIS — R008 Other abnormalities of heart beat: Secondary | ICD-10-CM | POA: Diagnosis present

## 2014-10-09 DIAGNOSIS — Z79899 Other long term (current) drug therapy: Secondary | ICD-10-CM | POA: Diagnosis not present

## 2014-10-09 DIAGNOSIS — F459 Somatoform disorder, unspecified: Secondary | ICD-10-CM | POA: Diagnosis not present

## 2014-10-09 DIAGNOSIS — F99 Mental disorder, not otherwise specified: Secondary | ICD-10-CM

## 2014-10-09 LAB — CBC WITH DIFFERENTIAL/PLATELET
BASOS ABS: 0 10*3/uL (ref 0.0–0.1)
Basophils Relative: 0 % (ref 0–1)
EOS PCT: 1 % (ref 0–5)
Eosinophils Absolute: 0.1 10*3/uL (ref 0.0–0.7)
HCT: 35.2 % — ABNORMAL LOW (ref 36.0–46.0)
Hemoglobin: 11.9 g/dL — ABNORMAL LOW (ref 12.0–15.0)
Lymphocytes Relative: 30 % (ref 12–46)
Lymphs Abs: 2.3 10*3/uL (ref 0.7–4.0)
MCH: 33 pg (ref 26.0–34.0)
MCHC: 33.8 g/dL (ref 30.0–36.0)
MCV: 97.5 fL (ref 78.0–100.0)
Monocytes Absolute: 0.3 10*3/uL (ref 0.1–1.0)
Monocytes Relative: 4 % (ref 3–12)
Neutro Abs: 4.9 10*3/uL (ref 1.7–7.7)
Neutrophils Relative %: 65 % (ref 43–77)
Platelets: 191 10*3/uL (ref 150–400)
RBC: 3.61 MIL/uL — ABNORMAL LOW (ref 3.87–5.11)
RDW: 13.4 % (ref 11.5–15.5)
WBC: 7.6 10*3/uL (ref 4.0–10.5)

## 2014-10-09 LAB — COMPREHENSIVE METABOLIC PANEL
ALK PHOS: 81 U/L (ref 39–117)
ALT: 11 U/L (ref 0–35)
AST: 14 U/L (ref 0–37)
Albumin: 4.4 g/dL (ref 3.5–5.2)
Anion gap: 17 — ABNORMAL HIGH (ref 5–15)
BUN: 11 mg/dL (ref 6–23)
CO2: 22 mEq/L (ref 19–32)
Calcium: 9.7 mg/dL (ref 8.4–10.5)
Chloride: 103 mEq/L (ref 96–112)
Creatinine, Ser: 0.6 mg/dL (ref 0.50–1.10)
GFR calc Af Amer: >90 mL/min (ref >90)
GFR calc non Af Amer: >90 mL/min (ref >90)
GLUCOSE: 81 mg/dL (ref 70–99)
POTASSIUM: 3.7 meq/L (ref 3.7–5.3)
SODIUM: 142 meq/L (ref 137–147)
Total Bilirubin: 0.5 mg/dL (ref 0.3–1.2)
Total Protein: 8 g/dL (ref 6.0–8.3)

## 2014-10-09 LAB — ACETAMINOPHEN LEVEL: Acetaminophen (Tylenol), Serum: <15 ug/mL (ref 10–30)

## 2014-10-09 LAB — SALICYLATE LEVEL: Salicylate Lvl: <2 mg/dL — ABNORMAL LOW (ref 2.8–20.0)

## 2014-10-09 LAB — ETHANOL: Alcohol, Ethyl (B): <11 mg/dL (ref 0–11)

## 2014-10-09 NOTE — ED Notes (Signed)
Pt presents tearful; pt states she needs help but denies HI/SI; pt refused to speak about husband states she does not know what is going on with them; Pt states she is paranoid to go to the bathroom but does not know why; Pt states sometime she does not remember how she is going to use the bathroom; Pt is slow to react. Pt states she needs to go to the bathroom but paranoid; Pt is just sitting on edge of bed not moving with head down; Pt making no attempt to get dressed in scrubs; PA at bedside

## 2014-10-09 NOTE — BH Assessment (Signed)
Reviewed notes prior to initiating assessment. UDS pending.  Requested cart be placed with pt for assessment.   Assessment to commence shortly.    Clista Bernhardt, [facility] Triage Specialist 10/09/2014 11:34 PM

## 2014-10-09 NOTE — ED Provider Notes (Signed)
CSN: [id number]     Arrival date & time 10/09/14  1938 History   First MD Initiated Contact with Patient 10/09/14 2109     Chief Complaint  Patient presents with  . Medical Clearance    The patient was brought here by the husband due to mental illness.  According to [employer], he took her to the magistrate's office in an attempt to get IVC papers and they denied him.     (Consider location/radiation/quality/duration/timing/severity/associated sxs/prior Treatment) HPI Comments: The patient is a 23 year old female past no history of anxiety, depression, bipolar disorder, OCD presents emergency room chief complaint of medical clearance. Patient reports she is scared to learn what happened to her in the past because "she knows it's bad", patient is unable to elaborate on this statement. Patient reports history of "not acting correctly" but is unsure what caused this.  Patient reports she lived with her "mother-in-law and her mother-in-law's son" up until today. She states her family is unhappy with her because of her behavior. She reports non-compliance with her medication today. Patient denies SI, HI. Patient denies harm to self or others. Denies EtOH, IV drug use or other misuse of prescription medication. Patient reports multiple hospitalizations for similar symptoms and is unsure which medications she is on at this time.   The history is provided by the patient. No language interpreter was used.    Past Medical History  Diagnosis Date  . Psychiatric diagnosis   . Chronic bipolar disorder   . OCD (obsessive compulsive disorder)   . H/O suicide attempt 05-2013    OD  . Anxiety   . Depression    History reviewed. No pertinent past surgical history. History reviewed. No pertinent family history. History  Substance Use Topics  . Smoking status: Never Smoker   . Smokeless tobacco: Not on file  . Alcohol Use: No   OB History    No data available     Review of Systems  Constitutional:  Negative for fever and chills.  Eyes: Negative for photophobia and visual disturbance.  Respiratory: Negative for cough.   Cardiovascular: Negative for chest pain.  Gastrointestinal: Negative for nausea, vomiting and abdominal pain.  Neurological: Negative for seizures, weakness and headaches.  Psychiatric/Behavioral: Negative for suicidal ideas, hallucinations and self-injury.      Allergies  Review of patient's allergies indicates no known allergies.  Home Medications   Prior to Admission medications   Medication Sig Start Date End Date Taking? Authorizing Provider  benztropine (COGENTIN) 0.5 MG tablet Take 1 tablet (0.5 mg total) by mouth 2 (two) times daily. For involuntary movements brought on by medication 09/14/14   [facility], NP  carbamazepine (TEGRETOL XR) 100 MG 12 hr tablet Take 3 tablets (300 mg total) by mouth 2 (two) times daily. For mood stabilization 09/14/14   [facility], NP  hydrOXYzine (ATARAX/VISTARIL) 25 MG tablet Take 1 tablet (25 mg total) by mouth every 6 (six) hours as needed for anxiety. For anxiety 09/14/14   Lindwood Qua, NP  lamoTRIgine (LAMICTAL) 100 MG tablet Take 1 tablet (100 mg total) by mouth 2 (two) times daily. 09/14/14   Lindwood Qua, NP  traZODone (DESYREL) 100 MG tablet Take 2 tablets (200 mg total) by mouth at bedtime. For sleeplessness and depression 09/14/14   Lindwood Qua, NP  ziprasidone (GEODON) 80 MG capsule Take 1 capsule (80 mg total) by mouth 2 (two) times daily with a meal. For mood stabilization 09/14/14   Velna Hatchet  May Agustin, NP   BP 112/74 mmHg  Pulse 89  Temp(Src) 98.8 F (37.1 C) (Oral)  Resp 20  SpO2 100% Physical Exam  Constitutional: She is oriented to person, place, and time. She appears well-developed and well-nourished. No distress.  HENT:  Head: Normocephalic and atraumatic.  Eyes: EOM are normal.  Neck: Neck supple.  Cardiovascular: Normal rate and regular rhythm.   Pulmonary/Chest:  Effort normal and breath sounds normal. She has no wheezes. She has no rales.  Abdominal: Soft. There is no tenderness. There is no rebound and no guarding.  Musculoskeletal: Normal range of motion.  Neurological: She is alert and oriented to person, place, and time. She has normal strength. She displays no tremor. GCS eye subscore is 4. GCS verbal subscore is 5. GCS motor subscore is 6.  Oriented to person, place, month and year. Speech is clear and goal oriented, follows commands VIII-normal.   Motor: Strength 5/5 to upper and lower extremities bilaterally. Moves all 4 extremities equally. Sensory: normal sensation to upper and lower extremities.   Skin: Skin is warm and dry.  Psychiatric: Her behavior is normal. Her affect is blunt. Thought content is paranoid. She expresses no homicidal and no suicidal ideation. She expresses no suicidal plans and no homicidal plans.  Flat affect.  Nursing note and vitals reviewed.   ED Course  Procedures (including critical care time) Labs Review Labs Reviewed  COMPREHENSIVE METABOLIC PANEL - Abnormal; Notable for the following:    Anion gap 17 (*)    All other components within normal limits  SALICYLATE LEVEL - Abnormal; Notable for the following:    Salicylate Lvl <2.0 (*)    All other components within normal limits  CBC WITH DIFFERENTIAL - Abnormal; Notable for the following:    RBC 3.61 (*)    Hemoglobin 11.9 (*)    HCT 35.2 (*)    All other components within normal limits  ACETAMINOPHEN LEVEL  ETHANOL  URINE RAPID DRUG SCREEN (HOSP PERFORMED)    Imaging Review No results found.   EKG Interpretation None      MDM   Final diagnoses:  Psychiatric problem   Pt here with for medical clearance, Voluntary.  Pt denies SI/HI but complains of medication non-compliance and is paranoid of her history of what happened to her.  Denies drug, EtOH use. No other somatic complaints, patient is medically clear. TTS- psych to evaluate in the  morning. Home medications ordered.  Mellody Drown, PA-C 10/10/14 0101  Gerhard Munch, MD 10/10/14 [phone]

## 2014-10-09 NOTE — ED Notes (Signed)
Telepsy in process 

## 2014-10-09 NOTE — ED Notes (Signed)
RN able to coach pt back to room for telepsy; machine set up in room

## 2014-10-09 NOTE — ED Notes (Signed)
The patient was brought here by the husband due to mental illness.  According to [employer], he took her to the magistrate's office in an attempt to get IVC papers and they denied him.  The husband brought her here to get her help for the mental illness.  [employer] says the patient is homeless and the husband is tired of dealing with her medical problems.  He says she refuses to take her medication and he feels like she needs help.  In speaking with the patient she denies any SI and HI.  She is tearful, has a flat affect and keeps saying she doesn't know where her family is.  She is also saying the man that brought her is her husband but she does not know if they are married anymore.  [employer] is attempting to contact her parents.

## 2014-10-09 NOTE — ED Notes (Signed)
Pt standing at bathroom door for more than 15 mins; Sitter,[employer], RN all attempted to convince pt to go to the bathroom and to change into scrubs; Pt is non responsive to staff questions at this time; pt calm.

## 2014-10-10 DIAGNOSIS — F3130 Bipolar disorder, current episode depressed, mild or moderate severity, unspecified: Secondary | ICD-10-CM

## 2014-10-10 MED ORDER — TRAZODONE HCL 100 MG PO TABS
200.0000 mg | ORAL_TABLET | Freq: Every day | ORAL | Status: DC
  Filled 2014-10-10: qty 2

## 2014-10-10 MED ORDER — LAMOTRIGINE 100 MG PO TABS
100.0000 mg | ORAL_TABLET | Freq: Two times a day (BID) | ORAL | Status: DC
  Filled 2014-10-10 (×2): qty 1

## 2014-10-10 MED ORDER — BENZTROPINE MESYLATE 1 MG PO TABS
0.5000 mg | ORAL_TABLET | Freq: Two times a day (BID) | ORAL | Status: DC
Start: 2014-10-10 — End: 2014-10-10
  Filled 2014-10-10 (×2): qty 1

## 2014-10-10 MED ORDER — HYDROXYZINE HCL 25 MG PO TABS
25.0000 mg | ORAL_TABLET | Freq: Four times a day (QID) | ORAL | Status: DC | PRN
Start: 2014-10-10 — End: 2014-10-10
  Filled 2014-10-10: qty 1

## 2014-10-10 MED ORDER — ZIPRASIDONE HCL 20 MG PO CAPS
80.0000 mg | ORAL_CAPSULE | Freq: Two times a day (BID) | ORAL | Status: DC
  Filled 2014-10-10 (×2): qty 4

## 2014-10-10 MED ORDER — CARBAMAZEPINE ER 200 MG PO TB12
300.0000 mg | ORAL_TABLET | Freq: Two times a day (BID) | ORAL | Status: DC
  Filled 2014-10-10 (×3): qty 1

## 2014-10-10 NOTE — ED Notes (Addendum)
Pt continues to refuse taking her medications. RN in room more than 20 minutes trying to give pt her medication. Pt also continues to refuse to give a urine sample and change into paper scrubs.

## 2014-10-10 NOTE — ED Provider Notes (Signed)
Pt has been evaluated by Psychiatry Dr. Shela Commons: states pt does not meet inpt criteria at this time and can be discharged after Social Worker has evaluated pt.  Samuel Jester, DO 10/10/14 1340

## 2014-10-10 NOTE — ED Notes (Signed)
SOCIAL WORKER HERE TO GIVE PT HOMELESS RESOURCES. DR [facility] IS GOING TO READY PT DISCHARGE PAPERS

## 2014-10-10 NOTE — ED Notes (Signed)
PT ESCORTED OUT OF ED BY [employer], SHE HAS HER REFERRALS, BUS PASS AND BELONGINGS AND VALUABLES

## 2014-10-10 NOTE — Discharge Instructions (Signed)
°Emergency Department Resource Guide °1) Find a Doctor and Pay Out of Pocket °Although you won't have to find out who is covered by your insurance plan, it is a good idea to ask around and get recommendations. You will then need to call the office and see if the doctor you have chosen will accept you as a new patient and what types of options they offer for patients who are self-pay. Some doctors offer discounts or will set up payment plans for their patients who do not have insurance, but you will need to ask so you aren't surprised when you get to your appointment. ° °2) Contact Your Local Health Department °Not all health departments have doctors that can see patients for sick visits, but many do, so it is worth a call to see if yours does. If you don't know where your local health department is, you can check in your phone book. The CDC also has a tool to help you locate your state's health department, and many state websites also have listings of all of their local health departments. ° °3) Find a Walk-in Clinic °If your illness is not likely to be very severe or complicated, you may want to try a walk in clinic. These are popping up all over the country in pharmacies, drugstores, and shopping centers. They're usually staffed by nurse practitioners or physician assistants that have been trained to treat common illnesses and complaints. They're usually fairly quick and inexpensive. However, if you have serious medical issues or chronic medical problems, these are probably not your best option. ° °No Primary Care Doctor: °- Call [facility] at  832-8000 - they can help you locate a primary care doctor that  accepts your insurance, provides certain services, etc. °- Physician Referral Service- 1-800-533-3463 ° °Chronic Pain Problems: °Organization         Address  Phone   Notes  °[facility]  (336) 297-2271 Patients need to be referred by their primary care doctor.  ° °Medication  Assistance: °Organization         Address  Phone   Notes  °[place] [facility] [address] °[place], [place] [address] (336) 641-8030 --Must be a resident of [place] °-- Must have NO insurance coverage whatsoever (no Medicaid/ Medicare, etc.) °-- The pt. MUST have a primary care doctor that directs their care regularly and follows them in the community °  °[facility]  (866) 331-1348   °[employer]  (888) 892-1162   ° °Agencies that provide inexpensive medical care: °Organization         Address  Phone   Notes  °[facility]  (336) 832-8035   °[facility]    (336) 832-7272   °[facility] [address] °[place], [place] [address] (336) 832-4777   °[facility] [address], °[place] (336) 271-4999   °[facility]    (336) 373-0678   °[facility]    (336) 272-1050   °[facility] ° [address], [place] Phone:  (336) 832-4444, Fax:  (336) 832-4440 Hours of Operation:  9 am - 6 pm, M-F.  Also accepts Medicaid/Medicare and self-pay.  °[facility] ° [address], [place] Phone: (336) 832-3150, Fax: (336) [phone]. Hours of Operation:  8:30 am - 5:30 pm, M-F.  Also accepts Medicaid and self-pay.  °[facility] [place] [address], [place] Phone: (336) 878-6027   °[facility] [address], [place], [place] (336)723-1848, Ext. 123 Mondays & Thursdays: 7-9 AM.  First 15 patients are seen on a first come, first serve basis. °  ° °Medicaid-accepting [place] Providers: ° °Organization         Address  Phone   Notes  °[facility] [address], [place] (336) 641-2100 Also accepts self-pay patients.  °[facility] [address], [place] ° (336) 856-9996   °[facility] [address], [place]  (336) 288-8857   °[facility] [address] [place] [address], [place] (336) 299-7000   °Veita Bland [address], [place]  ° (336) 373-1557 Only accepts [place] Access Medicaid patients after they have their name applied to their card.  ° °Self-Pay (no insurance) in [place]: ° °Organization         Address  Phone   Notes  °Sickle Cell Patients, [facility] [address], [place] (336) 832-1970   °[facility] [address], [place] (336) 832-4400   °[facility] ° [address], [place] (336) 992-4800   °[facility]/Dr. Osei-Bonsu ° [address] [place] [address], [place] or [address], [place] (336) 841-8500 Phone number for both [place] and [place] locations is the same.  °[facility] [address], [place] (336) 299-0000   °[facility] [address] [place] [address], [place] or [address] (336) 852-7530 °(336) 878-2260   °[facility] [address], [place] (336) 350-1642, phone; (336) 294-5005, fax Sees patients 1st and 3rd Saturday of every month.  Must not qualify for public or private insurance (i.e. Medicaid, Medicare, [place] Health Choice, Veterans' Benefits) • Household income should be no more than 200% of the poverty level •The clinic cannot treat you if you are pregnant or think you are pregnant • Sexually transmitted diseases are not treated at the clinic.  ° ° °Dental Care: °Organization         Address  Phone  Notes  °[place] [facility] [address], [place] (336) 641-6152 Accepts children up to age 21 who are enrolled in Medicaid or [place] Health Choice; pregnant women with a Medicaid card; and children who have applied for Medicaid or [place] Health Choice, but were declined, whose parents can pay a reduced fee at time of service.  °[place]  [facility] [place]  [address], [place] (336) 641-7733 Accepts children up to age 21 who are enrolled in Medicaid or [name] Health Choice; pregnant women with a Medicaid card; and children who have applied for Medicaid or [place] Health Choice, but were declined, whose parents can pay a reduced fee at time of service.  °[facility] ° [address], [place] (336) 641-4533 Patients are seen by appointment only. Walk-ins are not accepted. [facility] will see patients 18 years of age and older. °Monday - Tuesday (8am-5pm) °Most Wednesdays (8:30-5pm) °$30 per visit, cash only  °[facility] ° [address], [place] (336) 641-4533 Patients are seen by appointment only. Walk-ins are not accepted. [facility] will see patients 18 years of age and older. °One   Wednesday Evening (Monthly: Volunteer Based).  $30 per visit, cash only  °[facility]  (919) 537-3737 for adults; Children under age 4, call Graduate Pediatric Dentistry at (919) [phone]. Children aged 4-14, please call (919) [phone] to request a pediatric application. ° Dental services are provided in all areas of dental care including fillings, crowns and bridges, complete and partial dentures, implants, gum treatment, root canals, and extractions. Preventive care is also provided. Treatment is provided to both adults and children. °Patients are selected via a lottery and there is often a waiting list. °  °[facility] [address], °[place] ° (336) 763-8833 [email or web address] °  °[facility] [address], [place], [place] (336)723-1848, Ext. 123 Second and Fourth Thursday of each month, opens at 6:30 AM; Clinic ends at 9 AM.  Patients are seen on a first-come first-served basis, and a limited number are seen during each clinic.  ° °[facility] ° [address], [place], [place] (336) 723-7904    Eligibility Requirements °You must have lived in [place], [place], or [place] counties for at least the last three months. °  You cannot be eligible for state or federal sponsored healthcare insurance, including Veterans Administration, Medicaid, or Medicare. °  You generally cannot be eligible for healthcare insurance through your employer.  °  How to apply: °Eligibility screenings are held every Tuesday and Wednesday afternoon from 1:00 pm until 4:00 pm. You do not need an appointment for the interview!  °[facility] [address], [place], [place] 336-631-2330   °[facility]  336-342-8273   °[facility]  336-703-3100   °[facility]  336-570-6415   ° °Behavioral Health Resources in the Community: °Intensive Outpatient Programs °Organization         Address  Phone  Notes  °[facility] [address], [place], [place] 336-878-6098   °[facility] [address], [place], [place] 336-832-9800   °[facility] [address], [place], [place] ° 336-882-2125   °[facility] [address],  °[place], [place] 1-800-853-5163 or 336-641-4981   °Substance Abuse Resources °Organization         Address  Phone  Notes  °[facility]  336-882-2125   °[facility]  336-784-9470   °[facility]  336-285-9073   °[name]  336-845-3988   °Residential & Outpatient Substance Abuse Program  1-800-659-3381   °Psychological Services °Organization         Address  Phone  Notes  °[facility]  336- 832-9600   °[facility]  336- 378-7881   °[facility] [address], [place] 1-800-853-5163 or 336-641-4981   ° °Mobile Crisis Teams °Organization         Address  Phone  Notes  °[facility], Mobile Crisis Care Unit  1-877-626-1772   °Assertive °[facility] ° [address]  [place], [place] 336-834-9664   °Sharon DeEsch [address] °[place] 336-554-5454   ° °Self-Help/Support Groups °Organization         Address  Phone             Notes  °[facility]  - variety of support groups  336- 373-1402 Call for more information  °Narcotics Anonymous (NA), [facility] [address], °[place]  2 meetings at this location  ° °  Residential Treatment Programs Organization         Address  Phone  Notes  [facility] [address]    [place]  1-007-121-9758   [facility]  [address], [place] [address], [place], [place] 826-415-8309   [facility] [address] [place], [place] 407-680-8811 Admissions: 8am-3pm M-F  [facility] [address],    [place], [place] 031-594-5859   [facility] [address] [place], [place], [place] 292-446-2863   [facility] [address],  [place], [place] 817-711-6579   [facility] - Intensive Outpatient [address], [place], [place] 038-333-8329   [facility] ([facility] [address]  [place], [place] 1-916-606-0045 or 548 256 6043   [facility] ([facility] [address], [place], [place] 532-023-3435 Accepts Medicaid  [facility] [address],  [place] 6-861-683-7290 Substance Abuse/Addiction Treatment   [facility]         Address  Phone  Notes  [facility]  [phone]   Angie Fava, PhD [address] [place], [place]   (251)494-4976 or 315-730-4808   [facility]   [address] [place], [place] 608-826-2648   [name] [address], [place], [place] 709-577-1434 Insurance/Medicaid/sponsorship through [facility] and Families [address]                                    [place], [place] 831-133-0224 Therapy/tele-psych/case    [facility] [address], [place] 680-116-7219    Dr. Lolly Mustache  340-762-3826   [facility]  [employer] [facility] Dept. 1) [address], [place] 2) [address], [place] 3)  [address], [place] [phone]  (506)228-3721   [facility] Child Abuse Hotline 505-428-0391 or 705 570 5374 (After Hours)      Take your usual prescriptions as previously directed.  Call your regular mental health provider today to schedule a follow up appointment within the next week.  Return to the Emergency Department immediately sooner if worsening.

## 2014-10-10 NOTE — ED Notes (Addendum)
Pt refuses to sign belongings inventory, valuables list, and to provide d/c signature. Pt also refuses to get dressed and leave ED. Security notified.

## 2014-10-10 NOTE — ED Notes (Signed)
Patient husband has called. Pt made aware and given his phone number, she came to desk but did not call him

## 2014-10-10 NOTE — ED Notes (Signed)
Dr jonnalagada at bedside 
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
## 2014-10-10 NOTE — ED Notes (Signed)
Security at bedside

## 2014-10-10 NOTE — Consult Note (Signed)
[facility] Face-to-Face Psychiatry Consult   Reason for Consult:  Bipolar disorder and non compliance with medications Referring Physician:  EDP ISSABELLA [name] is an 23 y.o. female. Total Time spent with patient: 30 minutes  Assessment: AXIS I:  Bipolar, Depressed AXIS II:  Deferred AXIS III:   Past Medical History  Diagnosis Date  . Psychiatric diagnosis   . Chronic bipolar disorder   . OCD (obsessive compulsive disorder)   . H/O suicide attempt 05-2013    OD  . Anxiety   . Depression    AXIS IV:  economic problems, housing problems, other psychosocial or environmental problems, problems related to social environment and problems with primary support group AXIS V:  51-60 moderate symptoms  Plan: Case discussed with staff RN, EDP, case manager and Otila Kluver, [facility] No medication changes made during this visit Patient does not meet criteria for psychiatric inpatient admission. Supportive therapy provided about ongoing stressors. Refer to out patient psychiatric treatment and needs to be compliant with treatment. Patient will be referred to social services for placement as she is homeless as per the report  Subjective:   Natalie [name] is a 23 y.o. female patient admitted with Bipolar disorder and non compliance with medications.  HPI: Natalie [name] is an 23 y.o. female with history of bipolar disorder, OCD presented to [facility] for the same condition and non compliance with her out patient treatment. Patient stated that she went to her mother home after leaving the [facility] about 3 weeks ago, had an argument and left the home and staying with her spouse. Reportedly her spouse Mr. Reily, has diabetes and has no home. Patient reportedly stated that she has no place to live and not taking her medication and does not know what to do. She has been hospitalized at [facility] for bipolar 5 times in the past 10 months. She denies auditory and visual hallucinations, delusions and paranoia. She denies SA,  denies self harm. She has past hx of suicide attempt via overdosing on her medications. Patient was referred to [facility] for psychosocial rehabilitation and [facility] for medication management which she failed to do so. She has denied current suicidal or homicidal ideation, intention or plans.    HPI Elements:   Location:  bipolar and OCD. Quality:  fair to poor. Severity:  homeless. Timing:  non compliance with medication.  Past Psychiatric History: Past Medical History  Diagnosis Date  . Psychiatric diagnosis   . Chronic bipolar disorder   . OCD (obsessive compulsive disorder)   . H/O suicide attempt 05-2013    OD  . Anxiety   . Depression     reports that she has never smoked. She does not have any smokeless tobacco history on file. She reports that she does not drink alcohol or use illicit drugs. History reviewed. No pertinent family history.         Allergies:  No Known Allergies  ACT Assessment Complete:  Yes:    Educational Status    Risk to Self: Risk to self with the past 6 months Is patient at risk for suicide?: No Substance abuse history and/or treatment for substance abuse?: No  Risk to Others:    Abuse:    Prior Inpatient Therapy:    Prior Outpatient Therapy:    Additional Information:      Objective: Blood pressure 117/77, pulse 93, temperature 97.5 F (36.4 C), temperature source Oral, resp. rate 16, SpO2 100 %.There is no weight on file to calculate BMI. Results  for orders placed or performed during the hospital encounter of 10/09/14 (from the past 72 hour(s))  Acetaminophen level     Status: None   Collection Time: 10/09/14  8:14 PM  Result Value Ref Range   Acetaminophen (Tylenol), Serum <15.0 10 - 30 ug/mL    Comment:        THERAPEUTIC CONCENTRATIONS VARY SIGNIFICANTLY. A RANGE OF 10-30 ug/mL MAY BE AN EFFECTIVE CONCENTRATION FOR MANY PATIENTS. HOWEVER, SOME ARE BEST TREATED AT CONCENTRATIONS OUTSIDE THIS RANGE. ACETAMINOPHEN  CONCENTRATIONS >150 ug/mL AT 4 HOURS AFTER INGESTION AND >50 ug/mL AT 12 HOURS AFTER INGESTION ARE OFTEN ASSOCIATED WITH TOXIC REACTIONS.   Comprehensive metabolic panel     Status: Abnormal   Collection Time: 10/09/14  8:14 PM  Result Value Ref Range   Sodium 142 137 - 147 mEq/L   Potassium 3.7 3.7 - 5.3 mEq/L   Chloride 103 96 - 112 mEq/L   CO2 22 19 - 32 mEq/L   Glucose, Bld 81 70 - 99 mg/dL   BUN 11 6 - 23 mg/dL   Creatinine, Ser 0.60 0.50 - 1.10 mg/dL   Calcium 9.7 8.4 - 10.5 mg/dL   Total Protein 8.0 6.0 - 8.3 g/dL   Albumin 4.4 3.5 - 5.2 g/dL   AST 14 0 - 37 U/L   ALT 11 0 - 35 U/L   Alkaline Phosphatase 81 39 - 117 U/L   Total Bilirubin 0.5 0.3 - 1.2 mg/dL   GFR calc non Af Amer >90 >90 mL/min   GFR calc Af Amer >90 >90 mL/min    Comment: (NOTE) The eGFR has been calculated using the CKD EPI equation. This calculation has not been validated in all clinical situations. eGFR's persistently <90 mL/min signify possible Chronic Kidney Disease.    Anion gap 17 (H) 5 - 15  Ethanol (ETOH)     Status: None   Collection Time: 10/09/14  8:14 PM  Result Value Ref Range   Alcohol, Ethyl (B) <11 0 - 11 mg/dL    Comment:        LOWEST DETECTABLE LIMIT FOR SERUM ALCOHOL IS 11 mg/dL FOR MEDICAL PURPOSES ONLY   Salicylate level     Status: Abnormal   Collection Time: 10/09/14  8:14 PM  Result Value Ref Range   Salicylate Lvl <5.9 (L) 2.8 - 20.0 mg/dL  CBC with Differential     Status: Abnormal   Collection Time: 10/09/14  8:14 PM  Result Value Ref Range   WBC 7.6 4.0 - 10.5 K/uL   RBC 3.61 (L) 3.87 - 5.11 MIL/uL   Hemoglobin 11.9 (L) 12.0 - 15.0 g/dL   HCT 35.2 (L) 36.0 - 46.0 %   MCV 97.5 78.0 - 100.0 fL   MCH 33.0 26.0 - 34.0 pg   MCHC 33.8 30.0 - 36.0 g/dL   RDW 13.4 11.5 - 15.5 %   Platelets 191 150 - 400 K/uL   Neutrophils Relative % 65 43 - 77 %   Neutro Abs 4.9 1.7 - 7.7 K/uL   Lymphocytes Relative 30 12 - 46 %   Lymphs Abs 2.3 0.7 - 4.0 K/uL   Monocytes  Relative 4 3 - 12 %   Monocytes Absolute 0.3 0.1 - 1.0 K/uL   Eosinophils Relative 1 0 - 5 %   Eosinophils Absolute 0.1 0.0 - 0.7 K/uL   Basophils Relative 0 0 - 1 %   Basophils Absolute 0.0 0.0 - 0.1 K/uL   Labs are reviewed .  Current Facility-Administered Medications  Medication Dose Route Frequency Provider Last Rate Last Dose  . benztropine (COGENTIN) tablet 0.5 mg  0.5 mg Oral BID Harvie Heck, PA-C   0.5 mg at 10/10/14 0138  . carbamazepine (TEGRETOL XR) 12 hr tablet 300 mg  300 mg Oral BID Harvie Heck, PA-C   300 mg at 10/10/14 0138  . hydrOXYzine (ATARAX/VISTARIL) tablet 25 mg  25 mg Oral Q6H PRN Harvie Heck, PA-C      . lamoTRIgine (LAMICTAL) tablet 100 mg  100 mg Oral BID Harvie Heck, PA-C   100 mg at 10/10/14 0138  . traZODone (DESYREL) tablet 200 mg  200 mg Oral QHS Harvie Heck, PA-C   200 mg at 10/10/14 0138  . ziprasidone (GEODON) capsule 80 mg  80 mg Oral BID WC Harvie Heck, PA-C   80 mg at 10/10/14 9449   Current Outpatient Prescriptions  Medication Sig Dispense Refill  . benztropine (COGENTIN) 0.5 MG tablet Take 1 tablet (0.5 mg total) by mouth 2 (two) times daily. For involuntary movements brought on by medication 60 tablet 0  . carbamazepine (TEGRETOL XR) 100 MG 12 hr tablet Take 3 tablets (300 mg total) by mouth 2 (two) times daily. For mood stabilization 180 tablet 0  . hydrOXYzine (ATARAX/VISTARIL) 25 MG tablet Take 1 tablet (25 mg total) by mouth every 6 (six) hours as needed for anxiety. For anxiety 30 tablet 0  . lamoTRIgine (LAMICTAL) 100 MG tablet Take 1 tablet (100 mg total) by mouth 2 (two) times daily. 60 tablet 0  . traZODone (DESYREL) 100 MG tablet Take 2 tablets (200 mg total) by mouth at bedtime. For sleeplessness and depression 60 tablet 0  . ziprasidone (GEODON) 80 MG capsule Take 1 capsule (80 mg total) by mouth 2 (two) times daily with a meal. For mood stabilization 60 capsule 0    Psychiatric Specialty Exam: Physical Exam Full physical  performed in Emergency Department. I have reviewed this assessment and concur with its findings.   Review of Systems  Neurological: Positive for weakness.  Psychiatric/Behavioral: Positive for memory loss. The patient is nervous/anxious.      Blood pressure 117/77, pulse 93, temperature 97.5 F (36.4 C), temperature source Oral, resp. rate 16, SpO2 100 %.There is no weight on file to calculate BMI.  General Appearance: Guarded  Eye Contact::  Good  Speech:  Clear and Coherent and Slow  Volume:  Decreased  Mood:  Anxious  Affect:  Depressed and Restricted  Thought Process:  Coherent and Goal Directed  Orientation:  Full (Time, Place, and Person)  Thought Content:  WDL  Suicidal Thoughts:  No  Homicidal Thoughts:  No  Memory:  Immediate;   Fair Recent;   Fair  Judgement:  Impaired  Insight:  Fair  Psychomotor Activity:  Decreased  Concentration:  Fair  Recall:  [employer] of Knowledge:Good  Language: Fair  Akathisia:  NA  Handed:  Right  AIMS (if indicated):     Assets:  Communication Skills Desire for Improvement Intimacy Leisure Time Physical Health Social Support  Sleep:      Musculoskeletal: Strength & Muscle Tone: within normal limits Gait & Station: normal Patient leans: N/A  Treatment Plan Summary: Daily contact with patient to assess and evaluate symptoms and progress in treatment Medication management Refer to social service regarding safe discharge plan as she is homeless Refer to out patient psychiatric treatment at [facility] and [facility] for psychosocial rehab treatment.  Cherl Gorney,JANARDHAHA R. 10/10/2014 3:27 PM

## 2014-10-10 NOTE — ED Notes (Signed)
Security states they will give pt a few minutes to get dressed, otherwise [employer] will be notified to escort pt off hospital property.

## 2014-10-10 NOTE — ED Notes (Signed)
Pt remains sitting up in bed with clothes on; pt still refuses to get in scrubs; pt had questions about meds but when asked directly to explain concerns and/or questions pt gets quit and looks off into space with no response; pt states she believes her meds are being swopped out some how; pt states she is quit b/c she is trying to contain herself; pt is red in face and has an angry look; Meds documented at pt refusal; RN in pt room over 15 mins trying to convince pt to take meds.

## 2014-10-10 NOTE — ED Provider Notes (Signed)
Social Worker has evaluated pt: homeless shelter resources given. Pt ready for d/c.   Samuel Jester, DO 10/10/14 1449
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
## 2014-10-10 NOTE — ED Notes (Signed)
Pt changed into purple scrubs after shower. [name] belongings collected, labeled, and placed behind nurses station.

## 2014-10-10 NOTE — ED Notes (Signed)
[employer] Lt. Terry paged.

## 2014-10-20 ENCOUNTER — Emergency Department (HOSPITAL_COMMUNITY)
Admission: EM | Admit: 2014-10-20 | Discharge: 2014-10-20 | Discharge status: Against Medical Advice | Payer: Medicaid Other | Source: Home / Self Care | Attending: Emergency Medicine | Admitting: Emergency Medicine

## 2014-10-20 ENCOUNTER — Emergency Department (HOSPITAL_COMMUNITY)
Admission: EM | Admit: 2014-10-20 | Discharge: 2014-10-24 | Disposition: A | Discharge status: Home / Self Care | Payer: Medicaid Other | Attending: Emergency Medicine | Admitting: Emergency Medicine

## 2014-10-20 ENCOUNTER — Encounter (HOSPITAL_COMMUNITY): Payer: Self-pay | Admitting: Emergency Medicine

## 2014-10-20 DIAGNOSIS — Z79899 Other long term (current) drug therapy: Secondary | ICD-10-CM | POA: Insufficient documentation

## 2014-10-20 DIAGNOSIS — F419 Anxiety disorder, unspecified: Secondary | ICD-10-CM

## 2014-10-20 DIAGNOSIS — Z9119 Patient's noncompliance with other medical treatment and regimen: Secondary | ICD-10-CM | POA: Insufficient documentation

## 2014-10-20 DIAGNOSIS — F315 Bipolar disorder, current episode depressed, severe, with psychotic features: Secondary | ICD-10-CM | POA: Diagnosis not present

## 2014-10-20 DIAGNOSIS — Z3202 Encounter for pregnancy test, result negative: Secondary | ICD-10-CM | POA: Diagnosis not present

## 2014-10-20 DIAGNOSIS — F319 Bipolar disorder, unspecified: Secondary | ICD-10-CM

## 2014-10-20 DIAGNOSIS — F42 Obsessive-compulsive disorder: Secondary | ICD-10-CM | POA: Insufficient documentation

## 2014-10-20 DIAGNOSIS — F3130 Bipolar disorder, current episode depressed, mild or moderate severity, unspecified: Secondary | ICD-10-CM | POA: Diagnosis present

## 2014-10-20 DIAGNOSIS — Z9114 Patient's other noncompliance with medication regimen: Secondary | ICD-10-CM

## 2014-10-20 DIAGNOSIS — Z046 Encounter for general psychiatric examination, requested by authority: Secondary | ICD-10-CM | POA: Diagnosis present

## 2014-10-20 LAB — COMPREHENSIVE METABOLIC PANEL
ALBUMIN: 5 g/dL (ref 3.5–5.2)
ALT: 19 U/L (ref 0–35)
AST: 21 U/L (ref 0–37)
Alkaline Phosphatase: 102 U/L (ref 39–117)
Anion gap: 17 — ABNORMAL HIGH (ref 5–15)
BILIRUBIN TOTAL: 0.5 mg/dL (ref 0.3–1.2)
BUN: 10 mg/dL (ref 6–23)
CALCIUM: 10.4 mg/dL (ref 8.4–10.5)
CO2: 24 mEq/L (ref 19–32)
CREATININE: 0.7 mg/dL (ref 0.50–1.10)
Chloride: 96 mEq/L (ref 96–112)
GFR calc Af Amer: >90 mL/min (ref >90)
GFR calc non Af Amer: >90 mL/min (ref >90)
Glucose, Bld: 77 mg/dL (ref 70–99)
Potassium: 3.8 mEq/L (ref 3.7–5.3)
Sodium: 137 mEq/L (ref 137–147)
TOTAL PROTEIN: 9.1 g/dL — AB (ref 6.0–8.3)

## 2014-10-20 LAB — CBC WITH DIFFERENTIAL/PLATELET
BASOS ABS: 0.1 10*3/uL (ref 0.0–0.1)
BASOS PCT: 1 % (ref 0–1)
EOS ABS: 0.1 10*3/uL (ref 0.0–0.7)
Eosinophils Relative: 1 % (ref 0–5)
HCT: 39.4 % (ref 36.0–46.0)
Hemoglobin: 13.4 g/dL (ref 12.0–15.0)
Lymphocytes Relative: 25 % (ref 12–46)
Lymphs Abs: 2.3 10*3/uL (ref 0.7–4.0)
MCH: 32.8 pg (ref 26.0–34.0)
MCHC: 34 g/dL (ref 30.0–36.0)
MCV: 96.6 fL (ref 78.0–100.0)
MONO ABS: 0.5 10*3/uL (ref 0.1–1.0)
Monocytes Relative: 5 % (ref 3–12)
NEUTROS ABS: 6.3 10*3/uL (ref 1.7–7.7)
Neutrophils Relative %: 68 % (ref 43–77)
Platelets: 228 10*3/uL (ref 150–400)
RBC: 4.08 MIL/uL (ref 3.87–5.11)
RDW: 12.8 % (ref 11.5–15.5)
WBC: 9.2 10*3/uL (ref 4.0–10.5)

## 2014-10-20 LAB — RAPID URINE DRUG SCREEN, HOSP PERFORMED
Amphetamines: NOT DETECTED
Barbiturates: NOT DETECTED
Benzodiazepines: NOT DETECTED
Cocaine: NOT DETECTED
OPIATES: NOT DETECTED
Tetrahydrocannabinol: NOT DETECTED

## 2014-10-20 LAB — PREGNANCY, URINE: Preg Test, Ur: NEGATIVE

## 2014-10-20 LAB — SALICYLATE LEVEL: Salicylate Lvl: <2 mg/dL — ABNORMAL LOW (ref 2.8–20.0)

## 2014-10-20 LAB — CARBAMAZEPINE LEVEL, TOTAL: CARBAMAZEPINE LVL: 3.7 ug/mL — AB (ref 4.0–12.0)

## 2014-10-20 LAB — ETHANOL

## 2014-10-20 LAB — ACETAMINOPHEN LEVEL

## 2014-10-20 MED ORDER — IBUPROFEN 200 MG PO TABS: 600.0000 mg | ORAL_TABLET | Freq: Three times a day (TID) | ORAL | Status: DC | PRN

## 2014-10-20 MED ORDER — ALUM & MAG HYDROXIDE-SIMETH 200-200-20 MG/5ML PO SUSP: 30.0000 mL | ORAL | Status: DC | PRN

## 2014-10-20 MED ORDER — LORAZEPAM 1 MG PO TABS
1.0000 mg | ORAL_TABLET | Freq: Three times a day (TID) | ORAL | Status: DC | PRN
  Administered 2014-10-22: 1 mg via ORAL
  Filled 2014-10-20 (×2): qty 1

## 2014-10-20 MED ORDER — ZIPRASIDONE HCL 20 MG PO CAPS
80.0000 mg | ORAL_CAPSULE | Freq: Two times a day (BID) | ORAL | Status: DC
  Filled 2014-10-20 (×4): qty 4

## 2014-10-20 MED ORDER — ACETAMINOPHEN 325 MG PO TABS: 650.0000 mg | ORAL_TABLET | ORAL | Status: DC | PRN

## 2014-10-20 MED ORDER — BENZTROPINE MESYLATE 1 MG PO TABS
0.5000 mg | ORAL_TABLET | Freq: Two times a day (BID) | ORAL | Status: DC
  Administered 2014-10-21 – 2014-10-23 (×4): 0.5 mg via ORAL
  Filled 2014-10-20 (×6): qty 1

## 2014-10-20 MED ORDER — ONDANSETRON HCL 4 MG PO TABS: 4.0000 mg | ORAL_TABLET | Freq: Three times a day (TID) | ORAL | Status: DC | PRN
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
## 2014-10-20 NOTE — ED Provider Notes (Signed)
CSN: [id number]     Arrival date & time 10/20/14  0445 History   First MD Initiated Contact with Patient 10/20/14 0449     Chief Complaint  Patient presents with  . IVC      (Consider location/radiation/quality/duration/timing/severity/associated sxs/prior Treatment) HPI The patient is brought in by the police with IVC paperwork. She was found in the median of [employer]. Police report is that the patient would not leave that area of the highway and was a significant danger to herself. The patient reports that she doesn't belong here. Review of medical record however indicates that the patient had been evaluated just prior to this in the emergency department at [facility] and at that point the plan was for outpatient psychiatric treatment. At this time however the patient appears to be decompensated and unable to care for herself with significant risk of self injury due to lack of insight . Past Medical History  Diagnosis Date  . Psychiatric diagnosis   . Chronic bipolar disorder   . OCD (obsessive compulsive disorder)   . H/O suicide attempt 05-2013    OD  . Anxiety   . Depression    History reviewed. No pertinent past surgical history. History reviewed. No pertinent family history. History  Substance Use Topics  . Smoking status: Never Smoker   . Smokeless tobacco: Not on file  . Alcohol Use: No   OB History    No data available     Review of Systems The patient is very limited in her verbal responses. She does not endorse any recent illness.   Allergies  Review of patient's allergies indicates no known allergies.  Home Medications   Prior to Admission medications   Medication Sig Start Date End Date Taking? Authorizing Provider  benztropine (COGENTIN) 0.5 MG tablet Take 1 tablet (0.5 mg total) by mouth 2 (two) times daily. For involuntary movements brought on by medication 09/14/14  Yes [facility], NP  carbamazepine (TEGRETOL XR) 100 MG 12 hr tablet  Take 3 tablets (300 mg total) by mouth 2 (two) times daily. For mood stabilization 09/14/14  Yes Velna Hatchet May Agustin, NP  hydrOXYzine (ATARAX/VISTARIL) 25 MG tablet Take 1 tablet (25 mg total) by mouth every 6 (six) hours as needed for anxiety. For anxiety 09/14/14  Yes Velna Hatchet May Agustin, NP  lamoTRIgine (LAMICTAL) 100 MG tablet Take 1 tablet (100 mg total) by mouth 2 (two) times daily. 09/14/14  Yes Velna Hatchet May Agustin, NP  traZODone (DESYREL) 100 MG tablet Take 2 tablets (200 mg total) by mouth at bedtime. For sleeplessness and depression 09/14/14  Yes Velna Hatchet May Agustin, NP  ziprasidone (GEODON) 80 MG capsule Take 1 capsule (80 mg total) by mouth 2 (two) times daily with a meal. For mood stabilization 09/14/14  Yes Velna Hatchet May Agustin, NP   BP 138/85 mmHg  Pulse 79  Temp(Src) 98.2 F (36.8 C) (Oral)  Resp 16  SpO2 98% Physical Exam  Constitutional:  The patient is standing and ambulating. She is nontoxic in appearance. She has no respiratory distress.  HENT:  Head: Normocephalic and atraumatic.  Nose: Nose normal.  Mouth/Throat: Oropharynx is clear and moist. No oropharyngeal exudate.  Eyes: Conjunctivae and EOM are normal. Pupils are equal, round, and reactive to light. No scleral icterus.  Neck: Normal range of motion. Neck supple. No thyromegaly present.  Cardiovascular: Normal rate, regular rhythm, normal heart sounds and intact distal pulses.  Exam reveals no friction rub.   No murmur heard. Pulmonary/Chest: Effort  normal and breath sounds normal. No respiratory distress. She has no wheezes. She has no rales.  Abdominal: Soft. Bowel sounds are normal. She exhibits no distension and no mass. There is no tenderness. There is no rebound and no guarding.  Musculoskeletal: Normal range of motion. She exhibits no edema or tenderness.  Neurological: She is alert. No cranial nerve deficit. Coordination normal.  The patient is alert. She is ambulatory is a coordinated gait.  Skin: Skin is  warm and dry.  Psychiatric:  The patient's affect is flat. She will have direct eye contact but not respond verbally. When she responds is very limited in stating that "I don't belong here". The patient however will not have a discussion concerning her thoughts and the current situation. Other times she will not maintain eye contact and will stand staring and listing but not interacting.    ED Course  Procedures (including critical care time) Labs Review Labs Reviewed  URINE CULTURE  CBC WITH DIFFERENTIAL  URINE RAPID DRUG SCREEN (HOSP PERFORMED)  PREGNANCY, URINE  COMPREHENSIVE METABOLIC PANEL  ACETAMINOPHEN LEVEL  SALICYLATE LEVEL  ETHANOL  CARBAMAZEPINE LEVEL, TOTAL    Imaging Review No results found.   EKG Interpretation None      MDM   Final diagnoses:  Bipolar disorder, current episode depressed, severe, with psychotic features   The patient is brought to the emergency department in what appears to be decompensating bipolar disorder. She appears to be in a significantly depressive phase with very slow responses, poor insight into her own safety and maintenance and deteriorating social function. Review of the medical record indicates there is significant noncompliance issues with medications.    Arby Barrette, MD 10/20/14 [phone]

## 2014-10-20 NOTE — ED Notes (Signed)
Pt wants to stand at the nursing station and refuses to go back to her room. Redirected back to her room and pt asked if she could talk to someone. I asked who she wanted to talk to and if she needed to talk to me and pt did not reply; just looked around suspiciously.

## 2014-10-20 NOTE — ED Notes (Signed)
Report received from [facility] ([place] Pt. Alert and oriented in no distress denies SI, HI, AVH and pain. Pt. Standing in front of nurses station. Will continue to monitor for safety. Pt. Instructed to come to me with problems or concerns. Q 15 minute checks continue.

## 2014-10-20 NOTE — BH Assessment (Signed)
Attempted to complete assessment from 0623 to 0650; however pt only stared at this writer and did not provide a response to any questions. Pt stated "why you asking me all these questions". PT stood in the door way and did not allow this writer to enter into her room to complete the assessment. PT stared into space for several minutes and did not move from the door way. [employer] reported that pt was found walking down [address] and stared at them for 45 mins before providing them with her first name. [employer] also reported that pt stated "I am not gay" and "I am not a rapist". This Clinical research associate informed pt that she would need to be evaluated prior to a decision being made about her course of treatment. Dr. Donnald Garre has been informed that pt will be evaluated later today due to pt not responding to this writer.

## 2014-10-20 NOTE — ED Notes (Signed)
Patient up at nurse's station staring.  Requesting to see nurse and then will not talk.  She agrees to take her medication and then refuses.  Patient refused 80 mg geodon at 1700.  Informed patient that she may receive an injection if she refuses po medications. Patient also refused to speak with her husband on the telephone.

## 2014-10-20 NOTE — ED Notes (Signed)
Natalie Traub Sr. Telephone [phone] number of patients husband; called to speak with social worker today. Phone was transferred to Child psychotherapist.

## 2014-10-20 NOTE — ED Notes (Addendum)
Pt standing at the door and has been standing since she was brought from the other side of the ED. She keeps looking back at her food as if she wants to eat it but when redirected to eat she just shakes her head no. Refuses to take medications. Will follow-up with MD.

## 2014-10-20 NOTE — ED Notes (Signed)
Patient refused vitals.

## 2014-10-20 NOTE — Consult Note (Signed)
[facility] Face-to-Face Psychiatry Consult   Reason for Consult:  psychosis Referring Physician:  EDP  Natalie [name] is an 23 y.o. female. Total Time spent with patient: 45 minutes  Assessment: AXIS I:  Bipolar, mixed AXIS II:  Deferred AXIS III:   Past Medical History  Diagnosis Date  . Psychiatric diagnosis   . Chronic bipolar disorder   . OCD (obsessive compulsive disorder)   . H/O suicide attempt 05-2013    OD  . Anxiety   . Depression    AXIS IV:  other psychosocial or environmental problems AXIS V:  11-20 some danger of hurting self or others possible OR occasionally fails to maintain minimal personal hygiene OR gross impairment in communication  Plan:  Recommend psychiatric Inpatient admission when medically cleared.  Subjective:   Natalie [name] is a 23 y.o. female patient admitted with psychosis. Pt was interviewed with NP, chart reviewed. Pt refused to answer questions, and demonstrated thought blocking. Pt was standing in front of her doorway, just staring. History was obtained from staff, and SW spoke to father. Pt lives with father, and had conflict at home. Pt was pacing, confused, wandering streets prior to admission. Pt has not been adherent to home meds. Pt was standing for 5 hours at home, not sleeping or eating.  Dad petitioned pt. Pt was IVC'ed 1 mo ago for similar episode. Pt was previously being seen at [facility]. Pt has h/o 3 suicide attempts. Pt refuses to take PO meds at this time. Pt refuses to urinate or eat.  HPI:  See above. HPI Elements:   Location:  psychosis. Quality:  severe. Severity:  severe. Timing:  at least 1 month. Duration:  at least 1 month. Context:  not taking meds.  Past Psychiatric History: Past Medical History  Diagnosis Date  . Psychiatric diagnosis   . Chronic bipolar disorder   . OCD (obsessive compulsive disorder)   . H/O suicide attempt 05-2013    OD  . Anxiety   . Depression     reports that she has never smoked. She  does not have any smokeless tobacco history on file. She reports that she does not drink alcohol or use illicit drugs. History reviewed. No pertinent family history. Family History Substance Abuse: No Family Supports: Yes, List: (husband, parents, ) Living Arrangements: Parent Can pt return to current living arrangement?: Yes   Allergies:  No Known Allergies  ACT Assessment Complete:  Yes:    Educational Status    Risk to Self: Risk to self with the past 6 months Suicidal Ideation: No Suicidal Intent: No Is patient at risk for suicide?:  (unable to complete assessment , pt. non verbal, flat affect) What has been your use of drugs/alcohol within the last 12 months?: none reported by family Previous Attempts/Gestures: No Intentional Self Injurious Behavior: None Family Suicide History: Unknown Recent stressful life event(s): Other (Comment) (non compliant with med) Persecutory voices/beliefs?:  (unable to assess) Depression:  (unable to assess) Depression Symptoms:  (unable to assess) Substance abuse history and/or treatment for substance abuse?:  (unable to assess)  Risk to Others: Risk to Others within the past 6 months Homicidal Ideation:  (unable to assess) Thoughts of Harm to Others:  (unable to assess) Access to Homicidal Means:  (unable to assess) History of harm to others?:  (unable to assess) Assessment of Violence: None Noted Criminal Charges Pending?:  (unable to assess)  Abuse:    Prior Inpatient Therapy: Prior Inpatient Therapy Prior Inpatient Therapy: Yes Prior  Therapy Facilty/Provider(s): [facility]  Reason for Treatment: Bipolar D/O   Prior Outpatient Therapy: Prior Outpatient Therapy Prior Outpatient Therapy: Yes Prior Therapy Dates: Current  Prior Therapy Facilty/Provider(s): [facility] Reason for Treatment: Med Mgt   Additional Information: Additional Information 1:1 In Past 12 Months?: No CIRT Risk: No Elopement Risk: No Does patient have medical clearance?: Yes                   Objective: Blood pressure 138/85, pulse 79, temperature 98.2 F (36.8 C), temperature source Oral, resp. rate 16, SpO2 98 %.There is no weight on file to calculate BMI. Results for orders placed or performed during the hospital encounter of 10/20/14 (from the past 72 hour(s))  Urine rapid drug screen (hosp performed)     Status: None   Collection Time: 10/20/14  5:53 AM  Result Value Ref Range   Opiates NONE DETECTED NONE DETECTED   Cocaine NONE DETECTED NONE DETECTED   Benzodiazepines NONE DETECTED NONE DETECTED   Amphetamines NONE DETECTED NONE DETECTED   Tetrahydrocannabinol NONE DETECTED NONE DETECTED   Barbiturates NONE DETECTED NONE DETECTED    Comment:        DRUG SCREEN FOR MEDICAL PURPOSES ONLY.  IF CONFIRMATION IS NEEDED FOR ANY PURPOSE, NOTIFY LAB WITHIN 5 DAYS.        LOWEST DETECTABLE LIMITS FOR URINE DRUG SCREEN Drug Class       Cutoff (ng/mL) Amphetamine      1000 Barbiturate      200 Benzodiazepine   401 Tricyclics       027 Opiates          300 Cocaine          300 THC              50   Pregnancy, urine     Status: None   Collection Time: 10/20/14  5:53 AM  Result Value Ref Range   Preg Test, Ur NEGATIVE NEGATIVE    Comment:        THE SENSITIVITY OF THIS METHODOLOGY IS >20 mIU/mL.   CBC with Differential     Status: None   Collection Time: 10/20/14  5:57 AM  Result Value Ref Range   WBC 9.2 4.0 - 10.5 K/uL   RBC 4.08 3.87 - 5.11 MIL/uL   Hemoglobin 13.4 12.0 - 15.0 g/dL   HCT 39.4 36.0 - 46.0 %   MCV 96.6 78.0 - 100.0 fL   MCH 32.8 26.0 - 34.0 pg   MCHC 34.0 30.0 - 36.0 g/dL   RDW 12.8 11.5 - 15.5 %   Platelets 228 150 - 400 K/uL   Neutrophils Relative % 68 43 - 77 %   Neutro Abs 6.3 1.7 - 7.7 K/uL   Lymphocytes Relative 25 12 - 46 %   Lymphs Abs 2.3 0.7 - 4.0 K/uL   Monocytes Relative 5 3 - 12 %   Monocytes Absolute 0.5 0.1 - 1.0 K/uL   Eosinophils Relative 1 0 - 5 %   Eosinophils Absolute 0.1 0.0 - 0.7 K/uL    Basophils Relative 1 0 - 1 %   Basophils Absolute 0.1 0.0 - 0.1 K/uL  Comprehensive metabolic panel     Status: Abnormal   Collection Time: 10/20/14  5:57 AM  Result Value Ref Range   Sodium 137 137 - 147 mEq/L   Potassium 3.8 3.7 - 5.3 mEq/L   Chloride 96 96 - 112 mEq/L   CO2 24 19 - 32 mEq/L  Glucose, Bld 77 70 - 99 mg/dL   BUN 10 6 - 23 mg/dL   Creatinine, Ser 0.70 0.50 - 1.10 mg/dL   Calcium 10.4 8.4 - 10.5 mg/dL   Total Protein 9.1 (H) 6.0 - 8.3 g/dL   Albumin 5.0 3.5 - 5.2 g/dL   AST 21 0 - 37 U/L   ALT 19 0 - 35 U/L   Alkaline Phosphatase 102 39 - 117 U/L   Total Bilirubin 0.5 0.3 - 1.2 mg/dL   GFR calc non Af Amer >90 >90 mL/min   GFR calc Af Amer >90 >90 mL/min    Comment: (NOTE) The eGFR has been calculated using the CKD EPI equation. This calculation has not been validated in all clinical situations. eGFR's persistently <90 mL/min signify possible Chronic Kidney Disease.    Anion gap 17 (H) 5 - 15  Acetaminophen level     Status: None   Collection Time: 10/20/14  5:57 AM  Result Value Ref Range   Acetaminophen (Tylenol), Serum <15.0 10 - 30 ug/mL    Comment:        THERAPEUTIC CONCENTRATIONS VARY SIGNIFICANTLY. A RANGE OF 10-30 ug/mL MAY BE AN EFFECTIVE CONCENTRATION FOR MANY PATIENTS. HOWEVER, SOME ARE BEST TREATED AT CONCENTRATIONS OUTSIDE THIS RANGE. ACETAMINOPHEN CONCENTRATIONS >150 ug/mL AT 4 HOURS AFTER INGESTION AND >50 ug/mL AT 12 HOURS AFTER INGESTION ARE OFTEN ASSOCIATED WITH TOXIC REACTIONS.   Salicylate level     Status: Abnormal   Collection Time: 10/20/14  5:57 AM  Result Value Ref Range   Salicylate Lvl <1.8 (L) 2.8 - 20.0 mg/dL  Ethanol     Status: None   Collection Time: 10/20/14  5:57 AM  Result Value Ref Range   Alcohol, Ethyl (B) <11 0 - 11 mg/dL    Comment:        LOWEST DETECTABLE LIMIT FOR SERUM ALCOHOL IS 11 mg/dL FOR MEDICAL PURPOSES ONLY   Carbamazepine level, total     Status: Abnormal   Collection Time: 10/20/14   5:57 AM  Result Value Ref Range   Carbamazepine Lvl 3.7 (L) 4.0 - 12.0 ug/mL    Comment: Performed at [facility] are reviewed and are pertinent for tegretol level 3.7.  Current Facility-Administered Medications  Medication Dose Route Frequency Provider Last Rate Last Dose  . acetaminophen (TYLENOL) tablet 650 mg  650 mg Oral Q4H PRN Charlesetta Shanks, MD      . alum & mag hydroxide-simeth (MAALOX/MYLANTA) 200-200-20 MG/5ML suspension 30 mL  30 mL Oral PRN Charlesetta Shanks, MD      . benztropine (COGENTIN) tablet 0.5 mg  0.5 mg Oral BID Charlesetta Shanks, MD   0.5 mg at 10/20/14 0829  . ibuprofen (ADVIL,MOTRIN) tablet 600 mg  600 mg Oral Q8H PRN Charlesetta Shanks, MD      . LORazepam (ATIVAN) tablet 1 mg  1 mg Oral Q8H PRN Charlesetta Shanks, MD      . ondansetron (ZOFRAN) tablet 4 mg  4 mg Oral Q8H PRN Charlesetta Shanks, MD      . ziprasidone (GEODON) capsule 80 mg  80 mg Oral BID WC Charlesetta Shanks, MD   80 mg at 10/20/14 8416   Current Outpatient Prescriptions  Medication Sig Dispense Refill  . amoxicillin (AMOXIL) 500 MG capsule Take 500 mg by mouth 3 (three) times daily. Pt has 15 tabs left to take    . benztropine (COGENTIN) 0.5 MG tablet Take 1 tablet (0.5 mg total) by mouth 2 (two) times  daily. For involuntary movements brought on by medication 60 tablet 0  . carbamazepine (TEGRETOL XR) 100 MG 12 hr tablet Take 3 tablets (300 mg total) by mouth 2 (two) times daily. For mood stabilization 180 tablet 0  . hydrOXYzine (ATARAX/VISTARIL) 25 MG tablet Take 1 tablet (25 mg total) by mouth every 6 (six) hours as needed for anxiety. For anxiety 30 tablet 0  . lamoTRIgine (LAMICTAL) 100 MG tablet Take 1 tablet (100 mg total) by mouth 2 (two) times daily. 60 tablet 0  . traZODone (DESYREL) 100 MG tablet Take 2 tablets (200 mg total) by mouth at bedtime. For sleeplessness and depression 60 tablet 0  . ziprasidone (GEODON) 80 MG capsule Take 1 capsule (80 mg total) by mouth 2 (two) times daily with a  meal. For mood stabilization 60 capsule 0    Psychiatric Specialty Exam: Physical Exam  ROS  Blood pressure 138/85, pulse 79, temperature 98.2 F (36.8 C), temperature source Oral, resp. rate 16, SpO2 98 %.There is no weight on file to calculate BMI.  General Appearance: Bizarre, Disheveled and Guarded  Eye Contact::  Poor  Speech:  Blocked  Volume:  Decreased  Mood:  Depressed  Affect:  Flat  Thought Process:  thought blocking  Orientation:  Other:  confused  Thought Content:  responding to internal stimuli, paranoid  Suicidal Thoughts:  No  Homicidal Thoughts:  No  Memory:  Immediate;   Poor Recent;   Poor Remote;   Poor  Judgement:  Poor  Insight:  Shallow  Psychomotor Activity:  Decreased and Psychomotor Retardation  Concentration:  Poor  Recall:  Poor  Fund of Knowledge:Poor  Language: Poor  Akathisia:  Negative  Handed:  Right  AIMS (if indicated):     Assets:  Social Support  Sleep:      Musculoskeletal: Strength & Muscle Tone: within normal limits Gait & Station: normal Patient leans: Front  Treatment Plan Summary: Daily contact with patient to assess and evaluate symptoms and progress in treatment Medication management will encourage patient to take PO meds. pt will need to be admitted to a psych facility, due to inability to care for self. will need to consider forced medications, if pt does not urinate or eat.  Dereck Leep 10/20/2014 12:31 PM

## 2014-10-20 NOTE — ED Notes (Signed)
Pt. On paper scrubs, pt. And personal belongings wanded by Security. Personal belongings in 3 bags with 1 brown jacket, 1 gray pants, 2t-shirts, 2 undershirts and underwear, a brown purse, a pair of blue sneakers ,driver's license.  Kept in [employer] [id number].

## 2014-10-20 NOTE — ED Notes (Signed)
Pt. Brought in by [employer], pt. Was picked up on the  [employer]  , pt. Is nonverbal and flat affect. Pt. Claimed of being bi-polar to [employer]. Awaiting for IVC papers.

## 2014-10-20 NOTE — ED Notes (Addendum)
Pt refused vital signs.

## 2014-10-20 NOTE — BH Assessment (Signed)
Assessment Note  Natalie [name] is an 23 y.o. female. Patient was brought into the ED under IVC iniated by her husband because of bizarre behaviors, noncompliant with medications, not eating, and wandering. Patient is current presenting with thought blocking, teary eyed, flat affect, poor eye contact, and unable to answer questions appropriately.  Patient is staring at this writer during this interview appears to be having difficulty answering questions but trying desperately to say something.  "I have a lot of information to share but its hard".  Every time this writer attempted to leave the patient's room she would say a sentence but she was unable to say anything while this Clinical research associate stood in the room.  "Im having a lot of problems at home", "I don't want to say much about my parents", I am trying to tell you but its hard", "About two and half to three years". "I have my mom, dad, brother, and a lot of cousins", "I don't know", "no", "yes", "I am suppose to take a lot of medications recently but I don't know".  Patient reports that she has had episodes similar to her currently presentation in the past but unable to give specifics.    CSW received a call from the patient's husband Nira Visscher [phone] to collect collateral information.  He reports that the patient has a history of being noncompliant with medications, pacing, confused, walking in the street, not eating, and not talking.  He reports the patient is currently being followed by [facility] for medication management.  He reports he will talk with them on Monday about IM long acting medications.  He reports the family is very supportive and provided contact information for the patient's parents.  Leonette Most and [name] [phone].    CSW consulted with Dr. Tawni Carnes it is recommended to refer for inpatient treatment for stabilization and safety.    Axis I: Bipolar, mixed Axis II: Deferred Axis III:  Past Medical History  Diagnosis  Date  . Psychiatric diagnosis   . Chronic bipolar disorder   . OCD (obsessive compulsive disorder)   . H/O suicide attempt 05-2013    OD  . Anxiety   . Depression    Axis IV: other psychosocial or environmental problems, problems related to social environment, problems with access to health care services and problems with primary support group Axis V: 21-30 behavior considerably influenced by delusions or hallucinations OR serious impairment in judgment, communication OR inability to function in almost all areas  Past Medical History:  Past Medical History  Diagnosis Date  . Psychiatric diagnosis   . Chronic bipolar disorder   . OCD (obsessive compulsive disorder)   . H/O suicide attempt 05-2013    OD  . Anxiety   . Depression     History reviewed. No pertinent past surgical history.  Family History: History reviewed. No pertinent family history.  Social History:  reports that she has never smoked. She does not have any smokeless tobacco history on file. She reports that she does not drink alcohol or use illicit drugs.  Additional Social History:     CIWA: CIWA-Ar BP: 138/85 mmHg Pulse Rate: 79 COWS:    Allergies: No Known Allergies  Home Medications:  (Not in a hospital admission)  OB/GYN Status:  No LMP recorded. Patient is not currently having periods (Reason: Other).  General Assessment Data Location of Assessment: [facility] ACT Assessment: Yes Is this a Tele or Face-to-Face Assessment?: Face-to-Face Is this an Initial Assessment or a Re-assessment for  this encounter?: Initial Assessment Living Arrangements: Parent Can pt return to current living arrangement?: Yes Admission Status: Involuntary Is patient capable of signing voluntary admission?: No Transfer from: Home Referral Source: Self/Family/Friend  Medical Screening Exam [facility] Walk-in ONLY) Medical Exam completed: Yes  [facility] Crisis Care Plan Living Arrangements: Parent Name of Psychiatrist:   Vesta Mixer)     Risk to self with the past 6 months Suicidal Ideation: No Suicidal Intent: No Is patient at risk for suicide?:  (unable to complete assessment , pt. non verbal, flat affect) What has been your use of drugs/alcohol within the last 12 months?: none reported by family Previous Attempts/Gestures: No Intentional Self Injurious Behavior: None Family Suicide History: Unknown Recent stressful life event(s): Other (Comment) (non compliant with med) Persecutory voices/beliefs?:  (unable to assess) Depression:  (unable to assess) Depression Symptoms:  (unable to assess) Substance abuse history and/or treatment for substance abuse?:  (unable to assess)  Risk to Others within the past 6 months Homicidal Ideation:  (unable to assess) Thoughts of Harm to Others:  (unable to assess) Access to Homicidal Means:  (unable to assess) History of harm to others?:  (unable to assess) Assessment of Violence: None Noted Criminal Charges Pending?:  (unable to assess)  Psychosis Hallucinations:  (unable to assess) Delusions:  (unable to assess)  Mental Status Report Appear/Hygiene: Disheveled, In hospital gown Eye Contact: Poor Motor Activity: Mannerisms Speech: Aphasic Level of Consciousness: Alert Mood: Empty, Suspicious Affect: Inconsistent with thought content, Apprehensive, Flat, Constricted Anxiety Level:  (unable to assess) Thought Processes: Thought Blocking Judgement: Impaired Orientation: Unable to assess Obsessive Compulsive Thoughts/Behaviors: None  Cognitive Functioning Concentration: Poor Memory: Unable to Assess IQ: Average Insight: Poor Impulse Control: Poor Appetite: Poor Sleep: Unable to Assess Vegetative Symptoms: Unable to Assess  ADLScreening [facility] Assessment Services) Patient's cognitive ability adequate to safely complete daily activities?: Yes Patient able to express need for assistance with ADLs?: Yes Independently performs ADLs?: Yes (appropriate  for developmental age)  Prior Inpatient Therapy Prior Inpatient Therapy: Yes Prior Therapy Facilty/Provider(s): [facility]  Reason for Treatment: Bipolar D/O   Prior Outpatient Therapy Prior Outpatient Therapy: Yes Prior Therapy Dates: Current  Prior Therapy Facilty/Provider(s): [facility] Reason for Treatment: Med Mgt   ADL Screening (condition at time of admission) Patient's cognitive ability adequate to safely complete daily activities?: Yes Patient able to express need for assistance with ADLs?: Yes Independently performs ADLs?: Yes (appropriate for developmental age)                  Additional Information 1:1 In Past 12 Months?: No CIRT Risk: No Elopement Risk: No Does patient have medical clearance?: Yes     Disposition:  Disposition Initial Assessment Completed for this Encounter: Yes Disposition of Patient: Inpatient treatment program Type of inpatient treatment program: Adult  On Site Evaluation by:   Reviewed with Physician:    Maryelizabeth Rowan A 10/20/2014 11:22 AM

## 2014-10-20 NOTE — ED Notes (Signed)
Father reported that pt. had missed her psychotropic medications for several days , insomnia / anorexia , denies hallucinations . No suicidal ideation .

## 2014-10-20 NOTE — ED Notes (Signed)
PT refused VS, RN notifed
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
## 2014-10-20 NOTE — ED Notes (Signed)
Attempted to take VS and pt was very uncooperative. She is not aggressive but does not follow commands.
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
## 2014-10-20 NOTE — ED Notes (Signed)
Patient's belongings moved to locker 31.

## 2014-10-20 NOTE — ED Notes (Signed)
Pt refusing to allow staff to obtain vital signs at this time. RN notified.
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
## 2014-10-20 NOTE — BH Assessment (Signed)
Received call for assessment. Spoke with Dr. Ranae Palms who said Pt did not meet IVC criteria and has been discharged.  Harlin Rain Ria Comment, [facility] Triage Specialist [phone]

## 2014-10-21 ENCOUNTER — Encounter (HOSPITAL_COMMUNITY): Payer: Self-pay | Admitting: Registered Nurse

## 2014-10-21 DIAGNOSIS — F3160 Bipolar disorder, current episode mixed, unspecified: Secondary | ICD-10-CM

## 2014-10-21 DIAGNOSIS — F315 Bipolar disorder, current episode depressed, severe, with psychotic features: Secondary | ICD-10-CM | POA: Insufficient documentation

## 2014-10-21 MED ORDER — ZIPRASIDONE HCL 20 MG PO CAPS
40.0000 mg | ORAL_CAPSULE | Freq: Two times a day (BID) | ORAL | Status: DC
  Administered 2014-10-22 – 2014-10-23 (×4): 40 mg via ORAL
  Filled 2014-10-21 (×6): qty 2

## 2014-10-21 NOTE — ED Notes (Signed)
Dr Kathie Rhodes and shuvon np into see

## 2014-10-21 NOTE — ED Notes (Signed)
Pt did eat small amt of lunch, but declined to take the geodon because it is the wrong color.  Dr Kathie Rhodes aware and talked w/ the patient w/o success.  Pharmacy contacted and will send a 40mg  cap.

## 2014-10-21 NOTE — ED Notes (Signed)
Pt still has not eatten

## 2014-10-21 NOTE — Progress Notes (Signed)
Per Pattricia Boss with [facility] patient denied b/o adult MCD.  Maryelizabeth Rowan, MSW, LCSWA Evening Clinical Social Worker [phone]

## 2014-10-21 NOTE — BH Assessment (Signed)
Joann Glover, AC at [facility], confirmed adult unit is at capacity. Contacted the following facilities for placement:  AT CAPACITY: [facility], per Margaret [facility], per Jackie [facility], per Neal [facility], per Leah [facility], per [facility], per [facility], per Kathy [facility], per [facility], per Jane [facility], per Pamela [facility], per [facility], per Karen [facility], per [facility], per Bernadine [facility], per Ron [facility], per Billie [facility], per Gil[facility], per [facility], per [facility] (no acute beds)  NO RESPONSE: [facility]   Makinley Muscato Ellis Rufino Staup Jr, LPC, [facility] Triage Specialist [phone]  

## 2014-10-21 NOTE — ED Notes (Signed)
Give 1st dose geodon now it pt will eat VORB dr Tawni Carnes

## 2014-10-21 NOTE — ED Notes (Signed)
Dr s into see

## 2014-10-21 NOTE — ED Notes (Signed)
Report received from Lizbeth Bark RN. Pt. Alert in no distress denies SI, HI, AVH and pain. Will continue to monitor for safety. Pt. Instructed to come to me with problems or concerns. Q 15 minute checks continue.

## 2014-10-21 NOTE — Consult Note (Signed)
[facility] Face-to-Face Psychiatry Consult   Reason for Consult:  psychosis Referring Physician:  EDP  Natalie [name] is an 23 y.o. female. Total Time spent with patient: 45 minutes  Assessment: AXIS I:  Bipolar, mixed AXIS II:  Deferred AXIS III:   Past Medical History  Diagnosis Date  . Psychiatric diagnosis   . Chronic bipolar disorder   . OCD (obsessive compulsive disorder)   . H/O suicide attempt 05-2013    OD  . Anxiety   . Depression    AXIS IV:  other psychosocial or environmental problems AXIS V:  11-20 some danger of hurting self or others possible OR occasionally fails to maintain minimal personal hygiene OR gross impairment in communication  Plan:  Recommend psychiatric Inpatient admission when medically cleared.  Subjective:   Natalie [name] is a 23 y.o. female patient admitted with psychosis. Pt was interviewed with NP, chart reviewed. Pt refused to answer questions, and demonstrated thought blocking. Pt was standing in front of her doorway, just staring. History was obtained from staff, and SW spoke to father. Pt lives with father, and had conflict at home. Pt was pacing, confused, wandering streets prior to admission. Pt has not been adherent to home meds. Pt was standing for 5 hours at home, not sleeping or eating.  Dad petitioned pt. Pt was IVC'ed 1 mo ago for similar episode. Pt was previously being seen at [facility]. Pt has h/o 3 suicide attempts. Pt refuses to take PO meds at this time. Pt refuses to urinate or eat.  Patient continues to refuse medication. Patient states that her medications are to strong.  Discussed with patient decreasing Geodon to 40 mg.  Patient stats that she would take it if it was decreased.  Patient states that she has been missing her medications at home related to recent changes and then her father giving her his medication.    Patient continues to be paranoid, refusing to eat meals, medications and to drink.  Patient withdrawn.        HPI:  See above. HPI Elements:   Location:  psychosis. Quality:  severe. Severity:  severe. Timing:  at least 1 month. Duration:  at least 1 month. Context:  not taking meds.  Past Psychiatric History: Past Medical History  Diagnosis Date  . Psychiatric diagnosis   . Chronic bipolar disorder   . OCD (obsessive compulsive disorder)   . H/O suicide attempt 05-2013    OD  . Anxiety   . Depression     reports that she has never smoked. She does not have any smokeless tobacco history on file. She reports that she does not drink alcohol or use illicit drugs. History reviewed. No pertinent family history. Family History Substance Abuse: No Family Supports: Yes, List: (husband, parents, ) Living Arrangements: Parent Can pt return to current living arrangement?: Yes   Allergies:  No Known Allergies  ACT Assessment Complete:  Yes:    Educational Status    Risk to Self: Risk to self with the past 6 months Suicidal Ideation: No Suicidal Intent: No Is patient at risk for suicide?:  (unable to complete assessment , pt. non verbal, flat affect) What has been your use of drugs/alcohol within the last 12 months?: none reported by family Previous Attempts/Gestures: No Intentional Self Injurious Behavior: None Family Suicide History: Unknown Recent stressful life event(s): Other (Comment) (non compliant with med) Persecutory voices/beliefs?:  (unable to assess) Depression:  (unable to assess) Depression Symptoms:  (unable to assess) Substance  abuse history and/or treatment for substance abuse?: No  Risk to Others: Risk to Others within the past 6 months Homicidal Ideation:  (unable to assess) Thoughts of Harm to Others:  (unable to assess) Access to Homicidal Means:  (unable to assess) History of harm to others?:  (unable to assess) Assessment of Violence: None Noted Criminal Charges Pending?:  (unable to assess)  Abuse:    Prior Inpatient Therapy: Prior Inpatient  Therapy Prior Inpatient Therapy: Yes Prior Therapy Facilty/Provider(s): [facility]  Reason for Treatment: Bipolar D/O   Prior Outpatient Therapy: Prior Outpatient Therapy Prior Outpatient Therapy: Yes Prior Therapy Dates: Current  Prior Therapy Facilty/Provider(s): [facility] Reason for Treatment: Med Mgt   Additional Information: Additional Information 1:1 In Past 12 Months?: No CIRT Risk: No Elopement Risk: No Does patient have medical clearance?: Yes        Objective: Blood pressure 110/69, pulse 79, temperature 98.5 F (36.9 C), temperature source Oral, resp. rate 18, SpO2 100 %.There is no weight on file to calculate BMI. Results for orders placed or performed during the hospital encounter of 10/20/14 (from the past 72 hour(s))  Urine rapid drug screen (hosp performed)     Status: None   Collection Time: 10/20/14  5:53 AM  Result Value Ref Range   Opiates NONE DETECTED NONE DETECTED   Cocaine NONE DETECTED NONE DETECTED   Benzodiazepines NONE DETECTED NONE DETECTED   Amphetamines NONE DETECTED NONE DETECTED   Tetrahydrocannabinol NONE DETECTED NONE DETECTED   Barbiturates NONE DETECTED NONE DETECTED    Comment:        DRUG SCREEN FOR MEDICAL PURPOSES ONLY.  IF CONFIRMATION IS NEEDED FOR ANY PURPOSE, NOTIFY LAB WITHIN 5 DAYS.        LOWEST DETECTABLE LIMITS FOR URINE DRUG SCREEN Drug Class       Cutoff (ng/mL) Amphetamine      1000 Barbiturate      200 Benzodiazepine   675 Tricyclics       916 Opiates          300 Cocaine          300 THC              50   Pregnancy, urine     Status: None   Collection Time: 10/20/14  5:53 AM  Result Value Ref Range   Preg Test, Ur NEGATIVE NEGATIVE    Comment:        THE SENSITIVITY OF THIS METHODOLOGY IS >20 mIU/mL.   CBC with Differential     Status: None   Collection Time: 10/20/14  5:57 AM  Result Value Ref Range   WBC 9.2 4.0 - 10.5 K/uL   RBC 4.08 3.87 - 5.11 MIL/uL   Hemoglobin 13.4 12.0 - 15.0 g/dL   HCT 39.4 36.0 -  46.0 %   MCV 96.6 78.0 - 100.0 fL   MCH 32.8 26.0 - 34.0 pg   MCHC 34.0 30.0 - 36.0 g/dL   RDW 12.8 11.5 - 15.5 %   Platelets 228 150 - 400 K/uL   Neutrophils Relative % 68 43 - 77 %   Neutro Abs 6.3 1.7 - 7.7 K/uL   Lymphocytes Relative 25 12 - 46 %   Lymphs Abs 2.3 0.7 - 4.0 K/uL   Monocytes Relative 5 3 - 12 %   Monocytes Absolute 0.5 0.1 - 1.0 K/uL   Eosinophils Relative 1 0 - 5 %   Eosinophils Absolute 0.1 0.0 - 0.7 K/uL   Basophils Relative  1 0 - 1 %   Basophils Absolute 0.1 0.0 - 0.1 K/uL  Comprehensive metabolic panel     Status: Abnormal   Collection Time: 10/20/14  5:57 AM  Result Value Ref Range   Sodium 137 137 - 147 mEq/L   Potassium 3.8 3.7 - 5.3 mEq/L   Chloride 96 96 - 112 mEq/L   CO2 24 19 - 32 mEq/L   Glucose, Bld 77 70 - 99 mg/dL   BUN 10 6 - 23 mg/dL   Creatinine, Ser 0.70 0.50 - 1.10 mg/dL   Calcium 10.4 8.4 - 10.5 mg/dL   Total Protein 9.1 (H) 6.0 - 8.3 g/dL   Albumin 5.0 3.5 - 5.2 g/dL   AST 21 0 - 37 U/L   ALT 19 0 - 35 U/L   Alkaline Phosphatase 102 39 - 117 U/L   Total Bilirubin 0.5 0.3 - 1.2 mg/dL   GFR calc non Af Amer >90 >90 mL/min   GFR calc Af Amer >90 >90 mL/min    Comment: (NOTE) The eGFR has been calculated using the CKD EPI equation. This calculation has not been validated in all clinical situations. eGFR's persistently <90 mL/min signify possible Chronic Kidney Disease.    Anion gap 17 (H) 5 - 15  Acetaminophen level     Status: None   Collection Time: 10/20/14  5:57 AM  Result Value Ref Range   Acetaminophen (Tylenol), Serum <15.0 10 - 30 ug/mL    Comment:        THERAPEUTIC CONCENTRATIONS VARY SIGNIFICANTLY. A RANGE OF 10-30 ug/mL MAY BE AN EFFECTIVE CONCENTRATION FOR MANY PATIENTS. HOWEVER, SOME ARE BEST TREATED AT CONCENTRATIONS OUTSIDE THIS RANGE. ACETAMINOPHEN CONCENTRATIONS >150 ug/mL AT 4 HOURS AFTER INGESTION AND >50 ug/mL AT 12 HOURS AFTER INGESTION ARE OFTEN ASSOCIATED WITH TOXIC REACTIONS.   Salicylate level      Status: Abnormal   Collection Time: 10/20/14  5:57 AM  Result Value Ref Range   Salicylate Lvl <4.4 (L) 2.8 - 20.0 mg/dL  Ethanol     Status: None   Collection Time: 10/20/14  5:57 AM  Result Value Ref Range   Alcohol, Ethyl (B) <11 0 - 11 mg/dL    Comment:        LOWEST DETECTABLE LIMIT FOR SERUM ALCOHOL IS 11 mg/dL FOR MEDICAL PURPOSES ONLY   Carbamazepine level, total     Status: Abnormal   Collection Time: 10/20/14  5:57 AM  Result Value Ref Range   Carbamazepine Lvl 3.7 (L) 4.0 - 12.0 ug/mL    Comment: Performed at [facility] are reviewed and are pertinent for tegretol level 3.7.  Geodon decreased to 40 Bid.   Current Facility-Administered Medications  Medication Dose Route Frequency Provider Last Rate Last Dose  . acetaminophen (TYLENOL) tablet 650 mg  650 mg Oral Q4H PRN Charlesetta Shanks, MD      . alum & mag hydroxide-simeth (MAALOX/MYLANTA) 200-200-20 MG/5ML suspension 30 mL  30 mL Oral PRN Charlesetta Shanks, MD      . benztropine (COGENTIN) tablet 0.5 mg  0.5 mg Oral BID Charlesetta Shanks, MD   0.5 mg at 10/20/14 0829  . ibuprofen (ADVIL,MOTRIN) tablet 600 mg  600 mg Oral Q8H PRN Charlesetta Shanks, MD      . LORazepam (ATIVAN) tablet 1 mg  1 mg Oral Q8H PRN Charlesetta Shanks, MD      . ondansetron (ZOFRAN) tablet 4 mg  4 mg Oral Q8H PRN Charlesetta Shanks, MD      .  ziprasidone (GEODON) capsule 40 mg  40 mg Oral BID WC Skip Estimable, MD       Current Outpatient Prescriptions  Medication Sig Dispense Refill  . amoxicillin (AMOXIL) 500 MG capsule Take 500 mg by mouth 3 (three) times daily. Pt has 15 tabs left to take    . benztropine (COGENTIN) 0.5 MG tablet Take 1 tablet (0.5 mg total) by mouth 2 (two) times daily. For involuntary movements brought on by medication 60 tablet 0  . carbamazepine (TEGRETOL XR) 100 MG 12 hr tablet Take 3 tablets (300 mg total) by mouth 2 (two) times daily. For mood stabilization 180 tablet 0  . hydrOXYzine (ATARAX/VISTARIL) 25 MG tablet  Take 1 tablet (25 mg total) by mouth every 6 (six) hours as needed for anxiety. For anxiety 30 tablet 0  . lamoTRIgine (LAMICTAL) 100 MG tablet Take 1 tablet (100 mg total) by mouth 2 (two) times daily. 60 tablet 0  . traZODone (DESYREL) 100 MG tablet Take 2 tablets (200 mg total) by mouth at bedtime. For sleeplessness and depression 60 tablet 0  . ziprasidone (GEODON) 80 MG capsule Take 1 capsule (80 mg total) by mouth 2 (two) times daily with a meal. For mood stabilization 60 capsule 0    Psychiatric Specialty Exam: Physical Exam  Review of Systems  Unable to perform ROS: acuity of condition  Psychiatric/Behavioral: Positive for depression and memory loss. The patient is nervous/anxious.     Blood pressure 110/69, pulse 79, temperature 98.5 F (36.9 C), temperature source Oral, resp. rate 18, SpO2 100 %.There is no weight on file to calculate BMI.  General Appearance: Casual  Eye Contact::  Good  Speech:  Normal Rate  Volume:  Decreased  Mood:  Depressed  Affect:  Flat  Thought Process:  Circumstantial and thought blocking  Orientation:  Full (Time, Place, and Person)  Thought Content:  responding to internal stimuli, paranoid  Suicidal Thoughts:  No  Homicidal Thoughts:  No  Memory:  Immediate;   Fair Recent;   Fair Remote;   Fair  Judgement:  Poor  Insight:  Shallow  Psychomotor Activity:  Decreased and Psychomotor Retardation  Concentration:  Poor  Recall:  [place]  Language: Good  Akathisia:  No  Handed:  Right  AIMS (if indicated):     Assets:  Social Support  Sleep:      Musculoskeletal: Strength & Muscle Tone: within normal limits Gait & Station: normal Patient leans: Front  Treatment Plan Summary: Daily contact with patient to assess and evaluate symptoms and progress in treatment Medication management will encourage patient to take PO meds. pt will need to be admitted to a psych facility, due to inability to care for self. will need to  consider forced medications, if pt does not urinate or eat.  Will continue with the current treatment for inpatient treatment and stabilization.   Diany Formosa, FNP-BC 10/21/2014 3:10 PM

## 2014-10-21 NOTE — ED Notes (Signed)
Pt encouraged to eat crackers/cheese/juice for medication. Pt verbalized understanding

## 2014-10-21 NOTE — ED Notes (Addendum)
Pt sitting quietly in room, declined to take medications.  Pt responds w/ head movements, minimal verbal responses.  Pt is unable to answer questions.

## 2014-10-21 NOTE — BH Assessment (Signed)
[facility] Assessment Progress Note Accepting referrals: [facility]-declined per [facility]--no adult MCD . [facility] a few low acuity beds [place]--Per [facility], they have adult beds-no [facility]-none currently, but will take referrals for possible discharges tomorrow.- [place]-per Olegario Messier no adult beds, 1 female gero psych bed [place]-[facility]andi 1 bed available and several d/c for tomorrow [place]-[facility]-have 2 beds but about to fill them with referrals At capacity: [facility]-No beds per [facility]-declined--no adult MCD [place]-[facility] none today, will have discharges tomorrow [place]-[facility]-at capacity, will take referrals tomorrow [facility] beds, but per Alinda Money there will be adult d/c's tomorrow, per Misty Stanley accepting children to w. list [place]-[facility] no beds, should have some tomorrow [place]-per Joni Reining at [employer] available [facility] per [facility] [employer] [facility] no beds Unable to contact: [place]-unable to reach [place] [facility]-unable to reach

## 2014-10-21 NOTE — ED Notes (Signed)
Pt declined to take the 40mg  cap because it was not the right color, will continue to try

## 2014-10-21 NOTE — BH Assessment (Signed)
Per Margu[facility]te [facility] is capped for tonight but will have physcian review pt in AM   Clista Bernhardt, [facility] Triage Specialist 10/21/2014 9:11 PM

## 2014-10-21 NOTE — ED Notes (Signed)
Pt  Up in hall, continues to decline medication.

## 2014-10-22 NOTE — Progress Notes (Signed)
[facility] denied pt. Due to not being medically stable enough for them to take at this time.  Kateri Plummer, M.S., LPCA, [facility] Licensed Professional Counselor Associate  Triage Specialist  [facility]  Therapeutic Triage Services Phone: [phone] Fax: [phone]

## 2014-10-22 NOTE — ED Notes (Signed)
Pt talking on phone at present, calm & cooperative, no distress noted.  Will continue to monitor for safety.

## 2014-10-22 NOTE — ED Notes (Signed)
Patient continues to be very quiet and slow to respond to questions.  She is eating small quantities of food, I did encouraged her to eat half of her breakfast so that I could give her ziprasidone.  She was able to do that.  She visited with her husband for a while this afternoon and this was reassuring to her as she states she could not even remember if they were still married.

## 2014-10-22 NOTE — Consult Note (Signed)
[facility] Psychiatry Consult   Reason for Consult:  psychosis Referring Physician:  EDP  Natalie [name] is an 23 y.o. female. Total Time spent with patient: 25 minutes  Assessment: AXIS I:  Bipolar, mixed AXIS II:  Deferred AXIS III:   Past Medical History  Diagnosis Date  . Psychiatric diagnosis   . Chronic bipolar disorder   . OCD (obsessive compulsive disorder)   . H/O suicide attempt 05-2013    OD  . Anxiety   . Depression    AXIS IV:  other psychosocial or environmental problems AXIS V:  11-20 some danger of hurting self or others possible OR occasionally fails to maintain minimal personal hygiene OR gross impairment in communication  Plan:  Recommend psychiatric Inpatient admission when medically cleared.  Subjective:   Natalie [name] is a 23 y.o. female patient admitted with psychosis. Pt seen and chart reviewed by Dr. Darleene Cleaver. Per his assessment, pt continues to exhibit psychotic behaviors consistent with admission criteria. Pt will be admitted at [facility] if bed available to the 500 hall. If no bed available, refer to appropriate facility.   Patient continues to be paranoid, refusing to eat meals, medications and to drink.  Patient withdrawn.       HPI:  See above. HPI Elements:   Location:  psychosis. Quality:  severe. Severity:  severe. Timing:  at least 1 month. Duration:  at least 1 month. Context:  not taking meds.  Past Psychiatric History: Past Medical History  Diagnosis Date  . Psychiatric diagnosis   . Chronic bipolar disorder   . OCD (obsessive compulsive disorder)   . H/O suicide attempt 05-2013    OD  . Anxiety   . Depression     reports that she has never smoked. She does not have any smokeless tobacco history on file. She reports that she does not drink alcohol or use illicit drugs. History reviewed. No pertinent family history. Family History Substance Abuse: No Family Supports: Yes, List: (husband, parents,  ) Living Arrangements: Parent Can pt return to current living arrangement?: Yes   Allergies:  No Known Allergies  ACT Assessment Complete:  Yes:    Educational Status    Risk to Self: Risk to self with the past 6 months Suicidal Ideation: No Suicidal Intent: No Is patient at risk for suicide?:  (unable to complete assessment , pt. non verbal, flat affect) What has been your use of drugs/alcohol within the last 12 months?: none reported by family Previous Attempts/Gestures: No Intentional Self Injurious Behavior: None Family Suicide History: Unknown Recent stressful life event(s): Other (Comment) (non compliant with med) Persecutory voices/beliefs?:  (unable to assess) Depression:  (unable to assess) Depression Symptoms:  (unable to assess) Substance abuse history and/or treatment for substance abuse?: No  Risk to Others: Risk to Others within the past 6 months Homicidal Ideation:  (unable to assess) Thoughts of Harm to Others:  (unable to assess) Access to Homicidal Means:  (unable to assess) History of harm to others?:  (unable to assess) Assessment of Violence: None Noted Criminal Charges Pending?:  (unable to assess)  Abuse:    Prior Inpatient Therapy: Prior Inpatient Therapy Prior Inpatient Therapy: Yes Prior Therapy Facilty/Provider(s): [facility]  Reason for Treatment: Bipolar D/O   Prior Outpatient Therapy: Prior Outpatient Therapy Prior Outpatient Therapy: Yes Prior Therapy Dates: Current  Prior Therapy Facilty/Provider(s): [facility] Reason for Treatment: Med Mgt   Additional Information: Additional Information 1:1 In Past 12 Months?: No CIRT Risk: No Elopement Risk:  No Does patient have medical clearance?: Yes        Objective: Blood pressure 107/66, pulse 78, temperature 98 F (36.7 C), temperature source Oral, resp. rate 16, SpO2 97 %.There is no weight on file to calculate BMI. Results for orders placed or performed during the hospital encounter of 10/20/14  (from the past 72 hour(s))  Urine rapid drug screen (hosp performed)     Status: None   Collection Time: 10/20/14  5:53 AM  Result Value Ref Range   Opiates NONE DETECTED NONE DETECTED   Cocaine NONE DETECTED NONE DETECTED   Benzodiazepines NONE DETECTED NONE DETECTED   Amphetamines NONE DETECTED NONE DETECTED   Tetrahydrocannabinol NONE DETECTED NONE DETECTED   Barbiturates NONE DETECTED NONE DETECTED    Comment:        DRUG SCREEN FOR MEDICAL PURPOSES ONLY.  IF CONFIRMATION IS NEEDED FOR ANY PURPOSE, NOTIFY LAB WITHIN 5 DAYS.        LOWEST DETECTABLE LIMITS FOR URINE DRUG SCREEN Drug Class       Cutoff (ng/mL) Amphetamine      1000 Barbiturate      200 Benzodiazepine   010 Tricyclics       932 Opiates          300 Cocaine          300 THC              50   Pregnancy, urine     Status: None   Collection Time: 10/20/14  5:53 AM  Result Value Ref Range   Preg Test, Ur NEGATIVE NEGATIVE    Comment:        THE SENSITIVITY OF THIS METHODOLOGY IS >20 mIU/mL.   CBC with Differential     Status: None   Collection Time: 10/20/14  5:57 AM  Result Value Ref Range   WBC 9.2 4.0 - 10.5 K/uL   RBC 4.08 3.87 - 5.11 MIL/uL   Hemoglobin 13.4 12.0 - 15.0 g/dL   HCT 39.4 36.0 - 46.0 %   MCV 96.6 78.0 - 100.0 fL   MCH 32.8 26.0 - 34.0 pg   MCHC 34.0 30.0 - 36.0 g/dL   RDW 12.8 11.5 - 15.5 %   Platelets 228 150 - 400 K/uL   Neutrophils Relative % 68 43 - 77 %   Neutro Abs 6.3 1.7 - 7.7 K/uL   Lymphocytes Relative 25 12 - 46 %   Lymphs Abs 2.3 0.7 - 4.0 K/uL   Monocytes Relative 5 3 - 12 %   Monocytes Absolute 0.5 0.1 - 1.0 K/uL   Eosinophils Relative 1 0 - 5 %   Eosinophils Absolute 0.1 0.0 - 0.7 K/uL   Basophils Relative 1 0 - 1 %   Basophils Absolute 0.1 0.0 - 0.1 K/uL  Comprehensive metabolic panel     Status: Abnormal   Collection Time: 10/20/14  5:57 AM  Result Value Ref Range   Sodium 137 137 - 147 mEq/L   Potassium 3.8 3.7 - 5.3 mEq/L   Chloride 96 96 - 112 mEq/L    CO2 24 19 - 32 mEq/L   Glucose, Bld 77 70 - 99 mg/dL   BUN 10 6 - 23 mg/dL   Creatinine, Ser 0.70 0.50 - 1.10 mg/dL   Calcium 10.4 8.4 - 10.5 mg/dL   Total Protein 9.1 (H) 6.0 - 8.3 g/dL   Albumin 5.0 3.5 - 5.2 g/dL   AST 21 0 - 37 U/L   ALT  19 0 - 35 U/L   Alkaline Phosphatase 102 39 - 117 U/L   Total Bilirubin 0.5 0.3 - 1.2 mg/dL   GFR calc non Af Amer >90 >90 mL/min   GFR calc Af Amer >90 >90 mL/min    Comment: (NOTE) The eGFR has been calculated using the CKD EPI equation. This calculation has not been validated in all clinical situations. eGFR's persistently <90 mL/min signify possible Chronic Kidney Disease.    Anion gap 17 (H) 5 - 15  Acetaminophen level     Status: None   Collection Time: 10/20/14  5:57 AM  Result Value Ref Range   Acetaminophen (Tylenol), Serum <15.0 10 - 30 ug/mL    Comment:        THERAPEUTIC CONCENTRATIONS VARY SIGNIFICANTLY. A RANGE OF 10-30 ug/mL MAY BE AN EFFECTIVE CONCENTRATION FOR MANY PATIENTS. HOWEVER, SOME ARE BEST TREATED AT CONCENTRATIONS OUTSIDE THIS RANGE. ACETAMINOPHEN CONCENTRATIONS >150 ug/mL AT 4 HOURS AFTER INGESTION AND >50 ug/mL AT 12 HOURS AFTER INGESTION ARE OFTEN ASSOCIATED WITH TOXIC REACTIONS.   Salicylate level     Status: Abnormal   Collection Time: 10/20/14  5:57 AM  Result Value Ref Range   Salicylate Lvl <7.9 (L) 2.8 - 20.0 mg/dL  Ethanol     Status: None   Collection Time: 10/20/14  5:57 AM  Result Value Ref Range   Alcohol, Ethyl (B) <11 0 - 11 mg/dL    Comment:        LOWEST DETECTABLE LIMIT FOR SERUM ALCOHOL IS 11 mg/dL FOR MEDICAL PURPOSES ONLY   Carbamazepine level, total     Status: Abnormal   Collection Time: 10/20/14  5:57 AM  Result Value Ref Range   Carbamazepine Lvl 3.7 (L) 4.0 - 12.0 ug/mL    Comment: Performed at [facility]  Urine culture     Status: None (Preliminary result)   Collection Time: 10/20/14 11:42 AM  Result Value Ref Range   Specimen Description URINE, RANDOM     Special Requests NONE    Culture  Setup Time      10/20/2014 19:00 Performed at [facility]      75,000 COLONIES/ML Performed at [facility] Performed at [facility] are reviewed and are pertinent for tegretol level 3.7.  Geodon decreased to 40 Bid.   Current Facility-Administered Medications  Medication Dose Route Frequency Provider Last Rate Last Dose  . acetaminophen (TYLENOL) tablet 650 mg  650 mg Oral Q4H PRN Charlesetta Shanks, MD      . alum & mag hydroxide-simeth (MAALOX/MYLANTA) 200-200-20 MG/5ML suspension 30 mL  30 mL Oral PRN Charlesetta Shanks, MD      . benztropine (COGENTIN) tablet 0.5 mg  0.5 mg Oral BID Charlesetta Shanks, MD   0.5 mg at 10/22/14 0930  . ibuprofen (ADVIL,MOTRIN) tablet 600 mg  600 mg Oral Q8H PRN Charlesetta Shanks, MD      . LORazepam (ATIVAN) tablet 1 mg  1 mg Oral Q8H PRN Charlesetta Shanks, MD   1 mg at 10/22/14 0022  . ondansetron (ZOFRAN) tablet 4 mg  4 mg Oral Q8H PRN Charlesetta Shanks, MD      . ziprasidone (GEODON) capsule 40 mg  40 mg Oral BID WC Skip Estimable, MD   40 mg at 10/22/14 0240   Current Outpatient Prescriptions  Medication Sig  Dispense Refill  . amoxicillin (AMOXIL) 500 MG capsule Take 500 mg by mouth 3 (three) times daily. Pt has 15 tabs left to take    . benztropine (COGENTIN) 0.5 MG tablet Take 1 tablet (0.5 mg total) by mouth 2 (two) times daily. For involuntary movements brought on by medication 60 tablet 0  . carbamazepine (TEGRETOL XR) 100 MG 12 hr tablet Take 3 tablets (300 mg total) by mouth 2 (two) times daily. For mood stabilization 180 tablet 0  . hydrOXYzine (ATARAX/VISTARIL) 25 MG tablet Take 1 tablet (25 mg total) by mouth every 6 (six) hours as needed for anxiety. For anxiety 30 tablet 0  . lamoTRIgine (LAMICTAL) 100 MG tablet Take 1 tablet (100 mg total) by mouth 2 (two) times daily. 60 tablet 0  . traZODone (DESYREL) 100  MG tablet Take 2 tablets (200 mg total) by mouth at bedtime. For sleeplessness and depression 60 tablet 0  . ziprasidone (GEODON) 80 MG capsule Take 1 capsule (80 mg total) by mouth 2 (two) times daily with a meal. For mood stabilization 60 capsule 0    Psychiatric Specialty Exam: Physical Exam  Review of Systems  Unable to perform ROS: acuity of condition  Psychiatric/Behavioral: Positive for depression and memory loss. The patient is nervous/anxious.     Blood pressure 107/66, pulse 78, temperature 98 F (36.7 C), temperature source Oral, resp. rate 16, SpO2 97 %.There is no weight on file to calculate BMI.  General Appearance: Casual  Eye Contact::  Good  Speech:  Normal Rate  Volume:  Decreased  Mood:  Depressed  Affect:  Flat  Thought Process:  Circumstantial and thought blocking  Orientation:  Full (Time, Place, and Person)  Thought Content:  responding to internal stimuli, paranoid  Suicidal Thoughts:  No  Homicidal Thoughts:  No  Memory:  Immediate;   Fair Recent;   Fair Remote;   Fair  Judgement:  Poor  Insight:  Shallow  Psychomotor Activity:  Decreased and Psychomotor Retardation  Concentration:  Poor  Recall:  [place]  Language: Good  Akathisia:  No  Handed:  Right  AIMS (if indicated):     Assets:  Social Support  Sleep:      Musculoskeletal: Strength & Muscle Tone: within normal limits Gait & Station: normal Patient leans: Front  Treatment Plan Summary: Daily contact with patient to assess and evaluate symptoms and progress in treatment Medication management  Will continue with the current treatment for inpatient treatment and stabilization.   *Case reviewed/discussed with Dr. Latricia Heft, Elyse Jarvis, FNP-BC 10/22/2014 11:17 AM  Patient seen, evaluated and I agree with notes by Nurse Practitioner. Corena Pilgrim, MD

## 2014-10-22 NOTE — Progress Notes (Signed)
CSW went to visit with Pt, upon her request. However, once the CSW went to speak with Pt she was very reserved and would not tell what she wanted to discuss.   CSW will attempt to visit Pt again later to see if Pt has any further needs.   Trish Mage [phone] ED CSW 10/22/2014 5:10 PM

## 2014-10-22 NOTE — Progress Notes (Signed)
10/22/14 Below are a list of therapeutic placement options that were contacted to see if any bed were open.   Accepting referrals [facility] accepting referrals  [facility] accepting referrals per Jim  [facility] Accepting referrals per Linda  [facility]- Fax referrals may have an opening later per Joselin  [facility] Accepting pts. Per Dean  [facility] Accepting referrals please fax per Cherie  [facility] Accepting referrals please fax per [facility]  [facility]- no high acuity, adolescents or substance abuse  [facility]-Per Miriam adult M, F low acuity, no detox [facility]-fax info [phone] Per Adeya [facility]-per Deanna-can fax over-possible discharges [facility]-per Teranda send referrals [facility] accepting referrals at capacity today but may change [facility] per Chris- 2 beds available please fax referrals  ? At [facility] per Ashley  [facility] per Jill  [facility] per Marcy [facility] per Melissa  [facility]-per Tonya at capacity [facility]-Per Megan-at capacity for adult and C/A [facility]-per Joanie, but try again this evening [facility]-per Beth, no available beds [facility]- no beds per Will Unable to reach [facility]-Unable to reach [facility]  [facility]- busy tone  [facility] left VM  [facility] # not in service  [facility]  [facility]- left VM  Cheyene Hamric, M.S., LPCA, [facility] Licensed Professional Counselor Associate  Triage Specialist  [facility]  Therapeutic Triage Services Phone: [phone] Fax: [phone] 

## 2014-10-22 NOTE — ED Notes (Signed)
Pt. States that she "shouldn't be here". Increased anxiety noted. Pt. Agreed to take medications to allow her to rest.

## 2014-10-23 LAB — URINE CULTURE: Colony Count: 75000

## 2014-10-23 MED ORDER — ZIPRASIDONE HCL 40 MG PO CAPS
40.0000 mg | ORAL_CAPSULE | Freq: Two times a day (BID) | ORAL | Status: DC
Start: 2014-10-23 — End: 2015-04-04

## 2014-10-23 NOTE — Progress Notes (Signed)
Report given to Phyllys Cobb, RN at [facility] where pt is scheduled to be transfer under Dr. Ardyth Harps in room 319 B.

## 2014-10-23 NOTE — Progress Notes (Signed)
[facility] [phone]) to give report on this pt, was informed by nurse that Ms. Lang cannot go over until change of shift--after 7 pm as there's still a pt in the bed that she's expected to have; therefore that bed will not be available till then.

## 2014-10-23 NOTE — Progress Notes (Signed)
CSW spoke with Pt per her request. Pt appears to be frustrated. CSW asked Pt if she needed anything. However, the Pt just wanted to know Why was she still here. Pt stated she has already received the help she asked for, then stated " If I were to rebel, would I be put somewhere else?"   Trish Mage [phone] ED CSW 10/23/2014 9:53 PM

## 2014-10-23 NOTE — ED Notes (Signed)
Pt refused vitals 

## 2014-10-23 NOTE — ED Notes (Signed)
Patient requesting her personal belongings. Appears agitated. States she is ready to go.  Encouragement offered.  Q 15 safety checks in place.

## 2014-10-23 NOTE — Consult Note (Signed)
[facility] Followup Face-to-Face Psychiatry Consult   Natalie [name] is an 23 y.o. female. Total Time spent with patient: 25 minutes  Assessment: AXIS I:  Bipolar, mixed AXIS II:  Deferred AXIS III:   Past Medical History  Diagnosis Date  . Psychiatric diagnosis   . Chronic bipolar disorder   . OCD (obsessive compulsive disorder)   . H/O suicide attempt 05-2013    OD  . Anxiety   . Depression    AXIS IV:  other psychosocial or environmental problems AXIS V:  11-20 some danger of hurting self or others possible OR occasionally fails to maintain minimal personal hygiene OR gross impairment in communication  Plan:  Recommend psychiatric Inpatient admission when medically cleared.  Subjective:   Natalie [name] is a 23 y.o. female patient admitted with psychosis. Pt seen and chart reviewed by Dr. Jannifer Franklin. Pt is in the same unstable condition as yesterday and is very withdrawn. Per Dr. Jannifer Franklin, pt to be admitted. Pt has been accepted at [facility] by Dr. Delman Cheadle.    HPI:  See above. HPI Elements:   Location:  psychosis. Quality:  severe. Severity:  severe. Timing:  at least 1 month. Duration:  at least 1 month. Context:  not taking meds.  Past Psychiatric History: Past Medical History  Diagnosis Date  . Psychiatric diagnosis   . Chronic bipolar disorder   . OCD (obsessive compulsive disorder)   . H/O suicide attempt 05-2013    OD  . Anxiety   . Depression     reports that she has never smoked. She does not have any smokeless tobacco history on file. She reports that she does not drink alcohol or use illicit drugs. History reviewed. No pertinent family history. Family History Substance Abuse: No Family Supports: Yes, List: (husband, parents, ) Living Arrangements: Parent Can pt return to current living arrangement?: Yes   Allergies:  No Known Allergies  ACT Assessment Complete:  Yes:    Educational Status    Risk to Self: Risk to self with the past 6  months Suicidal Ideation: No Suicidal Intent: No Is patient at risk for suicide?:  (unable to complete assessment , pt. non verbal, flat affect) What has been your use of drugs/alcohol within the last 12 months?: none reported by family Previous Attempts/Gestures: No Intentional Self Injurious Behavior: None Family Suicide History: Unknown Recent stressful life event(s): Other (Comment) (non compliant with med) Persecutory voices/beliefs?:  (unable to assess) Depression:  (unable to assess) Depression Symptoms:  (unable to assess) Substance abuse history and/or treatment for substance abuse?: No  Risk to Others: Risk to Others within the past 6 months Homicidal Ideation:  (unable to assess) Thoughts of Harm to Others:  (unable to assess) Access to Homicidal Means:  (unable to assess) History of harm to others?:  (unable to assess) Assessment of Violence: None Noted Criminal Charges Pending?:  (unable to assess)  Abuse:    Prior Inpatient Therapy: Prior Inpatient Therapy Prior Inpatient Therapy: Yes Prior Therapy Facilty/Provider(s): [facility]  Reason for Treatment: Bipolar D/O   Prior Outpatient Therapy: Prior Outpatient Therapy Prior Outpatient Therapy: Yes Prior Therapy Dates: Current  Prior Therapy Facilty/Provider(s): [facility] Reason for Treatment: Med Mgt   Additional Information: Additional Information 1:1 In Past 12 Months?: No CIRT Risk: No Elopement Risk: No Does patient have medical clearance?: Yes        Objective: Blood pressure 121/59, pulse 80, temperature 98.2 F (36.8 C), temperature source Oral, resp. rate 18, SpO2 100 %.There is no  weight on file to calculate BMI. No results found for this or any previous visit (from the past 72 hour(s)). Labs are reviewed and are pertinent for tegretol level 3.7.  Geodon decreased to 40 Bid.   Current Facility-Administered Medications  Medication Dose Route Frequency Provider Last Rate Last Dose  . acetaminophen (TYLENOL)  tablet 650 mg  650 mg Oral Q4H PRN Arby Barrette, MD      . alum & mag hydroxide-simeth (MAALOX/MYLANTA) 200-200-20 MG/5ML suspension 30 mL  30 mL Oral PRN Arby Barrette, MD      . benztropine (COGENTIN) tablet 0.5 mg  0.5 mg Oral BID Arby Barrette, MD   0.5 mg at 10/23/14 0926  . ibuprofen (ADVIL,MOTRIN) tablet 600 mg  600 mg Oral Q8H PRN Arby Barrette, MD      . LORazepam (ATIVAN) tablet 1 mg  1 mg Oral Q8H PRN Arby Barrette, MD   1 mg at 10/22/14 0022  . ondansetron (ZOFRAN) tablet 4 mg  4 mg Oral Q8H PRN Arby Barrette, MD      . ziprasidone (GEODON) capsule 40 mg  40 mg Oral BID WC Caprice Kluver, MD   40 mg at 10/23/14 6004   Current Outpatient Prescriptions  Medication Sig Dispense Refill  . amoxicillin (AMOXIL) 500 MG capsule Take 500 mg by mouth 3 (three) times daily. Pt has 15 tabs left to take    . benztropine (COGENTIN) 0.5 MG tablet Take 1 tablet (0.5 mg total) by mouth 2 (two) times daily. For involuntary movements brought on by medication 60 tablet 0  . carbamazepine (TEGRETOL XR) 100 MG 12 hr tablet Take 3 tablets (300 mg total) by mouth 2 (two) times daily. For mood stabilization 180 tablet 0  . hydrOXYzine (ATARAX/VISTARIL) 25 MG tablet Take 1 tablet (25 mg total) by mouth every 6 (six) hours as needed for anxiety. For anxiety 30 tablet 0  . lamoTRIgine (LAMICTAL) 100 MG tablet Take 1 tablet (100 mg total) by mouth 2 (two) times daily. 60 tablet 0  . traZODone (DESYREL) 100 MG tablet Take 2 tablets (200 mg total) by mouth at bedtime. For sleeplessness and depression 60 tablet 0  . ziprasidone (GEODON) 80 MG capsule Take 1 capsule (80 mg total) by mouth 2 (two) times daily with a meal. For mood stabilization 60 capsule 0    Psychiatric Specialty Exam: Physical Exam  Review of Systems  Unable to perform ROS: acuity of condition  Psychiatric/Behavioral: Positive for depression and memory loss. The patient is nervous/anxious.     Blood pressure 121/59, pulse 80,  temperature 98.2 F (36.8 C), temperature source Oral, resp. rate 18, SpO2 100 %.There is no weight on file to calculate BMI.  General Appearance: Casual  Eye Contact::  Good  Speech:  Normal Rate  Volume:  Decreased  Mood:  Depressed  Affect:  Flat  Thought Process:  Circumstantial and thought blocking  Orientation:  Full (Time, Place, and Person)  Thought Content:  responding to internal stimuli, paranoid  Suicidal Thoughts:  No  Homicidal Thoughts:  No  Memory:  Immediate;   Fair Recent;   Fair Remote;   Fair  Judgement:  Poor  Insight:  Shallow  Psychomotor Activity:  Decreased and Psychomotor Retardation  Concentration:  Poor  Recall:  Fair  Fund of Knowledge:Fair  Language: Good  Akathisia:  No  Handed:  Right  AIMS (if indicated):     Assets:  Social Support  Sleep:      Musculoskeletal: Strength &  Muscle Tone: within normal limits Gait & Station: normal Patient leans: Front  Treatment Plan Summary: Daily contact with patient to assess and evaluate symptoms and progress in treatment Medication management  Will continue with the current treatment for inpatient treatment and stabilization.   *Case reviewed/discussed with Dr. Colvin Caroli, Everardo All, FNP-BC 10/23/2014 1:57 PM   Patient seen, evaluated and I agree with notes by Nurse Practitioner. Thedore Mins, MD

## 2014-10-23 NOTE — Progress Notes (Signed)
Natalie [name] from [place] called and said that pt was accepted to [facility] by Dr. Delman Cheadle. Pt is IVC. No report number to give at this moment. Tressa's number is [phone].  Kateri Plummer, M.S., LPCA, [facility] Licensed Professional Counselor Associate  Triage Specialist  [facility]  Therapeutic Triage Services Phone: [phone] Fax: [phone]

## 2014-10-23 NOTE — Progress Notes (Signed)
Pt alert, selectively mute intermittently this shift. Observed standing in milieu at nursing station with phone in her hand and standing in bedroom starring at walls and tables at intervals during shift, will not talk to staff when approach. Took medications with increased verbal encouragement. Refused both breakfast and ate approximately 25% of lunch when offered. Questioning MD's order for inpatient treatment (Transfer to [facility]). Emotional support and availability offered.  Pt denied pain, A / V hallucinations when assessed earlier this shift. Husband is at bedside at this time visiting with pt. Q 15 minutes checks maintained as ordered and pt remains safe on unit.

## 2014-10-24 ENCOUNTER — Inpatient Hospital Stay: Payer: Self-pay | Admitting: Psychiatry

## 2014-10-24 NOTE — ED Notes (Signed)
Patient continues to stand in doorway of her room. Minimal interaction with staff. Nodded no when asked if she needed anything.   Encouragement offered. Cogentin offered again.  Q 15 safety checks continue.

## 2014-10-24 NOTE — Progress Notes (Signed)
Pt alert, standing in hall in milieu, starring at staff. Pt refused to eat her breakfast and lunch tray when offered. Multiple verbal encouragement and support was given to pt assist her comply with ordered treatment regimen but pt still refused her AM medications and vitals as ordered. Presently awaiting sheriff pickup for transfer to [facility]. Writer spoke to sheriff who confirmed pick up time between 1430-1500 as he's waiting on a female officer to report to his office to enable pt  Transfer. Safety maintained on  Q 15 minutes checks without physical distress to note at this time.

## 2014-10-24 NOTE — ED Notes (Signed)
Patient remains awake. Back and forth between her room and the hall. Patient makes minimal eye contact, non verbal at present.   Q 15 safety checks continue.

## 2014-10-24 NOTE — ED Notes (Signed)
Patient up all night. States that she is ready to go. States "You're just trying to keep me here". Standing in front of the nurses station staring angrily, minimal communication continues.  Encouragement offered.   Q 15 safety checks in place.

## 2014-10-24 NOTE — ED Notes (Signed)
Patient has not slept. Up to nurses station. Non-verbal.   Offered snack. Offered Cogentin. Reminded of the hour and encouraged to rest.  Q 15 safety checks continue.

## 2014-10-24 NOTE — ED Notes (Signed)
Pt refused vitals 

## 2014-10-24 NOTE — Progress Notes (Signed)
Pt transferred to [facility] and was picked up by [facility] department d/t IVC status. Pt was alert, ambulatory without physical distress at time of departure from unit. Remained selectively mute with a smirk on her face as she walked off the unit with the sheriff. Pt was cooperative with transfer, however she refused vitals. All belongings given to sheriff from locker 37. Safety maintained on Q 15 minutes checks till time of d/c.

## 2014-10-27 LAB — URINALYSIS, COMPLETE
Bilirubin,UR: NEGATIVE
Blood: NEGATIVE
Glucose,UR: NEGATIVE mg/dL (ref 0–75)
Nitrite: NEGATIVE
Ph: 5 (ref 4.5–8.0)
Protein: 30
Specific Gravity: 1.026 (ref 1.003–1.030)

## 2014-10-27 LAB — DRUG SCREEN, URINE
Amphetamines, Ur Screen: NEGATIVE (ref ?–1000)
BARBITURATES, UR SCREEN: NEGATIVE (ref ?–200)
Benzodiazepine, Ur Scrn: NEGATIVE (ref ?–200)
CANNABINOID 50 NG, UR ~~LOC~~: NEGATIVE (ref ?–50)
COCAINE METABOLITE, UR ~~LOC~~: NEGATIVE (ref ?–300)
MDMA (ECSTASY) UR SCREEN: POSITIVE (ref ?–500)
Methadone, Ur Screen: NEGATIVE (ref ?–300)
Opiate, Ur Screen: NEGATIVE (ref ?–300)
Phencyclidine (PCP) Ur S: NEGATIVE (ref ?–25)
TRICYCLIC, UR SCREEN: NEGATIVE (ref ?–1000)

## 2014-10-27 LAB — PREGNANCY, URINE: PREGNANCY TEST, URINE: NEGATIVE m[IU]/mL

## 2014-10-28 LAB — BASIC METABOLIC PANEL
Anion Gap: 8 (ref 7–16)
BUN: 15 mg/dL (ref 7–18)
Calcium, Total: 10.2 mg/dL — ABNORMAL HIGH (ref 8.5–10.1)
Chloride: 102 mmol/L (ref 98–107)
Co2: 29 mmol/L (ref 21–32)
Creatinine: 0.72 mg/dL (ref 0.60–1.30)
EGFR (African American): >60
EGFR (Non-African Amer.): >60
Glucose: 91 mg/dL (ref 65–99)
Osmolality: 278 (ref 275–301)
Potassium: 3.7 mmol/L (ref 3.5–5.1)
Sodium: 139 mmol/L (ref 136–145)

## 2014-10-28 LAB — CBC WITH DIFFERENTIAL/PLATELET
Basophil #: 0.1 10*3/uL (ref 0.0–0.1)
Basophil %: 0.6 %
Eosinophil #: 0.1 10*3/uL (ref 0.0–0.7)
Eosinophil %: 0.6 %
HCT: 41.3 % (ref 35.0–47.0)
HGB: 13.7 g/dL (ref 12.0–16.0)
Lymphocyte #: 1.4 10*3/uL (ref 1.0–3.6)
Lymphocyte %: 14.7 %
MCH: 33.3 pg (ref 26.0–34.0)
MCHC: 33.1 g/dL (ref 32.0–36.0)
MCV: 100 fL (ref 80–100)
Monocyte #: 0.3 x10 3/mm (ref 0.2–0.9)
Monocyte %: 3.3 %
Neutrophil #: 8 10*3/uL — ABNORMAL HIGH (ref 1.4–6.5)
Neutrophil %: 80.8 %
Platelet: 197 10*3/uL (ref 150–440)
RBC: 4.11 10*6/uL (ref 3.80–5.20)
RDW: 13 % (ref 11.5–14.5)
WBC: 9.9 10*3/uL (ref 3.6–11.0)

## 2014-11-04 LAB — BASIC METABOLIC PANEL
Anion Gap: 9 (ref 7–16)
BUN: 11 mg/dL (ref 7–18)
Calcium, Total: 9.2 mg/dL (ref 8.5–10.1)
Chloride: 103 mmol/L (ref 98–107)
Co2: 26 mmol/L (ref 21–32)
Creatinine: 0.68 mg/dL (ref 0.60–1.30)
EGFR (African American): >60
EGFR (Non-African Amer.): >60
Glucose: 98 mg/dL (ref 65–99)
Osmolality: 275 (ref 275–301)
Potassium: 4.4 mmol/L (ref 3.5–5.1)
Sodium: 138 mmol/L (ref 136–145)

## 2014-11-04 LAB — LITHIUM LEVEL: Lithium: 0.73 mmol/L

## 2014-11-05 LAB — HEMOGLOBIN A1C: Hemoglobin A1C: 4.8 % (ref 4.2–6.3)

## 2014-11-05 LAB — LIPID PANEL
CHOLESTEROL: 138 mg/dL (ref 0–200)
HDL Cholesterol: 53 mg/dL (ref 40–60)
LDL CHOLESTEROL, CALC: 67 mg/dL (ref 0–100)
Triglycerides: 90 mg/dL (ref 0–200)
VLDL CHOLESTEROL, CALC: 18 mg/dL (ref 5–40)

## 2014-11-06 LAB — LITHIUM LEVEL: LITHIUM: 0.69 mmol/L

## 2014-11-08 ENCOUNTER — Emergency Department (HOSPITAL_COMMUNITY)
Admission: EM | Admit: 2014-11-08 | Discharge: 2014-11-08 | Disposition: A | Discharge status: Home / Self Care | Payer: Medicaid Other | Attending: Emergency Medicine | Admitting: Emergency Medicine

## 2014-11-08 ENCOUNTER — Encounter (HOSPITAL_COMMUNITY): Payer: Self-pay | Admitting: Adult Health

## 2014-11-08 DIAGNOSIS — Z79899 Other long term (current) drug therapy: Secondary | ICD-10-CM | POA: Insufficient documentation

## 2014-11-08 DIAGNOSIS — F42 Obsessive-compulsive disorder: Secondary | ICD-10-CM | POA: Insufficient documentation

## 2014-11-08 DIAGNOSIS — F419 Anxiety disorder, unspecified: Secondary | ICD-10-CM | POA: Insufficient documentation

## 2014-11-08 DIAGNOSIS — L02415 Cutaneous abscess of right lower limb: Secondary | ICD-10-CM | POA: Insufficient documentation

## 2014-11-08 DIAGNOSIS — F319 Bipolar disorder, unspecified: Secondary | ICD-10-CM | POA: Diagnosis not present

## 2014-11-08 DIAGNOSIS — L03115 Cellulitis of right lower limb: Secondary | ICD-10-CM | POA: Diagnosis present

## 2014-11-08 DIAGNOSIS — L02611 Cutaneous abscess of right foot: Secondary | ICD-10-CM

## 2014-11-08 MED ORDER — CLINDAMYCIN HCL 150 MG PO CAPS: 450.0000 mg | ORAL_CAPSULE | Freq: Three times a day (TID) | ORAL | Status: DC

## 2014-11-08 MED ORDER — HYDROCODONE-ACETAMINOPHEN 5-325 MG PO TABS: 1.0000 | ORAL_TABLET | Freq: Four times a day (QID) | ORAL | Status: DC | PRN

## 2014-11-08 MED ORDER — HYDROCODONE-ACETAMINOPHEN 5-325 MG PO TABS
2.0000 | ORAL_TABLET | Freq: Once | ORAL | Status: AC
  Administered 2014-11-08: 2 via ORAL
  Filled 2014-11-08: qty 2

## 2014-11-08 MED ORDER — LIDOCAINE HCL (PF) 1 % IJ SOLN
5.0000 mL | Freq: Once | INTRAMUSCULAR | Status: AC
  Administered 2014-11-08: 5 mL
  Filled 2014-11-08: qty 5

## 2014-11-08 MED ORDER — CLINDAMYCIN HCL 300 MG PO CAPS
450.0000 mg | ORAL_CAPSULE | Freq: Once | ORAL | Status: AC
  Administered 2014-11-08: 450 mg via ORAL
  Filled 2014-11-08: qty 1

## 2014-11-08 NOTE — ED Provider Notes (Addendum)
CSN: [id number]     Arrival date & time 11/08/14  2142 History   First MD Initiated Contact with Patient 11/08/14 2159     Chief Complaint  Patient presents with  . Cellulitis     (Consider location/radiation/quality/duration/timing/severity/associated sxs/prior Treatment) Patient is a 23 y.o. female presenting with abscess. The history is provided by the patient. No language interpreter was used.  Abscess Location:  Foot and finger Foot abscess location:  R toes (Dorsal surface of right small toe) Abscess quality: fluctuance, painful, redness and warmth   Red streaking: yes   Duration:  2 days Progression:  Worsening Pain details:    Severity:  Mild   Duration:  2 days   Timing:  Intermittent (Only hurts when walking or putting on a shoe) Chronicity:  New Context: skin injury (blisters due to shoes)   Context: not diabetes and not insect bite/sting   Relieved by:  Nothing Worsened by:  Nothing tried Ineffective treatments:  None tried Associated symptoms: no anorexia, no fatigue, no fever, no headaches, no nausea and no vomiting   Risk factors: no prior abscess     Past Medical History  Diagnosis Date  . Psychiatric diagnosis   . Chronic bipolar disorder   . OCD (obsessive compulsive disorder)   . H/O suicide attempt 05-2013    OD  . Anxiety   . Depression    History reviewed. No pertinent past surgical history. History reviewed. No pertinent family history. History  Substance Use Topics  . Smoking status: Never Smoker   . Smokeless tobacco: Not on file  . Alcohol Use: No   OB History    No data available     Review of Systems  Constitutional: Negative for fever, chills, diaphoresis, activity change, appetite change and fatigue.  HENT: Negative for congestion, facial swelling, rhinorrhea and sore throat.   Eyes: Negative for photophobia and discharge.  Respiratory: Negative for cough, chest tightness and shortness of breath.   Cardiovascular: Negative for  chest pain, palpitations and leg swelling.  Gastrointestinal: Negative for nausea, vomiting, abdominal pain, diarrhea and anorexia.  Endocrine: Negative for polydipsia and polyuria.  Genitourinary: Negative for dysuria, frequency, difficulty urinating and pelvic pain.  Musculoskeletal: Negative for back pain, arthralgias, neck pain and neck stiffness.  Skin: Negative for color change and wound.  Allergic/Immunologic: Negative for immunocompromised state.  Neurological: Negative for facial asymmetry, weakness, numbness and headaches.  Hematological: Does not bruise/bleed easily.  Psychiatric/Behavioral: Negative for confusion and agitation.      Allergies  Review of patient's allergies indicates no known allergies.  Home Medications   Prior to Admission medications   Medication Sig Start Date End Date Taking? Authorizing Provider  docusate sodium (COLACE) 100 MG capsule Take 200 mg by mouth 2 (two) times daily.   Yes Historical Provider, MD  lithium carbonate (ESKALITH) 450 MG CR tablet Take 450 mg by mouth 2 (two) times daily.   Yes Historical Provider, MD  LORazepam (ATIVAN) 1 MG tablet Take 1 mg by mouth at bedtime.   Yes Historical Provider, MD  risperiDONE (RISPERDAL) 2 MG tablet Take 2-4 mg by mouth 2 (two) times daily. Take 1 tab in the morning and 2 tabs at bedtime   Yes Historical Provider, MD  risperiDONE microspheres (RISPERDAL CONSTA) 37.5 MG injection Inject 37.5 mg into the muscle every 14 (fourteen) days.   Yes Historical Provider, MD  benztropine (COGENTIN) 0.5 MG tablet Take 1 tablet (0.5 mg total) by mouth 2 (two) times daily. For  involuntary movements brought on by medication Patient not taking: Reported on 11/08/2014 09/14/14   [facility], NP  clindamycin (CLEOCIN) 150 MG capsule Take 3 capsules (450 mg total) by mouth 3 (three) times daily. For 10 days 11/08/14   Toy Cookey, MD  HYDROcodone-acetaminophen (NORCO) 5-325 MG per tablet Take 1 tablet by mouth  every 6 (six) hours as needed for severe pain. 11/08/14   Toy Cookey, MD  ziprasidone (GEODON) 40 MG capsule Take 1 capsule (40 mg total) by mouth 2 (two) times daily with a meal. Patient not taking: Reported on 11/08/2014 10/23/14   Everardo All Withrow, FNP   BP 104/69 mmHg  Pulse 86  Temp(Src) 98.6 F (37 C) (Oral)  Resp 18  SpO2 100%  LMP  (LMP Unknown) Physical Exam  Constitutional: She is oriented to person, place, and time. She appears well-developed and well-nourished. No distress.  HENT:  Head: Normocephalic and atraumatic.  Mouth/Throat: No oropharyngeal exudate.  Eyes: Pupils are equal, round, and reactive to light.  Neck: Normal range of motion. Neck supple.  Cardiovascular: Normal rate, regular rhythm and normal heart sounds.  Exam reveals no gallop and no friction rub.   No murmur heard. Pulmonary/Chest: Effort normal and breath sounds normal. No respiratory distress. She has no wheezes. She has no rales.  Abdominal: Soft. Bowel sounds are normal. She exhibits no distension and no mass. There is no tenderness. There is no rebound and no guarding.  Musculoskeletal: Normal range of motion. She exhibits no edema or tenderness.       Feet:  Neurological: She is alert and oriented to person, place, and time.  Skin: Skin is warm and dry.  Psychiatric: She has a normal mood and affect.    ED Course  INCISION AND DRAINAGE Date/Time: 11/08/2014 11:48 AM Performed by: Toy Cookey Authorized by: Toy Cookey Consent: Verbal consent obtained. Written consent not obtained. Risks and benefits: risks, benefits and alternatives were discussed Consent given by: patient Patient understanding: patient states understanding of the procedure being performed Patient consent: the patient's understanding of the procedure matches consent given Procedure consent: procedure consent matches procedure scheduled Relevant documents: relevant documents present and verified Test results:  test results available and properly labeled Site marked: the operative site was marked Imaging studies: imaging studies available Required items: required blood products, implants, devices, and special equipment available Patient identity confirmed: verbally with patient Time out: Immediately prior to procedure a "time out" was called to verify the correct patient, procedure, equipment, support staff and site/side marked as required. Type: abscess Body area: lower extremity Location details: right little toe Anesthesia: digital block Local anesthetic: lidocaine 1% without epinephrine Anesthetic total: 2 ml Patient sedated: no Scalpel size: 11 Incision type: single straight Complexity: simple Drainage: purulent Drainage amount: copious Wound treatment: wound left open Patient tolerance: Patient tolerated the procedure well with no immediate complications   (including critical care time) Labs Review Labs Reviewed - No data to display  Imaging Review No results found.   EKG Interpretation None      MDM   Final diagnoses:  Abscess of right foot including toes  Cellulitis of right foot    Pt is a 23 y.o. female with Pmhx as above who presents with edema, spreading erythema, warmth, and development of tender, fluctuant area on the dorsal surface of right great toe over the past 2 days.  She was just discharged from [facility] yesterday for acute psychiatric illness.  Family states that  she had been wearing canvas shoes and had been doing a lot of pacing.  They think this caused blisters to her bilateral small toes.  This blister likely lead to the development of cellulitis and abscess.  She now has almost toe.  She is otherwise well appearing.  Patient given 1 dose of by mouth clindamycin.  Erythema has been marked.  I&D done as above.  We'll discharge home on by mouth clindamycin and close outpatient follow-up with PCP in 2 days for wound  recheck.     Nolon Lennert evaluation in the Emergency Department is complete. It has been determined that no acute conditions requiring further emergency intervention are present at this time. The patient/guardian have been advised of the diagnosis and plan. We have discussed signs and symptoms that warrant return to the ED, such as changes or worsening in symptoms, worsening redness, swelling, fever, red streaking.      Toy Cookey, MD 11/08/14 4650  Toy Cookey, MD 11/15/14 1150

## 2014-11-08 NOTE — ED Notes (Addendum)
Discharged from [facility] yesterday, began having foot swelling and wounds to both little toes. Redness and swelling is worse on right foot extending into ankle. Pt denies pain, only pain with ambulation and wearing shoes. Cms intact. She has been placed on new psychiatric medications this week as well.  Unknown how long they have been there but she has been in the hopsital since Nov. 25th and when she went into the hospital her feet were normal, not swollen and had no redness or wounds.

## 2014-11-08 NOTE — Discharge Instructions (Signed)
Abscess °An abscess (boil or furuncle) is an infected area on or under the skin. This area is filled with yellowish-white fluid (pus) and other material (debris). °HOME CARE  °· Only take medicines as told by your doctor. °· If you were given antibiotic medicine, take it as directed. Finish the medicine even if you start to feel better. °· If gauze is used, follow your doctor's directions for changing the gauze. °· To avoid spreading the infection: °¨ Keep your abscess covered with a bandage. °¨ Wash your hands well. °¨ Do not share personal care items, towels, or whirlpools with others. °¨ Avoid skin contact with others. °· Keep your skin and clothes clean around the abscess. °· Keep all doctor visits as told. °GET HELP RIGHT AWAY IF:  °· You have more pain, puffiness (swelling), or redness in the wound site. °· You have more fluid or blood coming from the wound site. °· You have muscle aches, chills, or you feel sick. °· You have a fever. °MAKE SURE YOU:  °· Understand these instructions. °· Will watch your condition. °· Will get help right away if you are not doing well or get worse. °Document Released: 05/04/2008 Document Revised: 05/17/2012 Document Reviewed: 01/29/2012 °Exit[employer]® Patient Information ©2015 [employer]. This information is not intended to replace advice given to you by your health care provider. Make sure you discuss any questions you have with your health care provider. ° °Cellulitis °Cellulitis is an infection of the skin and the tissue beneath it. The infected area is usually red and tender. Cellulitis occurs most often in the arms and lower legs.  °CAUSES  °Cellulitis is caused by bacteria that enter the skin through cracks or cuts in the skin. The most common types of bacteria that cause cellulitis are staphylococci and streptococci. °SIGNS AND SYMPTOMS  °· Redness and warmth. °· Swelling. °· Tenderness or pain. °· Fever. °DIAGNOSIS  °Your health care provider can usually determine what  is wrong based on a physical exam. Blood tests may also be done. °TREATMENT  °Treatment usually involves taking an antibiotic medicine. °HOME CARE INSTRUCTIONS  °· Take your antibiotic medicine as directed by your health care provider. Finish the antibiotic even if you start to feel better. °· Keep the infected arm or leg elevated to reduce swelling. °· Apply a warm cloth to the affected area up to 4 times per day to relieve pain. °· Take medicines only as directed by your health care provider. °· Keep all follow-up visits as directed by your health care provider. °SEEK MEDICAL CARE IF:  °· You notice red streaks coming from the infected area. °· Your red area gets larger or turns dark in color. °· Your bone or joint underneath the infected area becomes painful after the skin has healed. °· Your infection returns in the same area or another area. °· You notice a swollen bump in the infected area. °· You develop new symptoms. °· You have a fever. °SEEK IMMEDIATE MEDICAL CARE IF:  °· You feel very sleepy. °· You develop vomiting or diarrhea. °· You have a general ill feeling (malaise) with muscle aches and pains. °MAKE SURE YOU:  °· Understand these instructions. °· Will watch your condition. °· Will get help right away if you are not doing well or get worse. °Document Released: 08/26/2005 Document Revised: 04/02/2014 Document Reviewed: 02/01/2012 °ExitCare® Patient Information ©2015 [employer]. This information is not intended to replace advice given to you by your health care provider. Make sure   you discuss any questions you have with your health care provider. ° °

## 2014-11-08 NOTE — ED Notes (Signed)
Dr. Micheline Maze at the bedside. I&D tray along with betadine provided.

## 2014-11-10 ENCOUNTER — Emergency Department (HOSPITAL_COMMUNITY)
Admission: EM | Admit: 2014-11-10 | Discharge: 2014-11-10 | Disposition: A | Discharge status: Home / Self Care | Payer: Medicaid Other | Attending: Emergency Medicine | Admitting: Emergency Medicine

## 2014-11-10 ENCOUNTER — Encounter (HOSPITAL_COMMUNITY): Payer: Self-pay | Admitting: *Deleted

## 2014-11-10 DIAGNOSIS — Z5189 Encounter for other specified aftercare: Secondary | ICD-10-CM

## 2014-11-10 DIAGNOSIS — Z79899 Other long term (current) drug therapy: Secondary | ICD-10-CM | POA: Insufficient documentation

## 2014-11-10 DIAGNOSIS — F319 Bipolar disorder, unspecified: Secondary | ICD-10-CM | POA: Diagnosis not present

## 2014-11-10 DIAGNOSIS — F42 Obsessive-compulsive disorder: Secondary | ICD-10-CM | POA: Insufficient documentation

## 2014-11-10 DIAGNOSIS — Z4801 Encounter for change or removal of surgical wound dressing: Secondary | ICD-10-CM | POA: Insufficient documentation

## 2014-11-10 DIAGNOSIS — F419 Anxiety disorder, unspecified: Secondary | ICD-10-CM | POA: Insufficient documentation

## 2014-11-10 DIAGNOSIS — Z792 Long term (current) use of antibiotics: Secondary | ICD-10-CM | POA: Diagnosis not present

## 2014-11-10 NOTE — Discharge Instructions (Signed)
Return to the emergency room with worsening of symptoms, new symptoms or with symptoms that are concerning, especially fevers, increased redness, swelling, increased pus from lesion, significant pain, red streaks from the area. Continue with salt water soaks 3 times a day to reduce inflammation. Continue take antibiotics as prescribed. Please take all of your antibiotics until finished!   You may develop abdominal discomfort or diarrhea from the antibiotic.  You may help offset this with probiotics which you can buy or get in yogurt. Do not eat  or take the probiotics until 2 hours after your antibiotic.  Follow-up with primary care provider as scheduled.

## 2014-11-10 NOTE — ED Notes (Signed)
Foot soaked as ordered

## 2014-11-10 NOTE — ED Notes (Signed)
Declined W/C at D/C and was escorted to lobby by RN. 

## 2014-11-10 NOTE — ED Provider Notes (Signed)
CSN: [id number]     Arrival date & time 11/10/14  0931 History  This chart was scribed for non-physician practitioner, Karmen Stabs, PA-C working with Gerhard Munch, MD, by Jarvis Morgan, ED Scribe. This patient was seen in room TR11C/TR11C and the patient's care was started at 10:31 AM.    Chief Complaint  Patient presents with  . Wound Check    The history is provided by the patient and a parent. No language interpreter was used.    HPI Comments: Natalie [name] is a 23 y.o. female who presents to the Emergency Department for a recheck of her abscess that was incised and drained 2 days ago. She denies any pain at this time. She was prescribed Clindamycin and has been taking it regularly. She states there has been very minimal drainage from the area. Her father states there was some swelling yesterday. Pt has not h/o DM. She denies any fever, nausea, vomiting, anorexia, fatigue.      Past Medical History  Diagnosis Date  . Psychiatric diagnosis   . Chronic bipolar disorder   . OCD (obsessive compulsive disorder)   . H/O suicide attempt 05-2013    OD  . Anxiety   . Depression    History reviewed. No pertinent past surgical history. History reviewed. No pertinent family history. History  Substance Use Topics  . Smoking status: Never Smoker   . Smokeless tobacco: Not on file  . Alcohol Use: No   OB History    No data available     Review of Systems  Constitutional: Negative for fever, appetite change and fatigue.  Gastrointestinal: Negative for nausea and vomiting.  Skin: Positive for color change (small amount due to I&D of abscess) and wound (from I&D 2 days ago).      Allergies  Review of patient's allergies indicates no known allergies.  Home Medications   Prior to Admission medications   Medication Sig Start Date End Date Taking? Authorizing Provider  benztropine (COGENTIN) 0.5 MG tablet Take 1 tablet (0.5 mg total) by mouth 2 (two) times daily. For  involuntary movements brought on by medication Patient not taking: Reported on 11/08/2014 09/14/14   [facility], NP  clindamycin (CLEOCIN) 150 MG capsule Take 3 capsules (450 mg total) by mouth 3 (three) times daily. For 10 days 11/08/14   Toy Cookey, MD  docusate sodium (COLACE) 100 MG capsule Take 200 mg by mouth 2 (two) times daily.    Historical Provider, MD  HYDROcodone-acetaminophen (NORCO) 5-325 MG per tablet Take 1 tablet by mouth every 6 (six) hours as needed for severe pain. 11/08/14   Toy Cookey, MD  lithium carbonate (ESKALITH) 450 MG CR tablet Take 450 mg by mouth 2 (two) times daily.    Historical Provider, MD  LORazepam (ATIVAN) 1 MG tablet Take 1 mg by mouth at bedtime.    Historical Provider, MD  risperiDONE (RISPERDAL) 2 MG tablet Take 2-4 mg by mouth 2 (two) times daily. Take 1 tab in the morning and 2 tabs at bedtime    Historical Provider, MD  risperiDONE microspheres (RISPERDAL CONSTA) 37.5 MG injection Inject 37.5 mg into the muscle every 14 (fourteen) days.    Historical Provider, MD  ziprasidone (GEODON) 40 MG capsule Take 1 capsule (40 mg total) by mouth 2 (two) times daily with a meal. Patient not taking: Reported on 11/08/2014 10/23/14   Beau Fanny, FNP   Triage Vitals: BP 113/63 mmHg  Pulse 88  Temp(Src) 98.2 F (36.8  C) (Oral)  Resp 18  SpO2 100%  LMP  (LMP Unknown)  Physical Exam  Constitutional: She appears well-developed and well-nourished. No distress.  HENT:  Head: Normocephalic and atraumatic.  Eyes: Conjunctivae are normal. Right eye exhibits no discharge. Left eye exhibits no discharge.  Pulmonary/Chest: Effort normal. No respiratory distress.  Musculoskeletal:  Pt able to move toe with intact sensation and cap refill  Neurological: She is alert. Coordination normal.  Skin: She is not diaphoretic. There is erythema.  Decreased area of erythema from prior skin markation. Minimal amount of perulent discharge, no swelling or abscess   Psychiatric: She has a normal mood and affect. Her behavior is normal.  Nursing note and vitals reviewed.   ED Course  Procedures (including critical care time)  DIAGNOSTIC STUDIES: Oxygen Saturation is 100% on RA, normal by my interpretation.    COORDINATION OF CARE:    Labs Review Labs Reviewed - No data to display  Imaging Review No results found.   EKG Interpretation None      MDM   Final diagnoses:  Encounter for wound re-check   Patient presenting after abscess with IND for wound recheck. Wound with decreased erythema. Only small amount of purulent discharge. Neurovascularly intact. Patient cellulitis improving. No systemic signs or symptoms. Patient to continue with antibiotic treatment as well as warm soaks. Patient with primary care follow-up scheduled otherwise strict return precautions provided for worsening infection. Patient verbalizes understanding and agrees with plan.  I personally performed the services described in this documentation, which was scribed in my presence. The recorded information has been reviewed and is accurate.   Louann Sjogren, PA-C 11/10/14 [address] [place] 11/10/14 1033  Gerhard Munch, MD 11/10/14 [phone]

## 2014-11-10 NOTE — ED Notes (Signed)
PT presents to ED today for wound recheck on Rt little toe.

## 2015-03-04 ENCOUNTER — Other Ambulatory Visit (HOSPITAL_COMMUNITY)
Admission: RE | Admit: 2015-03-04 | Discharge: 2015-03-04 | Disposition: A | Discharge status: Home / Self Care | Payer: Medicaid Other | Source: Ambulatory Visit | Attending: Obstetrics and Gynecology | Admitting: Obstetrics and Gynecology

## 2015-03-04 ENCOUNTER — Encounter: Payer: Self-pay | Admitting: Obstetrics and Gynecology

## 2015-03-04 ENCOUNTER — Ambulatory Visit (INDEPENDENT_AMBULATORY_CARE_PROVIDER_SITE_OTHER): Payer: Medicaid Other | Admitting: Obstetrics and Gynecology

## 2015-03-04 VITALS — BP 109/71 | HR 93 | Ht 64.0 in | Wt 216.1 lb

## 2015-03-04 DIAGNOSIS — Z118 Encounter for screening for other infectious and parasitic diseases: Secondary | ICD-10-CM | POA: Diagnosis not present

## 2015-03-04 DIAGNOSIS — Z3202 Encounter for pregnancy test, result negative: Secondary | ICD-10-CM

## 2015-03-04 DIAGNOSIS — Z01419 Encounter for gynecological examination (general) (routine) without abnormal findings: Secondary | ICD-10-CM | POA: Diagnosis not present

## 2015-03-04 DIAGNOSIS — Z01812 Encounter for preprocedural laboratory examination: Secondary | ICD-10-CM | POA: Diagnosis not present

## 2015-03-04 DIAGNOSIS — Z113 Encounter for screening for infections with a predominantly sexual mode of transmission: Secondary | ICD-10-CM | POA: Insufficient documentation

## 2015-03-04 DIAGNOSIS — Z1151 Encounter for screening for human papillomavirus (HPV): Secondary | ICD-10-CM | POA: Diagnosis not present

## 2015-03-04 DIAGNOSIS — Z124 Encounter for screening for malignant neoplasm of cervix: Secondary | ICD-10-CM

## 2015-03-04 DIAGNOSIS — Z30430 Encounter for insertion of intrauterine contraceptive device: Secondary | ICD-10-CM | POA: Diagnosis not present

## 2015-03-04 LAB — POCT PREGNANCY, URINE: Preg Test, Ur: NEGATIVE

## 2015-03-04 MED ORDER — LEVONORGESTREL 20 MCG/24HR IU IUD
INTRAUTERINE_SYSTEM | Freq: Once | INTRAUTERINE | Status: AC
  Administered 2015-03-04: 1 via INTRAUTERINE

## 2015-03-04 NOTE — Patient Instructions (Signed)
Levonorgestrel intrauterine device (IUD) What is this medicine? LEVONORGESTREL IUD (LEE voe nor jes trel) is a contraceptive (birth control) device. The device is placed inside the uterus by a healthcare professional. It is used to prevent pregnancy and can also be used to treat heavy bleeding that occurs during your period. Depending on the device, it can be used for 3 to 5 years. This medicine may be used for other purposes; ask your health care provider or pharmacist if you have questions. COMMON BRAND NAME(S): Verda Cumins What should I tell my health care provider before I take this medicine? They need to know if you have any of these conditions: -abnormal Pap smear -cancer of the breast, uterus, or cervix -diabetes -endometritis -genital or pelvic infection now or in the past -have more than one sexual partner or your partner has more than one partner -heart disease -history of an ectopic or tubal pregnancy -immune system problems -IUD in place -liver disease or tumor -problems with blood clots or take blood-thinners -use intravenous drugs -uterus of unusual shape -vaginal bleeding that has not been explained -an unusual or allergic reaction to levonorgestrel, other hormones, silicone, or polyethylene, medicines, foods, dyes, or preservatives -pregnant or trying to get pregnant -breast-feeding How should I use this medicine? This device is placed inside the uterus by a health care professional. Talk to your pediatrician regarding the use of this medicine in children. Special care may be needed. Overdosage: If you think you have taken too much of this medicine contact a poison control center or emergency room at once. NOTE: This medicine is only for you. Do not share this medicine with others. What if I miss a dose? This does not apply. What may interact with this medicine? Do not take this medicine with any of the following  medications: -amprenavir -bosentan -fosamprenavir This medicine may also interact with the following medications: -aprepitant -barbiturate medicines for inducing sleep or treating seizures -bexarotene -griseofulvin -medicines to treat seizures like carbamazepine, ethotoin, felbamate, oxcarbazepine, phenytoin, topiramate -modafinil -pioglitazone -rifabutin -rifampin -rifapentine -some medicines to treat HIV infection like atazanavir, indinavir, lopinavir, nelfinavir, tipranavir, ritonavir -St. John's wort -warfarin This list may not describe all possible interactions. Give your health care provider a list of all the medicines, herbs, non-prescription drugs, or dietary supplements you use. Also tell them if you smoke, drink alcohol, or use illegal drugs. Some items may interact with your medicine. What should I watch for while using this medicine? Visit your doctor or health care professional for regular check ups. See your doctor if you or your partner has sexual contact with others, becomes HIV positive, or gets a sexual transmitted disease. This product does not protect you against HIV infection (AIDS) or other sexually transmitted diseases. You can check the placement of the IUD yourself by reaching up to the top of your vagina with clean fingers to feel the threads. Do not pull on the threads. It is a good habit to check placement after each menstrual period. Call your doctor right away if you feel more of the IUD than just the threads or if you cannot feel the threads at all. The IUD may come out by itself. You may become pregnant if the device comes out. If you notice that the IUD has come out use a backup birth control method like condoms and call your health care provider. Using tampons will not change the position of the IUD and are okay to use during your period. What side effects may  I notice from receiving this medicine? Side effects that you should report to your doctor or  health care professional as soon as possible: -allergic reactions like skin rash, itching or hives, swelling of the face, lips, or tongue -fever, flu-like symptoms -genital sores -high blood pressure -no menstrual period for 6 weeks during use -pain, swelling, warmth in the leg -pelvic pain or tenderness -severe or sudden headache -signs of pregnancy -stomach cramping -sudden shortness of breath -trouble with balance, talking, or walking -unusual vaginal bleeding, discharge -yellowing of the eyes or skin Side effects that usually do not require medical attention (report to your doctor or health care professional if they continue or are bothersome): -acne -breast pain -change in sex drive or performance -changes in weight -cramping, dizziness, or faintness while the device is being inserted -headache -irregular menstrual bleeding within first 3 to 6 months of use -nausea This list may not describe all possible side effects. Call your doctor for medical advice about side effects. You may report side effects to FDA at 1-800-FDA-1088. Where should I keep my medicine? This does not apply. NOTE: This sheet is a summary. It may not cover all possible information. If you have questions about this medicine, talk to your doctor, pharmacist, or health care provider.  2015, [employer] Standard. (2011-12-17 13:54:04)   Preventive Care for Adults A healthy lifestyle and preventive care can promote health and wellness. Preventive health guidelines for women include the following key practices.  A routine yearly physical is a good way to check with your health care provider about your health and preventive screening. It is a chance to share any concerns and updates on your health and to receive a thorough exam.  Visit your dentist for a routine exam and preventive care every 6 months. Brush your teeth twice a day and floss once a day. Good oral hygiene prevents tooth decay and gum  disease.  The frequency of eye exams is based on your age, health, family medical history, use of contact lenses, and other factors. Follow your health care provider's recommendations for frequency of eye exams.  Eat a healthy diet. Foods like vegetables, fruits, whole grains, low-fat dairy products, and lean protein foods contain the nutrients you need without too many calories. Decrease your intake of foods high in solid fats, added sugars, and salt. Eat the right amount of calories for you.Get information about a proper diet from your health care provider, if necessary.  Regular physical exercise is one of the most important things you can do for your health. Most adults should get at least 150 minutes of moderate-intensity exercise (any activity that increases your heart rate and causes you to sweat) each week. In addition, most adults need muscle-strengthening exercises on 2 or more days a week.  Maintain a healthy weight. The body mass index (BMI) is a screening tool to identify possible weight problems. It provides an estimate of body fat based on height and weight. Your health care provider can find your BMI and can help you achieve or maintain a healthy weight.For adults 20 years and older:  A BMI below 18.5 is considered underweight.  A BMI of 18.5 to 24.9 is normal.  A BMI of 25 to 29.9 is considered overweight.  A BMI of 30 and above is considered obese.  Maintain normal blood lipids and cholesterol levels by exercising and minimizing your intake of saturated fat. Eat a balanced diet with plenty of fruit and vegetables. Blood tests for lipids and cholesterol  should begin at age 71 and be repeated every 5 years. If your lipid or cholesterol levels are high, you are over 50, or you are at high risk for heart disease, you may need your cholesterol levels checked more frequently.Ongoing high lipid and cholesterol levels should be treated with medicines if diet and exercise are not  working.  If you smoke, find out from your health care provider how to quit. If you do not use tobacco, do not start.  Lung cancer screening is recommended for adults aged 39-80 years who are at high risk for developing lung cancer because of a history of smoking. A yearly low-dose CT scan of the lungs is recommended for people who have at least a 30-pack-year history of smoking and are a current smoker or have quit within the past 15 years. A pack year of smoking is smoking an average of 1 pack of cigarettes a day for 1 year (for example: 1 pack a day for 30 years or 2 packs a day for 15 years). Yearly screening should continue until the smoker has stopped smoking for at least 15 years. Yearly screening should be stopped for people who develop a health problem that would prevent them from having lung cancer treatment.  If you are pregnant, do not drink alcohol. If you are breastfeeding, be very cautious about drinking alcohol. If you are not pregnant and choose to drink alcohol, do not have more than 1 drink per day. One drink is considered to be 12 ounces (355 mL) of beer, 5 ounces (148 mL) of wine, or 1.5 ounces (44 mL) of liquor.  Avoid use of street drugs. Do not share needles with anyone. Ask for help if you need support or instructions about stopping the use of drugs.  High blood pressure causes heart disease and increases the risk of stroke. Your blood pressure should be checked at least every 1 to 2 years. Ongoing high blood pressure should be treated with medicines if weight loss and exercise do not work.  If you are 21-16 years old, ask your health care provider if you should take aspirin to prevent strokes.  Diabetes screening involves taking a blood sample to check your fasting blood sugar level. This should be done once every 3 years, after age 75, if you are within normal weight and without risk factors for diabetes. Testing should be considered at a younger age or be carried out more  frequently if you are overweight and have at least 1 risk factor for diabetes.  Breast cancer screening is essential preventive care for women. You should practice "breast self-awareness." This means understanding the normal appearance and feel of your breasts and may include breast self-examination. Any changes detected, no matter how small, should be reported to a health care provider. Women in their 58s and 30s should have a clinical breast exam (CBE) by a health care provider as part of a regular health exam every 1 to 3 years. After age 1, women should have a CBE every year. Starting at age 24, women should consider having a mammogram (breast X-ray test) every year. Women who have a family history of breast cancer should talk to their health care provider about genetic screening. Women at a high risk of breast cancer should talk to their health care providers about having an MRI and a mammogram every year.  Breast cancer gene (BRCA)-related cancer risk assessment is recommended for women who have family members with BRCA-related cancers. BRCA-related cancers include breast,  ovarian, tubal, and peritoneal cancers. Having family members with these cancers may be associated with an increased risk for harmful changes (mutations) in the breast cancer genes BRCA1 and BRCA2. Results of the assessment will determine the need for genetic counseling and BRCA1 and BRCA2 testing.  Routine pelvic exams to screen for cancer are no longer recommended for nonpregnant women who are considered low risk for cancer of the pelvic organs (ovaries, uterus, and vagina) and who do not have symptoms. Ask your health care provider if a screening pelvic exam is right for you.  If you have had past treatment for cervical cancer or a condition that could lead to cancer, you need Pap tests and screening for cancer for at least 20 years after your treatment. If Pap tests have been discontinued, your risk factors (such as having a new  sexual partner) need to be reassessed to determine if screening should be resumed. Some women have medical problems that increase the chance of getting cervical cancer. In these cases, your health care provider may recommend more frequent screening and Pap tests.  The HPV test is an additional test that may be used for cervical cancer screening. The HPV test looks for the virus that can cause the cell changes on the cervix. The cells collected during the Pap test can be tested for HPV. The HPV test could be used to screen women aged 16 years and older, and should be used in women of any age who have unclear Pap test results. After the age of 65, women should have HPV testing at the same frequency as a Pap test.  Colorectal cancer can be detected and often prevented. Most routine colorectal cancer screening begins at the age of 87 years and continues through age 27 years. However, your health care provider may recommend screening at an earlier age if you have risk factors for colon cancer. On a yearly basis, your health care provider may provide home test kits to check for hidden blood in the stool. Use of a small camera at the end of a tube, to directly examine the colon (sigmoidoscopy or colonoscopy), can detect the earliest forms of colorectal cancer. Talk to your health care provider about this at age 44, when routine screening begins. Direct exam of the colon should be repeated every 5-10 years through age 66 years, unless early forms of pre-cancerous polyps or small growths are found.  People who are at an increased risk for hepatitis B should be screened for this virus. You are considered at high risk for hepatitis B if:  You were born in a country where hepatitis B occurs often. Talk with your health care provider about which countries are considered high risk.  Your parents were born in a high-risk country and you have not received a shot to protect against hepatitis B (hepatitis B  vaccine).  You have HIV or AIDS.  You use needles to inject street drugs.  You live with, or have sex with, someone who has hepatitis B.  You get hemodialysis treatment.  You take certain medicines for conditions like cancer, organ transplantation, and autoimmune conditions.  Hepatitis C blood testing is recommended for all people born from [birth year] through 1966 and any individual with known risks for hepatitis C.  Practice safe sex. Use condoms and avoid high-risk sexual practices to reduce the spread of sexually transmitted infections (STIs). STIs include gonorrhea, chlamydia, syphilis, trichomonas, herpes, HPV, and human immunodeficiency virus (HIV). Herpes, HIV, and HPV are viral illnesses  that have no cure. They can result in disability, cancer, and death.  You should be screened for sexually transmitted illnesses (STIs) including gonorrhea and chlamydia if:  You are sexually active and are younger than 24 years.  You are older than 24 years and your health care provider tells you that you are at risk for this type of infection.  Your sexual activity has changed since you were last screened and you are at an increased risk for chlamydia or gonorrhea. Ask your health care provider if you are at risk.  If you are at risk of being infected with HIV, it is recommended that you take a prescription medicine daily to prevent HIV infection. This is called preexposure prophylaxis (PrEP). You are considered at risk if:  You are a heterosexual woman, are sexually active, and are at increased risk for HIV infection.  You take drugs by injection.  You are sexually active with a partner who has HIV.  Talk with your health care provider about whether you are at high risk of being infected with HIV. If you choose to begin PrEP, you should first be tested for HIV. You should then be tested every 3 months for as long as you are taking PrEP.  Osteoporosis is a disease in which the bones lose minerals  and strength with aging. This can result in serious bone fractures or breaks. The risk of osteoporosis can be identified using a bone density scan. Women ages 81 years and over and women at risk for fractures or osteoporosis should discuss screening with their health care providers. Ask your health care provider whether you should take a calcium supplement or vitamin D to reduce the rate of osteoporosis.  Menopause can be associated with physical symptoms and risks. Hormone replacement therapy is available to decrease symptoms and risks. You should talk to your health care provider about whether hormone replacement therapy is right for you.  Use sunscreen. Apply sunscreen liberally and repeatedly throughout the day. You should seek shade when your shadow is shorter than you. Protect yourself by wearing long sleeves, pants, a wide-brimmed hat, and sunglasses year round, whenever you are outdoors.  Once a month, do a whole body skin exam, using a mirror to look at the skin on your back. Tell your health care provider of new moles, moles that have irregular borders, moles that are larger than a pencil eraser, or moles that have changed in shape or color.  Stay current with required vaccines (immunizations).  Influenza vaccine. All adults should be immunized every year.  Tetanus, diphtheria, and acellular pertussis (Td, Tdap) vaccine. Pregnant women should receive 1 dose of Tdap vaccine during each pregnancy. The dose should be obtained regardless of the length of time since the last dose. Immunization is preferred during the 27th-36th week of gestation. An adult who has not previously received Tdap or who does not know her vaccine status should receive 1 dose of Tdap. This initial dose should be followed by tetanus and diphtheria toxoids (Td) booster doses every 10 years. Adults with an unknown or incomplete history of completing a 3-dose immunization series with Td-containing vaccines should begin or  complete a primary immunization series including a Tdap dose. Adults should receive a Td booster every 10 years.  Varicella vaccine. An adult without evidence of immunity to varicella should receive 2 doses or a second dose if she has previously received 1 dose. Pregnant females who do not have evidence of immunity should receive the first dose  after pregnancy. This first dose should be obtained before leaving the health care facility. The second dose should be obtained 4-8 weeks after the first dose.  Human papillomavirus (HPV) vaccine. Females aged 13-26 years who have not received the vaccine previously should obtain the 3-dose series. The vaccine is not recommended for use in pregnant females. However, pregnancy testing is not needed before receiving a dose. If a female is found to be pregnant after receiving a dose, no treatment is needed. In that case, the remaining doses should be delayed until after the pregnancy. Immunization is recommended for any person with an immunocompromised condition through the age of 5 years if she did not get any or all doses earlier. During the 3-dose series, the second dose should be obtained 4-8 weeks after the first dose. The third dose should be obtained 24 weeks after the first dose and 16 weeks after the second dose.  Zoster vaccine. One dose is recommended for adults aged 100 years or older unless certain conditions are present.  Measles, mumps, and rubella (MMR) vaccine. Adults born before [birth year] generally are considered immune to measles and mumps. Adults born in [birth year] or later should have 1 or more doses of MMR vaccine unless there is a contraindication to the vaccine or there is laboratory evidence of immunity to each of the three diseases. A routine second dose of MMR vaccine should be obtained at least 28 days after the first dose for students attending postsecondary schools, health care workers, or international travelers. People who received inactivated  measles vaccine or an unknown type of measles vaccine during 1963-1967 should receive 2 doses of MMR vaccine. People who received inactivated mumps vaccine or an unknown type of mumps vaccine before 1979 and are at high risk for mumps infection should consider immunization with 2 doses of MMR vaccine. For females of childbearing age, rubella immunity should be determined. If there is no evidence of immunity, females who are not pregnant should be vaccinated. If there is no evidence of immunity, females who are pregnant should delay immunization until after pregnancy. Unvaccinated health care workers born before [birth year] who lack laboratory evidence of measles, mumps, or rubella immunity or laboratory confirmation of disease should consider measles and mumps immunization with 2 doses of MMR vaccine or rubella immunization with 1 dose of MMR vaccine.  Pneumococcal 13-valent conjugate (PCV13) vaccine. When indicated, a person who is uncertain of her immunization history and has no record of immunization should receive the PCV13 vaccine. An adult aged 63 years or older who has certain medical conditions and has not been previously immunized should receive 1 dose of PCV13 vaccine. This PCV13 should be followed with a dose of pneumococcal polysaccharide (PPSV23) vaccine. The PPSV23 vaccine dose should be obtained at least 8 weeks after the dose of PCV13 vaccine. An adult aged 57 years or older who has certain medical conditions and previously received 1 or more doses of PPSV23 vaccine should receive 1 dose of PCV13. The PCV13 vaccine dose should be obtained 1 or more years after the last PPSV23 vaccine dose.  Pneumococcal polysaccharide (PPSV23) vaccine. When PCV13 is also indicated, PCV13 should be obtained first. All adults aged 69 years and older should be immunized. An adult younger than age 61 years who has certain medical conditions should be immunized. Any person who resides in a nursing home or long-term care  facility should be immunized. An adult smoker should be immunized. People with an immunocompromised condition and certain other conditions should  receive both PCV13 and PPSV23 vaccines. People with human immunodeficiency virus (HIV) infection should be immunized as soon as possible after diagnosis. Immunization during chemotherapy or radiation therapy should be avoided. Routine use of PPSV23 vaccine is not recommended for American Indians, [place], or people younger than 65 years unless there are medical conditions that require PPSV23 vaccine. When indicated, people who have unknown immunization and have no record of immunization should receive PPSV23 vaccine. One-time revaccination 5 years after the first dose of PPSV23 is recommended for people aged 19-64 years who have chronic kidney failure, nephrotic syndrome, asplenia, or immunocompromised conditions. People who received 1-2 doses of PPSV23 before age 72 years should receive another dose of PPSV23 vaccine at age 46 years or later if at least 5 years have passed since the previous dose. Doses of PPSV23 are not needed for people immunized with PPSV23 at or after age 54 years.  Meningococcal vaccine. Adults with asplenia or persistent complement component deficiencies should receive 2 doses of quadrivalent meningococcal conjugate (MenACWY-D) vaccine. The doses should be obtained at least 2 months apart. Microbiologists working with certain meningococcal bacteria, [employer] recruits, people at risk during an outbreak, and people who travel to or live in countries with a high rate of meningitis should be immunized. A first-year college student up through age 27 years who is living in a residence hall should receive a dose if she did not receive a dose on or after her 16th birthday. Adults who have certain high-risk conditions should receive one or more doses of vaccine.  Hepatitis A vaccine. Adults who wish to be protected from this disease, have certain  high-risk conditions, work with hepatitis A-infected animals, work in hepatitis A research labs, or travel to or work in countries with a high rate of hepatitis A should be immunized. Adults who were previously unvaccinated and who anticipate close contact with an international adoptee during the first 60 days after arrival in the [place] from a country with a high rate of hepatitis A should be immunized.  Hepatitis B vaccine. Adults who wish to be protected from this disease, have certain high-risk conditions, may be exposed to blood or other infectious body fluids, are household contacts or sex partners of hepatitis B positive people, are clients or workers in certain care facilities, or travel to or work in countries with a high rate of hepatitis B should be immunized.  Haemophilus influenzae type b (Hib) vaccine. A previously unvaccinated person with asplenia or sickle cell disease or having a scheduled splenectomy should receive 1 dose of Hib vaccine. Regardless of previous immunization, a recipient of a hematopoietic stem cell transplant should receive a 3-dose series 6-12 months after her successful transplant. Hib vaccine is not recommended for adults with HIV infection. Preventive Services / Frequency Ages 81 to 47 years  Blood pressure check.** / Every 1 to 2 years.  Lipid and cholesterol check.** / Every 5 years beginning at age 40.  Clinical breast exam.** / Every 3 years for women in their 47s and 72s.  BRCA-related cancer risk assessment.** / For women who have family members with a BRCA-related cancer (breast, ovarian, tubal, or peritoneal cancers).  Pap test.** / Every 2 years from ages 78 through 56. Every 3 years starting at age 39 through age 51 or 66 with a history of 3 consecutive normal Pap tests.  HPV screening.** / Every 3 years from ages 35 through ages 21 to 59 with a history of 3 consecutive normal Pap  tests.  Hepatitis C blood test.** / For any individual with  known risks for hepatitis C.  Skin self-exam. / Monthly.  Influenza vaccine. / Every year.  Tetanus, diphtheria, and acellular pertussis (Tdap, Td) vaccine.** / Consult your health care provider. Pregnant women should receive 1 dose of Tdap vaccine during each pregnancy. 1 dose of Td every 10 years.  Varicella vaccine.** / Consult your health care provider. Pregnant females who do not have evidence of immunity should receive the first dose after pregnancy.  HPV vaccine. / 3 doses over 6 months, if 43 and younger. The vaccine is not recommended for use in pregnant females. However, pregnancy testing is not needed before receiving a dose.  Measles, mumps, rubella (MMR) vaccine.** / You need at least 1 dose of MMR if you were born in 1957 or later. You may also need a 2nd dose. For females of childbearing age, rubella immunity should be determined. If there is no evidence of immunity, females who are not pregnant should be vaccinated. If there is no evidence of immunity, females who are pregnant should delay immunization until after pregnancy.  Pneumococcal 13-valent conjugate (PCV13) vaccine.** / Consult your health care provider.  Pneumococcal polysaccharide (PPSV23) vaccine.** / 1 to 2 doses if you smoke cigarettes or if you have certain conditions.  Meningococcal vaccine.** / 1 dose if you are age 19 to 33 years and a Market researcher living in a residence hall, or have one of several medical conditions, you need to get vaccinated against meningococcal disease. You may also need additional booster doses.  Hepatitis A vaccine.** / Consult your health care provider.  Hepatitis B vaccine.** / Consult your health care provider.  Haemophilus influenzae type b (Hib) vaccine.** / Consult your health care provider. Ages 73 to 60 years  Blood pressure check.** / Every 1 to 2 years.  Lipid and cholesterol check.** / Every 5 years beginning at age 76 years.  Lung cancer screening. /  Every year if you are aged 33-80 years and have a 30-pack-year history of smoking and currently smoke or have quit within the past 15 years. Yearly screening is stopped once you have quit smoking for at least 15 years or develop a health problem that would prevent you from having lung cancer treatment.  Clinical breast exam.** / Every year after age 10 years.  BRCA-related cancer risk assessment.** / For women who have family members with a BRCA-related cancer (breast, ovarian, tubal, or peritoneal cancers).  Mammogram.** / Every year beginning at age 49 years and continuing for as long as you are in good health. Consult with your health care provider.  Pap test.** / Every 3 years starting at age 38 years through age 65 or 4 years with a history of 3 consecutive normal Pap tests.  HPV screening.** / Every 3 years from ages 17 years through ages 94 to 23 years with a history of 3 consecutive normal Pap tests.  Fecal occult blood test (FOBT) of stool. / Every year beginning at age 83 years and continuing until age 50 years. You may not need to do this test if you get a colonoscopy every 10 years.  Flexible sigmoidoscopy or colonoscopy.** / Every 5 years for a flexible sigmoidoscopy or every 10 years for a colonoscopy beginning at age 41 years and continuing until age 77 years.  Hepatitis C blood test.** / For all people born from [birth year] through 1966 and any individual with known risks for hepatitis C.  Skin self-exam. /  Monthly.  Influenza vaccine. / Every year.  Tetanus, diphtheria, and acellular pertussis (Tdap/Td) vaccine.** / Consult your health care provider. Pregnant women should receive 1 dose of Tdap vaccine during each pregnancy. 1 dose of Td every 10 years.  Varicella vaccine.** / Consult your health care provider. Pregnant females who do not have evidence of immunity should receive the first dose after pregnancy.  Zoster vaccine.** / 1 dose for adults aged 27 years or  older.  Measles, mumps, rubella (MMR) vaccine.** / You need at least 1 dose of MMR if you were born in 1957 or later. You may also need a 2nd dose. For females of childbearing age, rubella immunity should be determined. If there is no evidence of immunity, females who are not pregnant should be vaccinated. If there is no evidence of immunity, females who are pregnant should delay immunization until after pregnancy.  Pneumococcal 13-valent conjugate (PCV13) vaccine.** / Consult your health care provider.  Pneumococcal polysaccharide (PPSV23) vaccine.** / 1 to 2 doses if you smoke cigarettes or if you have certain conditions.  Meningococcal vaccine.** / Consult your health care provider.  Hepatitis A vaccine.** / Consult your health care provider.  Hepatitis B vaccine.** / Consult your health care provider.  Haemophilus influenzae type b (Hib) vaccine.** / Consult your health care provider. Ages 79 years and over  Blood pressure check.** / Every 1 to 2 years.  Lipid and cholesterol check.** / Every 5 years beginning at age 38 years.  Lung cancer screening. / Every year if you are aged 82-80 years and have a 30-pack-year history of smoking and currently smoke or have quit within the past 15 years. Yearly screening is stopped once you have quit smoking for at least 15 years or develop a health problem that would prevent you from having lung cancer treatment.  Clinical breast exam.** / Every year after age 20 years.  BRCA-related cancer risk assessment.** / For women who have family members with a BRCA-related cancer (breast, ovarian, tubal, or peritoneal cancers).  Mammogram.** / Every year beginning at age 74 years and continuing for as long as you are in good health. Consult with your health care provider.  Pap test.** / Every 3 years starting at age 28 years through age 59 or 21 years with 3 consecutive normal Pap tests. Testing can be stopped between 65 and 70 years with 3 consecutive  normal Pap tests and no abnormal Pap or HPV tests in the past 10 years.  HPV screening.** / Every 3 years from ages 60 years through ages 50 or 39 years with a history of 3 consecutive normal Pap tests. Testing can be stopped between 65 and 70 years with 3 consecutive normal Pap tests and no abnormal Pap or HPV tests in the past 10 years.  Fecal occult blood test (FOBT) of stool. / Every year beginning at age 51 years and continuing until age 66 years. You may not need to do this test if you get a colonoscopy every 10 years.  Flexible sigmoidoscopy or colonoscopy.** / Every 5 years for a flexible sigmoidoscopy or every 10 years for a colonoscopy beginning at age 70 years and continuing until age 72 years.  Hepatitis C blood test.** / For all people born from [birth year] through 1965 and any individual with known risks for hepatitis C.  Osteoporosis screening.** / A one-time screening for women ages 49 years and over and women at risk for fractures or osteoporosis.  Skin self-exam. / Monthly.  Influenza vaccine. /  Every year.  Tetanus, diphtheria, and acellular pertussis (Tdap/Td) vaccine.** / 1 dose of Td every 10 years.  Varicella vaccine.** / Consult your health care provider.  Zoster vaccine.** / 1 dose for adults aged 52 years or older.  Pneumococcal 13-valent conjugate (PCV13) vaccine.** / Consult your health care provider.  Pneumococcal polysaccharide (PPSV23) vaccine.** / 1 dose for all adults aged 26 years and older.  Meningococcal vaccine.** / Consult your health care provider.  Hepatitis A vaccine.** / Consult your health care provider.  Hepatitis B vaccine.** / Consult your health care provider.  Haemophilus influenzae type b (Hib) vaccine.** / Consult your health care provider. ** Family history and personal history of risk and conditions may change your health care provider's recommendations. Document Released: 01/12/2002 Document Revised: 04/02/2014 Document Reviewed:  04/13/2011 [facility] Patient Information 2015 [place], [place]. This information is not intended to replace advice given to you by your health care provider. Make sure you discuss any questions you have with your health care provider.

## 2015-03-04 NOTE — Progress Notes (Signed)
  Subjective:     Natalie [name] is a 24 y.o. female G0P0 with LMP 02/23/2015 and BMI 37 who is here for a comprehensive physical exam. The patient reports no problems. She is sexually active using Sprintec (OCP) for contraception. She is interested in IUD for contraception.  History   Social History  . Marital Status: Married    Spouse Name: N/A  . Number of Children: N/A  . Years of Education: N/A   Occupational History  . Not on file.   Social History Main Topics  . Smoking status: Never Smoker   . Smokeless tobacco: Not on file  . Alcohol Use: No  . Drug Use: No  . Sexual Activity: Not on file     Comment: refused to answer, stares blankley at writer   Other Topics Concern  . Not on file   Social History Narrative   Health Maintenance  Topic Date Due  . PAP SMEAR  11/04/2008  . TETANUS/TDAP  11/04/2009  . INFLUENZA VACCINE  07/01/2015  . HIV Screening  Completed   Past Medical History  Diagnosis Date  . Psychiatric diagnosis   . Chronic bipolar disorder   . OCD (obsessive compulsive disorder)   . H/O suicide attempt 05-2013    OD  . Anxiety   . Depression    No past surgical history on file. No family history on file.     Review of Systems A comprehensive review of systems was negative.   Objective:      GENERAL: Well-developed, well-nourished female in no acute distress. Obese HEENT: Normocephalic, atraumatic. Sclerae anicteric.  NECK: Supple. Normal thyroid.  LUNGS: Clear to auscultation bilaterally.  HEART: Regular rate and rhythm. BREASTS: Symmetric in size. No palpable masses or lymphadenopathy, skin changes, or nipple drainage. ABDOMEN: Soft, nontender, nondistended. No organomegaly. PELVIC: Normal external female genitalia. Vagina is pink and rugated.  Normal discharge. Normal appearing cervix. Uterus is normal in size. No adnexal mass or tenderness. Exam somewhat limited secondary to body habitus EXTREMITIES: No cyanosis, clubbing, or  edema, 2+ distal pulses.    Assessment:    Healthy female exam.      Plan:     Pap smear collected GC/Chlamydia collected  Patient advised to start performing monthly breast exams  IUD Procedure Note Patient identified, informed consent performed, signed copy in chart, time out was performed.  Urine pregnancy test negative.  Speculum placed in the vagina.  Cervix visualized.  Cleaned with Betadine x 2.  Grasped anteriorly with a single tooth tenaculum.  Uterus sounded to 7 cm.  Cervix dilated to 5 mm using serial insertion of Hagar dilators. Mirena IUD placed per manufacturer's recommendations.  Strings trimmed to 3 cm. Tenaculum was removed, good hemostasis noted.  Patient tolerated procedure well.   Patient given post procedure instructions and Mirena care card with expiration date.  Patient is asked to check IUD strings periodically and follow up in 4-6 weeks for IUD check.   See After Visit Summary for Counseling Recommendations

## 2015-03-05 LAB — CYTOLOGY - PAP

## 2015-03-23 NOTE — H&P (Signed)
PATIENT NAME:  Natalie [name], Natalie [name] MR#:  [id number] DATE OF BIRTH:  1991-04-02  DATE OF ADMISSION:  10/24/2014  DATE OF CONSULTATION: 10/25/2014.   CONSULTING PHYSICIAN: Audery Amel, M.D.   IDENTIFYING INFORMATION AND CHIEF COMPLAINT: A 24 year old woman sent here under involuntary commitment from [facility].   CHIEF COMPLAINT: "I'm confused."   HISTORY OF PRESENT ILLNESS: Information obtained from the patient, from the chart, and from a conversation with her father and mother. The patient was sent here under involuntary commitment from [facility]. It was indicated that she has been confused and depressed and disorganized and noncompliant with her medicine. Also indicated that she has had 3 suicide attempts in the recent past and has not been compliant with outpatient treatment.   On interview today, the patient is unable to give me much reasonable or useful information. She tells me that she feels confused and feels like she has forgotten who she really is. Most of the specific questions I asked about her history, she either tells me she cannot remember the answer to or that she is not comfortable talking about that topic. It is clear from our conversation that she has been treated for mental health problems in the past, but she will not give me details about them. She will tell me that currently, she feels depressed and also feels confused and feels like she cannot think clearly and does not understand what is going on around her. She will not discuss the possibility of hallucinations. She denies any acute suicidal or homicidal intent or plan. She states that her mood just mainly feels confused. She says she feels tired and run down. Will not report any specific physical complaints otherwise.   I got useful information from conversation with her mother and father on the phone. They indicate that for the past couple months, she has been much worse than usual. This is on top of  a general decline in her status since she got married a year or two ago. She has been recently appearing to be more depressed and confused and disorganized. She has been med noncompliant frequently. They have taken her in to see both her regular psychiatrist and emergency consultants at [facility] and her medicines been changed several times. She has had, by her mother's estimate, 8 to 10 hospitalizations just in the last few months. They indicated there have been 3 suicide attempts 2 of them by overdose, 1 of them by cutting herself with a knife all in the last few months. The family has been frustrated because when they take her into the hospital, she is usually not under commitment and they let her walk out of the hospital without any treatment.   The family report that she has been diagnosed with bipolar disorder and has been on multiple medications, but recently has not been able to stay on anything long enough for it to be clear if it is working. Both the patient and family report no history of substance abuse problems. The patient herself indicates that she has "a lot of things going on in her life." Unfortunately, she is not able to articulate, even a single one of those stresses in any a specific manner to me. The family thinks that her husband is not a good match for her, but do not make any specific allegations of abusive behavior.   PAST PSYCHIATRIC HISTORY: Family reports that she was diagnosed with bipolar disorder around age 45. For many years, she was very  stable on medication and was functioning independently. In the last couple of years, she has gone downhill with more episodes of psychosis, more medicine noncompliance and now recent suicide attempts with multiple hospitalizations. In the past, she had been on Zyprexa, which was extremely effective, but caused a very large amount of weight gain. Lithium has also been effective in the past. More recently, she has been treated with medicines such as  carbamazepine, Geodon and other antipsychotics with no really clear consistent benefit. Recently, she was changed to Seroquel, but has not been on it long enough to see if it is going to work. They do not report any history of physical violence but do say that she gets angry and agitated at times.   SUBSTANCE ABUSE HISTORY: None identified. The patient denied substance abuse.   PAST MEDICAL HISTORY: The patient denies any medical problems outside of her psychiatric problems. No known ongoing medical issues.   SOCIAL HISTORY: The patient is married and has been married for probably around a couple of years. No children. At times she has lived with her husband but more recently, since she has been sick, her husband has been taking her back to her parents house. The patient is not able to work. She has been involved in therapeutic community programs in the past, but recently has refused these.   FAMILY HISTORY: No known family history of mental illness.   REVIEW OF SYSTEMS: The patient says that she feels tired and she feels confused, feels like she has forgotten everything. Will not report feeling depressed. Denies suicidal intent or plan. Will not admit to hallucinations. Otherwise, negative review of systems.   MENTAL STATUS EXAMINATION: Slightly disheveled woman, looks her stated age, very passive and largely uncooperative with the interview. Most of the time she keeps her hand over her eyes and will not make any eye contact. Psychomotor activity is slow and very restrained. She spends most of her time on the unit standing still, staring into space or looking at the ground. It looks like she is confused most of the time and unsure what to do. Speech is decreased in total amount. Affect is flat, and dysphoric. Mood is stated as being lost. Thoughts are very slow, marked thought blocking, marked paucity of thought. Some indication of paranoia in her refusal to discuss most topics. Denies hallucinations.  Denies acute suicidal or homicidal ideation. She is alert and oriented x4, will not cooperate with any more formal cognitive testing. Normal intelligence by history and vocabulary.   PHYSICAL EXAMINATION:  GENERAL:  The patient does not appear to be in any physical distress. It looks like she has a light acne on her face. No other skin lesions identified.  HEENT: Pupils equal and reactive, face symmetric. NEUROLOGIC:   Cranial nerves all symmetric and intact.  EXTREMITIES:  Moves all extremities normally, strength and reflexes normal and symmetric throughout. Normal gait.  LUNGS: Clear with no wheezes or extra sounds.  HEART: Regular rate and rhythm.  ABDOMEN: Soft, nontender, normal bowel sounds.  VITAL SIGNS:  Blood pressure is currently 115/78, respirations 18, pulse 80, temperature 98.4.   LABORATORY RESULTS: Nothing is back yet. No labs have been ordered.   ASSESSMENT: A 24 year old woman with a history of bipolar disorder with a recent history that would suggest either psychotic depressive symptoms or possibly a transformation into schizophrenia with prominent psychosis, negative symptoms, dysphoria, very poor functioning, medicine noncompliance. Multiple recent suicide attempts, unsafe outside the hospital, needs hospitalization.  TREATMENT PLAN: The patient is unwilling to have a discussion about medicine or treatment plan. Based on the history and on what her parents are telling me, I have elected to try putting her back on a combination of Seroquel and lithium. Start Seroquel 200 mg at night, lithium 300 mg twice a day. I am going to order all the usual lab tests including a pregnancy test. The patient will be engaged as possible on the unit, in appropriate psychosocial treatment. We will work on making sure she has appropriate outpatient living situation and treatment  once she is ready for discharge.   DIAGNOSIS, PRINCIPAL AND PRIMARY:  AXIS I: Bipolar disorder, type I depression with  psychotic features.   SECONDARY DIAGNOSES:  AXIS I: Rule out schizophrenia.  AXIS II: Deferred.  AXIS III:  No diagnosis.      ____________________________ Audery Amel, MD [id number] D: 10/25/2014 10:09:14 ET T: 10/25/2014 10:25:28 ET JOB#: [id number]  cc: Audery Amel, MD, <Dictator> Audery Amel MD ELECTRONICALLY SIGNED 11/11/2014 11:15

## 2015-04-04 ENCOUNTER — Encounter: Payer: Self-pay | Admitting: Obstetrics and Gynecology

## 2015-04-04 ENCOUNTER — Ambulatory Visit: Payer: Medicaid Other | Admitting: Obstetrics and Gynecology

## 2015-04-04 VITALS — BP 117/66 | HR 92 | Temp 97.7°F | Wt 233.8 lb

## 2015-04-04 DIAGNOSIS — Z30431 Encounter for routine checking of intrauterine contraceptive device: Secondary | ICD-10-CM

## 2015-04-04 NOTE — Progress Notes (Signed)
Patient ID: Natalie [name], female   DOB: 19-Jun-1991, 24 y.o.   MRN: [id number] 24 yo G0 presenting today for IUD check. Patient has had the IUD inserted on 03/03/2014. She reports some occasional spotting. She has had intercourse without any complications or complaints.   GENERAL: Well-developed, well-nourished female in no acute distress.  ABDOMEN: Soft, nontender, nondistended. No organomegaly. PELVIC: Normal external female genitalia. Vagina is pink and rugated.  Normal discharge. Normal appearing cervix with IUD strings visualized. Uterus is normal in size. No adnexal mass or tenderness. EXTREMITIES: No cyanosis, clubbing, or edema, 2+ distal pulses.  A/P 24 yo here for IUD check - IUD appears to be in appropriate location - reminded patient that irregular bleeding is expected for up to 6 months with IUD - RTC in 1 year for annual exam or prn

## 2015-04-22 ENCOUNTER — Encounter (HOSPITAL_COMMUNITY): Payer: Self-pay | Admitting: Emergency Medicine

## 2015-04-22 ENCOUNTER — Emergency Department (INDEPENDENT_AMBULATORY_CARE_PROVIDER_SITE_OTHER)
Admission: EM | Admit: 2015-04-22 | Discharge: 2015-04-22 | Disposition: A | Discharge status: Home / Self Care | Payer: Medicaid Other | Source: Home / Self Care | Attending: Family Medicine | Admitting: Family Medicine

## 2015-04-22 DIAGNOSIS — N390 Urinary tract infection, site not specified: Secondary | ICD-10-CM

## 2015-04-22 LAB — POCT URINALYSIS DIP (DEVICE)
BILIRUBIN URINE: NEGATIVE
Glucose, UA: NEGATIVE mg/dL
KETONES UR: NEGATIVE mg/dL
Nitrite: NEGATIVE
PH: 6.5 (ref 5.0–8.0)
PROTEIN: NEGATIVE mg/dL
Specific Gravity, Urine: 1.015 (ref 1.005–1.030)
UROBILINOGEN UA: 0.2 mg/dL (ref 0.0–1.0)

## 2015-04-22 LAB — POCT PREGNANCY, URINE: PREG TEST UR: NEGATIVE

## 2015-04-22 MED ORDER — CEPHALEXIN 500 MG PO CAPS: 500.0000 mg | ORAL_CAPSULE | Freq: Two times a day (BID) | ORAL | Status: DC

## 2015-04-22 NOTE — Discharge Instructions (Signed)
Thank you for coming in today. °Call or go to the emergency room if you get worse, have trouble breathing, have chest pains, or palpitations.  ° °Urinary Tract Infection °Urinary tract infections (UTIs) can develop anywhere along your urinary tract. Your urinary tract is your body's drainage system for removing wastes and extra water. Your urinary tract includes two kidneys, two ureters, a bladder, and a urethra. Your kidneys are a pair of bean-shaped organs. Each kidney is about the size of your fist. They are located below your ribs, one on each side of your spine. °CAUSES °Infections are caused by microbes, which are microscopic organisms, including fungi, viruses, and bacteria. These organisms are so small that they can only be seen through a microscope. Bacteria are the microbes that most commonly cause UTIs. °SYMPTOMS  °Symptoms of UTIs may vary by age and gender of the patient and by the location of the infection. Symptoms in young women typically include a frequent and intense urge to urinate and a painful, burning feeling in the bladder or urethra during urination. Older women and men are more likely to be tired, shaky, and weak and have muscle aches and abdominal pain. A fever may mean the infection is in your kidneys. Other symptoms of a kidney infection include pain in your back or sides below the ribs, nausea, and vomiting. °DIAGNOSIS °To diagnose a UTI, your caregiver will ask you about your symptoms. Your caregiver also will ask to provide a urine sample. The urine sample will be tested for bacteria and white blood cells. White blood cells are made by your body to help fight infection. °TREATMENT  °Typically, UTIs can be treated with medication. Because most UTIs are caused by a bacterial infection, they usually can be treated with the use of antibiotics. The choice of antibiotic and length of treatment depend on your symptoms and the type of bacteria causing your infection. °HOME CARE  INSTRUCTIONS °· If you were prescribed antibiotics, take them exactly as your caregiver instructs you. Finish the medication even if you feel better after you have only taken some of the medication. °· Drink enough water and fluids to keep your urine clear or pale yellow. °· Avoid caffeine, tea, and carbonated beverages. They tend to irritate your bladder. °· Empty your bladder often. Avoid holding urine for long periods of time. °· Empty your bladder before and after sexual intercourse. °· After a bowel movement, women should cleanse from front to back. Use each tissue only once. °SEEK MEDICAL CARE IF:  °· You have back pain. °· You develop a fever. °· Your symptoms do not begin to resolve within 3 days. °SEEK IMMEDIATE MEDICAL CARE IF:  °· You have severe back pain or lower abdominal pain. °· You develop chills. °· You have nausea or vomiting. °· You have continued burning or discomfort with urination. °MAKE SURE YOU:  °· Understand these instructions. °· Will watch your condition. °· Will get help right away if you are not doing well or get worse. °Document Released: 08/26/2005 Document Revised: 05/17/2012 Document Reviewed: 12/25/2011 °ExitCare® Patient Information ©2015 [employer]. This information is not intended to replace advice given to you by your health care provider. Make sure you discuss any questions you have with your health care provider. ° °

## 2015-04-22 NOTE — ED Provider Notes (Signed)
Natalie [name] is a 24 y.o. female who presents to Urgent Care today for urinary frequency urgency and dysuria. Symptoms present for the last week. No fevers chills nausea vomiting or diarrhea. She feels well otherwise. She has multiple psychiatric diagnoses including bipolar and OCD. No change in her lithium or respiratory.   Past Medical History  Diagnosis Date  . Psychiatric diagnosis   . Chronic bipolar disorder   . OCD (obsessive compulsive disorder)   . H/O suicide attempt 05-2013    OD  . Anxiety   . Depression    History reviewed. No pertinent past surgical history. History  Substance Use Topics  . Smoking status: Never Smoker   . Smokeless tobacco: Not on file  . Alcohol Use: No   ROS as above Medications: No current facility-administered medications for this encounter.   Current Outpatient Prescriptions  Medication Sig Dispense Refill  . risperiDONE (RISPERDAL) 2 MG tablet Take 2-4 mg by mouth 2 (two) times daily. Take 1 tab in the morning and 2 tabs at bedtime    . risperiDONE microspheres (RISPERDAL CONSTA) 37.5 MG injection Inject 37.5 mg into the muscle every 14 (fourteen) days.    . cephALEXin (KEFLEX) 500 MG capsule Take 1 capsule (500 mg total) by mouth 2 (two) times daily. 14 capsule 0  . lithium carbonate (ESKALITH) 450 MG CR tablet Take 450 mg by mouth 2 (two) times daily.     No Known Allergies   Exam:  BP 125/86 mmHg  Pulse 113  Temp(Src) 97.9 F (36.6 C) (Oral)  Resp 20  SpO2 99%  LMP 02/25/2015 Gen: Well NAD HEENT: EOMI,  MMM Lungs: Normal work of breathing. CTABL Heart: Mild tachycardia no MRG Abd: NABS, Soft. Nondistended, Nontender no CV angle tenderness to percussion Exts: Brisk capillary refill, warm and well perfused.   Results for orders placed or performed during the hospital encounter of 04/22/15 (from the past 24 hour(s))  POCT urinalysis dip (device)     Status: Abnormal   Collection Time: 04/22/15 11:34 AM  Result Value Ref  Range   Glucose, UA NEGATIVE NEGATIVE mg/dL   Bilirubin Urine NEGATIVE NEGATIVE   Ketones, ur NEGATIVE NEGATIVE mg/dL   Specific Gravity, Urine 1.015 1.005 - 1.030   Hgb urine dipstick MODERATE (A) NEGATIVE   pH 6.5 5.0 - 8.0   Protein, ur NEGATIVE NEGATIVE mg/dL   Urobilinogen, UA 0.2 0.0 - 1.0 mg/dL   Nitrite NEGATIVE NEGATIVE   Leukocytes, UA TRACE (A) NEGATIVE  Pregnancy, urine POC     Status: None   Collection Time: 04/22/15 11:37 AM  Result Value Ref Range   Preg Test, Ur NEGATIVE NEGATIVE   No results found.  Assessment and Plan: 24 y.o. female with urinary tract infection. Treat with Keflex. Culture pending. Return as needed.  Discussed warning signs or symptoms. Please see discharge instructions. Patient expresses understanding.     Rodolph Bong, MD 04/22/15 1145

## 2015-04-22 NOTE — ED Notes (Signed)
C/o UTI sx onset 1 week Sx include urinary freq/urgency Denies fevers, chills, dysuria, hematuria Alert, no signs of acute distress.

## 2015-04-23 LAB — URINE CULTURE
Colony Count: NO GROWTH
Culture: NO GROWTH
Special Requests: NORMAL

## 2017-03-11 ENCOUNTER — Ambulatory Visit: Payer: Self-pay | Admitting: Obstetrics and Gynecology

## 2017-03-19 ENCOUNTER — Encounter: Payer: Self-pay | Admitting: Obstetrics and Gynecology

## 2017-03-19 ENCOUNTER — Other Ambulatory Visit (HOSPITAL_COMMUNITY)
Admission: RE | Admit: 2017-03-19 | Discharge: 2017-03-19 | Disposition: A | Discharge status: Home / Self Care | Payer: Medicaid Other | Source: Ambulatory Visit | Attending: Obstetrics and Gynecology | Admitting: Obstetrics and Gynecology

## 2017-03-19 ENCOUNTER — Ambulatory Visit (INDEPENDENT_AMBULATORY_CARE_PROVIDER_SITE_OTHER): Payer: Medicaid Other | Admitting: Obstetrics and Gynecology

## 2017-03-19 VITALS — BP 130/75 | HR 100 | Wt 249.5 lb

## 2017-03-19 DIAGNOSIS — Z01419 Encounter for gynecological examination (general) (routine) without abnormal findings: Secondary | ICD-10-CM | POA: Diagnosis present

## 2017-03-19 NOTE — Progress Notes (Signed)
Subjective:     Natalie [name] is a 26 y.o. female G0 with BMI 42 who is here for a comprehensive physical exam. The patient reports no problems. She reports amenorrhea related to Mirena IUD. She is sexually active without complaints. She denies pelvic pain or abnormal discharge  Past Medical History:  Diagnosis Date  . Anxiety   . Chronic bipolar disorder (HCC)   . Depression   . H/O suicide attempt 05-2013   OD  . OCD (obsessive compulsive disorder)   . Psychiatric diagnosis    No past surgical history on file. No family history on file.  Social History   Social History  . Marital status: Married    Spouse name: N/A  . Number of children: N/A  . Years of education: N/A   Occupational History  . Not on file.   Social History Main Topics  . Smoking status: Never Smoker  . Smokeless tobacco: Never Used  . Alcohol use No  . Drug use: No  . Sexual activity: Yes    Birth control/ protection: Pill     Comment: refused to answer, stares blankley at writer   Other Topics Concern  . Not on file   Social History Narrative  . No narrative on file   Health Maintenance  Topic Date Due  . TETANUS/TDAP  11/04/2009  . INFLUENZA VACCINE  06/30/2017  . PAP SMEAR  03/03/2018  . HIV Screening  Completed       Review of Systems Pertinent items are noted in HPI.   Objective:  Blood pressure 130/75, pulse 100, weight 249 lb 8 oz (113.2 kg).     GENERAL: Well-developed, well-nourished female in no acute distress.  HEENT: Normocephalic, atraumatic. Sclerae anicteric.  NECK: Supple. Normal thyroid.  LUNGS: Clear to auscultation bilaterally.  HEART: Regular rate and rhythm. BREASTS: Symmetric in size. No palpable masses or lymphadenopathy, skin changes, or nipple drainage. ABDOMEN: Soft, nontender, nondistended. Obese PELVIC: Normal external female genitalia. Vagina is pink and rugated.  Normal discharge. Normal appearing cervix with IUD strings visualized at the os. Uterus  is normal in size. No adnexal mass or tenderness. EXTREMITIES: No cyanosis, clubbing, or edema, 2+ distal pulses.   Assessment:    Healthy female exam.      Plan:    pap smear collected Patient will be notified of abnormal results IUD due to be removed in 3 years Weight loss management discussed RTC prn See After Visit Summary for Counseling Recommendations

## 2017-03-22 LAB — CYTOLOGY - PAP: DIAGNOSIS: NEGATIVE

## 2019-04-19 ENCOUNTER — Other Ambulatory Visit: Payer: Self-pay

## 2019-04-19 ENCOUNTER — Encounter (HOSPITAL_COMMUNITY): Payer: Self-pay | Admitting: Psychiatry

## 2019-04-19 ENCOUNTER — Ambulatory Visit (INDEPENDENT_AMBULATORY_CARE_PROVIDER_SITE_OTHER): Payer: Medicaid Other | Admitting: Psychiatry

## 2019-04-19 DIAGNOSIS — F3162 Bipolar disorder, current episode mixed, moderate: Secondary | ICD-10-CM | POA: Diagnosis not present

## 2019-04-19 DIAGNOSIS — F5102 Adjustment insomnia: Secondary | ICD-10-CM | POA: Diagnosis not present

## 2019-04-19 DIAGNOSIS — F411 Generalized anxiety disorder: Secondary | ICD-10-CM

## 2019-04-19 MED ORDER — BENZTROPINE MESYLATE 0.5 MG PO TABS: 0.5000 mg | ORAL_TABLET | Freq: Two times a day (BID) | ORAL | 1 refills | Status: DC

## 2019-04-19 MED ORDER — LITHIUM CARBONATE ER 450 MG PO TBCR: 450.0000 mg | EXTENDED_RELEASE_TABLET | Freq: Two times a day (BID) | ORAL | 1 refills | Status: DC

## 2019-04-19 MED ORDER — RISPERIDONE 0.25 MG PO TABS: 0.2500 mg | ORAL_TABLET | Freq: Every day | ORAL | 1 refills | Status: DC

## 2019-04-19 MED ORDER — TRAZODONE HCL 100 MG PO TABS
100.0000 mg | ORAL_TABLET | Freq: Every day | ORAL | 1 refills | Status: DC
Start: 2019-04-19 — End: 2019-07-20
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
## 2019-05-22 ENCOUNTER — Ambulatory Visit (INDEPENDENT_AMBULATORY_CARE_PROVIDER_SITE_OTHER): Payer: Medicaid Other | Admitting: Psychiatry

## 2019-05-22 ENCOUNTER — Encounter (HOSPITAL_COMMUNITY): Payer: Self-pay | Admitting: Psychiatry

## 2019-05-22 DIAGNOSIS — F411 Generalized anxiety disorder: Secondary | ICD-10-CM

## 2019-05-22 DIAGNOSIS — F5102 Adjustment insomnia: Secondary | ICD-10-CM | POA: Diagnosis not present

## 2019-05-22 DIAGNOSIS — F3162 Bipolar disorder, current episode mixed, moderate: Secondary | ICD-10-CM

## 2019-05-22 MED ORDER — RISPERIDONE 0.25 MG PO TABS: 0.2500 mg | ORAL_TABLET | Freq: Two times a day (BID) | ORAL | 1 refills | Status: DC
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
## 2019-07-08 ENCOUNTER — Ambulatory Visit (HOSPITAL_COMMUNITY)
Admission: EM | Admit: 2019-07-08 | Discharge: 2019-07-08 | Disposition: A | Discharge status: Home / Self Care | Payer: Medicaid Other | Attending: Family Medicine | Admitting: Family Medicine

## 2019-07-08 ENCOUNTER — Other Ambulatory Visit: Payer: Self-pay

## 2019-07-08 ENCOUNTER — Encounter (HOSPITAL_COMMUNITY): Payer: Self-pay | Admitting: *Deleted

## 2019-07-08 DIAGNOSIS — M791 Myalgia, unspecified site: Secondary | ICD-10-CM | POA: Diagnosis not present

## 2019-07-08 MED ORDER — ACETAMINOPHEN 500 MG PO TABS: 500.0000 mg | ORAL_TABLET | Freq: Four times a day (QID) | ORAL | 0 refills | Status: DC | PRN

## 2019-07-08 NOTE — Discharge Instructions (Signed)
Tylenol as needed. Light and regular activity and walking can be helpful in prevention of pain.  I provided some exercises which are for back issues but can be helpful generally with stretching.  Heat application to affected areas may help.  Strong soled and supportive shoes while active.  Please continue to follow up with your psychiatrist as well as your primary care provider as you may need further evaluation to determine cause of your symptoms.

## 2019-07-08 NOTE — ED Triage Notes (Addendum)
C/O various migrating pains to entire body over past yr.  States "I have been everywhere but no one can help me".  C/O "pain and soreness" and "sensitivity to everything".  Also c/o difficulty ambulating due to limp.

## 2019-07-09 NOTE — ED Provider Notes (Signed)
[facility]    CSN: [id number] Arrival date & time: 07/08/19  1013      History   Chief Complaint Chief Complaint  Patient presents with  . Generalized Body Aches    HPI Natalie FramesBriyonda Smolenski is a 28 y.o. female.   Natalie [name] presents with complaints of pain which she experiences "everywhere" which is changing regularly in location, for the past 1 year. Described often as a "soreness." It can worsen and becomes severe. Often to her feet and lower legs, worse if standing too long for example. Can also feel it to her arms and shoulders as well at times. Pain moves to different areas of the body and extremities. She will feel specific point discomfort. Today her legs and feet are painful. No injuries. No redness or swelling. She feels like if she sits or lays too long this can trigger pain as well, such as to her back. Has tried tylenol which hasn't helped with pain. No fevers or chills. No nausea, vomiting or diarrhea. No known tremors or spasms. No obvious extrapyramidal type manifestations. She has a history of bipolar with psychotic features, she is followed by psychiatry. She is on lithium, Risperdal, trazodone as well as cogentin. This has been well monitored per patient which appears consistent with chart review. She has been seeing her psychiatrist virtually more recently due to pandemic. Denies regular exercise as she feels like this worsens her symptoms. States her bipolar is well managed currently.     ROS per HPI, negative if not otherwise mentioned.      Past Medical History:  Diagnosis Date  . Anxiety   . Chronic bipolar disorder (HCC)   . Depression   . H/O suicide attempt 05-2013   OD  . OCD (obsessive compulsive disorder)   . Psychiatric diagnosis     Patient Active Problem List   Diagnosis Date Noted  . Bipolar disorder, current episode depressed, severe, with psychotic features (HCC)   . Bipolar disorder, curr episode mixed, severe, w/o psychotic  features (HCC) 08/31/2014  . Bipolar 1 disorder (HCC) 08/31/2014  . Bipolar 1 disorder, depressed (HCC) 07/12/2014  . Bipolar I disorder, most recent episode (or current) mixed with psychosis 05/30/2014  . Bipolar 1 disorder, manic, moderate (HCC) 05/29/2014  . Bipolar affective disorder, current episode depressed (HCC) 12/22/2013  . Chronic bipolar disorder (HCC)   . OCD (obsessive compulsive disorder)     History reviewed. No pertinent surgical history.  OB History    Gravida  0   Para  0   Term  0   Preterm  0   AB  0   Living  0     SAB  0   TAB  0   Ectopic  0   Multiple  0   Live Births               Home Medications    Prior to Admission medications   Medication Sig Start Date End Date Taking? Authorizing Provider  benztropine (COGENTIN) 0.5 MG tablet Take 1 tablet (0.5 mg total) by mouth 2 (two) times daily. 04/19/19 04/18/20 Yes Thresa RossAkhtar, Nadeem, MD  levonorgestrel (MIRENA) 20 MCG/24HR IUD 1 each by Intrauterine route once.   Yes [provider]  lithium carbonate (ESKALITH) 450 MG CR tablet Take 1 tablet (450 mg total) by mouth 2 (two) times daily. 04/19/19 04/18/20 Yes Thresa RossAkhtar, Nadeem, MD  risperiDONE (RISPERDAL) 0.25 MG tablet Take 1 tablet (0.25 mg total) by mouth 2 (two)  times daily. 05/22/19 05/21/20 Yes Merian Capron, MD  traZODone (DESYREL) 100 MG tablet Take 1 tablet (100 mg total) by mouth at bedtime. 04/19/19 04/18/20 Yes Merian Capron, MD  acetaminophen (TYLENOL) 500 MG tablet Take 1 tablet (500 mg total) by mouth every 6 (six) hours as needed. 07/08/19   Augusto Gamble B, NP  Paliperidone Palmitate (INVEGA SUSTENNA IM) Inject 1 each into the muscle every 30 (thirty) days.  04/19/19  [provider]    Family History Family History  Problem Relation Age of Onset  . Healthy Mother   . Healthy Father   . Hypertension Maternal Grandmother   . Obesity Maternal Grandmother     Social History Social History   Tobacco Use  .  Smoking status: Never Smoker  . Smokeless tobacco: Never Used  Substance Use Topics  . Alcohol use: No  . Drug use: No     Allergies   Patient has no known allergies.   Review of Systems Review of Systems   Physical Exam Triage Vital Signs ED Triage Vitals  Enc Vitals Group     BP 07/08/19 1034 127/80     Pulse Rate 07/08/19 1034 97     Resp 07/08/19 1034 20     Temp 07/08/19 1034 98.7 F (37.1 C)     Temp Source 07/08/19 1034 Oral     SpO2 07/08/19 1034 97 %     Weight --      Height --      Head Circumference --      Peak Flow --      Pain Score 07/08/19 1032 10     Pain Loc --      Pain Edu? --      Excl. in [place]? --    No data found.  Updated Vital Signs BP 127/80   Pulse 97   Temp 98.7 F (37.1 C) (Oral)   Resp 20   SpO2 97%   Visual Acuity Right Eye Distance:   Left Eye Distance:   Bilateral Distance:    Right Eye Near:   Left Eye Near:    Bilateral Near:     Physical Exam Constitutional:      General: She is not in acute distress.    Appearance: She is well-developed. She is obese.  Cardiovascular:     Rate and Rhythm: Normal rate.  Pulmonary:     Effort: Pulmonary effort is normal.  Musculoskeletal:     Comments: Moving all extremities without difficulty; no specific point tenderness; ambulatory without difficulty; strong pulses; cap refill < 2 seconds to extremities; no redness or swelling to joints or soft tissue  Skin:    General: Skin is warm and dry.  Neurological:     Mental Status: She is alert and oriented to person, place, and time.  Psychiatric:        Mood and Affect: Mood normal.        Behavior: Behavior normal.        Thought Content: Thought content normal.      UC Treatments / Results  Labs (all labs ordered are listed, but only abnormal results are displayed) Labs Reviewed - No data to display  EKG   Radiology No results found.  Procedures Procedures (including critical care time)  Medications Ordered in  UC Medications - No data to display  Initial Impression / Assessment and Plan / UC Course  I have reviewed the triage vital signs and the nursing notes.  Pertinent  labs & imaging results that were available during my care of the patient were reviewed by me and considered in my medical decision making (see chart for details).     Myalgia's, often positional or related to activity . No acute findings on exam at this time. Medications vs depression/ fibromyalgia vs deconditioning considered for etiology. Recommended regular activity as tolerated. Pain management discussed. Continue to follow up with psychiatrist as well as PCP as may need further evaluation if persistent. Return precautions provided. Patient verbalized understanding and agreeable to plan.  Ambulatory out of clinic without difficulty.    Final Clinical Impressions(s) / UC Diagnoses   Final diagnoses:  Myalgia     Discharge Instructions     Tylenol as needed. Light and regular activity and walking can be helpful in prevention of pain.  I provided some exercises which are for back issues but can be helpful generally with stretching.  Heat application to affected areas may help.  Strong soled and supportive shoes while active.  Please continue to follow up with your psychiatrist as well as your primary care provider as you may need further evaluation to determine cause of your symptoms.    ED Prescriptions    Medication Sig Dispense Auth. Provider   acetaminophen (TYLENOL) 500 MG tablet Take 1 tablet (500 mg total) by mouth every 6 (six) hours as needed. 30 tablet Georgetta Haber, NP     Controlled Substance Prescriptions [place] Controlled Substance Registry consulted? Not Applicable   Georgetta Haber, NP 07/09/19 [phone]

## 2019-07-20 ENCOUNTER — Other Ambulatory Visit (HOSPITAL_COMMUNITY): Payer: Self-pay

## 2019-07-20 ENCOUNTER — Encounter (HOSPITAL_COMMUNITY): Payer: Self-pay | Admitting: Psychiatry

## 2019-07-20 ENCOUNTER — Ambulatory Visit (INDEPENDENT_AMBULATORY_CARE_PROVIDER_SITE_OTHER): Payer: Medicaid Other | Admitting: Psychiatry

## 2019-07-20 ENCOUNTER — Other Ambulatory Visit: Payer: Self-pay

## 2019-07-20 DIAGNOSIS — F411 Generalized anxiety disorder: Secondary | ICD-10-CM | POA: Diagnosis not present

## 2019-07-20 DIAGNOSIS — F5102 Adjustment insomnia: Secondary | ICD-10-CM | POA: Diagnosis not present

## 2019-07-20 DIAGNOSIS — F3162 Bipolar disorder, current episode mixed, moderate: Secondary | ICD-10-CM | POA: Diagnosis not present

## 2019-07-20 MED ORDER — RISPERIDONE 0.25 MG PO TABS: 0.2500 mg | ORAL_TABLET | Freq: Two times a day (BID) | ORAL | 1 refills | Status: DC

## 2019-07-20 MED ORDER — LITHIUM CARBONATE ER 450 MG PO TBCR: 450.0000 mg | EXTENDED_RELEASE_TABLET | Freq: Two times a day (BID) | ORAL | 0 refills | Status: DC

## 2019-07-20 MED ORDER — BENZTROPINE MESYLATE 0.5 MG PO TABS: 0.5000 mg | ORAL_TABLET | Freq: Two times a day (BID) | ORAL | 1 refills | Status: DC

## 2019-07-20 MED ORDER — LITHIUM CARBONATE ER 450 MG PO TBCR: 450.0000 mg | EXTENDED_RELEASE_TABLET | Freq: Two times a day (BID) | ORAL | 1 refills | Status: DC

## 2019-07-20 MED ORDER — TRAZODONE HCL 100 MG PO TABS: 100.0000 mg | ORAL_TABLET | Freq: Every day | ORAL | 1 refills | Status: DC

## 2019-07-20 NOTE — Progress Notes (Signed)
[facility] Follow up    Patient Identification: Natalie [name] MRN:  [id number] Date of Evaluation:  07/20/2019 Referral Source: primary care Chief Complaint:   Bipolar follow up   I connected with Natalie [name] on 07/20/19 at 10:30 AM EDT by a video enabled telemedicine application and verified that I am speaking with the correct person using two identifiers.      I discussed the limitations, risks, security and privacy concerns of performing an evaluation and management service by telephone and the availability of in person appointments. I also discussed with the patient that there may be a patient responsible charge related to this service. The patient expressed understanding and agreed to proceed.  Visit Diagnosis:    ICD-10-CM   1. Bipolar 1 disorder, mixed, moderate (HCC)  F31.62   2. GAD (generalized anxiety disorder)  F41.1   3. Adjustment insomnia  F51.02     History of Present Illness: 28 year old currently married African-American female referred by primary care physician for management of bipolar disorder.  History of Fanapt use but was not covered, med was changed due to prolactin concern  Doing fair on low dose risperdal, understands the risk but wants to continue 2 times recently woke up sick and vomited. Going thru lab work with primary care Pregnancy test was negative , Other labs being done by primary care meds have been the same no change Mood and bipolar wise doing stable. Husband agrees  Lithium was reviewed, also on cogentin  She and her husband feels risperdal works the best and wants to continue  Modifying factors; both of them are on disability income they have a housing.  Aggravating factor mental health.  Noncompliance or some concerns of side effects from most of the medication or antipsychotics that she has tried  Past Psychiatric History: bipolar    Past Medical History:  Past Medical History:  Diagnosis Date  . Anxiety   . Chronic bipolar  disorder (HCC)   . Depression   . H/O suicide attempt 05-2013   OD  . OCD (obsessive compulsive disorder)   . Psychiatric diagnosis    History reviewed. No pertinent surgical history.  Family Psychiatric History: father : bipolar has been on seroquel  Family History:  Family History  Problem Relation Age of Onset  . Healthy Mother   . Healthy Father   . Hypertension Maternal Grandmother   . Obesity Maternal Grandmother     Social History:   Social History   Socioeconomic History  . Marital status: Married    Spouse name: Not on file  . Number of children: Not on file  . Years of education: Not on file  . Highest education level: Not on file  Occupational History  . Not on file  Social Needs  . Financial resource strain: Not on file  . Food insecurity    Worry: Not on file    Inability: Not on file  . Transportation needs    Medical: Not on file    Non-medical: Not on file  Tobacco Use  . Smoking status: Never Smoker  . Smokeless tobacco: Never Used  Substance and Sexual Activity  . Alcohol use: No  . Drug use: No  . Sexual activity: Yes    Birth control/protection: I.U.D.    Comment: refused to answer, stares blankley at writer  Lifestyle  . Physical activity    Days per week: Not on file    Minutes per session: Not on file  . Stress: Not on file  Relationships  . Social Musician on phone: Not on file    Gets together: Not on file    Attends religious service: Not on file    Active member of club or organization: Not on file    Attends meetings of clubs or organizations: Not on file    Relationship status: Not on file  Other Topics Concern  . Not on file  Social History Narrative  . Not on file     Allergies:  No Known Allergies  Metabolic Disorder Labs: Lab Results  Component Value Date   HGBA1C 4.8 11/05/2014   MPG 100 09/03/2014   No results found for: PROLACTIN Lab Results  Component Value Date   CHOL 138 11/05/2014   TRIG  90 11/05/2014   HDL 53 11/05/2014   CHOLHDL 4.3 08/20/2007   VLDL 18 11/05/2014   LDLCALC 67 11/05/2014   LDLCALC (H) 08/20/2007    165        Total Cholesterol/HDL:CHD Risk Coronary Heart Disease Risk Table                     Men   Women  1/2 Average Risk   3.4   3.3   Lab Results  Component Value Date   TSH 1.966 12/24/2013    Therapeutic Level Labs: Lab Results  Component Value Date   LITHIUM 0.41 (L) 12/24/2013   Lab Results  Component Value Date   CBMZ 3.7 (L) 10/20/2014   Lab Results  Component Value Date   VALPROATE 79.2 07/13/2014    Current Medications: Current Outpatient Medications  Medication Sig Dispense Refill  . acetaminophen (TYLENOL) 500 MG tablet Take 1 tablet (500 mg total) by mouth every 6 (six) hours as needed. 30 tablet 0  . benztropine (COGENTIN) 0.5 MG tablet Take 1 tablet (0.5 mg total) by mouth 2 (two) times daily. 60 tablet 1  . levonorgestrel (MIRENA) 20 MCG/24HR IUD 1 each by Intrauterine route once.    . lithium carbonate (ESKALITH) 450 MG CR tablet Take 1 tablet (450 mg total) by mouth 2 (two) times daily. 60 tablet 1  . risperiDONE (RISPERDAL) 0.25 MG tablet Take 1 tablet (0.25 mg total) by mouth 2 (two) times daily. 60 tablet 1  . traZODone (DESYREL) 100 MG tablet Take 1 tablet (100 mg total) by mouth at bedtime. 30 tablet 1   No current facility-administered medications for this visit.     Musculoskeletal: Denies stiffness, restlessness  Psychiatric Specialty Exam: Review of Systems  Cardiovascular: Negative for chest pain.  Musculoskeletal: Negative for myalgias.  Skin: Negative for rash.  Psychiatric/Behavioral: Negative for depression, hallucinations, substance abuse and suicidal ideas.    There were no vitals taken for this visit.There is no height or weight on file to calculate BMI.  General Appearance:   Eye Contact:   Speech:  Slow  Volume:  Normal  Mood: fair  Affect:  Congruent  Thought Process:  Goal Directed   Orientation:  Full (Time, Place, and Person)  Thought Content:  Logical  Suicidal Thoughts:  No  Homicidal Thoughts:  No  Memory:  Immediate;   Fair Recent;   Fair  Judgement:  Fair  Insight:  Shallow  Psychomotor Activity:  Normal  Concentration:  fair  Recall:  [employer] of Knowledge:Fair  Language: Fair  Akathisia:  No  Handed:  Right  AIMS (if indicated):  No abnormal movements  Assets:  Desire for Improvement Housing Social Support  ADL's:  Intact  Cognition: WNL  Sleep:  Fair   Screenings: AUDIT     Admission (Discharged) from 08/31/2014 in [place] [facility] Admission (Discharged) from 07/12/2014 in [place] [facility] Admission (Discharged) from 06/13/2014 in [place] [facility] Admission (Discharged) from 05/29/2014 in [place] [facility] Admission (Discharged) from 12/22/2013 in [place] [facility]  Alcohol Use Disorder Identification Test Final Score (AUDIT)  0  0  0  0  0    GAD-7     Office Visit from 03/19/2017 in [place] for [facility]  Total GAD-7 Score  0    PHQ2-9     Office Visit from 03/19/2017 in [place] for [facility]  PHQ-2 Total Score  0  PHQ-9 Total Score  0      Assessment and Plan: as follows Bipolar disorder, mixed or depressed : doing fair, continue lithium and risperdal Gets labs done by primary care and will follow up for any concerns No tremors.  She will continue Cogentin states she want to continue twice a day as of now GAD: manageable as long as mood remains balanced, husband is supportive Insomnia: Continue trazodone for now  Denies tremors I discussed the assessment and treatment plan with the patient. The patient was provided an opportunity to ask questions and all were answered. The patient agreed with the plan and demonstrated an understanding of the instructions.    The patient was advised to call back or seek an in-person evaluation if the symptoms worsen or if the condition fails to improve as anticipated. Fu with primary care as needed  They want to fu in 65m. They want to come in 55m.    Merian Capron, MD 8/20/202010:42 AM

## 2019-08-09 ENCOUNTER — Telehealth (HOSPITAL_COMMUNITY): Payer: Self-pay | Admitting: Psychiatry

## 2019-08-09 NOTE — Telephone Encounter (Signed)
Not sure if pscyh med causing it. Should get evaluated by pcp or urgent care. There she can also get lithium level if needed. Can cut down trazadone to half for now. Call back for concerns

## 2019-08-09 NOTE — Telephone Encounter (Signed)
Informed patient of the following. She will call a PCP.   Nothing Further Needed at this time.

## 2019-08-09 NOTE — Telephone Encounter (Signed)
Patient has been throwing up a lot and nauseous. Would like dr Antionette Fairy recommendations.   CB # [phone]

## 2019-10-22 ENCOUNTER — Encounter: Payer: Self-pay | Admitting: Family Medicine

## 2019-10-23 ENCOUNTER — Other Ambulatory Visit: Payer: Self-pay

## 2019-10-23 ENCOUNTER — Encounter: Payer: Self-pay | Admitting: Family Medicine

## 2019-10-23 ENCOUNTER — Ambulatory Visit: Payer: Medicaid Other | Admitting: Family Medicine

## 2019-10-23 DIAGNOSIS — Z23 Encounter for immunization: Secondary | ICD-10-CM | POA: Diagnosis not present

## 2019-10-23 DIAGNOSIS — N912 Amenorrhea, unspecified: Secondary | ICD-10-CM | POA: Diagnosis not present

## 2019-10-23 DIAGNOSIS — F319 Bipolar disorder, unspecified: Secondary | ICD-10-CM

## 2019-10-23 DIAGNOSIS — R52 Pain, unspecified: Secondary | ICD-10-CM | POA: Diagnosis not present

## 2019-10-23 DIAGNOSIS — R030 Elevated blood-pressure reading, without diagnosis of hypertension: Secondary | ICD-10-CM | POA: Diagnosis not present

## 2019-10-23 DIAGNOSIS — R200 Anesthesia of skin: Secondary | ICD-10-CM | POA: Diagnosis not present

## 2019-10-23 NOTE — Assessment & Plan Note (Signed)
Discussed reducing sugar sweetened beverages.  Patient had significant weight gain with injectable atypical antipsychotic.  We will continue to work with patient towards weight loss.

## 2019-10-23 NOTE — Progress Notes (Signed)
Patient Name: Natalie [name] Date of Birth: 13-Oct-1991 Date of Visit: 10/23/19 PCP: System, Provider Not In  Chief Complaint: body pain from head to toe   Subjective: Natalie [name] is a pleasant 28 y.o. with medical history significant for bipolar I and obesity presenting today for establishing care and pain throughout body.    Body Pain The patient reports a year of intermittent body pain.  This occurs every day.  This starts at the top of her head and progresses to her feet.  The pain lasts between 5 to 15 minutes.  It recurs throughout the day.  Is improved by activity.  Is unchanged by medications.  She reports unassociated but concomitant bilateral hand numbness as described below.  This occurs separately and at night.  She denies fevers, weight loss, joint swelling, specific joint pain, family or personal history of rheumatologic condition, trauma, history of motor vehicle accident.  Mood' The patient follows with psychiatry for her bipolar disorder.  She denies thoughts of hurting herself or others.  She reports her medications have been relatively stable.  Injectable medication caused her to gain a significant amount of weight which she strongly dislikes.  Hand Pain/Numbness  The patient reports nocturnal and sometimes morning hand numbness and weakness.  This is poorly localized symptoms along digits 1 through 3 sometimes digits 4 and 5.  She wakes up at night and has to shake her hands out.  She endorses some right hand weakness at times.  She denies neck pain or trauma.  She denies shoulder or elbow pain.  ROS: Per HPI.   I have reviewed the patient's medical, surgical, family, and social history as appropriate.  Vitals:   10/23/19 0927  BP: 130/80  Pulse: (!) 101  SpO2: 98%   HEENT: Sclera anicteric. Dentition is moderate. Appears well hydrated. Neck: Supple Cardiac: Regular rate and rhythm. Normal S1/S2. No murmurs, rubs, or gallops appreciated. Lungs: Clear  bilaterally to ascultation.  Abdomen: Normoactive bowel sounds. No tenderness to deep or light palpation. No rebound or guarding.  Extremities: Warm, well perfused without edema.  Skin: Warm, dry Psych: Pleasant and appropriate   Neuro: CN II: PERRL CN III, IV,VI: EOMI CV V: Normal sensation in V1, V2, V3 CVII: Symmetric smile and brow raise CN VIII: Normal hearing CN IX,X: Symmetric palate raise  CN XI: 5/5 shoulder shrug CN XII: Symmetric tongue protrusion  Upper extremity strength 4 out of 5 grip strength 5 out of 5 wrist flexion extension 5 out of 5 throughout proximal upper extremities.  Lower extremities 5 out of 5 throughout 2+ UE and LE reflexes    Bipolar 1 disorder (HCC) Given side effect profile medications will check metabolic panel, Q2W lipids and lithium level today.  Morbid obesity ([place]) Discussed reducing sugar sweetened beverages.  Patient had significant weight gain with injectable atypical antipsychotic.  We will continue to work with patient towards weight loss.  Total body pain Uncertain etiology, no red flags in history.  She has no fevers, weight loss, bowel or bladder incontinence or other concerns.  Differential is broad most likely this is related to hypersensitivity of nervous system possibly fibromyalgia or other connective tissue disorder.  Discussed with patient at length.  Patient would very much like referral to rheumatology.  Discussed low likelihood that this may be rheumatologic condition given her lack of systemic symptoms, family history.  Will obtain basic blood work to further evaluate engage in shared decision-making regarding increasing activity.  Recommended physical therapy.  Bilateral  hand numbness This likely carpal tunnel, less likely cervical radiculopathy or myelopathy given other symptoms.  Recommended night splints.  Declined influenza UTD on Pap Tdap given   Return to care in 1 month for virtual check in.   [name], MD   Family Medicine Teaching Service

## 2019-10-23 NOTE — Assessment & Plan Note (Signed)
This likely carpal tunnel, less likely cervical radiculopathy or myelopathy given other symptoms.  Recommended night splints.

## 2019-10-23 NOTE — Assessment & Plan Note (Signed)
Uncertain etiology, no red flags in history.  She has no fevers, weight loss, bowel or bladder incontinence or other concerns.  Differential is broad most likely this is related to hypersensitivity of nervous system possibly fibromyalgia or other connective tissue disorder.  Discussed with patient at length.  Patient would very much like referral to rheumatology.  Discussed low likelihood that this may be rheumatologic condition given her lack of systemic symptoms, family history.  Will obtain basic blood work to further evaluate engage in shared decision-making regarding increasing activity.  Recommended physical therapy.

## 2019-10-23 NOTE — Assessment & Plan Note (Signed)
Given side effect profile medications will check metabolic panel, M0N lipids and lithium level today.

## 2019-10-23 NOTE — Patient Instructions (Addendum)
It was wonderful to see you today.  Thank you for choosing [facility].   Please call [phone] with any questions about today's appointment.  Please be sure to schedule follow up at the front  desk before you leave today.   Dorris Singh, MD  Family Medicine   Please go to the lab today  I will call you with results  Make a follow up in 1 month for a VIRTUAL VISITS

## 2019-10-24 ENCOUNTER — Telehealth: Payer: Self-pay | Admitting: Family Medicine

## 2019-10-24 ENCOUNTER — Encounter: Payer: Self-pay | Admitting: Family Medicine

## 2019-10-24 DIAGNOSIS — M797 Fibromyalgia: Secondary | ICD-10-CM

## 2019-10-24 LAB — HEPATIC FUNCTION PANEL
ALT: 10 IU/L (ref 0–32)
AST: 18 IU/L (ref 0–40)
Albumin: 4.5 g/dL (ref 3.9–5.0)
Alkaline Phosphatase: 119 IU/L — ABNORMAL HIGH (ref 39–117)
Bilirubin Total: 0.6 mg/dL (ref 0.0–1.2)
Bilirubin, Direct: 0.13 mg/dL (ref 0.00–0.40)
Total Protein: 8.2 g/dL (ref 6.0–8.5)

## 2019-10-24 LAB — LIPID PANEL
Chol/HDL Ratio: 4.9 ratio — ABNORMAL HIGH (ref 0.0–4.4)
Cholesterol, Total: 214 mg/dL — ABNORMAL HIGH (ref 100–199)
HDL: 44 mg/dL (ref >39)
LDL Chol Calc (NIH): 149 mg/dL — ABNORMAL HIGH (ref 0–99)
Triglycerides: 114 mg/dL (ref 0–149)
VLDL Cholesterol Cal: 21 mg/dL (ref 5–40)

## 2019-10-24 LAB — CBC
Hematocrit: 38.9 % (ref 34.0–46.6)
Hemoglobin: 13.1 g/dL (ref 11.1–15.9)
MCH: 29.9 pg (ref 26.6–33.0)
MCHC: 33.7 g/dL (ref 31.5–35.7)
MCV: 89 fL (ref 79–97)
Platelets: 252 10*3/uL (ref 150–450)
RBC: 4.38 x10E6/uL (ref 3.77–5.28)
RDW: 13.3 % (ref 11.7–15.4)
WBC: 11.2 10*3/uL — ABNORMAL HIGH (ref 3.4–10.8)

## 2019-10-24 LAB — BASIC METABOLIC PANEL
BUN/Creatinine Ratio: 7 — ABNORMAL LOW (ref 9–23)
BUN: 4 mg/dL — ABNORMAL LOW (ref 6–20)
CO2: 21 mmol/L (ref 20–29)
Calcium: 9.9 mg/dL (ref 8.7–10.2)
Chloride: 104 mmol/L (ref 96–106)
Creatinine, Ser: 0.6 mg/dL (ref 0.57–1.00)
GFR calc Af Amer: 143 mL/min/{1.73_m2} (ref >59)
GFR calc non Af Amer: 124 mL/min/{1.73_m2} (ref >59)
Glucose: 91 mg/dL (ref 65–99)
Potassium: 4.2 mmol/L (ref 3.5–5.2)
Sodium: 142 mmol/L (ref 134–144)

## 2019-10-24 LAB — HEMOGLOBIN A1C
Est. average glucose Bld gHb Est-mCnc: 103 mg/dL
Hgb A1c MFr Bld: 5.2 % (ref 4.8–5.6)

## 2019-10-24 LAB — LITHIUM LEVEL: Lithium Lvl: 1.1 mmol/L (ref 0.6–1.2)

## 2019-10-24 NOTE — Telephone Encounter (Signed)
Called patient regarding results.  Discussed that she has elevated cholesterol.  Reviewed dietary recommendations.  White blood cell count mildly elevated, will continue to monitor.  No other significant abnormalities noted.  Reviewed recommendations at length.  I highly suspect that her excess body weight and mood disorder may be working in combination with syndrome like fibromyalgia to cause her total body pain that she describes.  She has no red flags in terms of symptoms or lab work.  Discussed referral to pain management after extended discussion she would like to see him once.  Referral placed.  Will follow up during December visit regarding this.  All questions were answered we will send letter to.

## 2019-10-25 ENCOUNTER — Ambulatory Visit (INDEPENDENT_AMBULATORY_CARE_PROVIDER_SITE_OTHER): Payer: Medicaid Other | Admitting: Psychiatry

## 2019-10-25 ENCOUNTER — Encounter (HOSPITAL_COMMUNITY): Payer: Self-pay | Admitting: Psychiatry

## 2019-10-25 DIAGNOSIS — F5102 Adjustment insomnia: Secondary | ICD-10-CM

## 2019-10-25 DIAGNOSIS — F3162 Bipolar disorder, current episode mixed, moderate: Secondary | ICD-10-CM

## 2019-10-25 DIAGNOSIS — F411 Generalized anxiety disorder: Secondary | ICD-10-CM

## 2019-10-25 MED ORDER — RISPERIDONE 0.25 MG PO TABS: 0.2500 mg | ORAL_TABLET | Freq: Two times a day (BID) | ORAL | 1 refills | Status: DC

## 2019-10-25 MED ORDER — BENZTROPINE MESYLATE 0.5 MG PO TABS: 0.5000 mg | ORAL_TABLET | Freq: Two times a day (BID) | ORAL | 1 refills | Status: DC

## 2019-10-25 MED ORDER — TRAZODONE HCL 100 MG PO TABS: 100.0000 mg | ORAL_TABLET | Freq: Every day | ORAL | 1 refills | Status: DC

## 2019-10-25 NOTE — Progress Notes (Signed)
[facility] Follow up    Patient Identification: Natalie [name] MRN:  [id number] Date of Evaluation:  10/25/2019 Referral Source: primary care Chief Complaint:   Bipolar follow up     I connected with [name] on 10/25/19 at 10:30 AM EST by a video enabled telemedicine application and verified that I am speaking with the correct person using two identifiers.    I discussed the limitations, risks, security and privacy concerns of performing an evaluation and management service by telephone and the availability of in person appointments. I also discussed with the patient that there may be a patient responsible charge related to this service. The patient expressed understanding and agreed to proceed.  Visit Diagnosis:    ICD-10-CM   1. Bipolar 1 disorder, mixed, moderate (HCC)  F31.62   2. GAD (generalized anxiety disorder)  F41.1   3. Adjustment insomnia  F51.02     History of Present Illness: 28 year old currently married African-American female referred by primary care physician for management of bipolar disorder.  History of Fanapt use but was not covered, med was changed due to prolactin concern  Has a different primary care labs done reviewd by pcp as well  sligh high cholesterol  States lithium 1.1  No side effects reported Low dose risperdal helping She wants to lower lithium but discussed possible next year  meds have been the same no change Mood and bipolar wise doing stable. Husband agrees  Lithium was reviewed, also on cogentin  She and her husband feels risperdal works the best and wants to continue  Modifying factors;diability income  Aggravating factor mental health   Noncompliance or some concerns of side effects from most of the medication or antipsychotics that she has tried  Past Psychiatric History: bipolar    Past Medical History:  Past Medical History:  Diagnosis Date  . Anxiety   . Chronic bipolar disorder (HCC)   . Depression   . H/O  suicide attempt 05-2013   OD  . Obesity   . OCD (obsessive compulsive disorder)   . Psychiatric diagnosis    History reviewed. No pertinent surgical history.  Family Psychiatric History: father : bipolar has been on seroquel  Family History:  Family History  Problem Relation Age of Onset  . Hypertension Mother   . Depression Father   . Hypertension Father   . Hypertension Maternal Grandmother   . Obesity Maternal Grandmother   . Hypertension Paternal Grandmother     Social History:   Social History   Socioeconomic History  . Marital status: Married    Spouse name: Not on file  . Number of children: Not on file  . Years of education: Not on file  . Highest education level: Not on file  Occupational History  . Not on file  Social Needs  . Financial resource strain: Not on file  . Food insecurity    Worry: Not on file    Inability: Not on file  . Transportation needs    Medical: Not on file    Non-medical: Not on file  Tobacco Use  . Smoking status: Never Smoker  . Smokeless tobacco: Never Used  Substance and Sexual Activity  . Alcohol use: No  . Drug use: No  . Sexual activity: Yes    Birth control/protection: I.U.D.  Lifestyle  . Physical activity    Days per week: Not on file    Minutes per session: Not on file  . Stress: Not on file  Relationships  . Social  connections    Talks on phone: Not on file    Gets together: Not on file    Attends religious service: Not on file    Active member of club or organization: Not on file    Attends meetings of clubs or organizations: Not on file    Relationship status: Not on file  Other Topics Concern  . Not on file  Social History Narrative   Husband is [employer], dances, and sings for fun      Allergies:  No Known Allergies  Metabolic Disorder Labs: Lab Results  Component Value Date   HGBA1C 5.2 10/23/2019   MPG 100 09/03/2014   No results found for: PROLACTIN Lab Results   Component Value Date   CHOL 214 (H) 10/23/2019   TRIG 114 10/23/2019   HDL 44 10/23/2019   CHOLHDL 4.9 (H) 10/23/2019   VLDL 18 11/05/2014   LDLCALC 149 (H) 10/23/2019   LDLCALC 67 11/05/2014   Lab Results  Component Value Date   TSH 1.966 12/24/2013    Therapeutic Level Labs: Lab Results  Component Value Date   LITHIUM 1.1 10/23/2019   Lab Results  Component Value Date   CBMZ 3.7 (L) 10/20/2014   Lab Results  Component Value Date   VALPROATE 79.2 07/13/2014    Current Medications: Current Outpatient Medications  Medication Sig Dispense Refill  . acetaminophen (TYLENOL) 500 MG tablet Take 1 tablet (500 mg total) by mouth every 6 (six) hours as needed. 30 tablet 0  . benztropine (COGENTIN) 0.5 MG tablet Take 1 tablet (0.5 mg total) by mouth 2 (two) times daily. 60 tablet 1  . levonorgestrel (MIRENA) 20 MCG/24HR IUD 1 each by Intrauterine route once.    . lithium carbonate (ESKALITH) 450 MG CR tablet Take 1 tablet (450 mg total) by mouth 2 (two) times daily. 180 tablet 0  . risperiDONE (RISPERDAL) 0.25 MG tablet Take 1 tablet (0.25 mg total) by mouth 2 (two) times daily. 60 tablet 1  . traZODone (DESYREL) 100 MG tablet Take 1 tablet (100 mg total) by mouth at bedtime. 30 tablet 1   No current facility-administered medications for this visit.     Musculoskeletal: Denies stiffness, restlessness  Psychiatric Specialty Exam: Review of Systems  Cardiovascular: Negative for chest pain.  Musculoskeletal: Negative for myalgias.  Skin: Negative for rash.  Psychiatric/Behavioral: Negative for depression, hallucinations, substance abuse and suicidal ideas.    There were no vitals taken for this visit.There is no height or weight on file to calculate BMI.  General Appearance: casual  Eye Contact:   Speech:  Slow  Volume:  Normal  Mood: fair  Affect:  Congruent  Thought Process:  Goal Directed  Orientation:  Full (Time, Place, and Person)  Thought Content:  Logical   Suicidal Thoughts:  No  Homicidal Thoughts:  No  Memory:  Immediate;   Fair Recent;   Fair  Judgement:  Fair  Insight:  Shallow  Psychomotor Activity:  Normal  Concentration:  fair  Recall:  [employer] of Knowledge:Fair  Language: Fair  Akathisia:  No  Handed:  Right  AIMS (if indicated):  No abnormal movements  Assets:  Desire for Improvement Housing Social Support  ADL's:  Intact  Cognition: WNL  Sleep:  Fair   Screenings: AUDIT     [facility]) [facility] (Discharged)  from 06/13/2014 in [place] [facility] Admission (Discharged) from 05/29/2014 in [place] [facility] Admission (Discharged) from 12/22/2013 in [place] [facility]  Alcohol Use Disorder Identification Test Final Score (AUDIT)  0  0  0  0  0    GAD-7     Office Visit from 03/19/2017 in [place] for [facility]  Total GAD-7 Score  0    PHQ2-9     Office Visit from 03/19/2017 in [place] for [facility]  PHQ-2 Total Score  0  PHQ-9 Total Score  0      Assessment and Plan: as follows Bipolar disorder, mixed or depressed : doing fair. Continue meds. Should keep lithium same dose for now considering winter, pandemic  May consider lower next spring  Gets labs done by primary care and will follow up for any concerns Labs reviewed No tremors.  She will continue Cogentin states she want to continue twice a day as of now GAD: manageable as long as mood remains balanced, husband is supportive Insomnia:manageable, continue trazadone Denies tremors I discussed the assessment and treatment plan with the patient. The patient was provided an opportunity to ask questions and all were answered. The patient agreed with the plan and demonstrated an understanding  of the instructions.   The patient was advised to call back or seek an in-person evaluation if the symptoms worsen or if the condition fails to improve as anticipated. Fu with primary care as needed  They want to fu in 61m. They want to come in 67m.    Merian Capron, MD 11/25/202010:40 AM

## 2019-11-08 ENCOUNTER — Encounter: Payer: Self-pay | Admitting: Physical Medicine and Rehabilitation

## 2019-11-28 ENCOUNTER — Encounter: Payer: Self-pay | Admitting: Physical Medicine and Rehabilitation

## 2019-12-04 ENCOUNTER — Telehealth (INDEPENDENT_AMBULATORY_CARE_PROVIDER_SITE_OTHER): Payer: Medicaid Other | Admitting: Family Medicine

## 2019-12-04 ENCOUNTER — Encounter: Payer: Self-pay | Admitting: Family Medicine

## 2019-12-04 ENCOUNTER — Encounter (HOSPITAL_COMMUNITY): Payer: Self-pay | Admitting: Psychiatry

## 2019-12-04 ENCOUNTER — Ambulatory Visit (INDEPENDENT_AMBULATORY_CARE_PROVIDER_SITE_OTHER): Payer: Medicaid Other | Admitting: Psychiatry

## 2019-12-04 ENCOUNTER — Other Ambulatory Visit: Payer: Self-pay

## 2019-12-04 DIAGNOSIS — R200 Anesthesia of skin: Secondary | ICD-10-CM | POA: Diagnosis not present

## 2019-12-04 DIAGNOSIS — R52 Pain, unspecified: Secondary | ICD-10-CM

## 2019-12-04 DIAGNOSIS — F5102 Adjustment insomnia: Secondary | ICD-10-CM

## 2019-12-04 DIAGNOSIS — M797 Fibromyalgia: Secondary | ICD-10-CM

## 2019-12-04 DIAGNOSIS — F411 Generalized anxiety disorder: Secondary | ICD-10-CM

## 2019-12-04 DIAGNOSIS — F3162 Bipolar disorder, current episode mixed, moderate: Secondary | ICD-10-CM

## 2019-12-04 MED ORDER — BENZTROPINE MESYLATE 0.5 MG PO TABS: 0.5000 mg | ORAL_TABLET | Freq: Two times a day (BID) | ORAL | 1 refills | Status: DC

## 2019-12-04 MED ORDER — RISPERIDONE 0.25 MG PO TABS: 0.2500 mg | ORAL_TABLET | Freq: Two times a day (BID) | ORAL | 1 refills | Status: DC

## 2019-12-04 MED ORDER — TRAZODONE HCL 100 MG PO TABS: 100.0000 mg | ORAL_TABLET | Freq: Every day | ORAL | 1 refills | Status: DC

## 2019-12-04 NOTE — Assessment & Plan Note (Signed)
Message sent to PMR regarding coverage, will refer to alternative provider if they do not accept insurance.

## 2019-12-04 NOTE — Patient Instructions (Signed)
It was wonderful to talk with you today.  Thank you for choosing [facility].   Please call [phone] with any questions about today's appointment.   [name], MD  Family Medicine       Mediterranean Diet  Why follow it? Research shows. . Those who follow the Mediterranean diet have a reduced risk of heart disease  . The diet is associated with a reduced incidence of Parkinson's and Alzheimer's diseases . People following the diet may have longer life expectancies and lower rates of chronic diseases  . The Dietary Guidelines for Americans recommends the Mediterranean diet as an eating plan to promote health and prevent disease  What Is the Mediterranean Diet?  . Healthy eating plan based on typical foods and recipes of Mediterranean-style cooking . The diet is primarily a plant based diet; these foods should make up a majority of meals   Starches - Plant based foods should make up a majority of meals - They are an important sources of vitamins, minerals, energy, antioxidants, and fiber - Choose whole grains, foods high in fiber and minimally processed items  - Typical grain sources include wheat, oats, barley, corn, Affie Gasner rice, bulgar, farro, millet, polenta, couscous  - Various types of beans include chickpeas, lentils, fava beans, black beans, white beans   Fruits  Veggies - Large quantities of antioxidant rich fruits & veggies; 6 or more servings  - Vegetables can be eaten raw or lightly drizzled with oil and cooked  - Vegetables common to the traditional Mediterranean Diet include: artichokes, arugula, beets, broccoli, brussel sprouts, cabbage, carrots, celery, collard greens, cucumbers, eggplant, kale, leeks, lemons, lettuce, mushrooms, okra, onions, peas, peppers, potatoes, pumpkin, radishes, rutabaga, shallots, spinach, sweet potatoes, turnips, zucchini - Fruits common to the Mediterranean Diet include: apples, apricots, avocados, cherries, clementines,  dates, figs, grapefruits, grapes, melons, nectarines, oranges, peaches, pears, pomegranates, strawberries, tangerines  Fats - Replace butter and margarine with healthy oils, such as olive oil, canola oil, and tahini  - Limit nuts to no more than a handful a day  - Nuts include walnuts, almonds, pecans, pistachios, pine nuts  - Limit or avoid candied, honey roasted or heavily salted nuts - Olives are central to the [employer] - can be eaten whole or used in a variety of dishes   Meats Protein - Limiting red meat: no more than a few times a month - When eating red meat: choose lean cuts and keep the portion to the size of deck of cards - Eggs: approx. 0 to 4 times a week  - Fish and lean poultry: at least 2 a week  - Healthy protein sources include, chicken, [place], lean beef, lamb - Increase intake of seafood such as tuna, salmon, trout, mackerel, shrimp, scallops - Avoid or limit high fat processed meats such as sausage and bacon  Dairy - Include moderate amounts of low fat dairy products  - Focus on healthy dairy such as fat free yogurt, skim milk, low or reduced fat cheese - Limit dairy products higher in fat such as whole or 2% milk, cheese, ice cream  Alcohol - Moderate amounts of red wine is ok  - No more than 5 oz daily for women (all ages) and men older than age 72  - No more than 10 oz of wine daily for men younger than 68  Other - Limit sweets and other desserts  - Use herbs and spices instead of salt to flavor foods  - Herbs  and spices common to the traditional Mediterranean Diet include: basil, bay leaves, chives, cloves, cumin, fennel, garlic, lavender, marjoram, mint, oregano, parsley, pepper, rosemary, sage, savory, sumac, tarragon, thyme   It's not just a diet, it's a lifestyle:  . The Mediterranean diet includes lifestyle factors typical of those in the region  . Foods, drinks and meals are best eaten with others and savored . Daily physical activity is important for  overall good health . This could be strenuous exercise like running and aerobics . This could also be more leisurely activities such as walking, housework, yard-work, or taking the stairs . Moderation is the key; a balanced and healthy diet accommodates most foods and drinks . Consider portion sizes and frequency of consumption of certain foods   Meal Ideas & Options:  . Breakfast:  o Whole wheat toast or whole wheat English muffins with peanut butter & hard boiled egg o Steel cut oats topped with apples & cinnamon and skim milk  o Fresh fruit: banana, strawberries, melon, berries, peaches  o Smoothies: strawberries, bananas, greek yogurt, peanut butter o Low fat greek yogurt with blueberries and granola  o Egg white omelet with spinach and mushrooms o Breakfast couscous: whole wheat couscous, apricots, skim milk, cranberries  . Sandwiches:  o Hummus and grilled vegetables (peppers, zucchini, squash) on whole wheat bread   o Grilled chicken on whole wheat pita with lettuce, tomatoes, cucumbers or tzatziki  o Tuna salad on whole wheat bread: tuna salad made with greek yogurt, olives, red peppers, capers, green onions o Garlic rosemary lamb pita: lamb sauted with garlic, rosemary, salt & pepper; add lettuce, cucumber, greek yogurt to pita - flavor with lemon juice and black pepper  . Seafood:  o Mediterranean grilled salmon, seasoned with garlic, basil, parsley, lemon juice and black pepper o Shrimp, lemon, and spinach whole-grain pasta salad made with low fat greek yogurt  o Seared scallops with lemon orzo  o Seared tuna steaks seasoned salt, pepper, coriander topped with tomato mixture of olives, tomatoes, olive oil, minced garlic, parsley, green onions and cappers  . Meats:  o Herbed greek chicken salad with kalamata olives, cucumber, feta  o Red bell peppers stuffed with spinach, bulgur, lean ground beef (or lentils) & topped with feta   o Kebabs: skewers of chicken, tomatoes, onions,  zucchini, squash  o [place] burgers: made with red onions, mint, dill, lemon juice, feta cheese topped with roasted red peppers . Vegetarian o Cucumber salad: cucumbers, artichoke hearts, celery, red onion, feta cheese, tossed in olive oil & lemon juice  o Hummus and whole grain pita points with a greek salad (lettuce, tomato, feta, olives, cucumbers, red onion) o Lentil soup with celery, carrots made with vegetable broth, garlic, salt and pepper  o Tabouli salad: parsley, bulgur, mint, scallions, cucumbers, tomato, radishes, lemon juice, olive oil, salt and pepper.

## 2019-12-04 NOTE — Addendum Note (Signed)
Addended by: Manson Passey, Romualdo Prosise on: 12/04/2019 04:48 PM   Modules accepted: Orders

## 2019-12-04 NOTE — Assessment & Plan Note (Signed)
Congratulated on weight loss. Encouraged reasonable goal setting. Limit soda to 1 L per day then work down from there.

## 2019-12-04 NOTE — Progress Notes (Signed)
[facility] Telemedicine Visit  Patient consented to have virtual visit. Method of visit: Telephone  Encounter participants: Patient: Natalie [name] - located at home Provider: Westley Chandler - located at [facility] Others (if applicable): none  Chief Complaint: body pain and referrals   HPI:  Natalie [name] is a 29 year old with history of mood disorder presenting for:   Weight Loss  Patient is determined to lose weight. She reports her recent weights are near 250 pounds, she has started using WII to exercise. Thinks she may have lost 10 pounds. Goal weight is 150 pounds. She discussed lithium with her Psychiatrist, this was stopped today. She would like to cut down on soda---currently drinking about 68 ounces of soda daily.   Wrist Pain  Patient has not yet purchased wrist splints. Wrist pain improved a bit recently.   Body Pains and Aches Patient reports [facility] does not take her medicaid insurance. She has Medicaid. Still interested in seeing a Physical Medicine and Rehabilitation physician. Continues to have diffuse pains but improved with weight loss.   ROS: per HPI  Pertinent PMHx:  Bipolar I  Obesity   Exam:  Respiratory: speaking in full sentences   Assessment/Plan:  Morbid obesity (HCC) Congratulated on weight loss. Encouraged reasonable goal setting. Limit soda to 1 L per day then work down from there.   Total body pain Message sent to [facility]R regarding coverage, will refer to alternative provider if they do not accept insurance.    Time spent during visit with patient: 10 minutes

## 2019-12-04 NOTE — Progress Notes (Signed)
[place] Follow up    Patient Identification: Natalie [name] MRN:  [id number] Date of Evaluation:  12/04/2019 Referral Source: primary care Chief Complaint:   Bipolar follow up    I connected with Natalie [name] on 12/04/19 at  8:30 AM EST by a video enabled telemedicine application and verified that I am speaking with the correct person using two identifiers.   I discussed the limitations, risks, security and privacy concerns of performing an evaluation and management service by telephone and the availability of in person appointments. I also discussed with the patient that there may be a patient responsible charge related to this service. The patient expressed understanding and agreed to proceed.  Visit Diagnosis:    ICD-10-CM   1. Bipolar 1 disorder, mixed, moderate (HCC)  F31.62   2. GAD (generalized anxiety disorder)  F41.1   3. Adjustment insomnia  F51.02     History of Present Illness: 29 year old currently married African-American female referred by primary care physician for management of bipolar disorder.  History of Fanapt use but was not covered, med was changed due to prolactin concern  Has a different primary care labs done reviewd by pcp as well Husband was there on video as well Called in with concerns wants to lower lithium, says feels some numbness around fingers at times, read other also want to cut down on her weight. No in coordination, confusion but does not want to get latest labs or levels  Low dose risperdal helping wants to continue   Mood and bipolar mood fair but lithium concern causing self anxiety   No tremors. also on cogentin  She and her husband feels risperdal works the best and wants to continue  Modifying factors; disability income. Aggravating factor mental health   Noncompliance or some concerns of side effects from most of the medication or antipsychotics that she has tried   Past Psychiatric History: bipolar    Past Medical History:   Past Medical History:  Diagnosis Date  . Anxiety   . Chronic bipolar disorder ([place])   . Depression   . H/O suicide attempt 05-2013   OD  . Obesity   . OCD (obsessive compulsive disorder)   . Psychiatric diagnosis    No past surgical history on file.  Family Psychiatric History: father : bipolar has been on seroquel  Family History:  Family History  Problem Relation Age of Onset  . Hypertension Mother   . Depression Father   . Hypertension Father   . Hypertension Maternal Grandmother   . Obesity Maternal Grandmother   . Hypertension Paternal Grandmother     Social History:   Social History   Socioeconomic History  . Marital status: Married    Spouse name: Not on file  . Number of children: Not on file  . Years of education: Not on file  . Highest education level: Not on file  Occupational History  . Not on file  Tobacco Use  . Smoking status: Never Smoker  . Smokeless tobacco: Never Used  Substance and Sexual Activity  . Alcohol use: No  . Drug use: No  . Sexual activity: Yes    Birth control/protection: I.U.D.  Other Topics Concern  . Not on file  Social History Narrative   Husband is [employer], dances, and sings for fun    Social Determinants of Health   Financial Resource Strain:   . Difficulty of Paying Living Expenses: Not on file  Food Insecurity:   .  Worried About Programme researcher, broadcasting/film/video in the Last Year: Not on file  . Ran Out of Food in the Last Year: Not on file  Transportation Needs:   . Lack of Transportation (Medical): Not on file  . Lack of Transportation (Non-Medical): Not on file  Physical Activity:   . Days of Exercise per Week: Not on file  . Minutes of Exercise per Session: Not on file  Stress:   . Feeling of Stress : Not on file  Social Connections:   . Frequency of Communication with Friends and Family: Not on file  . Frequency of Social Gatherings with Friends and Family: Not on file  . Attends Religious  Services: Not on file  . Active Member of Clubs or Organizations: Not on file  . Attends Banker Meetings: Not on file  . Marital Status: Not on file     Allergies:  No Known Allergies  Metabolic Disorder Labs: Lab Results  Component Value Date   HGBA1C 5.2 10/23/2019   MPG 100 09/03/2014   No results found for: PROLACTIN Lab Results  Component Value Date   CHOL 214 (H) 10/23/2019   TRIG 114 10/23/2019   HDL 44 10/23/2019   CHOLHDL 4.9 (H) 10/23/2019   VLDL 18 11/05/2014   LDLCALC 149 (H) 10/23/2019   LDLCALC 67 11/05/2014   Lab Results  Component Value Date   TSH 1.966 12/24/2013    Therapeutic Level Labs: Lab Results  Component Value Date   LITHIUM 1.1 10/23/2019   Lab Results  Component Value Date   CBMZ 3.7 (L) 10/20/2014   Lab Results  Component Value Date   VALPROATE 79.2 07/13/2014    Current Medications: Current Outpatient Medications  Medication Sig Dispense Refill  . acetaminophen (TYLENOL) 500 MG tablet Take 1 tablet (500 mg total) by mouth every 6 (six) hours as needed. 30 tablet 0  . benztropine (COGENTIN) 0.5 MG tablet Take 1 tablet (0.5 mg total) by mouth 2 (two) times daily. 60 tablet 1  . levonorgestrel (MIRENA) 20 MCG/24HR IUD 1 each by Intrauterine route once.    . risperiDONE (RISPERDAL) 0.25 MG tablet Take 1 tablet (0.25 mg total) by mouth 2 (two) times daily. 60 tablet 1  . traZODone (DESYREL) 100 MG tablet Take 1 tablet (100 mg total) by mouth at bedtime. 30 tablet 1   No current facility-administered medications for this visit.    Musculoskeletal: Denies stiffness, restlessness  Psychiatric Specialty Exam: Review of Systems  Cardiovascular: Negative for chest pain.  Skin: Negative for rash.  Psychiatric/Behavioral: Negative for depression, hallucinations, substance abuse and suicidal ideas.    There were no vitals taken for this visit.There is no height or weight on file to calculate BMI.  General Appearance:  casual  Eye Contact:   Speech:  Slow  Volume:  Normal  Mood: fair  Affect:  Congruent  Thought Process:  Goal Directed  Orientation:  Full (Time, Place, and Person)  Thought Content:  Logical  Suicidal Thoughts:  No  Homicidal Thoughts:  No  Memory:  Immediate;   Fair Recent;   Fair  Judgement:  Fair  Insight:  Shallow  Psychomotor Activity:  Normal  Concentration:  fair  Recall:  [employer] of Knowledge:Fair  Language: Fair  Akathisia:  No  Handed:  Right  AIMS (if indicated):  No abnormal movements  Assets:  Desire for Improvement Housing Social Support  ADL's:  Intact  Cognition: WNL  Sleep:  Fair   Screenings:  AUDIT     [facility] (Dis[facility] Ad[facility] (Dis[facility] Ad[facility] (Dis[facility] Ad[facility] (Dis[facility]  Alcohol Use Disorder Identification Test Final Score (AUDIT)  0  0  0  0  0    GAD-7     [facility] Visit from [facility]/19/2017 in [facility]  Total GAD-7 Score  0    PHQ2-9     Office Visit from 03/19/2017 in [facility]  PHQ-2 Total Score  0  PHQ-9 Total Score  0      Assessment and Plan: as follows Bipolar disorder, mixed or depressed : doing fair but wants to lower lithium, see above We discussed to cut down to one a day and get labs work, she does not want to get labs. I prefer to stop lithium then if she cannot get levels to avoid concerns Can stop today has taken one dose today Call back if remains concerns or side effects concerns and to get labs to review  No tremors.  She will continue Cogentin states she want to continue twice a day as of now along with  risperdal Does not want to add or increase risperdal or add any other meds Understands the risk but wants to work on less meds and weight loss.  GAD: manageable, continue meds and distraction from worries Insomnia: manageable, continue trazadone I discussed the assessment and treatment plan with the patient. The patient was provided an opportunity to ask questions and all were answered. The patient agreed with the plan and demonstrated an understanding of the instructions.   The patient was advised to call back or seek an in-person evaluation if the symptoms worsen or if the condition fails to improve as anticipated. Fu with primary care as needed Fu 29m.   Thresa Ross, MD 1/4/20218:42 AM

## 2020-01-04 ENCOUNTER — Other Ambulatory Visit (HOSPITAL_COMMUNITY): Payer: Self-pay

## 2020-01-04 MED ORDER — BENZTROPINE MESYLATE 0.5 MG PO TABS: 0.5000 mg | ORAL_TABLET | Freq: Two times a day (BID) | ORAL | 0 refills | Status: DC

## 2020-01-08 ENCOUNTER — Ambulatory Visit: Payer: Medicaid Other | Admitting: Family Medicine

## 2020-01-19 ENCOUNTER — Encounter: Payer: Self-pay | Admitting: Family Medicine

## 2020-01-19 ENCOUNTER — Telehealth (INDEPENDENT_AMBULATORY_CARE_PROVIDER_SITE_OTHER): Payer: Medicaid Other | Admitting: Family Medicine

## 2020-01-19 ENCOUNTER — Other Ambulatory Visit: Payer: Self-pay

## 2020-01-19 DIAGNOSIS — Z3009 Encounter for other general counseling and advice on contraception: Secondary | ICD-10-CM | POA: Diagnosis not present

## 2020-01-19 DIAGNOSIS — F319 Bipolar disorder, unspecified: Secondary | ICD-10-CM

## 2020-01-19 NOTE — Assessment & Plan Note (Signed)
No longer on lithium, feels well. Has follow up with Psychiatrist. Has lost weight off of lithium.

## 2020-01-19 NOTE — Progress Notes (Signed)
[facility] Telemedicine Visit  Patient consented to have virtual visit. Method of visit: Telephone  Encounter participants: Patient: Natalie [name] - located at home Provider: Westley Chandler - located at [facility] Others (if applicable): none  Chief Complaint: discuss contraception   HPI: Eline Geng is a pleasant 29 year old with history of mood disorder and morbid obesity presenting today to discuss several things.  She very much wants her Mirena removed.  She feels like it is influencing her mood, changing her menses and additionally affecting her relationship with her husband.  She is talked about it with PJ and she would like this removed.  Patient reports she is down to 235 pounds.  She is very pleased about this.  She reports that since coming off the lithium she is felt markedly better.  She is drinking mostly water.  Drinks sodas only 1-2 times a week.  She is exercising with her Wii every other day.  In March and April has been moving apartments which is going to be stressful for her  ROS: per HPI  Pertinent PMHx:  Significant for weight gain on Depo-Provera in the past  Exam:  Respiratory: Speaking in full sentences no distress  Assessment/Plan:  Bipolar 1 disorder (HCC) No longer on lithium, feels well. Has follow up with Psychiatrist. Has lost weight off of lithium.   Morbid obesity (HCC) Congratulated on weight loss. Offered additional resources/medications, she declined.   Encounter for counseling regarding contraception Had lengthy discussion with patient options including injectables, Nexplanon and oral contraceptives and Mirena.  Strongly encouraged her to consider replacement of her Mirena when it is removed.  She has had side effects and does not want this.  She has an extensive discussion with her husband about this.  Will send letter with details of contraceptive options.  She has no contraindications to combined hormonal therapy and  suspect we will go with this.  She does not want a Nexplanon    Time spent during visit with patient: 21 minutes

## 2020-01-19 NOTE — Assessment & Plan Note (Signed)
Congratulated on weight loss. Offered additional resources/medications, she declined.

## 2020-01-19 NOTE — Assessment & Plan Note (Signed)
Had lengthy discussion with patient options including injectables, Nexplanon and oral contraceptives and Mirena.  Strongly encouraged her to consider replacement of her Mirena when it is removed.  She has had side effects and does not want this.  She has an extensive discussion with her husband about this.  Will send letter with details of contraceptive options.  She has no contraindications to combined hormonal therapy and suspect we will go with this.  She does not want a Nexplanon

## 2020-01-24 ENCOUNTER — Other Ambulatory Visit: Payer: Self-pay

## 2020-01-24 ENCOUNTER — Ambulatory Visit (INDEPENDENT_AMBULATORY_CARE_PROVIDER_SITE_OTHER): Payer: Medicaid Other | Admitting: Psychiatry

## 2020-01-24 ENCOUNTER — Encounter (HOSPITAL_COMMUNITY): Payer: Self-pay | Admitting: Psychiatry

## 2020-01-24 DIAGNOSIS — F5102 Adjustment insomnia: Secondary | ICD-10-CM

## 2020-01-24 DIAGNOSIS — F3162 Bipolar disorder, current episode mixed, moderate: Secondary | ICD-10-CM

## 2020-01-24 DIAGNOSIS — F411 Generalized anxiety disorder: Secondary | ICD-10-CM

## 2020-01-24 MED ORDER — RISPERIDONE 0.25 MG PO TABS: 0.2500 mg | ORAL_TABLET | Freq: Two times a day (BID) | ORAL | 1 refills | Status: DC

## 2020-01-24 MED ORDER — TRAZODONE HCL 100 MG PO TABS: 100.0000 mg | ORAL_TABLET | Freq: Every day | ORAL | 1 refills | Status: DC

## 2020-01-24 NOTE — Progress Notes (Signed)
[facility] Follow up    Patient Identification: Natalie [name] MRN:  [id number] Date of Evaluation:  01/24/2020 Referral Source: primary care Chief Complaint:   Bipolar follow up    I connected with Natalie [name] on 01/24/20 at 10:30 AM EST by telephone and verified that I am speaking with the correct person using two identifiers. Video didn't work so was audio appointment    I discussed the limitations, risks, security and privacy concerns of performing an evaluation and management service by telephone and the availability of in person appointments. I also discussed with the patient that there may be a patient responsible charge related to this service. The patient expressed understanding and agreed to proceed.  Visit Diagnosis:    ICD-10-CM   1. Bipolar 1 disorder, mixed, moderate (HCC)  F31.62   2. GAD (generalized anxiety disorder)  F41.1   3. Adjustment insomnia  F51.02     History of Present Illness: 29 year old currently married African-American female referred by primary care physician for management of bipolar disorder.  History of Fanapt use but was not covered, med was changed due to prolactin concern  Last visit wanted to stop lithium was having some side effects and felt anxious Doing fair without lihtium, husband was there and agreed to it On low dose risperdal. No tremors They like where meds are    Mood and bipolar mood fair but lithium concern causing self anxiety   No tremors. also on cogentin  She and her husband feels risperdal works the best and wants to continue  Modifying factors; disability income Aggravating factor mental health, past   Noncompliance or some concerns of side effects from most of the medication or antipsychotics that she has tried   Past Psychiatric History: bipolar    Past Medical History:  Past Medical History:  Diagnosis Date  . Anxiety   . Chronic bipolar disorder (HCC)   . Depression   . H/O suicide attempt 05-2013   OD  .  Obesity   . OCD (obsessive compulsive disorder)   . Psychiatric diagnosis    No past surgical history on file.  Family Psychiatric History: father : bipolar has been on seroquel  Family History:  Family History  Problem Relation Age of Onset  . Hypertension Mother   . Depression Father   . Hypertension Father   . Hypertension Maternal Grandmother   . Obesity Maternal Grandmother   . Hypertension Paternal Grandmother     Social History:   Social History   Socioeconomic History  . Marital status: Married    Spouse name: Not on file  . Number of children: Not on file  . Years of education: Not on file  . Highest education level: Not on file  Occupational History  . Not on file  Tobacco Use  . Smoking status: Never Smoker  . Smokeless tobacco: Never Used  Substance and Sexual Activity  . Alcohol use: No  . Drug use: No  . Sexual activity: Yes    Birth control/protection: I.U.D.  Other Topics Concern  . Not on file  Social History Narrative   Husband is [employer], dances, and sings for fun    Social Determinants of Health   Financial Resource Strain:   . Difficulty of Paying Living Expenses: Not on file  Food Insecurity:   . Worried About Programme researcher, broadcasting/film/video in the Last Year: Not on file  . Ran Out of Food in the Last Year: Not  on file  Transportation Needs:   . Lack of Transportation (Medical): Not on file  . Lack of Transportation (Non-Medical): Not on file  Physical Activity:   . Days of Exercise per Week: Not on file  . Minutes of Exercise per Session: Not on file  Stress:   . Feeling of Stress : Not on file  Social Connections:   . Frequency of Communication with Friends and Family: Not on file  . Frequency of Social Gatherings with Friends and Family: Not on file  . Attends Religious Services: Not on file  . Active Member of Clubs or Organizations: Not on file  . Attends Banker Meetings: Not on file  . Marital  Status: Not on file     Allergies:  No Known Allergies  Metabolic Disorder Labs: Lab Results  Component Value Date   HGBA1C 5.2 10/23/2019   MPG 100 09/03/2014   No results found for: PROLACTIN Lab Results  Component Value Date   CHOL 214 (H) 10/23/2019   TRIG 114 10/23/2019   HDL 44 10/23/2019   CHOLHDL 4.9 (H) 10/23/2019   VLDL 18 11/05/2014   LDLCALC 149 (H) 10/23/2019   LDLCALC 67 11/05/2014   Lab Results  Component Value Date   TSH 1.966 12/24/2013    Therapeutic Level Labs: Lab Results  Component Value Date   LITHIUM 1.1 10/23/2019   Lab Results  Component Value Date   CBMZ 3.7 (L) 10/20/2014   Lab Results  Component Value Date   VALPROATE 79.2 07/13/2014    Current Medications: Current Outpatient Medications  Medication Sig Dispense Refill  . acetaminophen (TYLENOL) 500 MG tablet Take 1 tablet (500 mg total) by mouth every 6 (six) hours as needed. 30 tablet 0  . benztropine (COGENTIN) 0.5 MG tablet Take 1 tablet (0.5 mg total) by mouth 2 (two) times daily. 180 tablet 0  . levonorgestrel (MIRENA) 20 MCG/24HR IUD 1 each by Intrauterine route once.    . risperiDONE (RISPERDAL) 0.25 MG tablet Take 1 tablet (0.25 mg total) by mouth 2 (two) times daily. 60 tablet 1  . traZODone (DESYREL) 100 MG tablet Take 1 tablet (100 mg total) by mouth at bedtime. 30 tablet 1   No current facility-administered medications for this visit.    Musculoskeletal: Denies stiffness, restlessness  Psychiatric Specialty Exam: Review of Systems  Cardiovascular: Negative for chest pain.  Skin: Negative for rash.  Psychiatric/Behavioral: Negative for depression, hallucinations, substance abuse and suicidal ideas.    There were no vitals taken for this visit.There is no height or weight on file to calculate BMI.  General Appearance: casual  Eye Contact:   Speech:  Slow  Volume:  Normal  Mood: fair  Affect:  Congruent  Thought Process:  Goal Directed  Orientation:  Full  (Time, Place, and Person)  Thought Content:  Logical  Suicidal Thoughts:  No  Homicidal Thoughts:  No  Memory:  Immediate;   Fair Recent;   Fair  Judgement:  Fair  Insight:  Shallow  Psychomotor Activity:  Normal  Concentration:  fair  Recall:  [employer] of Knowledge:Fair  Language: Fair  Akathisia:  No  Handed:  Right  AIMS (if indicated):  No abnormal movements  Assets:  Desire for Improvement Housing Social Support  ADL's:  Intact  Cognition: WNL  Sleep:  Fair   Screenings: AUDIT     [facility] Ad[facility] (Discharged) from 06/13/2014 in [place] [facility] Admission (Discharged) from 05/29/2014 in [place] [facility] Admission (Discharged) from 12/22/2013 in [place] [facility]  Alcohol Use Disorder Identification Test Final Score (AUDIT)  0  0  0  0  0    GAD-7     Office Visit from 03/19/2017 in [place] for [facility]  Total GAD-7 Score  0    PHQ2-9     Office Visit from 03/19/2017 in [place] for [facility]  PHQ-2 Total Score  0  PHQ-9 Total Score  0      Assessment and Plan: as follows Bipolar disorder, mixed or depressed :doing fair on risperdal , no side effects Call back if remains concerns or side effects concerns and to get labs to review  No tremors.  She will continue Cogentin states she want to continue twice a day   GAD: manageable, continue to work on coping skills Insomnia: manageable, continue trazadone  I discussed the assessment and treatment plan with the patient. The patient was provided an opportunity to ask questions and all were answered. The patient agreed with the plan and demonstrated an understanding of the instructions.   The patient was advised to call back or seek an  in-person evaluation if the symptoms worsen or if the condition fails to improve as anticipated. Fu with primary care as needed Fu 53m.   Merian Capron, MD 2/24/202110:40 AM

## 2020-02-12 ENCOUNTER — Other Ambulatory Visit (HOSPITAL_COMMUNITY)
Admission: RE | Admit: 2020-02-12 | Discharge: 2020-02-12 | Disposition: A | Discharge status: Home / Self Care | Payer: Medicaid Other | Source: Ambulatory Visit | Attending: Family Medicine | Admitting: Family Medicine

## 2020-02-12 ENCOUNTER — Ambulatory Visit: Payer: Medicaid Other | Admitting: Family Medicine

## 2020-02-12 ENCOUNTER — Encounter: Payer: Self-pay | Admitting: Family Medicine

## 2020-02-12 ENCOUNTER — Other Ambulatory Visit: Payer: Self-pay

## 2020-02-12 VITALS — BP 130/80 | HR 102 | Ht 64.0 in | Wt 236.8 lb

## 2020-02-12 DIAGNOSIS — Z124 Encounter for screening for malignant neoplasm of cervix: Secondary | ICD-10-CM | POA: Insufficient documentation

## 2020-02-12 DIAGNOSIS — Z30432 Encounter for removal of intrauterine contraceptive device: Secondary | ICD-10-CM | POA: Diagnosis not present

## 2020-02-12 DIAGNOSIS — Z3009 Encounter for other general counseling and advice on contraception: Secondary | ICD-10-CM

## 2020-02-12 DIAGNOSIS — Z30011 Encounter for initial prescription of contraceptive pills: Secondary | ICD-10-CM

## 2020-02-12 MED ORDER — NORGESTIMATE-ETH ESTRADIOL 0.25-35 MG-MCG PO TABS: 1.0000 | ORAL_TABLET | Freq: Every day | ORAL | 3 refills | Status: DC

## 2020-02-12 NOTE — Patient Instructions (Addendum)
It was wonderful to see you today.  Thank you for choosing [facility].   Please call [phone] with any questions about today's appointment.  Please be sure to schedule follow up at the front  desk before you leave today.   [name], MD  Family Medicine   Start your pills today  Take each day with some food   Follow up in May for blood work

## 2020-02-12 NOTE — Assessment & Plan Note (Signed)
Discussed increasing activity and reducing sugar sweetened beverages.  The patient has made huge strides in terms of her activity level.  This is improved her overall body pain.

## 2020-02-12 NOTE — Assessment & Plan Note (Signed)
Discussed at length.  She has no contraindications to combined hormonal therapy.  We reviewed the efficacy and safety profile.  Reviewed side effects IUD removed without complication.  Started like a combined hormonal therapy.

## 2020-02-12 NOTE — Progress Notes (Signed)
Subjective  Natalie [name] is a 29 y.o. individual  is presenting with the following: IUD removal and discuss contraception options.  The patient reports she would like her IUD removed.  It has been about 5 and half years.  First this worked very well but when she started her menses again she noticed that her body pain improved.  She thinks she has had multiple side effects from the IUD and very much wants this removed.  She does not want any children at this time.  She has no history of migraine with aura, venous thromboembolic disease in her family or hypertension.  She does not smoke.  She would like to consider pills.  She is currently on the last day of her cycle.  The patient was recently transitioned off the lithium she is taking Risperdal only now.  She is also on trazodone and Cogentin.  Denies side effects.  She is very pleased with her weight loss.  Objective Vital Signs reviewed BP 130/80   Pulse (!) 102   Ht 5\' 4"  (1.626 m)   Wt 236 lb 12.8 oz (107.4 kg)   SpO2 97%   BMI 40.65 kg/m  Cardiac: Warm well perfused.  Capillary refill less than 3 seconds Respiratory breathing comfortably on room air Psych: Pleasant normal affect, appropriate, normal rate of speech GU Exam:  Chaperoned exam.  External exam: Normal-appearing female external genitalia.  Vaginal exam notable for normal appearance.  IUD removed without issue.  IUD was fully intact.  Pap smear obtained. Assessments/Plans  Encounter for counseling regarding contraception Discussed at length.  She has no contraindications to combined hormonal therapy.  We reviewed the efficacy and safety profile.  Reviewed side effects IUD removed without complication.  Started like a combined hormonal therapy.  Morbid obesity (HCC) Discussed increasing activity and reducing sugar sweetened beverages.  The patient has made huge strides in terms of her activity level.  This is improved her overall body pain.  Screening for cervical  cancer, Pap smear performed today will call with results   See after visit summary for details of patient instructions  , MD  [facility] Medicine Teaching Service

## 2020-02-14 LAB — CYTOLOGY - PAP: Diagnosis: NEGATIVE

## 2020-02-28 ENCOUNTER — Telehealth: Payer: Self-pay | Admitting: Family Medicine

## 2020-02-28 ENCOUNTER — Telehealth (HOSPITAL_COMMUNITY): Payer: Self-pay | Admitting: Psychiatry

## 2020-02-28 NOTE — Telephone Encounter (Signed)
Pt is calling and asking to speak with Dr. Manson Passey today. I told her she did not have any virtual appointments available. She asks that she calls when she gets a chance. Patient would only say it is concerning her birth control and that it was very important to speak with her.   I also offered to let patient speak with nurse line concerning her questions with medications.   Please call patient to discuss [phone]

## 2020-02-28 NOTE — Telephone Encounter (Signed)
I have called her. Concerns relavant to discuss with primary care so she will connect with their office

## 2020-02-28 NOTE — Telephone Encounter (Signed)
Called patient---left generic voicemail. I am happy to discuss concerns re: BC with her. Recommend she continue taking these.  [name], MD  Family Medicine Teaching Service

## 2020-02-28 NOTE — Telephone Encounter (Signed)
Pt calling to speak with Dr. Gilmore Laroche She states that she is mentally stable, however physically she is not.  She wanted to talk to Dr. Gilmore Laroche about the Birth Control her PCP put her on. "It is affecting her in a very import physical way".  Patient would not give any further information, but stressed that it was important for her to talk to Dr. Gilmore Laroche As soon as possible.   Advised her to call her PCP and she states she has done that but needed to talk to [place].   Please advise.   CB [phone]

## 2020-02-28 NOTE — Telephone Encounter (Signed)
   [facility] Center Emergency After Hours Call  Patient consented to have virtual visit. Method of visit: Telephone  Encounter participants: Patient: Natalie [name] - located at Home Provider: Dollene Cleveland - located at [facility] Others (if applicable): Janey Genta, Husband  Chief Complaint: N/V  HPI: Patient calls the after hours Emergency Line stating she started having Nausea/Vomiting yesterday morning after she took her Birth Control medicine at 8AM. At the same time she took her other medications for her bipolar disorder with some food. Sometime after she started having nausea, vomiting. "I was in the bathroom for 3 hours". She is scared to take her Birth Control Programmer, multimedia) because she thinks this is the culprit, however this is the first time this happened and she has been on the Sprintec since March 15th. No other issues or complaints at this time.   ROS: per HPI  Pertinent PMHx:  Morbid Obesity Bipolar 1 Disorder OCD  Exam:  Respiratory: comfortable work of breathing, speaking in complete sentences  Assessment/Plan: Nausea/Vomiting: Patient attributes this to Sprintec. Could also consider Gastroenteritis, Foodborne Illness, Food allergy, or other cause of Nausea/Vomiting.  - Encouraged hydration - As this is the 2nd time patient has called today about this issue will get her in for a Virtual Appt tomorrow, 04/01 at 02:10pm.   Time spent during visit with patient: 9 minutes  Peggyann Shoals, DO [facility], PGY-2 02/28/2020 9:25 PM

## 2020-02-28 NOTE — Telephone Encounter (Signed)
Patient calls back to nurse line. Patient stated she has been experiencing NVD since last yesterday. Patient stated she took her birth control pill at 8am with food and became nauseous. Patient stated she then began to have diarrhea and vomiting. Patient stated she has been taking BC pills for 2 weeks and this is the first time this has happened. I advised her to stay hydrated and that she may have a GI bug, or ate something. Patient advised to FU in a few days if she is still experiencing these symptoms and to keep taking her medications as prescribed. Apt made with PCP on 4/12 for lab work, prolactin (PSY) and possible BC switch.

## 2020-02-29 ENCOUNTER — Telehealth (INDEPENDENT_AMBULATORY_CARE_PROVIDER_SITE_OTHER): Payer: Medicaid Other | Admitting: Family Medicine

## 2020-02-29 ENCOUNTER — Other Ambulatory Visit: Payer: Self-pay

## 2020-02-29 ENCOUNTER — Encounter: Payer: Self-pay | Admitting: Family Medicine

## 2020-02-29 VITALS — Wt 234.0 lb

## 2020-02-29 DIAGNOSIS — Z319 Encounter for procreative management, unspecified: Secondary | ICD-10-CM | POA: Diagnosis not present

## 2020-02-29 DIAGNOSIS — F3189 Other bipolar disorder: Secondary | ICD-10-CM | POA: Diagnosis not present

## 2020-02-29 DIAGNOSIS — F319 Bipolar disorder, unspecified: Secondary | ICD-10-CM

## 2020-02-29 DIAGNOSIS — Z3161 Procreative counseling and advice using natural family planning: Secondary | ICD-10-CM

## 2020-02-29 MED ORDER — PRENATA 29-1 MG PO CHEW: 1.0000 | CHEWABLE_TABLET | Freq: Every day | ORAL | 0 refills | Status: DC

## 2020-02-29 NOTE — Assessment & Plan Note (Signed)
Patient wishes to become pregnant, Sprintec taken off medication list and prenatal vitamins ordered.  Patient given basic prenatal counseling regarding communication with physicians but no new medication and avoiding of problematic substances.  Advised that she should take an over-the-counter pregnancy test as she has been nauseous

## 2020-02-29 NOTE — Progress Notes (Signed)
[facility] Medicine Center Telemedicine Visit  Patient consented to have virtual visit. Method of visit: Video was attempted, but technology challenges prevented patient from using video, so visit was conducted via telephone.  Encounter participants: Patient: Natalie [name] - located at home Provider: Marthenia Rolling - located at [facility] clinic Others (if applicable): Husband on the phone, with patient's consent  Chief Complaint: Contraception counseling  HPI:  Patient had been on Mirena but felt that she was having recurring bleeding with this that she did not want so she came off the Mirena.  Was started 15 days ago on Sprintec, said this was going well until the last day or 2 where she said she started having some nausea and vomiting and wants to stop her Sprintec.  She has discussed it with her husband and wishes to become pregnant.  ROS: per HPI  Pertinent PMHx: Bipolar  Exam:  Respiratory: Unable to visualize as technology failed and we were on telephone, but was speaking in full voice with no cough or indication of respiratory distress at this time  Assessment/Plan:  Bipolar 1 disorder (HCC) Given desire for pregnancy, advised to call her psychiatrist and discuss impact of these medications and their risk within pregnancy to see if there are potentially other options for those that might have risk.  She says that her psychiatrist wanted to get a prolactin level due to her being on risperidone, that is ordered as a future lab draw  Desire for pregnancy Patient wishes to become pregnant, Sprintec taken off medication list and prenatal vitamins ordered.  Patient given basic prenatal counseling regarding communication with physicians but no new medication and avoiding of problematic substances.  Advised that she should take an over-the-counter pregnancy test as she has been nauseous    Time spent during visit with patient: 12 minutes

## 2020-02-29 NOTE — Assessment & Plan Note (Signed)
Given desire for pregnancy, advised to call her psychiatrist and discuss impact of these medications and their risk within pregnancy to see if there are potentially other options for those that might have risk.  She says that her psychiatrist wanted to get a prolactin level due to her being on risperidone, that is ordered as a future lab draw

## 2020-03-11 ENCOUNTER — Ambulatory Visit (INDEPENDENT_AMBULATORY_CARE_PROVIDER_SITE_OTHER): Payer: Medicaid Other | Admitting: Family Medicine

## 2020-03-11 ENCOUNTER — Other Ambulatory Visit: Payer: Self-pay

## 2020-03-11 ENCOUNTER — Encounter: Payer: Self-pay | Admitting: Family Medicine

## 2020-03-11 VITALS — BP 125/80 | HR 90 | Ht 64.0 in | Wt 229.8 lb

## 2020-03-11 DIAGNOSIS — Z139 Encounter for screening, unspecified: Secondary | ICD-10-CM

## 2020-03-11 DIAGNOSIS — Z319 Encounter for procreative management, unspecified: Secondary | ICD-10-CM

## 2020-03-11 DIAGNOSIS — Z3009 Encounter for other general counseling and advice on contraception: Secondary | ICD-10-CM | POA: Diagnosis not present

## 2020-03-11 DIAGNOSIS — F319 Bipolar disorder, unspecified: Secondary | ICD-10-CM

## 2020-03-11 NOTE — Progress Notes (Signed)
    SUBJECTIVE:   CHIEF COMPLAINT / HPI:   Natalie [name] is a pleasant 29 year old with history OCD and anxiety presenting today for check up  Weight loss She has been pleased with her overall weight loss.  She has been dancing several times a week.  She is trying to get, African-American exercise.  She is very complaints.  She is limiting sodas.  She is wondering she should drink more milk.    Bipolar disorder The patient is currently on trazodone, benztropine and risperidone.  She is currently trying to conceive as below.  She feels her mood is well controlled.  Her psychiatrist request a prolactin level.  Preconception the patient and her husband would like to start a family.  She has not yet started prenatal vitamins she is interested in the Gummies.  She is not taking any medications other than what is listed above.   PERTINENT  PMH / PSH/Family/Social History : Obesity and bipolar 2  OBJECTIVE:   BP 125/80   Pulse 90   Ht 5\' 4"  (1.626 m)   Wt 229 lb 12.8 oz (104.2 kg)   SpO2 99%   BMI 39.45 kg/m   HEENT: Sclera anicteric. Dentition is moderate. Appears well hydrated. Neck: Supple Cardiac: Regular rate and rhythm. Normal S1/S2. No murmurs, rubs, or gallops appreciated. Lungs: Clear bilaterally to ascultation.  Extremities: Warm, well perfused without edema.  Skin: Warm, dry Psych: Pleasant and appropriate    ASSESSMENT/PLAN:   Morbid obesity (HCC) Discussed goals, discussed importance of optimizing BMI in normal range prior to pregnancy Consider healthy weight and wellness at follow up  A1C today   Desire for pregnancy Discussed weight loss and dietary recommendations prior to pregnancy. Folate discussed.   Bipolar 1 disorder (HCC) Prolactin ordered.      , MD  Family Medicine Teaching Service  [facility]

## 2020-03-11 NOTE — Assessment & Plan Note (Signed)
Prolactin ordered

## 2020-03-11 NOTE — Assessment & Plan Note (Signed)
Discussed weight loss and dietary recommendations prior to pregnancy. Folate discussed.

## 2020-03-11 NOTE — Assessment & Plan Note (Signed)
Discussed goals, discussed importance of optimizing BMI in normal range prior to pregnancy Consider healthy weight and wellness at follow up  A1C today

## 2020-03-11 NOTE — Patient Instructions (Signed)
It was wonderful to see you today.  Please bring ALL of your medications with you to every visit.   Thank you for choosing [facility].   Please call [phone] with any questions about today's appointment.  Please be sure to schedule follow up at the front  desk before you leave today.   [name], MD  Family Medicine    Call at the end of May for an appointment  I will let you know about the prolactin level

## 2020-03-12 ENCOUNTER — Telehealth (HOSPITAL_COMMUNITY): Payer: Self-pay

## 2020-03-12 LAB — PROLACTIN: Prolactin: 34.1 ng/mL — ABNORMAL HIGH (ref 4.8–23.3)

## 2020-03-12 LAB — HEMOGLOBIN A1C
Est. average glucose Bld gHb Est-mCnc: 111 mg/dL
Hgb A1c MFr Bld: 5.5 % (ref 4.8–5.6)

## 2020-03-12 NOTE — Telephone Encounter (Signed)
Patient called asking to speak with Dr. Gilmore Laroche. I explained to her that Dr. Gilmore Laroche is with patients all day and that she has an appointment next week but she insisted on him calling her. She states her PCP told her to speak with Dr. Gilmore Laroche because she is wanting to start a family and needs to know if any medications need to be changed. Patient wants a call back from Dr. Gilmore Laroche.   CB# [phone]

## 2020-03-13 ENCOUNTER — Telehealth: Payer: Self-pay | Admitting: Family Medicine

## 2020-03-13 DIAGNOSIS — F319 Bipolar disorder, unspecified: Secondary | ICD-10-CM

## 2020-03-13 DIAGNOSIS — Z319 Encounter for procreative management, unspecified: Secondary | ICD-10-CM

## 2020-03-13 MED ORDER — BENZTROPINE MESYLATE 0.5 MG PO TABS: 0.5000 mg | ORAL_TABLET | Freq: Every day | ORAL | 0 refills | Status: DC

## 2020-03-13 MED ORDER — TRAZODONE HCL 50 MG PO TABS: 50.0000 mg | ORAL_TABLET | Freq: Every day | ORAL | 0 refills | Status: DC

## 2020-03-13 MED ORDER — RISPERIDONE 0.25 MG PO TABS: 0.2500 mg | ORAL_TABLET | Freq: Two times a day (BID) | ORAL | 0 refills | Status: DC

## 2020-03-13 NOTE — Telephone Encounter (Signed)
I have communicated that there is a risk of being on meds in the first trimester and calculate the risk benefit ration. Husband and her both want to continue meds. They are not Category d or contra indicated I have suggested they also make appointment with OBG and monitor pregnancy from the beginning and get approval of OBG for these meds She has mentioned Dr. Manson Passey agrees doses of risperdal is small and should not be too concerning Other meds like abilify has been tried but she did not tolerate it  Also suggested to cut down the cogentin to once a day and lower trazadone to 50mg  and work on sleep hygiene  Thank you

## 2020-03-13 NOTE — Telephone Encounter (Signed)
Called and referred to Obstetrics.  Prolactin mildly elevated, repeat with TSH at follow up.  All questions answered.  [name], MD  Family Medicine Teaching Service

## 2020-03-15 NOTE — Telephone Encounter (Signed)
Agree with repeat testing as below. Will await further information. [name], MD  Family Medicine Teaching Service

## 2020-03-15 NOTE — Telephone Encounter (Signed)
Patient calls nurse line stating she is two days late. Patient stated she took a test this am, and was negative. Patient was told to report any changes to PCP. Patient is hopeful for pregnancy. Patient advised to take another test in one week. Will forward to PCP.

## 2020-03-19 NOTE — Telephone Encounter (Signed)
Patient calls to give update to PCP. Patient reports that she still has not started menstrual period. Patient states that LMP was on 02/11/20. Patient reports increased appetite, moodiness, and breast fullness. Patient also states that she has had weight loss. Patient reports yesterday's weight was 222 lbs. At last office visit on 4/12, patient was 229 lbs.   Patient states that she is planning on taking repeat pregnancy test on Friday 4/23 and will update PCP accordingly.   To PCP  Veronda Prude, RN

## 2020-03-19 NOTE — Telephone Encounter (Addendum)
Called patient, discussed concerns. Negative pregnancy test, repeat Friday. Will need to keep close eye on mood as just changed medications. We discussed this. Reports mood is 'great' with lots of energy for cleaning, eating in excess. Reviewed reasons to call and return to care. Reviewed signs of depression (has history of bipolar, no mania). Discussed with husband as well.   [name], MD  Family Medicine Teaching Service

## 2020-03-20 ENCOUNTER — Encounter (HOSPITAL_COMMUNITY): Payer: Self-pay | Admitting: Psychiatry

## 2020-03-20 ENCOUNTER — Ambulatory Visit (INDEPENDENT_AMBULATORY_CARE_PROVIDER_SITE_OTHER): Payer: Medicaid Other | Admitting: Psychiatry

## 2020-03-20 DIAGNOSIS — F411 Generalized anxiety disorder: Secondary | ICD-10-CM | POA: Diagnosis not present

## 2020-03-20 DIAGNOSIS — F5102 Adjustment insomnia: Secondary | ICD-10-CM | POA: Diagnosis not present

## 2020-03-20 DIAGNOSIS — F3162 Bipolar disorder, current episode mixed, moderate: Secondary | ICD-10-CM | POA: Diagnosis not present

## 2020-03-20 NOTE — Progress Notes (Signed)
[facility] Follow up    Patient Identification: Natalie [name] MRN:  [id number] Date of Evaluation:  03/20/2020 Referral Source: primary care Chief Complaint:   Bipolar follow up    I connected with [name] on 03/20/20 at  9:30 AM EDT by a video enabled telemedicine application and verified that I am speaking with the correct person using two identifiers.    I discussed the limitations, risks, security and privacy concerns of performing an evaluation and management service by telephone and the availability of in person appointments. I also discussed with the patient that there may be a patient responsible charge related to this service. The patient expressed understanding and agreed to proceed.  Visit Diagnosis:    ICD-10-CM   1. Bipolar 1 disorder, mixed, moderate (HCC)  F31.62   2. GAD (generalized anxiety disorder)  F41.1   3. Adjustment insomnia  F51.02     History of Present Illness: 29 year old currently married African-American female referred by primary care physician for management of bipolar disorder.  History of Fanapt use but was not covered, med was changed due to prolactin concern  Doing fair, planning pregnancy. See last tele note. trazadone and cogentin was cut down. Doing fair and mood is not depressed.  PCP referring for OBG  meds discussed and risks discussed, wants to keep risperdal it is a small dose No tremors. also on cogentin  She and her husband feels risperdal works the best and wants to continue  Modifying factors;  Disability income Aggravating factor mental health, past   Noncompliance or some concerns of side effects from most of the medication or antipsychotics that she has tried   Past Psychiatric History: bipolar    Past Medical History:  Past Medical History:  Diagnosis Date  . Anxiety   . Chronic bipolar disorder ([place])   . Depression   . H/O suicide attempt 05-2013   OD  . Obesity   . OCD (obsessive compulsive disorder)   .  Psychiatric diagnosis    No past surgical history on file.  Family Psychiatric History: father : bipolar has been on seroquel  Family History:  Family History  Problem Relation Age of Onset  . Hypertension Mother   . Depression Father   . Hypertension Father   . Hypertension Maternal Grandmother   . Obesity Maternal Grandmother   . Hypertension Paternal Grandmother     Social History:   Social History   Socioeconomic History  . Marital status: Married    Spouse name: Not on file  . Number of children: Not on file  . Years of education: Not on file  . Highest education level: Not on file  Occupational History  . Not on file  Tobacco Use  . Smoking status: Never Smoker  . Smokeless tobacco: Never Used  Substance and Sexual Activity  . Alcohol use: No  . Drug use: No  . Sexual activity: Yes    Birth control/protection: I.U.D.  Other Topics Concern  . Not on file  Social History Narrative   Husband is [employer], dances, and sings for fun    Social Determinants of Health   Financial Resource Strain:   . Difficulty of Paying Living Expenses:   Food Insecurity:   . Worried About Charity fundraiser in the Last Year:   . Arboriculturist in the Last Year:   Transportation Needs:   . Film/video editor (Medical):   Marland Kitchen Lack of Transportation (  Non-Medical):   Physical Activity:   . Days of Exercise per Week:   . Minutes of Exercise per Session:   Stress:   . Feeling of Stress :   Social Connections:   . Frequency of Communication with Friends and Family:   . Frequency of Social Gatherings with Friends and Family:   . Attends Religious Services:   . Active Member of Clubs or Organizations:   . Attends Banker Meetings:   Marland Kitchen Marital Status:      Allergies:  No Known Allergies  Metabolic Disorder Labs: Lab Results  Component Value Date   HGBA1C 5.5 03/11/2020   MPG 100 09/03/2014   Lab Results  Component Value Date    PROLACTIN 34.1 (H) 03/11/2020   Lab Results  Component Value Date   CHOL 214 (H) 10/23/2019   TRIG 114 10/23/2019   HDL 44 10/23/2019   CHOLHDL 4.9 (H) 10/23/2019   VLDL 18 11/05/2014   LDLCALC 149 (H) 10/23/2019   LDLCALC 67 11/05/2014   Lab Results  Component Value Date   TSH 1.966 12/24/2013    Therapeutic Level Labs: Lab Results  Component Value Date   LITHIUM 1.1 10/23/2019   Lab Results  Component Value Date   CBMZ 3.7 (L) 10/20/2014   Lab Results  Component Value Date   VALPROATE 79.2 07/13/2014    Current Medications: Current Outpatient Medications  Medication Sig Dispense Refill  . acetaminophen (TYLENOL) 500 MG tablet Take 1 tablet (500 mg total) by mouth every 6 (six) hours as needed. 30 tablet 0  . benztropine (COGENTIN) 0.5 MG tablet Take 1 tablet (0.5 mg total) by mouth daily. 30 tablet 0  . Prenatal w/o A Vit-Fe Fum-FA (PRENATAL VITAMIN W/FE, FA) 29-1 MG CHEW Chew 1 tablet by mouth daily. 360 tablet 0  . risperiDONE (RISPERDAL) 0.25 MG tablet Take 1 tablet (0.25 mg total) by mouth 2 (two) times daily. 60 tablet 0  . traZODone (DESYREL) 50 MG tablet Take 1 tablet (50 mg total) by mouth at bedtime. 30 tablet 0   No current facility-administered medications for this visit.    Musculoskeletal: Denies stiffness, restlessness  Psychiatric Specialty Exam: Review of Systems  Cardiovascular: Negative for chest pain.  Skin: Negative for rash.  Psychiatric/Behavioral: Negative for depression, hallucinations, substance abuse and suicidal ideas.    There were no vitals taken for this visit.There is no height or weight on file to calculate BMI.  General Appearance: casual  Eye Contact:   Speech:  Slow  Volume:  Normal  Mood:fair  Affect:  Congruent  Thought Process:  Goal Directed  Orientation:  Full (Time, Place, and Person)  Thought Content:  Logical  Suicidal Thoughts:  No  Homicidal Thoughts:  No  Memory:  Immediate;   Fair Recent;   Fair   Judgement:  Fair  Insight:  Shallow  Psychomotor Activity:  Normal  Concentration:  fair  Recall:  [employer] of Knowledge:Fair  Language: Fair  Akathisia:  No  Handed:  Right  AIMS (if indicated):  No abnormal movements  Assets:  Desire for Improvement Housing Social Support  ADL's:  Intact  Cognition: WNL  Sleep:  Fair   Screenings: AUDIT     [facility]) [facility] Ad[facility] (Dis[facility] Ad[facility] (Dis[facility]) [facility] Ad[facility] (Discharged)  [facility]  Alcohol Use Disorder Identification Test Final Score (AUDIT)  0  0  0  0  0    GAD-7     [facility]19/2017 in [facility]  Total GAD-7 Score  0    PHQ2-9     Office Visit from 02/12/2020 in [facility] Office Visit from 03/19/2017 in [facility]  PHQ-2 Total Score  0  0  PHQ-9 Total Score  --  0      Assessment and Plan: as follows Bipolar disorder, mixed or depressed :doing fair on low dose risperdal. Planning pregnancy. PCP aware meds discussed risks discussed No tremors.  She will continue Cogentin one a day GAD: manageable, continue to work on coping skills Insomnia: manageable, continue trazadone now at 50mg    I discussed the assessment and treatment plan with the patient. The patient was provided an opportunity to ask questions and all were answered. The patient agreed with the plan and demonstrated an understanding of the instructions.   The patient was advised to call back or seek an in-person evaluation if the symptoms worsen or if the condition fails to improve as anticipated. Fu with primary care as needed  and with OBGYn Fu 81m.  Non face to face time spent: 15 min  3m, MD 4/21/20219:41 AM

## 2020-03-22 NOTE — Telephone Encounter (Signed)
Patient requests PCP to return call regarding recent weight loss. Patient reports yesterday's weight 218 pounds and today's weight 217.   Please call patient at [phone]  To PCP  Veronda Prude, RN

## 2020-03-22 NOTE — Telephone Encounter (Signed)
Attempted to call patient- left generic voicemail. If patient calls back, I will try her again next week. Please let her know - She can weigh herself once a week of less - Our weight fluctuates---this normal--1-2 or more pounds per day  Thank you, [name], MD  [facility] Medicine Teaching Service

## 2020-04-08 ENCOUNTER — Other Ambulatory Visit: Payer: Self-pay

## 2020-04-08 ENCOUNTER — Ambulatory Visit (INDEPENDENT_AMBULATORY_CARE_PROVIDER_SITE_OTHER): Payer: Medicaid Other | Admitting: Family Medicine

## 2020-04-08 ENCOUNTER — Encounter: Payer: Self-pay | Admitting: Family Medicine

## 2020-04-08 VITALS — BP 130/80 | HR 97 | Wt 217.0 lb

## 2020-04-08 DIAGNOSIS — F3181 Bipolar II disorder: Secondary | ICD-10-CM | POA: Diagnosis not present

## 2020-04-08 DIAGNOSIS — Z3169 Encounter for other general counseling and advice on procreation: Secondary | ICD-10-CM

## 2020-04-08 DIAGNOSIS — N920 Excessive and frequent menstruation with regular cycle: Secondary | ICD-10-CM

## 2020-04-08 DIAGNOSIS — Z6837 Body mass index (BMI) 37.0-37.9, adult: Secondary | ICD-10-CM | POA: Diagnosis not present

## 2020-04-08 DIAGNOSIS — F319 Bipolar disorder, unspecified: Secondary | ICD-10-CM

## 2020-04-08 DIAGNOSIS — R232 Flushing: Secondary | ICD-10-CM

## 2020-04-08 DIAGNOSIS — Z319 Encounter for procreative management, unspecified: Secondary | ICD-10-CM

## 2020-04-08 NOTE — Assessment & Plan Note (Signed)
Congratulated on weight loss. Discussed health BMI. Current BMI 37.

## 2020-04-08 NOTE — Assessment & Plan Note (Signed)
Given current stressors, recommended establishing with therapist. Recommended she reach out to Dr. Fredda Hammed about trazodone.

## 2020-04-08 NOTE — Progress Notes (Signed)
    SUBJECTIVE:   CHIEF COMPLAINT / HPI:   Natalie [name] is a pleasant 29 year old presenting today to discuss hot flashes. Patient was accompanying her husband to appointment and requests to be seen.  Hot flashes Patient reports on and off hot sweats over last week. She believes this is related to stressors with family. No unintentional weight loss, headaches, hair changes, nuasea, vomiting. LMP 1 week ago, normal.  Menses Patient had IUD removed in March. Has had two cycles since that time. One was light in March. Last period last week. She is using Depends as pads---2-3 per day. No symptoms of anemia including chest pain, dizziness, palpitations, excess fatigue. Negative home pregnancy.   Desire for pregnancy Patient has been tapered off lithium. Continues to risperidone and trazodone. Recommended discussion with OB and Dr. Fredda Hammed regarding safety in pregnancy. Given comorbidities, recommend following with Ob in pregnancy.   Stressors The patient reports considerable stress with her parents. They are demanding (asking) for her social security checks, were at their home at 3 AM last night trying to talk with [place]. Reports intermittent 'rage'. Denies excessing spending, excessive eating, or other signs of mania (husband supports this history). Denies low mood, SI/HI. No alcohol or substance use. Follows with Psychiatry for medications. Does not have established therapist.   PERTINENT  PMH / PSH/Family/Social History : reviewed and updated   OBJECTIVE:   BP 130/80   Pulse 97   Wt 217 lb (98.4 kg)   SpO2 98%   BMI 37.25 kg/m   Cardiac: Warm well perfused.  Capillary refill less than 3 seconds Respiratory breathing comfortably on room air Psych: Pleasant normal affect, appropriate, normal rate of speech  ASSESSMENT/PLAN:   Bipolar 2 disorder (HCC) Given current stressors, recommended establishing with therapist. Recommended she reach out to Dr. Fredda Hammed about trazodone.    Morbid obesity (HCC) Congratulated on weight loss. Discussed health BMI. Current BMI 37.    Hot flashes, differential includes thyroid disease, medication side effect, symptom of anxiety. TSH and CBC today.   Menorrhagia, asked patient to keep log, CBC today.   Preconception counseling, LMP last week, recommended logging menses, if misses period take pregnancy test.  Social stressors, information for counselors given, also provided legal aid information.    HCM Information given for COVID19 vaccine (in partner's AVS)  [name], MD  Family Medicine Teaching Service  [facility]

## 2020-04-08 NOTE — Patient Instructions (Addendum)
It was wonderful to see you today.  Please bring ALL of your medications with you to every visit.   Thank you for choosing [facility].   Please call [phone] with any questions about today's appointment.  Please be sure to schedule follow up at the front  desk before you leave today.   [name], MD  Family Medicine     Go to the lab today  I will call you with results  Schedule your COVID vaccine!!!   [facility]  [address]   [place], [place] [address]      [phone]  [facility] [address],  (Spanish)   [address], [place]: [phone]) 8:30 - 12; 1 - 2:30    Therapy and Counseling Resources Most providers on this list will take Medicaid. Patients with commercial insurance or Medicare should contact their insurance company to get a list of in network providers.   [facility] [address]  [place], [place] [address] [phone]  [facility] [address]  [place], [place] [address]       [phone]     [facility] [address] [place], [place]  [phone]    [facility] [address], [place], [place] [phone]  [facility]:  [address] [place] [phone]  [facility]:  [address], [place] [phone]  [facility] (under & uninsured) [address]   [place] [phone]    [email or web address]    [facility] [place] -[address]     Phone:  [phone]     [facility]-  [address]  [phone]   [facility] [address]      [place], [place] [phone]  [facility]: [address] [place], [place], [place]  [phone]   [facility] (Spanish therapist) [address] [place]  [address]    [place] [address]    [phone]    [facility]   [address],  [place], [place]  [phone]   [facility]  [address] [place]  [phone]  [facility]  [address] [place], [place]        [phone]  [facility] under & uninsured [place], To schedule an appointment call [phone] [address] [place] [phone]):  Macario Golds from 8 AM - 3 PM Moving June 1 to [facility] at [facility] [address]  [facility] [address],  (Spanish)   [address], [place]: [phone]) 8:30 - 12; 1 - 2:30  [facility] [employer],  [address] [place], [place]    [phone]):8:30 - 12; 2 - 3PM  [facility],  [address],  [place]; [phone]):   Mon - Fri 8 AM - 5 PM  [facility] [address] [place]  MWF 12:30 to 3:00 or call to schedule an appointment  [phone]  Specific Provider options Psychology Today  [email or web address]/us 1. click on find a therapist  2. enter your zip code 3. left side and select or tailor a therapist for your specific need.   [facility] Provider Directory [email or web address]  (Medicaid)   Follow  all drop down to find a provider  [place] or [email or web address] [address], [place], [place] Recovery support and educational   In home counseling [place] Telephone: [phone]  office in [place] [email or web address]   Does not take reg. Medicaid or Medicare private insurance [facility], [place] health Choice, [facility], [place], [place], [place], [place] Health Choice  24- Hour Availability:  . [place] or [phone]  . [facility] [employer] [phone]  [facility]  Crisis Service  [phone]   . [place]  [phone] (after hours)  . Therapeutic Alternative/Mobile Crisis   [phone]  . [place] National Suicide Hotline  205-376-0359 ([place])  . Call 911 or go to emergency room  . [employer]  [phone]);  [place] and [employer]   . [facility]  [phone]); [place], [place], [place], [place], [place], [place], [place]

## 2020-04-09 LAB — TSH: TSH: 0.711 u[IU]/mL (ref 0.450–4.500)

## 2020-04-09 LAB — CBC
Hematocrit: 33.8 % — ABNORMAL LOW (ref 34.0–46.6)
Hemoglobin: 11 g/dL — ABNORMAL LOW (ref 11.1–15.9)
MCH: 27.7 pg (ref 26.6–33.0)
MCHC: 32.5 g/dL (ref 31.5–35.7)
MCV: 85 fL (ref 79–97)
Platelets: 345 10*3/uL (ref 150–450)
RBC: 3.97 x10E6/uL (ref 3.77–5.28)
RDW: 12.6 % (ref 11.7–15.4)
WBC: 9.1 10*3/uL (ref 3.4–10.8)

## 2020-04-11 ENCOUNTER — Telehealth (HOSPITAL_COMMUNITY): Payer: Self-pay | Admitting: Psychiatry

## 2020-04-11 MED ORDER — TRAZODONE HCL 50 MG PO TABS: 50.0000 mg | ORAL_TABLET | Freq: Every day | ORAL | 0 refills | Status: DC

## 2020-04-11 NOTE — Telephone Encounter (Signed)
Sent to pharmacy 

## 2020-04-11 NOTE — Telephone Encounter (Signed)
Pt needs refill on trazodone [facility]

## 2020-04-15 ENCOUNTER — Telehealth (HOSPITAL_COMMUNITY): Payer: Self-pay | Admitting: Psychiatry

## 2020-04-15 NOTE — Telephone Encounter (Signed)
she wants her risperdone increased. When asked what the issues are she states "there are no issues but it is too low of a dose". I asked if she was having any side effected and she states she is not.   I asked her why we needed to increase the dose, and she just says ".25mg  is too low. No other reason."  She then gets frustrated and now only wants to talk to Dr. .  Informed patient that you are with patients all day and I will send the message to him for him to advise.   CB # [phone]

## 2020-04-16 NOTE — Telephone Encounter (Signed)
Spoke to pt.  She is aware.  Nothing Further Needed at this time.

## 2020-04-16 NOTE — Telephone Encounter (Signed)
She can start taking one extra tablet of risperdal 0.25mg  at night or during the day.. will talk further next scheduled appointment

## 2020-04-17 ENCOUNTER — Other Ambulatory Visit: Payer: Self-pay

## 2020-04-17 ENCOUNTER — Inpatient Hospital Stay (HOSPITAL_COMMUNITY)
Admission: AD | Admit: 2020-04-17 | Discharge: 2020-05-02 | DRG: 885 | Disposition: A | Discharge status: Home / Self Care | Payer: Medicaid Other | Attending: Psychiatry | Admitting: Psychiatry

## 2020-04-17 DIAGNOSIS — K5900 Constipation, unspecified: Secondary | ICD-10-CM | POA: Diagnosis present

## 2020-04-17 DIAGNOSIS — Z818 Family history of other mental and behavioral disorders: Secondary | ICD-10-CM

## 2020-04-17 DIAGNOSIS — Z79899 Other long term (current) drug therapy: Secondary | ICD-10-CM

## 2020-04-17 DIAGNOSIS — Z6837 Body mass index (BMI) 37.0-37.9, adult: Secondary | ICD-10-CM | POA: Diagnosis not present

## 2020-04-17 DIAGNOSIS — Z20822 Contact with and (suspected) exposure to covid-19: Secondary | ICD-10-CM | POA: Diagnosis present

## 2020-04-17 DIAGNOSIS — F312 Bipolar disorder, current episode manic severe with psychotic features: Principal | ICD-10-CM | POA: Diagnosis present

## 2020-04-17 DIAGNOSIS — F429 Obsessive-compulsive disorder, unspecified: Secondary | ICD-10-CM | POA: Diagnosis present

## 2020-04-17 DIAGNOSIS — Z915 Personal history of self-harm: Secondary | ICD-10-CM | POA: Diagnosis not present

## 2020-04-17 DIAGNOSIS — Z9114 Patient's other noncompliance with medication regimen: Secondary | ICD-10-CM | POA: Diagnosis not present

## 2020-04-17 DIAGNOSIS — R45851 Suicidal ideations: Secondary | ICD-10-CM | POA: Diagnosis present

## 2020-04-17 DIAGNOSIS — F419 Anxiety disorder, unspecified: Secondary | ICD-10-CM | POA: Diagnosis present

## 2020-04-17 DIAGNOSIS — E669 Obesity, unspecified: Secondary | ICD-10-CM | POA: Diagnosis present

## 2020-04-17 DIAGNOSIS — D509 Iron deficiency anemia, unspecified: Secondary | ICD-10-CM | POA: Diagnosis present

## 2020-04-17 DIAGNOSIS — G4700 Insomnia, unspecified: Secondary | ICD-10-CM | POA: Diagnosis present

## 2020-04-17 LAB — SARS CORONAVIRUS 2 BY RT PCR (HOSPITAL ORDER, PERFORMED IN ~~LOC~~ HOSPITAL LAB): SARS Coronavirus 2: NEGATIVE

## 2020-04-17 MED ORDER — BENZTROPINE MESYLATE 0.5 MG PO TABS
0.5000 mg | ORAL_TABLET | Freq: Every day | ORAL | Status: DC
  Filled 2020-04-17 (×2): qty 1

## 2020-04-17 MED ORDER — HYDROXYZINE HCL 25 MG PO TABS
25.0000 mg | ORAL_TABLET | Freq: Three times a day (TID) | ORAL | Status: DC | PRN
  Administered 2020-04-17 – 2020-04-19 (×2): 25 mg via ORAL
  Filled 2020-04-17 (×3): qty 1

## 2020-04-17 MED ORDER — RISPERIDONE 0.25 MG PO TABS
0.2500 mg | ORAL_TABLET | Freq: Two times a day (BID) | ORAL | Status: DC
  Administered 2020-04-18 – 2020-04-19 (×3): 0.25 mg via ORAL
  Filled 2020-04-17 (×7): qty 1

## 2020-04-17 MED ORDER — TRAZODONE HCL 50 MG PO TABS
50.0000 mg | ORAL_TABLET | Freq: Every day | ORAL | Status: DC
  Administered 2020-04-17 – 2020-04-19 (×3): 50 mg via ORAL
  Filled 2020-04-17 (×7): qty 1

## 2020-04-17 NOTE — Progress Notes (Signed)
29 yo female admitted due to paranoid thinking and responding to internal stimuli. During interview, pt very preoccupied, whispering at times, suspicious of others listening to conversation and unintelligible speech at times. Pt required reassurance of safety. Q 15 minute observation rounds initiated per facility protocol. Safety maintained.

## 2020-04-17 NOTE — H&P (Signed)
Behavioral Health Medical Screening Exam  Natalie [name] is an 29 y.o. female patient presents to [facility] under IVC by her husband with complaints that patient has not been taking her medications, paranoia, and fear that patient is danger to herself and other.  Patient states that she is fine and is taking her medications as ordered.  Reporting recent medication changes and needing to adjust to changes "I am now and I'm taking my medicine.  Theres nothing wrong with me; but I cant go back home with my spouse.  He and his family is very controlling.  I don't want to go back there."  Patient states that she was only brought to the hospital for evaluation.  States that she doesn't want to stay at hospital but cant go back home and has no family of friends that she can go with.  During assessment patient is very paranoid, whispering and looking around to see if other people are listening.  Talked patient into staying at hospital since she did not want to go home and had nowhere else to go. Patient states that she is seeing Dr. Lynnae Sandhoff virtually; states she hasn't seen in person    Total Time spent with patient: 30 minutes  Psychiatric Specialty Exam: Physical Exam  Vitals reviewed. Constitutional: She is oriented to person, place, and time. She appears well-nourished.  Obese   Cardiovascular:  Elevated heart rate 136.  Possible related to anxieyt  Respiratory: Effort normal.  Musculoskeletal:        General: Normal range of motion.     Cervical back: Normal range of motion.  Neurological: She is alert and oriented to person, place, and time.  Skin: Skin is warm and dry.  Psychiatric: Her mood appears anxious. Her speech is tangential. She is withdrawn. Thought content is paranoid and delusional. She expresses impulsivity. She expresses no homicidal and no suicidal ideation.  Patient speaking in whisper; not wanting anyone to hear what she is saying    Review of Systems  Constitutional:  Negative for fatigue.  Respiratory: Negative for apnea, chest tightness, shortness of breath and wheezing.   Cardiovascular: Negative for chest pain and leg swelling.  Psychiatric/Behavioral: Hallucinations: Denies. Self-injury: Denies. Suicidal ideas: Denies.       Patient reporting recent medication changes by primary psychiatric Dr. Gilmore Laroche and took her a while to adjust to medication changes  All other systems reviewed and are negative.   Blood pressure 119/81, pulse (!) 136, temperature 99.2 F (37.3 C), temperature source Oral, resp. rate 20, SpO2 91 %.There is no height or weight on file to calculate BMI.  General Appearance: Casual  Eye Contact:  Good  Speech:  Clear and Coherent  Volume:  Decreased  Mood:  Anxious  Affect:  Labile  Thought Process:  Coherent, Goal Directed and Descriptions of Associations: Tangential  Orientation:  Full (Time, Place, and Person)  Thought Content:  Illogical, Delusions and Paranoid Ideation  Suicidal Thoughts:  No  Homicidal Thoughts:  No  Memory:  Immediate;   Fair Recent;   Fair  Judgement:  Impaired  Insight:  Lacking  Psychomotor Activity:  Restlessness  Concentration: Concentration: Fair and Attention Span: Fair  Recall:  [employer] of Knowledge:Fair  Language: Good  Akathisia:  No  Handed:  Right  AIMS (if indicated):     Assets:  Communication Skills Desire for Improvement Housing Social Support  Sleep:       Musculoskeletal: Strength & Muscle Tone: within normal limits Gait &  Station: normal Patient leans: N/A  Blood pressure 119/81, pulse (!) 136, temperature 99.2 F (37.3 C), temperature source Oral, resp. rate 20, SpO2 91 %.  Recommendations:  Inpatient psychiatric treatment, restart home medications  Based on my evaluation the patient does not appear to have an emergency medical condition.  Alexey Rhoads, NP 04/17/2020, 5:33 PM

## 2020-04-17 NOTE — [facility] Counselor (Signed)
Disposition: Natalie Rankin, NP recommends in patient treatment. Patient has been accepted to [facility] pending a negative Covid test. This counselor has completed First Examination to uphold IVC and placed it in her chart on the observation unit.

## 2020-04-17 NOTE — BH Assessment (Signed)
Assessment Note  Natalie [name] is an 29 y.o. female presenting to [facility] under IVC. Per IVC: "She is a danger to herself and others. She is bipolar and may not be taking her medications as she should, did not last night. She hides her medications thinking people are trying to steal it. She is easily agitated or hostile. Threatens to kill her husband. Has piles of clothes everywhere, taking spread off the bed thinking there is someone there. She has been committed in the past. She told family members that she needs help but then unwilling to go in voluntarily."  Upon this counselor's exam patient is calm and cooperative, however appears paranoid. For this reason patient renders limited history. Chart review utilized in conjunction with patient interview to complete assessment. Patient requests door be closed and speaks in a whisper due to concerns others can hear her. Patient looks around room during assessment to make sure no one else is present through out and at times only mouths answers to questions. She states she is here because her husband is controlling and she does not feel safe in her home. She denies SI/HI/AVH at this time. She denies any history of suicide attempts, however, per chart review that is inaccurate. Patient denies any substance use. She reports that she sees Dr. Gilmore Laroche for medication management. She reports that she is having issues with her medications but will not elaborate. Patient does not give consent for TTS to contact her family.  Patient is alert and oriented x 3. She appears dissheveled. Her speech is soft, eye contact is fair, and thoughts are circumstantial. Her mood is anxious and her affect is congruent. She has limited insight, judgement, and impulse control. Patient does not appear to be responding to internal stimuli during assessment. Patient appears delusional.  Diagnosis: Bipolar I, current episode manic, with psychosis  Past Medical History:  Past Medical  History:  Diagnosis Date  . Anxiety   . Chronic bipolar disorder (HCC)   . Depression   . H/O suicide attempt 05-2013   OD  . Obesity   . OCD (obsessive compulsive disorder)   . Psychiatric diagnosis     No past surgical history on file.  Family History:  Family History  Problem Relation Age of Onset  . Hypertension Mother   . Depression Father   . Hypertension Father   . Bipolar disorder Father   . Hypertension Maternal Grandmother   . Obesity Maternal Grandmother   . Hypertension Paternal Grandmother     Social History:  reports that she has never smoked. She has never used smokeless tobacco. She reports that she does not drink alcohol or use drugs.  Additional Social History:  Alcohol / Drug Use Pain Medications: see MAR Prescriptions: see MAR Over the Counter: see MAR History of alcohol / drug use?: No history of alcohol / drug abuse  CIWA: CIWA-Ar BP: 119/81 Pulse Rate: (!) 136([place] Designer, television/film set was notified) COWS:    Allergies: No Known Allergies  Home Medications:  Medications Prior to Admission  Medication Sig Dispense Refill  . acetaminophen (TYLENOL) 500 MG tablet Take 1 tablet (500 mg total) by mouth every 6 (six) hours as needed. 30 tablet 0  . benztropine (COGENTIN) 0.5 MG tablet Take 1 tablet (0.5 mg total) by mouth daily. 30 tablet 0  . Prenatal w/o A Vit-Fe Fum-FA (PRENATAL VITAMIN W/FE, FA) 29-1 MG CHEW Chew 1 tablet by mouth daily. 360 tablet 0  . risperiDONE (RISPERDAL) 0.25 MG tablet Take 1  tablet (0.25 mg total) by mouth 2 (two) times daily. 60 tablet 0  . traZODone (DESYREL) 50 MG tablet Take 1 tablet (50 mg total) by mouth at bedtime. 30 tablet 0    OB/GYN Status:  No LMP recorded. (Menstrual status: Other).  General Assessment Data Location of Assessment: [facility] Assessment Services TTS Assessment: In system Is this a Tele or Face-to-Face Assessment?: Face-to-Face Is this an Initial Assessment or a Re-assessment for this encounter?: Initial  Assessment Patient Accompanied by:: N/A Language Other than English: No Living Arrangements: (private residence) What gender do you identify as?: Female Marital status: Married Pregnancy Status: No Living Arrangements: Spouse/significant other Can pt return to current living arrangement?: Yes Admission Status: Involuntary Petitioner: Family member Is patient capable of signing voluntary admission?: No Referral Source: Self/Family/Friend Insurance type: Medicaid  Medical Screening Exam [facility] Walk-in ONLY) Medical Exam completed: Yes  Crisis Care Plan Living Arrangements: Spouse/significant other Legal Guardian: (self) Name of Psychiatrist: Dr. Gilmore Laroche Name of Therapist: none  Education Status Is patient currently in school?: No Is the patient employed, unemployed or receiving disability?: Receiving disability income  Risk to self with the past 6 months Suicidal Ideation: No Has patient been a risk to self within the past 6 months prior to admission? : No Suicidal Intent: No Has patient had any suicidal intent within the past 6 months prior to admission? : No Is patient at risk for suicide?: No Suicidal Plan?: No Has patient had any suicidal plan within the past 6 months prior to admission? : No Access to Means: No What has been your use of drugs/alcohol within the last 12 months?: denies Previous Attempts/Gestures: No How many times?: 0 Other Self Harm Risks: denies Triggers for Past Attempts: None known Intentional Self Injurious Behavior: None Family Suicide History: No Recent stressful life event(s): Conflict (Comment)(states husband is controlling) Persecutory voices/beliefs?: Yes Depression: Yes Depression Symptoms: Insomnia, Feeling angry/irritable, Feeling worthless/self pity Substance abuse history and/or treatment for substance abuse?: No Suicide prevention information given to non-admitted patients: Not applicable  Risk to Others within the past 6  months Homicidal Ideation: No-Not Currently/Within Last 6 Months Does patient have any lifetime risk of violence toward others beyond the six months prior to admission? : No Thoughts of Harm to Others: No-Not Currently Present/Within Last 6 Months Current Homicidal Intent: No Current Homicidal Plan: No Access to Homicidal Means: No Identified Victim: denies History of harm to others?: No Assessment of Violence: None Noted Violent Behavior Description: denies Does patient have access to weapons?: No Criminal Charges Pending?: No Does patient have a court date: No Is patient on probation?: No  Psychosis Hallucinations: None noted Delusions: Persecutory, Unspecified  Mental Status Report Appearance/Hygiene: Bizarre Eye Contact: Fair Motor Activity: Freedom of movement Speech: Soft, Tangential Level of Consciousness: Alert Mood: Anxious Affect: Anxious Anxiety Level: Moderate Thought Processes: Circumstantial Judgement: Impaired Orientation: Person, Place, Time, Situation Obsessive Compulsive Thoughts/Behaviors: None  Cognitive Functioning Concentration: Normal Memory: Unable to Assess Is patient IDD: No Insight: Poor Impulse Control: Poor Appetite: Good Have you had any weight changes? : No Change Sleep: Decreased Total Hours of Sleep: 3 Vegetative Symptoms: None  ADLScreening [facility] Assessment Services) Patient's cognitive ability adequate to safely complete daily activities?: Yes Patient able to express need for assistance with ADLs?: Yes Independently performs ADLs?: Yes (appropriate for developmental age)  Prior Inpatient Therapy Prior Inpatient Therapy: Yes Prior Therapy Dates: multiple Prior Therapy Facilty/Provider(s): [facility] Reason for Treatment: Bipolar I with psychosis  Prior Outpatient Therapy Prior  Outpatient Therapy: Yes Prior Therapy Dates: ongoing Prior Therapy Facilty/Provider(s): Dr. De Nurse Reason for Treatment: med management Does patient  have an ACCT team?: No Does patient have Intensive In-House Services?  : No Does patient have [facility] services? : No Does patient have P4CC services?: No  ADL Screening (condition at time of admission) Patient's cognitive ability adequate to safely complete daily activities?: Yes Is the patient deaf or have difficulty hearing?: No Does the patient have difficulty seeing, even when wearing glasses/contacts?: No Does the patient have difficulty concentrating, remembering, or making decisions?: No Patient able to express need for assistance with ADLs?: Yes Does the patient have difficulty dressing or bathing?: No Independently performs ADLs?: Yes (appropriate for developmental age) Does the patient have difficulty walking or climbing stairs?: No Weakness of Legs: None Weakness of Arms/Hands: None  Home Assistive Devices/Equipment Home Assistive Devices/Equipment: None  Therapy Consults (therapy consults require a physician order) PT Evaluation Needed: No OT Evalulation Needed: No SLP Evaluation Needed: No Abuse/Neglect Assessment (Assessment to be complete while patient is alone) Abuse/Neglect Assessment Can Be Completed: Yes Physical Abuse: Denies Verbal Abuse: Denies Sexual Abuse: Denies Exploitation of patient/patient's resources: Denies Self-Neglect: Denies Values / Beliefs Cultural Requests During Hospitalization: None Spiritual Requests During Hospitalization: None Consults Spiritual Care Consult Needed: No Transition of Care Team Consult Needed: No Advance Directives (For Healthcare) Does Patient Have a Medical Advance Directive?: No Would patient like information on creating a medical advance directive?: No - Patient declined          Disposition: Shuvon Rankin, NP recommends in patient treatment. Patient has been accepted to [facility] pending a negative Covid test. This counselor has completed First Examination to uphold IVC and placed it in her chart on the  observation unit. Disposition Initial Assessment Completed for this Encounter: Yes Disposition of Patient: Admit Type of inpatient treatment program: Adult Patient refused recommended treatment: No  On Site Evaluation by:   Reviewed with Physician:    Orvis Brill 04/17/2020 6:29 PM

## 2020-04-17 NOTE — Plan of Care (Signed)
[facility] Observation Crisis Plan  Reason for Crisis Plan:  Crisis Stabilization   Plan of Care:  Referral for Telepsychiatry/Psychiatric Consult  Family Support:      Current Living Environment:  Living Arrangements: Spouse/significant other  Insurance:   Hospital Account    Name Acct ID Class Status Primary Coverage   Natalie [name], Natalie [name] [id number] BEHAVIORAL HEALTH OBSERVATION [place]        Guarantor Account (for Hospital Account [id number])    Name Relation to Pt Service Area Active? Acct Type   [name] [facility] Yes Behavioral Health   Address Phone       [address] [place], [place] [address] [phone](H)          Coverage Information (for Hospital Account [id number])    F/O Payor/Plan Precert #   [facility] [place]    Subscriber Subscriber #   Natalie, [name] [id number]   Address Phone   [address] [place], [place] [address] [phone]      Legal Guardian:  Legal Guardian: (self)  Primary Care Provider:  Westley Chandler, MD  Current Outpatient Providers:  unknown  Psychiatrist:  Name of Psychiatrist: Dr. Gilmore Laroche  Counselor/Therapist:  Name of Therapist: none  Compliant with Medications:  No  Additional Information:   [name] 5/19/20218:11 PM

## 2020-04-18 ENCOUNTER — Encounter (HOSPITAL_COMMUNITY): Payer: Self-pay | Admitting: Registered Nurse

## 2020-04-18 DIAGNOSIS — F312 Bipolar disorder, current episode manic severe with psychotic features: Principal | ICD-10-CM

## 2020-04-18 MED ORDER — ZIPRASIDONE MESYLATE 20 MG IM SOLR: 20.0000 mg | INTRAMUSCULAR | Status: DC | PRN

## 2020-04-18 MED ORDER — BENZTROPINE MESYLATE 0.5 MG PO TABS: 0.5000 mg | ORAL_TABLET | Freq: Every day | ORAL | Status: DC | PRN

## 2020-04-18 MED ORDER — LORAZEPAM 1 MG PO TABS
1.0000 mg | ORAL_TABLET | ORAL | Status: AC | PRN
  Administered 2020-04-18: 1 mg via ORAL
  Filled 2020-04-18: qty 1

## 2020-04-18 MED ORDER — RISPERIDONE 2 MG PO TBDP
2.0000 mg | ORAL_TABLET | Freq: Three times a day (TID) | ORAL | Status: DC | PRN
  Administered 2020-04-18 – 2020-05-01 (×4): 2 mg via ORAL
  Filled 2020-04-18 (×5): qty 2

## 2020-04-18 MED ORDER — RISPERIDONE 1 MG PO TBDP
1.0000 mg | ORAL_TABLET | ORAL | Status: AC
  Administered 2020-04-18: 1 mg via ORAL
  Filled 2020-04-18 (×2): qty 1

## 2020-04-18 NOTE — Plan of Care (Signed)
Progress note  D: pt found in bed; compliant with medication administration. Pt is concrete in thinking, and continues to be preoccupied with medications. Pt states they feel their family is changing and altering their medications. Pt is fidgety and restless, refusing to rest or sit down. Pt is paranoid and watchful of others and staff. Pt continues to refuse to use their room while they have a roommate. Pt denies si/hi/ah/vh and verbally agrees to approach staff if these become apparent or before harming themself/others while at bhh.  A: Pt provided support and encouragement. Pt given medication per protocol and standing orders. Q65m safety checks implemented and continued.  R: Pt safe on the unit. Will continue to monitor.  Pt progressing in the following metrics  Problem: Education: Goal: Knowledge of [facility] Education information/materials will improve Outcome: Progressing Goal: Emotional status will improve Outcome: Progressing Goal: Verbalization of understanding the information provided will improve Outcome: Progressing   Problem: Coping: Goal: Ability to demonstrate self-control will improve Outcome: Progressing

## 2020-04-18 NOTE — [facility] Counselor (Signed)
Patient requested to speak with CSW twice on the unit, each time CSW spoke with patient, patient was silent and appeared to be guarded or anxious.  CSW will attempt to meet with patient at a later time to complete PSA.  Enid Cutter, MSW, LCSW-A Clinical Social Worker [facility] Adult Unit

## 2020-04-18 NOTE — [facility] Counselor (Signed)
A caller who identified himself as patient's spouse, Mr.Woodrow [phone]), requested to speak with nursing staff to share information about patient and her medication. Caller did not have patient's code and is on on patient's consent form at the nurse's station.   Caller shared that patient was recently "taken off lithium and her new meds aren't holding her." CSW will encourage patient to share her code with supports.  Enid Cutter, MSW, LCSW-A Clinical Social Worker [facility] Adult Unit

## 2020-04-18 NOTE — [facility] Suicide Risk Assessment (Signed)
[facility] INPATIENT:  Family/Significant Other Suicide Prevention Education  Suicide Prevention Education:  Patient Refusal for Family/Significant Other Suicide Prevention Education: The patient Natalie [name] has refused to provide written consent for family/significant other to be provided Family/Significant Other Suicide Prevention Education during admission and/or prior to discharge.  Physician notified.  Darreld Mclean 04/18/2020, 4:44 PM

## 2020-04-18 NOTE — Progress Notes (Signed)
Patient ID: Natalie [name], female   DOB: [birth year], 29 y.o.   MRN: [id number] Admission Note:  D:28 yr female who presents IVC in no acute distress for the treatment of psychosis / paranoia. Pt appears flat and depressed. Pt very paranoid during admission process. Pt convinced to sign paperwork. Pt too paranoid to sleep in the room with a roommate. Pt offered the quiet room , pt too paranoid to sleep in there. Pt encouraged to sleep in there for tonight and talk to the doctor in the morning. Pt concerned about the people on the unit , pt appears to need her own room and appears to need to be a no roommate.   B:Skin was assessed by observation staff.  Consents obtained. Food and fluids offered, and fluids accepted.  R: Pt had no additional questions or concerns.

## 2020-04-18 NOTE — Progress Notes (Signed)
Recreation Therapy Notes  Date: 5.20.21 Time: 0955 Location: 500 Hall Dayroom  Group Topic: Communication, Team Building, Problem Solving  Goal Area(s) Addresses:  Patient will effectively work with peer towards shared goal.  Patient will identify skill used to make activity successful.  Patient will identify how skills used during activity can be used to reach post d/c goals.   Intervention: STEM Activity   Activity: In team's, patients were given 15 straws and 70ft of masking tape.  Patients were to build a free standing bridge that could hold 300 piece puzzle box.  Education: Pharmacist, community, Building control surveyor.   Education Outcome: Acknowledges education/In group clarification offered/Needs additional education.   Clinical Observations/Feedback: Pt did not attend group session.     Caroll Rancher, LRT/CTRS    Caroll Rancher A 04/18/2020 11:58 AM

## 2020-04-18 NOTE — Progress Notes (Signed)
   04/18/20 2000  Psych Admission Type (Psych Patients Only)  Admission Status Involuntary  Psychosocial Assessment  Patient Complaints Anxiety;Confusion;Suspiciousness;Worrying  Biomedical scientist;Intense  Facial Expression Anxious;Pensive;Worried  Affect Anxious;Apprehensive;Preoccupied  Speech Logical/coherent;Tangential  Interaction Assertive;Intrusive  Motor Activity Fidgety;Pacing;Restless  Appearance/Hygiene In hospital gown  Behavior Characteristics Cooperative;Anxious;Restless  Mood Anxious;Suspicious;Preoccupied;Pleasant  Thought Process  Coherency Concrete thinking;Disorganized  Content Blaming self;Compulsions;Obsessions;Preoccupation  Delusions Controlled;Paranoid;Persecutory  Perception WDL  Hallucination None reported or observed  Judgment Poor  Confusion Mild  Danger to Self  Current suicidal ideation? Denies  Danger to Others  Danger to Others None reported or observed   Pt preoccupied and paranoid. Pt obsessing about her room and cleanliness: "I don't know all the rules and I need to know what to do." Pt linen was changed this shift for her. Pt wrote note about a complaint she had on admission. This note photocopied and given to [facility]. Original placed in shadow chart. Pt worried about her spouse. Believes he IVC'd her for "treatment" but really doesn't want to be married to her anymore. Pt states she is confused and overwhelmed at this time. Emotional support given to pt.

## 2020-04-18 NOTE — Tx Team (Signed)
Initial Treatment Plan 04/18/2020 12:32 AM Natalie [name] PRA:[id number]    PATIENT STRESSORS: Marital or family conflict Medication change or noncompliance   PATIENT STRENGTHS: General fund of knowledge Motivation for treatment/growth   PATIENT IDENTIFIED PROBLEMS:  Paranoia  Psychosis  "nothing"                 DISCHARGE CRITERIA:  Improved stabilization in mood, thinking, and/or behavior Verbal commitment to aftercare and medication compliance  PRELIMINARY DISCHARGE PLAN: Attend PHP/IOP Outpatient therapy  PATIENT/FAMILY INVOLVEMENT: This treatment plan has been presented to and reviewed with the patient, Natalie [name].  The patient and family have been given the opportunity to ask questions and make suggestions.  Delos Haring, RN 04/18/2020, 12:32 AM

## 2020-04-18 NOTE — H&P (Signed)
Psychiatric Admission Assessment Adult  Patient Identification: Natalie [name] MRN:  [id number] Date of Evaluation:  04/18/2020 Chief Complaint:  Bipolar disorder, current episode manic severe with psychotic features (HCC) [F31.2] Principal Diagnosis: <principal problem not specified> Diagnosis:  Active Problems:   Bipolar disorder, current episode manic severe with psychotic features (HCC)  History of Present Illness: Patient is seen and examined.  Patient is a 29 year old female with a past psychiatric history significant for reported bipolar disorder, but really appears more schizoaffective disorder; bipolar type who presented to the behavioral health hospital under involuntary commitment last night.  The involuntary commitment stated that the patient had not been compliant with medications and had becoming more paranoid.  On interview this morning the patient stated that someone is "getting into the system" and changing her medicines.  These are not the medicines that she feels as though are hers.  She thinks are being switched.  She thinks that people are manipulating things behind her back.  She is significantly paranoid and guarded.  She does not know if she can trust me.  She wants to talk to nursing or security guard to warn them.  She is followed by Dr. Gilmore Laroche as an outpatient.  It looks like from the electronic medical record her last psychiatric hospitalization was on 08/31/2014.  Her discharge medications at that time included Cogentin, Tegretol, hydroxyzine, Lamictal, trazodone and Geodon.  She was admitted to the hospital to treat her psychosis, paranoia.  Associated Signs/Symptoms: Depression Symptoms:  depressed mood, anhedonia, insomnia, feelings of worthlessness/guilt, difficulty concentrating, hopelessness, suicidal thoughts without plan, anxiety, loss of energy/fatigue, disturbed sleep, (Hypo) Manic Symptoms:  Delusions, Distractibility, Impulsivity, Irritable  Mood, Labiality of Mood, Anxiety Symptoms:  Excessive Worry, Psychotic Symptoms:  Delusions, Paranoia, PTSD Symptoms: Negative Total Time spent with patient: 45 minutes  Past Psychiatric History: Patient is followed by Dr.Ahktar at our outpatient clinic.  From her old records she has been previously treated with Seroquel, Abilify, Zyprexa, Saphris, Fanapt, Invega, Risperdal, Tegretol, Lamictal, trazodone, lithium.  Her last psychiatric hospitalization at our facility was November 2015.  Is the patient at risk to self? Yes.    Has the patient been a risk to self in the past 6 months? No.  Has the patient been a risk to self within the distant past? Yes.    Is the patient a risk to others? No.  Has the patient been a risk to others in the past 6 months? No.  Has the patient been a risk to others within the distant past? No.   Prior Inpatient Therapy: Prior Inpatient Therapy: Yes Prior Therapy Dates: multiple Prior Therapy Facilty/Provider(s): [facility] Reason for Treatment: Bipolar I with psychosis Prior Outpatient Therapy: Prior Outpatient Therapy: Yes Prior Therapy Dates: ongoing Prior Therapy Facilty/Provider(s): Dr. Gilmore Laroche Reason for Treatment: med management Does patient have an ACCT team?: No Does patient have Intensive In-House Services?  : No Does patient have [facility] services? : No Does patient have P4CC services?: No  Alcohol Screening: 1. How often do you have a drink containing alcohol?: Never 2. How many drinks containing alcohol do you have on a typical day when you are drinking?: 1 or 2 3. How often do you have six or more drinks on one occasion?: Never AUDIT-C Score: 0 4. How often during the last year have you found that you were not able to stop drinking once you had started?: Never 5. How often during the last year have you failed to do what was normally expected  from you because of drinking?: Never 6. How often during the last year have you needed a first drink  in the morning to get yourself going after a heavy drinking session?: Never 7. How often during the last year have you had a feeling of guilt of remorse after drinking?: Never 8. How often during the last year have you been unable to remember what happened the night before because you had been drinking?: Never 9. Have you or someone else been injured as a result of your drinking?: No 10. Has a relative or friend or a doctor or another health worker been concerned about your drinking or suggested you cut down?: No Alcohol Use Disorder Identification Test Final Score (AUDIT): 0 Alcohol Brief Interventions/Follow-up: Patient Refused Substance Abuse History in the last 12 months:  No. Consequences of Substance Abuse: Negative Previous Psychotropic Medications: Yes  Psychological Evaluations: Yes  Past Medical History:  Past Medical History:  Diagnosis Date  . Anxiety   . Chronic bipolar disorder (HCC)   . Depression   . H/O suicide attempt 05-2013   OD  . Obesity   . OCD (obsessive compulsive disorder)   . Psychiatric diagnosis    History reviewed. No pertinent surgical history. Family History:  Family History  Problem Relation Age of Onset  . Hypertension Mother   . Depression Father   . Hypertension Father   . Bipolar disorder Father   . Hypertension Maternal Grandmother   . Obesity Maternal Grandmother   . Hypertension Paternal Grandmother    Family Psychiatric  History: Negative Tobacco Screening: Have you used any form of tobacco in the last 30 days? (Cigarettes, Smokeless Tobacco, Cigars, and/or Pipes): No Social History:  Social History   Substance and Sexual Activity  Alcohol Use No     Social History   Substance and Sexual Activity  Drug Use No    Additional Social History: Marital status: Married    Pain Medications: see MAR Prescriptions: see MAR Over the Counter: see MAR History of alcohol / drug use?: No history of alcohol / drug abuse                     Allergies:  No Known Allergies Lab Results:  Results for orders placed or performed during the hospital encounter of 04/17/20 (from the past 48 hour(s))  SARS Coronavirus 2 by RT PCR (hospital order, performed in [facility] hospital lab) Nasopharyngeal Nasopharyngeal Swab     Status: None   Collection Time: 04/17/20  6:53 PM   Specimen: Nasopharyngeal Swab  Result Value Ref Range   SARS Coronavirus 2 NEGATIVE NEGATIVE    Comment: (NOTE) SARS-CoV-2 target nucleic acids are NOT DETECTED. The SARS-CoV-2 RNA is generally detectable in upper and lower respiratory specimens during the acute phase of infection. The lowest concentration of SARS-CoV-2 viral copies this assay can detect is 250 copies / mL. A negative result does not preclude SARS-CoV-2 infection and should not be used as the sole basis for treatment or other patient management decisions.  A negative result may occur with improper specimen collection / handling, submission of specimen other than nasopharyngeal swab, presence of viral mutation(s) within the areas targeted by this assay, and inadequate number of viral copies (<250 copies / mL). A negative result must be combined with clinical observations, patient history, and epidemiological information. Fact Sheet for Patients:   BoilerBrush.com.cy Fact Sheet for Healthcare Providers: https://pope.com/ This test is not yet approved or cleared  by the [place] FDA and has been authorized for detection and/or diagnosis of SARS-CoV-2 by FDA under an Emergency Use Authorization (EUA).  This EUA will remain in effect (meaning this test can be used) for the duration of the COVID-19 declaration under Section 564(b)(1) of the Act, 21 U.S.C. section 360bbb-3(b)(1), unless the authorization is terminated or revoked sooner. Performed at [facility], [address] [place], [place] [address]      Blood Alcohol level:  Lab Results  Component Value Date   [facility] <11 10/20/2014   ETH <11 10/09/2014    Metabolic Disorder Labs:  Lab Results  Component Value Date   HGBA1C 5.5 03/11/2020   MPG 100 09/03/2014   Lab Results  Component Value Date   PROLACTIN 34.1 (H) 03/11/2020   Lab Results  Component Value Date   CHOL 214 (H) 10/23/2019   TRIG 114 10/23/2019   HDL 44 10/23/2019   CHOLHDL 4.9 (H) 10/23/2019   VLDL 18 11/05/2014   LDLCALC 149 (H) 10/23/2019   LDLCALC 67 11/05/2014    Current Medications: Current Facility-Administered Medications  Medication Dose Route Frequency Provider Last Rate Last Admin  . benztropine (COGENTIN) tablet 0.5 mg  0.5 mg Oral Daily PRN Antonieta Pert, MD      . hydrOXYzine (ATARAX/VISTARIL) tablet 25 mg  25 mg Oral TID PRN Rankin, Shuvon B, NP   25 mg at 04/17/20 2242  . risperiDONE (RISPERDAL M-TABS) disintegrating tablet 2 mg  2 mg Oral Q8H PRN Antonieta Pert, MD       And  . LORazepam (ATIVAN) tablet 1 mg  1 mg Oral PRN Antonieta Pert, MD       And  . ziprasidone (GEODON) injection 20 mg  20 mg Intramuscular PRN Antonieta Pert, MD      . risperiDONE (RISPERDAL) tablet 0.25 mg  0.25 mg Oral BID Rankin, Shuvon B, NP   0.25 mg at 04/18/20 0819  . traZODone (DESYREL) tablet 50 mg  50 mg Oral QHS Rankin, Shuvon B, NP   50 mg at 04/17/20 2243   PTA Medications: Medications Prior to Admission  Medication Sig Dispense Refill Last Dose  . acetaminophen (TYLENOL) 500 MG tablet Take 1 tablet (500 mg total) by mouth every 6 (six) hours as needed. 30 tablet 0 Past Month at Unknown time  . benztropine (COGENTIN) 0.5 MG tablet Take 1 tablet (0.5 mg total) by mouth daily. 30 tablet 0   . Prenatal w/o A Vit-Fe Fum-FA (PRENATAL VITAMIN W/FE, FA) 29-1 MG CHEW Chew 1 tablet by mouth daily. 360 tablet 0   . risperiDONE (RISPERDAL) 0.25 MG tablet Take 1 tablet (0.25 mg total) by mouth 2 (two) times daily. 60 tablet 0   . traZODone  (DESYREL) 50 MG tablet Take 1 tablet (50 mg total) by mouth at bedtime. 30 tablet 0     Musculoskeletal: Strength & Muscle Tone: within normal limits Gait & Station: normal Patient leans: N/A  Psychiatric Specialty Exam: Physical Exam  Constitutional: She appears well-developed and well-nourished.  HENT:  Head: Normocephalic and atraumatic.  Respiratory: Effort normal.  Neurological: She is alert.    Review of Systems  Blood pressure 124/71, pulse (!) 128, temperature 99.5 F (37.5 C), temperature source Oral, resp. rate 20, height 5\' 2"  (1.575 m), weight 93 kg, SpO2 91 %.Body mass index is 37.49 kg/m.  General Appearance: Disheveled  Eye Contact:  Fair  Speech:  Normal Rate  Volume:  Increased  Mood:  Anxious, Dysphoric  and Irritable  Affect:  Labile  Thought Process:  Goal Directed and Descriptions of Associations: Loose  Orientation:  Full (Time, Place, and Person)  Thought Content:  Delusions, Paranoid Ideation and Rumination  Suicidal Thoughts:  No  Homicidal Thoughts:  No  Memory:  Immediate;   Fair Recent;   Fair Remote;   Fair  Judgement:  Impaired  Insight:  Lacking  Psychomotor Activity:  Increased  Concentration:  Concentration: Fair and Attention Span: Fair  Recall:  [employer] of Knowledge:  Fair  Language:  Fair  Akathisia:  Negative  Handed:  Right  AIMS (if indicated):     Assets:  Desire for Improvement Housing Resilience  ADL's:  Intact  Cognition:  WNL  Sleep:  Number of Hours: 1.75    Treatment Plan Summary: Daily contact with patient to assess and evaluate symptoms and progress in treatment, Medication management and Plan : Patient is seen and examined.  Patient is a 29 year old female with the above-stated past psychiatric history who was admitted secondary to paranoia and worsening bipolar symptoms.  She will be admitted to the hospital.  She will be integrated in the milieu.  She will be encouraged to attend groups.  She will be restarted  on her Risperdal, but we may need to increase the dosage and as well add a mood stabilizer depending on how she responds.  The only laboratories that we have available right now are a TSH that is 0.711 and a CBC that shows a mild anemia with a hemoglobin of 11 and hematocrit of 33.8.  Her platelets are 345,000.  She is tachycardic with a rate of 128.  Her blood pressure stable and she has a low-grade temperature of 99.5.  Nursing notes reflect she only slept 1.75 hours last night.  Hopefully we can stabilize the situation.  Observation Level/Precautions:  15 minute checks  Laboratory:  Chemistry Profile  Psychotherapy:    Medications:    Consultations:    Discharge Concerns:    Estimated LOS:  Other:     Physician Treatment Plan for Primary Diagnosis: <principal problem not specified> Long Term Goal(s): Improvement in symptoms so as ready for discharge  Short Term Goals: Ability to identify changes in lifestyle to reduce recurrence of condition will improve, Ability to verbalize feelings will improve, Ability to demonstrate self-control will improve, Ability to identify and develop effective coping behaviors will improve, Ability to maintain clinical measurements within normal limits will improve and Compliance with prescribed medications will improve  Physician Treatment Plan for Secondary Diagnosis: Active Problems:   Bipolar disorder, current episode manic severe with psychotic features ([place])  Long Term Goal(s): Improvement in symptoms so as ready for discharge  Short Term Goals: Ability to identify changes in lifestyle to reduce recurrence of condition will improve, Ability to verbalize feelings will improve, Ability to demonstrate self-control will improve, Ability to identify and develop effective coping behaviors will improve, Ability to maintain clinical measurements within normal limits will improve and Compliance with prescribed medications will improve  I certify that inpatient  services furnished can reasonably be expected to improve the patient's condition.    Sharma Covert, MD 5/20/20211:57 PM

## 2020-04-18 NOTE — Progress Notes (Addendum)
Recreation Therapy Notes  05.20.21 0900: LRT attempted to complete assessment with patient.  Patient unable to complete assessment at this time due to paranoia and inability to focus on questions.  LRT will attempt to complete assessment at a later time.    Caroll Rancher, LRT/CTRS    Caroll Rancher A 04/18/2020 12:03 PM
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
## 2020-04-18 NOTE — [facility] Suicide Risk Assessment (Signed)
[facility] Admission Suicide Risk Assessment   Nursing information obtained from:  Patient, Review of record Demographic factors:  NA Current Mental Status:  NA Loss Factors:  NA Historical Factors:  Impulsivity Risk Reduction Factors:  Living with another person, especially a relative  Total Time spent with patient: 30 minutes Principal Problem: <principal problem not specified> Diagnosis:  Active Problems:   Bipolar disorder, current episode manic severe with psychotic features (HCC)  Subjective Data: Patient is seen and examined.  Patient is a 29 year old female with a past psychiatric history significant for reported bipolar disorder, but really appears more schizoaffective disorder; bipolar type who presented to the behavioral health hospital under involuntary commitment last night.  The involuntary commitment stated that the patient had not been compliant with medications and had becoming more paranoid.  On interview this morning the patient stated that someone is "getting into the system" and changing her medicines.  These are not the medicines that she feels as though are hers.  She thinks are being switched.  She thinks that people are manipulating things behind her back.  She is significantly paranoid and guarded.  She does not know if she can trust me.  She wants to talk to nursing or security guard to warn them.  She is followed by Dr. Gilmore Laroche as an outpatient.  It looks like from the electronic medical record her last psychiatric hospitalization was on 08/31/2014.  Her discharge medications at that time included Cogentin, Tegretol, hydroxyzine, Lamictal, trazodone and Geodon.  She was admitted to the hospital to treat her psychosis, paranoia.  Continued Clinical Symptoms:  Alcohol Use Disorder Identification Test Final Score (AUDIT): 0 The "Alcohol Use Disorders Identification Test", Guidelines for Use in Primary Care, Second Edition.  World Science writer [facility]). Score between 0-7:  no  or low risk or alcohol related problems. Score between 8-15:  moderate risk of alcohol related problems. Score between 16-19:  high risk of alcohol related problems. Score 20 or above:  warrants further diagnostic evaluation for alcohol dependence and treatment.   CLINICAL FACTORS:   Bipolar Disorder:   Mixed State Currently Psychotic   Musculoskeletal: Strength & Muscle Tone: within normal limits Gait & Station: normal Patient leans: N/A  Psychiatric Specialty Exam: Physical Exam  Nursing note and vitals reviewed. Constitutional: She is oriented to person, place, and time. She appears well-developed and well-nourished.  HENT:  Head: Normocephalic and atraumatic.  Respiratory: Effort normal.  Neurological: She is alert and oriented to person, place, and time.    Review of Systems  Blood pressure 124/71, pulse (!) 128, temperature 99.5 F (37.5 C), temperature source Oral, resp. rate 20, height 5\' 2"  (1.575 m), weight 93 kg, SpO2 91 %.Body mass index is 37.49 kg/m.  General Appearance: Disheveled  Eye Contact:  Good  Speech:  Normal Rate  Volume:  Decreased  Mood:  Anxious and Dysphoric  Affect:  Labile  Thought Process:  Goal Directed and Descriptions of Associations: Loose  Orientation:  Full (Time, Place, and Person)  Thought Content:  Delusions and Paranoid Ideation  Suicidal Thoughts:  No  Homicidal Thoughts:  No  Memory:  Immediate;   Poor Recent;   Poor Remote;   Poor  Judgement:  Impaired  Insight:  Lacking  Psychomotor Activity:  Increased  Concentration:  Concentration: Fair and Attention Span: Fair  Recall:  of Knowledge:  Fair  Language:  Good  Akathisia:  Negative  Handed:  Right  AIMS (if indicated):  Assets:  Desire for Improvement Resilience  ADL's:  Impaired  Cognition:  WNL  Sleep:  Number of Hours: 1.75      COGNITIVE FEATURES THAT CONTRIBUTE TO RISK:  Polarized thinking    SUICIDE RISK:   Moderate:  Frequent suicidal  ideation with limited intensity, and duration, some specificity in terms of plans, no associated intent, good self-control, limited dysphoria/symptomatology, some risk factors present, and identifiable protective factors, including available and accessible social support.  PLAN OF CARE: Patient is seen and examined.  Patient is a 29 year old female with the above-stated past psychiatric history who was admitted secondary to paranoia and worsening bipolar symptoms.  She will be admitted to the hospital.  She will be integrated in the milieu.  She will be encouraged to attend groups.  She will be restarted on her Risperdal, but we may need to increase the dosage and as well add a mood stabilizer depending on how she responds.  The only laboratories that we have available right now are a TSH that is 0.711 and a CBC that shows a mild anemia with a hemoglobin of 11 and hematocrit of 33.8.  Her platelets are 345,000.  She is tachycardic with a rate of 128.  Her blood pressure stable and she has a low-grade temperature of 99.5.  Nursing notes reflect she only slept 1.75 hours last night.  Hopefully we can stabilize the situation.  I certify that inpatient services furnished can reasonably be expected to improve the patient's condition.   Sharma Covert, MD 04/18/2020, 8:14 AM

## 2020-04-19 MED ORDER — RISPERIDONE 2 MG PO TBDP
2.0000 mg | ORAL_TABLET | Freq: Every day | ORAL | Status: DC
  Administered 2020-04-19 – 2020-04-21 (×3): 2 mg via ORAL
  Filled 2020-04-19 (×4): qty 1

## 2020-04-19 MED ORDER — LORAZEPAM 1 MG PO TABS
1.0000 mg | ORAL_TABLET | Freq: Four times a day (QID) | ORAL | Status: DC | PRN
  Administered 2020-04-20 – 2020-05-02 (×8): 1 mg via ORAL
  Filled 2020-04-19 (×8): qty 1

## 2020-04-19 MED ORDER — RISPERIDONE 1 MG PO TBDP
1.0000 mg | ORAL_TABLET | Freq: Every day | ORAL | Status: DC
  Administered 2020-04-19 – 2020-04-24 (×6): 1 mg via ORAL
  Filled 2020-04-19 (×8): qty 1

## 2020-04-19 NOTE — Progress Notes (Signed)
SPIRITUALITY GROUP NOTE  Spirituality group facilitated by Natalie [name], MDiv, [facility].  Group Description:  Group focused on topic of hope.  Patients participated in facilitated discussion around topic, connecting with one another around experiences and definitions for hope.  Group members engaged with visual explorer photos, reflecting on what hope looks like for them today.  Group engaged in discussion around how their definitions of hope are present today in hospital.   Modalities: Psycho-social ed, Adlerian, Narrative, MI Patient Progress: Present through half of group.  Requested to be excused.  Was able to engage with topic when facilitator prompted.  Noted she needed to focus on rehab and recovery - but expressed concern that others outside of hospital would "wait for her"   She expressed that they always had been supportive in the past.   Following group, She spoke with chaplain 1:1 and described feeling concern for her spouse - stating she did not want to burden him with her care and wondered where she could find out if they were "still together"   Chaplain encouraged Natalie [name] in speaking with her family and engaging her own recover process at [facility].

## 2020-04-19 NOTE — Tx Team (Signed)
Interdisciplinary Treatment and Diagnostic Plan Update  04/19/2020 Time of Session: 10:00am Natalie [name] MRN: [id number]  Principal Diagnosis: <principal problem not specified>  Secondary Diagnoses: Active Problems:   Bipolar disorder, current episode manic severe with psychotic features ([place])   Current Medications:  Current Facility-Administered Medications  Medication Dose Route Frequency Provider Last Rate Last Admin  . benztropine (COGENTIN) tablet 0.5 mg  0.5 mg Oral Daily PRN Sharma Covert, MD      . hydrOXYzine (ATARAX/VISTARIL) tablet 25 mg  25 mg Oral TID PRN Rankin, Shuvon B, NP   25 mg at 04/17/20 2242  . risperiDONE (RISPERDAL M-TABS) disintegrating tablet 2 mg  2 mg Oral Q8H PRN Sharma Covert, MD   2 mg at 04/19/20 5697   And  . ziprasidone (GEODON) injection 20 mg  20 mg Intramuscular PRN Sharma Covert, MD      . risperiDONE (RISPERDAL) tablet 0.25 mg  0.25 mg Oral BID Rankin, Shuvon B, NP   0.25 mg at 04/19/20 0728  . traZODone (DESYREL) tablet 50 mg  50 mg Oral QHS Rankin, Shuvon B, NP   50 mg at 04/18/20 2059   PTA Medications: Medications Prior to Admission  Medication Sig Dispense Refill Last Dose  . acetaminophen (TYLENOL) 500 MG tablet Take 1 tablet (500 mg total) by mouth every 6 (six) hours as needed. 30 tablet 0 Past Month at Unknown time  . benztropine (COGENTIN) 0.5 MG tablet Take 1 tablet (0.5 mg total) by mouth daily. 30 tablet 0   . Prenatal w/o A Vit-Fe Fum-FA (PRENATAL VITAMIN W/FE, FA) 29-1 MG CHEW Chew 1 tablet by mouth daily. 360 tablet 0   . risperiDONE (RISPERDAL) 0.25 MG tablet Take 1 tablet (0.25 mg total) by mouth 2 (two) times daily. 60 tablet 0   . traZODone (DESYREL) 50 MG tablet Take 1 tablet (50 mg total) by mouth at bedtime. 30 tablet 0     Patient Stressors: Marital or family conflict Medication change or noncompliance  Patient Strengths: Technical sales engineer for treatment/growth  Treatment  Modalities: Medication Management, Group therapy, Case management,  1 to 1 session with clinician, Psychoeducation, Recreational therapy.   Physician Treatment Plan for Primary Diagnosis: <principal problem not specified> Long Term Goal(s): Improvement in symptoms so as ready for discharge Improvement in symptoms so as ready for discharge   Short Term Goals: Ability to identify changes in lifestyle to reduce recurrence of condition will improve Ability to verbalize feelings will improve Ability to demonstrate self-control will improve Ability to identify and develop effective coping behaviors will improve Ability to maintain clinical measurements within normal limits will improve Compliance with prescribed medications will improve Ability to identify changes in lifestyle to reduce recurrence of condition will improve Ability to verbalize feelings will improve Ability to demonstrate self-control will improve Ability to identify and develop effective coping behaviors will improve Ability to maintain clinical measurements within normal limits will improve Compliance with prescribed medications will improve  Medication Management: Evaluate patient's response, side effects, and tolerance of medication regimen.  Therapeutic Interventions: 1 to 1 sessions, Unit Group sessions and Medication administration.  Evaluation of Outcomes: Not Met  Physician Treatment Plan for Secondary Diagnosis: Active Problems:   Bipolar disorder, current episode manic severe with psychotic features ([place])  Long Term Goal(s): Improvement in symptoms so as ready for discharge Improvement in symptoms so as ready for discharge   Short Term Goals: Ability to identify changes in lifestyle to reduce recurrence of condition  will improve Ability to verbalize feelings will improve Ability to demonstrate self-control will improve Ability to identify and develop effective coping behaviors will improve Ability to maintain  clinical measurements within normal limits will improve Compliance with prescribed medications will improve Ability to identify changes in lifestyle to reduce recurrence of condition will improve Ability to verbalize feelings will improve Ability to demonstrate self-control will improve Ability to identify and develop effective coping behaviors will improve Ability to maintain clinical measurements within normal limits will improve Compliance with prescribed medications will improve     Medication Management: Evaluate patient's response, side effects, and tolerance of medication regimen.  Therapeutic Interventions: 1 to 1 sessions, Unit Group sessions and Medication administration.  Evaluation of Outcomes: Not Met   RN Treatment Plan for Primary Diagnosis: <principal problem not specified> Long Term Goal(s): Knowledge of disease and therapeutic regimen to maintain health will improve  Short Term Goals: Ability to participate in decision making will improve, Ability to verbalize feelings will improve, Ability to identify and develop effective coping behaviors will improve and Compliance with prescribed medications will improve  Medication Management: RN will administer medications as ordered by provider, will assess and evaluate patient's response and provide education to patient for prescribed medication. RN will report any adverse and/or side effects to prescribing provider.  Therapeutic Interventions: 1 on 1 counseling sessions, Psychoeducation, Medication administration, Evaluate responses to treatment, Monitor vital signs and CBGs as ordered, Perform/monitor CIWA, COWS, AIMS and Fall Risk screenings as ordered, Perform wound care treatments as ordered.  Evaluation of Outcomes: Not Met   LCSW Treatment Plan for Primary Diagnosis: <principal problem not specified> Long Term Goal(s): Safe transition to appropriate next level of care at discharge, Engage patient in therapeutic group  addressing interpersonal concerns.  Short Term Goals: Engage patient in aftercare planning with referrals and resources, Increase social support, Increase ability to appropriately verbalize feelings, Identify triggers associated with mental health/substance abuse issues and Increase skills for wellness and recovery  Therapeutic Interventions: Assess for all discharge needs, 1 to 1 time with Social worker, Explore available resources and support systems, Assess for adequacy in community support network, Educate family and significant other(s) on suicide prevention, Complete Psychosocial Assessment, Interpersonal group therapy.  Evaluation of Outcomes: Not Met   Progress in Treatment: Attending groups: No. Participating in groups: No. Taking medication as prescribed: Yes. Toleration medication: Yes. Family/Significant other contact made: No, will contact:  Pt declined consents Patient understands diagnosis: Yes. Discussing patient identified problems/goals with staff: Yes. Medical problems stabilized or resolved: No. Denies suicidal/homicidal ideation: Yes. Issues/concerns per patient self-inventory: No. Other: none  New problem(s) identified: No, Describe:  none.  New Short Term/Long Term Goal(s): medication management for mood stabilization; elimination of SI thoughts; development of comprehensive mental wellness/sobriety plan.  Patient Goals:  "Gain stability"   Discharge Plan or Barriers: CSW will assess for discharge planning and referral needs.  Reason for Continuation of Hospitalization: Delusions  Hallucinations Medication stabilization  Estimated Length of Stay: 3-5 days  Attendees: Patient: Natalie [name] 04/19/2020   Physician: Myles Lipps, MD 04/19/2020   Nursing: Neldon Newport, RN 04/19/2020   RN Care Manager: 04/19/2020   Social Worker: Stephanie Acre, [place] 04/19/2020   Recreational Therapist:  04/19/2020   Other:  04/19/2020   Other:  04/19/2020   Other: 04/19/2020      Scribe for Treatment Team: Vassie Moselle, [place] 04/19/2020 10:32 AM

## 2020-04-19 NOTE — Progress Notes (Signed)
Pt was asking for her clothes in the locker, pt clothes in the locker were inappropriate for the unit, pt was able to have her bra that was locked up on admission.

## 2020-04-19 NOTE — Plan of Care (Signed)
Progress note  D: pt found in the hallway pacing; compliant with medication administration. Pt gave insight into improvement with voicing the need for more medication and PRN medication. Pt states they seem less hostile, angry and preoccupied today. Pt seems less paranoid as well. Pt continues to have obsessions revolving around cleaning and changing their bed linens. Pt needs firm boundary setting and complies when given. Pt denies si/hi/ah/vh and verbally agrees to approach staff if these become apparent or before harming themself/others while at bhh.  A: Pt provided support and encouragement. Pt given medication per protocol and standing orders. Q45m safety checks implemented and continued.  R: Pt safe on the unit. Will continue to monitor.  Pt progressing in the following metrics  Problem: Education: Goal: Mental status will improve Outcome: Progressing   Problem: Activity: Goal: Interest or engagement in activities will improve Outcome: Progressing Goal: Sleeping patterns will improve Outcome: Progressing   Problem: Coping: Goal: Ability to verbalize frustrations and anger appropriately will improve Outcome: Progressing

## 2020-04-19 NOTE — Progress Notes (Signed)
Pt continues to be paranoid needing redirection at times    04/19/20 2200  Psych Admission Type (Psych Patients Only)  Admission Status Involuntary  Psychosocial Assessment  Patient Complaints Anxiety;Suspiciousness  Eye Contact Fair  Facial Expression Anxious;Pensive  Affect Anxious;Preoccupied  Primary school teacher;Attention-seeking;Intrusive  Motor Activity Pacing  Appearance/Hygiene In hospital gown  Behavior Characteristics Cooperative  Mood Anxious;Preoccupied  Thought Process  Coherency Concrete thinking  Content Blaming others;Compulsions;Obsessions;Preoccupation;Paranoia  Delusions Controlled;Paranoid  Perception WDL  Hallucination None reported or observed  Judgment Poor  Confusion Mild  Danger to Self  Current suicidal ideation? Denies  Danger to Others  Danger to Others None reported or observed

## 2020-04-19 NOTE — Progress Notes (Signed)
Recreation Therapy Notes  INPATIENT RECREATION THERAPY ASSESSMENT  Patient Details Name: Shateria Paternostro MRN: [id number] DOB: 06-Dec-1990 Today's Date: 04/19/2020       Information Obtained From: Patient  Able to Participate in Assessment/Interview: Yes  Patient Presentation: Alert  Reason for Admission (Per Patient): Other (Comments)(Find out reason for being here)  Patient Stressors: Family, Friends, Work, Other (Comment)(Financial; Living situation)  Coping Skills:   Film/video editor, Write, Arguments, Music, Exercise, Prayer, Avoidance  Leisure Interests (2+):  Games - Video games, Music - Singing, Music - Listen  Frequency of Recreation/Participation: Other (Comment)(Video Games- Seldom; Listen to music/Singing- Weekly)  Awareness of Community Resources:  Yes  Community Resources:  Other (Comment)(Stores)  Current Use: No  If no, Barriers?: Transportation  Expressed Interest in [employer] Information: No  County of Residence:  [place]  Patient Main Form of Transportation: Taxi  Patient Strengths:  "not really sure"  Patient Identified Areas of Improvement:  "rage/temper; depression"  Patient Goal for Hospitalization:  "work on taking meds everyday, life situation after this and what will make me better before I leave here"  Current SI (including self-harm):  No  Current HI:  No  Current AVH: No  Staff Intervention Plan: Group Attendance, Collaborate with Interdisciplinary Treatment Team  Consent to Intern Participation: N/A    Caroll Rancher, LRT/CTRS  Lillia Abed, Kian Gamarra A 04/19/2020, 1:16 PM

## 2020-04-19 NOTE — Progress Notes (Signed)
[facility] MD Progress Note  04/19/2020 12:54 PM Natalie [name]  MRN:  [id number] Subjective: Patient is a 29 year old female with a past psychiatric history significant for schizoaffective disorder; bipolar type versus bipolar disorder who presented to the behavioral health hospital under involuntary commitment on 04/17/2020 with worsening psychotic symptoms.  Objective: Patient is seen and examined.  Patient is a 29 year old female with the above-stated past psychiatric history who is seen in follow-up.  She is slightly better today, but still continues to be paranoid.  We discussed increasing her Risperdal today, and she is more than willing today to take an increased dosage.  She was seen in treatment team, and also is focused on "getting a divorce from my husband".  It is unclear at this point whether or not this is based on delusional thinking or things that had been going on at home.  She is quite tangential during the interview, and pressured.  Her blood pressure stable, but she continues to be tachycardic.  Her rate is between 120 and 124.  She is afebrile.  She slept 6 hours last night.  Unfortunately again the only laboratories we have on her CBC from 5/10.  Principal Problem: <principal problem not specified> Diagnosis: Active Problems:   Bipolar disorder, current episode manic severe with psychotic features (HCC)  Total Time spent with patient: 20 minutes  Past Psychiatric History: See admission H&P  Past Medical History:  Past Medical History:  Diagnosis Date  . Anxiety   . Chronic bipolar disorder (HCC)   . Depression   . H/O suicide attempt 05-2013   OD  . Obesity   . OCD (obsessive compulsive disorder)   . Psychiatric diagnosis    History reviewed. No pertinent surgical history. Family History:  Family History  Problem Relation Age of Onset  . Hypertension Mother   . Depression Father   . Hypertension Father   . Bipolar disorder Father   . Hypertension Maternal  Grandmother   . Obesity Maternal Grandmother   . Hypertension Paternal Grandmother    Family Psychiatric  History: See admission H&P Social History:  Social History   Substance and Sexual Activity  Alcohol Use No     Social History   Substance and Sexual Activity  Drug Use No    Social History   Socioeconomic History  . Marital status: Married    Spouse name: Not on file  . Number of children: Not on file  . Years of education: Not on file  . Highest education level: Not on file  Occupational History  . Not on file  Tobacco Use  . Smoking status: Never Smoker  . Smokeless tobacco: Never Used  Substance and Sexual Activity  . Alcohol use: No  . Drug use: No  . Sexual activity: Yes    Birth control/protection: I.U.D.  Other Topics Concern  . Not on file  Social History Narrative   Husband is [employer], dances, and sings for fun    Social Determinants of Health   Financial Resource Strain:   . Difficulty of Paying Living Expenses:   Food Insecurity:   . Worried About Programme researcher, broadcasting/film/video in the Last Year:   . Barista in the Last Year:   Transportation Needs:   . Freight forwarder (Medical):   Marland Kitchen Lack of Transportation (Non-Medical):   Physical Activity:   . Days of Exercise per Week:   . Minutes of Exercise per Session:  Stress:   . Feeling of Stress :   Social Connections:   . Frequency of Communication with Friends and Family:   . Frequency of Social Gatherings with Friends and Family:   . Attends Religious Services:   . Active Member of Clubs or Organizations:   . Attends Archivist Meetings:   Marland Kitchen Marital Status:    Additional Social History:    Pain Medications: see MAR Prescriptions: see MAR Over the Counter: see MAR History of alcohol / drug use?: No history of alcohol / drug abuse                    Sleep: Fair  Appetite:  Fair  Current Medications: Current Facility-Administered  Medications  Medication Dose Route Frequency Provider Last Rate Last Admin  . benztropine (COGENTIN) tablet 0.5 mg  0.5 mg Oral Daily PRN Sharma Covert, MD      . hydrOXYzine (ATARAX/VISTARIL) tablet 25 mg  25 mg Oral TID PRN Rankin, Shuvon B, NP   25 mg at 04/17/20 2242  . risperiDONE (RISPERDAL M-TABS) disintegrating tablet 2 mg  2 mg Oral Q8H PRN Sharma Covert, MD   2 mg at 04/19/20 4401   And  . ziprasidone (GEODON) injection 20 mg  20 mg Intramuscular PRN Sharma Covert, MD      . risperiDONE (RISPERDAL) tablet 0.25 mg  0.25 mg Oral BID Rankin, Shuvon B, NP   0.25 mg at 04/19/20 0728  . traZODone (DESYREL) tablet 50 mg  50 mg Oral QHS Rankin, Shuvon B, NP   50 mg at 04/18/20 2059    Lab Results:  Results for orders placed or performed during the hospital encounter of 04/17/20 (from the past 48 hour(s))  SARS Coronavirus 2 by RT PCR (hospital order, performed in [facility] hospital lab) Nasopharyngeal Nasopharyngeal Swab     Status: None   Collection Time: 04/17/20  6:53 PM   Specimen: Nasopharyngeal Swab  Result Value Ref Range   SARS Coronavirus 2 NEGATIVE NEGATIVE    Comment: (NOTE) SARS-CoV-2 target nucleic acids are NOT DETECTED. The SARS-CoV-2 RNA is generally detectable in upper and lower respiratory specimens during the acute phase of infection. The lowest concentration of SARS-CoV-2 viral copies this assay can detect is 250 copies / mL. A negative result does not preclude SARS-CoV-2 infection and should not be used as the sole basis for treatment or other patient management decisions.  A negative result may occur with improper specimen collection / handling, submission of specimen other than nasopharyngeal swab, presence of viral mutation(s) within the areas targeted by this assay, and inadequate number of viral copies (<250 copies / mL). A negative result must be combined with clinical observations, patient history, and epidemiological information. Fact  Sheet for Patients:   StrictlyIdeas.no Fact Sheet for Healthcare Providers: BankingDealers.co.za This test is not yet approved or cleared  by the [place] FDA and has been authorized for detection and/or diagnosis of SARS-CoV-2 by FDA under an Emergency Use Authorization (EUA).  This EUA will remain in effect (meaning this test can be used) for the duration of the COVID-19 declaration under Section 564(b)(1) of the Act, 21 U.S.C. section 360bbb-3(b)(1), unless the authorization is terminated or revoked sooner. Performed at [facility], [place] [address] [place], [place] [address]     Blood Alcohol level:  Lab Results  Component Value Date   [facility] <11 10/20/2014   ETH <11 36/64/4034    Metabolic Disorder Labs:  Lab Results  Component Value Date   HGBA1C 5.5 03/11/2020   MPG 100 09/03/2014   Lab Results  Component Value Date   PROLACTIN 34.1 (H) 03/11/2020   Lab Results  Component Value Date   CHOL 214 (H) 10/23/2019   TRIG 114 10/23/2019   HDL 44 10/23/2019   CHOLHDL 4.9 (H) 10/23/2019   VLDL 18 11/05/2014   LDLCALC 149 (H) 10/23/2019   LDLCALC 67 11/05/2014    Physical Findings: AIMS:  , ,  ,  ,    CIWA:  CIWA-Ar Total: 9 COWS:     Musculoskeletal: Strength & Muscle Tone: within normal limits Gait & Station: normal Patient leans: N/A  Psychiatric Specialty Exam: Physical Exam  Nursing note and vitals reviewed. Constitutional: She appears well-developed and well-nourished.  HENT:  Head: Normocephalic and atraumatic.  Respiratory: Effort normal.  Neurological: She is alert.    Review of Systems  Blood pressure 128/79, pulse (!) 124, temperature 98.4 F (36.9 C), temperature source Oral, resp. rate 20, height 5\' 2"  (1.575 m), weight 93 kg, SpO2 95 %.Body mass index is 37.49 kg/m.  General Appearance: Disheveled  Eye Contact:  Fair  Speech:  Pressured  Volume:  Increased  Mood:   Anxious and Dysphoric  Affect:  Labile  Thought Process:  Goal Directed and Descriptions of Associations: Tangential  Orientation:  Full (Time, Place, and Person)  Thought Content:  Delusions, Paranoid Ideation and Tangential  Suicidal Thoughts:  No  Homicidal Thoughts:  No  Memory:  Immediate;   Fair Recent;   Fair Remote;   Fair  Judgement:  Impaired  Insight:  Lacking  Psychomotor Activity:  Increased  Concentration:  Concentration: Fair and Attention Span: Fair  Recall:  of Knowledge:  Fair  Language:  Good  Akathisia:  Negative  Handed:  Right  AIMS (if indicated):     Assets:  Desire for Improvement Resilience  ADL's:  Intact  Cognition:  WNL  Sleep:  Number of Hours: 6     Treatment Plan Summary: Daily contact with patient to assess and evaluate symptoms and progress in treatment, Medication management and Plan : Patient is seen and examined.  Patient is a 29 year old female with the above-stated past psychiatric history who is seen in follow-up.   Diagnosis: #1 bipolar disorder, most recently manic, severe with psychotic features versus schizoaffective disorder; bipolar type, tachycardia  Patient is seen in follow-up.  She is only slightly better from yesterday.  She is agreeable to increasing her Risperdal.  I am going to increase her Risperdal to 1 mg p.o. daily and 2 mg p.o. nightly.  If that makes her sedated we will cut back on her medications.  It does not appear that she has been on any mood stabilizers for quite a while.  We will see how she does with the Risperdal first, but we may need to add something else.  Because of her agitation I will write for lorazepam today for anxiety, and hopefully that will allow her to be a little bit less paranoid and guarded to take the medications.  Unfortunately we do not have any labs on her, and I will order for everything this afternoon.  1.  Increase Risperdal to 1 mg p.o. daily and 2 mg p.o. nightly for  psychosis. 2.  CBC with differential, liver function enzymes, complete metabolic panel, TSH and hemoglobin A1c this p.m. 3.  Reorder EKG. 4.  Add Cogentin 0.5 mg p.o. twice daily side effects  of Risperdal. 5.  Add lorazepam 0.5 mg p.o. every 6 hours as needed anxiety or agitation. 6.  Disposition planning-in progress.  Antonieta Pert, MD 04/19/2020, 12:54 PM

## 2020-04-20 LAB — PREGNANCY, URINE: Preg Test, Ur: NEGATIVE

## 2020-04-20 MED ORDER — TRAZODONE HCL 50 MG PO TABS
50.0000 mg | ORAL_TABLET | Freq: Every evening | ORAL | Status: DC | PRN
  Administered 2020-04-20 – 2020-04-22 (×3): 50 mg via ORAL
  Filled 2020-04-20 (×3): qty 1

## 2020-04-20 MED ORDER — BENZTROPINE MESYLATE 0.5 MG PO TABS
0.5000 mg | ORAL_TABLET | Freq: Two times a day (BID) | ORAL | Status: DC | PRN
  Filled 2020-04-20: qty 14

## 2020-04-20 NOTE — Progress Notes (Addendum)
[facility] MD Progress Note  04/20/2020 10:38 AM Natalie [name]  MRN:  [id number] Subjective: patient reports feeling anxious. Denies medication side effects at this time. Denies suicidal ideations.   Objective: I have reviewed chart notes and have met with patient. 29 y old female, past history of Bipolar Disorder , admitted under IVC for paranoid ideations, medication non compliance . Patient presented guarded/suspicious, and reported feeling her medications were being manipulated/switched . History of a prior psychiatric admission in 2015.  Today patient presents alert, attentive. Reports she is admitted for medication adjustment. Denies medication side effects at this time. ( Currently on Risperidone at 1 mgr QAM /2 mgrs QHS)  No psychomotor agitation or restlessness. She presents vaguely anxious, guarded, and states it is difficult for her to relax on unit because of other patients and general activity on unit . She denies hallucinations and does not appear internally preoccupied at this time. Denies suicidal ideations.   Principal Problem: Bipolar Disorder by history Diagnosis: Active Problems:   Bipolar disorder, current episode manic severe with psychotic features ([place])  Total Time spent with patient: 20 minutes  Past Psychiatric History: See admission H&P  Past Medical History:  Past Medical History:  Diagnosis Date  . Anxiety   . Chronic bipolar disorder ([place])   . Depression   . H/O suicide attempt 05-2013   OD  . Obesity   . OCD (obsessive compulsive disorder)   . Psychiatric diagnosis    History reviewed. No pertinent surgical history. Family History:  Family History  Problem Relation Age of Onset  . Hypertension Mother   . Depression Father   . Hypertension Father   . Bipolar disorder Father   . Hypertension Maternal Grandmother   . Obesity Maternal Grandmother   . Hypertension Paternal Grandmother    Family Psychiatric  History: See admission H&P Social  History:  Social History   Substance and Sexual Activity  Alcohol Use No     Social History   Substance and Sexual Activity  Drug Use No    Social History   Socioeconomic History  . Marital status: Married    Spouse name: Not on file  . Number of children: Not on file  . Years of education: Not on file  . Highest education level: Not on file  Occupational History  . Not on file  Tobacco Use  . Smoking status: Never Smoker  . Smokeless tobacco: Never Used  Substance and Sexual Activity  . Alcohol use: No  . Drug use: No  . Sexual activity: Yes    Birth control/protection: I.U.D.  Other Topics Concern  . Not on file  Social History Narrative   Husband is [employer], dances, and sings for fun    Social Determinants of Health   Financial Resource Strain:   . Difficulty of Paying Living Expenses:   Food Insecurity:   . Worried About Charity fundraiser in the Last Year:   . Arboriculturist in the Last Year:   Transportation Needs:   . Film/video editor (Medical):   Marland Kitchen Lack of Transportation (Non-Medical):   Physical Activity:   . Days of Exercise per Week:   . Minutes of Exercise per Session:   Stress:   . Feeling of Stress :   Social Connections:   . Frequency of Communication with Friends and Family:   . Frequency of Social Gatherings with Friends and Family:   . Attends Religious Services:   .  Active Member of Clubs or Organizations:   . Attends Archivist Meetings:   Marland Kitchen Marital Status:    Additional Social History:    Pain Medications: see MAR Prescriptions: see MAR Over the Counter: see MAR History of alcohol / drug use?: No history of alcohol / drug abuse  Sleep: improving   Appetite:  improving   Current Medications: Current Facility-Administered Medications  Medication Dose Route Frequency Provider Last Rate Last Admin  . benztropine (COGENTIN) tablet 0.5 mg  0.5 mg Oral Daily PRN Sharma Covert, MD       . hydrOXYzine (ATARAX/VISTARIL) tablet 25 mg  25 mg Oral TID PRN Rankin, Shuvon B, NP   25 mg at 04/19/20 2051  . LORazepam (ATIVAN) tablet 1 mg  1 mg Oral Q6H PRN Sharma Covert, MD      . risperiDONE (RISPERDAL M-TABS) disintegrating tablet 1 mg  1 mg Oral Daily Sharma Covert, MD   1 mg at 04/20/20 0749  . risperiDONE (RISPERDAL M-TABS) disintegrating tablet 2 mg  2 mg Oral Q8H PRN Sharma Covert, MD   2 mg at 04/19/20 7341   And  . ziprasidone (GEODON) injection 20 mg  20 mg Intramuscular PRN Sharma Covert, MD      . risperiDONE (RISPERDAL M-TABS) disintegrating tablet 2 mg  2 mg Oral QHS Sharma Covert, MD   2 mg at 04/19/20 2050  . traZODone (DESYREL) tablet 50 mg  50 mg Oral QHS Rankin, Shuvon B, NP   50 mg at 04/19/20 2050    Lab Results:  No results found for this or any previous visit (from the past 48 hour(s)).  Blood Alcohol level:  Lab Results  Component Value Date   [facility] <11 10/20/2014   ETH <11 93/79/0240    Metabolic Disorder Labs: Lab Results  Component Value Date   HGBA1C 5.5 03/11/2020   MPG 100 09/03/2014   Lab Results  Component Value Date   PROLACTIN 34.1 (H) 03/11/2020   Lab Results  Component Value Date   CHOL 214 (H) 10/23/2019   TRIG 114 10/23/2019   HDL 44 10/23/2019   CHOLHDL 4.9 (H) 10/23/2019   VLDL 18 11/05/2014   LDLCALC 149 (H) 10/23/2019   LDLCALC 67 11/05/2014    Physical Findings: AIMS:  , ,  ,  ,    CIWA:  CIWA-Ar Total: 9 COWS:     Musculoskeletal: Strength & Muscle Tone: within normal limits Gait & Station: normal Patient leans: N/A  Psychiatric Specialty Exam: Physical Exam  Nursing note and vitals reviewed. Constitutional: She appears well-developed and well-nourished.  HENT:  Head: Normocephalic and atraumatic.  Respiratory: Effort normal.  Neurological: She is alert.    Review of Systems  no chest pain, no shortness of breath, no vomiting  Blood pressure (!) 162/91, pulse 98, temperature 98.5  F (36.9 C), temperature source Oral, resp. rate 20, height _0  (1.575 m), weight 93 kg, SpO2 95 %.Body mass index is 37.49 kg/m.  General Appearance: Fairly Groomed  Eye Contact:  fair- improves during session  Speech:  Normal Rate  Volume:  Normal  Mood:  presents anxious, vaguely constricted   Affect:  Congruent  Thought Process:  Goal Directed and Descriptions of Associations: Circumstantial  Orientation:  Other:  presents fully alert, attentive   Thought Content:  denies hallucinations, no delusions expressed at this time, presents vaguely guarded  Suicidal Thoughts:  No denies suicidal or self injurious ideations   Homicidal  Thoughts:  No  Memory:  recent and remote grossly intact   Judgement:  Fair  Insight:  Fair  Psychomotor Activity:  Normal- no current psychomotor agitation or restlessness   Concentration:  Concentration: Fair and Attention Span: Fair  Recall:  [employer] of Knowledge:  Fair  Language:  Good  Akathisia:  Negative  Handed:  Right  AIMS (if indicated):     Assets:  Desire for Improvement Resilience  ADL's:  Intact  Cognition:  WNL  Sleep:  Number of Hours: 6   Assessment -  36 y old female, past history of Bipolar Disorder , admitted under IVC for paranoid ideations, medication non compliance . Patient presented guarded/suspicious, and reported feeling her medications were being manipulated/switched . History of a prior psychiatric admission in 2015.  Today she presents calm, without agitation. Remains anxious and vaguely paranoid/fearful. Does not endorse hallucinations and does not appear internally preoccupied at present.  Denies SI. Thus far tolerating Risperidone titration well . HgbA1C 5.5 ( 4/21)   Treatment Plan Summary: Treatment Plan reviewed as below today 5/22  Encourage group and milieu participation Treatment team working on disposition planning options Continue  Risperdal 1 mg p.o. daily and 2 mg p.o. nightly for psychosis. Continue  Cogentin 0.5  mg p.o. twice daily as needed for EPS prevention Continue Lorazepam 1 mg p.o. every 6 hours as needed anxiety or agitation. Continue Trazodone 50 mgrs QHS PRN for insomnia Check BMP, Lipid Panel , Pregnancy Test, EKG to monitor QTc    Jenne Campus, MD 04/20/2020, 10:38 AM   Patient ID: [name], female   DOB: [birth year], 29 y.o.   MRN: [id number]

## 2020-04-20 NOTE — BHH Counselor (Signed)
Clinical Social Work Note  At [employer] request, CSW provided clothing to patient from the clothing closet, including sweat pants, polo shirt, and panties.  When given these items, patient was educated on the fact that she can wear her gown while clothes are washed when needed.   Ambrose Mantle, LCSW 04/20/2020, 5:04 PM

## 2020-04-20 NOTE — Progress Notes (Signed)
   04/20/20 2055  Psych Admission Type (Psych Patients Only)  Admission Status Involuntary  Psychosocial Assessment  Patient Complaints Anxiety;Suspiciousness;Restlessness  Eye Contact Fair  Facial Expression Anxious;Pensive  Affect Anxious;Preoccupied  Primary school teacher;Attention-seeking;Intrusive  Motor Activity Pacing  Appearance/Hygiene In hospital gown  Behavior Characteristics Appropriate to situation;Pacing;Restless  Mood Anxious;Preoccupied;Pleasant  Aggressive Behavior  Effect No apparent injury  Thought Process  Coherency Concrete thinking  Content Blaming others;Compulsions;Obsessions;Preoccupation;Paranoia  Delusions Controlled;Paranoid  Perception WDL  Hallucination None reported or observed  Judgment Poor  Confusion Mild  Danger to Self  Current suicidal ideation? Denies  Danger to Others  Danger to Others None reported or observed

## 2020-04-20 NOTE — BHH Group Notes (Signed)
  [facility] LCSW Group Therapy Note  Date/Time:  04/20/2020 11:15AM-12:00PM  Type of Therapy and Topic:  Group Therapy:  Feelings About Hospitalization  Participation Level:  Active   Description of Group This process group involved patients discussing their feelings related to being hospitalized, as well as the benefits they see to being in the hospital.  These feelings and benefits were itemized.  The group then brainstormed specific ways in which they could seek those same benefits when they discharge and return home.  Therapeutic Goals 1. Patient will identify and describe positive and negative feelings related to hospitalization 2. Patient will verbalize benefits of hospitalization to themselves personally 3. Patients will brainstorm together ways they can obtain similar benefits in the outpatient setting, identify barriers to wellness and possible solutions  Summary of Patient Progress:  When asked to express her primary feelings about being hospitalized, the patient instead focused on family stress, stating that she tries "not to go into my family's lies."  She talked about her father having Bipolar disorder, and so she assumes she inherited it from him.  She did later state that dealing with her illness, whether in the hospital or in her regular life, is a "battlefield."  She said this is a struggle like none she has faced before, and she feels her own personality is changing on a daily basis.  While she talked frequently during group, it was sometimes on topic and sometimes not on topic.  She was given strokes for her participation.  Therapeutic Modalities Cognitive Behavioral Therapy Motivational Interviewing    Ambrose Mantle, LCSW 04/20/2020, 3:17 PM

## 2020-04-20 NOTE — Progress Notes (Signed)
DAR NOTE: Patient presents with anxious affect and depressed mood.  Denies suicidal thoughts, auditory and visual hallucinations.  Rates depression at 0, hopelessness at 4, and anxiety at 7.  Maintained on routine safety checks.  Medications given as prescribed.  Support and encouragement offered as needed.  Attended group and participated.  States goal for today is "work on recovery for my discharge."  Patient observed pacing the unit while preoccupied with discharge plan.  Patient is safe on and off the unit.

## 2020-04-21 LAB — LIPID PANEL
Cholesterol: 141 mg/dL (ref 0–200)
HDL: 42 mg/dL (ref >40)
LDL Cholesterol: 88 mg/dL (ref 0–99)
Total CHOL/HDL Ratio: 3.4 RATIO
Triglycerides: 54 mg/dL (ref <150)
VLDL: 11 mg/dL (ref 0–40)

## 2020-04-21 LAB — BASIC METABOLIC PANEL
Anion gap: 10 (ref 5–15)
BUN: <5 mg/dL — ABNORMAL LOW (ref 6–20)
CO2: 25 mmol/L (ref 22–32)
Calcium: 8.7 mg/dL — ABNORMAL LOW (ref 8.9–10.3)
Chloride: 104 mmol/L (ref 98–111)
Creatinine, Ser: 0.46 mg/dL (ref 0.44–1.00)
GFR calc Af Amer: >60 mL/min (ref >60)
GFR calc non Af Amer: >60 mL/min (ref >60)
Glucose, Bld: 95 mg/dL (ref 70–99)
Potassium: 3.2 mmol/L — ABNORMAL LOW (ref 3.5–5.1)
Sodium: 139 mmol/L (ref 135–145)

## 2020-04-21 MED ORDER — POTASSIUM CHLORIDE CRYS ER 20 MEQ PO TBCR
20.0000 meq | EXTENDED_RELEASE_TABLET | Freq: Two times a day (BID) | ORAL | Status: AC
  Administered 2020-04-21 – 2020-04-24 (×6): 20 meq via ORAL
  Filled 2020-04-21 (×9): qty 1

## 2020-04-21 MED ORDER — ACETAMINOPHEN 500 MG PO TABS
500.0000 mg | ORAL_TABLET | Freq: Four times a day (QID) | ORAL | Status: DC | PRN
  Filled 2020-04-21: qty 1

## 2020-04-21 NOTE — Progress Notes (Signed)
Adult Psychoeducational Group Note  Date:  04/21/2020 Time:  10:35 PM  Group Topic/Focus:  Wrap-Up Group:   The focus of this group is to help patients review their daily goal of treatment and discuss progress on daily workbooks.  Participation Level:  Did Not Attend  Participation Quality:  Did Not Attend  Affect:  Did Not Attend  Cognitive:  Did Not Attend  Insight: None  Engagement in Group:  Did Not Attend  Modes of Intervention:  Did Not Attend  Additional Comments:  Pt did not attend evening wrap up group.  Felipa Furnace 04/21/2020, 10:35 PM

## 2020-04-21 NOTE — Progress Notes (Signed)
   04/21/20 2130  Psych Admission Type (Psych Patients Only)  Admission Status Involuntary  Psychosocial Assessment  Patient Complaints Other (Comment)  Eye Contact Fair  Facial Expression Blank  Affect Anxious;Preoccupied  Speech Logical/coherent;Slow  Interaction Assertive;Childlike;Intrusive  Motor Activity Pacing;Restless;Slow  Appearance/Hygiene In hospital gown  Behavior Characteristics Appropriate to situation  Mood Preoccupied  Aggressive Behavior  Effect No apparent injury  Thought Process  Coherency Concrete thinking  Content Blaming others;Obsessions;Preoccupation  Delusions Controlled;Paranoid  Perception WDL  Hallucination None reported or observed  Judgment Limited  Confusion Mild  Danger to Self  Current suicidal ideation? Denies  Danger to Others  Danger to Others None reported or observed

## 2020-04-21 NOTE — Progress Notes (Signed)
   04/21/20 2130  COVID-19  Universal Masking-Does patient have mask on (ED/Ambulatory) / is patient wearing mask when staff enters room or during transport (Inpatient)? Yes  Copy of COVID-19 lab result in Chart? (if applicable) Yes - validated lab result is available in Results Review  Infection/Precautions Assessment - Complete every shift  (For questions, contact infection prevention at [phone].  On nights/weekends call [phone].)   See Sidebar for Inpatient Isolation Quick Reference Guide  Standard Precautions Assessment complete - Standard precautions

## 2020-04-21 NOTE — Progress Notes (Signed)
[facility] MD Progress Note  04/21/2020 2:07 PM Natalie [name]  MRN:  [id number]  Subjective: Natalie [name] reports, "My left leg hurts & I can't take Ibuprofen because it does not go with my regular medicines".   Objective: 29 y old female, past history of Bipolar Disorder , admitted under IVC for paranoid ideations, medication non-compliance. Patient presented guarded/suspicious and reported feeling her medications were being manipulated/switched. History of a prior psychiatric admission in 2015. Natalie [name] is seen, chart reviewed. The chart findings discussed with the treatment team. Today she presents alert, attentive. Reports she is admitted for medication adjustment. Denies medication side effects at this time. (Currently on Risperidone at 1 mgr QAM /2 mgrs QHS). No psychomotor agitation or restlessness. She presents vaguely anxious, guarded, and states it is difficult for her to relax on the unit because of other patients and general activity on unit. She complained of left leg pain & in agreement to take tylenol for pain. Says she can't take Ibuprofen as it is not compatible with her psych medications. She denies hallucinations and does not appear internally preoccupied at this time. Denies suicidal.homicidal ideations. She is visible on the unit, attending group sessions. Reviewed current lab reports.  Principal Problem: Bipolar Disorder.  Diagnosis: Active Problems:   Bipolar disorder, current episode manic severe with psychotic features (HCC)  Total Time spent with patient: 15 minutes  Past Psychiatric History: See admission H&P  Past Medical History:  Past Medical History:  Diagnosis Date  . Anxiety   . Chronic bipolar disorder (HCC)   . Depression   . H/O suicide attempt 05-2013   OD  . Obesity   . OCD (obsessive compulsive disorder)   . Psychiatric diagnosis    History reviewed. No pertinent surgical history.  Family History:  Family History  Problem Relation Age of Onset  .  Hypertension Mother   . Depression Father   . Hypertension Father   . Bipolar disorder Father   . Hypertension Maternal Grandmother   . Obesity Maternal Grandmother   . Hypertension Paternal Grandmother    Family Psychiatric  History: See admission H&P  Social History:  Social History   Substance and Sexual Activity  Alcohol Use No     Social History   Substance and Sexual Activity  Drug Use No    Social History   Socioeconomic History  . Marital status: Married    Spouse name: Not on file  . Number of children: Not on file  . Years of education: Not on file  . Highest education level: Not on file  Occupational History  . Not on file  Tobacco Use  . Smoking status: Never Smoker  . Smokeless tobacco: Never Used  Substance and Sexual Activity  . Alcohol use: No  . Drug use: No  . Sexual activity: Yes    Birth control/protection: I.U.D.  Other Topics Concern  . Not on file  Social History Narrative   Husband is [employer], dances, and sings for fun    Social Determinants of Health   Financial Resource Strain:   . Difficulty of Paying Living Expenses:   Food Insecurity:   . Worried About Programme researcher, broadcasting/film/video in the Last Year:   . Barista in the Last Year:   Transportation Needs:   . Freight forwarder (Medical):   Marland Kitchen Lack of Transportation (Non-Medical):   Physical Activity:   . Days of Exercise per Week:   . Minutes  of Exercise per Session:   Stress:   . Feeling of Stress :   Social Connections:   . Frequency of Communication with Friends and Family:   . Frequency of Social Gatherings with Friends and Family:   . Attends Religious Services:   . Active Member of Clubs or Organizations:   . Attends Banker Meetings:   Marland Kitchen Marital Status:    Additional Social History:    Pain Medications: see MAR Prescriptions: see MAR Over the Counter: see MAR History of alcohol / drug use?: No history of alcohol / drug  abuse  Sleep: Good  Appetite:  improving   Current Medications: Current Facility-Administered Medications  Medication Dose Route Frequency Provider Last Rate Last Admin  . acetaminophen (TYLENOL) tablet 500 mg  500 mg Oral Q6H PRN Armandina Stammer I, NP      . benztropine (COGENTIN) tablet 0.5 mg  0.5 mg Oral BID PRN Cobos, Rockey Situ, MD      . LORazepam (ATIVAN) tablet 1 mg  1 mg Oral Q6H PRN Antonieta Pert, MD   1 mg at 04/20/20 2053  . risperiDONE (RISPERDAL M-TABS) disintegrating tablet 1 mg  1 mg Oral Daily Antonieta Pert, MD   1 mg at 04/21/20 0730  . risperiDONE (RISPERDAL M-TABS) disintegrating tablet 2 mg  2 mg Oral Q8H PRN Antonieta Pert, MD   2 mg at 04/19/20 2633   And  . ziprasidone (GEODON) injection 20 mg  20 mg Intramuscular PRN Antonieta Pert, MD      . risperiDONE (RISPERDAL M-TABS) disintegrating tablet 2 mg  2 mg Oral QHS Antonieta Pert, MD   2 mg at 04/20/20 2054  . traZODone (DESYREL) tablet 50 mg  50 mg Oral QHS PRN Cobos, Rockey Situ, MD   50 mg at 04/20/20 2053   Lab Results:  Results for orders placed or performed during the hospital encounter of 04/17/20 (from the past 48 hour(s))  Pregnancy, urine     Status: None   Collection Time: 04/20/20  3:41 PM  Result Value Ref Range   Preg Test, Ur NEGATIVE NEGATIVE    Comment: Performed at [facility], [address], [place], [place] [address]  Basic metabolic panel     Status: Abnormal   Collection Time: 04/21/20  6:39 AM  Result Value Ref Range   Sodium 139 135 - 145 mmol/L   Potassium 3.2 (L) 3.5 - 5.1 mmol/L   Chloride 104 98 - 111 mmol/L   CO2 25 22 - 32 mmol/L   Glucose, Bld 95 70 - 99 mg/dL    Comment: Glucose reference range applies only to samples taken after fasting for at least 8 hours.   BUN <5 (L) 6 - 20 mg/dL   Creatinine, Ser 2.56 0.44 - 1.00 mg/dL   Calcium 8.7 (L) 8.9 - 10.3 mg/dL   GFR calc non Af Amer >60 >60 mL/min   GFR calc Af Amer >60 >60 mL/min    Anion gap 10 5 - 15    Comment: Performed at [facility], [address], [place], [place] [address]  Lipid panel     Status: None   Collection Time: 04/21/20  6:39 AM  Result Value Ref Range   Cholesterol 141 0 - 200 mg/dL   Triglycerides 54 <342 mg/dL   HDL 42 >87 mg/dL   Total CHOL/HDL Ratio 3.4 RATIO   VLDL 11 0 - 40 mg/dL   LDL Cholesterol  88 0 - 99 mg/dL    Comment:        Total Cholesterol/HDL:CHD Risk Coronary Heart Disease Risk Table                     Men   Women  1/2 Average Risk   3.4   3.3  Average Risk       5.0   4.4  2 X Average Risk   9.6   7.1  3 X Average Risk  23.4   11.0        Use the calculated Patient Ratio above and the CHD Risk Table to determine the patient's CHD Risk.        ATP III CLASSIFICATION (LDL):  <100     mg/dL   Optimal  100-129  mg/dL   Near or Above                    Optimal  130-159  mg/dL   Borderline  160-189  mg/dL   High  >190     mg/dL   Very High Performed at [facility] [address] [place], [place] [address]    Blood Alcohol level:  Lab Results  Component Value Date   [facility] <11 10/20/2014   ETH <11 96/78/9381   Metabolic Disorder Labs: Lab Results  Component Value Date   HGBA1C 5.5 03/11/2020   MPG 100 09/03/2014   Lab Results  Component Value Date   PROLACTIN 34.1 (H) 03/11/2020   Lab Results  Component Value Date   CHOL 141 04/21/2020   TRIG 54 04/21/2020   HDL 42 04/21/2020   CHOLHDL 3.4 04/21/2020   VLDL 11 04/21/2020   LDLCALC 88 04/21/2020   LDLCALC 149 (H) 10/23/2019    Physical Findings: AIMS:  , ,  ,  ,    CIWA:  CIWA-Ar Total: 9 COWS:     Musculoskeletal: Strength & Muscle Tone: within normal limits Gait & Station: normal Patient leans: N/A  Psychiatric Specialty Exam: Physical Exam  Nursing note and vitals reviewed. Constitutional: She appears well-developed and well-nourished.  HENT:  Head: Normocephalic and atraumatic.  Cardiovascular:  Normal rate.  Respiratory: Effort normal.  Genitourinary:    Genitourinary Comments: Deferred   Musculoskeletal:        General: Normal range of motion.     Cervical back: Normal range of motion.  Neurological: She is alert.  Skin: Skin is warm.    Review of Systems  Constitutional: Negative for chills, diaphoresis and fever.  HENT: Negative for congestion, rhinorrhea, sneezing and sore throat.   Eyes: Negative for discharge.  Respiratory: Negative for cough, chest tightness, shortness of breath and wheezing.   Cardiovascular: Negative for chest pain and palpitations.  Gastrointestinal: Negative for diarrhea, nausea and vomiting.  Endocrine: Negative for cold intolerance.  Genitourinary: Negative for difficulty urinating.  Musculoskeletal: Positive for arthralgias and myalgias.  Allergic/Immunologic: Negative for environmental allergies and food allergies.       Allergies: NKDA  Neurological: Negative for dizziness, tremors, seizures, syncope and headaches.  Psychiatric/Behavioral: Positive for dysphoric mood. Negative for agitation, behavioral problems, confusion, decreased concentration, hallucinations, self-injury, sleep disturbance and suicidal ideas. The patient is not nervous/anxious and is not hyperactive.     no chest pain, no shortness of breath, no vomiting  Blood pressure 131/82, pulse (!) 115, temperature 99 F (37.2 C), temperature source Oral, resp. rate 20, height 5\' 2"  (1.575 m), weight 93 kg, SpO2 95 %.  Body mass index is 37.49 kg/m.  General Appearance: Fairly Groomed  Eye Contact:  fair- improves during session  Speech:  Normal Rate  Volume:  Normal  Mood:  presents anxious, vaguely constricted   Affect:  Congruent  Thought Process:  Goal Directed and Descriptions of Associations: Circumstantial  Orientation:  Other:  presents fully alert, attentive   Thought Content:  denies hallucinations, no delusions expressed at this time, presents vaguely guarded  Suicidal  Thoughts:  No denies suicidal or self injurious ideations   Homicidal Thoughts:  No  Memory:  recent and remote grossly intact   Judgement:  Fair  Insight:  Fair  Psychomotor Activity:  Normal- no current psychomotor agitation or restlessness   Concentration:  Concentration: Fair and Attention Span: Fair  Recall:  [employer] of Knowledge:  Fair  Language:  Good  Akathisia:  Negative  Handed:  Right  AIMS (if indicated):     Assets:  Desire for Improvement Resilience  ADL's:  Intact  Cognition:  WNL  Sleep:  Number of Hours: 2.25   Assessment -  35 y old female, past history of Bipolar Disorder , admitted under IVC for paranoid ideations, medication non compliance . Patient presented guarded/suspicious, and reported feeling her medications were being manipulated/switched . History of a prior psychiatric admission in 2015.  Today she presents calm, without agitation. Remains anxious and vaguely paranoid/fearful. Does not endorse hallucinations and does not appear internally preoccupied at present.  Denies SI. Thus far tolerating Risperidone titration well . HgbA1C 5.5 ( 4/21)   Treatment Plan Summary: -Continue inpatient hospitalization. -Will continue today 04/21/2020 plan as below except where it is noted.  Encourage group and milieu participation Treatment team working on disposition planning options.  Continue  Risperdal 1 mg p.o. daily. Continue Risperdal 2 mg p.o. nightly for psychosis. Continue Cogentin 0.5  mg p.o. twice daily as needed for EPS prevention Continue Lorazepam 1 mg p.o. every 6 hours as needed anxiety or agitation. Continue Trazodone 50 mgrs QHS PRN for insomnia Reviewed results for  BMP, low potassium of 3.2. Reviewed Lipid Panel, result is unremarkable.  Reviewed Pregnancy Test result of 04-20-20 - negative. EKG to monitor QTc, result -pending. Start Kdur 20 meq po bid x 3 days.Armandina Stammer, NP, PMHNP, FNP-BC 04/21/2020, 2:07 PM  Patient ID:  Natalie [name], female   DOB: 08/18/1991, 29 y.o.   MRN: [id number] Patient ID: Natalie [name], female   DOB: May 21, 1991, 29 y.o.   MRN: [id number]

## 2020-04-21 NOTE — [facility] Group Notes (Signed)
[facility] LCSW Group Therapy Note  Date/Time:  04/21/2020  11:00AM-12:00PM  Type of Therapy and Topic:  Group Therapy:  Music and Mood  Participation Level:  Active   Description of Group: In this process group, members listened to a variety of genres of music and identified that different types of music evoke different responses.  Patients were encouraged to identify music that was soothing for them and music that was energizing for them.  Patients discussed how this knowledge can help with wellness and recovery in various ways including managing depression and anxiety as well as encouraging healthy sleep habits.    Therapeutic Goals: Patients will explore the impact of different varieties of music on mood Patients will verbalize the thoughts they have when listening to different types of music Patients will identify music that is soothing to them as well as music that is energizing to them Patients will discuss how to use this knowledge to assist in maintaining wellness and recovery Patients will explore the use of music as a coping skill  Summary of Patient Progress:  At the beginning of group, patient expressed that she felt her medications are helping her.  She listened attentively.  As the group had about 10 minutes ago, she stated she wanted to be excused.    Therapeutic Modalities: Solution Focused Brief Therapy Activity   Ambrose Mantle, LCSW

## 2020-04-21 NOTE — Plan of Care (Signed)
Progress note  D: pt found in bed; compliant with medication administration. Pt continues to be hypervigilant regarding their medications and the doses they are on. Pt states they feel their medications are too strong at night. When confronted with their sleep habits and hours, pt states they don't need more than 4 hours a night and the meds are keeping them tired. Pt was educated with no evidence of learning. Pt continues to voice pain with their feet. Pt was educated on the need for rest and to limit the pacing throughout the out. Pt denied the need for this. Pt denies si/hi/ah/vh and verbally agrees to approach staff if these become apparent or before harming themself/others while at [facility].  A: Pt provided support and encouragement. Pt given medication per protocol and standing orders. Q41m safety checks implemented and continued.  R: Pt safe on the unit. Will continue to monitor.  Pt progressing in the following metrics  Problem: Health Behavior/Discharge Planning: Goal: Identification of resources available to assist in meeting health care needs will improve Outcome: Progressing Goal: Compliance with treatment plan for underlying cause of condition will improve Outcome: Progressing   Problem: Physical Regulation: Goal: Ability to maintain clinical measurements within normal limits will improve Outcome: Progressing   Problem: Safety: Goal: Periods of time without injury will increase Outcome: Progressing

## 2020-04-22 MED ORDER — RISPERIDONE 1 MG PO TBDP
3.0000 mg | ORAL_TABLET | Freq: Every day | ORAL | Status: DC
  Administered 2020-04-22 – 2020-04-23 (×2): 3 mg via ORAL
  Filled 2020-04-22 (×3): qty 3

## 2020-04-22 NOTE — Progress Notes (Signed)
[facility] MD Progress Note  04/22/2020 2:06 PM Natalie [name]  MRN:  [id number] Subjective:  Patient is a 29 year old female with a past psychiatric history significant for schizoaffective disorder; bipolar type versus bipolar disorder who presented to the behavioral health hospital under involuntary commitment on 04/17/2020 with worsening psychotic symptoms.  Objective: Patient is seen and examined.  Patient is a 29 year old female with the above-stated past psychiatric history who is seen in follow-up.  She is more verbal today, but still paranoid.  He denied auditory or visual hallucinations, but is very suspicious about any medication changes.  She is emphatic about "the doctor is the only one who makes these decisions".  She also is concerned that someone else might get access to the medicines and alter them.  I still believe that she has schizoaffective disorder; bipolar type versus bipolar disorder.  Her paranoia is just beyond where bipolar disorder with psychotic features would be.  She is so guarded and paranoid she was standing outside of the doorway listening to see if she could hear anything about herself.  Initially her blood pressure was stable at 133/80, repeat was 141/83.  She is tachycardic.  Rate was 118, and repeat was 131.  She is afebrile.  She slept 5.5 hours last night.  Review of her laboratories showed essentially normal electrolytes except for slightly low potassium on 5/23.  It was 3.2.  Her CBC from 5/10 showed an anemia of a hemoglobin of 11 and a hematocrit of 33.8.  Her platelets were 345,000.  Her MCV was low normal at 85.  C test on 5/22 was negative.  Her TSH from 5/10 was 0.711.  Principal Problem: <principal problem not specified> Diagnosis: Active Problems:   Bipolar disorder, current episode manic severe with psychotic features ([place])  Total Time spent with patient: 20 minutes  Past Psychiatric History: See admission H&P  Past Medical History:  Past Medical History:   Diagnosis Date  . Anxiety   . Chronic bipolar disorder ([place])   . Depression   . H/O suicide attempt 05-2013   OD  . Obesity   . OCD (obsessive compulsive disorder)   . Psychiatric diagnosis    History reviewed. No pertinent surgical history. Family History:  Family History  Problem Relation Age of Onset  . Hypertension Mother   . Depression Father   . Hypertension Father   . Bipolar disorder Father   . Hypertension Maternal Grandmother   . Obesity Maternal Grandmother   . Hypertension Paternal Grandmother    Family Psychiatric  History: See admission H&P Social History:  Social History   Substance and Sexual Activity  Alcohol Use No     Social History   Substance and Sexual Activity  Drug Use No    Social History   Socioeconomic History  . Marital status: Married    Spouse name: Not on file  . Number of children: Not on file  . Years of education: Not on file  . Highest education level: Not on file  Occupational History  . Not on file  Tobacco Use  . Smoking status: Never Smoker  . Smokeless tobacco: Never Used  Substance and Sexual Activity  . Alcohol use: No  . Drug use: No  . Sexual activity: Yes    Birth control/protection: I.U.D.  Other Topics Concern  . Not on file  Social History Narrative   Husband is [employer], dances, and sings for fun    Social Determinants  of Health   Financial Resource Strain:   . Difficulty of Paying Living Expenses:   Food Insecurity:   . Worried About Programme researcher, broadcasting/film/video in the Last Year:   . Barista in the Last Year:   Transportation Needs:   . Freight forwarder (Medical):   Marland Kitchen Lack of Transportation (Non-Medical):   Physical Activity:   . Days of Exercise per Week:   . Minutes of Exercise per Session:   Stress:   . Feeling of Stress :   Social Connections:   . Frequency of Communication with Friends and Family:   . Frequency of Social Gatherings with Friends and Family:    . Attends Religious Services:   . Active Member of Clubs or Organizations:   . Attends Banker Meetings:   Marland Kitchen Marital Status:    Additional Social History:    Pain Medications: see MAR Prescriptions: see MAR Over the Counter: see MAR History of alcohol / drug use?: No history of alcohol / drug abuse                    Sleep: Fair  Appetite:  Fair  Current Medications: Current Facility-Administered Medications  Medication Dose Route Frequency Provider Last Rate Last Admin  . acetaminophen (TYLENOL) tablet 500 mg  500 mg Oral Q6H PRN Armandina Stammer I, NP      . benztropine (COGENTIN) tablet 0.5 mg  0.5 mg Oral BID PRN Cobos, Rockey Situ, MD      . LORazepam (ATIVAN) tablet 1 mg  1 mg Oral Q6H PRN Antonieta Pert, MD   1 mg at 04/20/20 2053  . potassium chloride SA (KLOR-CON) CR tablet 20 mEq  20 mEq Oral BID Armandina Stammer I, NP   20 mEq at 04/22/20 0744  . risperiDONE (RISPERDAL M-TABS) disintegrating tablet 1 mg  1 mg Oral Daily Antonieta Pert, MD   1 mg at 04/22/20 0743  . risperiDONE (RISPERDAL M-TABS) disintegrating tablet 2 mg  2 mg Oral Q8H PRN Antonieta Pert, MD   2 mg at 04/19/20 9702   And  . ziprasidone (GEODON) injection 20 mg  20 mg Intramuscular PRN Antonieta Pert, MD      . risperiDONE (RISPERDAL M-TABS) disintegrating tablet 3 mg  3 mg Oral QHS Antonieta Pert, MD      . traZODone (DESYREL) tablet 50 mg  50 mg Oral QHS PRN Cobos, Rockey Situ, MD   50 mg at 04/21/20 2137    Lab Results:  Results for orders placed or performed during the hospital encounter of 04/17/20 (from the past 48 hour(s))  Pregnancy, urine     Status: None   Collection Time: 04/20/20  3:41 PM  Result Value Ref Range   Preg Test, Ur NEGATIVE NEGATIVE    Comment: Performed at [facility], [address] [place], [place] [address]  Basic metabolic panel     Status: Abnormal   Collection Time: 04/21/20  6:39 AM  Result Value Ref Range    Sodium 139 135 - 145 mmol/L   Potassium 3.2 (L) 3.5 - 5.1 mmol/L   Chloride 104 98 - 111 mmol/L   CO2 25 22 - 32 mmol/L   Glucose, Bld 95 70 - 99 mg/dL    Comment: Glucose reference range applies only to samples taken after fasting for at least 8 hours.   BUN <5 (L) 6 - 20 mg/dL   Creatinine, Ser 8.85  0.44 - 1.00 mg/dL   Calcium 8.7 (L) 8.9 - 10.3 mg/dL   GFR calc non Af Amer >60 >60 mL/min   GFR calc Af Amer >60 >60 mL/min   Anion gap 10 5 - 15    Comment: Performed at [facility], [address], [place], [place] [address]  Lipid panel     Status: None   Collection Time: 04/21/20  6:39 AM  Result Value Ref Range   Cholesterol 141 0 - 200 mg/dL   Triglycerides 54 <563 mg/dL   HDL 42 >87 mg/dL   Total CHOL/HDL Ratio 3.4 RATIO   VLDL 11 0 - 40 mg/dL   LDL Cholesterol 88 0 - 99 mg/dL    Comment:        Total Cholesterol/HDL:CHD Risk Coronary Heart Disease Risk Table                     Men   Women  1/2 Average Risk   3.4   3.3  Average Risk       5.0   4.4  2 X Average Risk   9.6   7.1  3 X Average Risk  23.4   11.0        Use the calculated Patient Ratio above and the CHD Risk Table to determine the patient's CHD Risk.        ATP III CLASSIFICATION (LDL):  <100     mg/dL   Optimal  564-332  mg/dL   Near or Above                    Optimal  130-159  mg/dL   Borderline  951-884  mg/dL   High  >166     mg/dL   Very High Performed at [facility], [address] [place], [place] [address]     Blood Alcohol level:  Lab Results  Component Value Date   [facility] <11 10/20/2014   ETH <11 10/09/2014    Metabolic Disorder Labs: Lab Results  Component Value Date   HGBA1C 5.5 03/11/2020   MPG 100 09/03/2014   Lab Results  Component Value Date   PROLACTIN 34.1 (H) 03/11/2020   Lab Results  Component Value Date   CHOL 141 04/21/2020   TRIG 54 04/21/2020   HDL 42 04/21/2020   CHOLHDL 3.4 04/21/2020   VLDL 11 04/21/2020   LDLCALC  88 04/21/2020   LDLCALC 149 (H) 10/23/2019    Physical Findings: AIMS:  , ,  ,  ,    CIWA:  CIWA-Ar Total: 9 COWS:     Musculoskeletal: Strength & Muscle Tone: within normal limits Gait & Station: normal Patient leans: N/A  Psychiatric Specialty Exam: Physical Exam  Nursing note and vitals reviewed. Constitutional: She is oriented to person, place, and time. She appears well-developed and well-nourished.  HENT:  Head: Normocephalic and atraumatic.  Respiratory: Effort normal.  Neurological: She is alert and oriented to person, place, and time.    Review of Systems  Blood pressure (!) 141/83, pulse (!) 131, temperature (!) 97.3 F (36.3 C), temperature source Oral, resp. rate 20, height 5\' 2"  (1.575 m), weight 93 kg, SpO2 95 %.Body mass index is 37.49 kg/m.  General Appearance: Disheveled  Eye Contact:  Good  Speech:  Pressured  Volume:  Increased  Mood:  Anxious, Dysphoric and Irritable  Affect:  Congruent  Thought Process:  Coherent and Descriptions of Associations: Tangential  Orientation:  Full (Time, Place, and Person)  Thought Content:  Delusions, Paranoid Ideation and Rumination  Suicidal Thoughts:  No  Homicidal Thoughts:  No  Memory:  Immediate;   Fair Recent;   Fair Remote;   Fair  Judgement:  Impaired  Insight:  Lacking  Psychomotor Activity:  Increased  Concentration:  Concentration: Fair and Attention Span: Fair  Recall:  [employer] of Knowledge:  Fair  Language:  Good  Akathisia:  Negative  Handed:  Right  AIMS (if indicated):     Assets:  Desire for Improvement Resilience  ADL's:  Intact  Cognition:  WNL  Sleep:  Number of Hours: 5.5     Treatment Plan Summary: Daily contact with patient to assess and evaluate symptoms and progress in treatment, Medication management and Plan : Patient is seen and examined.  Patient is a 29 year old female with the above-stated past psychiatric history who is seen in follow-up.   Diagnosis: #1  schizoaffective disorder; bipolar type versus bipolar disorder with psychotic features, #2 hypertension, #3 hypokalemia  Patient is seen in follow-up.  She is less isolated, but now more profoundly open with her paranoia.  She still suspicious of her husband as well is everyone else except for "the doctor".  We discussed the possibility of adding a mood stabilizer.  Review of her previous medications revealed that she had been treated with lithium previously, but she will not take a mood stabilizer.  She was on a minimal dose of Risperdal on admission (0.25 mg p.o. twice daily).  I suspect that she will be difficult to allow [place] to increase it much further, but I am going to increase her nighttime dose of Risperdal to 3 mg.  Hopefully she will remain compliant with that.  She did receive potassium chloride 20 mEq on 5/23 so hopefully the hypokalemia has been corrected.  Review of the electronic medical record revealed no treatment of hypertension in the past.  She has only had 1 or 2 elevated blood pressure readings so far, so I will hold off on treating it at least at this point.  No other changes to her medications.  1.  Continue Cogentin 0.5 mg p.o. twice daily as needed for tremors. 2.  Continue lorazepam 1 mg p.o. every 6 hours as needed anxiety. 3.  Increase Risperdal to 1 mg p.o. daily 3 mg p.o. nightly for psychosis and mood stability. 4.  Continue trazodone 50 mg p.o. nightly as needed insomnia. 5.  Disposition planning-in progress.  Antonieta Pert, MD 04/22/2020, 2:06 PM

## 2020-04-22 NOTE — Progress Notes (Signed)
Recreation Therapy Notes  Date: 5.24.21 Time: 1000 Location: 500 Hall Dayroom  Group Topic: Coping Skills  Goal Area(s) Addresses:  Patient will identify positive coping skills. Patient will identify benefit of using coping skills post d/c.  Behavioral Response: Engaged  Intervention: Worksheet, pencils  Activity: Coping A to Z.  Patients were to identify a positive coping skill for each letter of the alphabet.  Patients would then share their top 5 coping skills with the group.  Education: Pharmacologist, Building control surveyor.   Education Outcome: Acknowledges understanding/In group clarification offered/Needs additional education.   Clinical Observations/Feedback:  Pt described coping skills as a "life skill to help you get through situations".  Pt stated 2 of her coping skills were songs.  Those songs were "Suvivor" by Rolm Bookbinder and "Freedom" by Madolyn Frieze.  Pt explained her other coping skills as "disability- know yourself/disability better than anyone else"; "respect- respect people regardless of the situation, know where you are wrong" and "you-know you are different from anyone else/realize thing that are happening to you are not entirely your fault".  Pt was active and sang a few snippets of the songs she named in group.    Caroll Rancher, LRT/CTRS    Caroll Rancher A 04/22/2020 11:35 AM

## 2020-04-22 NOTE — Progress Notes (Signed)
Pt continues to be paranoid     04/22/20 2000  Psych Admission Type (Psych Patients Only)  Admission Status Involuntary  Psychosocial Assessment  Patient Complaints Anxiety  Eye Contact Fair  Facial Expression Blank  Affect Anxious;Preoccupied  Speech Logical/coherent;Slow  Interaction Assertive;Childlike;Intrusive  Motor Activity Pacing;Restless;Slow  Appearance/Hygiene In hospital gown  Behavior Characteristics Anxious  Mood Preoccupied  Aggressive Behavior  Effect No apparent injury  Thought Process  Coherency Concrete thinking  Content Blaming others;Obsessions;Preoccupation  Delusions Controlled;Paranoid  Perception WDL  Hallucination None reported or observed  Judgment Limited  Confusion Mild  Danger to Self  Current suicidal ideation? Denies  Danger to Others  Danger to Others None reported or observed

## 2020-04-22 NOTE — Progress Notes (Signed)
   04/22/20 1200  Psych Admission Type (Psych Patients Only)  Admission Status Involuntary  Psychosocial Assessment  Patient Complaints Anxiety  Eye Contact Fair  Facial Expression Blank  Affect Anxious;Preoccupied  Speech Logical/coherent;Slow  Interaction Assertive;Childlike;Intrusive  Motor Activity Pacing;Restless;Slow  Appearance/Hygiene In hospital gown  Behavior Characteristics Anxious  Mood Preoccupied  Aggressive Behavior  Effect No apparent injury  Thought Process  Coherency Concrete thinking  Content Blaming others;Obsessions;Preoccupation  Delusions Controlled;Paranoid  Perception WDL  Hallucination None reported or observed  Judgment Limited  Confusion Mild  Danger to Self  Current suicidal ideation? Denies  Danger to Others  Danger to Others None reported or observed

## 2020-04-22 NOTE — [facility] Group Notes (Signed)
LCSW Group Therapy Notes 04/22/2020 2:49 PM  Type of Therapy and Topic: Group Therapy: Overcoming Obstacles  Participation Level: Active  Description of Group:  In this group patients will be encouraged to explore what they see as obstacles to their own wellness and recovery. They will be guided to discuss their thoughts, feelings, and behaviors related to these obstacles. The group will process together ways to cope with barriers, with attention given to specific choices patients can make. Each patient will be challenged to identify changes they are motivated to make in order to overcome their obstacles. This group will be process-oriented, with patients participating in exploration of their own experiences as well as giving and receiving support and challenge from other group members.  Therapeutic Goals: 1. Patient will identify personal and current obstacles as they relate to admission. 2. Patient will identify barriers that currently interfere with their wellness or overcoming obstacles.  3. Patient will identify feelings, thought process and behaviors related to these barriers. 4. Patient will identify two changes they are willing to make to overcome these obstacles:   Summary of Patient Progress Anniya spoke but was guarded and vague. She mentioned that her relationship with her family and husband was something she struggled with prior to admission. She endorsed speaking with her husband by phone over the weekend. Santanna shares "now I know what I need to do," but declined to elaborate.  Therapeutic Modalities:  Cognitive Behavioral Therapy Solution Focused Therapy Motivational Interviewing Relapse Prevention Therapy  Enid Cutter, MSW, [facility] 04/22/2020 2:49 PM

## 2020-04-23 MED ORDER — TRAZODONE HCL 100 MG PO TABS
100.0000 mg | ORAL_TABLET | Freq: Every evening | ORAL | Status: DC | PRN
  Administered 2020-04-23 – 2020-04-27 (×5): 100 mg via ORAL
  Filled 2020-04-23 (×5): qty 1

## 2020-04-23 MED ORDER — B COMPLEX-C PO TABS
1.0000 | ORAL_TABLET | Freq: Every day | ORAL | Status: DC
  Administered 2020-04-23 – 2020-04-30 (×8): 1 via ORAL
  Filled 2020-04-23 (×10): qty 1

## 2020-04-23 NOTE — Progress Notes (Signed)
The patient shared with the group that she believes in keeping to herself. Her goal for tomorrow is to do things that will benefit her once she gets discharged, ie. Save money, go back to work.

## 2020-04-23 NOTE — Progress Notes (Signed)
   04/22/20 2000  Psych Admission Type (Psych Patients Only)  Admission Status Involuntary  Psychosocial Assessment  Patient Complaints Anxiety  Eye Contact Fair  Facial Expression Blank  Affect Anxious;Preoccupied  Speech Logical/coherent;Slow  Interaction Assertive;Childlike;Intrusive  Motor Activity Pacing;Restless;Slow  Appearance/Hygiene In hospital gown  Behavior Characteristics Anxious  Mood Preoccupied  Aggressive Behavior  Effect No apparent injury  Thought Process  Coherency Concrete thinking  Content Blaming others;Obsessions;Preoccupation  Delusions Controlled;Paranoid  Perception WDL  Hallucination None reported or observed  Judgment Limited  Confusion Mild  Danger to Self  Current suicidal ideation? Denies  Danger to Others  Danger to Others None reported or observed
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
## 2020-04-23 NOTE — Progress Notes (Signed)
Pt paranoid, pt asked if there was another place she could sleep because there was bugs in her room, but there were no bugs seen by staff. Pt given 1 mg Ativan per [facility]

## 2020-04-24 LAB — RETICULOCYTES
Immature Retic Fract: 12.3 % (ref 2.3–15.9)
RBC.: 3.49 MIL/uL — ABNORMAL LOW (ref 3.87–5.11)
Retic Count, Absolute: 31.1 10*3/uL (ref 19.0–186.0)
Retic Ct Pct: 0.9 % (ref 0.4–3.1)

## 2020-04-24 LAB — IRON AND TIBC
Iron: 20 ug/dL — ABNORMAL LOW (ref 28–170)
Saturation Ratios: 8 % — ABNORMAL LOW (ref 10.4–31.8)
TIBC: 262 ug/dL (ref 250–450)
UIBC: 242 ug/dL

## 2020-04-24 LAB — FOLATE: Folate: 19.8 ng/mL (ref >5.9)

## 2020-04-24 LAB — FERRITIN: Ferritin: 125 ng/mL (ref 11–307)

## 2020-04-24 LAB — VITAMIN B12: Vitamin B-12: 860 pg/mL (ref 180–914)

## 2020-04-24 MED ORDER — OXCARBAZEPINE 300 MG PO TABS
300.0000 mg | ORAL_TABLET | Freq: Every day | ORAL | Status: DC
  Administered 2020-04-24: 300 mg via ORAL
  Filled 2020-04-24 (×3): qty 1

## 2020-04-24 MED ORDER — OXCARBAZEPINE 150 MG PO TABS
150.0000 mg | ORAL_TABLET | Freq: Every day | ORAL | Status: DC
  Administered 2020-04-24 – 2020-04-25 (×2): 150 mg via ORAL
  Filled 2020-04-24 (×5): qty 1

## 2020-04-24 MED ORDER — RISPERIDONE 2 MG PO TBDP
4.0000 mg | ORAL_TABLET | Freq: Every day | ORAL | Status: DC
  Administered 2020-04-24 – 2020-04-25 (×2): 4 mg via ORAL
  Filled 2020-04-24 (×3): qty 2

## 2020-04-24 MED ORDER — RISPERIDONE 2 MG PO TBDP
2.0000 mg | ORAL_TABLET | Freq: Every day | ORAL | Status: DC
  Administered 2020-04-25 – 2020-04-26 (×2): 2 mg via ORAL
  Filled 2020-04-24: qty 2
  Filled 2020-04-24 (×3): qty 1

## 2020-04-24 NOTE — Progress Notes (Signed)
[facility] MD Progress Note  04/24/2020 12:46 PM Natalie [name]  MRN:  [id number] Subjective:  Patient is a 29 year old female with a past psychiatric history significant for schizoaffective disorder; bipolar type versus bipolar disorder who presented to the behavioral health hospital under involuntary commitment on 04/17/2020 with worsening psychotic symptoms.  Objective: Patient is seen and examined.  Patient is a 29 year old female with the above-stated past psychiatric history who is seen in follow-up.  She is about the same as I saw her on 04/22/2020.  She remains significantly paranoid, and still psychotic.  I discussed with her today the possibility of switching antipsychotics.  She said she would do this if she could change as an outpatient, and I told her I did not think that was a great idea.  We discussed the possibility of adding a mood stabilizer.  Review of the electronic medical record revealed that she had been previously treated with lithium in addition to the Risperdal.  She stated she did not want to take the lithium because "it had not worked before".  We discussed the possibility of Tegretol or Depakote, and she stated she did not want to take something that would cause her to have to have blood draws.  We discussed the possibility of Lamictal, but I just do not think we can escalate the dose fast enough.  She did agree on a trial of Trileptal.  She is still not sleeping well, so I told her that we would start 150 mg p.o. daily and give her a nighttime dose of 300 mg.  She is in agreement with that.  Additionally, I think we will have to increase her antipsychotic dosage.  We will increase her Risperdal to 4 mg p.o. nightly.  Her vital signs are stable, she is afebrile.  She slept 5.5 hours last night.  Review of her laboratories revealed a mildly low potassium on 5/23 of 3.2.  She has a mild anemia with a hemoglobin of 11 and hematocrit of 33.8.  The rest of the labs that we have are all  negative.  Principal Problem: <principal problem not specified> Diagnosis: Active Problems:   Bipolar disorder, current episode manic severe with psychotic features (HCC)  Total Time spent with patient: 20 minutes  Past Psychiatric History: See admission H&P  Past Medical History:  Past Medical History:  Diagnosis Date  . Anxiety   . Chronic bipolar disorder (HCC)   . Depression   . H/O suicide attempt 05-2013   OD  . Obesity   . OCD (obsessive compulsive disorder)   . Psychiatric diagnosis    History reviewed. No pertinent surgical history. Family History:  Family History  Problem Relation Age of Onset  . Hypertension Mother   . Depression Father   . Hypertension Father   . Bipolar disorder Father   . Hypertension Maternal Grandmother   . Obesity Maternal Grandmother   . Hypertension Paternal Grandmother    Family Psychiatric  History: See admission H&P Social History:  Social History   Substance and Sexual Activity  Alcohol Use No     Social History   Substance and Sexual Activity  Drug Use No    Social History   Socioeconomic History  . Marital status: Married    Spouse name: Not on file  . Number of children: Not on file  . Years of education: Not on file  . Highest education level: Not on file  Occupational History  . Not on file  Tobacco Use  .  Smoking status: Never Smoker  . Smokeless tobacco: Never Used  Substance and Sexual Activity  . Alcohol use: No  . Drug use: No  . Sexual activity: Yes    Birth control/protection: I.U.D.  Other Topics Concern  . Not on file  Social History Narrative   Husband is [employer], dances, and sings for fun    Social Determinants of Health   Financial Resource Strain:   . Difficulty of Paying Living Expenses:   Food Insecurity:   . Worried About Programme researcher, broadcasting/film/video in the Last Year:   . Barista in the Last Year:   Transportation Needs:   . Freight forwarder (Medical):    Marland Kitchen Lack of Transportation (Non-Medical):   Physical Activity:   . Days of Exercise per Week:   . Minutes of Exercise per Session:   Stress:   . Feeling of Stress :   Social Connections:   . Frequency of Communication with Friends and Family:   . Frequency of Social Gatherings with Friends and Family:   . Attends Religious Services:   . Active Member of Clubs or Organizations:   . Attends Banker Meetings:   Marland Kitchen Marital Status:    Additional Social History:    Pain Medications: see MAR Prescriptions: see MAR Over the Counter: see MAR History of alcohol / drug use?: No history of alcohol / drug abuse                    Sleep: Fair  Appetite:  Fair  Current Medications: Current Facility-Administered Medications  Medication Dose Route Frequency Provider Last Rate Last Admin  . acetaminophen (TYLENOL) tablet 500 mg  500 mg Oral Q6H PRN Armandina Stammer I, NP      . B-complex with vitamin C tablet 1 tablet  1 tablet Oral Daily Maryagnes Amos, FNP   1 tablet at 04/24/20 0757  . benztropine (COGENTIN) tablet 0.5 mg  0.5 mg Oral BID PRN Cobos, Rockey Situ, MD      . LORazepam (ATIVAN) tablet 1 mg  1 mg Oral Q6H PRN Antonieta Pert, MD   1 mg at 04/23/20 2056  . OXcarbazepine (TRILEPTAL) tablet 150 mg  150 mg Oral Daily Antonieta Pert, MD   150 mg at 04/24/20 1210  . Oxcarbazepine (TRILEPTAL) tablet 300 mg  300 mg Oral QHS Antonieta Pert, MD      . risperiDONE (RISPERDAL M-TABS) disintegrating tablet 2 mg  2 mg Oral Q8H PRN Antonieta Pert, MD   2 mg at 04/19/20 1497   And  . ziprasidone (GEODON) injection 20 mg  20 mg Intramuscular PRN Antonieta Pert, MD      . Melene Muller ON 04/25/2020] risperiDONE (RISPERDAL M-TABS) disintegrating tablet 2 mg  2 mg Oral Daily Antonieta Pert, MD      . risperiDONE (RISPERDAL M-TABS) disintegrating tablet 4 mg  4 mg Oral QHS Antonieta Pert, MD      . traZODone (DESYREL) tablet 100 mg  100 mg Oral QHS PRN  Maryagnes Amos, FNP   100 mg at 04/23/20 2056    Lab Results: No results found for this or any previous visit (from the past 48 hour(s)).  Blood Alcohol level:  Lab Results  Component Value Date   [facility] <11 10/20/2014   ETH <11 10/09/2014    Metabolic Disorder Labs: Lab Results  Component Value Date  HGBA1C 5.5 03/11/2020   MPG 100 09/03/2014   Lab Results  Component Value Date   PROLACTIN 34.1 (H) 03/11/2020   Lab Results  Component Value Date   CHOL 141 04/21/2020   TRIG 54 04/21/2020   HDL 42 04/21/2020   CHOLHDL 3.4 04/21/2020   VLDL 11 04/21/2020   LDLCALC 88 04/21/2020   LDLCALC 149 (H) 10/23/2019    Physical Findings: AIMS:  , ,  ,  ,    CIWA:  CIWA-Ar Total: 9 COWS:     Musculoskeletal: Strength & Muscle Tone: within normal limits Gait & Station: normal Patient leans: N/A  Psychiatric Specialty Exam: Physical Exam  Nursing note and vitals reviewed. Constitutional: She is oriented to person, place, and time. She appears well-developed and well-nourished.  HENT:  Head: Normocephalic and atraumatic.  Respiratory: Effort normal.  Neurological: She is alert and oriented to person, place, and time.    Review of Systems  Blood pressure 134/83, pulse 95, temperature 98 F (36.7 C), temperature source Oral, resp. rate 20, height 5\' 2"  (1.575 m), weight 93 kg, SpO2 95 %.Body mass index is 37.49 kg/m.  General Appearance: Disheveled  Eye Contact:  Fair  Speech:  Normal Rate  Volume:  Increased  Mood:  Anxious, Dysphoric and Irritable  Affect:  Constricted  Thought Process:  Goal Directed and Descriptions of Associations: Loose  Orientation:  Full (Time, Place, and Person)  Thought Content:  Delusions, Paranoid Ideation and Rumination  Suicidal Thoughts:  No  Homicidal Thoughts:  No  Memory:  Immediate;   Fair Recent;   Fair Remote;   Fair  Judgement:  Impaired  Insight:  Lacking  Psychomotor Activity:  Increased  Concentration:   Concentration: Fair and Attention Span: Fair  Recall:  [employer] of Knowledge:  Fair  Language:  Good  Akathisia:  Negative  Handed:  Right  AIMS (if indicated):     Assets:  Desire for Improvement Resilience  ADL's:  Intact  Cognition:  WNL  Sleep:  Number of Hours: 5.5     Treatment Plan Summary: Daily contact with patient to assess and evaluate symptoms and progress in treatment, Medication management and Plan : Patient is seen and examined.  Patient is a 30 year old female with the above-stated past psychiatric history who is seen in follow-up.   Diagnosis: #1 schizoaffective disorder; bipolar type, #2 anemia  Patient is seen in follow-up.  I would prefer to switch her antipsychotics, but because of her paranoia she will not allow me to do that right now.  I am going to increase her Risperdal to 2 mg p.o. daily and 4 mg p.o. nightly.  I am also going to start Trileptal 150 mg p.o. daily and 300 mg p.o. nightly for mood stability and sedation.  No other changes in medications, hopefully this will get her less paranoid and headed in the right direction.  With regard to labs I am going to go on and order an anemia panel for her to find out what the cause of her anemia is.  1.  Continue Cogentin 0.5 mg p.o. twice daily as needed tremor. 2.  Continue lorazepam 1 mg p.o. every 6 hours as needed anxiety. 3.  Add Trileptal 150 mg p.o. daily and 300 mg p.o. nightly for mood stability, anxiety and sleep. 4.  Increase Risperdal 2 mg p.o. daily 4 mg p.o. nightly for psychosis. 5.  Continue trazodone 100 mg p.o. nightly as needed insomnia. 6.  Order anemia panel. 7.  Disposition planning-in progress.  Antonieta Pert, MD 04/24/2020, 12:46 PM

## 2020-04-24 NOTE — Tx Team (Signed)
Interdisciplinary Treatment and Diagnostic Plan Update  04/24/2020 Time of Session: 10:00am Natalie [name] MRN: [id number]  Principal Diagnosis: <principal problem not specified>  Secondary Diagnoses: Active Problems:   Bipolar disorder, current episode manic severe with psychotic features ([place])   Current Medications:  Current Facility-Administered Medications  Medication Dose Route Frequency Provider Last Rate Last Admin  . acetaminophen (TYLENOL) tablet 500 mg  500 mg Oral Q6H PRN Lindell Spar I, NP      . B-complex with vitamin C tablet 1 tablet  1 tablet Oral Daily Suella Broad, FNP   1 tablet at 04/24/20 0757  . benztropine (COGENTIN) tablet 0.5 mg  0.5 mg Oral BID PRN Cobos, Myer Peer, MD      . LORazepam (ATIVAN) tablet 1 mg  1 mg Oral Q6H PRN Sharma Covert, MD   1 mg at 04/23/20 2056  . risperiDONE (RISPERDAL M-TABS) disintegrating tablet 2 mg  2 mg Oral Q8H PRN Sharma Covert, MD   2 mg at 04/19/20 3151   And  . ziprasidone (GEODON) injection 20 mg  20 mg Intramuscular PRN Sharma Covert, MD      . Derrill Memo ON 04/25/2020] risperiDONE (RISPERDAL M-TABS) disintegrating tablet 2 mg  2 mg Oral Daily Sharma Covert, MD      . risperiDONE (RISPERDAL M-TABS) disintegrating tablet 3 mg  3 mg Oral QHS Sharma Covert, MD   3 mg at 04/23/20 2056  . traZODone (DESYREL) tablet 100 mg  100 mg Oral QHS PRN Suella Broad, FNP   100 mg at 04/23/20 2056   PTA Medications: Medications Prior to Admission  Medication Sig Dispense Refill Last Dose  . acetaminophen (TYLENOL) 500 MG tablet Take 1 tablet (500 mg total) by mouth every 6 (six) hours as needed. 30 tablet 0 Past Month at Unknown time  . benztropine (COGENTIN) 0.5 MG tablet Take 1 tablet (0.5 mg total) by mouth daily. 30 tablet 0   . Prenatal w/o A Vit-Fe Fum-FA (PRENATAL VITAMIN W/FE, FA) 29-1 MG CHEW Chew 1 tablet by mouth daily. 360 tablet 0   . risperiDONE (RISPERDAL) 0.25 MG tablet Take 1 tablet  (0.25 mg total) by mouth 2 (two) times daily. 60 tablet 0   . traZODone (DESYREL) 50 MG tablet Take 1 tablet (50 mg total) by mouth at bedtime. 30 tablet 0     Patient Stressors: Marital or family conflict Medication change or noncompliance  Patient Strengths: Technical sales engineer for treatment/growth  Treatment Modalities: Medication Management, Group therapy, Case management,  1 to 1 session with clinician, Psychoeducation, Recreational therapy.   Physician Treatment Plan for Primary Diagnosis: <principal problem not specified> Long Term Goal(s): Improvement in symptoms so as ready for discharge Improvement in symptoms so as ready for discharge   Short Term Goals: Ability to identify changes in lifestyle to reduce recurrence of condition will improve Ability to verbalize feelings will improve Ability to demonstrate self-control will improve Ability to identify and develop effective coping behaviors will improve Ability to maintain clinical measurements within normal limits will improve Compliance with prescribed medications will improve Ability to identify changes in lifestyle to reduce recurrence of condition will improve Ability to verbalize feelings will improve Ability to demonstrate self-control will improve Ability to identify and develop effective coping behaviors will improve Ability to maintain clinical measurements within normal limits will improve Compliance with prescribed medications will improve  Medication Management: Evaluate patient's response, side effects, and tolerance of medication regimen.  Therapeutic  Interventions: 1 to 1 sessions, Unit Group sessions and Medication administration.  Evaluation of Outcomes: Not Met  Physician Treatment Plan for Secondary Diagnosis: Active Problems:   Bipolar disorder, current episode manic severe with psychotic features ([place])  Long Term Goal(s): Improvement in symptoms so as ready for  discharge Improvement in symptoms so as ready for discharge   Short Term Goals: Ability to identify changes in lifestyle to reduce recurrence of condition will improve Ability to verbalize feelings will improve Ability to demonstrate self-control will improve Ability to identify and develop effective coping behaviors will improve Ability to maintain clinical measurements within normal limits will improve Compliance with prescribed medications will improve Ability to identify changes in lifestyle to reduce recurrence of condition will improve Ability to verbalize feelings will improve Ability to demonstrate self-control will improve Ability to identify and develop effective coping behaviors will improve Ability to maintain clinical measurements within normal limits will improve Compliance with prescribed medications will improve     Medication Management: Evaluate patient's response, side effects, and tolerance of medication regimen.  Therapeutic Interventions: 1 to 1 sessions, Unit Group sessions and Medication administration.  Evaluation of Outcomes: Not Met   RN Treatment Plan for Primary Diagnosis: <principal problem not specified> Long Term Goal(s): Knowledge of disease and therapeutic regimen to maintain health will improve  Short Term Goals: Ability to participate in decision making will improve, Ability to verbalize feelings will improve, Ability to identify and develop effective coping behaviors will improve and Compliance with prescribed medications will improve  Medication Management: RN will administer medications as ordered by provider, will assess and evaluate patient's response and provide education to patient for prescribed medication. RN will report any adverse and/or side effects to prescribing provider.  Therapeutic Interventions: 1 on 1 counseling sessions, Psychoeducation, Medication administration, Evaluate responses to treatment, Monitor vital signs and CBGs as  ordered, Perform/monitor CIWA, COWS, AIMS and Fall Risk screenings as ordered, Perform wound care treatments as ordered.  Evaluation of Outcomes: Not Met   LCSW Treatment Plan for Primary Diagnosis: <principal problem not specified> Long Term Goal(s): Safe transition to appropriate next level of care at discharge, Engage patient in therapeutic group addressing interpersonal concerns.  Short Term Goals: Engage patient in aftercare planning with referrals and resources, Increase social support, Increase ability to appropriately verbalize feelings, Identify triggers associated with mental health/substance abuse issues and Increase skills for wellness and recovery  Therapeutic Interventions: Assess for all discharge needs, 1 to 1 time with Social worker, Explore available resources and support systems, Assess for adequacy in community support network, Educate family and significant other(s) on suicide prevention, Complete Psychosocial Assessment, Interpersonal group therapy.  Evaluation of Outcomes: Not Met  Progress in Treatment: Attending groups: Yes. Participating in groups: Yes. Taking medication as prescribed: Yes. Toleration medication: Yes. Family/Significant other contact made: No, will contact:  Pt declined consents Patient understands diagnosis: Yes. Discussing patient identified problems/goals with staff: Yes. Medical problems stabilized or resolved: No. Denies suicidal/homicidal ideation: Yes. Issues/concerns per patient self-inventory: No. Other: none  New problem(s) identified: No, Describe:  none.  New Short Term/Long Term Goal(s): medication management for mood stabilization; elimination of SI thoughts; development of comprehensive mental wellness/sobriety plan.  Patient Goals:  "Gain stability"   Discharge Plan or Barriers: Following up with her provider, Dr.Aktar on 06/01.   Reason for Continuation of Hospitalization: Anxiety Delusions  Medication  stabilization  Estimated Length of Stay: 3-5 days  Attendees: Patient: Natalie [name] 04/24/2020   Physician: Myles Lipps, MD  04/24/2020   Nursing: Neldon Newport, RN 04/24/2020   RN Care Manager: 04/24/2020   Social Worker: Stephanie Acre, [place] 04/24/2020   Recreational Therapist:  04/24/2020   Other:  04/24/2020   Other:  04/24/2020   Other: 04/24/2020     Scribe for Treatment Team: Joellen Jersey, [place] 04/24/2020 10:02 AM

## 2020-04-24 NOTE — [facility] Counselor (Signed)
CSW spoke to pt's husband RAYE WIENS [phone]) once consent was received. Patients husband reported that this pt would be able to return home to stay with him and he stated he had no safety concerns with her returning home. Mr. Waldorf stated that he is unable to drive, however he stated that he would be able to contact pt's mother to pick pt up at discharge.    Ruthann Cancer MSW, [employer] Clincal Social Worker  [facility]
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
## 2020-04-24 NOTE — Progress Notes (Signed)
Adult Psychoeducational Group Note  Date:  04/24/2020 Time:  9:29 PM  Group Topic/Focus:  Wrap-Up Group:   The focus of this group is to help patients review their daily goal of treatment and discuss progress on daily workbooks.  Participation Level:  Minimal  Participation Quality:  Appropriate  Affect:  Flat  Cognitive:  Oriented  Insight: Limited  Engagement in Group:  Engaged  Modes of Intervention:  Education and Support  Additional Comments:  Patient attended and participated in group tonight. She reports that It has been hard for her since she has been here. Today she spoke with her doctor. The doctor put on some new medication for sleeping,.  Lita Mains [facility] 04/24/2020, 9:29 PM

## 2020-04-24 NOTE — Progress Notes (Signed)
Recreation Therapy Notes  Date: 5.26.21 Time: 1115 Location: 500 Hall Dayroom  Group Topic:  Goal Setting  Goal Area(s) Addresses:  Patient will be able to identify four goals they wish to accomplish Patient will identify obstacles that would prevent reaching goals. Patient will identify what they need to achieve goals.  Behavioral Response:  Engaged  Intervention: Worksheet, pencils  Activity: Goal Planning.  Patients were to identify goals they want to accomplish in a week, month, year and five years.  Patients then identified obstacles they may face, what they will need to be successful and what they can start doing now to work towards goals.   Education:  Discharge Planning, Coping Skills, Goal Planning  Education Outcome: Acknowledges Education/In Group Clarification Provided/Needs Additional Education  Clinical Observations: Pt expressed wanting to "research or go on job interviews to get a job", maintain a job and regain food stamps in the next week.  In the next month, find a new place to live or relocate to [place], [place].  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  Pt identified obstacles as strength, belief and encouragement.  Pt stated she needs to stay focused and be persistent to achieve goals and start tomorrow by staying focused.    Caroll Rancher , LRT/CTRS    Caroll Rancher A 04/24/2020 12:49 PM

## 2020-04-24 NOTE — Progress Notes (Signed)
   04/24/20 0601  Vital Signs  Temp 98 F (36.7 C)  Temp Source Oral  Pulse Rate 93  Resp 20  BP 132/78  BP Location Right Arm  BP Method Automatic  Patient Position (if appropriate) Sitting  D:  Patient presents with an anxious and depressed affect. Patient took all of her medicine.  Patient denies SI/HI/ AVH.  Patient asked to talk with the social worker, and her husband and this Clinical research associate about a divorce from her husband. Patient asked about all the side effects of her medication. An explanation about her medication was givien. Patient wanted to talk to this Clinical research associate. Patient reported that other were talking about her and following her around.Patient reported that she was "on her cycle" and she needed privacy. Patient stated that she wanted to go and she didn't understand why others were being discharged and not her.  A:  Patient took scheduled medicine.  Support and encouragement provided Routine safety checks conducted every 15 minutes. Patient  Informed to notify staff with any concerns.   R:  Safety maintained.

## 2020-04-25 MED ORDER — FERROUS SULFATE 325 (65 FE) MG PO TABS
325.0000 mg | ORAL_TABLET | Freq: Two times a day (BID) | ORAL | Status: DC
  Administered 2020-04-25 – 2020-04-30 (×10): 325 mg via ORAL
  Filled 2020-04-25 (×14): qty 1

## 2020-04-25 MED ORDER — OXCARBAZEPINE 300 MG PO TABS
300.0000 mg | ORAL_TABLET | Freq: Every day | ORAL | Status: DC
  Administered 2020-04-26 – 2020-04-27 (×2): 300 mg via ORAL
  Filled 2020-04-25 (×4): qty 1

## 2020-04-25 MED ORDER — OXCARBAZEPINE 150 MG PO TABS
450.0000 mg | ORAL_TABLET | Freq: Every day | ORAL | Status: DC
  Administered 2020-04-25 – 2020-04-27 (×3): 450 mg via ORAL
  Filled 2020-04-25 (×4): qty 3
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
## 2020-04-25 NOTE — Progress Notes (Signed)
Recreation Therapy Notes  Date: 5.27.21 Time: 1000 Location: 500 Hall Dayroom  Group Topic: Anxiety  Goal Area(s) Addresses:  Patient will identify triggers to anxiety. Patient will identify physical symptoms when anxious. Patient will identify coping skills for anxiety.  Behavioral Response: Engaged  Intervention: Worksheet, pencils  Activity: Introduction to Anxiety.  Patients were to identify triggers to anxiety, physical symptoms, thoughts they have and coping skills for being anxious.  Education: Communication, Discharge Planning  Education Outcome: Acknowledges understanding/In group clarification offered/Needs additional education.   Clinical Observations/Feedback: Pt identified triggers to anxiety as anger, depression and misjudgment.  Pt expressed physical symptoms of shaking, rapid speech and heart rate/temperature changes.  Pt identified having thoughts of "please stay away from me at the moment", "man I really need to chill out" and "I am being judged and insulted too many times right now".  Pt stated coping skills were listening to music, taking the proper meds and knowing surroundings and people around me at all times.       Caroll Rancher, LRT/CTRS    Caroll Rancher A 04/25/2020 11:17 AM
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
## 2020-04-25 NOTE — Progress Notes (Signed)
The patient learned today that you can't assume that people are judging you unless you ask them. Her goal for tomorrow is to work on trying to adapt to different situations and to try to open up more.

## 2020-04-25 NOTE — Progress Notes (Signed)
[facility] MD Progress Note  04/25/2020 11:17 AM Natalie [name]  MRN:  [id number] Subjective:  Patient is a 29 year old female with a past psychiatric history significant for schizoaffective disorder; bipolar type versus bipolar disorder who presented to the behavioral health hospital under involuntary commitment on 04/17/2020 with worsening psychotic symptoms.  Objective: Patient is seen and examined.  Patient is a 29 year old female with the above-stated past psychiatric history who is seen in follow-up.  Unfortunately this morning she is decided not to take her medications.  We discussed that.  I again gave her the option of taking the Risperdal, or switching oral medications for psychosis.  The option I gave her was injectable Haldol today, and she has agreed to take the increased dosage of Risperdal.  She remains paranoid.  She needs constant redirection.  Her vital signs are stable, she is afebrile.  She was mildly tachycardic with a rate of 112 this morning.  She slept 5.75 h last night.  Her iron studies came back with normal reticulocyte count.  B12 was normal.  Her iron was low at 20, her TIBC was normal at 262, and her iron saturation was low at 8%.  U IBC was 242.  Principal Problem: <principal problem not specified> Diagnosis: Active Problems:   Bipolar disorder, current episode manic severe with psychotic features (HCC)  Total Time spent with patient: 20 minutes  Past Psychiatric History: See admission H&P  Past Medical History:  Past Medical History:  Diagnosis Date  . Anxiety   . Chronic bipolar disorder (HCC)   . Depression   . H/O suicide attempt 05-2013   OD  . Obesity   . OCD (obsessive compulsive disorder)   . Psychiatric diagnosis    History reviewed. No pertinent surgical history. Family History:  Family History  Problem Relation Age of Onset  . Hypertension Mother   . Depression Father   . Hypertension Father   . Bipolar disorder Father   . Hypertension Maternal  Grandmother   . Obesity Maternal Grandmother   . Hypertension Paternal Grandmother    Family Psychiatric  History: See admission H&P Social History:  Social History   Substance and Sexual Activity  Alcohol Use No     Social History   Substance and Sexual Activity  Drug Use No    Social History   Socioeconomic History  . Marital status: Married    Spouse name: Not on file  . Number of children: Not on file  . Years of education: Not on file  . Highest education level: Not on file  Occupational History  . Not on file  Tobacco Use  . Smoking status: Never Smoker  . Smokeless tobacco: Never Used  Substance and Sexual Activity  . Alcohol use: No  . Drug use: No  . Sexual activity: Yes    Birth control/protection: I.U.D.  Other Topics Concern  . Not on file  Social History Narrative   Husband is [employer], dances, and sings for fun    Social Determinants of Health   Financial Resource Strain:   . Difficulty of Paying Living Expenses:   Food Insecurity:   . Worried About Programme researcher, broadcasting/film/video in the Last Year:   . Barista in the Last Year:   Transportation Needs:   . Freight forwarder (Medical):   Marland Kitchen Lack of Transportation (Non-Medical):   Physical Activity:   . Days of Exercise per Week:   . Minutes  of Exercise per Session:   Stress:   . Feeling of Stress :   Social Connections:   . Frequency of Communication with Friends and Family:   . Frequency of Social Gatherings with Friends and Family:   . Attends Religious Services:   . Active Member of Clubs or Organizations:   . Attends Banker Meetings:   Marland Kitchen Marital Status:    Additional Social History:    Pain Medications: see MAR Prescriptions: see MAR Over the Counter: see MAR History of alcohol / drug use?: No history of alcohol / drug abuse                    Sleep: Fair  Appetite:  Good  Current Medications: Current Facility-Administered  Medications  Medication Dose Route Frequency Provider Last Rate Last Admin  . acetaminophen (TYLENOL) tablet 500 mg  500 mg Oral Q6H PRN Armandina Stammer I, NP      . B-complex with vitamin C tablet 1 tablet  1 tablet Oral Daily Maryagnes Amos, FNP   1 tablet at 04/25/20 9924  . benztropine (COGENTIN) tablet 0.5 mg  0.5 mg Oral BID PRN Cobos, Rockey Situ, MD      . LORazepam (ATIVAN) tablet 1 mg  1 mg Oral Q6H PRN Antonieta Pert, MD   1 mg at 04/23/20 2056  . OXcarbazepine (TRILEPTAL) tablet 150 mg  150 mg Oral Daily Antonieta Pert, MD   150 mg at 04/25/20 2683  . Oxcarbazepine (TRILEPTAL) tablet 300 mg  300 mg Oral QHS Antonieta Pert, MD   300 mg at 04/24/20 2101  . risperiDONE (RISPERDAL M-TABS) disintegrating tablet 2 mg  2 mg Oral Q8H PRN Antonieta Pert, MD   2 mg at 04/19/20 4196   And  . ziprasidone (GEODON) injection 20 mg  20 mg Intramuscular PRN Antonieta Pert, MD      . risperiDONE (RISPERDAL M-TABS) disintegrating tablet 2 mg  2 mg Oral Daily Antonieta Pert, MD   2 mg at 04/25/20 0901  . risperiDONE (RISPERDAL M-TABS) disintegrating tablet 4 mg  4 mg Oral QHS Antonieta Pert, MD   4 mg at 04/24/20 2101  . traZODone (DESYREL) tablet 100 mg  100 mg Oral QHS PRN Maryagnes Amos, FNP   100 mg at 04/24/20 2101    Lab Results:  Results for orders placed or performed during the hospital encounter of 04/17/20 (from the past 48 hour(s))  Vitamin B12     Status: None   Collection Time: 04/24/20  6:15 PM  Result Value Ref Range   Vitamin B-12 860 180 - 914 pg/mL    Comment: (NOTE) This assay is not validated for testing neonatal or myeloproliferative syndrome specimens for Vitamin B12 levels. Performed at [facility], [address], [place], [place] [address]   Folate     Status: None   Collection Time: 04/24/20  6:15 PM  Result Value Ref Range   Folate 19.8 >5.9 ng/mL    Comment: Performed at [facility],  [address] [place] [address], [place], [place] [address]  Iron and TIBC     Status: Abnormal   Collection Time: 04/24/20  6:15 PM  Result Value Ref Range   Iron 20 (L) 28 - 170 ug/dL   TIBC 194 174 - 081 ug/dL   Saturation Ratios 8 (L) 10.4 - 31.8 %   UIBC 242 ug/dL    Comment: Performed at [employer] [facility], [place] [address], [place], [place] [address]  Ferritin     Status: None   Collection Time: 04/24/20  6:15 PM  Result Value Ref Range   Ferritin 125 11 - 307 ng/mL    Comment: Performed at [facility], [place] [address], [place], [place] [address]  Reticulocytes     Status: Abnormal   Collection Time: 04/24/20  6:15 PM  Result Value Ref Range   Retic Ct Pct 0.9 0.4 - 3.1 %   RBC. 3.49 (L) 3.87 - 5.11 MIL/uL   Retic Count, Absolute 31.1 19.0 - 186.0 K/uL   Immature Retic Fract 12.3 2.3 - 15.9 %    Comment: Performed at [facility], [place] [address], [place], [place] [address]    Blood Alcohol level:  Lab Results  Component Value Date   [facility] <11 10/20/2014   ETH <11 92/42/6834    Metabolic Disorder Labs: Lab Results  Component Value Date   HGBA1C 5.5 03/11/2020   MPG 100 09/03/2014   Lab Results  Component Value Date   PROLACTIN 34.1 (H) 03/11/2020   Lab Results  Component Value Date   CHOL 141 04/21/2020   TRIG 54 04/21/2020   HDL 42 04/21/2020   CHOLHDL 3.4 04/21/2020   VLDL 11 04/21/2020   LDLCALC 88 04/21/2020   LDLCALC 149 (H) 10/23/2019    Physical Findings: AIMS:  , ,  ,  ,    CIWA:  CIWA-Ar Total: 9 COWS:     Musculoskeletal: Strength & Muscle Tone: within normal limits Gait & Station: normal Patient leans: N/A  Psychiatric Specialty Exam: Physical Exam  Nursing note and vitals reviewed. Constitutional: She is oriented to person, place, and time. She appears well-developed and well-nourished.  HENT:  Head: Normocephalic and atraumatic.  Respiratory: Effort normal.  Neurological: She is alert and oriented  to person, place, and time.    Review of Systems  Blood pressure 134/65, pulse (!) 112, temperature 98.8 F (37.1 C), temperature source Oral, resp. rate 20, height 5\' 2"  (1.575 m), weight 93 kg, SpO2 98 %.Body mass index is 37.49 kg/m.  General Appearance: Disheveled  Eye Contact:  Minimal  Speech:  Normal Rate  Volume:  Decreased  Mood:  Anxious and Dysphoric  Affect:  Congruent  Thought Process:  Goal Directed and Descriptions of Associations: Circumstantial  Orientation:  Full (Time, Place, and Person)  Thought Content:  Delusions, Hallucinations: Auditory, Paranoid Ideation and Rumination  Suicidal Thoughts:  No  Homicidal Thoughts:  No  Memory:  Immediate;   Poor Recent;   Poor Remote;   Poor  Judgement:  Impaired  Insight:  Lacking  Psychomotor Activity:  Increased  Concentration:  Concentration: Fair and Attention Span: Fair  Recall:  [employer] of Knowledge:  Fair  Language:  Good  Akathisia:  Negative  Handed:  Right  AIMS (if indicated):     Assets:  Desire for Improvement Resilience  ADL's:  Intact  Cognition:  WNL  Sleep:  Number of Hours: 5.75     Treatment Plan Summary: Daily contact with patient to assess and evaluate symptoms and progress in treatment, Medication management and Plan : Patient is seen and examined.  Patient is a 29 year old female with the above-stated past psychiatric history who is seen in follow-up.   Diagnosis: 1.  Schizoaffective disorder; bipolar type 2.  Iron deficiency anemia  Findings on examination today: 1.  Continue paranoia and resistance to treatment 2.  Slightly improved  sleep 3.  Labs consistent with iron deficiency anemia.\  Plan: 1.  Continue Cogentin 0.5 mg p.o. twice daily for tremor associated with Risperdal. 2.  Continue lorazepam 1 mg p.o. every 6 hours as needed anxiety. 3.  Increase Trileptal to 300 mg p.o. daily and 450 mg p.o. nightly for mood stability, anxiety and sleep. 4.  Continue Risperdal 2 mg  p.o. daily and 4 mg p.o. nightly for psychosis. 5.  Continue trazodone 100 mg p.o. nightly as needed insomnia. 6.  Add iron 325 mg p.o. twice daily with food for anemia. 7.  Disposition-at this point is still pending given continued psychotic symptoms.  Antonieta Pert, MD 04/25/2020, 11:17 AM

## 2020-04-26 MED ORDER — RISPERIDONE 2 MG PO TBDP
2.0000 mg | ORAL_TABLET | Freq: Every day | ORAL | Status: DC
  Administered 2020-04-27: 2 mg via ORAL
  Filled 2020-04-26 (×4): qty 1
  Filled 2020-04-26: qty 2

## 2020-04-26 MED ORDER — LITHIUM CARBONATE ER 300 MG PO TBCR
300.0000 mg | EXTENDED_RELEASE_TABLET | Freq: Two times a day (BID) | ORAL | Status: DC
  Administered 2020-04-26 – 2020-04-28 (×6): 300 mg via ORAL
  Filled 2020-04-26 (×10): qty 1

## 2020-04-26 MED ORDER — ZIPRASIDONE HCL 60 MG PO CAPS
60.0000 mg | ORAL_CAPSULE | Freq: Two times a day (BID) | ORAL | Status: DC
  Filled 2020-04-26 (×2): qty 1

## 2020-04-26 MED ORDER — RISPERIDONE 2 MG PO TBDP
3.0000 mg | ORAL_TABLET | Freq: Every day | ORAL | Status: DC
  Administered 2020-04-26: 3 mg via ORAL
  Filled 2020-04-26 (×3): qty 1
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
## 2020-04-26 NOTE — Progress Notes (Signed)
[facility] MD Progress Note  04/26/2020 11:33 AM Natalie [name]  MRN:  [id number] Subjective:  Patient is a 29 year old female with a past psychiatric history significant for schizoaffective disorder; bipolar type versus bipolar disorder who presented to the behavioral health hospital under involuntary commitment on 04/17/2020 with worsening psychotic symptoms.  Objective: Patient is seen and examined.  Patient is a 29 year old female with the above-stated past psychiatric history who is seen in follow-up.  She continues to be paranoid.  At least today she is willing to discuss medication changes.  We discussed the possibility of changing the Risperdal to Seroquel or Geodon.  She has had problems with Geodon in the past.  She stated that she was willing to consider either Depakote or lithium.  She had apparently been treated successfully with lithium in the past.  I told her if we added the lithium we would leave the Risperdal alone.  She is agreeable to that.  Review of her laboratories revealed normal creatinine at 0.46.  Her TSH was normal at 0.711.  Her vital signs are stable, she is afebrile.  She slept 5.75 hours last night.  Principal Problem: <principal problem not specified> Diagnosis: Active Problems:   Bipolar disorder, current episode manic severe with psychotic features (HCC)  Total Time spent with patient: 20 minutes  Past Psychiatric History: See admission H&P  Past Medical History:  Past Medical History:  Diagnosis Date  . Anxiety   . Chronic bipolar disorder (HCC)   . Depression   . H/O suicide attempt 05-2013   OD  . Obesity   . OCD (obsessive compulsive disorder)   . Psychiatric diagnosis    History reviewed. No pertinent surgical history. Family History:  Family History  Problem Relation Age of Onset  . Hypertension Mother   . Depression Father   . Hypertension Father   . Bipolar disorder Father   . Hypertension Maternal Grandmother   . Obesity Maternal Grandmother    . Hypertension Paternal Grandmother    Family Psychiatric  History: See admission H&P Social History:  Social History   Substance and Sexual Activity  Alcohol Use No     Social History   Substance and Sexual Activity  Drug Use No    Social History   Socioeconomic History  . Marital status: Married    Spouse name: Not on file  . Number of children: Not on file  . Years of education: Not on file  . Highest education level: Not on file  Occupational History  . Not on file  Tobacco Use  . Smoking status: Never Smoker  . Smokeless tobacco: Never Used  Substance and Sexual Activity  . Alcohol use: No  . Drug use: No  . Sexual activity: Yes    Birth control/protection: I.U.D.  Other Topics Concern  . Not on file  Social History Narrative   Husband is [employer], dances, and sings for fun    Social Determinants of Health   Financial Resource Strain:   . Difficulty of Paying Living Expenses:   Food Insecurity:   . Worried About Programme researcher, broadcasting/film/video in the Last Year:   . Barista in the Last Year:   Transportation Needs:   . Freight forwarder (Medical):   Marland Kitchen Lack of Transportation (Non-Medical):   Physical Activity:   . Days of Exercise per Week:   . Minutes of Exercise per Session:   Stress:   . Feeling of  Stress :   Social Connections:   . Frequency of Communication with Friends and Family:   . Frequency of Social Gatherings with Friends and Family:   . Attends Religious Services:   . Active Member of Clubs or Organizations:   . Attends Banker Meetings:   Marland Kitchen Marital Status:    Additional Social History:    Pain Medications: see MAR Prescriptions: see MAR Over the Counter: see MAR History of alcohol / drug use?: No history of alcohol / drug abuse                    Sleep: Fair  Appetite:  Fair  Current Medications: Current Facility-Administered Medications  Medication Dose Route Frequency Provider  Last Rate Last Admin  . acetaminophen (TYLENOL) tablet 500 mg  500 mg Oral Q6H PRN Armandina Stammer I, NP      . B-complex with vitamin C tablet 1 tablet  1 tablet Oral Daily Maryagnes Amos, FNP   1 tablet at 04/26/20 0749  . benztropine (COGENTIN) tablet 0.5 mg  0.5 mg Oral BID PRN Cobos, Fernando A, MD      . ferrous sulfate tablet 325 mg  325 mg Oral BID WC Antonieta Pert, MD   325 mg at 04/26/20 0749  . lithium carbonate (LITHOBID) CR tablet 300 mg  300 mg Oral Q12H Antonieta Pert, MD      . LORazepam (ATIVAN) tablet 1 mg  1 mg Oral Q6H PRN Antonieta Pert, MD   1 mg at 04/25/20 2100  . Oxcarbazepine (TRILEPTAL) tablet 300 mg  300 mg Oral Daily Antonieta Pert, MD   300 mg at 04/26/20 0749  . OXcarbazepine (TRILEPTAL) tablet 450 mg  450 mg Oral QHS Antonieta Pert, MD   450 mg at 04/25/20 2100  . risperiDONE (RISPERDAL M-TABS) disintegrating tablet 2 mg  2 mg Oral Q8H PRN Antonieta Pert, MD   2 mg at 04/25/20 1456   And  . ziprasidone (GEODON) injection 20 mg  20 mg Intramuscular PRN Antonieta Pert, MD      . risperiDONE (RISPERDAL M-TABS) disintegrating tablet 2 mg  2 mg Oral Daily Antonieta Pert, MD      . risperiDONE (RISPERDAL M-TABS) disintegrating tablet 3 mg  3 mg Oral QHS Antonieta Pert, MD      . traZODone (DESYREL) tablet 100 mg  100 mg Oral QHS PRN Maryagnes Amos, FNP   100 mg at 04/25/20 2100    Lab Results:  Results for orders placed or performed during the hospital encounter of 04/17/20 (from the past 48 hour(s))  Vitamin B12     Status: None   Collection Time: 04/24/20  6:15 PM  Result Value Ref Range   Vitamin B-12 860 180 - 914 pg/mL    Comment: (NOTE) This assay is not validated for testing neonatal or myeloproliferative syndrome specimens for Vitamin B12 levels. Performed at [facility], [address], [place], [place] [address]   Folate     Status: None   Collection Time: 04/24/20  6:15 PM  Result  Value Ref Range   Folate 19.8 >5.9 ng/mL    Comment: Performed at [facility], [address] [place], [place] [address]  Iron and TIBC     Status: Abnormal   Collection Time: 04/24/20  6:15 PM  Result Value Ref Range   Iron 20 (L) 28 - 170 ug/dL   TIBC  262 250 - 450 ug/dL   Saturation Ratios 8 (L) 10.4 - 31.8 %   UIBC 242 ug/dL    Comment: Performed at [facility], [place] [address], [place], [place] [address]  Ferritin     Status: None   Collection Time: 04/24/20  6:15 PM  Result Value Ref Range   Ferritin 125 11 - 307 ng/mL    Comment: Performed at [facility], [place] [address], [place], [place] [address]  Reticulocytes     Status: Abnormal   Collection Time: 04/24/20  6:15 PM  Result Value Ref Range   Retic Ct Pct 0.9 0.4 - 3.1 %   RBC. 3.49 (L) 3.87 - 5.11 MIL/uL   Retic Count, Absolute 31.1 19.0 - 186.0 K/uL   Immature Retic Fract 12.3 2.3 - 15.9 %    Comment: Performed at [facility], [place] [address], [place], [place] [address]    Blood Alcohol level:  Lab Results  Component Value Date   [facility] <11 10/20/2014   ETH <11 89/37/3428    Metabolic Disorder Labs: Lab Results  Component Value Date   HGBA1C 5.5 03/11/2020   MPG 100 09/03/2014   Lab Results  Component Value Date   PROLACTIN 34.1 (H) 03/11/2020   Lab Results  Component Value Date   CHOL 141 04/21/2020   TRIG 54 04/21/2020   HDL 42 04/21/2020   CHOLHDL 3.4 04/21/2020   VLDL 11 04/21/2020   LDLCALC 88 04/21/2020   LDLCALC 149 (H) 10/23/2019    Physical Findings: AIMS:  , ,  ,  ,    CIWA:  CIWA-Ar Total: 9 COWS:     Musculoskeletal: Strength & Muscle Tone: within normal limits Gait & Station: normal Patient leans: N/A  Psychiatric Specialty Exam: Physical Exam  Nursing note and vitals reviewed. Constitutional: She is oriented to person, place, and time. She appears well-developed and well-nourished.  HENT:  Head:  Normocephalic and atraumatic.  Respiratory: Effort normal.  Neurological: She is alert and oriented to person, place, and time.    Review of Systems  Blood pressure 114/79, pulse (!) 112, temperature 98.6 F (37 C), temperature source Oral, resp. rate 20, height 5\' 2"  (1.575 m), weight 93 kg, SpO2 98 %.Body mass index is 37.49 kg/m.  General Appearance: Disheveled  Eye Contact:  Fair  Speech:  Normal Rate  Volume:  Decreased  Mood:  Anxious and Dysphoric  Affect:  Congruent  Thought Process:  Goal Directed and Descriptions of Associations: Circumstantial  Orientation:  Full (Time, Place, and Person)  Thought Content:  Delusions and Paranoid Ideation  Suicidal Thoughts:  No  Homicidal Thoughts:  No  Memory:  Immediate;   Fair Recent;   Fair Remote;   Fair  Judgement:  Intact  Insight:  Lacking  Psychomotor Activity:  Increased  Concentration:  Concentration: Fair and Attention Span: Fair  Recall:  [employer] of Knowledge:  Fair  Language:  Good  Akathisia:  Negative  Handed:  Right  AIMS (if indicated):     Assets:  Desire for Improvement Housing Resilience  ADL's:  Intact  Cognition:  WNL  Sleep:  Number of Hours: 5.75     Treatment Plan Summary: Daily contact with patient to assess and evaluate symptoms and progress in treatment, Medication management and Plan : Patient is seen and examined.  Patient is a 29 year old female with the above-stated past psychiatric history who is seen in follow-up.   Diagnosis: 1.  Schizoaffective disorder; bipolar type 2.  Iron deficiency anemia  Findings on examination today: 1.  Continue paranoia, but more open to medication changes. 2.  Sleep still continues to be an issue. 3.  Tolerating treatment for iron deficiency anemia.  Plan: 1.  Patient has agreed to a trial of lithium carbonate.  We will start lithium carbonate CR 300 mg p.o. twice daily for mood stability. 2.  Continue Cogentin 0.5 mg p.o. twice daily for tremor  associated with Risperdal. 3.  Continue lorazepam 1 mg p.o. every 6 hours as needed anxiety. 4.    Continue Trileptal to 300 mg p.o. daily and 450 mg p.o. nightly for mood stability, anxiety and sleep. 5.  Continue Risperdal 2 mg p.o. daily and decrease nightly dose to 3 mg for psychosis.   6.  Continue trazodone 100 mg p.o. nightly as needed insomnia. 7.    Continue iron 325 mg p.o. twice daily with food for anemia. 8.  Disposition-at this point is still pending given continued psychotic symptoms.  Antonieta Pert, MD 04/26/2020, 11:33 AM

## 2020-04-26 NOTE — Progress Notes (Signed)
SPIRITUALITY GROUP NOTE  Pt attended spirituality group facilitated by Wilkie Aye, MDIv, BCC.  Group Description: Group focused on topic of hope. Patients participated in facilitated discussion around topic, connecting with one another around experiences and definitions for hope. Group members engaged with visual explorer photos, reflecting on what hope looks like for them today. Group engaged in discussion around how their definitions of hope are present today in hospital.  Modalities: Psycho-social ed, Adlerian, Narrative, MI  Patient Progress:\ Natalie [name] was present at group introductions.  Asked to be excused after 10 minutes of group.  Did not return to group.

## 2020-04-27 MED ORDER — RISPERIDONE 2 MG PO TBDP
3.0000 mg | ORAL_TABLET | Freq: Two times a day (BID) | ORAL | Status: DC
  Administered 2020-04-27 – 2020-04-30 (×6): 3 mg via ORAL
  Filled 2020-04-27 (×11): qty 1

## 2020-04-27 NOTE — Progress Notes (Signed)
Patient has been up at the nursing station reporting that she needs a pregnancy test done because she may be pregnant. Writer informed her that this would need to be ordered by the doctor. Writer inquired how she felt about having a roommate. She reports that she was glad to have one. She has not been sleeping at night and she is currently lying in bed awake watching her roommate.She was informed of her medications for tonight. Safety maintained with 15 min checks.

## 2020-04-27 NOTE — Progress Notes (Signed)
[facility] MD Progress Note  04/27/2020 12:46 PM Natalie [name]  MRN:  [id number] Subjective:  Patient is a 29 year old female with a past psychiatric history significant for schizoaffective disorder; bipolar type versus bipolar disorder who presented to the behavioral health hospital under involuntary commitment on 04/17/2020 with worsening psychotic symptoms.  [name] found sitting in her room. She is irritable when discussing rationale for hospitalization, stating repeatedly that her husband commit her for being a threat to others but "I would never hurt anybody." She continues to report paranoid thoughts that nursing staff and her husband were switching out her medications earlier in the hospitalization. This morning she states she has received all the correct medications. She denies SI/HI/AVH. She reports sleep has been improving overnight, although 0.75 hours of sleep are recorded in the chart. She initially denies paranoid thoughts. However, she stopped this Clinical research associate in the hallway and asked to speak a second time. She states she is afraid that other patients and staff are coming into her room to rape her. She states staff have been trying to control her limb movements by changing her medications.   Principal Problem: <principal problem not specified> Diagnosis: Active Problems:   Bipolar disorder, current episode manic severe with psychotic features (HCC)  Total Time spent with patient: 15 minutes  Past Psychiatric History: See admission H&P  Past Medical History:  Past Medical History:  Diagnosis Date  . Anxiety   . Chronic bipolar disorder (HCC)   . Depression   . H/O suicide attempt 05-2013   OD  . Obesity   . OCD (obsessive compulsive disorder)   . Psychiatric diagnosis    History reviewed. No pertinent surgical history. Family History:  Family History  Problem Relation Age of Onset  . Hypertension Mother   . Depression Father   . Hypertension Father   . Bipolar disorder  Father   . Hypertension Maternal Grandmother   . Obesity Maternal Grandmother   . Hypertension Paternal Grandmother    Family Psychiatric  History: See admission H&P Social History:  Social History   Substance and Sexual Activity  Alcohol Use No     Social History   Substance and Sexual Activity  Drug Use No    Social History   Socioeconomic History  . Marital status: Married    Spouse name: Not on file  . Number of children: Not on file  . Years of education: Not on file  . Highest education level: Not on file  Occupational History  . Not on file  Tobacco Use  . Smoking status: Never Smoker  . Smokeless tobacco: Never Used  Substance and Sexual Activity  . Alcohol use: No  . Drug use: No  . Sexual activity: Yes    Birth control/protection: I.U.D.  Other Topics Concern  . Not on file  Social History Narrative   Husband is [employer], dances, and sings for fun    Social Determinants of Health   Financial Resource Strain:   . Difficulty of Paying Living Expenses:   Food Insecurity:   . Worried About Programme researcher, broadcasting/film/video in the Last Year:   . Barista in the Last Year:   Transportation Needs:   . Freight forwarder (Medical):   Marland Kitchen Lack of Transportation (Non-Medical):   Physical Activity:   . Days of Exercise per Week:   . Minutes of Exercise per Session:   Stress:   . Feeling of Stress :  Social Connections:   . Frequency of Communication with Friends and Family:   . Frequency of Social Gatherings with Friends and Family:   . Attends Religious Services:   . Active Member of Clubs or Organizations:   . Attends Archivist Meetings:   Marland Kitchen Marital Status:    Additional Social History:    Pain Medications: see MAR Prescriptions: see MAR Over the Counter: see MAR History of alcohol / drug use?: No history of alcohol / drug abuse                    Sleep: Fair  Appetite:  Good  Current  Medications: Current Facility-Administered Medications  Medication Dose Route Frequency Provider Last Rate Last Admin  . acetaminophen (TYLENOL) tablet 500 mg  500 mg Oral Q6H PRN Lindell Spar I, NP      . B-complex with vitamin C tablet 1 tablet  1 tablet Oral Daily Suella Broad, FNP   1 tablet at 04/27/20 0757  . benztropine (COGENTIN) tablet 0.5 mg  0.5 mg Oral BID PRN Cobos, Fernando A, MD      . ferrous sulfate tablet 325 mg  325 mg Oral BID WC Sharma Covert, MD   325 mg at 04/27/20 0755  . lithium carbonate (LITHOBID) CR tablet 300 mg  300 mg Oral Q12H Sharma Covert, MD   300 mg at 04/27/20 0755  . LORazepam (ATIVAN) tablet 1 mg  1 mg Oral Q6H PRN Sharma Covert, MD   1 mg at 04/25/20 2100  . Oxcarbazepine (TRILEPTAL) tablet 300 mg  300 mg Oral Daily Sharma Covert, MD   300 mg at 04/27/20 0756  . OXcarbazepine (TRILEPTAL) tablet 450 mg  450 mg Oral QHS Sharma Covert, MD   450 mg at 04/26/20 2057  . risperiDONE (RISPERDAL M-TABS) disintegrating tablet 2 mg  2 mg Oral Q8H PRN Sharma Covert, MD   2 mg at 04/25/20 1456   And  . ziprasidone (GEODON) injection 20 mg  20 mg Intramuscular PRN Sharma Covert, MD      . risperiDONE (RISPERDAL M-TABS) disintegrating tablet 2 mg  2 mg Oral Daily Sharma Covert, MD   2 mg at 04/27/20 0800  . risperiDONE (RISPERDAL M-TABS) disintegrating tablet 3 mg  3 mg Oral QHS Sharma Covert, MD   3 mg at 04/26/20 2057  . traZODone (DESYREL) tablet 100 mg  100 mg Oral QHS PRN Suella Broad, FNP   100 mg at 04/27/20 0007    Lab Results: No results found for this or any previous visit (from the past 48 hour(s)).  Blood Alcohol level:  Lab Results  Component Value Date   [facility] <11 10/20/2014   ETH <11 63/14/9702    Metabolic Disorder Labs: Lab Results  Component Value Date   HGBA1C 5.5 03/11/2020   MPG 100 09/03/2014   Lab Results  Component Value Date   PROLACTIN 34.1 (H) 03/11/2020   Lab  Results  Component Value Date   CHOL 141 04/21/2020   TRIG 54 04/21/2020   HDL 42 04/21/2020   CHOLHDL 3.4 04/21/2020   VLDL 11 04/21/2020   LDLCALC 88 04/21/2020   LDLCALC 149 (H) 10/23/2019    Physical Findings: AIMS:  , ,  ,  ,    CIWA:  CIWA-Ar Total: 9 COWS:     Musculoskeletal: Strength & Muscle Tone: within normal limits Gait & Station: normal Patient leans: N/A  Psychiatric  Specialty Exam: Physical Exam  Nursing note and vitals reviewed. Constitutional: She is oriented to person, place, and time. She appears well-developed and well-nourished.  Cardiovascular: Normal rate.  Respiratory: Effort normal.  Neurological: She is alert and oriented to person, place, and time.    Review of Systems  Constitutional: Negative.   Respiratory: Negative for cough and shortness of breath.   Psychiatric/Behavioral: Positive for sleep disturbance. Negative for agitation, behavioral problems, confusion, dysphoric mood, hallucinations, self-injury and suicidal ideas. The patient is nervous/anxious. The patient is not hyperactive.     Blood pressure 118/72, pulse (!) 125, temperature 98.9 F (37.2 C), temperature source Oral, resp. rate 20, height 5\' 2"  (1.575 m), weight 93 kg, SpO2 98 %.Body mass index is 37.49 kg/m.  General Appearance: Casual  Eye Contact:  Good  Speech:  Normal Rate  Volume:  Normal  Mood:  Irritable  Affect:  Congruent  Thought Process:  Coherent  Orientation:  Full (Time, Place, and Person)  Thought Content:  Paranoid Ideation  Suicidal Thoughts:  No  Homicidal Thoughts:  No  Memory:  Immediate;   Fair Recent;   Fair  Judgement:  Fair  Insight:  Lacking  Psychomotor Activity:  Normal  Concentration:  Concentration: Fair and Attention Span: Fair  Recall:  of Knowledge:  Fair  Language:  Fair  Akathisia:  No  Handed:  Right  AIMS (if indicated):     Assets:  Communication Skills Desire for Improvement Financial  Resources/Insurance Housing  ADL's:  Intact  Cognition:  WNL  Sleep:  Number of Hours: 0.75     Treatment Plan Summary: Daily contact with patient to assess and evaluate symptoms and progress in treatment and Medication management   Continue inpatient hospitalization.  Increase Risperdal to 3 mg PO BID for psychosis Continue lithium 300 mg PO BID for mood instability Continue Cogentin 0.5 mg PO BID PRN tremors Continue Trileptal 300 mg PO QAM, 450 mg PO QHS for mood instability Continue trazodone 100 mg PO QHS PRN insomnia Continue Ativan 1 mg PO Q6HR PRN anxiety Continue ferrous sulfate 325 mg PO BID with meals for supplementation Continue B and C vitamin PO daily for supplementation  Patient will participate in the therapeutic group milieu.  Discharge disposition in progress.   [employer], NP 04/27/2020, 12:46 PM

## 2020-04-27 NOTE — BHH Group Notes (Signed)
Adult Psychoeducational Group Note  Date:  04/27/2020 Time:  2:36 PM  Group Topic/Focus:  Identifying Needs:   The focus of this group is to help patients identify their personal needs that have been historically problematic and identify healthy behaviors to address their needs.  Participation Level:  Did Not Attend   Dione Housekeeper 04/27/2020, 2:36 PM

## 2020-04-27 NOTE — Progress Notes (Signed)
   04/27/20 1500  Psych Admission Type (Psych Patients Only)  Admission Status Involuntary  Psychosocial Assessment  Patient Complaints Confusion  Eye Contact Fair  Facial Expression Anxious  Affect Preoccupied  Speech Logical/coherent  Interaction Assertive  Motor Activity Pacing  Appearance/Hygiene Unremarkable  Behavior Characteristics Anxious  Mood Preoccupied;Suspicious  Thought Chartered certified accountant of ideas  Content Blaming others;Obsessions  Delusions Paranoid  Perception WDL  Hallucination Auditory  Judgment Limited  Confusion Mild  Danger to Self  Current suicidal ideation? Denies  Danger to Others  Danger to Others None reported or observed

## 2020-04-27 NOTE — BHH Group Notes (Signed)
Adult Psychoeducational Group Note  Date:  04/27/2020 Time:  2:38 PM  Group Topic/Focus:  Identifying Needs:   The focus of this group is to help patients identify their personal needs that have been historically problematic and identify healthy behaviors to address their needs.  Participation Level:  Did Not Attend   Dione Housekeeper 04/27/2020, 2:38 PM

## 2020-04-27 NOTE — Progress Notes (Signed)
   04/27/20 1950  COVID-19 Daily Checkoff  Have you had a fever (temp > 37.80C/100F)  in the past 24 hours?  No  If you have had runny nose, nasal congestion, sneezing in the past 24 hours, has it worsened? No  COVID-19 EXPOSURE  Have you traveled outside the state in the past 14 days? No  Have you been in contact with someone with a confirmed diagnosis of COVID-19 or PUI in the past 14 days without wearing appropriate PPE? No  Have you been living in the same home as a person with confirmed diagnosis of COVID-19 or a PUI (household contact)? No  Have you been diagnosed with COVID-19? No

## 2020-04-28 LAB — PREGNANCY, URINE: Preg Test, Ur: NEGATIVE

## 2020-04-28 MED ORDER — OXCARBAZEPINE 300 MG PO TABS
300.0000 mg | ORAL_TABLET | Freq: Two times a day (BID) | ORAL | Status: DC
  Administered 2020-04-28 – 2020-05-01 (×6): 300 mg via ORAL
  Filled 2020-04-28 (×8): qty 1

## 2020-04-28 MED ORDER — TRAZODONE HCL 150 MG PO TABS
150.0000 mg | ORAL_TABLET | Freq: Every evening | ORAL | Status: DC | PRN
  Administered 2020-04-28 – 2020-04-29 (×2): 150 mg via ORAL
  Filled 2020-04-28 (×2): qty 1

## 2020-04-28 MED ORDER — DOCUSATE SODIUM 100 MG PO CAPS
100.0000 mg | ORAL_CAPSULE | Freq: Two times a day (BID) | ORAL | Status: DC
  Administered 2020-04-28 – 2020-05-02 (×8): 100 mg via ORAL
  Filled 2020-04-28 (×2): qty 14
  Filled 2020-04-28 (×12): qty 1

## 2020-04-28 NOTE — Progress Notes (Signed)
Pt reported that she has been having problems with constipation lately.  She added that she used to take Colace daily but hasn't in a while.  She was willing to try MOM today and speak with MD later in reference to daily stool softener.

## 2020-04-28 NOTE — Progress Notes (Signed)
[facility] MD Progress Note  04/28/2020 10:02 AM Natalie [name]  MRN:  [id number] Subjective:  Patient is a 29 year old female with a past psychiatric history significant for schizoaffective disorder; bipolar type versus bipolar disorder who presented to the behavioral health hospital under involuntary commitment on 04/17/2020 with worsening psychotic symptoms.  Ms. Roane found sitting in the dayroom. She appears fatigued. She reports good sleep overnight but states that she is tired because her HS medications are too sedating. Nursing reports that she only slept two hours overnight. She was labile, tearful yesterday afternoon. Mood appears more stable at this time, although patient appears fatigued. She is concerned that she may be pregnant. She reports that she and her husband have been trying to get pregnant. Patient was advised admission hcg was negative but is requesting another pregnancy test. She does appear calmer and less paranoid this morning. She is no longer expressing paranoia about her medications and expresses some insight that she needs medications for mood stability, particularly lithium. She denies SI/HI/AVH. She shows no signs of responding to internal stimuli.  Principal Problem: <principal problem not specified> Diagnosis: Active Problems:   Bipolar disorder, current episode manic severe with psychotic features (HCC)  Total Time spent with patient: 15 minutes  Past Psychiatric History: See admission H&P  Past Medical History:  Past Medical History:  Diagnosis Date  . Anxiety   . Chronic bipolar disorder (HCC)   . Depression   . H/O suicide attempt 05-2013   OD  . Obesity   . OCD (obsessive compulsive disorder)   . Psychiatric diagnosis    History reviewed. No pertinent surgical history. Family History:  Family History  Problem Relation Age of Onset  . Hypertension Mother   . Depression Father   . Hypertension Father   . Bipolar disorder Father   . Hypertension  Maternal Grandmother   . Obesity Maternal Grandmother   . Hypertension Paternal Grandmother    Family Psychiatric  History: See admission H&P Social History:  Social History   Substance and Sexual Activity  Alcohol Use No     Social History   Substance and Sexual Activity  Drug Use No    Social History   Socioeconomic History  . Marital status: Married    Spouse name: Not on file  . Number of children: Not on file  . Years of education: Not on file  . Highest education level: Not on file  Occupational History  . Not on file  Tobacco Use  . Smoking status: Never Smoker  . Smokeless tobacco: Never Used  Substance and Sexual Activity  . Alcohol use: No  . Drug use: No  . Sexual activity: Yes    Birth control/protection: I.U.D.  Other Topics Concern  . Not on file  Social History Narrative   Husband is [employer], dances, and sings for fun    Social Determinants of Health   Financial Resource Strain:   . Difficulty of Paying Living Expenses:   Food Insecurity:   . Worried About Programme researcher, broadcasting/film/video in the Last Year:   . Barista in the Last Year:   Transportation Needs:   . Freight forwarder (Medical):   Marland Kitchen Lack of Transportation (Non-Medical):   Physical Activity:   . Days of Exercise per Week:   . Minutes of Exercise per Session:   Stress:   . Feeling of Stress :   Social Connections:   . Frequency of  Communication with Friends and Family:   . Frequency of Social Gatherings with Friends and Family:   . Attends Religious Services:   . Active Member of Clubs or Organizations:   . Attends Archivist Meetings:   Marland Kitchen Marital Status:    Additional Social History:    Pain Medications: see MAR Prescriptions: see MAR Over the Counter: see MAR History of alcohol / drug use?: No history of alcohol / drug abuse                    Sleep: Poor  Appetite:  Fair  Current Medications: Current Facility-Administered  Medications  Medication Dose Route Frequency Provider Last Rate Last Admin  . acetaminophen (TYLENOL) tablet 500 mg  500 mg Oral Q6H PRN Lindell Spar I, NP      . B-complex with vitamin C tablet 1 tablet  1 tablet Oral Daily Suella Broad, FNP   1 tablet at 04/28/20 0753  . benztropine (COGENTIN) tablet 0.5 mg  0.5 mg Oral BID PRN Cobos, Fernando A, MD      . ferrous sulfate tablet 325 mg  325 mg Oral BID WC Sharma Covert, MD   325 mg at 04/28/20 0753  . lithium carbonate (LITHOBID) CR tablet 300 mg  300 mg Oral Q12H Sharma Covert, MD   300 mg at 04/28/20 0753  . LORazepam (ATIVAN) tablet 1 mg  1 mg Oral Q6H PRN Sharma Covert, MD   1 mg at 04/27/20 1558  . Oxcarbazepine (TRILEPTAL) tablet 300 mg  300 mg Oral Daily Sharma Covert, MD   300 mg at 04/27/20 0756  . OXcarbazepine (TRILEPTAL) tablet 450 mg  450 mg Oral QHS Sharma Covert, MD   450 mg at 04/27/20 2108  . risperiDONE (RISPERDAL M-TABS) disintegrating tablet 2 mg  2 mg Oral Q8H PRN Sharma Covert, MD   2 mg at 04/25/20 1456   And  . ziprasidone (GEODON) injection 20 mg  20 mg Intramuscular PRN Sharma Covert, MD      . risperiDONE (RISPERDAL M-TABS) disintegrating tablet 3 mg  3 mg Oral BID Connye Burkitt, NP   3 mg at 04/27/20 2109  . traZODone (DESYREL) tablet 100 mg  100 mg Oral QHS PRN Suella Broad, FNP   100 mg at 04/27/20 2108    Lab Results: No results found for this or any previous visit (from the past 48 hour(s)).  Blood Alcohol level:  Lab Results  Component Value Date   [facility] <11 10/20/2014   ETH <11 53/29/9242    Metabolic Disorder Labs: Lab Results  Component Value Date   HGBA1C 5.5 03/11/2020   MPG 100 09/03/2014   Lab Results  Component Value Date   PROLACTIN 34.1 (H) 03/11/2020   Lab Results  Component Value Date   CHOL 141 04/21/2020   TRIG 54 04/21/2020   HDL 42 04/21/2020   CHOLHDL 3.4 04/21/2020   VLDL 11 04/21/2020   LDLCALC 88 04/21/2020    LDLCALC 149 (H) 10/23/2019    Physical Findings: AIMS:  , ,  ,  ,    CIWA:  CIWA-Ar Total: 9 COWS:     Musculoskeletal: Strength & Muscle Tone: within normal limits Gait & Station: normal Patient leans: N/A  Psychiatric Specialty Exam: Physical Exam  Nursing note and vitals reviewed. Constitutional: She is oriented to person, place, and time. She appears well-developed and well-nourished.  Cardiovascular: Normal rate.  Respiratory: Effort normal.  Neurological: She is alert and oriented to person, place, and time.    Review of Systems  Constitutional: Negative.   Respiratory: Negative for cough and shortness of breath.   Psychiatric/Behavioral: Positive for sleep disturbance. Negative for agitation, behavioral problems, decreased concentration, dysphoric mood, hallucinations, self-injury and suicidal ideas. The patient is nervous/anxious. The patient is not hyperactive.     Blood pressure 124/76, pulse 97, temperature 98.9 F (37.2 C), temperature source Oral, resp. rate 16, height 5\' 2"  (1.575 m), weight 93 kg, SpO2 98 %.Body mass index is 37.49 kg/m.  General Appearance: Casual  Eye Contact:  Good  Speech:  Normal Rate  Volume:  Normal  Mood:  Euthymic  Affect:  Flat  Thought Process:  Coherent  Orientation:  Full (Time, Place, and Person)  Thought Content:  Logical  Suicidal Thoughts:  No  Homicidal Thoughts:  No  Memory:  Immediate;   Fair Recent;   Fair  Judgement:  Fair  Insight:  Lacking  Psychomotor Activity:  Normal  Concentration:  Concentration: Fair and Attention Span: Fair  Recall:  of Knowledge:  Fair  Language:  Good  Akathisia:  No  Handed:  Right  AIMS (if indicated):     Assets:  Communication Skills Desire for Improvement Resilience Social Support  ADL's:  Intact  Cognition:  WNL  Sleep:  Number of Hours: 1.75     Treatment Plan Summary: Daily contact with patient to assess and evaluate symptoms and progress in treatment and  Medication management   Continue inpatient hospitalization. Check lithium level  Continue Risperdal 3 mg PO BID for psychosis Continue lithium 300 mg PO BID for mood instability Continue Trileptal 300 mg PO BID for mood instability Increase trazodone to 150 mg PO QHS PRN insomnia Continue Cogentin 0.5 mg PO BID PRN tremors Continue trazodone 100 mg PO QHS PRN insomnia Continue Ativan 1 mg PO Q6HR PRN anxiety Continue ferrous sulfate 325 mg PO BID with meals for supplementation Continue B and C vitamin PO daily for supplementation  Patient will participate in the therapeutic group milieu.  Discharge disposition in progress.   [employer], NP 04/28/2020, 10:02 AM

## 2020-04-28 NOTE — Progress Notes (Signed)
Patient is requesting medication management follow up in [place] instead of [place].

## 2020-04-28 NOTE — Progress Notes (Signed)
   04/28/20 0800  Psych Admission Type (Psych Patients Only)  Admission Status Involuntary  Psychosocial Assessment  Patient Complaints Confusion  Eye Contact Fair  Facial Expression Anxious  Affect Preoccupied  Speech Logical/coherent  Interaction Assertive  Motor Activity Pacing  Appearance/Hygiene Unremarkable  Behavior Characteristics Anxious  Mood Preoccupied;Pleasant  Thought Chartered certified accountant of ideas  Content Blaming others  Delusions Paranoid  Perception WDL  Hallucination Auditory  Judgment Limited  Confusion Mild  Danger to Self  Current suicidal ideation? Denies  Danger to Others  Danger to Others None reported or observed  Pt goal is to work on herself and stay on track with medications. Urine for pregnancy test collected per Patient request.

## 2020-04-29 LAB — LITHIUM LEVEL: Lithium Lvl: 0.31 mmol/L — ABNORMAL LOW (ref 0.60–1.20)

## 2020-04-29 MED ORDER — SENNOSIDES-DOCUSATE SODIUM 8.6-50 MG PO TABS
1.0000 | ORAL_TABLET | Freq: Every day | ORAL | Status: DC
  Administered 2020-04-29 – 2020-05-01 (×3): 1 via ORAL
  Filled 2020-04-29 (×3): qty 1
  Filled 2020-04-29: qty 7
  Filled 2020-04-29: qty 1

## 2020-04-29 MED ORDER — LITHIUM CARBONATE ER 450 MG PO TBCR
450.0000 mg | EXTENDED_RELEASE_TABLET | Freq: Two times a day (BID) | ORAL | Status: DC
  Administered 2020-04-29 – 2020-05-02 (×7): 450 mg via ORAL
  Filled 2020-04-29 (×4): qty 1
  Filled 2020-04-29: qty 14
  Filled 2020-04-29 (×5): qty 1
  Filled 2020-04-29: qty 14
  Filled 2020-04-29: qty 1

## 2020-04-29 NOTE — Progress Notes (Signed)
[facility] MD Progress Note  04/29/2020 12:37 PM Natalie [name]  MRN:  [id number] Subjective:  Patient is a 29 year old female with a past psychiatric history significant for schizoaffective disorder; bipolar type versus bipolar disorder who presented to the behavioral health hospital under involuntary commitment on 04/17/2020 with worsening psychotic symptoms.  Objective: Patient is seen and examined.  Patient is a 29 year old female with the above-stated past psychiatric history seen in follow-up.  She actually looks a little bit better than on 5/28 when I last saw her.  Her paranoia has decreased although still present.  Her sleep is not great, but at least is stable.  There was agitation on the floor today and last night and she was concerned about that.  She is less focusing on divorcing her husband, but still has some doubts about the relationship.  She denied any side effects to her current medications.  Her vital signs are stable, she is afebrile.  She slept 4.75 hours last night.  We discussed the need for laboratories in a.m. tomorrow.  She denied any suicidal or homicidal ideation.  She denied any auditory or visual hallucinations.  Principal Problem: <principal problem not specified> Diagnosis: Active Problems:   Bipolar disorder, current episode manic severe with psychotic features (HCC)  Total Time spent with patient: 20 minutes  Past Psychiatric History: See admission H&P  Past Medical History:  Past Medical History:  Diagnosis Date  . Anxiety   . Chronic bipolar disorder (HCC)   . Depression   . H/O suicide attempt 05-2013   OD  . Obesity   . OCD (obsessive compulsive disorder)   . Psychiatric diagnosis    History reviewed. No pertinent surgical history. Family History:  Family History  Problem Relation Age of Onset  . Hypertension Mother   . Depression Father   . Hypertension Father   . Bipolar disorder Father   . Hypertension Maternal Grandmother   . Obesity Maternal  Grandmother   . Hypertension Paternal Grandmother    Family Psychiatric  History: See admission H&P Social History:  Social History   Substance and Sexual Activity  Alcohol Use No     Social History   Substance and Sexual Activity  Drug Use No    Social History   Socioeconomic History  . Marital status: Married    Spouse name: Not on file  . Number of children: Not on file  . Years of education: Not on file  . Highest education level: Not on file  Occupational History  . Not on file  Tobacco Use  . Smoking status: Never Smoker  . Smokeless tobacco: Never Used  Substance and Sexual Activity  . Alcohol use: No  . Drug use: No  . Sexual activity: Yes    Birth control/protection: I.U.D.  Other Topics Concern  . Not on file  Social History Narrative   Husband is [employer], dances, and sings for fun    Social Determinants of Health   Financial Resource Strain:   . Difficulty of Paying Living Expenses:   Food Insecurity:   . Worried About Programme researcher, broadcasting/film/video in the Last Year:   . Barista in the Last Year:   Transportation Needs:   . Freight forwarder (Medical):   Marland Kitchen Lack of Transportation (Non-Medical):   Physical Activity:   . Days of Exercise per Week:   . Minutes of Exercise per Session:   Stress:   . Feeling of  Stress :   Social Connections:   . Frequency of Communication with Friends and Family:   . Frequency of Social Gatherings with Friends and Family:   . Attends Religious Services:   . Active Member of Clubs or Organizations:   . Attends Banker Meetings:   Marland Kitchen Marital Status:    Additional Social History:    Pain Medications: see MAR Prescriptions: see MAR Over the Counter: see MAR History of alcohol / drug use?: No history of alcohol / drug abuse                    Sleep: Fair  Appetite:  Fair  Current Medications: Current Facility-Administered Medications  Medication Dose Route  Frequency Provider Last Rate Last Admin  . acetaminophen (TYLENOL) tablet 500 mg  500 mg Oral Q6H PRN Armandina Stammer I, NP      . B-complex with vitamin C tablet 1 tablet  1 tablet Oral Daily Maryagnes Amos, FNP   1 tablet at 04/29/20 0907  . benztropine (COGENTIN) tablet 0.5 mg  0.5 mg Oral BID PRN Cobos, Fernando A, MD      . docusate sodium (COLACE) capsule 100 mg  100 mg Oral BID Aldean Baker, NP   100 mg at 04/29/20 0907  . ferrous sulfate tablet 325 mg  325 mg Oral BID WC Antonieta Pert, MD   325 mg at 04/29/20 4650  . lithium carbonate (ESKALITH) CR tablet 450 mg  450 mg Oral Q12H Antonieta Pert, MD   450 mg at 04/29/20 3546  . LORazepam (ATIVAN) tablet 1 mg  1 mg Oral Q6H PRN Antonieta Pert, MD   1 mg at 04/27/20 1558  . Oxcarbazepine (TRILEPTAL) tablet 300 mg  300 mg Oral BID Aldean Baker, NP   300 mg at 04/29/20 5681  . risperiDONE (RISPERDAL M-TABS) disintegrating tablet 2 mg  2 mg Oral Q8H PRN Antonieta Pert, MD   2 mg at 04/25/20 1456   And  . ziprasidone (GEODON) injection 20 mg  20 mg Intramuscular PRN Antonieta Pert, MD      . risperiDONE (RISPERDAL M-TABS) disintegrating tablet 3 mg  3 mg Oral BID Aldean Baker, NP   3 mg at 04/29/20 1030  . senna-docusate (Senokot-S) tablet 1 tablet  1 tablet Oral QHS Antonieta Pert, MD      . traZODone (DESYREL) tablet 150 mg  150 mg Oral QHS PRN Aldean Baker, NP   150 mg at 04/28/20 2128    Lab Results:  Results for orders placed or performed during the hospital encounter of 04/17/20 (from the past 48 hour(s))  Pregnancy, urine     Status: None   Collection Time: 04/28/20  7:35 AM  Result Value Ref Range   Preg Test, Ur NEGATIVE NEGATIVE    Comment: Performed at [facility], [address] [place], [place] [address]  Lithium level     Status: Abnormal   Collection Time: 04/29/20  6:20 AM  Result Value Ref Range   Lithium Lvl 0.31 (L) 0.60 - 1.20 mmol/L    Comment: Performed at  [facility], [address], [place], [place] [address]    Blood Alcohol level:  Lab Results  Component Value Date   [facility] <11 10/20/2014   ETH <11 10/09/2014    Metabolic Disorder Labs: Lab Results  Component Value Date   HGBA1C 5.5 03/11/2020   MPG 100 09/03/2014  Lab Results  Component Value Date   PROLACTIN 34.1 (H) 03/11/2020   Lab Results  Component Value Date   CHOL 141 04/21/2020   TRIG 54 04/21/2020   HDL 42 04/21/2020   CHOLHDL 3.4 04/21/2020   VLDL 11 04/21/2020   LDLCALC 88 04/21/2020   LDLCALC 149 (H) 10/23/2019    Physical Findings: AIMS:  , ,  ,  ,    CIWA:  CIWA-Ar Total: 9 COWS:     Musculoskeletal: Strength & Muscle Tone: within normal limits Gait & Station: normal Patient leans: N/A  Psychiatric Specialty Exam: Physical Exam  Nursing note and vitals reviewed. Constitutional: She is oriented to person, place, and time. She appears well-developed and well-nourished.  HENT:  Head: Normocephalic and atraumatic.  Respiratory: Effort normal.  Neurological: She is alert and oriented to person, place, and time.    Review of Systems  Blood pressure 123/85, pulse (!) 107, temperature 98.6 F (37 C), temperature source Oral, resp. rate 16, height 5\' 2"  (1.575 m), weight 93 kg, SpO2 100 %.Body mass index is 37.49 kg/m.  General Appearance: Casual  Eye Contact:  Fair  Speech:  Normal Rate  Volume:  Normal  Mood:  Anxious  Affect:  Congruent  Thought Process:  Coherent and Descriptions of Associations: Loose  Orientation:  Full (Time, Place, and Person)  Thought Content:  Delusions and Paranoid Ideation  Suicidal Thoughts:  No  Homicidal Thoughts:  No  Memory:  Immediate;   Fair Recent;   Fair Remote;   Fair  Judgement:  Intact  Insight:  Fair  Psychomotor Activity:  Normal  Concentration:  Concentration: Fair and Attention Span: Fair  Recall:  [employer] of Knowledge:  Fair  Language:  Fair  Akathisia:  Negative   Handed:  Right  AIMS (if indicated):     Assets:  Desire for Improvement Housing Resilience Social Support  ADL's:  Intact  Cognition:  WNL  Sleep:  Number of Hours: 4.75     Treatment Plan Summary: Daily contact with patient to assess and evaluate symptoms and progress in treatment, Medication management and Plan : Patient is seen and examined.  Patient is a 29 year old female with the above-stated past psychiatric history was seen in follow-up.   Diagnosis: 1.  Schizoaffective disorder; bipolar type versus bipolar disorder with psychotic features 2.  Iron deficiency anemia.   Pertinent findings on examination today: 1.  Continued paranoia, but much less than on 5/28. 2.  Sleep continues to be an issue. 3.  Continues on supplemental iron for her anemia.  Plan: 1.  Increase lithium to 450 mg p.o. twice daily for mood stability. 2.Continue Cogentin 0.5 mg p.o. twice daily for tremor associated with Risperdal. 3. Continue lorazepam 1 mg p.o. every 6 hours as needed anxiety. 4.  Continue Trileptal to 300 mg p.o. twice daily for mood stability, anxiety and sleep. 5. Continue Risperdal 2 mg p.o. daily and decrease nightly dose to 3 mg for psychosis.   6. Continue trazodone 100 mg p.o. nightly as needed insomnia. 7.  Continue iron 325 mg p.o. twice daily with food for anemia. 8. Lithium level, metabolic panel and TSH in a.m. tomorrow. 9.  Disposition-in process, will contact family today about their attitude on her condition.  Sharma Covert, MD 04/29/2020, 12:37 PM

## 2020-04-29 NOTE — Progress Notes (Signed)
Patient has been in her room most of the night. Minimal interaction with staff and peers on the hall. She was compliant with her medications and is hopeful to discharge on tomorrow. Writer encouraged her to rest and not try to fight against her medications tonight. Support given and safety maintained on unit with 15 min checks.

## 2020-04-29 NOTE — Progress Notes (Signed)
Plastic headband, powder, and bandana placed in her locker.

## 2020-04-29 NOTE — Progress Notes (Signed)
   04/28/20 0800  Psych Admission Type (Psych Patients Only)  Admission Status Involuntary  Psychosocial Assessment  Patient Complaints Confusion  Eye Contact Fair  Facial Expression Anxious  Affect Preoccupied  Speech Logical/coherent  Interaction Assertive  Motor Activity Pacing  Appearance/Hygiene Unremarkable  Behavior Characteristics Anxious  Mood Preoccupied;Pleasant  Thought Chartered certified accountant of ideas  Content Blaming others  Delusions Paranoid  Perception WDL  Hallucination Auditory  Judgment Limited  Confusion Mild  Danger to Self  Current suicidal ideation? Denies  Danger to Others  Danger to Others None reported or observed

## 2020-04-29 NOTE — Tx Team (Signed)
Interdisciplinary Treatment and Diagnostic Plan Update  04/29/2020 Time of Session: 10:00am Natalie [name] MRN: [id number]  Principal Diagnosis: <principal problem not specified>  Secondary Diagnoses: Active Problems:   Bipolar disorder, current episode manic severe with psychotic features (HCC)   Current Medications:  Current Facility-Administered Medications  Medication Dose Route Frequency Provider Last Rate Last Admin  . acetaminophen (TYLENOL) tablet 500 mg  500 mg Oral Q6H PRN Armandina Stammer I, NP      . B-complex with vitamin C tablet 1 tablet  1 tablet Oral Daily Maryagnes Amos, FNP   1 tablet at 04/29/20 0907  . benztropine (COGENTIN) tablet 0.5 mg  0.5 mg Oral BID PRN Cobos, Fernando A, MD      . docusate sodium (COLACE) capsule 100 mg  100 mg Oral BID Aldean Baker, NP   100 mg at 04/29/20 0907  . ferrous sulfate tablet 325 mg  325 mg Oral BID WC Antonieta Pert, MD   325 mg at 04/29/20 3662  . lithium carbonate (ESKALITH) CR tablet 450 mg  450 mg Oral Q12H Antonieta Pert, MD   450 mg at 04/29/20 9476  . LORazepam (ATIVAN) tablet 1 mg  1 mg Oral Q6H PRN Antonieta Pert, MD   1 mg at 04/27/20 1558  . Oxcarbazepine (TRILEPTAL) tablet 300 mg  300 mg Oral BID Aldean Baker, NP   300 mg at 04/29/20 5465  . risperiDONE (RISPERDAL M-TABS) disintegrating tablet 2 mg  2 mg Oral Q8H PRN Antonieta Pert, MD   2 mg at 04/25/20 1456   And  . ziprasidone (GEODON) injection 20 mg  20 mg Intramuscular PRN Antonieta Pert, MD      . risperiDONE (RISPERDAL M-TABS) disintegrating tablet 3 mg  3 mg Oral BID Aldean Baker, NP   3 mg at 04/29/20 1030  . senna-docusate (Senokot-S) tablet 1 tablet  1 tablet Oral QHS Antonieta Pert, MD      . traZODone (DESYREL) tablet 150 mg  150 mg Oral QHS PRN Aldean Baker, NP   150 mg at 04/28/20 2128   PTA Medications: Medications Prior to Admission  Medication Sig Dispense Refill Last Dose  . acetaminophen (TYLENOL) 500 MG  tablet Take 1 tablet (500 mg total) by mouth every 6 (six) hours as needed. 30 tablet 0 Past Month at Unknown time  . benztropine (COGENTIN) 0.5 MG tablet Take 1 tablet (0.5 mg total) by mouth daily. 30 tablet 0   . Prenatal w/o A Vit-Fe Fum-FA (PRENATAL VITAMIN W/FE, FA) 29-1 MG CHEW Chew 1 tablet by mouth daily. 360 tablet 0   . risperiDONE (RISPERDAL) 0.25 MG tablet Take 1 tablet (0.25 mg total) by mouth 2 (two) times daily. 60 tablet 0   . traZODone (DESYREL) 50 MG tablet Take 1 tablet (50 mg total) by mouth at bedtime. 30 tablet 0     Patient Stressors: Marital or family conflict Medication change or noncompliance  Patient Strengths: Geographical information systems officer for treatment/growth  Treatment Modalities: Medication Management, Group therapy, Case management,  1 to 1 session with clinician, Psychoeducation, Recreational therapy.   Physician Treatment Plan for Primary Diagnosis: <principal problem not specified> Long Term Goal(s): Improvement in symptoms so as ready for discharge Improvement in symptoms so as ready for discharge   Short Term Goals: Ability to identify changes in lifestyle to reduce recurrence of condition will improve Ability to verbalize feelings will improve Ability to demonstrate self-control will improve Ability  to identify and develop effective coping behaviors will improve Ability to maintain clinical measurements within normal limits will improve Compliance with prescribed medications will improve Ability to identify changes in lifestyle to reduce recurrence of condition will improve Ability to verbalize feelings will improve Ability to demonstrate self-control will improve Ability to identify and develop effective coping behaviors will improve Ability to maintain clinical measurements within normal limits will improve Compliance with prescribed medications will improve  Medication Management: Evaluate patient's response, side effects, and  tolerance of medication regimen.  Therapeutic Interventions: 1 to 1 sessions, Unit Group sessions and Medication administration.  Evaluation of Outcomes: Progressing  Physician Treatment Plan for Secondary Diagnosis: Active Problems:   Bipolar disorder, current episode manic severe with psychotic features (HCC)  Long Term Goal(s): Improvement in symptoms so as ready for discharge Improvement in symptoms so as ready for discharge   Short Term Goals: Ability to identify changes in lifestyle to reduce recurrence of condition will improve Ability to verbalize feelings will improve Ability to demonstrate self-control will improve Ability to identify and develop effective coping behaviors will improve Ability to maintain clinical measurements within normal limits will improve Compliance with prescribed medications will improve Ability to identify changes in lifestyle to reduce recurrence of condition will improve Ability to verbalize feelings will improve Ability to demonstrate self-control will improve Ability to identify and develop effective coping behaviors will improve Ability to maintain clinical measurements within normal limits will improve Compliance with prescribed medications will improve     Medication Management: Evaluate patient's response, side effects, and tolerance of medication regimen.  Therapeutic Interventions: 1 to 1 sessions, Unit Group sessions and Medication administration.  Evaluation of Outcomes: Progressing   RN Treatment Plan for Primary Diagnosis: <principal problem not specified> Long Term Goal(s): Knowledge of disease and therapeutic regimen to maintain health will improve  Short Term Goals: Ability to participate in decision making will improve, Ability to verbalize feelings will improve, Ability to identify and develop effective coping behaviors will improve and Compliance with prescribed medications will improve  Medication Management: RN will  administer medications as ordered by provider, will assess and evaluate patient's response and provide education to patient for prescribed medication. RN will report any adverse and/or side effects to prescribing provider.  Therapeutic Interventions: 1 on 1 counseling sessions, Psychoeducation, Medication administration, Evaluate responses to treatment, Monitor vital signs and CBGs as ordered, Perform/monitor CIWA, COWS, AIMS and Fall Risk screenings as ordered, Perform wound care treatments as ordered.  Evaluation of Outcomes: Progressing   LCSW Treatment Plan for Primary Diagnosis: <principal problem not specified> Long Term Goal(s): Safe transition to appropriate next level of care at discharge, Engage patient in therapeutic group addressing interpersonal concerns.  Short Term Goals: Engage patient in aftercare planning with referrals and resources, Increase social support, Increase ability to appropriately verbalize feelings, Identify triggers associated with mental health/substance abuse issues and Increase skills for wellness and recovery  Therapeutic Interventions: Assess for all discharge needs, 1 to 1 time with Social worker, Explore available resources and support systems, Assess for adequacy in community support network, Educate family and significant other(s) on suicide prevention, Complete Psychosocial Assessment, Interpersonal group therapy.  Evaluation of Outcomes: Progressing  Progress in Treatment: Attending groups: Yes. Participating in groups: Yes. Taking medication as prescribed: Yes. Toleration medication: Yes.  Family/Significant other contact made: No, will contact:  Pt declined consents Patient understands diagnosis: Yes. Discussing patient identified problems/goals with staff: Yes. Medical problems stabilized or resolved: No. Denies suicidal/homicidal ideation: Yes.  Issues/concerns per patient self-inventory: No. Other: none  New problem(s) identified: No,  Describe:  none.  New Short Term/Long Term Goal(s): medication management for mood stabilization; elimination of SI thoughts; development of comprehensive mental wellness/sobriety plan.  Patient Goals:  "Gain stability"   Discharge Plan or Barriers: Following up with her provider, Dr.Aktar on 06/01.   Reason for Continuation of Hospitalization: Anxiety Delusions  Medication stabilization  Estimated Length of Stay: 3-5 days   Attendees: Patient: Natalie [name] 04/29/2020   Physician: Myles Lipps, MD 04/29/2020   Nursing: Neldon Newport, RN 04/29/2020   RN Care Manager: 04/29/2020   Social Worker: Stephanie Acre, [place] 04/29/2020   Recreational Therapist:  04/29/2020   Other:  04/29/2020   Other:  04/29/2020   Other: 04/29/2020     Scribe for Treatment Team: Joellen Jersey, [place] 04/29/2020 11:49 AM

## 2020-04-29 NOTE — Progress Notes (Signed)
   04/29/20 2100  Psych Admission Type (Psych Patients Only)  Admission Status Involuntary  Psychosocial Assessment  Patient Complaints Suspiciousness;Restlessness  Eye Contact Fair  Facial Expression Anxious  Affect Preoccupied  Speech Logical/coherent  Interaction Assertive  Motor Activity Pacing  Appearance/Hygiene Unremarkable  Behavior Characteristics Pacing  Mood Suspicious;Preoccupied  Thought Chartered certified accountant of ideas  Content Blaming others  Delusions Paranoid  Perception WDL  Hallucination Auditory  Judgment Limited  Confusion Mild  Danger to Self  Current suicidal ideation? Denies  Danger to Others  Danger to Others None reported or observed

## 2020-04-29 NOTE — Progress Notes (Signed)
   04/28/20 2020  COVID-19 Daily Checkoff  Have you had a fever (temp > 37.80C/100F)  in the past 24 hours?  No  If you have had runny nose, nasal congestion, sneezing in the past 24 hours, has it worsened? No  COVID-19 EXPOSURE  Have you traveled outside the state in the past 14 days? No  Have you been in contact with someone with a confirmed diagnosis of COVID-19 or PUI in the past 14 days without wearing appropriate PPE? No  Have you been living in the same home as a person with confirmed diagnosis of COVID-19 or a PUI (household contact)? No  Have you been diagnosed with COVID-19? No

## 2020-04-30 ENCOUNTER — Telehealth (HOSPITAL_COMMUNITY): Payer: Medicaid Other | Admitting: Psychiatry

## 2020-04-30 LAB — COMPREHENSIVE METABOLIC PANEL
ALT: 46 U/L — ABNORMAL HIGH (ref 0–44)
AST: 37 U/L (ref 15–41)
Albumin: 3.7 g/dL (ref 3.5–5.0)
Alkaline Phosphatase: 67 U/L (ref 38–126)
Anion gap: 14 (ref 5–15)
BUN: 7 mg/dL (ref 6–20)
CO2: 23 mmol/L (ref 22–32)
Calcium: 9.3 mg/dL (ref 8.9–10.3)
Chloride: 105 mmol/L (ref 98–111)
Creatinine, Ser: 0.51 mg/dL (ref 0.44–1.00)
GFR calc Af Amer: >60 mL/min (ref >60)
GFR calc non Af Amer: >60 mL/min (ref >60)
Glucose, Bld: 94 mg/dL (ref 70–99)
Potassium: 4.1 mmol/L (ref 3.5–5.1)
Sodium: 142 mmol/L (ref 135–145)
Total Bilirubin: 0.8 mg/dL (ref 0.3–1.2)
Total Protein: 8.3 g/dL — ABNORMAL HIGH (ref 6.5–8.1)

## 2020-04-30 LAB — TSH: TSH: 0.568 u[IU]/mL (ref 0.350–4.500)

## 2020-04-30 LAB — LITHIUM LEVEL: Lithium Lvl: 0.57 mmol/L — ABNORMAL LOW (ref 0.60–1.20)

## 2020-04-30 MED ORDER — OLANZAPINE 10 MG PO TBDP
10.0000 mg | ORAL_TABLET | Freq: Every day | ORAL | Status: DC
  Administered 2020-04-30 – 2020-05-01 (×2): 10 mg via ORAL
  Filled 2020-04-30 (×3): qty 1
  Filled 2020-04-30: qty 7

## 2020-04-30 MED ORDER — TRAZODONE HCL 150 MG PO TABS
150.0000 mg | ORAL_TABLET | Freq: Every day | ORAL | Status: DC
  Administered 2020-04-30 – 2020-05-01 (×2): 150 mg via ORAL
  Filled 2020-04-30: qty 7
  Filled 2020-04-30 (×4): qty 1

## 2020-04-30 NOTE — B[facility] Counselor (Signed)
Supervisor accompanied pt to virtual court review of her IVC.  Esaw Dace agreed to extend the IVC for 7 additional days. Garner Nash, MSW, LCSW Advanced Care Supervisor 04/30/2020 2:19 PM

## 2020-04-30 NOTE — Progress Notes (Signed)
Pt slept 1 hr last night

## 2020-04-30 NOTE — [facility] Counselor (Signed)
CSW faxed court letter to Ms.Marla Roe at [phone]. Treatment team is requesting an extension of patient's IVC for up to 7 more days for medication stabilization.  Enid Cutter, MSW, LCSW-A Clinical Social Worker [facility]

## 2020-04-30 NOTE — Progress Notes (Signed)
Pt continues to be paranoid and somatic at times.    04/30/20 2200  Psych Admission Type (Psych Patients Only)  Admission Status Involuntary  Psychosocial Assessment  Patient Complaints Suspiciousness  Eye Contact Fair  Facial Expression Anxious  Affect Preoccupied  Speech Logical/coherent  Interaction Assertive  Motor Activity Pacing  Appearance/Hygiene Unremarkable  Behavior Characteristics Cooperative  Mood Preoccupied  Thought Chartered certified accountant of ideas  Content Blaming others  Delusions Paranoid  Perception WDL  Hallucination Auditory  Judgment Limited  Confusion Mild  Danger to Self  Current suicidal ideation? Denies  Danger to Others  Danger to Others None reported or observed

## 2020-04-30 NOTE — Progress Notes (Signed)
Pt up complaining of constipation. Pt stated she may think the Trileptal may be too much, pt thinks it is draining her. Pt encouraged to talk to doctor about possible MOM or mag citrate

## 2020-04-30 NOTE — Progress Notes (Signed)
[facility] MD Progress Note  04/30/2020 12:52 PM Natalie [name]  MRN:  [id number] Subjective:  Patient is a 29 year old female with a past psychiatric history significant for schizoaffective disorder; bipolar type versus bipolar disorder who presented to the behavioral health hospital under involuntary commitment on 04/17/2020 with worsening psychotic symptoms.  Objective: Patient is seen and examined.  Patient is a 29 year old female with the above-stated past psychiatric history who is seen in follow-up.  Unfortunately she had a roommate placed in her room last night, and this exacerbated her paranoia.  She only slept about an hour last night.  She is more agitated today, and although lethargic from fatigue paranoia has increased as well.  She is now concerned about the fact that she is taking too many pills.  She feels like there is too many pills for her to keep track of.  He has agreed to stop the Risperdal and to then attempt to change the Zyprexa.  Her vital signs are stable although she is mildly tachycardic this morning.  Again she only slept 1 hour.  Laboratories from this a.m. showed normal electrolytes except for a mildly elevated ALT at 46.  6 months ago it was 10.  Her AST is normal.  Her lithium level was 0.57 (and this is when her dosage was 300 mg p.o. twice daily.  TSH was normal at 0.568.  Principal Problem: <principal problem not specified> Diagnosis: Active Problems:   Bipolar disorder, current episode manic severe with psychotic features ([place])  Total Time spent with patient: 20 minutes  Past Psychiatric History: See admission H&P  Past Medical History:  Past Medical History:  Diagnosis Date   Anxiety    Chronic bipolar disorder ([place])    Depression    H/O suicide attempt 05-2013   OD   Obesity    OCD (obsessive compulsive disorder)    Psychiatric diagnosis    History reviewed. No pertinent surgical history. Family History:  Family History  Problem Relation Age of  Onset   Hypertension Mother    Depression Father    Hypertension Father    Bipolar disorder Father    Hypertension Maternal Grandmother    Obesity Maternal Grandmother    Hypertension Paternal Grandmother    Family Psychiatric  History: See admission H&P Social History:  Social History   Substance and Sexual Activity  Alcohol Use No     Social History   Substance and Sexual Activity  Drug Use No    Social History   Socioeconomic History   Marital status: Married    Spouse name: Not on file   Number of children: Not on file   Years of education: Not on file   Highest education level: Not on file  Occupational History   Not on file  Tobacco Use   Smoking status: Never Smoker   Smokeless tobacco: Never Used  Substance and Sexual Activity   Alcohol use: No   Drug use: No   Sexual activity: Yes    Birth control/protection: I.U.D.  Other Topics Concern   Not on file  Social History Narrative   Husband is [employer], dances, and sings for fun    Social Determinants of Health   Financial Resource Strain:    Difficulty of Paying Living Expenses:   Food Insecurity:    Worried About Charity fundraiser in the Last Year:    Arboriculturist in the Last Year:   Transportation Needs:  Lack of Transportation (Medical):    Lack of Transportation (Non-Medical):   Physical Activity:    Days of Exercise per Week:    Minutes of Exercise per Session:   Stress:    Feeling of Stress :   Social Connections:    Frequency of Communication with Friends and Family:    Frequency of Social Gatherings with Friends and Family:    Attends Religious Services:    Active Member of Clubs or Organizations:    Attends Banker Meetings:    Marital Status:    Additional Social History:    Pain Medications: see MAR Prescriptions: see MAR Over the Counter: see MAR History of alcohol / drug use?: No history of alcohol /  drug abuse                    Sleep: Poor  Appetite:  Fair  Current Medications: Current Facility-Administered Medications  Medication Dose Route Frequency Provider Last Rate Last Admin   acetaminophen (TYLENOL) tablet 500 mg  500 mg Oral Q6H PRN Armandina Stammer I, NP       benztropine (COGENTIN) tablet 0.5 mg  0.5 mg Oral BID PRN Cobos, Rockey Situ, MD       docusate sodium (COLACE) capsule 100 mg  100 mg Oral BID Marciano Sequin E, NP   100 mg at 04/30/20 0748   lithium carbonate (ESKALITH) CR tablet 450 mg  450 mg Oral Q12H Antonieta Pert, MD   450 mg at 04/30/20 0748   LORazepam (ATIVAN) tablet 1 mg  1 mg Oral Q6H PRN Antonieta Pert, MD   1 mg at 04/27/20 1558   OLANZapine zydis (ZYPREXA) disintegrating tablet 10 mg  10 mg Oral QHS Antonieta Pert, MD       Oxcarbazepine (TRILEPTAL) tablet 300 mg  300 mg Oral BID Aldean Baker, NP   300 mg at 04/30/20 0748   risperiDONE (RISPERDAL M-TABS) disintegrating tablet 2 mg  2 mg Oral Q8H PRN Antonieta Pert, MD   2 mg at 04/25/20 1456   And   ziprasidone (GEODON) injection 20 mg  20 mg Intramuscular PRN Antonieta Pert, MD       senna-docusate (Senokot-S) tablet 1 tablet  1 tablet Oral QHS Antonieta Pert, MD   1 tablet at 04/29/20 2057   traZODone (DESYREL) tablet 150 mg  150 mg Oral QHS PRN Aldean Baker, NP   150 mg at 04/29/20 2057    Lab Results:  Results for orders placed or performed during the hospital encounter of 04/17/20 (from the past 48 hour(s))  Lithium level     Status: Abnormal   Collection Time: 04/29/20  6:20 AM  Result Value Ref Range   Lithium Lvl 0.31 (L) 0.60 - 1.20 mmol/L    Comment: Performed at [facility], [address] [place], [place] [address]  Lithium level     Status: Abnormal   Collection Time: 04/30/20  6:15 AM  Result Value Ref Range   Lithium Lvl 0.57 (L) 0.60 - 1.20 mmol/L    Comment: Performed at [facility], [address], [place], [place] [address]  Comprehensive metabolic panel     Status: Abnormal   Collection Time: 04/30/20  6:15 AM  Result Value Ref Range   Sodium 142 135 - 145 mmol/L   Potassium 4.1 3.5 - 5.1 mmol/L   Chloride 105 98 - 111 mmol/L   CO2 23 22 -  32 mmol/L   Glucose, Bld 94 70 - 99 mg/dL    Comment: Glucose reference range applies only to samples taken after fasting for at least 8 hours.   BUN 7 6 - 20 mg/dL   Creatinine, Ser 1.02 0.44 - 1.00 mg/dL   Calcium 9.3 8.9 - 58.5 mg/dL   Total Protein 8.3 (H) 6.5 - 8.1 g/dL   Albumin 3.7 3.5 - 5.0 g/dL   AST 37 15 - 41 U/L   ALT 46 (H) 0 - 44 U/L   Alkaline Phosphatase 67 38 - 126 U/L   Total Bilirubin 0.8 0.3 - 1.2 mg/dL   GFR calc non Af Amer >60 >60 mL/min   GFR calc Af Amer >60 >60 mL/min   Anion gap 14 5 - 15    Comment: Performed at [facility], [address] [place], [place] [address]  TSH     Status: None   Collection Time: 04/30/20  6:15 AM  Result Value Ref Range   TSH 0.568 0.350 - 4.500 uIU/mL    Comment: Performed by a 3rd Generation assay with a functional sensitivity of <=0.01 uIU/mL. Performed at [facility], [address], [place], [place] [address]     Blood Alcohol level:  Lab Results  Component Value Date   [facility] <11 10/20/2014   ETH <11 10/09/2014    Metabolic Disorder Labs: Lab Results  Component Value Date   HGBA1C 5.5 03/11/2020   MPG 100 09/03/2014   Lab Results  Component Value Date   PROLACTIN 34.1 (H) 03/11/2020   Lab Results  Component Value Date   CHOL 141 04/21/2020   TRIG 54 04/21/2020   HDL 42 04/21/2020   CHOLHDL 3.4 04/21/2020   VLDL 11 04/21/2020   LDLCALC 88 04/21/2020   LDLCALC 149 (H) 10/23/2019    Physical Findings: AIMS:  , ,  ,  ,    CIWA:  CIWA-Ar Total: 9 COWS:     Musculoskeletal: Strength & Muscle Tone: within normal limits Gait & Station: normal Patient leans: N/A  Psychiatric Specialty Exam: Physical Exam  Nursing  note and vitals reviewed. Constitutional: She is oriented to person, place, and time. She appears well-developed and well-nourished.  HENT:  Head: Normocephalic and atraumatic.  Respiratory: Effort normal.  Neurological: She is alert and oriented to person, place, and time.    Review of Systems  Blood pressure 115/67, pulse (!) 113, temperature 98.6 F (37 C), temperature source Oral, resp. rate 16, height 5\' 2"  (1.575 m), weight 93 kg, SpO2 100 %.Body mass index is 37.49 kg/m.  General Appearance: Disheveled  Eye Contact:  Poor  Speech:  Normal Rate  Volume:  Decreased  Mood:  Dysphoric  Affect:  Flat  Thought Process:  Goal Directed and Descriptions of Associations: Circumstantial  Orientation:  Full (Time, Place, and Person)  Thought Content:  Delusions and Paranoid Ideation  Suicidal Thoughts:  No  Homicidal Thoughts:  No  Memory:  Immediate;   Poor Recent;   Poor Remote;   Poor  Judgement:  Intact  Insight:  Fair  Psychomotor Activity:  Decreased  Concentration:  Concentration: Fair and Attention Span: Fair  Recall:  of Knowledge:  Fair  Language:  Fair  Akathisia:  Negative  Handed:  Right  AIMS (if indicated):     Assets:  Desire for Improvement Resilience  ADL's:  Impaired  Cognition:  WNL  Sleep:  Number of Hours: 1  Treatment Plan Summary: Daily contact with patient to assess and evaluate symptoms and progress in treatment, Medication management and Plan : Patient is seen and examined.  Patient is a 29 year old female with the above-stated past psychiatric history who is seen in follow-up.   Diagnosis: 1.  Schizoaffective disorder; bipolar type versus bipolar disorder with psychotic features 2.  Iron deficiency anemia.   Pertinent findings on examination today: 1.  Continued paranoia significantly worse than 5/31. 2.  Sleep issues worsened last p.m.. 3.  Patient request to stop supplemental iron for her anemia.  Plan: 1.  Continue  lithium carbonate 450 mg p.o. twice daily for mood stability. 2.  Continue Cogentin 0.5 mg p.o. twice daily for tremor. 3.  Continue lorazepam 1 mg p.o. every 6 hours as needed anxiety. 4.  Continue Trileptal 300 mg p.o. twice daily for mood stability, anxiety and sleep. 5.  Stop Risperdal. 6.  Start Zyprexa 10 mg p.o. nightly for mood stability, sleep and psychosis. 7.  Increase trazodone 250 mg p.o. nightly for insomnia. 8.  Stop iron, and vitamins. 9.  Disposition planning-in progress. Antonieta Pert, MD 04/30/2020, 12:52 PM

## 2020-05-01 MED ORDER — OXCARBAZEPINE 300 MG PO TABS
300.0000 mg | ORAL_TABLET | Freq: Two times a day (BID) | ORAL | Status: DC
  Administered 2020-05-01 – 2020-05-02 (×2): 300 mg via ORAL
  Filled 2020-05-01 (×3): qty 1
  Filled 2020-05-01: qty 14
  Filled 2020-05-01: qty 1
  Filled 2020-05-01: qty 14

## 2020-05-01 MED ORDER — OXCARBAZEPINE 150 MG PO TABS
150.0000 mg | ORAL_TABLET | Freq: Two times a day (BID) | ORAL | Status: DC
  Filled 2020-05-01 (×2): qty 1

## 2020-05-01 MED ORDER — MAGNESIUM CITRATE PO SOLN
1.0000 | Freq: Once | ORAL | Status: DC
  Filled 2020-05-01: qty 296

## 2020-05-01 NOTE — Progress Notes (Signed)
The patient's positive event for the day is that she tried to stay around people and was successful. She states that she is sleeping better. Her goal is to get discharged soon.

## 2020-05-01 NOTE — Progress Notes (Signed)
Pt visible on the unit, pt pleasant this evening with brighter affect    05/01/20 2100  Psych Admission Type (Psych Patients Only)  Admission Status Involuntary  Psychosocial Assessment  Patient Complaints Anxiety  Eye Contact Fair  Facial Expression Anxious  Affect Anxious  Speech Logical/coherent  Interaction Assertive  Motor Activity Slow  Appearance/Hygiene Unremarkable  Behavior Characteristics Cooperative  Mood Anxious;Pleasant  Thought Chartered certified accountant of ideas  Content Blaming others  Delusions None reported or observed  Perception WDL  Hallucination None reported or observed  Judgment Limited  Confusion None  Danger to Self  Current suicidal ideation? Denies  Danger to Others  Danger to Others None reported or observed

## 2020-05-01 NOTE — Progress Notes (Signed)
Recreation Therapy Notes  Date:  6.2.21 Time: 1110 Location: 500 Hall Dayroom  Group Topic: Stress Management  Goal Area(s) Addresses:  Patient will identify positive stress management techniques. Patient will identify benefits of using stress management post d/c.  Behavioral Response: Engaged  Intervention: Stress Management  Activity:  Breathing Techniques and Meditation.  LRT read a script to guide patients through the proper breathing techniques.  LRT then played a meditation that focused on letting go of the past and focusing on the present.   Education:  Stress Management, Discharge Planning.   Education Outcome: Acknowledges Education  Clinical Observations/Feedback: Pt was quiet but attentive during group session.  Pt focused on getting her breathing right.  Pt appeared sleepy during group session.  Pt appeared to dose off during the meditation.      Caroll Rancher, LRT/CTRS    Caroll Rancher A 05/01/2020 12:26 PM

## 2020-05-01 NOTE — [facility] Suicide Risk Assessment (Signed)
[facility] INPATIENT:  Family/Significant Other Suicide Prevention Education  Suicide Prevention Education:  Education Completed; Spouse, Chole Driver has been identified by the patient as the family member/significant other with whom the patient will be residing, and identified as the person(s) who will aid the patient in the event of a mental health crisis (suicidal ideations/suicide attempt).  With written consent from the patient, the family member/significant other has been provided the following suicide prevention education, prior to the and/or following the discharge of the patient.  The suicide prevention education provided includes the following:  Suicide risk factors  Suicide prevention and interventions  National Suicide Hotline telephone number  [facility] assessment telephone number  [facility] Emergency Assistance 911  [facility] and/or Residential Mobile Crisis Unit telephone number  Request made of family/significant other to:  Remove weapons (e.g., guns, rifles, knives), all items previously/currently identified as safety concern.    Remove drugs/medications (over-the-counter, prescriptions, illicit drugs), all items previously/currently identified as a safety concern.  The family member/significant other verbalizes understanding of the suicide prevention education information provided.  The family member/significant other agrees to remove the items of safety concern listed above.  Patient's spouse reports he is pleased with the progress she seems to have made and would love to have her home as soon as tomorrow. Spouse reports that he is aware patient resumed taking lithium, which he notes has been most helpful for her in the past.  Spouse states "I have no concerns, I'm ready for my wife to come home."  Darreld Mclean 05/01/2020, 2:46 PM

## 2020-05-01 NOTE — Plan of Care (Signed)
Progress note  D: pt found in the hallway; compliant with medication administration. Pt denies any physical pain. Pt continues to have concerns with constipation. Pt seems more animated than the past week. Pt also seems less sedated. Pt has verbalized their gratitude to many staff members today. Pt denies si/hi/ah/vh and verbally agrees to approach staff if these become apparent or before harming themself/others while at [facility].  A: Pt provided support and encouragement. Pt given medication per protocol and standing orders. Q24m safety checks implemented and continued.  R: Pt safe on the unit. Will continue to monitor.  Pt progressing in the following metrics  Problem: Activity: Goal: Will verbalize the importance of balancing activity with adequate rest periods Outcome: Progressing   Problem: Education: Goal: Knowledge of the prescribed therapeutic regimen will improve Outcome: Progressing   Problem: Coping: Goal: Coping ability will improve Outcome: Progressing Goal: Will verbalize feelings Outcome: Progressing

## 2020-05-01 NOTE — [facility] Group Notes (Signed)
The focus of this group is to help patients establish daily goals to achieve during treatment and discuss how the patient can incorporate goal setting into their daily lives to aide in recovery.  Pt attended and participated in group 

## 2020-05-01 NOTE — [facility] Counselor (Signed)
CSW spoke with patient multiple times this morning at patient request. Patient shared she feels ready to discharge and her plan is to return home with her spouse, Natalie [name]. Patient expressed concerns regarding housing and finances, CSW encouraged patient to discuss this with her spouse. Patient reports she does not feel comfortable discussing her housing situation or money on the unit phones.  Patient requested referrals for psychiatrists in [place] and transportation assistance at discharge.  Enid Cutter, MSW, LCSW-A Clinical Social Worker [facility]

## 2020-05-01 NOTE — Progress Notes (Signed)
[facility] MD Progress Note  05/01/2020 12:55 PM Natalie [name]  MRN:  [id number] Subjective:  Patient is a 29 year old female with a past psychiatric history significant for schizoaffective disorder; bipolar type versus bipolar disorder who presented to the behavioral health hospital under involuntary commitment on 04/17/2020 with worsening psychotic symptoms.  Objective: Patient is seen and examined.  Patient is a 29 year old female with the above-stated past psychiatric history is seen in follow-up.  She allowed me to change her medicines yesterday, and she took Zyprexa 10 mg p.o. nightly last night.  The patient reported that she slept well, but nursing notes show she only slept 1 hour last night.  She stated that her mood was more stable, and that she felt much better, and wanted to go home.  Her laboratories from 6/1 showed normal electrolytes with a mild elevation of her ALT at 46.  Her lithium level was 0.57.  TSH was 0.568.  Blood pressure stable.  She is tachycardic with a rate of 116.  She had a low-grade temperature at 99.5.  Principal Problem: <principal problem not specified> Diagnosis: Active Problems:   Bipolar disorder, current episode manic severe with psychotic features ([place])  Total Time spent with patient: 20 minutes  Past Psychiatric History: See admission H&P  Past Medical History:  Past Medical History:  Diagnosis Date  . Anxiety   . Chronic bipolar disorder ([place])   . Depression   . H/O suicide attempt 05-2013   OD  . Obesity   . OCD (obsessive compulsive disorder)   . Psychiatric diagnosis    History reviewed. No pertinent surgical history. Family History:  Family History  Problem Relation Age of Onset  . Hypertension Mother   . Depression Father   . Hypertension Father   . Bipolar disorder Father   . Hypertension Maternal Grandmother   . Obesity Maternal Grandmother   . Hypertension Paternal Grandmother    Family Psychiatric  History: See admission H&P Social  History:  Social History   Substance and Sexual Activity  Alcohol Use No     Social History   Substance and Sexual Activity  Drug Use No    Social History   Socioeconomic History  . Marital status: Married    Spouse name: Not on file  . Number of children: Not on file  . Years of education: Not on file  . Highest education level: Not on file  Occupational History  . Not on file  Tobacco Use  . Smoking status: Never Smoker  . Smokeless tobacco: Never Used  Substance and Sexual Activity  . Alcohol use: No  . Drug use: No  . Sexual activity: Yes    Birth control/protection: I.U.D.  Other Topics Concern  . Not on file  Social History Narrative   Husband is [employer], dances, and sings for fun    Social Determinants of Health   Financial Resource Strain:   . Difficulty of Paying Living Expenses:   Food Insecurity:   . Worried About Charity fundraiser in the Last Year:   . Arboriculturist in the Last Year:   Transportation Needs:   . Film/video editor (Medical):   Marland Kitchen Lack of Transportation (Non-Medical):   Physical Activity:   . Days of Exercise per Week:   . Minutes of Exercise per Session:   Stress:   . Feeling of Stress :   Social Connections:   . Frequency of Communication with Friends  and Family:   . Frequency of Social Gatherings with Friends and Family:   . Attends Religious Services:   . Active Member of Clubs or Organizations:   . Attends Banker Meetings:   Marland Kitchen Marital Status:    Additional Social History:    Pain Medications: see MAR Prescriptions: see MAR Over the Counter: see MAR History of alcohol / drug use?: No history of alcohol / drug abuse                    Sleep: Poor  Appetite:  Fair  Current Medications: Current Facility-Administered Medications  Medication Dose Route Frequency Provider Last Rate Last Admin  . acetaminophen (TYLENOL) tablet 500 mg  500 mg Oral Q6H PRN Armandina Stammer  I, NP      . benztropine (COGENTIN) tablet 0.5 mg  0.5 mg Oral BID PRN Cobos, Fernando A, MD      . docusate sodium (COLACE) capsule 100 mg  100 mg Oral BID Aldean Baker, NP   100 mg at 05/01/20 0745  . lithium carbonate (ESKALITH) CR tablet 450 mg  450 mg Oral Q12H Antonieta Pert, MD   450 mg at 05/01/20 0745  . LORazepam (ATIVAN) tablet 1 mg  1 mg Oral Q6H PRN Antonieta Pert, MD   1 mg at 04/27/20 1558  . OLANZapine zydis (ZYPREXA) disintegrating tablet 10 mg  10 mg Oral QHS Antonieta Pert, MD   10 mg at 04/30/20 2051  . OXcarbazepine (TRILEPTAL) tablet 150 mg  150 mg Oral BID Antonieta Pert, MD      . risperiDONE (RISPERDAL M-TABS) disintegrating tablet 2 mg  2 mg Oral Q8H PRN Antonieta Pert, MD   2 mg at 04/25/20 1456   And  . ziprasidone (GEODON) injection 20 mg  20 mg Intramuscular PRN Antonieta Pert, MD      . senna-docusate (Senokot-S) tablet 1 tablet  1 tablet Oral QHS Antonieta Pert, MD   1 tablet at 04/30/20 2051  . traZODone (DESYREL) tablet 150 mg  150 mg Oral QHS Antonieta Pert, MD   150 mg at 04/30/20 2050    Lab Results:  Results for orders placed or performed during the hospital encounter of 04/17/20 (from the past 48 hour(s))  Lithium level     Status: Abnormal   Collection Time: 04/30/20  6:15 AM  Result Value Ref Range   Lithium Lvl 0.57 (L) 0.60 - 1.20 mmol/L    Comment: Performed at [facility], [address] [place], [place] [address]  Comprehensive metabolic panel     Status: Abnormal   Collection Time: 04/30/20  6:15 AM  Result Value Ref Range   Sodium 142 135 - 145 mmol/L   Potassium 4.1 3.5 - 5.1 mmol/L   Chloride 105 98 - 111 mmol/L   CO2 23 22 - 32 mmol/L   Glucose, Bld 94 70 - 99 mg/dL    Comment: Glucose reference range applies only to samples taken after fasting for at least 8 hours.   BUN 7 6 - 20 mg/dL   Creatinine, Ser 9.62 0.44 - 1.00 mg/dL   Calcium 9.3 8.9 - 22.9 mg/dL   Total Protein 8.3  (H) 6.5 - 8.1 g/dL   Albumin 3.7 3.5 - 5.0 g/dL   AST 37 15 - 41 U/L   ALT 46 (H) 0 - 44 U/L   Alkaline Phosphatase 67 38 - 126 U/L   Total  Bilirubin 0.8 0.3 - 1.2 mg/dL   GFR calc non Af Amer >60 >60 mL/min   GFR calc Af Amer >60 >60 mL/min   Anion gap 14 5 - 15    Comment: Performed at [facility], [address], [place], [place] [address]  TSH     Status: None   Collection Time: 04/30/20  6:15 AM  Result Value Ref Range   TSH 0.568 0.350 - 4.500 uIU/mL    Comment: Performed by a 3rd Generation assay with a functional sensitivity of <=0.01 uIU/mL. Performed at [facility] [place], [address] [place], [place] [address]     Blood Alcohol level:  Lab Results  Component Value Date   [facility] <11 10/20/2014   ETH <11 10/09/2014    Metabolic Disorder Labs: Lab Results  Component Value Date   HGBA1C 5.5 03/11/2020   MPG 100 09/03/2014   Lab Results  Component Value Date   PROLACTIN 34.1 (H) 03/11/2020   Lab Results  Component Value Date   CHOL 141 04/21/2020   TRIG 54 04/21/2020   HDL 42 04/21/2020   CHOLHDL 3.4 04/21/2020   VLDL 11 04/21/2020   LDLCALC 88 04/21/2020   LDLCALC 149 (H) 10/23/2019    Physical Findings: AIMS:  , ,  ,  ,    CIWA:  CIWA-Ar Total: 9 COWS:     Musculoskeletal: Strength & Muscle Tone: within normal limits Gait & Station: normal Patient leans: N/A  Psychiatric Specialty Exam: Physical Exam  Nursing note and vitals reviewed. Constitutional: She is oriented to person, place, and time. She appears well-developed and well-nourished.  HENT:  Head: Normocephalic and atraumatic.  Respiratory: Effort normal.  Neurological: She is alert and oriented to person, place, and time.    Review of Systems  Blood pressure 140/83, pulse (!) 122, temperature 99.5 F (37.5 C), temperature source Oral, resp. rate 16, height 5\' 2"  (1.575 m), weight 93 kg, SpO2 100 %.Body mass index is 37.49 kg/m.  General Appearance:  Disheveled  Eye Contact:  Fair  Speech:  Normal Rate  Volume:  Normal  Mood:  Dysphoric and Irritable  Affect:  Congruent  Thought Process:  Goal Directed and Descriptions of Associations: Circumstantial  Orientation:  Full (Time, Place, and Person)  Thought Content:  Delusions and Paranoid Ideation  Suicidal Thoughts:  No  Homicidal Thoughts:  No  Memory:  Immediate;   Fair Recent;   Fair Remote;   Fair  Judgement:  Impaired  Insight:  Lacking  Psychomotor Activity:  Normal  Concentration:  Concentration: Fair and Attention Span: Fair  Recall:  of Knowledge:  Fair  Language:  Fair  Akathisia:  Negative  Handed:  Right  AIMS (if indicated):     Assets:  Desire for Improvement Resilience  ADL's:  Intact  Cognition:  WNL  Sleep:  Number of Hours: 1     Treatment Plan Summary: Daily contact with patient to assess and evaluate symptoms and progress in treatment, Medication management and Plan : Patient is seen and examined.  Patient is a 29 year old female with the above-stated past psychiatric history who is seen in follow-up.  Diagnosis: 1. Schizoaffective disorder; bipolar type versus bipolar disorder with psychotic features 2. Iron deficiency anemia.   Pertinent findings on examination today: 1.  Paranoia seems to be slightly improved from yesterday. 2.  Sleep issues continue to be present.  Plan: 1.  Continue Cogentin 0.5 mg p.o. twice daily as needed tremors.  2.  Continue Colace 100 mg p.o. twice daily for constipation. 3.  Continue lithium carbonate CR 450 mg p.o. every 12 hours for mood stability. 5.  Continue lorazepam 1 mg p.o. every 6 hours as needed anxiety. 6.  Increase Zyprexa Zydis to 15 mg p.o. nightly for psychosis, mood and sleep. 7.  Continue Trileptal 300 mg p.o. twice daily for mood stability. 8.  Continue Senokot 1 tablet p.o. nightly for constipation. 9.  Continue trazodone 150 mg p.o. nightly for sleep. 10.  Disposition planning-in  progress. Antonieta Pert, MD 05/01/2020, 12:55 PM

## 2020-05-01 NOTE — BHH Group Notes (Signed)
LCSW Aftercare Discharge Planning Group Note  05/01/2020 1:30pm   Type of Group and Topic: Psychoeducational Group: Discharge Planning  Participation Level: Active  Description of Group: Discharge planning group reviews patient's anticipated discharge plans and assists patients to anticipate and address any barriers to wellness/recovery in the community. Suicide prevention education is reviewed with patients in group.  Therapeutic Goals:  1. Patients will state their anticipated discharge plan and mental health aftercare  2. Patients will identify potential barriers to wellness in the community setting  3. Patients will engage in problem solving, solution focused discussion of ways to anticipate and address barriers to wellness/recovery  Summary of Patient Progress: Natalie [name] appropriately participated in discussion and respectfully agreed with other participants discussion.   Plan for Discharge/Comments: Plans to return to live with her husband at discharge. Natalie [name] voiced her desires to receive outpatient therapy once discharged from the hospital.  Transportation Means: Not discussed  Supports: Natalie [name] was able to voice her concerns with her current level of support from family and her need for additional support.   Therapeutic Modalities: Motivational Interviewing   Ruthann Cancer MSW, [employer] Clincal Social Worker  [facility]

## 2020-05-02 ENCOUNTER — Telehealth (HOSPITAL_COMMUNITY): Payer: Medicaid Other | Admitting: Psychiatry

## 2020-05-02 MED ORDER — TRAZODONE HCL 150 MG PO TABS: 150.0000 mg | ORAL_TABLET | Freq: Every day | ORAL | 0 refills | Status: DC

## 2020-05-02 MED ORDER — BENZTROPINE MESYLATE 0.5 MG PO TABS: 0.5000 mg | ORAL_TABLET | Freq: Two times a day (BID) | ORAL | 0 refills | Status: DC | PRN

## 2020-05-02 MED ORDER — DOCUSATE SODIUM 100 MG PO CAPS: 100.0000 mg | ORAL_CAPSULE | Freq: Two times a day (BID) | ORAL | 0 refills | Status: DC

## 2020-05-02 MED ORDER — SENNOSIDES-DOCUSATE SODIUM 8.6-50 MG PO TABS: 1.0000 | ORAL_TABLET | Freq: Every day | ORAL | 0 refills | Status: DC

## 2020-05-02 MED ORDER — OLANZAPINE 10 MG PO TBDP: 10.0000 mg | ORAL_TABLET | Freq: Every day | ORAL | 0 refills | Status: DC

## 2020-05-02 MED ORDER — OXCARBAZEPINE 300 MG PO TABS: 300.0000 mg | ORAL_TABLET | Freq: Two times a day (BID) | ORAL | 0 refills | Status: DC

## 2020-05-02 MED ORDER — LITHIUM CARBONATE ER 450 MG PO TBCR: 450.0000 mg | EXTENDED_RELEASE_TABLET | Freq: Two times a day (BID) | ORAL | 0 refills | Status: DC

## 2020-05-02 MED ORDER — LORAZEPAM 1 MG PO TABS: 1.0000 mg | ORAL_TABLET | Freq: Four times a day (QID) | ORAL | 0 refills | Status: DC | PRN

## 2020-05-02 NOTE — Progress Notes (Signed)
Recreation Therapy Notes  Date: 6.3.21 Time: 1000 Location:  [address] Dayroom  Group Topic: Communication, Team Building, Problem Solving  Goal Area(s) Addresses:  Patient will effectively work with peer towards shared goal.  Patient will identify skills used to make activity successful.  Patient will identify how skills used during activity can be used to reach post d/c goals.   Behavioral Response: Engaged  Intervention: STEM Activity  Activity: Stage manager. In teams patients were given 12 plastic drinking straws and a length of masking tape. Using the materials provided patients were asked to build a landing pad to catch a golf ball dropped from approximately 6 feet in the air.   Education: Pharmacist, community, Discharge Planning   Education Outcome: Acknowledges education/In group clarification offered/Needs additional education.   Clinical Observations/Feedback: Pt was quiet but engaged in the active.  Pt worked with peer to complete the task.  Pt was active and was able to complete the task with her peer.  Pt and peer were successful in completing the task.       Caroll Rancher, LRT/CTRS     Caroll Rancher A 05/02/2020 11:17 AM

## 2020-05-02 NOTE — Progress Notes (Signed)
Recreation Therapy Notes  INPATIENT RECREATION TR PLAN  Patient Details Name: Natalie [name] MRN: [id number] DOB: February 03, 1991 Today's Date: 05/02/2020  Rec Therapy Plan Is patient appropriate for Therapeutic Recreation?: Yes Treatment times per week: about 3 days Estimated Length of Stay: 5-7 days TR Treatment/Interventions: Group participation ([name])  Discharge Criteria Pt will be discharged from therapy if:: Discharged Treatment plan/goals/alternatives discussed and agreed upon by:: Patient/family  Discharge Summary Short term goals set: See patient care plan Short term goals met: Complete Progress toward goals comments: Groups attended Which groups?: Goal setting, Stress management, Coping skills, Other ([name])(Team building, Anxiety) Reason goals not met: None Therapeutic equipment acquired: N/[name] Reason patient discharged from therapy: Discharge from hospital Pt/family agrees with progress & goals achieved: Yes Date patient discharged from therapy: 05/02/20    Natalie [name], LRT/CTRS  Natalie [name], Natalie [name] 05/02/2020, 12:00 PM

## 2020-05-02 NOTE — Progress Notes (Signed)
°  [facility] Adult Case Management Discharge Plan :  Will you be returning to the same living situation after discharge:  Yes,  will return home with husband. At discharge, do you have transportation home?: Yes,  husband is picking patient up. Do you have the ability to pay for your medications: No. Will be discharged with samples.  Release of information consent forms completed and in the chart;  Patient's signature needed at discharge.  Patient to Follow up at: Follow-up Information    Thresa Ross, MD Follow up on 05/02/2020.   Specialty: Psychiatry Why: You medication management on 05/02/20 at 1:00 pm.  This will be a Virtual appointment.  They will email you with additional appointment information.  Contact information: [address] [place] [address] [phone]           Next level of care provider has access to [facility] Link:no  Safety Planning and Suicide Prevention discussed: Yes,  with husband  Have you used any form of tobacco in the last 30 days? (Cigarettes, Smokeless Tobacco, Cigars, and/or Pipes): No  Has patient been referred to the Quitline?: Patient refused referral  Patient has been referred for addiction treatment: Pt. refused referral  Otelia Santee, LCSWA 05/02/2020, 10:00 AM

## 2020-05-02 NOTE — Plan of Care (Signed)
Pt was able to identify positive coping skills at completion of recreation therapy group sessions.   Raylyn Speckman, LRT/CTRS  

## 2020-05-02 NOTE — BHH Suicide Risk Assessment (Signed)
[facility] Discharge Suicide Risk Assessment   Principal Problem: <principal problem not specified> Discharge Diagnoses: Active Problems:   Bipolar disorder, current episode manic severe with psychotic features ([facility])   Total Time spent with patient: 15 minutes  Musculoskeletal: Strength & Muscle Tone: within normal limits Gait & Station: normal Patient leans: N/A  Psychiatric Specialty Exam: Review of Systems  Gastrointestinal: Positive for constipation.  All other systems reviewed and are negative.   Blood pressure 132/80, pulse (!) 102, temperature 98.5 F (36.9 C), temperature source Oral, resp. rate 16, height 5\' 2"  (1.575 m), weight 93 kg, SpO2 100 %.Body mass index is 37.49 kg/m.  General Appearance: Casual  Eye Contact::  Fair  Speech:  Normal Rate409  Volume:  Normal  Mood:  Euthymic  Affect:  Flat  Thought Process:  Coherent and Descriptions of Associations: Intact  Orientation:  Full (Time, Place, and Person)  Thought Content:  Delusions  Suicidal Thoughts:  No  Homicidal Thoughts:  No  Memory:  Immediate;   Fair Recent;   Fair Remote;   Fair  Judgement:  Intact  Insight:  Fair  Psychomotor Activity:  Normal  Concentration:  Fair  Recall:  [phone] of Knowledge:Fair  Language: Fair  Akathisia:  Negative  Handed:  Right  AIMS (if indicated):     Assets:  Desire for Improvement Housing Resilience Social Support  Sleep:  Number of Hours: 5.75  Cognition: WNL  ADL's:  Intact   Mental Status Per Nursing Assessment::   On Admission:  NA  Demographic Factors:  Low socioeconomic status and Unemployed  Loss Factors: NA  Historical Factors: Impulsivity  Risk Reduction Factors:   Living with another person, especially a relative  Continued Clinical Symptoms:  Schizophrenia:   Less than 77 years old Paranoid or undifferentiated type  Cognitive Features That Contribute To Risk:  None    Suicide Risk:  Minimal: No identifiable suicidal ideation.   Patients presenting with no risk factors but with morbid ruminations; may be classified as minimal risk based on the severity of the depressive symptoms  Follow-up Information    41, MD Follow up on 05/02/2020.   Specialty: Psychiatry Why: You medication management on 05/02/20 at 1:00 pm.  This will be a Virtual appointment.  They will email you with additional appointment information.  Contact information: [address] [place] [phone]           Plan Of Care/Follow-up recommendations:  Activity:  ad lib  [phone], MD 05/02/2020, 7:47 AM

## 2020-05-02 NOTE — Progress Notes (Signed)
Pt discharged to lobby. Pt was stable and appreciative at that time. All papers, samples and prescriptions were given and valuables returned. Verbal understanding expressed. Denies SI/HI and A/VH. Pt given opportunity to express concerns and ask questions.  

## 2020-05-02 NOTE — Discharge Summary (Signed)
Physician Discharge Summary Note  Patient:  Natalie [name] is an 29 y.o., female MRN:  [id number] DOB:  29-Jun-1991 Patient phone:  [phone] (home)  Patient address:   [address] [place] [address],  Total Time spent with patient: 30 minutes  Date of Admission:  04/17/2020 Date of Discharge: 05/02/20  Reason for Admission:  29 year old female with a past psychiatric history significant for reported bipolar disorder, but really appears more schizoaffective disorder; bipolar type who presented to the behavioral health hospital under involuntary commitment last night. The involuntary commitment stated that the patient had not been compliant with medications and had becoming more paranoid.  Principal Problem: Bipolar disorder, current episode manic severe with psychotic features [facility]) Discharge Diagnoses: Principal Problem:   Bipolar disorder, current episode manic severe with psychotic features [facility])   Past Psychiatric History: Patient is followed by Dr.Ahktar at our outpatient clinic.  From her old records she has been previously treated with Seroquel, Abilify, Zyprexa, Saphris, Fanapt, Invega, Risperdal, Tegretol, Lamictal, trazodone, lithium.  Her last psychiatric hospitalization at our facility was November 2015.  Past Medical History:  Past Medical History:  Diagnosis Date  . Anxiety   . Chronic bipolar disorder (HCC)   . Depression   . H/O suicide attempt 05-2013   OD  . Obesity   . OCD (obsessive compulsive disorder)   . Psychiatric diagnosis    History reviewed. No pertinent surgical history. Family History:  Family History  Problem Relation Age of Onset  . Hypertension Mother   . Depression Father   . Hypertension Father   . Bipolar disorder Father   . Hypertension Maternal Grandmother   . Obesity Maternal Grandmother   . Hypertension Paternal Grandmother    Family Psychiatric  History: None reported Social History:  Social History   Substance and  Sexual Activity  Alcohol Use No     Social History   Substance and Sexual Activity  Drug Use No    Social History   Socioeconomic History  . Marital status: Married    Spouse name: Not on file  . Number of children: Not on file  . Years of education: Not on file  . Highest education level: Not on file  Occupational History  . Not on file  Tobacco Use  . Smoking status: Never Smoker  . Smokeless tobacco: Never Used  Substance and Sexual Activity  . Alcohol use: No  . Drug use: No  . Sexual activity: Yes    Birth control/protection: I.U.D.  Other Topics Concern  . Not on file  Social History Narrative   Husband is [employer], dances, and sings for fun    Social Determinants of Health   Financial Resource Strain:   . Difficulty of Paying Living Expenses:   Food Insecurity:   . Worried About Programme researcher, broadcasting/film/video in the Last Year:   . Barista in the Last Year:   Transportation Needs:   . Freight forwarder (Medical):   Marland Kitchen Lack of Transportation (Non-Medical):   Physical Activity:   . Days of Exercise per Week:   . Minutes of Exercise per Session:   Stress:   . Feeling of Stress :   Social Connections:   . Frequency of Communication with Friends and Family:   . Frequency of Social Gatherings with Friends and Family:   . Attends Religious Services:   . Active Member of Clubs or Organizations:   . Attends Club  or Organization Meetings:   Marland Kitchen Marital Status:     Hospital Course:  Patient remained on the [facility] unit for 14 days. The patient stabilized on medication and therapy. Patient was discharged on Cogentin 0.5 mg twice daily, Colace 100 mg twice daily, lithium carbonate 450 mg every 12 hours, Ativan 1 mg every 6 hours as needed, Zyprexa 10 mg nightly, Trileptal 300 mg twice daily, trazodone 150 mg nightly, and Senokot-S 8.6-50 mg nightly. Patient has shown improvement with improved mood, affect, sleep, appetite, and interaction. Patient  has attended group and participated. Patient has been seen in the day room interacting with peers and staff appropriately. Patient denies any SI/HI/AVH and contracts for safety. Patient agrees to follow up at Dr. De Nurse. Patient is provided with prescriptions for their medications upon discharge.  Physical Findings: AIMS:  , ,  ,  ,    CIWA:  CIWA-Ar Total: 9 COWS:     Musculoskeletal: Strength & Muscle Tone: within normal limits Gait & Station: normal Patient leans: N/A  Psychiatric Specialty Exam: Physical Exam  Nursing note and vitals reviewed. Constitutional: She is oriented to person, place, and time. She appears well-developed and well-nourished.  Respiratory: Effort normal.  Musculoskeletal:        General: Normal range of motion.  Neurological: She is alert and oriented to person, place, and time.  Skin: Skin is warm.    Review of Systems  Constitutional: Negative.   HENT: Negative.   Eyes: Negative.   Respiratory: Negative.   Cardiovascular: Negative.   Gastrointestinal: Negative.   Genitourinary: Negative.   Musculoskeletal: Negative.   Skin: Negative.   Neurological: Negative.     Blood pressure 132/80, pulse (!) 102, temperature 98.5 F (36.9 C), temperature source Oral, resp. rate 16, height 5\' 2"  (1.575 m), weight 93 kg, SpO2 100 %.Body mass index is 37.49 kg/m.   General Appearance: Casual  Eye Contact::  Fair  Speech:  Normal Rate409  Volume:  Normal  Mood:  Euthymic  Affect:  Flat  Thought Process:  Coherent and Descriptions of Associations: Intact  Orientation:  Full (Time, Place, and Person)  Thought Content:  Delusions  Suicidal Thoughts:  No  Homicidal Thoughts:  No  Memory:  Immediate;   Fair Recent;   Fair Remote;   Fair  Judgement:  Intact  Insight:  Fair  Psychomotor Activity:  Normal  Concentration:  Fair  Recall:  [place]  Language: Fair  Akathisia:  Negative  Handed:  Right  AIMS (if indicated):     Assets:   Desire for Improvement Housing Resilience Social Support  Sleep:  Number of Hours: 5.75  Cognition: WNL  ADL's:  Intact   Have you used any form of tobacco in the last 30 days? (Cigarettes, Smokeless Tobacco, Cigars, and/or Pipes): No  Has this patient used any form of tobacco in the last 30 days? (Cigarettes, Smokeless Tobacco, Cigars, and/or Pipes) Yes, No  Blood Alcohol level:  Lab Results  Component Value Date   [facility] <11 10/20/2014   ETH <11 62/13/0865    Metabolic Disorder Labs:  Lab Results  Component Value Date   HGBA1C 5.5 03/11/2020   MPG 100 09/03/2014   Lab Results  Component Value Date   PROLACTIN 34.1 (H) 03/11/2020   Lab Results  Component Value Date   CHOL 141 04/21/2020   TRIG 54 04/21/2020   HDL 42 04/21/2020   CHOLHDL 3.4 04/21/2020   VLDL 11 04/21/2020   LDLCALC 88  04/21/2020   LDLCALC 149 (H) 10/23/2019    See Psychiatric Specialty Exam and Suicide Risk Assessment completed by Attending Physician prior to discharge.  Discharge destination:  Home  Is patient on multiple antipsychotic therapies at discharge:  No   Has Patient had three or more failed trials of antipsychotic monotherapy by history:  No  Recommended Plan for Multiple Antipsychotic Therapies: NA   Allergies as of 05/02/2020   No Known Allergies     Medication List    STOP taking these medications   acetaminophen 500 MG tablet Commonly known as: TYLENOL   risperiDONE 0.25 MG tablet Commonly known as: RISPERDAL     TAKE these medications     Indication  benztropine 0.5 MG tablet Commonly known as: COGENTIN Take 1 tablet (0.5 mg total) by mouth 2 (two) times daily as needed for tremors. What changed:   when to take this  reasons to take this  Indication: Extrapyramidal Reaction caused by Medications   docusate sodium 100 MG capsule Commonly known as: COLACE Take 1 capsule (100 mg total) by mouth 2 (two) times daily. For constipation  Indication: Constipation    lithium carbonate 450 MG CR tablet Commonly known as: ESKALITH Take 1 tablet (450 mg total) by mouth every 12 (twelve) hours. For mood stabilization  Indication: Mood stabilization   LORazepam 1 MG tablet Commonly known as: ATIVAN Take 1 tablet (1 mg total) by mouth every 6 (six) hours as needed for anxiety.  Indication: Feeling Anxious   OLANZapine zydis 10 MG disintegrating tablet Commonly known as: ZYPREXA Take 1 tablet (10 mg total) by mouth at bedtime. For mood control  Indication: Mood control   Oxcarbazepine 300 MG tablet Commonly known as: TRILEPTAL Take 1 tablet (300 mg total) by mouth 2 (two) times daily. For mood stabilization  Indication: Mood stabilization   prenatal vitamin w/FE, FA 29-1 MG Chew Chew 1 tablet by mouth daily.  Indication: Vitamin Deficiency   senna-docusate 8.6-50 MG tablet Commonly known as: Senokot-S Take 1 tablet by mouth at bedtime. (May buy from over the counter): For constipation  Indication: Constipation   traZODone 150 MG tablet Commonly known as: DESYREL Take 1 tablet (150 mg total) by mouth at bedtime. For sleep What changed:   medication strength  how much to take  additional instructions  Indication: Trouble Sleeping      Follow-up Information    Thresa Ross, MD Follow up on 05/02/2020.   Specialty: Psychiatry Why: You medication management on 05/02/20 at 1:00 pm.  This will be a Virtual appointment.  They will email you with additional appointment information.  Contact information: [address] [place] [address] [phone]           Follow-up recommendations:  Continue activity as tolerated. Continue diet as recommended by your PCP. Ensure to keep all appointments with outpatient providers.  Comments:  Patient is instructed prior to discharge to: Take all medications as prescribed by his/her mental healthcare provider. Report any adverse effects and or reactions from the medicines to his/her  outpatient provider promptly. Patient has been instructed & cautioned: To not engage in alcohol and or illegal drug use while on prescription medicines. In the event of worsening symptoms, patient is instructed to call the crisis hotline, 911 and or go to the nearest ED for appropriate evaluation and treatment of symptoms. To follow-up with his/her primary care provider for your other medical issues, concerns and or health care needs.    Signed:  Gerlene Burdock Kurstyn Larios, FNP 05/02/2020, 10:42 AM

## 2020-05-06 ENCOUNTER — Encounter (HOSPITAL_COMMUNITY): Payer: Self-pay | Admitting: Psychiatry

## 2020-05-06 ENCOUNTER — Telehealth (INDEPENDENT_AMBULATORY_CARE_PROVIDER_SITE_OTHER): Payer: Medicaid Other | Admitting: Psychiatry

## 2020-05-06 DIAGNOSIS — F5102 Adjustment insomnia: Secondary | ICD-10-CM | POA: Diagnosis not present

## 2020-05-06 DIAGNOSIS — F312 Bipolar disorder, current episode manic severe with psychotic features: Secondary | ICD-10-CM | POA: Diagnosis not present

## 2020-05-06 DIAGNOSIS — F411 Generalized anxiety disorder: Secondary | ICD-10-CM | POA: Diagnosis not present

## 2020-05-06 NOTE — Progress Notes (Addendum)
[facility] Follow up    Patient Identification: Eden Toohey MRN:  [id number] Date of Evaluation:  05/06/2020 Referral Source: primary care Chief Complaint:   Bipolar follow up  Hospital discharge   I connected with [name] on 05/06/20 at  3:30 PM EDT by a video enabled telemedicine application and verified that I am speaking with the correct person using two identifiers.       I discussed the limitations, risks, security and privacy concerns of performing an evaluation and management service by telephone and the availability of in person appointments. I also discussed with the patient that there may be a patient responsible charge related to this service. The patient expressed understanding and agreed to proceed. Patient location : home Provider location: home   Visit Diagnosis:    ICD-10-CM   1. Bipolar disorder, current episode manic severe with psychotic features (HCC)  F31.2   2. GAD (generalized anxiety disorder)  F41.1   3. Adjustment insomnia  F51.02     History of Present Illness: 29 year old currently married African-American female referred by primary care physician for management of bipolar disorder.  History of Fanapt use but was not covered, med was changed due to prolactin concern  Was doing fair last visit and wanted to be on risperdal  Recently admitted to hospital notes suggest non compliance. Husband was in appointment as well says she was getting Bipolarish or paranoid and got her IVC. meds adjusted in hospital now on zyprexa, trileptal,   Lithium level were low but she says she was using it before admission She feels better but sedate wants to stop trileptal says dose is too high and feel drained during the day   Modifying factors; disability income Aggravating factor mental health, past   Noncompliance or some concerns of side effects from most of the medication or antipsychotics that she has tried  Duration more then 10 years Severity some better  since hospital discharge Husband is supportive Denies tremors  Past Psychiatric History: bipolar    Past Medical History:  Past Medical History:  Diagnosis Date  . Anxiety   . Chronic bipolar disorder (HCC)   . Depression   . H/O suicide attempt 05-2013   OD  . Obesity   . OCD (obsessive compulsive disorder)   . Psychiatric diagnosis    No past surgical history on file.  Family Psychiatric History: father : bipolar has been on seroquel  Family History:  Family History  Problem Relation Age of Onset  . Hypertension Mother   . Depression Father   . Hypertension Father   . Bipolar disorder Father   . Hypertension Maternal Grandmother   . Obesity Maternal Grandmother   . Hypertension Paternal Grandmother     Social History:   Social History   Socioeconomic History  . Marital status: Married    Spouse name: Not on file  . Number of children: Not on file  . Years of education: Not on file  . Highest education level: Not on file  Occupational History  . Not on file  Tobacco Use  . Smoking status: Never Smoker  . Smokeless tobacco: Never Used  Substance and Sexual Activity  . Alcohol use: No  . Drug use: No  . Sexual activity: Yes    Birth control/protection: I.U.D.  Other Topics Concern  . Not on file  Social History Narrative   Husband is [employer], dances, and sings for fun    Social Determinants of  Health   Financial Resource Strain:   . Difficulty of Paying Living Expenses:   Food Insecurity:   . Worried About Programme researcher, broadcasting/film/video in the Last Year:   . Barista in the Last Year:   Transportation Needs:   . Freight forwarder (Medical):   Marland Kitchen Lack of Transportation (Non-Medical):   Physical Activity:   . Days of Exercise per Week:   . Minutes of Exercise per Session:   Stress:   . Feeling of Stress :   Social Connections:   . Frequency of Communication with Friends and Family:   . Frequency of Social Gatherings  with Friends and Family:   . Attends Religious Services:   . Active Member of Clubs or Organizations:   . Attends Banker Meetings:   Marland Kitchen Marital Status:      Allergies:  No Known Allergies  Metabolic Disorder Labs: Lab Results  Component Value Date   HGBA1C 5.5 03/11/2020   MPG 100 09/03/2014   Lab Results  Component Value Date   PROLACTIN 34.1 (H) 03/11/2020   Lab Results  Component Value Date   CHOL 141 04/21/2020   TRIG 54 04/21/2020   HDL 42 04/21/2020   CHOLHDL 3.4 04/21/2020   VLDL 11 04/21/2020   LDLCALC 88 04/21/2020   LDLCALC 149 (H) 10/23/2019   Lab Results  Component Value Date   TSH 0.568 04/30/2020    Therapeutic Level Labs: Lab Results  Component Value Date   LITHIUM 0.57 (L) 04/30/2020   Lab Results  Component Value Date   CBMZ 3.7 (L) 10/20/2014   Lab Results  Component Value Date   VALPROATE 79.2 07/13/2014    Current Medications: Current Outpatient Medications  Medication Sig Dispense Refill  . benztropine (COGENTIN) 0.5 MG tablet Take 1 tablet (0.5 mg total) by mouth 2 (two) times daily as needed for tremors. 60 tablet 0  . docusate sodium (COLACE) 100 MG capsule Take 1 capsule (100 mg total) by mouth 2 (two) times daily. For constipation 60 capsule 0  . lithium carbonate (ESKALITH) 450 MG CR tablet Take 1 tablet (450 mg total) by mouth every 12 (twelve) hours. For mood stabilization 60 tablet 0  . LORazepam (ATIVAN) 1 MG tablet Take 1 tablet (1 mg total) by mouth every 6 (six) hours as needed for anxiety. 28 tablet 0  . OLANZapine zydis (ZYPREXA) 10 MG disintegrating tablet Take 1 tablet (10 mg total) by mouth at bedtime. For mood control 30 tablet 0  . Oxcarbazepine (TRILEPTAL) 300 MG tablet Take 1 tablet (300 mg total) by mouth 2 (two) times daily. For mood stabilization 60 tablet 0  . Prenatal w/o A Vit-Fe Fum-FA (PRENATAL VITAMIN W/FE, FA) 29-1 MG CHEW Chew 1 tablet by mouth daily. 360 tablet 0  . senna-docusate  (SENOKOT-S) 8.6-50 MG tablet Take 1 tablet by mouth at bedtime. (May buy from over the counter): For constipation 1 tablet 0  . traZODone (DESYREL) 150 MG tablet Take 1 tablet (150 mg total) by mouth at bedtime. For sleep 30 tablet 0   No current facility-administered medications for this visit.    Musculoskeletal: Denies stiffness, restlessness  Psychiatric Specialty Exam: Review of Systems  Cardiovascular: Negative for chest pain.  Skin: Negative for rash.  Psychiatric/Behavioral: Negative for depression and suicidal ideas.    There were no vitals taken for this visit.There is no height or weight on file to calculate BMI.  General Appearance: casual  Eye Contact:  Speech:  Slow  Volume:  Normal  Mood: fair  Affect:  Congruent  Thought Process:  Goal Directed  Orientation:  Full (Time, Place, and Person)  Thought Content:  Logical  Suicidal Thoughts:  No  Homicidal Thoughts:  No  Memory:  Immediate;   Fair Recent;   Fair  Judgement:  Fair  Insight:  Shallow  Psychomotor Activity:  Normal  Concentration:  fair  Recall:  [place]  Language: Fair  Akathisia:  No  Handed:  Right  AIMS (if indicated):  No abnormal movements  Assets:  Desire for Improvement Housing Social Support  ADL's:  Intact  Cognition: WNL  Sleep:  Fair   Screenings: AUDIT     Admission (Discharged) from OP Visit from 04/17/2020 in [place] [facility] Admission (Discharged) from 08/31/2014 in [place] [facility] Admission (Discharged) from 07/12/2014 in [place] [facility] Admission (Discharged) from 06/13/2014 in [place] [facility] Admission (Discharged) from 05/29/2014 in [place] [facility]  Alcohol Use Disorder Identification Test Final Score (AUDIT)  0  0  0  0  0    GAD-7     Office Visit from 03/19/2017 in [place] [facility]   Total GAD-7 Score  0    PHQ2-9     Office Visit from 02/12/2020 in [place] [facility]  PHQ-2 Total Score  0  0  PHQ-9 Total Score  --  0      Assessment and Plan: as follows Bipolar disorder, mixed or depressed : not paranoid since hospital discharge. Wants to stop trileptal, but agreed to lower to one tab at night and not stop completely, continue zyprexa. No tremors On lithium as well. Feels otherwise better h No tremors.  She will continue Cogentin one a day GAD: manageable, continue on coping skills and distraction Insomnia: manageable, feels drained , stop daytime trileptal, can lower trazadone to half tablet of current dose and stop in next 2-3 nights  I discussed the assessment and treatment plan with the patient. The patient was provided an opportunity to ask questions and all were answered. The patient agreed with the plan and demonstrated an understanding of the instructions.   The patient was advised to call back or seek an in-person evaluation if the symptoms worsen or if the condition fails to improve as anticipated. Fu 3m. meds reviewed  Non face to face time spent: 20 min Merian Capron, MD 6/7/20213:42 PM

## 2020-05-07 ENCOUNTER — Telehealth: Payer: Self-pay | Admitting: *Deleted

## 2020-05-07 NOTE — Telephone Encounter (Signed)
LMOVM for pt to call call back and schedule her (and her husnad) an appt on there same day. Sallie Maker Bruna Potter, CMA

## 2020-05-07 NOTE — Telephone Encounter (Signed)
-----   Message from Westley Chandler, MD sent at 05/02/2020  4:26 PM EDT ----- Regarding: Schedule with husband please Hi Red Team,  Please schedule this patient for follow up with me for hospital stay  in July sometime with her husband ([employer]).   Thanks, [name], MD  [facility] Medicine Teaching Service

## 2020-05-14 ENCOUNTER — Telehealth (HOSPITAL_COMMUNITY): Payer: Self-pay | Admitting: Psychiatry

## 2020-05-14 MED ORDER — LORAZEPAM 0.5 MG PO TABS: 0.5000 mg | ORAL_TABLET | Freq: Every day | ORAL | 0 refills | Status: DC | PRN

## 2020-05-14 NOTE — Telephone Encounter (Signed)
Sent but do not use it more then once a day and only if needed for severe anxiety.  Dose changed to 0.5mg  as its not meant for long term use Continue other meds regularly

## 2020-05-14 NOTE — Telephone Encounter (Signed)
Pt calling asking for refill on ativan.  [facility]

## 2020-05-14 NOTE — Telephone Encounter (Signed)
I informed patient to take one tab only as needed for severe anxiety per Dr. Lynnae Sandhoff. She stated her understanding. Nothing further is needed at this time

## 2020-05-16 ENCOUNTER — Telehealth: Payer: Self-pay

## 2020-05-16 NOTE — Telephone Encounter (Signed)
Patient calls nurse line requesting to speak with PCP. Patient reports concerns muscle soreness in legs and arms. Also reports pain in and feet and ankles. Patient reports onset shortly after being discharged from the hospital in mid-May.   ED/UC precautions given.   To PCP  Veronda Prude, RN

## 2020-05-17 NOTE — Telephone Encounter (Signed)
Called patient back. She reports a number of concerns and questions about medications. All questions answered, scheduled for follow up with husband.  [name], MD  Family Medicine Teaching Service

## 2020-05-20 ENCOUNTER — Telehealth (HOSPITAL_COMMUNITY): Payer: Medicaid Other | Admitting: Psychiatry

## 2020-05-22 ENCOUNTER — Telehealth (HOSPITAL_COMMUNITY): Payer: Self-pay

## 2020-05-22 ENCOUNTER — Telehealth: Payer: Self-pay | Admitting: *Deleted

## 2020-05-22 NOTE — Telephone Encounter (Signed)
Called patient and informed her I could not refill Ativan or other psychiatric medications.  [name], MD  Family Medicine Teaching Service

## 2020-05-22 NOTE — Telephone Encounter (Signed)
Patient called and stated that she would like you to take over her medications that were prescribed by Armandina Stammer, NP. You already prescribe Lorazepam for her and she stated that she would like you to handles the rest of her medications as well. Please review and advise. Thank you.

## 2020-05-22 NOTE — Telephone Encounter (Signed)
Pt calls because she is afraid of running out of her new meds that were started in the hospital.  Pt reports that Dr. Lynnae Sandhoff did not RX them and she is waiting on a callback from him to figure out the next steps.  She wonders if there is anyway that Dr. Manson Passey could help facilitate this request.    Pt is very anxious about running out of medications.  Jone Baseman, CMA

## 2020-05-22 NOTE — Telephone Encounter (Signed)
Patient called back. She needs refills on the medications she got from the hospital

## 2020-05-27 MED ORDER — LITHIUM CARBONATE ER 450 MG PO TBCR: 450.0000 mg | EXTENDED_RELEASE_TABLET | Freq: Two times a day (BID) | ORAL | 0 refills | Status: DC

## 2020-05-27 MED ORDER — OLANZAPINE 10 MG PO TBDP: 10.0000 mg | ORAL_TABLET | Freq: Every day | ORAL | 0 refills | Status: DC

## 2020-05-27 MED ORDER — TRAZODONE HCL 150 MG PO TABS: 150.0000 mg | ORAL_TABLET | Freq: Every day | ORAL | 0 refills | Status: DC

## 2020-05-27 MED ORDER — BENZTROPINE MESYLATE 0.5 MG PO TABS: 0.5000 mg | ORAL_TABLET | Freq: Two times a day (BID) | ORAL | 0 refills | Status: DC | PRN

## 2020-05-27 NOTE — Telephone Encounter (Signed)
Spoke to patient. Shows understanding. Nothing Further Needed at this time.   

## 2020-05-27 NOTE — Telephone Encounter (Signed)
Pt called, states she needs refills on all meds.  She is not taking the trileptal  [facility] [address]

## 2020-05-27 NOTE — Telephone Encounter (Signed)
I have sent olanzapine, lithium, cogentin  Trazadone was supposed to be discontinued as per last note in few days

## 2020-05-27 NOTE — Telephone Encounter (Signed)
As per last note , she and her husband wanted her to be off trileptal it was sedating. Wanted to be on lower trazadone. Not sure what doses is she taking and what doses she wants to be back on? She should look at hospital discharge meds and tell so there is no confusion

## 2020-05-27 NOTE — Telephone Encounter (Signed)
These rx's was sent to the wrong pharmacy.  Please resend them to [facility] [address]. (stated in previous message)   She would like a call back once they are sent so she can go pick them up

## 2020-05-28 MED ORDER — OLANZAPINE 10 MG PO TBDP: 10.0000 mg | ORAL_TABLET | Freq: Every day | ORAL | 0 refills | Status: DC

## 2020-05-28 MED ORDER — LITHIUM CARBONATE ER 450 MG PO TBCR: 450.0000 mg | EXTENDED_RELEASE_TABLET | Freq: Two times a day (BID) | ORAL | 0 refills | Status: DC

## 2020-05-28 MED ORDER — BENZTROPINE MESYLATE 0.5 MG PO TABS: 0.5000 mg | ORAL_TABLET | Freq: Two times a day (BID) | ORAL | 0 refills | Status: DC | PRN

## 2020-05-28 NOTE — Telephone Encounter (Signed)
Resent meds to [facility] on [address]. I informed patient

## 2020-05-28 NOTE — Addendum Note (Signed)
Addended by: Azalia Bilis on: 05/28/2020 10:55 AM   Modules accepted: Orders

## 2020-05-28 NOTE — Telephone Encounter (Signed)
Patient called back this morning. She needs the rx's sent to [facility] on [address] in [place]

## 2020-05-28 NOTE — Telephone Encounter (Signed)
Patient called back again. She needs the rx's sent to [facility] on [address] in [place]  Informed her dr is with patients. She states it is urgent.

## 2020-06-04 ENCOUNTER — Other Ambulatory Visit (HOSPITAL_COMMUNITY): Payer: Self-pay

## 2020-06-04 MED ORDER — OXCARBAZEPINE 300 MG PO TABS: 300.0000 mg | ORAL_TABLET | Freq: Every day | ORAL | 0 refills | Status: DC

## 2020-06-06 ENCOUNTER — Encounter: Payer: Self-pay | Admitting: Family Medicine

## 2020-06-06 ENCOUNTER — Encounter (HOSPITAL_COMMUNITY): Payer: Self-pay | Admitting: Psychiatry

## 2020-06-06 ENCOUNTER — Telehealth: Payer: Self-pay | Admitting: Family Medicine

## 2020-06-06 ENCOUNTER — Telehealth (INDEPENDENT_AMBULATORY_CARE_PROVIDER_SITE_OTHER): Payer: Medicaid Other | Admitting: Psychiatry

## 2020-06-06 DIAGNOSIS — F5102 Adjustment insomnia: Secondary | ICD-10-CM

## 2020-06-06 DIAGNOSIS — R200 Anesthesia of skin: Secondary | ICD-10-CM

## 2020-06-06 DIAGNOSIS — F411 Generalized anxiety disorder: Secondary | ICD-10-CM | POA: Diagnosis not present

## 2020-06-06 DIAGNOSIS — F312 Bipolar disorder, current episode manic severe with psychotic features: Secondary | ICD-10-CM

## 2020-06-06 MED ORDER — OLANZAPINE 15 MG PO TABS
15.0000 mg | ORAL_TABLET | Freq: Every day | ORAL | 0 refills | Status: DC
Start: 2020-06-06 — End: 2020-07-01

## 2020-06-06 NOTE — Telephone Encounter (Signed)
Patient regarding multiple questions.  The patient reports she has not had a cycle in 2 months.  She endorses some changes in her mood.  Recommended she take a pregnancy test.  She is to call with results.  Recommended she start taking a prenatal as she is not using any contraception.  We will continue this to discuss contraceptive options at her upcoming visit this month.  Patient reports bilateral hand numbness left greater than right.  Previously thought to be carpal tunnel.  We discussed at length.  She denies swelling or redness of the joints.  She endorses some weakness at times.  This is been previously assessed.  Recommended wearing splints only at night she is intermittently wearing them during the day will obtain nerve conduction studies.  All questions answered I really to like him I really 0 [name], MD  [facility] Medicine Teaching Service

## 2020-06-06 NOTE — Telephone Encounter (Signed)
Spouse is requesting a call because pt has hand weakness in both hands; Can she be referred to a specialist. Spouse ph # [phone]  Pt # [phone]

## 2020-06-06 NOTE — Progress Notes (Signed)
[facility] Follow up    Patient Identification: Natalie [name] MRN:  [id number] Date of Evaluation:  06/06/2020 Referral Source: primary care Chief Complaint:   Bipolar follow up        I connected with [name] on 06/06/20 at 10:00 AM EDT by a video enabled telemedicine application and verified that I am speaking with the correct person using two identifiers.    I discussed the limitations, risks, security and privacy concerns of performing an evaluation and management service by telephone and the availability of in person appointments. I also discussed with the patient that there may be a patient responsible charge related to this service. The patient expressed understanding and agreed to proceed. Patient location : home Provider location: home   Visit Diagnosis: Bipolar, GAD, insomnia No diagnosis found.  History of Present Illness: 29 year old currently married African-American female referred by primary care physician for management of bipolar disorder.  History of Fanapt use but was not covered, med was changed due to prolactin concern   She feels sedate during the day wants to avoid trileptal, husband was there and says she doesn't sleep during the night, zyprexa is sublingual its not effective Lithium level 54.  Feels subdued, sleep effects mood and feels tired during the day    Modifying factors; disability income,husband Aggravating factor mental health, past   Non compliance history. multiple medication or antipsychotics that she has tried  Duration 10 years plus Severity some better since hospital discharge Husband is supportive Denies tremors  Past Psychiatric History: bipolar    Past Medical History:  Past Medical History:  Diagnosis Date  . Anxiety   . Chronic bipolar disorder (HCC)   . Depression   . H/O suicide attempt 05-2013   OD  . Obesity   . OCD (obsessive compulsive disorder)   . Psychiatric diagnosis    No past surgical history on  file.  Family Psychiatric History: father : bipolar has been on seroquel  Family History:  Family History  Problem Relation Age of Onset  . Hypertension Mother   . Depression Father   . Hypertension Father   . Bipolar disorder Father   . Hypertension Maternal Grandmother   . Obesity Maternal Grandmother   . Hypertension Paternal Grandmother     Social History:   Social History   Socioeconomic History  . Marital status: Married    Spouse name: Not on file  . Number of children: Not on file  . Years of education: Not on file  . Highest education level: Not on file  Occupational History  . Not on file  Tobacco Use  . Smoking status: Never Smoker  . Smokeless tobacco: Never Used  Vaping Use  . Vaping Use: Never used  Substance and Sexual Activity  . Alcohol use: No  . Drug use: No  . Sexual activity: Yes    Birth control/protection: I.U.D.  Other Topics Concern  . Not on file  Social History Narrative   Husband is [employer], dances, and sings for fun    Social Determinants of Health   Financial Resource Strain:   . Difficulty of Paying Living Expenses:   Food Insecurity:   . Worried About Programme researcher, broadcasting/film/video in the Last Year:   . Barista in the Last Year:   Transportation Needs:   . Freight forwarder (Medical):   Marland Kitchen Lack of Transportation (Non-Medical):   Physical Activity:   . Days of  Exercise per Week:   . Minutes of Exercise per Session:   Stress:   . Feeling of Stress :   Social Connections:   . Frequency of Communication with Friends and Family:   . Frequency of Social Gatherings with Friends and Family:   . Attends Religious Services:   . Active Member of Clubs or Organizations:   . Attends Banker Meetings:   Marland Kitchen Marital Status:      Allergies:  No Known Allergies  Metabolic Disorder Labs: Lab Results  Component Value Date   HGBA1C 5.5 03/11/2020   MPG 100 09/03/2014   Lab Results  Component  Value Date   PROLACTIN 34.1 (H) 03/11/2020   Lab Results  Component Value Date   CHOL 141 04/21/2020   TRIG 54 04/21/2020   HDL 42 04/21/2020   CHOLHDL 3.4 04/21/2020   VLDL 11 04/21/2020   LDLCALC 88 04/21/2020   LDLCALC 149 (H) 10/23/2019   Lab Results  Component Value Date   TSH 0.568 04/30/2020    Therapeutic Level Labs: Lab Results  Component Value Date   LITHIUM 0.57 (L) 04/30/2020   Lab Results  Component Value Date   CBMZ 3.7 (L) 10/20/2014   Lab Results  Component Value Date   VALPROATE 79.2 07/13/2014    Current Medications: Current Outpatient Medications  Medication Sig Dispense Refill  . benztropine (COGENTIN) 0.5 MG tablet Take 1 tablet (0.5 mg total) by mouth 2 (two) times daily as needed for tremors. 60 tablet 0  . docusate sodium (COLACE) 100 MG capsule Take 1 capsule (100 mg total) by mouth 2 (two) times daily. For constipation 60 capsule 0  . lithium carbonate (ESKALITH) 450 MG CR tablet Take 1 tablet (450 mg total) by mouth every 12 (twelve) hours. For mood stabilization 60 tablet 0  . LORazepam (ATIVAN) 0.5 MG tablet Take 1 tablet (0.5 mg total) by mouth daily as needed for anxiety. 30 tablet 0  . OLANZapine (ZYPREXA) 15 MG tablet Take 1 tablet (15 mg total) by mouth at bedtime. 30 tablet 0  . Oxcarbazepine (TRILEPTAL) 300 MG tablet Take 1 tablet (300 mg total) by mouth daily. For mood stabilization 30 tablet 0  . Prenatal w/o A Vit-Fe Fum-FA (PRENATAL VITAMIN W/FE, FA) 29-1 MG CHEW Chew 1 tablet by mouth daily. 360 tablet 0  . senna-docusate (SENOKOT-S) 8.6-50 MG tablet Take 1 tablet by mouth at bedtime. (May buy from over the counter): For constipation 1 tablet 0   No current facility-administered medications for this visit.    Musculoskeletal: Denies stiffness, restlessness  Psychiatric Specialty Exam: Review of Systems  Cardiovascular: Negative for chest pain.  Skin: Negative for rash.  Psychiatric/Behavioral: Negative for suicidal ideas.  The patient has insomnia.     There were no vitals taken for this visit.There is no height or weight on file to calculate BMI.  General Appearance: casual  Eye Contact:   Speech:  Slow  Volume:  Normal  Mood: subdued  Affect:  Congruent  Thought Process:  Goal Directed  Orientation:  Full (Time, Place, and Person)  Thought Content:  Logical  Suicidal Thoughts:  No  Homicidal Thoughts:  No  Memory:  Immediate;   Fair Recent;   Fair  Judgement:  Fair  Insight:  Shallow  Psychomotor Activity:  Normal  Concentration:  fair  Recall:  [employer] of Knowledge:Fair  Language: Fair  Akathisia:  No  Handed:  Right  AIMS (if indicated):  No abnormal movements  Assets:  Desire for Improvement Housing Social Support  ADL's:  Intact  Cognition: WNL  Sleep:  Fair   Screenings: AUDIT     [facility] (Dis[facility]  Alcohol Use Disorder Identification Test Final Score (AUDIT) 0 0 0 0 0    GAD-7     [facility] 03/19/2017 in [facility]  Total GAD-7 Score 0    PHQ2-9     Office Visit from 02/12/2020 in [facility] Office Visit from 03/19/2017 in [facility]  PHQ-2 Total Score 0 0  PHQ-9 Total Score -- 0      Assessment and Plan: as follows Bipolar disorder, mixed or depressed : subdued, change zydis to zyprexa tablet 15mg . Continue trileptal at night only Avoid sleeping during the day On lithium as well. No tremors.  She will continue Cogentin one a day, at times takes bid GAD:  fluctuates, continue on coping skills Insomnia: takes naps during day, discussed to avoid. Stop trileptal during the day, change zydis to zyprexa At night. Not on trazadone, should discontinue Husband agrees to plan and to keep her engaged during the days, avoid naps  I discussed the assessment and treatment plan with the patient. The patient was provided an opportunity to ask questions and all were answered. The patient agreed with the plan and demonstrated an understanding of the instructions.   The patient was advised to call back or seek an in-person evaluation if the symptoms worsen or if the condition fails to improve as anticipated. Fu 82m. meds reviewed  Non face to face time spent: 20 min 0m, MD 7/8/202110:16 AM

## 2020-06-10 ENCOUNTER — Telehealth (HOSPITAL_COMMUNITY): Payer: Self-pay | Admitting: Psychiatry

## 2020-06-10 ENCOUNTER — Other Ambulatory Visit (HOSPITAL_COMMUNITY): Payer: Self-pay | Admitting: Psychiatry

## 2020-06-10 ENCOUNTER — Encounter: Payer: Self-pay | Admitting: Physical Medicine & Rehabilitation

## 2020-06-10 NOTE — Telephone Encounter (Signed)
Patient called back after speaking with her previously regarding the trileptal.  Patient wants to talk Dr. Gilmore Laroche. She does not agree with what I am telling her.   Please call her at [phone]. Or I can make her an appointment

## 2020-06-10 NOTE — Telephone Encounter (Signed)
I already had a discussion of this last visit and understands the importance of medications. She can stop trileptal if she wants to

## 2020-06-10 NOTE — Telephone Encounter (Signed)
Pt states she will not take any more trileptal  Nothing Further Needed at this time.

## 2020-06-10 NOTE — Telephone Encounter (Signed)
Pt called again  Needs refill on ativan  [facility]   cb [phone]

## 2020-06-10 NOTE — Telephone Encounter (Signed)
Pt states the zyprexa is working. However the trileptal is keeping her awake at night. She wants to come off of it. She did not sleep at all last night.   Please advise.   CB [phone]

## 2020-06-10 NOTE — Telephone Encounter (Signed)
Informed patient of the following. Shows understanding. Nothing Further Needed at this time.   

## 2020-06-10 NOTE — Telephone Encounter (Signed)
She can take it during the morning or afternoon not at night then.

## 2020-06-11 NOTE — Telephone Encounter (Signed)
Spoke to patient. Informed her rx was sent.  Nothing Further Needed at this time.

## 2020-06-11 NOTE — Telephone Encounter (Signed)
sent 

## 2020-06-13 ENCOUNTER — Telehealth: Payer: Self-pay

## 2020-06-13 NOTE — Telephone Encounter (Signed)
Agree with below.   Hether Anselmo, MD  Family Medicine Teaching Service   

## 2020-06-13 NOTE — Telephone Encounter (Signed)
Patient calls nurse line reporting heavy menstrual bleeding. Patient reports her period "finally" started last night after not "appearing" for a "while." Patient denies cramping, just heavy bleeding. Patient is concerned that not a lot of blood is coming onto the pad, however when she uses the bathroom and wipes she sees "a lot" of blood. Patient is concerned the blood is "staying inside her." Patient does not report using multiple pads to control bleeding. Patient has an upcoming apt with PCP on 7/23 that she plans to keep. Patient advised to start a period diary between now and then. Patient agreed with plan. Patient informed to call the office if she begins to use more than one pad in less than a 2 hour period.

## 2020-06-14 ENCOUNTER — Ambulatory Visit: Payer: Self-pay | Admitting: Family Medicine

## 2020-06-17 NOTE — Telephone Encounter (Signed)
Patient calls nurse line this morning reporting a decrease in her menstrual bleeding. Patient is happy about this, as this period has been "normal." Patient wants to know if she will continue to have normal periods from now on each month. I advised patient there is no way to know and we will have to wait and see how the next periods go. Patient also reporting she started lithium again and her appetite has been effected. I advised patient to keep her apt on Friday with PCP to discuss further.

## 2020-06-17 NOTE — Telephone Encounter (Signed)
Agree with below. Addendum:    I believe that she has limits with moving and including, toileting, bathing, feeding, dressing and grooming. I believe the power wheelchair is needed for pt to be able to perform ADL's in her home

## 2020-06-21 ENCOUNTER — Ambulatory Visit (INDEPENDENT_AMBULATORY_CARE_PROVIDER_SITE_OTHER): Payer: Medicaid Other | Admitting: Family Medicine

## 2020-06-21 ENCOUNTER — Encounter: Payer: Self-pay | Admitting: Family Medicine

## 2020-06-21 ENCOUNTER — Other Ambulatory Visit: Payer: Self-pay

## 2020-06-21 VITALS — BP 102/72 | HR 71 | Ht 64.0 in | Wt 195.4 lb

## 2020-06-21 DIAGNOSIS — Z1159 Encounter for screening for other viral diseases: Secondary | ICD-10-CM | POA: Diagnosis present

## 2020-06-21 DIAGNOSIS — R200 Anesthesia of skin: Secondary | ICD-10-CM | POA: Diagnosis not present

## 2020-06-21 DIAGNOSIS — F430 Acute stress reaction: Secondary | ICD-10-CM | POA: Insufficient documentation

## 2020-06-21 DIAGNOSIS — Z30013 Encounter for initial prescription of injectable contraceptive: Secondary | ICD-10-CM | POA: Diagnosis not present

## 2020-06-21 DIAGNOSIS — F3181 Bipolar II disorder: Secondary | ICD-10-CM | POA: Diagnosis not present

## 2020-06-21 LAB — POCT URINE PREGNANCY: Preg Test, Ur: NEGATIVE

## 2020-06-21 MED ORDER — MEDROXYPROGESTERONE ACETATE 150 MG/ML IM SUSY
150.0000 mg | PREFILLED_SYRINGE | Freq: Once | INTRAMUSCULAR | Status: AC
  Administered 2020-06-21: 150 mg via INTRAMUSCULAR

## 2020-06-21 NOTE — Assessment & Plan Note (Signed)
Recommended close follow-up with psychiatry.  She is seeing a therapist next week.  Will check kidney function and thyroid she recently restarted lithium.

## 2020-06-21 NOTE — Patient Instructions (Addendum)
It was wonderful to see you today.  Please bring ALL of your medications with you to every visit.   Today we talked about:  - Birth control--you will be due to for Depo October  - Check blood work for your hands    Thank you for choosing [facility].   Please call [phone] with any questions about today's appointment.  Please be sure to schedule follow up at the front  desk before you leave today.   [name], MD  Family Medicine

## 2020-06-21 NOTE — Assessment & Plan Note (Signed)
Discussed side effects at length with patient she desires weight gain which we discussed this was not necessarily recommended given her BMI.  Discussed side effects of Depo-Provera.  She would like to restart this.  She reports she does not want another Mirena.  All questions were answered pregnancy test negative LMP July 14.  She has not had intercourse for over a month.

## 2020-06-21 NOTE — Assessment & Plan Note (Signed)
Patient reports marital strain.  Supportive listening provided.  Resources given for women shelter as well as related Justice.  Encouraged her to talk with her husband and her family and proceed with next best steps to gain independence.  Close follow-up arranged for patient individually.

## 2020-06-21 NOTE — Progress Notes (Signed)
    SUBJECTIVE:   CHIEF COMPLAINT / HPI:  Natalie [name] is a pleasant 29 year old woman with history significant for bipolar 1 disorder and obesity presenting today for routine follow-up.  She reports overall she is doing okay.  She reports several stressors in her life.   The patient reports that her marriage is undergoing some stress.  She does not feel supported in her relationship.  She is in first interview with her husband with whom she shares this information.  He is aware of this information and knows that the patient would like the separation at this time.  When interviewed independently the patient reports she feels safe at home. Denies sexual or physical abuse.  She does feel he is controlling.  She has her own cell phone.  She reports her mood is okay she denies thoughts of hurting herself or others.  The patient is called in several times about home pregnancy test.  She is unsure when her periods started.  She has Mirena for many years, during which menses were regular.  Since the Mirena was removed she has had to moderate really heavy menstrual cycle each month.  She would like to reconsider birth control.  Last period was July 14.  She has not had intercourse since that time.  She is most interested in Depo-Provera and desires weight gain.  PERTINENT  PMH / PSH/Family/Social History : Medical surgical and social history updated and reviewed.  Reviewed recent admission for bipolar disorder.  Updated medication list with patient.  OBJECTIVE:   BP 102/72   Pulse 71   Ht 5\' 4"  (1.626 m)   Wt 195 lb 6.4 oz (88.6 kg)   SpO2 98%   BMI 33.54 kg/m   HEENT: Sclera anicteric. Dentition is moderate. Appears well hydrated. Neck: Supple Cardiac: Regular rate and rhythm. Normal S1/S2. No murmurs, rubs, or gallops appreciated. Lungs: Clear bilaterally to ascultation.     ASSESSMENT/PLAN:   Bipolar 2 disorder (HCC) Recommended close follow-up with psychiatry.  She is seeing a  therapist next week.  Will check kidney function and thyroid she recently restarted lithium.  Encounter for initial prescription of injectable contraceptive Discussed side effects at length with patient she desires weight gain which we discussed this was not necessarily recommended given her BMI.  Discussed side effects of Depo-Provera.  She would like to restart this.  She reports she does not want another Mirena.  All questions were answered pregnancy test negative LMP July 14.  She has not had intercourse for over a month.  Acute stress reaction Patient reports marital strain.  Supportive listening provided.  Resources given for women shelter as well as July 16.  Encouraged her to talk with her husband and her family and proceed with next best steps to gain independence.  Close follow-up arranged for patient individually.   Obesity we discussed a healthy weight gain and I recommended against striving for higher weight goal.  We discussed feeling healthy and strong in our bodies at a healthy BMI.  HCM Hep C screening Declined COVID vaccine    Educational psychologist, MD  Family Medicine Teaching Service  [facility]

## 2020-06-22 LAB — COMPREHENSIVE METABOLIC PANEL
ALT: 6 IU/L (ref 0–32)
AST: 12 IU/L (ref 0–40)
Albumin/Globulin Ratio: 1.1 — ABNORMAL LOW (ref 1.2–2.2)
Albumin: 4.1 g/dL (ref 3.9–5.0)
Alkaline Phosphatase: 96 IU/L (ref 48–121)
BUN/Creatinine Ratio: 9 (ref 9–23)
BUN: 5 mg/dL — ABNORMAL LOW (ref 6–20)
Bilirubin Total: 0.4 mg/dL (ref 0.0–1.2)
CO2: 18 mmol/L — ABNORMAL LOW (ref 20–29)
Calcium: 9.9 mg/dL (ref 8.7–10.2)
Chloride: 106 mmol/L (ref 96–106)
Creatinine, Ser: 0.55 mg/dL — ABNORMAL LOW (ref 0.57–1.00)
GFR calc Af Amer: 147 mL/min/{1.73_m2} (ref >59)
GFR calc non Af Amer: 127 mL/min/{1.73_m2} (ref >59)
Globulin, Total: 3.9 g/dL (ref 1.5–4.5)
Glucose: 94 mg/dL (ref 65–99)
Potassium: 4.3 mmol/L (ref 3.5–5.2)
Sodium: 142 mmol/L (ref 134–144)
Total Protein: 8 g/dL (ref 6.0–8.5)

## 2020-06-22 LAB — TSH: TSH: 0.418 u[IU]/mL — ABNORMAL LOW (ref 0.450–4.500)

## 2020-06-22 LAB — HCG, SERUM, QUALITATIVE: hCG,Beta Subunit,Qual,Serum: NEGATIVE m[IU]/mL (ref <6)

## 2020-06-22 LAB — HCV AB W REFLEX TO QUANT PCR: HCV Ab: <0.1 s/co ratio (ref 0.0–0.9)

## 2020-06-22 LAB — HCV INTERPRETATION

## 2020-06-24 ENCOUNTER — Encounter: Payer: Medicaid Other | Admitting: Physical Medicine & Rehabilitation

## 2020-06-24 ENCOUNTER — Telehealth: Payer: Self-pay | Admitting: Family Medicine

## 2020-06-24 NOTE — Telephone Encounter (Signed)
Called with results. Having constipation, recommended picking up Colace/Miralax.  TSH suppressed (slightly), repeat with T4/T3 at follow up.  [name], MD  Family Medicine Teaching Service

## 2020-06-30 ENCOUNTER — Encounter (HOSPITAL_COMMUNITY): Payer: Self-pay | Admitting: Emergency Medicine

## 2020-06-30 ENCOUNTER — Emergency Department (HOSPITAL_COMMUNITY)
Admission: EM | Admit: 2020-06-30 | Discharge: 2020-07-03 | Disposition: A | Discharge status: Other Acute Inpatient Hospital | Payer: Medicaid Other | Attending: Emergency Medicine | Admitting: Emergency Medicine

## 2020-06-30 ENCOUNTER — Other Ambulatory Visit: Payer: Self-pay

## 2020-06-30 DIAGNOSIS — Z01818 Encounter for other preprocedural examination: Secondary | ICD-10-CM | POA: Diagnosis not present

## 2020-06-30 DIAGNOSIS — F22 Delusional disorders: Secondary | ICD-10-CM

## 2020-06-30 DIAGNOSIS — F600 Paranoid personality disorder: Secondary | ICD-10-CM | POA: Diagnosis not present

## 2020-06-30 DIAGNOSIS — Z20822 Contact with and (suspected) exposure to covid-19: Secondary | ICD-10-CM | POA: Insufficient documentation

## 2020-06-30 DIAGNOSIS — F319 Bipolar disorder, unspecified: Secondary | ICD-10-CM | POA: Diagnosis present

## 2020-06-30 LAB — COMPREHENSIVE METABOLIC PANEL
ALT: 9 U/L (ref 0–44)
AST: 16 U/L (ref 15–41)
Albumin: 3.7 g/dL (ref 3.5–5.0)
Alkaline Phosphatase: 75 U/L (ref 38–126)
Anion gap: 10 (ref 5–15)
BUN: <5 mg/dL — ABNORMAL LOW (ref 6–20)
CO2: 18 mmol/L — ABNORMAL LOW (ref 22–32)
Calcium: 9.1 mg/dL (ref 8.9–10.3)
Chloride: 111 mmol/L (ref 98–111)
Creatinine, Ser: 0.6 mg/dL (ref 0.44–1.00)
GFR calc Af Amer: >60 mL/min (ref >60)
GFR calc non Af Amer: >60 mL/min (ref >60)
Glucose, Bld: 98 mg/dL (ref 70–99)
Potassium: 4.1 mmol/L (ref 3.5–5.1)
Sodium: 139 mmol/L (ref 135–145)
Total Bilirubin: 1.2 mg/dL (ref 0.3–1.2)
Total Protein: 8.1 g/dL (ref 6.5–8.1)

## 2020-06-30 LAB — CBC
HCT: 38.3 % (ref 36.0–46.0)
Hemoglobin: 11.5 g/dL — ABNORMAL LOW (ref 12.0–15.0)
MCH: 28.5 pg (ref 26.0–34.0)
MCHC: 30 g/dL (ref 30.0–36.0)
MCV: 94.8 fL (ref 80.0–100.0)
Platelets: 274 10*3/uL (ref 150–400)
RBC: 4.04 MIL/uL (ref 3.87–5.11)
RDW: 15.9 % — ABNORMAL HIGH (ref 11.5–15.5)
WBC: 10.3 10*3/uL (ref 4.0–10.5)
nRBC: 0 % (ref 0.0–0.2)

## 2020-06-30 LAB — ACETAMINOPHEN LEVEL: Acetaminophen (Tylenol), Serum: <10 ug/mL — ABNORMAL LOW (ref 10–30)

## 2020-06-30 LAB — LITHIUM LEVEL: Lithium Lvl: 0.18 mmol/L — ABNORMAL LOW (ref 0.60–1.20)

## 2020-06-30 LAB — ETHANOL: Alcohol, Ethyl (B): <10 mg/dL (ref <10)

## 2020-06-30 LAB — I-STAT BETA HCG BLOOD, ED (MC, WL, AP ONLY): I-stat hCG, quantitative: <5 m[IU]/mL (ref <5)

## 2020-06-30 LAB — SARS CORONAVIRUS 2 BY RT PCR (HOSPITAL ORDER, PERFORMED IN ~~LOC~~ HOSPITAL LAB): SARS Coronavirus 2: NEGATIVE

## 2020-06-30 LAB — SALICYLATE LEVEL: Salicylate Lvl: <7 mg/dL — ABNORMAL LOW (ref 7.0–30.0)

## 2020-06-30 MED ORDER — ACETAMINOPHEN 325 MG PO TABS: 650.0000 mg | ORAL_TABLET | ORAL | Status: DC | PRN

## 2020-06-30 MED ORDER — ONDANSETRON HCL 4 MG PO TABS: 4.0000 mg | ORAL_TABLET | Freq: Three times a day (TID) | ORAL | Status: DC | PRN

## 2020-06-30 MED ORDER — BENZTROPINE MESYLATE 1 MG PO TABS: 0.5000 mg | ORAL_TABLET | Freq: Two times a day (BID) | ORAL | Status: DC | PRN

## 2020-06-30 MED ORDER — OLANZAPINE 5 MG PO TABS
15.0000 mg | ORAL_TABLET | Freq: Every day | ORAL | Status: DC
  Administered 2020-06-30 – 2020-07-02 (×2): 15 mg via ORAL
  Filled 2020-06-30: qty 1
  Filled 2020-06-30 (×3): qty 3

## 2020-06-30 MED ORDER — LITHIUM CARBONATE ER 450 MG PO TBCR
450.0000 mg | EXTENDED_RELEASE_TABLET | Freq: Two times a day (BID) | ORAL | Status: DC
  Administered 2020-06-30 – 2020-07-03 (×6): 450 mg via ORAL
  Filled 2020-06-30 (×8): qty 1

## 2020-06-30 NOTE — ED Triage Notes (Signed)
Pt brought to ED by [employer] from home IVC by husband for increase aggressive behavior, no taking her medications. Pt denies any SI or HI.

## 2020-06-30 NOTE — ED Notes (Signed)
[phone] Natalie [name]

## 2020-06-30 NOTE — ED Notes (Signed)
Called staffing, no sitters available at this time.  

## 2020-06-30 NOTE — ED Notes (Signed)
Pt arrived to Rm [facility] - wearing burgundy scrubs - Sitter w/pt. Pt aware of need for urine specimen.

## 2020-06-30 NOTE — ED Notes (Signed)
Belongings inventoried - 2 labeled belongings bag and 1 pink bag placed in [place] # 3 - Valuables Envelope - Security - $142, Debit Card, and Social Security cards x 2 - Pt signed verifying inventory and pt verified NO CELL PHONE brought to ED.

## 2020-06-30 NOTE — ED Notes (Signed)
Patient denies pain and is resting comfortably.  

## 2020-06-30 NOTE — ED Provider Notes (Signed)
[facility] EMERGENCY DEPARTMENT Provider Note   CSN: [id number] Arrival date & time: 06/30/20  1152   History Chief Complaint  Patient presents with  . Medical Clearance    IVC    Natalie [name] is a 29 y.o. female with hx of bipolar d/o who presents under IVC. She states that her family and her husband are against her and they are changing her medicines. She states that she is on Zyprexa, Cogentin, and Lithium which she feels like is helpful for her. She states she threw them all away today because she was angry and was in an argument with her husband. Her husband then told her he was leaving and was going to go to the magistrate to get IVC paperwork. She adamantly denies any SI or HI. She is agreeable to talking to psych. She states that no matter what medicines she gets put on they are going to be changed by her family because they want control of her.  Discussed with her husband: SHASTA CHINN who filed IVC papers: He states that she threw her medicines away this morning. He has been asking her to get help but she wouldn't do it voluntarily so he went to get IVC papers. She has been making comments to him that she wants to go to [place]. She hasn't made any statements that she was going to hurt herself or other but he feels that her throwing away her meds is her hurting herself.   HPI     Past Medical History:  Diagnosis Date  . Anxiety   . Chronic bipolar disorder (HCC)   . Depression   . H/O suicide attempt 05-2013   OD  . Obesity   . OCD (obsessive compulsive disorder)   . Psychiatric diagnosis     Patient Active Problem List   Diagnosis Date Noted  . Encounter for initial prescription of injectable contraceptive 06/21/2020  . Acute stress reaction 06/21/2020  . Morbid obesity (HCC) 10/23/2019  . Bipolar 2 disorder (HCC) 08/31/2014  . OCD (obsessive compulsive disorder)     History reviewed. No pertinent surgical history.   OB History    Gravida    0   Para  0   Term  0   Preterm  0   AB  0   Living  0     SAB  0   TAB  0   Ectopic  0   Multiple  0   Live Births              Family History  Problem Relation Age of Onset  . Hypertension Mother   . Depression Father   . Hypertension Father   . Bipolar disorder Father   . Hypertension Maternal Grandmother   . Obesity Maternal Grandmother   . Hypertension Paternal Grandmother     Social History   Tobacco Use  . Smoking status: Never Smoker  . Smokeless tobacco: Never Used  Vaping Use  . Vaping Use: Never used  Substance Use Topics  . Alcohol use: No  . Drug use: No    Home Medications Prior to Admission medications   Medication Sig Start Date End Date Taking? Authorizing Provider  benztropine (COGENTIN) 0.5 MG tablet Take 1 tablet (0.5 mg total) by mouth 2 (two) times daily as needed for tremors. 05/28/20   Thresa Ross, MD  docusate sodium (COLACE) 100 MG capsule Take 1 capsule (100 mg total) by mouth 2 (two) times daily. For constipation 05/02/20  Armandina Stammer I, NP  lithium carbonate (ESKALITH) 450 MG CR tablet Take 1 tablet (450 mg total) by mouth every 12 (twelve) hours. For mood stabilization 05/28/20   Thresa Ross, MD  LORazepam (ATIVAN) 0.5 MG tablet TAKE 1 TABLET(0.5 MG) BY MOUTH DAILY AS NEEDED FOR ANXIETY 06/11/20   Thresa Ross, MD  OLANZapine (ZYPREXA) 15 MG tablet Take 1 tablet (15 mg total) by mouth at bedtime. 06/06/20 06/06/21  Thresa Ross, MD  Oxcarbazepine (TRILEPTAL) 300 MG tablet Take 1 tablet (300 mg total) by mouth daily. For mood stabilization Patient not taking: Reported on 06/21/2020 06/04/20   Thresa Ross, MD  Prenatal w/o A Vit-Fe Fum-FA (PRENATAL VITAMIN W/FE, FA) 29-1 MG CHEW Chew 1 tablet by mouth daily. 02/29/20 02/23/21  Marthenia Rolling, DO  senna-docusate (SENOKOT-S) 8.6-50 MG tablet Take 1 tablet by mouth at bedtime. (May buy from over the counter): For constipation 05/02/20   Armandina Stammer I, NP  Paliperidone Palmitate  (INVEGA SUSTENNA IM) Inject 1 each into the muscle every 30 (thirty) days.  04/19/19  [provider]  traZODone (DESYREL) 150 MG tablet Take 1 tablet (150 mg total) by mouth at bedtime. For sleep 05/27/20 06/06/20  Thresa Ross, MD    Allergies    Patient has no known allergies.  Review of Systems   Review of Systems  Constitutional: Negative for fever.  Respiratory: Negative for shortness of breath.   Cardiovascular: Negative for chest pain.  Gastrointestinal: Negative for abdominal pain.  Psychiatric/Behavioral: Positive for behavioral problems and dysphoric mood. Negative for sleep disturbance and suicidal ideas.  All other systems reviewed and are negative.   Physical Exam Updated Vital Signs BP 117/73 (BP Location: Right Arm)   Pulse 87   Temp 99.1 F (37.3 C) (Oral)   Resp 16   Ht 5\' 4"  (1.626 m)   Wt 88.6 kg   SpO2 100%   BMI 33.53 kg/m   Physical Exam Vitals and nursing note reviewed.  Constitutional:      General: She is not in acute distress.    Appearance: Normal appearance. She is well-developed. She is not ill-appearing.     Comments: Calm, cooperative. NAD. Somewhat paranoid  HENT:     Head: Normocephalic and atraumatic.  Eyes:     General: No scleral icterus.       Right eye: No discharge.        Left eye: No discharge.     Conjunctiva/sclera: Conjunctivae normal.     Pupils: Pupils are equal, round, and reactive to light.  Cardiovascular:     Rate and Rhythm: Normal rate.  Pulmonary:     Effort: Pulmonary effort is normal. No respiratory distress.  Abdominal:     General: There is no distension.  Musculoskeletal:     Cervical back: Normal range of motion.  Skin:    General: Skin is warm and dry.  Neurological:     Mental Status: She is alert and oriented to person, place, and time.  Psychiatric:        Attention and Perception: Attention normal.        Mood and Affect: Mood normal.        Speech: Speech normal.        Behavior:  Behavior normal. Behavior is cooperative.        Thought Content: Thought content is paranoid. Thought content does not include homicidal or suicidal ideation. Thought content does not include homicidal or suicidal plan.     ED Results /  Procedures / Treatments   Labs (all labs ordered are listed, but only abnormal results are displayed) Labs Reviewed  COMPREHENSIVE METABOLIC PANEL - Abnormal; Notable for the following components:      Result Value   CO2 18 (*)    BUN <5 (*)    All other components within normal limits  SALICYLATE LEVEL - Abnormal; Notable for the following components:   Salicylate Lvl <7.0 (*)    All other components within normal limits  ACETAMINOPHEN LEVEL - Abnormal; Notable for the following components:   Acetaminophen (Tylenol), Serum <10 (*)    All other components within normal limits  CBC - Abnormal; Notable for the following components:   Hemoglobin 11.5 (*)    RDW 15.9 (*)    All other components within normal limits  LITHIUM LEVEL - Abnormal; Notable for the following components:   Lithium Lvl 0.18 (*)    All other components within normal limits  SARS CORONAVIRUS 2 BY RT PCR (HOSPITAL ORDER, PERFORMED IN [facility])  ETHANOL  RAPID URINE DRUG SCREEN, HOSP PERFORMED  URINALYSIS, ROUTINE W REFLEX MICROSCOPIC  I-STAT BETA HCG BLOOD, ED ([facility], [facility], [facility] ONLY)  I-STAT BETA HCG BLOOD, ED ([facility], [facility], [facility] ONLY)  I-STAT BETA HCG BLOOD, ED ([facility], [facility], [facility] ONLY)    EKG None  Radiology No results found.  Procedures Procedures (including critical care time)  Medications Ordered in ED Medications  acetaminophen (TYLENOL) tablet 650 mg (has no administration in time range)  ondansetron (ZOFRAN) tablet 4 mg (has no administration in time range)  benztropine (COGENTIN) tablet 0.5 mg (has no administration in time range)  lithium carbonate (ESKALITH) CR tablet 450 mg (450 mg Oral Given 06/30/20 1542)  OLANZapine (ZYPREXA) tablet 15 mg (has no  administration in time range)    ED Course  I have reviewed the triage vital signs and the nursing notes.  Pertinent labs & imaging results that were available during my care of the patient were reviewed by me and considered in my medical decision making (see chart for details).  29 year old female presents under IVC with increasing paranoia and throwing away meds today. Her vitals are normal. She is calm and cooperative at this time although gives vague details of why she is here today. She seems to put all of the blame on her parents and husband for why she is here. Shared visit with Dr. Myrtis Ser. Medical clearance labs were obtained and are overall reassuring. Lithium levels are low. She is medically cleared and will consult TTS.  MDM Rules/Calculators/A&P                           Final Clinical Impression(s) / ED Diagnoses Final diagnoses:  Paranoia [facility])    Rx / DC Orders ED Discharge Orders    None       Bethel Born, PA-C 06/30/20 1716    Sabino Donovan, MD 06/30/20 2112

## 2020-06-30 NOTE — ED Notes (Signed)
Pt voiced understanding of Medical Clearance Pt Policy form - Copy given. Pt has not eaten dinner - states she is not hungry.

## 2020-06-30 NOTE — ED Notes (Signed)
IVC papers - 1st Exam completed by Dr Donnald Garre - Copy of IVC papers faxed to [facility] - Copy sent to Medical Records - Original placed in folder for Magistrate - ALL 3 sets on clipboard.
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
## 2020-07-01 ENCOUNTER — Telehealth (HOSPITAL_COMMUNITY): Payer: Self-pay | Admitting: Psychiatry

## 2020-07-01 LAB — URINALYSIS, ROUTINE W REFLEX MICROSCOPIC
Bilirubin Urine: NEGATIVE
Glucose, UA: NEGATIVE mg/dL
Ketones, ur: 80 mg/dL — AB
Nitrite: NEGATIVE
Protein, ur: 30 mg/dL — AB
Specific Gravity, Urine: 1.013 (ref 1.005–1.030)
pH: 6 (ref 5.0–8.0)

## 2020-07-01 LAB — RAPID URINE DRUG SCREEN, HOSP PERFORMED
Amphetamines: NOT DETECTED
Barbiturates: NOT DETECTED
Benzodiazepines: NOT DETECTED
Cocaine: NOT DETECTED
Opiates: NOT DETECTED
Tetrahydrocannabinol: NOT DETECTED

## 2020-07-01 MED ORDER — OLANZAPINE 15 MG PO TABS
15.0000 mg | ORAL_TABLET | Freq: Every day | ORAL | 0 refills | Status: DC
Start: 2020-07-01 — End: 2020-07-16

## 2020-07-01 MED ORDER — LITHIUM CARBONATE ER 450 MG PO TBCR: 450.0000 mg | EXTENDED_RELEASE_TABLET | Freq: Two times a day (BID) | ORAL | 0 refills | Status: DC

## 2020-07-01 MED ORDER — OLANZAPINE 10 MG IM SOLR
10.0000 mg | Freq: Once | INTRAMUSCULAR | Status: AC
  Administered 2020-07-01: 10 mg via INTRAMUSCULAR
  Filled 2020-07-01: qty 10

## 2020-07-01 MED ORDER — BENZTROPINE MESYLATE 0.5 MG PO TABS: 0.5000 mg | ORAL_TABLET | Freq: Two times a day (BID) | ORAL | 0 refills | Status: DC | PRN

## 2020-07-01 NOTE — ED Notes (Signed)
Patient stated "Can you deliver a message to my husband for me?"  "I'm sorry for hanging up on him earlier. And I still care about him."  And then returned to her room.

## 2020-07-01 NOTE — ED Notes (Signed)
Pt refusing to eat. States that she is not hungry.

## 2020-07-01 NOTE — ED Notes (Signed)
Patient has requested a Child psychotherapist to speak with in order to be released; pt has been given information for acceptance to [facility] on Wednesday; pt continues to pace back and forth to the nurses station stating she is been held against her will and that we are with holding information from her; Pt was advised of IVC but staff could not state who took papers out. Pt continues to stand in doorway and look at [facility]

## 2020-07-01 NOTE — ED Notes (Signed)
Patient states "I think I'm being kept here against my will for more than just safety. I think there are some papers against me." When asked what kind of papers the patient is referring to, she says "I'm not sure."

## 2020-07-01 NOTE — Telephone Encounter (Signed)
sent 

## 2020-07-01 NOTE — ED Notes (Signed)
Pt asked RN to call her spouse but would not advise what she wanted RN to speak w/him about. RN called spouse so pt may speak w/him - Pt advising spouse "They're giving me my meds but they're not letting me go". Pt returned to room after phone call.

## 2020-07-01 NOTE — ED Notes (Addendum)
Called to pts room. "Did I go to sleep?" Told pt yes, I think you did go to sleep for a little while. Pt asked the same questions again. Told pt the same answers, that we have no new information. Pt still angry that she is here and wants to leave. Pt wants her belongings back. Pt wants to know where her valuables are and why she cannot have them back. Explained this over to her again. Pt angry, turned around and went back and sat on bed.

## 2020-07-01 NOTE — ED Notes (Addendum)
Called to pts room. "I need to call my mother". Reminded of phone call hours. Told pt she will be allowed to make 2 phone calls in the morning. "You can call my mother for me then". Told pt that this RN will not be calling her mother at 42 in the morning to tell her that her 29 y/o daughter is here and that she would be able to do that in the morning during phone hours. Pt is visibly upset and angry. "I don't belong here and I want to leave". Reminded pt that she is under IVC and that she may not leave. "I don't feel safe here". Explained to pt that she is very safe here, that we have a [employer] officer here and that the doors are locked and no other patients will be brought over tonight. Pt asking repetitive questions that she knows the answer to and that this RN has already answered before many times. Pt has stated before that she has been IVC'd "many" times before, yet she continues to ask the same questions over and over. This RN told pt that she knows the answer to the questions that she keeps asking. This RN does not know when she will be leaving, and this RN does not know where she will be going, and this RN does not know who IVC'd her and this RN will not let her see her IVC papers.  Pt continues to be very angry. Pt has this very evil look on her face, where she just stares at this RN and says nothing. "I don't belong here and I don't have anything else to say".

## 2020-07-01 NOTE — ED Provider Notes (Addendum)
Emergency Medicine Observation Re-evaluation Note  Natalie [name] is a 29 y.o. female, seen on rounds today.  Pt initially presented to the ED for complaints of Medical Clearance (IVC) Currently, the patient is sitting up, requesting to go home.  Demanding to see who is "keeping me here against my will."  She is concerned that there is a family member that is the behavioral health counselor who is telling her she needs to be here for treatment.  She denies that she is a danger to herself. Does not deny homicidal ideation. She is clearly paranoid against her family.  Physical Exam  BP (!) 130/84 (BP Location: Right Arm)   Pulse 85   Temp 98.3 F (36.8 C) (Oral)   Resp 18   Ht 5\' 4"  (1.626 m)   Wt 88.6 kg   SpO2 100%   BMI 33.53 kg/m  Physical Exam No acute distress.  Speaking complete sentences without difficulty. ED Course / MDM  EKG:    I have reviewed the labs performed to date as well as medications administered while in observation.  Recent changes in the last 24 hours include patient is medically cleared.  Patient's urinalysis with small leukocytes, many bacteria but 11-20 squamous cells.  Unsure why this was collected as previous provider note did not mention symptoms of a UTI.  Patient is denying symptoms.  No treatment needed at this time.  Behavioral health counselor updated. Plan  Current plan is for placement in inpatient facility. Patient is under full IVC at this time.       , PA-C 07/01/20 1829    08/31/20, MD 07/02/20 [id number]

## 2020-07-01 NOTE — BH Assessment (Addendum)
Case was staffed with Maisie Fus NP who recommended a continued inpatient admission as appropriate bed placement is investigated. The adult unit is at it's capacity for the day. Patient referred to the following hospitals for consideration of bed placement:  [facility]     [facility]    [facility]    [facility]    [facility]   [facility]    [facility]

## 2020-07-01 NOTE — Telephone Encounter (Signed)
Pt called.  Needs refills on meds.  [facility].

## 2020-07-01 NOTE — ED Notes (Signed)
In to room to speak with pt. Answering all questions from pt. Pt refusing to lay down on bed. Pt sleepy, but will not lay down.

## 2020-07-01 NOTE — ED Notes (Addendum)
In to speak with pt. All questions answered. Pt refusing to speak to this RN at times. Questioning her medications. Pt extremely paranoid.

## 2020-07-01 NOTE — BH Assessment (Signed)
Patient was accepted to [facility] for admission (Wednesday) 07/03/2020 (after 11am), per Candise Bowens (Intake Coordinator). Patient will need to present to the [facility] upon arrival. The accepting provider is Dr. Baldemar Friday. Nurse report [phone].

## 2020-07-01 NOTE — ED Notes (Signed)
Patient has continued to stand at doorway for hours without  Sitting down; RN attempted to administer PO medication; RN stood in door way for about 10 minutes while patient shook her head holding medication cup; RN had to continue med pass and took pill cup from patient; RN will ask for IM Zyprexa to be ordered due to refusal of PO-Monique,RN

## 2020-07-01 NOTE — ED Notes (Signed)
Patient stood in hallway for about 15 minutes stating that was not her room and she did not want go into the room; pt was advised she is not able to stay in the hallway all night; pt redirected to room and IM administered with no incident-Monique,RN

## 2020-07-01 NOTE — BH Assessment (Signed)
This Probation officer met with patient to evaluate current mental health status. Patient continues to be disorganized and does not seem to process at times the content of this writer's questions. Patient does deny any S/I or H/I although when asked in reference to [place] patient just "shrugged her shoulders." This Probation officer re framed question although patient just starred at Armed forces training and education officer with no response. Case was staffed with Marcello Moores NP who recommended a continued inpatient admission as appropriate bed placement is investigated.

## 2020-07-01 NOTE — ED Notes (Signed)
Pt out to nurses station. "I don't feel comfortable here". "I want to leave". "When can I leave?" Told pt that she is unable to leave and that I have answered all of her questions and that I do not have any more information. Pt just stands and stares at this RN and shakes her head.

## 2020-07-01 NOTE — ED Notes (Signed)
Pt attempted to call husband - refuses assistance w/phone - unsure if pt is dialing correct number. Pt asking repeatedly why she is here and why she can't leave. Voiced understanding of IVC but states she feels they lied about her.

## 2020-07-01 NOTE — ED Notes (Signed)
Pt noted to be sitting on bed - Pt asking why she is having to stay and wanting to be d/c'd. Advised pt of tx plan - Pt noted to be shaking her head back and forth. Pt denies SI/HI. Denies AVH. Pt took med w/water given. Pt refusing to eat breakfast - Offered pt snacks/po fluids - refuses. Pt requested to make a phone call - Pt attempted to call but then hung up phone and returned to room. RN asked pt if she wanted assistance - pt declined. Offered for pt to watch tv, draw, color, read - declines.

## 2020-07-01 NOTE — Telephone Encounter (Signed)
Lm informing rx sent to pharmacy

## 2020-07-01 NOTE — ED Notes (Signed)
"  I want to see my IVC papers" "I want to know what they say and who IVC'd me".  Told pt that I'm sorry but I cannot let her see the papers. Pt angry. Asked pt to return to her room.

## 2020-07-02 NOTE — ED Provider Notes (Signed)
Emergency Medicine Observation Re-evaluation Note  Natalie [name] is a 29 y.o. female, seen on rounds today.  Pt initially presented to the ED for complaints of Medical Clearance (IVC) Currently, the patient is sitting comfortably, no complaints noted.  Physical Exam  BP 121/88 (BP Location: Left Arm)   Pulse (!) 106   Temp 99.4 F (37.4 C) (Oral)   Resp 16   Ht 5\' 4"  (1.626 m)   Wt 88.6 kg   SpO2 100%   BMI 33.53 kg/m  Physical Exam No acute distress noted ED Course / MDM  EKG:    I have reviewed the labs performed to date as well as medications administered while in observation.  Recent changes in the last 24 hours include patient understanding that she needs inpatient placement.  Appears less paranoid. Plan  Current plan is for placement to [facility]. Patient is under full IVC at this time.   [facility], PA-C 07/02/20 1142    09/01/20, MD 07/02/20 [phone]

## 2020-07-02 NOTE — ED Notes (Signed)
No sitter available for patient at this time, CN aware

## 2020-07-02 NOTE — ED Notes (Signed)
Pt called her mother and advised of tx plan as well. Pt voiced understanding she has made her phone calls for the day. Pt continues to sit in chair at doorway of room.

## 2020-07-02 NOTE — ED Notes (Signed)
Attempted to call pt's parents as requested - no answer - [phone] and [phone].

## 2020-07-02 NOTE — ED Notes (Signed)
[facility] Sheriff's Deputy - Sgt Paschal - aware of need for transportation to [facility] - Pt may arrive after 11AM. Deputy will call in AM to advise of arrival time.

## 2020-07-02 NOTE — ED Notes (Signed)
Pt noted to be standing in hallway - stating she needs to go to bathroom but will not go. RN advised pt she may either go to the restroom or return to her room. Pt returned to room then ambulated to restroom. Pt then returned to room - sitting in chair in doorway.

## 2020-07-02 NOTE — ED Notes (Signed)
Pt in shower. Pt declining to eat breakfast even after much encouragement.

## 2020-07-02 NOTE — ED Notes (Signed)
Pt to given necessary supplies by sitter for shower, pt ambulated with steady gait to shower room

## 2020-07-02 NOTE — ED Notes (Signed)
Pt voiced understanding of tx plan - Accepted to [facility] tomorrow (07/03/20) - Pt initially states she does not feel she needs to go then states "so I am going so I can think things through better?" Pt noted to be sitting in chair near doorway. Pt declines to eat/drink even after much encouragement.

## 2020-07-02 NOTE — ED Notes (Signed)
Pt called her husband to notify of tx plan - Accepted to [facility] tomorrow (07/03/20). Pt returned to sitting in chair in room. Offered for pt to watch tv, color, draw, write, or read a book - pt declined.

## 2020-07-02 NOTE — ED Notes (Signed)
Pt talking w/mother on phone.

## 2020-07-03 NOTE — ED Notes (Signed)
Lunch Tray Ordered @ 1059.  

## 2020-07-03 NOTE — ED Notes (Signed)
Pt up to doorway, questioning what she needs to do now and if she is still leaving today. Pt advised she will be transported to [facility] after 11 am today.  Pt encouraged to get more rest as it is not morning yet. Pt denies need for food/beverage/bathroom at this time.

## 2020-07-03 NOTE — ED Notes (Signed)
[place] transferred [place] [facility]. Pt spoke to PA prior to transfer and verbalized understanding of transfer and further psychiatric treatment at facility. Belonging and valuables given to [facility].

## 2020-07-03 NOTE — ED Notes (Signed)
Breakfast Ordered 

## 2020-07-03 NOTE — ED Provider Notes (Signed)
Emergency Medicine Observation Re-evaluation Note  Natalie [name] is a 29 y.o. female, seen on rounds today.  Pt initially presented to the ED for complaints of Medical Clearance (IVC) Currently, the patient is Standing in doorway calmly.  Physical Exam  BP 126/72 (BP Location: Right Arm)   Pulse 68   Temp 98.7 F (37.1 C) (Oral)   Resp 16   Ht 5\' 4"  (1.626 m)   Wt 88.6 kg   SpO2 100%   BMI 33.53 kg/m  Physical Exam Vitals and nursing note reviewed.  Constitutional:      General: She is not in acute distress.    Appearance: She is well-developed. She is not diaphoretic.     Comments: Standing in doorway calmly in no acute distress  HENT:     Head: Normocephalic and atraumatic.  Eyes:     General:        Right eye: No discharge.        Left eye: No discharge.  Pulmonary:     Effort: Pulmonary effort is normal. No respiratory distress.  Neurological:     Mental Status: She is alert.     Coordination: Coordination normal.  Psychiatric:        Behavior: Behavior normal.     ED Course / MDM  EKG:  Labs Reviewed  COMPREHENSIVE METABOLIC PANEL - Abnormal; Notable for the following components:      Result Value   CO2 18 (*)    BUN <5 (*)    All other components within normal limits  SALICYLATE LEVEL - Abnormal; Notable for the following components:   Salicylate Lvl <7.0 (*)    All other components within normal limits  ACETAMINOPHEN LEVEL - Abnormal; Notable for the following components:   Acetaminophen (Tylenol), Serum <10 (*)    All other components within normal limits  CBC - Abnormal; Notable for the following components:   Hemoglobin 11.5 (*)    RDW 15.9 (*)    All other components within normal limits  URINALYSIS, ROUTINE W REFLEX MICROSCOPIC - Abnormal; Notable for the following components:   APPearance CLOUDY (*)    Hgb urine dipstick SMALL (*)    Ketones, ur 80 (*)    Protein, ur 30 (*)    Leukocytes,Ua SMALL (*)    Bacteria, UA MANY (*)    All other  components within normal limits  LITHIUM LEVEL - Abnormal; Notable for the following components:   Lithium Lvl 0.18 (*)    All other components within normal limits  SARS CORONAVIRUS 2 BY RT PCR (HOSPITAL ORDER, PERFORMED IN [facility])  ETHANOL  RAPID URINE DRUG SCREEN, HOSP PERFORMED  I-STAT BETA HCG BLOOD, ED ([facility], [facility], [facility] ONLY)  I-STAT BETA HCG BLOOD, ED ([facility], [facility], [facility] ONLY)  I-STAT BETA HCG BLOOD, ED ([facility], [facility], [facility] ONLY)   I have reviewed the labs performed to date as well as medications administered while in observation.  No recent changes in the last 24 hours. Plan  Current plan is for inpatient treatment at [facility], and transport has just arrived. Patient is under full IVC at this time.   [facility], PA-C 07/03/20 1133    [place], 09/02/20, MD 07/03/20 1230

## 2020-07-03 NOTE — ED Notes (Signed)
Report Given to Minerva Fester., RN at [facility]

## 2020-07-13 ENCOUNTER — Other Ambulatory Visit (HOSPITAL_COMMUNITY): Payer: Self-pay | Admitting: Psychiatry

## 2020-07-16 ENCOUNTER — Encounter (HOSPITAL_COMMUNITY): Payer: Self-pay | Admitting: Psychiatry

## 2020-07-16 ENCOUNTER — Telehealth (INDEPENDENT_AMBULATORY_CARE_PROVIDER_SITE_OTHER): Payer: Medicaid Other | Admitting: Psychiatry

## 2020-07-16 ENCOUNTER — Telehealth: Payer: Self-pay

## 2020-07-16 DIAGNOSIS — F312 Bipolar disorder, current episode manic severe with psychotic features: Secondary | ICD-10-CM | POA: Diagnosis not present

## 2020-07-16 DIAGNOSIS — F411 Generalized anxiety disorder: Secondary | ICD-10-CM

## 2020-07-16 MED ORDER — OLANZAPINE 15 MG PO TABS: 15.0000 mg | ORAL_TABLET | Freq: Every day | ORAL | 0 refills | Status: DC

## 2020-07-16 MED ORDER — BENZTROPINE MESYLATE 0.5 MG PO TABS: 0.5000 mg | ORAL_TABLET | Freq: Two times a day (BID) | ORAL | 0 refills | Status: DC | PRN

## 2020-07-16 MED ORDER — LITHIUM CARBONATE ER 450 MG PO TBCR: 900.0000 mg | EXTENDED_RELEASE_TABLET | Freq: Every day | ORAL | 0 refills | Status: DC

## 2020-07-16 NOTE — Telephone Encounter (Signed)
Called patient. Reports overall doing better. She is better with PJ---feels relationship is going better.   - Wonders when Depo is due --> October - Asked about bilateral intermittent leg cramps. No edema, CP, dyspnea. Recommend Tylenol and water. Will discuss at visit.  Otherwise, doing well. Follow up scheduled.  [name], MD  Family Medicine Teaching Service

## 2020-07-16 NOTE — Progress Notes (Signed)
[facility] Follow up    Patient Identification: Natalie [name] MRN:  [id number] Date of Evaluation:  07/16/2020 Referral Source: primary care Chief Complaint:   Bipolar follow up    Hospital discharge       I connected with Natalie [name] on 07/16/20 at  2:00 PM EDT by a video enabled telemedicine application and verified that I am speaking with the correct person using two identifiers.  I discussed the limitations, risks, security and privacy concerns of performing an evaluation and management service by telephone and the availability of in person appointments. I also discussed with the patient that there may be a patient responsible charge related to this service. The patient expressed understanding and agreed to proceed. Patient location : home Provider location: home office   Visit Diagnosis: Bipolar, GAD, insomnia   ICD-10-CM   1. Bipolar disorder, current episode manic severe with psychotic features (HCC)  F31.2   2. GAD (generalized anxiety disorder)  F41.1     History of Present Illness: 29 year old currently married African-American female referred by primary care physician for management of bipolar disorder.  History of Fanapt use but was not covered, med was changed due to prolactin concern  After last visit she has called to stop trileptal, we recommended to change to night time but she wanted to stop. Has non compliance history with recommendations  Today seen with her husband, recently again admitted to hospital.says got impulsive threw her meds, argument with husband, husband says she does that when not well and was feeling low and felt she may decompensate so involuntary committed . Says hospital was [facility] in [place] kept near 2 weeks, meds all changed to night time Not on ativan , lithium taking both at night and that helped, states she is doing fine, not edgy or depressed  Patient endorses feeling fair, not impulsive or depressed, feels husband good  support  Labs or discharge summary not available but patient and her husband says lithium level was checked and was fine as verbal report  Modifying factors; disability income,husband Aggravating factor mental health, past  Non compliance history multiple medication or antipsychotics that she has tried  Duration 10 plus years Severity  better since hospital discharge Husband is supportive Denies tremors  Past Psychiatric History: bipolar    Past Medical History:  Past Medical History:  Diagnosis Date  . Anxiety   . Chronic bipolar disorder (HCC)   . Depression   . H/O suicide attempt 05-2013   OD  . Obesity   . OCD (obsessive compulsive disorder)   . Psychiatric diagnosis    History reviewed. No pertinent surgical history.  Family Psychiatric History: father : bipolar has been on seroquel  Family History:  Family History  Problem Relation Age of Onset  . Hypertension Mother   . Depression Father   . Hypertension Father   . Bipolar disorder Father   . Hypertension Maternal Grandmother   . Obesity Maternal Grandmother   . Hypertension Paternal Grandmother     Social History:   Social History   Socioeconomic History  . Marital status: Married    Spouse name: Not on file  . Number of children: Not on file  . Years of education: Not on file  . Highest education level: Not on file  Occupational History  . Not on file  Tobacco Use  . Smoking status: Never Smoker  . Smokeless tobacco: Never Used  Vaping Use  . Vaping Use: Never used  Substance and Sexual Activity  .  Alcohol use: No  . Drug use: No  . Sexual activity: Yes    Birth control/protection: I.U.D.  Other Topics Concern  . Not on file  Social History Narrative   Husband is [employer], dances, and sings for fun    Social Determinants of Health   Financial Resource Strain:   . Difficulty of Paying Living Expenses:   Food Insecurity:   . Worried About Programme researcher, broadcasting/film/video in  the Last Year:   . Barista in the Last Year:   Transportation Needs:   . Freight forwarder (Medical):   Marland Kitchen Lack of Transportation (Non-Medical):   Physical Activity:   . Days of Exercise per Week:   . Minutes of Exercise per Session:   Stress:   . Feeling of Stress :   Social Connections:   . Frequency of Communication with Friends and Family:   . Frequency of Social Gatherings with Friends and Family:   . Attends Religious Services:   . Active Member of Clubs or Organizations:   . Attends Banker Meetings:   Marland Kitchen Marital Status:      Allergies:  No Known Allergies  Metabolic Disorder Labs: Lab Results  Component Value Date   HGBA1C 5.5 03/11/2020   MPG 100 09/03/2014   Lab Results  Component Value Date   PROLACTIN 34.1 (H) 03/11/2020   Lab Results  Component Value Date   CHOL 141 04/21/2020   TRIG 54 04/21/2020   HDL 42 04/21/2020   CHOLHDL 3.4 04/21/2020   VLDL 11 04/21/2020   LDLCALC 88 04/21/2020   LDLCALC 149 (H) 10/23/2019   Lab Results  Component Value Date   TSH 0.418 (L) 06/21/2020    Therapeutic Level Labs: Lab Results  Component Value Date   LITHIUM 0.18 (L) 06/30/2020   Lab Results  Component Value Date   CBMZ 3.7 (L) 10/20/2014   Lab Results  Component Value Date   VALPROATE 79.2 07/13/2014    Current Medications: Current Outpatient Medications  Medication Sig Dispense Refill  . acetaminophen (TYLENOL) 325 MG tablet Take 650 mg by mouth every 6 (six) hours as needed for mild pain or headache.    . benztropine (COGENTIN) 0.5 MG tablet Take 1 tablet (0.5 mg total) by mouth 2 (two) times daily as needed for tremors. 60 tablet 0  . docusate sodium (COLACE) 100 MG capsule Take 1 capsule (100 mg total) by mouth 2 (two) times daily. For constipation 60 capsule 0  . lithium carbonate (ESKALITH) 450 MG CR tablet Take 2 tablets (900 mg total) by mouth at bedtime. For mood stabilization 60 tablet 0  . OLANZapine (ZYPREXA) 15  MG tablet Take 1 tablet (15 mg total) by mouth at bedtime. 30 tablet 0  . Prenatal w/o A Vit-Fe Fum-FA (PRENATAL VITAMIN W/FE, FA) 29-1 MG CHEW Chew 1 tablet by mouth daily. (Patient not taking: Reported on 07/01/2020) 360 tablet 0  . senna-docusate (SENOKOT-S) 8.6-50 MG tablet Take 1 tablet by mouth at bedtime. (May buy from over the counter): For constipation 1 tablet 0   No current facility-administered medications for this visit.    Musculoskeletal: Denies stiffness, restlessness  Psychiatric Specialty Exam: Review of Systems  Cardiovascular: Negative for chest pain.  Skin: Negative for rash.  Psychiatric/Behavioral: Negative for suicidal ideas.    There were no vitals taken for this visit.There is no height or weight on file to calculate BMI.  General Appearance: casual  Eye Contact:   Speech:  Slow  Volume:  Normal  Mood: fair  Affect:  Congruent  Thought Process:  Goal Directed  Orientation:  Full (Time, Place, and Person)  Thought Content:  Logical  Suicidal Thoughts:  No  Homicidal Thoughts:  No  Memory:  Immediate;   Fair Recent;   Fair  Judgement:  Fair  Insight:  Shallow  Psychomotor Activity:  Normal  Concentration:  fair  Recall:  [employer] of Knowledge:Fair  Language: Fair  Akathisia:  No  Handed:  Right  AIMS (if indicated):  No abnormal movements  Assets:  Desire for Improvement Housing Social Support  ADL's:  Intact  Cognition: WNL  Sleep:  Fair   Screenings: AUDIT     [facility] (Dis[facility]) [facility] (Dis[facility] Ad[facility] (Dis[facility]) [facility] Ad[facility] (Dis[facility]  Alcohol Use Disorder Identification Test  Final Score (AUDIT) 0 0 0 0 0    GAD-7     Office Visit from 03/19/2017 in [facility]  Total GAD-7 Score 0    PHQ2-9     Office Visit from 06/21/2020 in [facility] Office Visit from 02/12/2020 in [facility] Office Visit from 03/19/2017 in [facility]  PHQ-2 Total Score 0 0 0  PHQ-9 Total Score 2 -- 0      Assessment and Plan: as follows Bipolar disorder, mixed or depressed :feels fair but recenlty admitted due to edgy, depression.  Takes lithium, zyprexa cogentin at night Husband feels doing fair as well  GAD: fluctuates, continue on coping skills  Consider therapy if relationship is concern to effecting mood.   Insomnia: takes naps during day, discussed to avoid. Takes most meds at night now Husband agrees to plan and to keep her engaged during the days, avoid naps  I discussed the assessment and treatment plan with the patient. The patient was provided an opportunity to ask questions and all were answered. The patient agreed with the plan and demonstrated an understanding of the instructions.  refills sent The patient was advised to call back or seek an in-person evaluation if the symptoms worsen or if the condition fails to improve as anticipated. Fu 47m. meds reviewed  Non face to face time spent: 20 min to .  Thresa Ross, MD 8/17/20212:18 PM

## 2020-07-16 NOTE — Telephone Encounter (Signed)
Patient calls and LVM on nurse line requesting to speak to PCP regarding multiple health concerns.   Please return call to patient at [phone]  Veronda Prude, RN

## 2020-07-22 ENCOUNTER — Other Ambulatory Visit: Payer: Self-pay

## 2020-07-22 ENCOUNTER — Ambulatory Visit (INDEPENDENT_AMBULATORY_CARE_PROVIDER_SITE_OTHER): Payer: Medicaid Other | Admitting: Family Medicine

## 2020-07-22 ENCOUNTER — Encounter: Payer: Self-pay | Admitting: Family Medicine

## 2020-07-22 VITALS — BP 120/75 | HR 116 | Ht 64.0 in | Wt 190.8 lb

## 2020-07-22 DIAGNOSIS — Z23 Encounter for immunization: Secondary | ICD-10-CM

## 2020-07-22 DIAGNOSIS — M6281 Muscle weakness (generalized): Secondary | ICD-10-CM | POA: Diagnosis not present

## 2020-07-22 DIAGNOSIS — F3181 Bipolar II disorder: Secondary | ICD-10-CM | POA: Diagnosis not present

## 2020-07-22 MED ORDER — BACLOFEN 5 MG PO TABS: 5.0000 mg | ORAL_TABLET | Freq: Every evening | ORAL | 0 refills | Status: DC

## 2020-07-22 NOTE — Progress Notes (Signed)
    SUBJECTIVE:   CHIEF COMPLAINT / HPI:  Natalie [name] is a pleasant 29 year old with history of obesity, bipolar disorder, and intermittent pain presenting today with pain syndrome.   She reports since being discharged from [facility]. She reports mood is good. Medications are working. Her relationship with PJ has been restored. Denies oversedation, excess fatigue, change in appetite.  The patient has several concerns today. She has a history of intermittent muscle pain and reports it has returned. Pain is bilateral in arms and legs. Extends from shoulder to wrist and from hip flexor ankles. No redness, edema, falls, dizziness, neuropathic symptoms. She reports she feels weak but only intermittently. No ptosis. No difficulty speaking.   PERTINENT  PMH / PSH/Family/Social History : Reviewed and updated.   OBJECTIVE:   BP 120/75   Pulse (!) 116   Ht 5\' 4"  (1.626 m)   Wt 190 lb 12.8 oz (86.5 kg)   LMP 07/14/2020   SpO2 97%   BMI 32.75 kg/m   HEENT: Sclera anicteric. Dentition is moderate. Appears well hydrated. Neck: Supple Cardiac: Regular rate and rhythm. Normal S1/S2. No murmurs, rubs, or gallops appreciated. Lungs: Clear bilaterally to ascultation.  Abdomen: Normoactive bowel sounds. No tenderness to deep or light palpation. No rebound or guarding.  Extremities: Warm, well perfused without edema.  Skin: Warm, dry Psych: Pleasant and appropriate  MSK Full ROM of UE  Preserved 5/5 UE strength Preserved 5/5 LE strength of hip and knee flexors Symmetric reflexes    ASSESSMENT/PLAN:   Morbid obesity (HCC) Discussed benefits of weight loss.    Generalized pain without objective strength deficit, uncertain etiology, correlates with timing correlation of new medications. Also considered myopathy (checking CK), renal disease, less likely myasthenia. No speech or nerve deficits, intermittent throughout day. Will evaluate further, given pain at night, small Rx for  baclofen. Discussed side effects. If persists, consider NCV and EMG.   HCM COVID vaccine given.   07/16/2020, MD  Family Medicine Teaching Service  [facility]

## 2020-07-22 NOTE — Assessment & Plan Note (Signed)
Discussed benefits of weight loss.

## 2020-07-22 NOTE — Patient Instructions (Addendum)
It was wonderful to see you today.  Please bring ALL of your medications with you to every visit.   Today we talked about:  --- Going to the lab--- I will call you with results   --- You can try a small dose of baclofen at night---do not drive or take or take with alcohol   Thank you for choosing [facility].   Please call [phone] with any questions about today's appointment.  Please be sure to schedule follow up at the front  desk before you leave today.   [name], MD  Family Medicine

## 2020-07-23 ENCOUNTER — Telehealth: Payer: Self-pay | Admitting: Family Medicine

## 2020-07-23 DIAGNOSIS — D500 Iron deficiency anemia secondary to blood loss (chronic): Secondary | ICD-10-CM

## 2020-07-23 DIAGNOSIS — K5903 Drug induced constipation: Secondary | ICD-10-CM

## 2020-07-23 LAB — CBC
Hematocrit: 32 % — ABNORMAL LOW (ref 34.0–46.6)
Hemoglobin: 10.3 g/dL — ABNORMAL LOW (ref 11.1–15.9)
MCH: 28.3 pg (ref 26.6–33.0)
MCHC: 32.2 g/dL (ref 31.5–35.7)
MCV: 88 fL (ref 79–97)
Platelets: 315 10*3/uL (ref 150–450)
RBC: 3.64 x10E6/uL — ABNORMAL LOW (ref 3.77–5.28)
RDW: 14.4 % (ref 11.7–15.4)
WBC: 8.2 10*3/uL (ref 3.4–10.8)

## 2020-07-23 LAB — HEPATIC FUNCTION PANEL
ALT: 22 IU/L (ref 0–32)
AST: 21 IU/L (ref 0–40)
Albumin: 3.7 g/dL — ABNORMAL LOW (ref 3.9–5.0)
Alkaline Phosphatase: 81 IU/L (ref 48–121)
Bilirubin Total: 0.3 mg/dL (ref 0.0–1.2)
Bilirubin, Direct: 0.08 mg/dL (ref 0.00–0.40)
Total Protein: 7.4 g/dL (ref 6.0–8.5)

## 2020-07-23 LAB — BASIC METABOLIC PANEL
BUN/Creatinine Ratio: 12 (ref 9–23)
BUN: 6 mg/dL (ref 6–20)
CO2: 20 mmol/L (ref 20–29)
Calcium: 9 mg/dL (ref 8.7–10.2)
Chloride: 106 mmol/L (ref 96–106)
Creatinine, Ser: 0.51 mg/dL — ABNORMAL LOW (ref 0.57–1.00)
GFR calc Af Amer: 150 mL/min/{1.73_m2} (ref >59)
GFR calc non Af Amer: 130 mL/min/{1.73_m2} (ref >59)
Glucose: 85 mg/dL (ref 65–99)
Potassium: 4.4 mmol/L (ref 3.5–5.2)
Sodium: 139 mmol/L (ref 134–144)

## 2020-07-23 LAB — CK: Total CK: 40 U/L (ref 32–182)

## 2020-07-23 LAB — VITAMIN D 25 HYDROXY (VIT D DEFICIENCY, FRACTURES): Vit D, 25-Hydroxy: 17.7 ng/mL — ABNORMAL LOW (ref 30.0–100.0)

## 2020-07-23 MED ORDER — SENNA 8.6 MG PO TABS: ORAL_TABLET | ORAL | 0 refills | Status: DC

## 2020-07-23 MED ORDER — FERROUS SULFATE 324 (65 FE) MG PO TBEC: DELAYED_RELEASE_TABLET | ORAL | 3 refills | Status: DC

## 2020-07-23 NOTE — Telephone Encounter (Signed)
Discussed results--mild anemia, likely due to menses. Will repeat ferritin and CBC at follow up. Recommended every other day iron. Discussed side effects, RX for preferred laxative.  At follow up--repeat CBC, ferritin,discuss vitamin D rich foods.  All questions answered. Reviewed reasons to call and return to care.   [name], MD  Family Medicine Teaching Service

## 2020-07-24 ENCOUNTER — Telehealth: Payer: Self-pay

## 2020-07-24 ENCOUNTER — Telehealth: Payer: Self-pay | Admitting: Family Medicine

## 2020-07-24 NOTE — Telephone Encounter (Signed)
Patient calls nurse line stating she just spoke with PCP, however she forgot to get her opinion on hemorrhoids. Patient reports she has them and they are irritating. Patient advised to use OTC creams/ointmnets to help with irritations and shrinking. Patient appreciative, however would still like to speak with PCP.

## 2020-07-24 NOTE — Telephone Encounter (Signed)
Called patient. Reports some leg cramps and aches after Pfizer vaccine, now resolved. All questions answered.  [name], MD  Family Medicine Teaching Service

## 2020-07-25 NOTE — Telephone Encounter (Signed)
Responded via mychart to separate message.   If patient calls back, please advise she should also be using her Senna at least once a day. If these continue to be an issue, she will need to be seen for the condition.  [name], MD  Family Medicine Teaching Service

## 2020-07-25 NOTE — Telephone Encounter (Signed)
Patient LVM on nurse line again. Patient reports she feels her iron dose is too strong and keeping her constipated. Patient would like to know if its still necessary to continue? Patient also states the muscle relaxers are not working, she would like to up her dosage. Please advise.

## 2020-07-25 NOTE — Telephone Encounter (Signed)
Please schedule the patient a visit at her earliest convenience to discuss these multiple concerns. I will have time to call them tomorrow afternoon after 4 PM if they prefer that. It will be between 4 PM and 7 PM.  [name], MD  [facility] Medicine Teaching Service

## 2020-07-26 NOTE — Telephone Encounter (Signed)
Patient calls nurse line again to speak with PCP. Patient advised due to multiple concerns she would need to be seen. Patient reports transportation is an issue for her and she "can not keep coming in here." Patient does have an apt on 9/27. Patient advised PCP may be able to call her later on this evening. Patient agreed to be available between 4-7p to take her call.

## 2020-07-26 NOTE — Telephone Encounter (Signed)
Called patient at 4:08 PM. Spent 15 minutes discussing plan of care with patient and husband. She is extremely angry about iron therapy (side effect, which was discussed, see prior note). Recommended stopping, will place on intolerance list. Discussed options at length. Patient upset about situation. She states they cannot get to store or pharmacy and would like to have BM now. She is not amenable to any medications. At end of conversation, amenable to trying 8 ounces of juice.   All questions answered.  [name], MD  Family Medicine Teaching Service

## 2020-07-26 NOTE — Addendum Note (Signed)
Addended by: Manson Passey, Jaspreet Bodner on: 07/26/2020 04:23 PM   Modules accepted: Orders

## 2020-07-30 ENCOUNTER — Telehealth: Payer: Self-pay

## 2020-07-30 MED ORDER — DICLOFENAC SODIUM 1 % EX GEL: 4.0000 g | Freq: Four times a day (QID) | CUTANEOUS | 3 refills | Status: DC

## 2020-07-30 NOTE — Telephone Encounter (Signed)
Patient LVM on nurse line requesting to speak with Dr. Manson Passey. Patient reports that digestive system issues have regulated. However, patient continues to have worsening pain and soreness in muscles.   Please advise additional recommendations for patient.   To PCP  Veronda Prude, RN

## 2020-07-30 NOTE — Telephone Encounter (Signed)
Patient call to inform me of improvement in stools. Has ongoing radiating right leg pain. Denies incontinence. Intermittent pain from hip. Discussed options at length. Voltaren prescribed to back.  All questions answered.  [name], MD  Family Medicine Teaching Service

## 2020-08-01 ENCOUNTER — Telehealth: Payer: Self-pay | Admitting: Family Medicine

## 2020-08-01 NOTE — Telephone Encounter (Signed)
Called patient and she states that she will wait to discuss referral at 08/26/2020 with Dr. Manson Passey.  Glennie Hawk, CMA

## 2020-08-01 NOTE — Telephone Encounter (Signed)
I am happy to discuss referral at upcoming appointment. If would like before that time, needs appointment. Can be seen in ATC or with resident physician. Please call and let patient know.  [name], MD  Family Medicine Teaching Service

## 2020-08-01 NOTE — Telephone Encounter (Signed)
Patient calls requesting a referral for a neurologist. Patient says she this is an emergency, as her entire body is in pain. She says she has discussed this referral with Dr. Manson Passey before.   Please let patient know where she is being referred to so she can reach out to them.

## 2020-08-06 ENCOUNTER — Ambulatory Visit (INDEPENDENT_AMBULATORY_CARE_PROVIDER_SITE_OTHER): Payer: Medicaid Other | Admitting: Family Medicine

## 2020-08-06 ENCOUNTER — Encounter: Payer: Self-pay | Admitting: Family Medicine

## 2020-08-06 ENCOUNTER — Other Ambulatory Visit: Payer: Self-pay

## 2020-08-06 VITALS — BP 118/78 | HR 100 | Wt 190.0 lb

## 2020-08-06 DIAGNOSIS — R52 Pain, unspecified: Secondary | ICD-10-CM

## 2020-08-06 NOTE — Patient Instructions (Signed)
It was great to see you!  Our plans for today:  -Per your request I put in a referral for neurology for your chronic upper and lower limb pain. -In the meantime I do think aerobic exercise such as walking, or other cardio exercise may provide some benefit. -As you recently had blood work checked I do not think we need to check any at this time. -As we discussed there may be some utility down the road in medications like gabapentin to help with your pain, but at your request we will hold off at this time and wait for neurology recommendations.   Take care and seek immediate care sooner if you develop any concerns.   Dr. Daymon Larsen [facility]

## 2020-08-06 NOTE — Progress Notes (Signed)
    SUBJECTIVE:   CHIEF COMPLAINT / HPI:   Whole-Body Pain Started a year or more ago. Both arms and legs hurt. Seems worse in right leg. States it feels like a "severe sprain" of both arms and legs. No previous trauma. Happens every day, all day long. 10/10 pain right now. Tried tylenol but no help. Previously had referral to physical medicine for hand numbness but patient cancelled that appointment. No worse in morning or at night, is consistent all day. Sometimes wakes her up from sleep. Pain in arms, some in the muscle but also contains of some shooting pains down to the hands. Some numbness in hands.   PERTINENT  PMH / PSH: History of bipolar disorder  OBJECTIVE:   BP 118/78   Pulse 100   Wt 190 lb (86.2 kg)   LMP 07/14/2020   SpO2 98%   BMI 32.61 kg/m    General: NAD, pleasant, able to participate in exam Cardiac: RRR, no murmurs. Respiratory: CTAB, normal effort Extremities: No rashes, edema, or cyanosis noted in upper or lower extremities bilaterally.  Patient with pain to palpation of upper arms as well as calves bilaterally.  States the pain is worse in the right calf.  Calves 14 1/4" bilaterally, no bulging vasculature or skin changes suggestive of DVT Skin: warm and dry, no rashes noted Neuro: alert, no obvious focal deficits Psych: Normal affect and mood  ASSESSMENT/PLAN:   Total body pain Assessment: 29 year old female with complaint of bilateral arm and leg pain which has been going on for approximately 1 year.  Patient states that the pain is not worse at any particular time of the day and is a 10 out of 10 right now and states it is consistently been this through periods of last year.  Patient is use some acetaminophen with little benefit.  States the pain is sometimes shooting and she sometimes has numbness in her hands but that the pain is also present to palpation of the bulk of the musculature of her arms and legs.  Denies pain elsewhere.  Overall differential can  include disorder such as fibromyalgia, complex regional pain syndrome, less likely polymyositis with normal CK during recent labs.  Patient also has a significant psychiatric history which may be an exacerbating factor in her symptoms.  Patient endorses the worst of her symptoms is in her right calf.  Right calf with no swelling versus the left, no bulging vasculature noted on physical exam, no skin changes, unlikely DVT.  On physical exam patient endorses similar pain in her left calf as well as her arms bilaterally. Plan: -Discussed with patient possibilities for treatment which can include gabapentin trial which patient refuses.  Patient states that she is not interested in any medications at this time and is primarily interested in getting referred to neurology as she states she believes this will provide her the best benefit. -Discussed with patient other modalities she can use that may improve her symptoms such as cardiovascular exercise, stretching, physical therapy, and continuing with acetaminophen as needed. -Did provide patient with the referral to neurology which she requested on multiple occasions throughout the encounter.     Jackelyn Poling, DO [facility]

## 2020-08-06 NOTE — Assessment & Plan Note (Addendum)
Assessment: 29 year old female with complaint of bilateral arm and leg pain which has been going on for approximately 1 year.  Patient states that the pain is not worse at any particular time of the day and is a 10 out of 10 right now and states it is consistently been this through periods of last year.  Patient is use some acetaminophen with little benefit.  States the pain is sometimes shooting and she sometimes has numbness in her hands but that the pain is also present to palpation of the bulk of the musculature of her arms and legs.  Denies pain elsewhere.  Overall differential can include disorder such as fibromyalgia, complex regional pain syndrome, less likely polymyositis with normal CK during recent labs.  Patient also has a significant psychiatric history which may be an exacerbating factor in her symptoms.  Patient endorses the worst of her symptoms is in her right calf.  Right calf with no swelling versus the left, no bulging vasculature noted on physical exam, no skin changes, unlikely DVT.  On physical exam patient endorses similar pain in her left calf as well as her arms bilaterally. Plan: -Discussed with patient possibilities for treatment which can include gabapentin trial which patient refuses.  Patient states that she is not interested in any medications at this time and is primarily interested in getting referred to neurology as she states she believes this will provide her the best benefit. -Discussed with patient other modalities she can use that may improve her symptoms such as cardiovascular exercise, stretching, physical therapy, and continuing with acetaminophen as needed. -Did provide patient with the referral to neurology which she requested on multiple occasions throughout the encounter.

## 2020-08-07 ENCOUNTER — Telehealth: Payer: Self-pay | Admitting: Family Medicine

## 2020-08-07 ENCOUNTER — Ambulatory Visit: Payer: Medicaid Other

## 2020-08-07 NOTE — Telephone Encounter (Addendum)
Called patient regarding concerns. Wants to know status of Neurology referral, update provided. Will message when referral sent out.   [name], MD  Family Medicine Teaching Service

## 2020-08-09 NOTE — Telephone Encounter (Signed)
Jazmin--can you please let patient know which Neurology ([facility]) her referral was sent to? Please include Dr. Vergie Living note as he saw her.  Thank you, [name], MD  [facility] Medicine Teaching Service

## 2020-08-12 ENCOUNTER — Ambulatory Visit (INDEPENDENT_AMBULATORY_CARE_PROVIDER_SITE_OTHER): Payer: Medicaid Other

## 2020-08-12 ENCOUNTER — Other Ambulatory Visit: Payer: Self-pay

## 2020-08-12 DIAGNOSIS — Z23 Encounter for immunization: Secondary | ICD-10-CM | POA: Diagnosis not present

## 2020-08-12 NOTE — Telephone Encounter (Signed)
Referral sent to [facility].  Patient is aware and will wait for their call.  Jashae Wiggs,CMA

## 2020-08-12 NOTE — Progress Notes (Signed)
° °  Covid-19 Vaccination Clinic  Name:  Natalie [name]    MRN: [id number] DOB: 1991-06-21  08/12/2020  [name] was observed post Covid-19 immunization for 15 minutes without incident. She was provided with Vaccine Information Sheet and instruction to access the V-Safe system.   [name] was instructed to call 911 with any severe reactions post vaccine:  Difficulty breathing   Swelling of face and throat   A fast heartbeat   A bad rash all over body   Dizziness and weakness   #2 Covid Vaccine administered RD without complication.

## 2020-08-15 ENCOUNTER — Telehealth (HOSPITAL_COMMUNITY): Payer: Medicaid Other | Admitting: Psychiatry

## 2020-08-26 ENCOUNTER — Ambulatory Visit: Payer: Medicaid Other | Admitting: Family Medicine

## 2020-09-02 ENCOUNTER — Ambulatory Visit: Payer: Medicaid Other

## 2020-09-02 ENCOUNTER — Ambulatory Visit: Payer: Medicaid Other | Admitting: Family Medicine

## 2020-09-06 ENCOUNTER — Ambulatory Visit (INDEPENDENT_AMBULATORY_CARE_PROVIDER_SITE_OTHER): Payer: Medicaid Other

## 2020-09-06 ENCOUNTER — Other Ambulatory Visit: Payer: Self-pay

## 2020-09-06 DIAGNOSIS — Z30013 Encounter for initial prescription of injectable contraceptive: Secondary | ICD-10-CM

## 2020-09-06 MED ORDER — MEDROXYPROGESTERONE ACETATE 150 MG/ML IM SUSP
150.0000 mg | Freq: Once | INTRAMUSCULAR | Status: AC
  Administered 2020-09-06: 150 mg via INTRAMUSCULAR

## 2020-09-06 NOTE — Progress Notes (Signed)
Patient here today for Depo Provera injection and is within her dates.    Last contraceptive appt was 06/21/2020.  Depo given in RD today, per patient preference. Site unremarkable & patient tolerated injection.    Next injection due 11/22/2020-12/06/2020.  Reminder card given.

## 2020-09-23 ENCOUNTER — Telehealth: Payer: Self-pay

## 2020-09-23 NOTE — Telephone Encounter (Signed)
Patient calls nurse line adamantly requesting to speak to Dr. Manson Passey regarding multiple concerns. First, patient is concerned about not having had a period in two months. Of note, patient received depo injection on 09/06/20. Patient reports having taken multiple negative pregnancy tests.   Other concerns patient has include medication side effects. Patient reports excessive sweating and agitation and would like to discuss medications with PCP.   Patient states that she is not able to come into the office due to transportation.   To PCP  Veronda Prude, RN

## 2020-09-24 NOTE — Telephone Encounter (Signed)
Returned call. Patient did not answer. Left generic voicemail to call back. If calls back: - Schedule appointment to be seen about menses and sweating.  - Instruct her to call Dr. Fredda Hammed (psychiatry) about agitation.   I will try again tomorrow PM.  [name], MD  [facility] Medicine Teaching Service

## 2020-09-25 NOTE — Telephone Encounter (Signed)
Called patient about questions. She has started light menses last two days. Discussed expected bleeding pattern with Depo. All questions answered.   New psychiatrist is NP Leone Payor [facility].   [name], MD  Family Medicine Teaching Service

## 2020-09-25 NOTE — Telephone Encounter (Signed)
Patient returns call to nurse line requesting to speak to Dr. Manson Passey. Advised patient that provider had attempted to reach out to her yesterday and informed of below message. Patient still wishes to speak to provider.   To PCP  Veronda Prude, RN

## 2020-10-08 ENCOUNTER — Telehealth: Payer: Self-pay

## 2020-10-08 NOTE — Telephone Encounter (Signed)
[name] husband called requested a call back from Dr. Manson Passey to discuss a referral for a therapist, current therapist no longer accepts pt's insurance.

## 2020-10-09 NOTE — Telephone Encounter (Signed)
Nursing- Please call patient and husband:  Good news: insurance does not require a referral for therapy. I recommend you go online on your phone to: psychologytoday.com Place the 'medicaid' filter in place and this will allow you to find therapist. Nursing- please also mail the family the list below.      Therapy and Counseling Resources Most providers on this list will take Medicaid. Patients with commercial insurance or Medicare should contact their insurance company to get a list of in network providers.  [facility]Day:Psychiatry and Counseling [address] [place], [place] [address] [phone]  [facility]  [address] [place], [place] [address]      [phone]  [facility] [address]  [place], [place] [address] [phone]  [facility] [address]  [place], [place] [address]       [phone]      [facility] [address], [place], [place] [phone]  [facility]:  [address] [place] [phone]  [facility]:  [address] [place] [phone]  [facility] (under & uninsured) [address]   [place] [phone]    [email or web address]    [facility] [address]  [place]    [phone]  [facility] [employer] -[address]     Phone:  [phone]     [facility]-  [address]  [phone]   [facility] [address]      [place], [place] [phone]  [facility]: [address] [place], [place], [place]  [phone]   [facility] (Spanish therapist) [email or web address]  [address] [place]  [address]   [place] [address]    [phone]    [facility]   [address],  [place], [place]  [phone]   [facility]   [address] [place]  [phone]  [facility]  [address] [place], [place]        [phone]  Open Access/Walk In Clinic under & uninsured  [facility]  [address] [place], [place] [phone] Crisis [phone]  [facility],  (Spanish)   [address], [place]: [phone]) 8:30 - 12; 1 - 2:30  [facility] [employer],  [address] [place], [place]    [phone]):8:30 - 12; 2 - 3PM  [facility] [employer],  [address]  [place]; [phone]):   Mon - Fri 8 AM - 5 PM  [facility] [address] [place]  MWF 12:30 to 3:00 or call to schedule an appointment  [phone]  Specific Provider options Psychology Today  [email or web address].psychologytoday.com/us click on find a therapist  enter your zip code left side and select or tailor a therapist for your specific need.   [facility] Provider Directory [email or web address]  (Medicaid)   Follow all drop down to find a provider  Social Support program [facility] [phone] or PhotoSolver.pl [address], [place], [place] Recovery support and educational   24- Hour Availability:   [facility]  [address] [place], [place] [phone] Crisis [phone]  [facility] [employer] [phone]  [employer]  [phone]  [facility] [employer]  [phone] (after hours)  Therapeutic Alternative/Mobile Crisis   [phone]  [place] National Suicide Hotline  [phone] Len Childs)  Call 911 or go to emergency room  [facility]  [phone]);  [place] and [employer]  [phone]); [place], [place], [place], [place], [place], [place], [place]

## 2020-10-09 NOTE — Telephone Encounter (Signed)
Pt informed. Natalie [name], CMA  

## 2020-10-10 ENCOUNTER — Other Ambulatory Visit: Payer: Self-pay

## 2020-10-10 ENCOUNTER — Encounter (HOSPITAL_COMMUNITY): Payer: Self-pay | Admitting: Psychiatry

## 2020-10-10 ENCOUNTER — Emergency Department (HOSPITAL_COMMUNITY)
Admission: EM | Admit: 2020-10-10 | Discharge: 2020-10-10 | Disposition: A | Discharge status: Home / Self Care | Payer: Medicaid Other | Attending: Emergency Medicine | Admitting: Emergency Medicine

## 2020-10-10 DIAGNOSIS — Z5321 Procedure and treatment not carried out due to patient leaving prior to being seen by health care provider: Secondary | ICD-10-CM | POA: Diagnosis not present

## 2020-10-10 DIAGNOSIS — H938X3 Other specified disorders of ear, bilateral: Secondary | ICD-10-CM | POA: Insufficient documentation

## 2020-10-10 NOTE — ED Triage Notes (Signed)
Patient arrived from home with complaints of bilateral ear redness since this morning. Patient wants to know if its from side effects of medications she has been taking. Patient is on Lithium, Cogentin, and Geodon. Patient also wants to have her lithium levels checked.

## 2020-10-14 ENCOUNTER — Telehealth: Payer: Self-pay | Admitting: Family Medicine

## 2020-10-14 NOTE — Telephone Encounter (Signed)
Called patient regarding my chart message from husband.  Patient reports her appetite has been severely reduced.  She reports her mood is worse.  She specifically denies thoughts of hurting herself or others.  She continues to have voices, which she has had for the last 2 months the telling her husband is a bad person.  She snores these but they are becoming persistent.  They did not tell her what to do and they do not tell her to hurt her self.  Recommended and offered that she go to behavioral health for evaluation.  She and her husband both declined.  Recommended that she call her psychiatrist today.  They are to call to see if and get earlier appointment.  Also recommended she look through psychology today to see if she can find a therapist.  We will discuss transitioning to an alternative birth control in January.  Reviewed ED precautions as well as address of behavioral health urgent care.

## 2020-10-15 ENCOUNTER — Other Ambulatory Visit: Payer: Self-pay

## 2020-10-15 ENCOUNTER — Ambulatory Visit: Payer: Medicaid Other | Admitting: Neurology

## 2020-10-15 ENCOUNTER — Encounter: Payer: Self-pay | Admitting: Neurology

## 2020-10-15 VITALS — BP 121/80 | HR 82 | Ht 64.0 in | Wt 178.5 lb

## 2020-10-15 DIAGNOSIS — M79605 Pain in left leg: Secondary | ICD-10-CM

## 2020-10-15 DIAGNOSIS — M79604 Pain in right leg: Secondary | ICD-10-CM | POA: Diagnosis not present

## 2020-10-15 MED ORDER — DULOXETINE HCL 60 MG PO CPEP: 60.0000 mg | ORAL_CAPSULE | Freq: Every day | ORAL | 3 refills | Status: DC

## 2020-10-15 NOTE — Telephone Encounter (Signed)
Patient returns call to nurse line after being evaluated at [facility] Neurology. Patient was prescribed Cymbalta. Patient is requesting that PCP call her to discuss possible side effects and if this is a good medication for her. Advised patient that typically the prescribing doctor would be the best to reach out to regarding these types of questions, as they are the specialist for this.   Patient is still requesting to speak with Dr. Manson Passey as soon as possible to discuss visit and medication management.   To PCP  Veronda Prude, RN

## 2020-10-15 NOTE — Progress Notes (Signed)
Chief Complaint  Patient presents with  . New Patient (Initial Visit)    Arm/Leg/Knee pain  . Room 4    Husband Natalie [name] in room    HISTORICAL  Natalie [name] is a 29 year old female, seen in request by primary care physician Dr. Manson Passey, [place], accompanied by her husband for evaluation of diffuse body achy pain, initial evaluation was on October 15, 2020.  I reviewed and summarized the referring note.PMHx. Bipolar disorder, on polypharmacy treatment, Geodon 40 mg daily, lithium 900 mg at bedtime,  Since 2020, she complains of diffuse body achy pain, in her arms, legs, all day long, as if she got a bone splint, she denies difficulty using her arms and legs, denies sensory changes,  Laboratory evaluation August 2021: Normal CPK, vitamin D level was decreased 17.7, normal BMP, liver functional test, CBC with hemoglobin of 10.3, thyroid functional test,  UDS was negative, lithium level was 0.18  She reported suboptimal control of her mood disorder, with recent medication changes  REVIEW OF SYSTEMS: Full 14 system review of systems performed and notable only for as above All other review of systems were negative.  ALLERGIES: No Active Allergies  HOME MEDICATIONS: Current Outpatient Medications  Medication Sig Dispense Refill  . acetaminophen (TYLENOL) 325 MG tablet Take 650 mg by mouth every 6 (six) hours as needed for mild pain or headache.    . benztropine (COGENTIN) 0.5 MG tablet Take 1 tablet (0.5 mg total) by mouth 2 (two) times daily as needed for tremors. (Patient taking differently: Take 1 mg by mouth at bedtime. ) 60 tablet 0  . docusate sodium (COLACE) 100 MG capsule Take 1 capsule (100 mg total) by mouth 2 (two) times daily. For constipation 60 capsule 0  . lithium carbonate (ESKALITH) 450 MG CR tablet Take 2 tablets (900 mg total) by mouth at bedtime. For mood stabilization 60 tablet 0  . senna (SENOKOT) 8.6 MG TABS tablet Take every other day after iron 120  tablet 0  . senna-docusate (SENOKOT-S) 8.6-50 MG tablet Take 1 tablet by mouth at bedtime. (May buy from over the counter): For constipation 1 tablet 0  . diclofenac Sodium (VOLTAREN) 1 % GEL Apply 4 g topically 4 (four) times daily. Apply to right side of low back 150 g 3  . OLANZapine (ZYPREXA) 15 MG tablet Take 1 tablet (15 mg total) by mouth at bedtime. 30 tablet 0   No current facility-administered medications for this visit.    PAST MEDICAL HISTORY: Past Medical History:  Diagnosis Date  . Anxiety   . Chronic bipolar disorder (HCC)   . Depression   . H/O suicide attempt 05-2013   OD  . Obesity   . OCD (obsessive compulsive disorder)   . Psychiatric diagnosis     PAST SURGICAL HISTORY: History reviewed. No pertinent surgical history.  FAMILY HISTORY: Family History  Problem Relation Age of Onset  . Hypertension Mother   . Depression Father   . Hypertension Father   . Bipolar disorder Father   . Hypertension Maternal Grandmother   . Obesity Maternal Grandmother   . Hypertension Paternal Grandmother     SOCIAL HISTORY: Social History   Socioeconomic History  . Marital status: Married    Spouse name: Natalie [name]  . Number of children: 0  . Years of education: Not on file  . Highest education level: Not on file  Occupational History  . Occupation: unemployed  Tobacco Use  . Smoking status: Never Smoker  . Smokeless  tobacco: Never Used  Vaping Use  . Vaping Use: Never used  Substance and Sexual Activity  . Alcohol use: No  . Drug use: No  . Sexual activity: Yes    Birth control/protection: I.U.D.  Other Topics Concern  . Not on file  Social History Narrative   Lives with husband   Right handed   Drinks caffeine seldomly   Social Determinants of Health   Financial Resource Strain:   . Difficulty of Paying Living Expenses: Not on file  Food Insecurity:   . Worried About Programme researcher, broadcasting/film/video in the Last Year: Not on file  . Ran Out of Food in the Last  Year: Not on file  Transportation Needs:   . Lack of Transportation (Medical): Not on file  . Lack of Transportation (Non-Medical): Not on file  Physical Activity:   . Days of Exercise per Week: Not on file  . Minutes of Exercise per Session: Not on file  Stress:   . Feeling of Stress : Not on file  Social Connections:   . Frequency of Communication with Friends and Family: Not on file  . Frequency of Social Gatherings with Friends and Family: Not on file  . Attends Religious Services: Not on file  . Active Member of Clubs or Organizations: Not on file  . Attends Banker Meetings: Not on file  . Marital Status: Not on file  Intimate Partner Violence:   . Fear of Current or Ex-Partner: Not on file  . Emotionally Abused: Not on file  . Physically Abused: Not on file  . Sexually Abused: Not on file     PHYSICAL EXAM   Vitals:   10/15/20 1011  BP: 121/80  Pulse: 82  Weight: 178 lb 8 oz (81 kg)  Height: 5\' 4"  (1.626 m)   Not recorded     Body mass index is 30.64 kg/m.  PHYSICAL EXAMNIATION:  Gen: NAD, conversant, well nourised, well groomed                     Cardiovascular: Regular rate rhythm, no peripheral edema, warm, nontender. Eyes: Conjunctivae clear without exudates or hemorrhage Neck: Supple, no carotid bruits. Pulmonary: Clear to auscultation bilaterally   NEUROLOGICAL EXAM:  MENTAL STATUS: Depressed looking young female Speech:    Speech is normal; fluent and spontaneous with normal comprehension.  Cognition:     Orientation to time, place and person     Normal recent and remote memory     Normal Attention span and concentration     Normal Language, naming, repeating,spontaneous speech     Fund of knowledge   CRANIAL NERVES: CN II: Visual fields are full to confrontation. Pupils are round equal and briskly reactive to light. CN III, IV, VI: extraocular movement are normal. No ptosis. CN V: Facial sensation is intact to light touch CN  VII: Face is symmetric with normal eye closure  CN VIII: Hearing is normal to causal conversation. CN IX, X: Phonation is normal. CN XI: Head turning and shoulder shrug are intact  MOTOR: There is no pronator drift of out-stretched arms. Muscle bulk and tone are normal. Muscle strength is normal.  REFLEXES: Reflexes are 2+ and symmetric at the biceps, triceps, knees, and ankles. Plantar responses are flexor.  SENSORY: Intact to light touch, pinprick and vibratory sensation are intact in fingers and toes.  COORDINATION: There is no trunk or limb dysmetria noted.  GAIT/STANCE: Need to push-up to get up from  seated position, mildly unsteady, unkempt,   DIAGNOSTIC DATA (LABS, IMAGING, TESTING) - I reviewed patient records, labs, notes, testing and imaging myself where available.   ASSESSMENT AND PLAN  Natalie [name] is a 29 y.o. female   Diffuse body achy pain  Normal neurological examination,  Her complaints happened in the setting of suboptimal control of her mood disorder, most likely related to her depression,  Laboratory evaluation to rule out systemic inflammatory process,  Add on Cymbalta 60 mg daily   Levert Feinstein, M.D. Ph.D.  [facility] [address], [address] [place], [place] [address] Ph: [phone] Fax: [phone]  CC:  Westley Chandler, MD [address] [place],  [place] [address]

## 2020-10-16 LAB — ANA W/REFLEX IF POSITIVE: Anti Nuclear Antibody (ANA): NEGATIVE

## 2020-10-16 LAB — C-REACTIVE PROTEIN: CRP: 36 mg/L — ABNORMAL HIGH (ref 0–10)

## 2020-10-16 LAB — SEDIMENTATION RATE: Sed Rate: 36 mm/hr — ABNORMAL HIGH (ref 0–32)

## 2020-10-16 NOTE — Telephone Encounter (Signed)
Called patient back. As she has a history of bipolar I and multiple questions about SNRI, recommended she speak with Psychiatrist about medication. She is to reach out to office. She also reports a somewhat low mood and 'hearing voices' outsider her home. This is chronic for her. None telling her to hurt herself. No thoughts of SI/HI. She is able to 'tune them out'. Recommended [facility], gave address, patient declined. She will call Psychiatry.  Natalie Starr, MD  Family Medicine Teaching Service

## 2020-10-17 ENCOUNTER — Telehealth: Payer: Self-pay | Admitting: Neurology

## 2020-10-17 DIAGNOSIS — M79605 Pain in left leg: Secondary | ICD-10-CM

## 2020-10-17 DIAGNOSIS — M79604 Pain in right leg: Secondary | ICD-10-CM

## 2020-10-17 NOTE — Telephone Encounter (Signed)
Please call patient, laboratory evaluation showed elevated C-reactive protein, mildly elevated ESR, negative ANA,  Above abnormal laboratory evaluation has unknown clinical significance  Please check if adding on Cymbalta 60 mg has helped or not,  Also ask if she has any signs of infection, such as UTI, upper respiratory infection, if she does, she should contact her primary care physician  Also entered repeat ESR, C-reactive protein, she may return to clinic for repeat laboratory evaluations

## 2020-10-17 NOTE — Addendum Note (Signed)
Addended by: Lindell Spar C on: 10/17/2020 11:43 AM   Modules accepted: Orders

## 2020-10-17 NOTE — Telephone Encounter (Addendum)
I called the patient and provided her with the lab results. Denies any signs or symptoms of any type of infection. States she has transportation issues and is unable to come back for repeat labs. She is not scheduled to follow up here. She would like this information faxed to her PCP, Dr. [name]. She will call Dr. Theora Gianotti office to discuss further work-up. She also plans to discuss an alternate pain medication. She is unable to take duloxetine due to changes in her bipolar medication.

## 2020-10-23 ENCOUNTER — Telehealth: Payer: Self-pay | Admitting: Neurology

## 2020-10-23 NOTE — Telephone Encounter (Signed)
Pt. called to find out what advice or instructions Dr. Terrace Arabia has for pt. to follow concerning her inflammatory problem with lower extremities. Please advise.

## 2020-10-23 NOTE — Telephone Encounter (Signed)
Call with patient from 10/17/20:  called the patient and provided her with the lab results. Denies any signs or symptoms of any type of infection. States she has transportation issues and is unable to come back for repeat labs. She is not scheduled to follow up here. She would like this information faxed to her PCP, Dr. [name]. She will call Dr. Theora Gianotti office to discuss further work-up. She also plans to discuss an alternate pain medication. She is unable to take duloxetine due to changes in her bipolar medication.

## 2020-10-23 NOTE — Telephone Encounter (Signed)
I called the patient back  today to offer the ordered repeat labs. She expressed again that she does not intend to follow up here due to lack of transportation. She was instructed that her lab results were faxed and confirmed to her PCP on 10/17/20. She will need to follow up with them for a determination of next step. She verbalized understanding of this plan.

## 2020-11-04 ENCOUNTER — Telehealth: Payer: Self-pay | Admitting: Family Medicine

## 2020-11-04 NOTE — Telephone Encounter (Signed)
Received message below from patient's husband:    My wife's menstrual cycle has been extremely heavy recently. She was to ask a question about it. Could you call Monday? She is wondering if this is normal.  Also, can you give her a shot for the pain. It is really been a problem  and Tylenol is not helping. Should she make another appointment with the neurologist? Let us know. If it is a pill, she cannot take any more mood medicine other than her bipolar mood meds. Call when you can.  Called patient about above concerns. Discussed options of IUD and pill.   Patient's birthday today, with family. She will review options and let me know.  [name], MD  Family Medicine Teaching Service

## 2020-11-05 ENCOUNTER — Emergency Department (HOSPITAL_COMMUNITY)
Admission: EM | Admit: 2020-11-05 | Discharge: 2020-11-05 | Disposition: A | Discharge status: Home / Self Care | Payer: Medicaid Other | Attending: Emergency Medicine | Admitting: Emergency Medicine

## 2020-11-05 ENCOUNTER — Encounter (HOSPITAL_COMMUNITY): Payer: Self-pay | Admitting: Emergency Medicine

## 2020-11-05 ENCOUNTER — Other Ambulatory Visit: Payer: Self-pay

## 2020-11-05 DIAGNOSIS — Z79899 Other long term (current) drug therapy: Secondary | ICD-10-CM | POA: Diagnosis not present

## 2020-11-05 DIAGNOSIS — R440 Auditory hallucinations: Secondary | ICD-10-CM | POA: Diagnosis not present

## 2020-11-05 DIAGNOSIS — F329 Major depressive disorder, single episode, unspecified: Secondary | ICD-10-CM | POA: Diagnosis present

## 2020-11-05 DIAGNOSIS — F319 Bipolar disorder, unspecified: Secondary | ICD-10-CM | POA: Diagnosis not present

## 2020-11-05 DIAGNOSIS — R41840 Attention and concentration deficit: Secondary | ICD-10-CM | POA: Diagnosis not present

## 2020-11-05 NOTE — ED Notes (Signed)
Pt presents to ED for evaluation of her psych meds. Pt reports they are making feel funny. Pt has been to the desk several times wanting to know how long it was going to take to be seen. Pt denies SI/HI

## 2020-11-05 NOTE — ED Notes (Signed)
Pt requesting to leave. MD notified.

## 2020-11-05 NOTE — ED Notes (Signed)
Pt back to nurses station wanting to know how much longer the wait for TTS

## 2020-11-05 NOTE — Discharge Instructions (Signed)
Your history and exam today are consistent with worsened concentration and ability to focus as well as some worsened auditory hallucinations you are describing.  After a long discussion, I did not feel you had any imminent danger to yourself or others so as we discussed with your spouse, we did not feel you needed involuntary commitment at this time.  However, we did want you to try to see the behavioral health and psychiatry team here tonight but due to the prolonged wait to speak with them, you did not want to wait.  As I do not feel you are a threat to yourself currently, he will be discharged however please consider returning or calling some of the resources provided to get further assistance and to discuss further medication changes.  If any symptoms change or worsen acutely, please return to the nearest emergency department.  Please rest and stay hydrated

## 2020-11-05 NOTE — ED Notes (Addendum)
Pt at nurses station asking how much longer the wait for TTS is. Attempted to call TTS to ask about wait time but no answer.

## 2020-11-05 NOTE — ED Notes (Signed)
Pt refusing discharge vitals.

## 2020-11-05 NOTE — ED Provider Notes (Signed)
[facility] EMERGENCY DEPARTMENT Provider Note   CSN: [id number] Arrival date & time: 11/05/20  1018     History Chief Complaint  Patient presents with  . Mental Health Problem    Natalie [name] is a 29 y.o. female.  The history is provided by the patient and medical records. No language interpreter was used.  Mental Health Problem Presenting symptoms: depression and disorganized thought process   Presenting symptoms: no aggressive behavior, no agitation, no hallucinations, no homicidal ideas, no paranoid behavior, no self-mutilation, no suicidal thoughts, no suicidal threats and no suicide attempt   Degree of incapacity (severity):  Severe Onset quality:  Gradual Duration:  3 weeks Timing:  Constant Progression:  Waxing and waning Chronicity:  New Context: stressful life event (stress at home reported)   Context: not drug abuse, not noncompliant and not recent medication change   Treatment compliance:  Untreated Relieved by:  Nothing Worsened by:  Nothing Ineffective treatments:  None tried Associated symptoms: no chest pain, no fatigue and no headaches   Risk factors: hx of mental illness and hx of suicide attempts (per chart)        Past Medical History:  Diagnosis Date  . Anxiety   . Chronic bipolar disorder (HCC)   . Depression   . H/O suicide attempt 05-2013   OD  . Obesity   . OCD (obsessive compulsive disorder)   . Psychiatric diagnosis     Patient Active Problem List   Diagnosis Date Noted  . Pain in both lower extremities 10/15/2020  . Encounter for initial prescription of injectable contraceptive 06/21/2020  . Acute stress reaction 06/21/2020  . Morbid obesity (HCC) 10/23/2019  . Bipolar 2 disorder (HCC) 08/31/2014  . OCD (obsessive compulsive disorder)     History reviewed. No pertinent surgical history.   OB History    Gravida  0   Para  0   Term  0   Preterm  0   AB  0   Living  0     SAB  0   TAB  0    Ectopic  0   Multiple  0   Live Births              Family History  Problem Relation Age of Onset  . Hypertension Mother   . Depression Father   . Hypertension Father   . Bipolar disorder Father   . Hypertension Maternal Grandmother   . Obesity Maternal Grandmother   . Hypertension Paternal Grandmother     Social History   Tobacco Use  . Smoking status: Never Smoker  . Smokeless tobacco: Never Used  Vaping Use  . Vaping Use: Never used  Substance Use Topics  . Alcohol use: No  . Drug use: No    Home Medications Prior to Admission medications   Medication Sig Start Date End Date Taking? Authorizing Provider  acetaminophen (TYLENOL) 325 MG tablet Take 650 mg by mouth every 6 (six) hours as needed for mild pain or headache.    [provider]  benztropine (COGENTIN) 0.5 MG tablet Take 1 tablet (0.5 mg total) by mouth 2 (two) times daily as needed for tremors. Patient taking differently: Take 1 mg by mouth at bedtime.  07/16/20   Thresa Ross, MD  docusate sodium (COLACE) 100 MG capsule Take 1 capsule (100 mg total) by mouth 2 (two) times daily. For constipation 05/02/20   Armandina Stammer I, NP  lithium carbonate (ESKALITH) 450 MG CR tablet  Take 2 tablets (900 mg total) by mouth at bedtime. For mood stabilization 07/16/20   Thresa Ross, MD  senna (SENOKOT) 8.6 MG TABS tablet Take every other day after iron 07/23/20   Westley Chandler, MD  senna-docusate (SENOKOT-S) 8.6-50 MG tablet Take 1 tablet by mouth at bedtime. (May buy from over the counter): For constipation 05/02/20   Armandina Stammer I, NP  ziprasidone (GEODON) 40 MG capsule Take 40 mg by mouth at bedtime. 09/22/20   [provider]  Oxcarbazepine (TRILEPTAL) 300 MG tablet Take 1 tablet (300 mg total) by mouth daily. For mood stabilization Patient not taking: Reported on 06/21/2020 06/04/20 07/16/20  Thresa Ross, MD  Paliperidone Palmitate (INVEGA SUSTENNA IM) Inject 1 each into the muscle every 30 (thirty)  days.  04/19/19  [provider]  traZODone (DESYREL) 150 MG tablet Take 1 tablet (150 mg total) by mouth at bedtime. For sleep 05/27/20 06/06/20  Thresa Ross, MD    Allergies    Patient has no known allergies.  Review of Systems   Review of Systems  Constitutional: Negative for chills, diaphoresis and fatigue.  HENT: Negative for congestion.   Eyes: Negative for visual disturbance.  Respiratory: Negative for cough, chest tightness and shortness of breath.   Cardiovascular: Negative for chest pain and palpitations.  Gastrointestinal: Negative for abdominal distention, constipation, diarrhea, nausea and rectal pain.  Genitourinary: Negative for flank pain and frequency.  Skin: Negative for rash and wound.  Neurological: Negative for dizziness, weakness, light-headedness and headaches.  Psychiatric/Behavioral: Negative for agitation, confusion, hallucinations, homicidal ideas, paranoia, self-injury, sleep disturbance and suicidal ideas.  All other systems reviewed and are negative.   Physical Exam Updated Vital Signs BP 122/83 (BP Location: Right Arm)   Pulse 93   Temp 98.9 F (37.2 C) (Oral)   Resp 16   SpO2 100%   Physical Exam Vitals and nursing note reviewed.  Constitutional:      General: She is not in acute distress.    Appearance: She is well-developed. She is not ill-appearing, toxic-appearing or diaphoretic.  HENT:     Head: Normocephalic and atraumatic.     Right Ear: External ear normal.     Left Ear: External ear normal.     Nose: Nose normal. No congestion.     Mouth/Throat:     Mouth: Mucous membranes are moist.     Pharynx: No oropharyngeal exudate or posterior oropharyngeal erythema.  Eyes:     Conjunctiva/sclera: Conjunctivae normal.  Cardiovascular:     Rate and Rhythm: Normal rate.     Pulses: Normal pulses.     Heart sounds: No murmur heard.   Pulmonary:     Effort: No respiratory distress.     Breath sounds: No stridor.  Abdominal:      General: There is no distension.     Tenderness: There is no abdominal tenderness. There is no right CVA tenderness, left CVA tenderness, guarding or rebound.  Musculoskeletal:        General: No tenderness.     Cervical back: Neck supple.  Skin:    General: Skin is warm.     Capillary Refill: Capillary refill takes less than 2 seconds.     Coloration: Skin is not pale.     Findings: No erythema or rash.  Neurological:     General: No focal deficit present.     Mental Status: She is alert and oriented to person, place, and time.     Motor: No abnormal  muscle tone.     Deep Tendon Reflexes: Reflexes are normal and symmetric.  Psychiatric:        Attention and Perception: She perceives auditory hallucinations.        Mood and Affect: Mood is depressed. Affect is flat.        Thought Content: Thought content is paranoid (feels people are talking about her). Thought content does not include homicidal or suicidal ideation. Thought content does not include homicidal or suicidal plan.     ED Results / Procedures / Treatments   Labs (all labs ordered are listed, but only abnormal results are displayed) Labs Reviewed - No data to display  EKG None  Radiology No results found.  Procedures Procedures (including critical care time)  Medications Ordered in ED Medications - No data to display  ED Course  I have reviewed the triage vital signs and the nursing notes.  Pertinent labs & imaging results that were available during my care of the patient were reviewed by me and considered in my medical decision making (see chart for details).    MDM Rules/Calculators/A&P                          Dulcy Sida is a 29 y.o. female with a past medical history significant of bipolar disorder, anxiety, depression, and OCD who presents with concerns for lack of focus, "feeling not herself" and some worsened auditory loose Nations.  She reports that this is been ongoing for the last few weeks.   She says that she is taking different medications within the chart shows but she is currently on lithium, Geodon, and the Cogentin.  She is not taking the Zyprexa or Cymbalta that the chart seems to show.  Patient says he is on his medications for quite some time.  She denies any physical complaints including no headaches, chest pain, palpitations, shortness of breath, fevers, chills, cough, nausea, vomiting, urinary changes or GI symptoms.  She simply reports that she is feeling her medications need adjustment and she wants to talk to the psychiatry team for this.  She denies any SI or HI and does say that she has had somewhat auditory loose Nations which has not had in the past.  On exam, lungs are clear and chest is nontender.  Back is nontender.  She denies SI or HI to me.  No report of trauma or any other complaints.  Patient is frustrated with the approximately 5-hour she is been waiting to have a conversation with a provider.  Given her lack of SI or HI, I do not feel she needs IVC.  I do feel that a conversation with TTS and a psychiatrist would benefit her if not to schedule further outpatient follow-up but to help make some medication adjustment.  I will place a TTS call instead of getting all the blood work as I do not feel patient would likely want to stay if we have to get blood work as she is not having any other physical concerns.  Anticipate follow-up on TTS recommendations.     Patient did not want to wait on TTS evaluation.  She waited over 12 and half hours in the emergency department and was unable to speak with psychiatry.  We offered food and she reports that she needed to go.  I still do not feel she is a danger to herself or others at this time.  I spoke to the patient's spouse and he  agreed.  Patient will be discharged and given instructions for multiple resource sites including the behavioral health urgent care.  Patient was encouraged to call and follow-up tomorrow and if she has  any more concerning feelings, to return to the emergency department.  Patient agrees and was discharged in good condition.    Final Clinical Impression(s) / ED Diagnoses Final diagnoses:  Auditory hallucinations  Poor concentration    Rx / DC Orders ED Discharge Orders    None     Clinical Impression: 1. Auditory hallucinations   2. Poor concentration     Disposition: Discharge  Condition: Good  I have discussed the results, Dx and Tx plan with the pt(& family if present). He/she/they expressed understanding and agree(s) with the plan. Discharge instructions discussed at great length. Strict return precautions discussed and pt &/or family have verbalized understanding of the instructions. No further questions at time of discharge.    Discharge Medication List as of 11/05/2020 10:20 PM      Follow Up: an outpatient psychiatry team     [facility] [address] [place] [address] [phone]    [facility] EMERGENCY DEPARTMENT [address] [id number] mc [place] [address] [phone]       Bronsen Serano, Canary Brim, MD 11/05/20 [phone]

## 2020-11-05 NOTE — ED Notes (Signed)
Pt back at nurses station asking how much longer the wait is. Attempted to call TTS X2.

## 2020-11-05 NOTE — ED Triage Notes (Addendum)
Pt arrives to ED via pov and states she would liek to speak to a doctor regarding her bipolar medications, pt will not elaborate any further than this, she states she does have her medication and has been complaint to them. She denies any SI or HI ideations. Pt denies any other issues. Her husband dropped her off and di not come inside

## 2020-11-05 NOTE — Social Work (Signed)
CSW was approached by Pt in hallway. Pt asked if she could speak with CSW.  As CSW attempted to comply/engage with Pt, Pt was unable to explain why she was in ED except that she needs "medication for my condition"  CSW asked both direct and indirect questions in an effort to engage Pt to no avail. Asked Pt if she would like CSW to return, Pt shook her head , no.

## 2020-11-05 NOTE — ED Notes (Signed)
Pt back to nurses station wanting to know how much longer the wait will be

## 2020-11-06 ENCOUNTER — Emergency Department (HOSPITAL_COMMUNITY)
Admission: EM | Admit: 2020-11-06 | Discharge: 2020-11-08 | Disposition: A | Discharge status: Home / Self Care | Payer: Medicaid Other | Attending: Emergency Medicine | Admitting: Emergency Medicine

## 2020-11-06 ENCOUNTER — Other Ambulatory Visit: Payer: Self-pay

## 2020-11-06 ENCOUNTER — Telehealth: Payer: Self-pay | Admitting: Family Medicine

## 2020-11-06 DIAGNOSIS — F419 Anxiety disorder, unspecified: Secondary | ICD-10-CM | POA: Insufficient documentation

## 2020-11-06 DIAGNOSIS — F429 Obsessive-compulsive disorder, unspecified: Secondary | ICD-10-CM | POA: Insufficient documentation

## 2020-11-06 DIAGNOSIS — Z20822 Contact with and (suspected) exposure to covid-19: Secondary | ICD-10-CM | POA: Insufficient documentation

## 2020-11-06 DIAGNOSIS — F319 Bipolar disorder, unspecified: Secondary | ICD-10-CM | POA: Diagnosis present

## 2020-11-06 DIAGNOSIS — F3181 Bipolar II disorder: Secondary | ICD-10-CM | POA: Diagnosis not present

## 2020-11-06 DIAGNOSIS — Z046 Encounter for general psychiatric examination, requested by authority: Secondary | ICD-10-CM | POA: Diagnosis not present

## 2020-11-06 LAB — COMPREHENSIVE METABOLIC PANEL
ALT: 14 U/L (ref 0–44)
AST: 14 U/L — ABNORMAL LOW (ref 15–41)
Albumin: 3.7 g/dL (ref 3.5–5.0)
Alkaline Phosphatase: 64 U/L (ref 38–126)
Anion gap: 9 (ref 5–15)
BUN: 6 mg/dL (ref 6–20)
CO2: 22 mmol/L (ref 22–32)
Calcium: 9.3 mg/dL (ref 8.9–10.3)
Chloride: 109 mmol/L (ref 98–111)
Creatinine, Ser: 0.51 mg/dL (ref 0.44–1.00)
GFR, Estimated: >60 mL/min (ref >60)
Glucose, Bld: 94 mg/dL (ref 70–99)
Potassium: 3.5 mmol/L (ref 3.5–5.1)
Sodium: 140 mmol/L (ref 135–145)
Total Bilirubin: 0.5 mg/dL (ref 0.3–1.2)
Total Protein: 8.1 g/dL (ref 6.5–8.1)

## 2020-11-06 LAB — CBC WITH DIFFERENTIAL/PLATELET
Abs Immature Granulocytes: 0.03 10*3/uL (ref 0.00–0.07)
Basophils Absolute: 0.1 10*3/uL (ref 0.0–0.1)
Basophils Relative: 1 %
Eosinophils Absolute: 0.1 10*3/uL (ref 0.0–0.5)
Eosinophils Relative: 1 %
HCT: 34.4 % — ABNORMAL LOW (ref 36.0–46.0)
Hemoglobin: 10.8 g/dL — ABNORMAL LOW (ref 12.0–15.0)
Immature Granulocytes: 0 %
Lymphocytes Relative: 19 %
Lymphs Abs: 1.7 10*3/uL (ref 0.7–4.0)
MCH: 29 pg (ref 26.0–34.0)
MCHC: 31.4 g/dL (ref 30.0–36.0)
MCV: 92.2 fL (ref 80.0–100.0)
Monocytes Absolute: 0.5 10*3/uL (ref 0.1–1.0)
Monocytes Relative: 6 %
Neutro Abs: 6.5 10*3/uL (ref 1.7–7.7)
Neutrophils Relative %: 73 %
Platelets: 294 10*3/uL (ref 150–400)
RBC: 3.73 MIL/uL — ABNORMAL LOW (ref 3.87–5.11)
RDW: 14.1 % (ref 11.5–15.5)
WBC: 8.8 10*3/uL (ref 4.0–10.5)
nRBC: 0 % (ref 0.0–0.2)

## 2020-11-06 LAB — RESP PANEL BY RT-PCR (FLU A&B, COVID) ARPGX2
Influenza A by PCR: NEGATIVE
Influenza B by PCR: NEGATIVE
SARS Coronavirus 2 by RT PCR: NEGATIVE

## 2020-11-06 LAB — I-STAT BETA HCG BLOOD, ED (MC, WL, AP ONLY): I-stat hCG, quantitative: <5 m[IU]/mL (ref <5)

## 2020-11-06 LAB — ETHANOL: Alcohol, Ethyl (B): <10 mg/dL (ref <10)

## 2020-11-06 LAB — RAPID URINE DRUG SCREEN, HOSP PERFORMED
Amphetamines: NOT DETECTED
Barbiturates: NOT DETECTED
Benzodiazepines: NOT DETECTED
Cocaine: NOT DETECTED
Opiates: NOT DETECTED
Tetrahydrocannabinol: NOT DETECTED

## 2020-11-06 LAB — LITHIUM LEVEL: Lithium Lvl: 0.8 mmol/L (ref 0.60–1.20)

## 2020-11-06 MED ORDER — TRAZODONE HCL 100 MG PO TABS
100.0000 mg | ORAL_TABLET | Freq: Every day | ORAL | Status: DC
  Administered 2020-11-07 (×2): 100 mg via ORAL
  Filled 2020-11-06 (×2): qty 1

## 2020-11-06 MED ORDER — ZIPRASIDONE HCL 20 MG PO CAPS
40.0000 mg | ORAL_CAPSULE | Freq: Every day | ORAL | Status: DC
  Administered 2020-11-07 (×2): 40 mg via ORAL
  Filled 2020-11-06 (×2): qty 2

## 2020-11-06 MED ORDER — SENNOSIDES-DOCUSATE SODIUM 8.6-50 MG PO TABS
1.0000 | ORAL_TABLET | Freq: Every day | ORAL | Status: DC
  Administered 2020-11-07 (×2): 1 via ORAL
  Filled 2020-11-06 (×2): qty 1

## 2020-11-06 MED ORDER — OLANZAPINE 10 MG PO TBDP
10.0000 mg | ORAL_TABLET | Freq: Two times a day (BID) | ORAL | Status: DC | PRN
  Administered 2020-11-07: 10 mg via ORAL
  Filled 2020-11-06: qty 1

## 2020-11-06 MED ORDER — DOCUSATE SODIUM 100 MG PO CAPS
100.0000 mg | ORAL_CAPSULE | Freq: Two times a day (BID) | ORAL | Status: DC
  Administered 2020-11-06 – 2020-11-07 (×3): 100 mg via ORAL
  Filled 2020-11-06 (×4): qty 1

## 2020-11-06 MED ORDER — ACETAMINOPHEN 325 MG PO TABS: 650.0000 mg | ORAL_TABLET | Freq: Four times a day (QID) | ORAL | Status: DC | PRN

## 2020-11-06 MED ORDER — BENZTROPINE MESYLATE 0.5 MG PO TABS: 0.5000 mg | ORAL_TABLET | Freq: Two times a day (BID) | ORAL | Status: DC | PRN

## 2020-11-06 MED ORDER — ZIPRASIDONE MESYLATE 20 MG IM SOLR: 20.0000 mg | Freq: Every day | INTRAMUSCULAR | Status: DC | PRN

## 2020-11-06 MED ORDER — LITHIUM CARBONATE ER 450 MG PO TBCR
900.0000 mg | EXTENDED_RELEASE_TABLET | Freq: Every day | ORAL | Status: DC
  Administered 2020-11-07 (×2): 900 mg via ORAL
  Filled 2020-11-06 (×2): qty 2

## 2020-11-06 NOTE — ED Notes (Signed)
Offered lots of education and explanation about the 2200 medications ordered for the pt. Pt stated that "I don't feel safe taking these medicines". RN provided description of each medication. Pt states, "I am not supposed to take trazodone". However even after discussing that we can take the trazodone out of the medication cup and she could take the other medications, she just shakes her head "no" and repeatedly asks if "she'll have to get an injection is she doesn't take the medication". It is explained to the pt that medication will not be forced on her so long as she does not present any danger to herself or others. Pt asks if the doctor will refuse to see her if she doesn't take the medication. Pt is reassured that a doctor will see her daily regardless of whether she takes her medication or not.

## 2020-11-06 NOTE — ED Triage Notes (Signed)
Pt brought in by [employer] under IVC-  Per officer-  Pt noncompliant with bipolar meds, pt hearing voices.  Pt was seen here previously for same. Pt concerned for safety. Pt calm and cooperative at time of triage.

## 2020-11-06 NOTE — ED Notes (Signed)
Per pt's request, this RN called PJ and discussed with him that pt is recommended for inpatient treatment. Spouse verbalizes concern about pt's poor po intake. It is noted that a meal tray is sitting at pt's bedside table and when asked if she ate dinner she shakes head "no". Informed pt of the conversation this RN held with spouse and she returns to shaking her head "no". Pt was informed that soon this RN will be bringing scheduled nighttime medications but to please contact RN or sitter that is at bedside if she needs anything.

## 2020-11-06 NOTE — ED Notes (Signed)
Pt denies SI/HI at this time.  Pt cooperative at time of triage. Reports compliance with meds, unsure as to why she is here.

## 2020-11-06 NOTE — ED Provider Notes (Signed)
[facility]-EMERGENCY DEPT Provider Note   CSN: [id number] Arrival date & time: 11/06/20  1144     History Chief Complaint  Patient presents with  . IVC    Natalie [name] is a 29 y.o. female.  29 year old female with history of bipolar disorder who presents under IVC was sent by her family.  According to the petition, family is concerned about her safety due to patient being noncompliant with her bipolar medications.  They believe that she has explained to internal stimuli.  All records patient was at [facility] yesterday for psychiatric complaints but left before her evaluation can be completed.  Patient denies any SI or HI.  Denies having any auditory or visual hallucinations.  No recent alcohol or drug use.  She does admit to being noncompliant with her medications.        Past Medical History:  Diagnosis Date  . Anxiety   . Chronic bipolar disorder (HCC)   . Depression   . H/O suicide attempt 05-2013   OD  . Obesity   . OCD (obsessive compulsive disorder)   . Psychiatric diagnosis     Patient Active Problem List   Diagnosis Date Noted  . Pain in both lower extremities 10/15/2020  . Encounter for initial prescription of injectable contraceptive 06/21/2020  . Acute stress reaction 06/21/2020  . Morbid obesity (HCC) 10/23/2019  . Bipolar 2 disorder (HCC) 08/31/2014  . OCD (obsessive compulsive disorder)     No past surgical history on file.   OB History    Gravida  0   Para  0   Term  0   Preterm  0   AB  0   Living  0     SAB  0   TAB  0   Ectopic  0   Multiple  0   Live Births              Family History  Problem Relation Age of Onset  . Hypertension Mother   . Depression Father   . Hypertension Father   . Bipolar disorder Father   . Hypertension Maternal Grandmother   . Obesity Maternal Grandmother   . Hypertension Paternal Grandmother     Social History   Tobacco Use  . Smoking status: Never Smoker   . Smokeless tobacco: Never Used  Vaping Use  . Vaping Use: Never used  Substance Use Topics  . Alcohol use: No  . Drug use: No    Home Medications Prior to Admission medications   Medication Sig Start Date End Date Taking? Authorizing Provider  acetaminophen (TYLENOL) 325 MG tablet Take 650 mg by mouth every 6 (six) hours as needed for mild pain or headache.    [provider]  benztropine (COGENTIN) 0.5 MG tablet Take 1 tablet (0.5 mg total) by mouth 2 (two) times daily as needed for tremors. Patient taking differently: Take 1 mg by mouth at bedtime.  07/16/20   Thresa Ross, MD  docusate sodium (COLACE) 100 MG capsule Take 1 capsule (100 mg total) by mouth 2 (two) times daily. For constipation 05/02/20   Armandina Stammer I, NP  lithium carbonate (ESKALITH) 450 MG CR tablet Take 2 tablets (900 mg total) by mouth at bedtime. For mood stabilization 07/16/20   Thresa Ross, MD  senna (SENOKOT) 8.6 MG TABS tablet Take every other day after iron 07/23/20   Westley Chandler, MD  senna-docusate (SENOKOT-S) 8.6-50 MG tablet Take 1 tablet by mouth at bedtime. (  May buy from over the counter): For constipation 05/02/20   Armandina Stammer I, NP  ziprasidone (GEODON) 40 MG capsule Take 40 mg by mouth at bedtime. 09/22/20   [provider]  Oxcarbazepine (TRILEPTAL) 300 MG tablet Take 1 tablet (300 mg total) by mouth daily. For mood stabilization Patient not taking: Reported on 06/21/2020 06/04/20 07/16/20  Thresa Ross, MD  Paliperidone Palmitate (INVEGA SUSTENNA IM) Inject 1 each into the muscle every 30 (thirty) days.  04/19/19  [provider]  traZODone (DESYREL) 150 MG tablet Take 1 tablet (150 mg total) by mouth at bedtime. For sleep 05/27/20 06/06/20  Thresa Ross, MD    Allergies    Patient has no known allergies.  Review of Systems   Review of Systems  All other systems reviewed and are negative.   Physical Exam Updated Vital Signs BP 124/81 (BP Location: Left Arm)    Pulse 92   Temp 98.3 F (36.8 C) (Oral)   Resp 16   SpO2 100%   Physical Exam Vitals and nursing note reviewed.  Constitutional:      General: She is not in acute distress.    Appearance: Normal appearance. She is well-developed. She is not toxic-appearing.  HENT:     Head: Normocephalic and atraumatic.  Eyes:     General: Lids are normal.     Conjunctiva/sclera: Conjunctivae normal.     Pupils: Pupils are equal, round, and reactive to light.  Neck:     Thyroid: No thyroid mass.     Trachea: No tracheal deviation.  Cardiovascular:     Rate and Rhythm: Normal rate and regular rhythm.     Heart sounds: Normal heart sounds. No murmur heard.  No gallop.   Pulmonary:     Effort: Pulmonary effort is normal. No respiratory distress.     Breath sounds: Normal breath sounds. No stridor. No decreased breath sounds, wheezing, rhonchi or rales.  Abdominal:     General: Bowel sounds are normal. There is no distension.     Palpations: Abdomen is soft.     Tenderness: There is no abdominal tenderness. There is no rebound.  Musculoskeletal:        General: No tenderness. Normal range of motion.     Cervical back: Normal range of motion and neck supple.  Skin:    General: Skin is warm and dry.     Findings: No abrasion or rash.  Neurological:     Mental Status: She is alert and oriented to person, place, and time.     GCS: GCS eye subscore is 4. GCS verbal subscore is 5. GCS motor subscore is 6.     Cranial Nerves: No cranial nerve deficit.     Sensory: No sensory deficit.  Psychiatric:        Attention and Perception: Attention normal.        Mood and Affect: Affect is flat.        Speech: Speech normal.        Behavior: Behavior is withdrawn.        Thought Content: Thought content is paranoid. Thought content does not include homicidal or suicidal ideation.     ED Results / Procedures / Treatments   Labs (all labs ordered are listed, but only abnormal results are  displayed) Labs Reviewed  RESP PANEL BY RT-PCR (FLU A&B, COVID) ARPGX2  ETHANOL  RAPID URINE DRUG SCREEN, HOSP PERFORMED  CBC WITH DIFFERENTIAL/PLATELET  COMPREHENSIVE METABOLIC PANEL  I-STAT BETA HCG BLOOD,  ED ([facility], [facility], [facility] ONLY)    EKG None  Radiology No results found.  Procedures Procedures (including critical care time)  Medications Ordered in ED Medications - No data to display  ED Course  I have reviewed the triage vital signs and the nursing notes.  Pertinent labs & imaging results that were available during my care of the patient were reviewed by me and considered in my medical decision making (see chart for details).    MDM Rules/Calculators/A&P                          Patient will be medically clear for psychiatric referral Final Clinical Impression(s) / ED Diagnoses Final diagnoses:  None    Rx / DC Orders ED Discharge Orders    None       Lorre Nick, MD 11/06/20 1245

## 2020-11-06 NOTE — Progress Notes (Signed)
Pt is visibly agitated, requiring constant redirection

## 2020-11-06 NOTE — ED Notes (Signed)
At shift change pt immediately started verbalizing that she wanted to leave the hospital. She discussed with this RN that she "doesn't belong here" and "she needs to get home". When it was explained to the pt that the counselor and psych NP recommended inpatient treatment, pt began shaking her head. It was explained to her that she was here at this hospital under IVC paperwork and would remain at this hospital this evening until an inpatient facility had a bed available. She continues to shake her head and verbalizes that "I don't belong here". Offered the pt fluids and opportunity to rest. She refuses. Sitter at bedside.

## 2020-11-06 NOTE — ED Notes (Signed)
TTS machine at bedside. 

## 2020-11-06 NOTE — Telephone Encounter (Signed)
Called patient--spoke with husband. He provided update, patient admitted to [facility].  [name], MD  Family Medicine Teaching Service

## 2020-11-06 NOTE — ED Notes (Signed)
Pt changed into burgandy scrubs and wanded by security. Belongings collected and included a pt belonging bag with clothes and a supreme back pack and a black purse.

## 2020-11-06 NOTE — BH Assessment (Addendum)
Assessment Note  Natalie [name] is an 29 y.o. female. Patient with history of Bipolar Disorder, OCD, and Anxiety Disorder. She presents to [facility] with IVC papers in place. The petitioner is her  Natalie [name] [phone]). According to the petition, the spouse is concerned about her safety due to patient being noncompliant with her bipolar medications. She is suspected to display internal stimuli.  Upon chart review, patient was at [facility] yesterday for psychiatric complaints but left before her evaluation can be completed.    Patient explains that she was IVC'd by her spouse whom she calls "PJ". States that her family is over concerned about her. She says that because she has been off her psychotropic  medications for a few days they feel like she needs to be in the hospital. States that she realizes that she needs to be on her medications. She has been having difficulty concentrating and worrying about what people say so she re-started her medications on her on 2 days ago. Patient states, "I just need someone to wright a prescription for the medications and discharge me home". She identifies her medications as Geodon, Lithium, and Benztropine. She is aware of how much she should take and how frequent. She also shared insight on what medications need to be increased in order to help her with sleeping. Patient's medications are written by "Krystle" @ [facility]. Patient states that "Glyn Ade is a NP and she has been seeing her virtually for over a year. She was also participating in therapy but says "things didn't work out so I need a new therapist".   Patient denies any SI. Also, denies a history of SI and/or self mutilating behaviors. Denies HI. No history of assaultive and/or aggressive behaviors. Denies legal issues.  Denies having any auditory or visual hallucinations.  No alcohol or drug use. Denies history of trauma. She does report multiple hospitalizations due to "My Bipolar  symptoms". She does not remember the name of the facilities or dates of hospitalizations.   Diagnosis: Chronic Bipolar Disorder (per medical history), OCD (per medical history), Anxiety Disorder (per medical history)  Past Medical History:  Past Medical History:  Diagnosis Date  . Anxiety   . Chronic bipolar disorder (HCC)   . Depression   . H/O suicide attempt 05-2013   OD  . Obesity   . OCD (obsessive compulsive disorder)   . Psychiatric diagnosis     No past surgical history on file.  Family History:  Family History  Problem Relation Age of Onset  . Hypertension Mother   . Depression Father   . Hypertension Father   . Bipolar disorder Father   . Hypertension Maternal Grandmother   . Obesity Maternal Grandmother   . Hypertension Paternal Grandmother     Social History:  reports that she has never smoked. She has never used smokeless tobacco. She reports that she does not drink alcohol and does not use drugs.  Additional Social History:  Alcohol / Drug Use Pain Medications: see MAR Prescriptions: see MAR Over the Counter: see MAR History of alcohol / drug use?: No history of alcohol / drug abuse Longest period of sobriety (when/how long): NA  CIWA: CIWA-Ar BP: 124/81 Pulse Rate: 92 COWS:    Allergies: No Known Allergies  Home Medications: (Not in a hospital admission)   OB/GYN Status:  No LMP recorded. Patient has had an injection.  General Assessment Data Location of Assessment: [facility] ED TTS Assessment: In system Is this a Tele or Face-to-Face Assessment?: Tele  Assessment Is this an Initial Assessment or a Re-assessment for this encounter?: Initial Assessment Patient Accompanied by::  ([employer]) Language Other than English: No Living Arrangements:  ("I live with PJ"...Marland Kitchen"He is suppose to be my other 1/2") What gender do you identify as?: Female Marital status: Married [place] name:  Renard Hamper) Pregnancy Status: No Living Arrangements: Spouse/significant other Can  pt return to current living arrangement?: Yes Admission Status: Involuntary Is patient capable of signing voluntary admission?: Yes Referral Source: Self/Family/Friend Insurance type:  (Medicaid )  Medical Screening Exam [facility] Walk-in ONLY) Medical Exam completed: No  Crisis Care Plan Living Arrangements: Spouse/significant other Legal Guardian:  (no legal guardian ) Name of Psychiatrist:  ([facility] ) Name of Therapist:  ([facility] (therapist is "Ms. Tia")  Education Status Is patient currently in school?: No Is the patient employed, unemployed or receiving disability?: Receiving disability income  Risk to self with the past 6 months Suicidal Ideation: No Has patient been a risk to self within the past 6 months prior to admission? : No Suicidal Intent: No Has patient had any suicidal intent within the past 6 months prior to admission? : No Is patient at risk for suicide?: No Suicidal Plan?: No Has patient had any suicidal plan within the past 6 months prior to admission? : No Access to Means: No What has been your use of drugs/alcohol within the last 12 months?:  (denies ) Previous Attempts/Gestures: No How many times?:  (0) Other Self Harm Risks:  (denies ) Triggers for Past Attempts: Other (Comment) (no past attempts and/or gestures ) Intentional Self Injurious Behavior: None Family Suicide History: No Recent stressful life event(s):  ("I wanted to get my medications"; "I wanted to feel better") Persecutory voices/beliefs?: No Depression: Yes Depression Symptoms: Loss of interest in usual pleasures, Isolating, Tearfulness Substance abuse history and/or treatment for substance abuse?: No Suicide prevention information given to non-admitted patients: Not applicable  Risk to Others within the past 6 months Homicidal Ideation: No Does patient have any lifetime risk of violence toward others beyond the six months prior to admission? : No Thoughts of Harm to  Others: No Current Homicidal Intent: No Current Homicidal Plan: No Access to Homicidal Means: No Identified Victim:  (n/a) History of harm to others?: No Assessment of Violence: None Noted Violent Behavior Description:  (patient is calm and cooperative ) Does patient have access to weapons?: No Criminal Charges Pending?: No Does patient have a court date: No Is patient on probation?: No  Psychosis Hallucinations: None noted Delusions: None noted  Mental Status Report Appearance/Hygiene: In scrubs Eye Contact: Poor Motor Activity: Freedom of movement Speech: Logical/coherent Level of Consciousness: Alert Mood: Depressed, Sad Affect: Sad, Depressed Anxiety Level: None Thought Processes: Relevant Judgement: Impaired Orientation: Person, Time, Situation, Place Obsessive Compulsive Thoughts/Behaviors: None  Cognitive Functioning Concentration: Normal Memory: Recent Intact, Remote Intact Is patient IDD: No Insight: Good Impulse Control: Good Appetite: Fair Have you had any weight changes? : No Change ("Sometimes I don't eat that much") Sleep: No Change Total Hours of Sleep:  (6-7 hrs ) Vegetative Symptoms: None  ADLScreening [facility] Assessment Services) Patient's cognitive ability adequate to safely complete daily activities?: Yes Patient able to express need for assistance with ADLs?: Yes Independently performs ADLs?: Yes (appropriate for developmental age)  Prior Inpatient Therapy Prior Inpatient Therapy: Yes Prior Therapy Dates:  (patient does not recall dates of hospitalizations) Prior Therapy Facilty/Provider(s):  ("I don't remember because it's been alot of hospitalizations) Reason for Treatment:  (she was  hospitalized for her Bipolar Disorder )  Prior Outpatient Therapy Prior Outpatient Therapy: Yes Prior Therapy Dates:  ("I'm not sure of the last time I spoke to [place]") Prior Therapy Facilty/Provider(s):  ("I see Krystle from [facility]"-psychiatrist  ) Reason for Treatment:  (medication management (virtual appts)) Does patient have an ACCT team?: No Does patient have Intensive In-House Services?  : No Does patient have [facility] services? : No Does patient have P4CC services?: No  ADL Screening (condition at time of admission) Patient's cognitive ability adequate to safely complete daily activities?: Yes Patient able to express need for assistance with ADLs?: Yes Independently performs ADLs?: Yes (appropriate for developmental age)       Abuse/Neglect Assessment (Assessment to be complete while patient is alone) Physical Abuse: Denies Verbal Abuse: Denies Sexual Abuse: Denies Exploitation of patient/patient's resources: Denies Self-Neglect: Denies     Merchant navy officer (For Healthcare) Does Patient Have a Medical Advance Directive?: No          Disposition: Patient meets criteria for inpatient treatment per Marciano Sequin, NP. Disposition Social Worker to seek appropriate placement.  Disposition Initial Assessment Completed for this Encounter: Yes  On Site Evaluation by:   Reviewed with Physician:    Melynda Ripple 11/06/2020 6:50 PM

## 2020-11-06 NOTE — BHH Counselor (Signed)
Numerous attempts to reach Triage staff, no answer.  Also left message for tech to put cart in room.  No response.
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
## 2020-11-06 NOTE — BHH Counselor (Signed)
Cart not picking up.  Have texted attending RN.  Call to Triage unit is not answered.

## 2020-11-06 NOTE — ED Notes (Signed)
TTS machine at bedside. Pt speaking with BH.
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
## 2020-11-07 DIAGNOSIS — F3181 Bipolar II disorder: Secondary | ICD-10-CM

## 2020-11-07 NOTE — Progress Notes (Signed)
This NT unable to measure pts respirations, pt emotional and laying on her side

## 2020-11-07 NOTE — BH Assessment (Addendum)
Patient can come before 7pm or 10am tomorrow   Patient has been accepted to [facility].  Accepting physician is Dr. Neale Burly  Attending Physician will be Dr. Neale Burly  Patient has been assigned to room 319, by [facility] Charge Nurse Demetria.   Call report to [phone].  Representative/Transfer Coordinator is Clarion Mooneyhan  Patient pre-admitted by [facility] Patient Access Ethelene Browns)  [facility] ER Staff Waynetta Sandy, RN) made aware of acceptance.

## 2020-11-07 NOTE — ED Notes (Signed)
Patient not wanting to lay down to sleep. Patient reports her room has "bad juju" patient is anxious and reports she has not had a BM today and that she took colace but still has not had a BM. RN asked patient if she would like some warm prune juice or warm water. Patient elected for warm water. Patient is slumped over in chair and reports she is sleeping 10 minutes at a time off and on. Will continue to monitor.

## 2020-11-07 NOTE — ED Notes (Signed)
Natalie [name] refusing to lie down and sleep up until this time when fatique finally caused her to sleep. Pt assisted to bed without difficulty or adverse event.

## 2020-11-07 NOTE — ED Notes (Signed)
Pt alert this shift. Pt standing at door most of shift. Pt would not rest. Pt flat, dull, blunted. Pt anxious, paranoid. Pt asking same question, pt redirected and reassured.

## 2020-11-07 NOTE — ED Notes (Signed)
Natalie [name] initially refused 2200 medication but agreed to take them at this time.

## 2020-11-07 NOTE — Consult Note (Signed)
[facility] Psych ED Progress Note  11/07/2020 12:48 PM Natalie [name]  MRN:  [id number] Subjective: Patient states "I need to talk to a doctor as soon as possible."  Patient reports "sometimes I do not feel safe in hospitals."   Patient reports she has concerns regarding "going through changes with my assistance."  Patient reports recent stressor includes heavier than normal menstruation and this has triggered stress and caused her to feel "uncomfortable."  Patient assessed by nurse practitioner.  Patient alert and oriented, participates actively in assessment.  Patient reports paranoia related to emergency department.  Patient reports feeling unsafe, discusses paranoid delusion surrounding sitters who may step away from her bedside.  Patient presents with apparent paranoid delusions.  Patient reports she is not sleeping well in the emergency department as she "does not sleep well around other people."  Patient endorses average appetite.  Patient denies suicidal and homicidal ideations.  Patient is followed outpatient by Dr. Aggie Cosier.  Patient reports prior to admission she had not taken her medications x2 days.  Patient attributes involuntary commitment petition related to unmedicated x2 days.  Patient offered support and encouragement.  Discussed treatment plan.   Principal Problem: Bipolar 2 disorder (HCC) Diagnosis:  Principal Problem:   Bipolar 2 disorder (HCC)  Total Time spent with patient: 30 minutes  Past Psychiatric History: Bipolar disorder, obsessive-compulsive disorder, acute stress reaction  Past Medical History:  Past Medical History:  Diagnosis Date  . Anxiety   . Chronic bipolar disorder (HCC)   . Depression   . H/O suicide attempt 05-2013   OD  . Obesity   . OCD (obsessive compulsive disorder)   . Psychiatric diagnosis    No past surgical history on file. Family History:  Family History  Problem Relation Age of Onset  . Hypertension Mother   . Depression Father    . Hypertension Father   . Bipolar disorder Father   . Hypertension Maternal Grandmother   . Obesity Maternal Grandmother   . Hypertension Paternal Grandmother    Family Psychiatric  History: None reported Social History:  Social History   Substance and Sexual Activity  Alcohol Use No     Social History   Substance and Sexual Activity  Drug Use No    Social History   Socioeconomic History  . Marital status: Married    Spouse name: [name]  . Number of children: 0  . Years of education: Not on file  . Highest education level: Not on file  Occupational History  . Occupation: unemployed  Tobacco Use  . Smoking status: Never Smoker  . Smokeless tobacco: Never Used  Vaping Use  . Vaping Use: Never used  Substance and Sexual Activity  . Alcohol use: No  . Drug use: No  . Sexual activity: Yes    Birth control/protection: I.U.D.  Other Topics Concern  . Not on file  Social History Narrative   Lives with husband   Right handed   Drinks caffeine seldomly   Social Determinants of Health   Financial Resource Strain: Not on file  Food Insecurity: Not on file  Transportation Needs: Not on file  Physical Activity: Not on file  Stress: Not on file  Social Connections: Not on file    Sleep: Poor  Appetite:  Poor  Current Medications: Current Facility-Administered Medications  Medication Dose Route Frequency Provider Last Rate Last Admin  . acetaminophen (TYLENOL) tablet 650 mg  650 mg Oral Q6H PRN Lorre Nick, MD      .  benztropine (COGENTIN) tablet 0.5 mg  0.5 mg Oral BID PRN Lorre Nick, MD      . docusate sodium (COLACE) capsule 100 mg  100 mg Oral BID Lorre Nick, MD   100 mg at 11/07/20 0921  . lithium carbonate (ESKALITH) CR tablet 900 mg  900 mg Oral QHS Lorre Nick, MD   900 mg at 11/07/20 0039  . OLANZapine zydis (ZYPREXA) disintegrating tablet 10 mg  10 mg Oral BID PRN Aldean Baker, NP      . senna-docusate (Senokot-S) tablet 1 tablet  1  tablet Oral QHS Lorre Nick, MD   1 tablet at 11/07/20 0039  . traZODone (DESYREL) tablet 100 mg  100 mg Oral QHS Aldean Baker, NP   100 mg at 11/07/20 0039  . ziprasidone (GEODON) capsule 40 mg  40 mg Oral QHS Lorre Nick, MD   40 mg at 11/07/20 0039  . ziprasidone (GEODON) injection 20 mg  20 mg Intramuscular Daily PRN Aldean Baker, NP       Current Outpatient Medications  Medication Sig Dispense Refill  . benztropine (COGENTIN) 0.5 MG tablet Take 1 tablet (0.5 mg total) by mouth 2 (two) times daily as needed for tremors. (Patient taking differently: Take 1 mg by mouth at bedtime. ) 60 tablet 0  . lithium carbonate (ESKALITH) 450 MG CR tablet Take 2 tablets (900 mg total) by mouth at bedtime. For mood stabilization (Patient taking differently: Take 900 mg by mouth at bedtime. ) 60 tablet 0  . senna (SENOKOT) 8.6 MG TABS tablet Take every other day after iron (Patient taking differently: Take 1-2 tablets by mouth daily as needed for mild constipation. ) 120 tablet 0  . ziprasidone (GEODON) 40 MG capsule Take 40 mg by mouth at bedtime.    . docusate sodium (COLACE) 100 MG capsule Take 1 capsule (100 mg total) by mouth 2 (two) times daily. For constipation (Patient not taking: Reported on 11/06/2020) 60 capsule 0  . senna-docusate (SENOKOT-S) 8.6-50 MG tablet Take 1 tablet by mouth at bedtime. (May buy from over the counter): For constipation (Patient not taking: Reported on 11/06/2020) 1 tablet 0    Lab Results:  Results for orders placed or performed during the hospital encounter of 11/06/20 (from the past 48 hour(s))  Rapid urine drug screen (hospital performed)     Status: None   Collection Time: 11/06/20 12:54 PM  Result Value Ref Range   Opiates NONE DETECTED NONE DETECTED   Cocaine NONE DETECTED NONE DETECTED   Benzodiazepines NONE DETECTED NONE DETECTED   Amphetamines NONE DETECTED NONE DETECTED   Tetrahydrocannabinol NONE DETECTED NONE DETECTED   Barbiturates NONE DETECTED  NONE DETECTED    Comment: (NOTE) DRUG SCREEN FOR MEDICAL PURPOSES ONLY.  IF CONFIRMATION IS NEEDED FOR ANY PURPOSE, NOTIFY LAB WITHIN 5 DAYS.  LOWEST DETECTABLE LIMITS FOR URINE DRUG SCREEN Drug Class                     Cutoff (ng/mL) Amphetamine and metabolites    1000 Barbiturate and metabolites    200 Benzodiazepine                 200 Tricyclics and metabolites     300 Opiates and metabolites        300 Cocaine and metabolites        300 THC  50 Performed at [facility], [address], [place], [place] [address]   Ethanol     Status: None   Collection Time: 11/06/20  1:14 PM  Result Value Ref Range   Alcohol, Ethyl (B) <10 <10 mg/dL    Comment: (NOTE) Lowest detectable limit for serum alcohol is 10 mg/dL.  For medical purposes only. Performed at [facility], [address], [place], [place] [address]   CBC with Differential/Platelet     Status: Abnormal   Collection Time: 11/06/20  1:14 PM  Result Value Ref Range   WBC 8.8 4.0 - 10.5 K/uL   RBC 3.73 (L) 3.87 - 5.11 MIL/uL   Hemoglobin 10.8 (L) 12.0 - 15.0 g/dL   HCT 09.8 (L) 11.9 - 14.7 %   MCV 92.2 80.0 - 100.0 fL   MCH 29.0 26.0 - 34.0 pg   MCHC 31.4 30.0 - 36.0 g/dL   RDW 82.9 56.2 - 13.0 %   Platelets 294 150 - 400 K/uL   nRBC 0.0 0.0 - 0.2 %   Neutrophils Relative % 73 %   Neutro Abs 6.5 1.7 - 7.7 K/uL   Lymphocytes Relative 19 %   Lymphs Abs 1.7 0.7 - 4.0 K/uL   Monocytes Relative 6 %   Monocytes Absolute 0.5 0.1 - 1.0 K/uL   Eosinophils Relative 1 %   Eosinophils Absolute 0.1 0.0 - 0.5 K/uL   Basophils Relative 1 %   Basophils Absolute 0.1 0.0 - 0.1 K/uL   Immature Granulocytes 0 %   Abs Immature Granulocytes 0.03 0.00 - 0.07 K/uL    Comment: Performed at [facility], [address], [place], [place] [address]  Comprehensive metabolic panel     Status: Abnormal   Collection Time: 11/06/20  1:14 PM  Result Value  Ref Range   Sodium 140 135 - 145 mmol/L   Potassium 3.5 3.5 - 5.1 mmol/L   Chloride 109 98 - 111 mmol/L   CO2 22 22 - 32 mmol/L   Glucose, Bld 94 70 - 99 mg/dL    Comment: Glucose reference range applies only to samples taken after fasting for at least 8 hours.   BUN 6 6 - 20 mg/dL   Creatinine, Ser 4.69 0.44 - 1.00 mg/dL   Calcium 9.3 8.9 - 62.9 mg/dL   Total Protein 8.1 6.5 - 8.1 g/dL   Albumin 3.7 3.5 - 5.0 g/dL   AST 14 (L) 15 - 41 U/L   ALT 14 0 - 44 U/L   Alkaline Phosphatase 64 38 - 126 U/L   Total Bilirubin 0.5 0.3 - 1.2 mg/dL   GFR, Estimated >52 >84 mL/min    Comment: (NOTE) Calculated using the CKD-EPI Creatinine Equation (2021)    Anion gap 9 5 - 15    Comment: Performed at [facility], [address], [place], [place] [address]  Lithium level     Status: None   Collection Time: 11/06/20  1:14 PM  Result Value Ref Range   Lithium Lvl 0.80 0.60 - 1.20 mmol/L    Comment: Performed at [facility], [address] [place], [place] [address]  I-Stat beta hCG blood, ED     Status: None   Collection Time: 11/06/20  1:28 PM  Result Value Ref Range   I-stat hCG, quantitative <5.0 <5 mIU/mL   Comment 3            Comment:   GEST. AGE  CONC.  (mIU/mL)   <=1 WEEK        5 - 50     2 WEEKS       50 - 500     3 WEEKS       100 - 10,000     4 WEEKS     1,000 - 30,000        FEMALE AND NON-PREGNANT FEMALE:     LESS THAN 5 mIU/mL   Resp Panel by RT-PCR (Flu A&B, Covid) Nasopharyngeal Swab     Status: None   Collection Time: 11/06/20  5:41 PM   Specimen: Nasopharyngeal Swab; Nasopharyngeal(NP) swabs in vial transport medium  Result Value Ref Range   SARS Coronavirus 2 by RT PCR NEGATIVE NEGATIVE    Comment: (NOTE) SARS-CoV-2 target nucleic acids are NOT DETECTED.  The SARS-CoV-2 RNA is generally detectable in upper respiratory specimens during the acute phase of infection. The lowest concentration of SARS-CoV-2 viral copies this  assay can detect is 138 copies/mL. A negative result does not preclude SARS-Cov-2 infection and should not be used as the sole basis for treatment or other patient management decisions. A negative result may occur with  improper specimen collection/handling, submission of specimen other than nasopharyngeal swab, presence of viral mutation(s) within the areas targeted by this assay, and inadequate number of viral copies(<138 copies/mL). A negative result must be combined with clinical observations, patient history, and epidemiological information. The expected result is Negative.  Fact Sheet for Patients:  BloggerCourse.com  Fact Sheet for Healthcare Providers:  SeriousBroker.it  This test is no t yet approved or cleared by the [place] FDA and  has been authorized for detection and/or diagnosis of SARS-CoV-2 by FDA under an Emergency Use Authorization (EUA). This EUA will remain  in effect (meaning this test can be used) for the duration of the COVID-19 declaration under Section 564(b)(1) of the Act, 21 U.S.C.section 360bbb-3(b)(1), unless the authorization is terminated  or revoked sooner.       Influenza A by PCR NEGATIVE NEGATIVE   Influenza B by PCR NEGATIVE NEGATIVE    Comment: (NOTE) The Xpert Xpress SARS-CoV-2/FLU/RSV plus assay is intended as an aid in the diagnosis of influenza from Nasopharyngeal swab specimens and should not be used as a sole basis for treatment. Nasal washings and aspirates are unacceptable for Xpert Xpress SARS-CoV-2/FLU/RSV testing.  Fact Sheet for Patients: BloggerCourse.com  Fact Sheet for Healthcare Providers: SeriousBroker.it  This test is not yet approved or cleared by the [place] FDA and has been authorized for detection and/or diagnosis of SARS-CoV-2 by FDA under an Emergency Use Authorization (EUA). This EUA will remain in  effect (meaning this test can be used) for the duration of the COVID-19 declaration under Section 564(b)(1) of the Act, 21 U.S.C. section 360bbb-3(b)(1), unless the authorization is terminated or revoked.  Performed at [facility], [address], [place], [place] [address]     Blood Alcohol level:  Lab Results  Component Value Date   ETH <10 11/06/2020   ETH <10 06/30/2020    Physical Findings: AIMS:  , ,  ,  ,    CIWA:    COWS:     Musculoskeletal: Strength & Muscle Tone: within normal limits Gait & Station: normal Patient leans: N/A  Psychiatric Specialty Exam: Physical Exam Vitals and nursing note reviewed.  Constitutional:      Appearance: She is well-developed.  HENT:     Head: Normocephalic.  Cardiovascular:  Rate and Rhythm: Normal rate.  Pulmonary:     Effort: Pulmonary effort is normal.  Neurological:     Mental Status: She is alert and oriented to person, place, and time.  Psychiatric:        Attention and Perception: Attention normal.        Mood and Affect: Mood is anxious.        Speech: Speech is delayed.        Behavior: Behavior is cooperative.        Thought Content: Thought content is paranoid and delusional.        Cognition and Memory: Cognition normal.        Judgment: Judgment is inappropriate.     Review of Systems  Constitutional: Negative.   HENT: Negative.   Eyes: Negative.   Respiratory: Negative.   Cardiovascular: Negative.   Gastrointestinal: Negative.   Genitourinary: Negative.   Musculoskeletal: Negative.   Skin: Negative.   Neurological: Negative.   Psychiatric/Behavioral: Positive for dysphoric mood and sleep disturbance. The patient is nervous/anxious.     Blood pressure 105/61, pulse 91, temperature 98.6 F (37 C), temperature source Oral, resp. rate 16, height 5\' 4"  (1.626 m), weight 80.7 kg, SpO2 100 %.Body mass index is 30.55 kg/m.  General Appearance: Casual  Eye Contact:  Fair  Speech:   Clear and Coherent  Volume:  Normal  Mood:  Anxious  Affect:  Congruent  Thought Process:  Coherent, Goal Directed and Descriptions of Associations: Intact  Orientation:  Full (Time, Place, and Person)  Thought Content:  Delusions, Paranoid Ideation and Tangential  Suicidal Thoughts:  No  Homicidal Thoughts:  No  Memory:  Immediate;   Good Recent;   Good Remote;   Good  Judgement:  Impaired  Insight:  Lacking  Psychomotor Activity:  Normal  Concentration:  Concentration: Good and Attention Span: Good  Recall:  Good  Fund of Knowledge:  Good  Language:  Good  Akathisia:  No  Handed:  Right  AIMS (if indicated):     Assets:  Communication Skills Desire for Improvement Financial Resources/Insurance Housing Intimacy Leisure Time Physical Health Resilience Social Support  ADL's:  Intact  Cognition:  WNL  Sleep:         Treatment Plan Summary: Patient reviewed with Dr. . Daily contact with patient to assess and evaluate symptoms and progress in treatment   Current medications: -Benztropine 0.5 mg twice daily as needed/triggers -Lithium carbonate CR 900 mg nightly -Trazodone 100 mg nightly -Geodon 40 mg nightly -Olanzapine Zydis 10 mg twice daily as needed/agitation  Continue to recommend inpatient psychiatric treatment.  Nelly Rout, FNP 11/07/2020, 12:48 PM

## 2020-11-07 NOTE — Progress Notes (Signed)
Pt has been redirected this entire shift. This NT witnessed pt almost falling forward. Staff assisted pt to bed where she rests with both eyes closed for 10 minutes at a time but continues to get back up

## 2020-11-07 NOTE — ED Notes (Signed)
Natalie [name] is very focused on discharge and repeatedly questions staff about discharge date.

## 2020-11-08 ENCOUNTER — Inpatient Hospital Stay
Admission: RE | Admit: 2020-11-08 | Discharge: 2020-11-20 | DRG: 885 | Disposition: A | Discharge status: Home / Self Care | Payer: Medicaid Other | Source: Intra-hospital | Attending: Behavioral Health | Admitting: Behavioral Health

## 2020-11-08 ENCOUNTER — Encounter: Payer: Self-pay | Admitting: Psychiatry

## 2020-11-08 ENCOUNTER — Other Ambulatory Visit: Payer: Self-pay

## 2020-11-08 DIAGNOSIS — F312 Bipolar disorder, current episode manic severe with psychotic features: Secondary | ICD-10-CM | POA: Diagnosis present

## 2020-11-08 DIAGNOSIS — Z9114 Patient's other noncompliance with medication regimen: Secondary | ICD-10-CM | POA: Diagnosis not present

## 2020-11-08 DIAGNOSIS — G4700 Insomnia, unspecified: Secondary | ICD-10-CM | POA: Diagnosis present

## 2020-11-08 DIAGNOSIS — K5900 Constipation, unspecified: Secondary | ICD-10-CM | POA: Diagnosis present

## 2020-11-08 DIAGNOSIS — F429 Obsessive-compulsive disorder, unspecified: Secondary | ICD-10-CM | POA: Diagnosis present

## 2020-11-08 DIAGNOSIS — Z818 Family history of other mental and behavioral disorders: Secondary | ICD-10-CM

## 2020-11-08 DIAGNOSIS — Z79899 Other long term (current) drug therapy: Secondary | ICD-10-CM

## 2020-11-08 DIAGNOSIS — Z9151 Personal history of suicidal behavior: Secondary | ICD-10-CM

## 2020-11-08 DIAGNOSIS — F419 Anxiety disorder, unspecified: Secondary | ICD-10-CM | POA: Diagnosis present

## 2020-11-08 MED ORDER — ZIPRASIDONE HCL 40 MG PO CAPS
40.0000 mg | ORAL_CAPSULE | Freq: Every day | ORAL | Status: DC
  Administered 2020-11-09: 21:00:00 40 mg via ORAL
  Filled 2020-11-08 (×2): qty 1

## 2020-11-08 MED ORDER — LITHIUM CARBONATE ER 450 MG PO TBCR
900.0000 mg | EXTENDED_RELEASE_TABLET | Freq: Every day | ORAL | Status: DC
  Administered 2020-11-08 – 2020-11-19 (×12): 900 mg via ORAL
  Filled 2020-11-08 (×12): qty 2

## 2020-11-08 MED ORDER — ALUM & MAG HYDROXIDE-SIMETH 200-200-20 MG/5ML PO SUSP: 30.0000 mL | ORAL | Status: DC | PRN

## 2020-11-08 MED ORDER — ADULT MULTIVITAMIN W/MINERALS CH
1.0000 | ORAL_TABLET | Freq: Every day | ORAL | Status: DC
  Administered 2020-11-10 – 2020-11-20 (×11): 1 via ORAL
  Filled 2020-11-08 (×15): qty 1

## 2020-11-08 MED ORDER — ZIPRASIDONE MESYLATE 20 MG IM SOLR: 20.0000 mg | Freq: Every day | INTRAMUSCULAR | Status: DC | PRN

## 2020-11-08 MED ORDER — TRAZODONE HCL 100 MG PO TABS: 100.0000 mg | ORAL_TABLET | Freq: Every day | ORAL | Status: DC

## 2020-11-08 MED ORDER — ACETAMINOPHEN 325 MG PO TABS: 650.0000 mg | ORAL_TABLET | Freq: Four times a day (QID) | ORAL | Status: DC | PRN

## 2020-11-08 MED ORDER — DOCUSATE SODIUM 100 MG PO CAPS
100.0000 mg | ORAL_CAPSULE | Freq: Two times a day (BID) | ORAL | Status: DC
  Administered 2020-11-08 – 2020-11-20 (×19): 100 mg via ORAL
  Filled 2020-11-08 (×21): qty 1

## 2020-11-08 MED ORDER — MAGNESIUM HYDROXIDE 400 MG/5ML PO SUSP
30.0000 mL | Freq: Every day | ORAL | Status: DC | PRN
  Administered 2020-11-10 – 2020-11-19 (×5): 30 mL via ORAL
  Filled 2020-11-08 (×5): qty 30

## 2020-11-08 MED ORDER — BENZTROPINE MESYLATE 1 MG PO TABS: 0.5000 mg | ORAL_TABLET | Freq: Two times a day (BID) | ORAL | Status: DC | PRN

## 2020-11-08 MED ORDER — SENNOSIDES-DOCUSATE SODIUM 8.6-50 MG PO TABS
1.0000 | ORAL_TABLET | Freq: Every day | ORAL | Status: DC
  Administered 2020-11-08 – 2020-11-10 (×3): 1 via ORAL
  Filled 2020-11-08 (×3): qty 1

## 2020-11-08 MED ORDER — BENZTROPINE MESYLATE 1 MG PO TABS
1.0000 mg | ORAL_TABLET | Freq: Every day | ORAL | Status: DC
  Administered 2020-11-08 – 2020-11-19 (×12): 1 mg via ORAL
  Filled 2020-11-08 (×12): qty 1

## 2020-11-08 MED ORDER — BENZTROPINE MESYLATE 1 MG PO TABS: 0.5000 mg | ORAL_TABLET | Freq: Every day | ORAL | Status: DC

## 2020-11-08 MED ORDER — ENSURE ENLIVE PO LIQD
237.0000 mL | Freq: Two times a day (BID) | ORAL | Status: DC
  Administered 2020-11-11 – 2020-11-20 (×13): 237 mL via ORAL

## 2020-11-08 MED ORDER — OLANZAPINE 5 MG PO TBDP
10.0000 mg | ORAL_TABLET | Freq: Two times a day (BID) | ORAL | Status: DC | PRN
  Administered 2020-11-10: 18:00:00 10 mg via ORAL
  Filled 2020-11-08 (×2): qty 2

## 2020-11-08 NOTE — Progress Notes (Signed)
Recreation Therapy Notes  INPATIENT RECREATION THERAPY ASSESSMENT  Patient Details Name: Natalie [name] MRN: [id number] DOB: [birth year] Today's Date: 11/08/2020       Information Obtained From: Patient  Able to Participate in Assessment/Interview: Yes  Patient Presentation: Responsive  Reason for Admission (Per Patient): Active Symptoms  Patient Stressors:    Coping Skills:   Music  Leisure Interests (2+):  Music - Listen,Music - Singing  Frequency of Recreation/Participation: Weekly  Awareness of Community Resources:     Community Resources:     Current Use:    If no, Barriers?:    Expressed Interest in [employer] Information:    [place] of Residence:  [place]  Patient Main Form of Transportation: Taxi  Patient Strengths:  N/A  Patient Identified Areas of Improvement:  Not talking so much  Patient Goal for Hospitalization:  To feel better about myself and better myself.  Current SI (including self-harm):  No  Current HI:  No  Current AVH: No  Staff Intervention Plan: Group Attendance,Collaborate with Interdisciplinary Treatment Team  Consent to Intern Participation: N/A  Natalie [name] 11/08/2020, 3:06 PM

## 2020-11-08 NOTE — Plan of Care (Signed)
°  Problem: Education: °Goal: Knowledge of [place] [facility] Education information/materials will improve °Outcome: Not Progressing °Goal: Emotional status will improve °Outcome: Not Progressing °Goal: Mental status will improve °Outcome: Not Progressing °Goal: Verbalization of understanding the information provided will improve °Outcome: Not Progressing °  °

## 2020-11-08 NOTE — BH Assessment (Signed)
BHH Assessment Progress Note  Per Berneice Heinrich, NP, this pt requires psychiatric hospitalization.  Raelyn Mora, LCSWA reports that pt has been accepted to [facility] by Dr Neale Burly to Rm 319.  Pt presents under IVC initiated by her spouse and upheld by EDP Lorre Nick, MD, and IVC documents have been faxed to [phone].  EDP Mancel Bale, MD and pt's nurse, Diane, have been notified, and Diane agrees to call report to [phone].  Pt is to be transported via [facility].   Doylene Canning, [place] Behavioral Health Coordinator [phone]

## 2020-11-08 NOTE — ED Notes (Signed)
Report given to [employer] at [facility].

## 2020-11-08 NOTE — Progress Notes (Signed)
Recreation Therapy Notes  INPATIENT RECREATION TR PLAN  Patient Details Name: Natalie [name] MRN: [id number] DOB: 10/12/1991 Today's Date: 11/08/2020  Rec Therapy Plan Is patient appropriate for Therapeutic Recreation?: Yes Treatment times per week: at least 3 Estimated Length of Stay: 5-7 days TR Treatment/Interventions: Group participation (Comment)  Discharge Criteria Pt will be discharged from therapy if:: Discharged Treatment plan/goals/alternatives discussed and agreed upon by:: Patient/family  Discharge Summary     [name] 11/08/2020, 3:07 PM

## 2020-11-08 NOTE — [facility] Suicide Risk Assessment (Signed)
[facility] Admission Suicide Risk Assessment   Nursing information obtained from:    Demographic factors:    Current Mental Status:    Loss Factors:    Historical Factors:    Risk Reduction Factors:     Total Time spent with patient: 45 minutes Principal Problem: Bipolar affective disorder, current episode manic with psychotic symptoms (HCC) Diagnosis:  Principal Problem:   Bipolar affective disorder, current episode manic with psychotic symptoms (HCC) Active Problems:   OCD (obsessive compulsive disorder)  Subjective Data: Natalie [name] is a 29 year old female with history of bipolar affective disorder, OCD, and GAD who presented to outside hospital with IVC papers in place. Per IVC paperwork, patient off her medications, responding to internal stimuli, speaking to others that were not there, and fearing that someone is trying to rape her.   Upon approaching patient's room on the unit, she is seen standing in the dark staring at the wall and appeared to be mumbling to unknown person. Once I knocked on her door she quickly came out, and asked to speak in private. She remains guarded during exam with minimal eye contact, and exhibits thought blocking. She states she is very afraid right now because of what has happened to her. She mentions IVC papers have been taken out on her numerous times, and she does not like to be in the hospital. She also fears that staff may try to harm her. She feels she was given too many medications and "blacked out" at the other hospital. However, she does have some insight into her mental illness and medication regimen. She is able to say her correct medication list of Geodon 40 mg at bedtime, Lithium 900 mg at bedtime, and cogentin 1 mg at bedtime. She states she does not take trazodone, and feels this is the one that made her "black out" at the other hospital. We reviewed her medication list at this time. Patient remains guarded about her spouse "PJ" as well. She  mentions wanting to speak to him, but is unsure of his phone number. Upon offering to give her the number we have in the chart, or to help her call him today she becomes very guarded, and asks [place] to "leave it alone."    Continued Clinical Symptoms:    The "Alcohol Use Disorders Identification Test", Guidelines for Use in Primary Care, Second Edition.  World Science writer [facility]). Score between 0-7:  no or low risk or alcohol related problems. Score between 8-15:  moderate risk of alcohol related problems. Score between 16-19:  high risk of alcohol related problems. Score 20 or above:  warrants further diagnostic evaluation for alcohol dependence and treatment.   CLINICAL FACTORS:   Severe Anxiety and/or Agitation Bipolar Disorder:   Mixed State Schizophrenia:   Paranoid or undifferentiated type Currently Psychotic Previous Psychiatric Diagnoses and Treatments   Musculoskeletal: Strength & Muscle Tone: within normal limits Gait & Station: normal Patient leans: N/A  Psychiatric Specialty Exam: Physical Exam Vitals and nursing note reviewed.  Constitutional:      Appearance: Normal appearance.  HENT:     Head: Normocephalic and atraumatic.     Right Ear: External ear normal.     Left Ear: External ear normal.     Nose: Nose normal.     Mouth/Throat:     Mouth: Mucous membranes are moist.     Pharynx: Oropharynx is clear.  Eyes:     Extraocular Movements: Extraocular movements intact.     Conjunctiva/sclera: Conjunctivae normal.  Pupils: Pupils are equal, round, and reactive to light.  Cardiovascular:     Rate and Rhythm: Normal rate.     Pulses: Normal pulses.  Pulmonary:     Effort: Pulmonary effort is normal.     Breath sounds: Normal breath sounds.  Abdominal:     General: Abdomen is flat.     Palpations: Abdomen is soft.  Musculoskeletal:        General: No swelling. Normal range of motion.     Cervical back: Normal range of motion and neck supple.   Skin:    General: Skin is warm and dry.  Neurological:     General: No focal deficit present.     Mental Status: She is alert and oriented to person, place, and time.  Psychiatric:        Attention and Perception: She is inattentive.        Mood and Affect: Mood is depressed. Affect is blunt.        Speech: Speech is delayed.        Behavior: Behavior is withdrawn.        Thought Content: Thought content is paranoid and delusional.        Cognition and Memory: Cognition is impaired. Memory is impaired.        Judgment: Judgment is inappropriate.     Review of Systems  Constitutional: Negative for appetite change and fatigue.  HENT: Negative for rhinorrhea and sore throat.   Eyes: Negative for photophobia and visual disturbance.  Respiratory: Negative for cough and shortness of breath.   Cardiovascular: Negative for chest pain and palpitations.  Gastrointestinal: Negative for constipation, diarrhea, nausea and vomiting.  Endocrine: Negative for cold intolerance and heat intolerance.  Genitourinary: Negative for difficulty urinating and dysuria.  Musculoskeletal: Negative for arthralgias and myalgias.  Skin: Negative for rash and wound.  Allergic/Immunologic: Negative for environmental allergies and food allergies.  Neurological: Negative for dizziness and headaches.  Hematological: Negative for adenopathy. Does not bruise/bleed easily.  Psychiatric/Behavioral: Positive for behavioral problems, confusion, decreased concentration, dysphoric mood and sleep disturbance. Negative for suicidal ideas.    There were no vitals taken for this visit.There is no height or weight on file to calculate BMI.  General Appearance: Guarded  Eye Contact:  Minimal  Speech:  Blocked and Slow  Volume:  Decreased  Mood:  Depressed  Affect:  Constricted and Inappropriate  Thought Process:  Coherent  Orientation:  Full (Time, Place, and Person)  Thought Content:  Delusions, Paranoid Ideation and  Rumination  Suicidal Thoughts:  No  Homicidal Thoughts:  No  Memory:  Immediate;   Fair Recent;   Fair Remote;   Poor  Judgement:  Impaired  Insight:  Shallow  Psychomotor Activity:  Normal  Concentration:  Concentration: Fair and Attention Span: Poor  Recall:  Poor  Fund of Knowledge:  Poor  Language:  Poor  Akathisia:  Negative  Handed:  Right  AIMS (if indicated):     Assets:  Desire for Improvement Financial Resources/Insurance Housing Physical Health Resilience Social Support  ADL's:  Intact  Cognition:  Impaired,  Mild  Sleep:         COGNITIVE FEATURES THAT CONTRIBUTE TO RISK:  Closed-mindedness and Loss of executive function    SUICIDE RISK:   Mild:  Suicidal ideation of limited frequency, intensity, duration, and specificity.  There are no identifiable plans, no associated intent, mild dysphoria and related symptoms, good self-control (both objective and subjective assessment), few other risk factors,  and identifiable protective factors, including available and accessible social support.  PLAN OF CARE: 29 year old female with bipolar affective disorder, current episode manic with psychotic symptoms. Resume Lithium 900 mg QHS, Geodon 40 mg QHS, and Cogentin 1 mg QHS that patient previously stabilized on.   I certify that inpatient services furnished can reasonably be expected to improve the patient's condition.   Jesse Sans, MD 11/08/2020, 1:57 PM

## 2020-11-08 NOTE — ED Provider Notes (Signed)
Emergency Medicine Observation Re-evaluation Note  Natalie [name] is a 29 y.o. female, seen on rounds today.  Pt initially presented to the ED for complaints of IVC Currently, the patient is standing in the doorway of her room, calmly.  Physical Exam  BP 118/79 (BP Location: Left Arm)   Pulse (!) 113   Temp 98.4 F (36.9 C) (Oral)   Resp 16   Ht 5\' 4"  (1.626 m)   Wt 80.7 kg   SpO2 97%   BMI 30.55 kg/m  Physical Exam General: Overweight Cardiac: Tachycardic Lungs: No respiratory distress Psych: Withdrawn  ED Course / MDM  EKG:EKG Interpretation  Date/Time:  Wednesday November 06 2020 18:41:15 EST Ventricular Rate:  94 PR Interval:  130 QRS Duration: 80 QT Interval:  334 QTC Calculation: 417 R Axis:   39 Text Interpretation: Sinus rhythm Artifact Confirmed by [id number] (Geoffery Lyons) on 11/06/2020 7:09:06 PM    I have reviewed the labs performed to date as well as medications administered while in observation.  Recent changes in the last 24 hours include has remained cooperative.  Plan  Current plan is for transfer to psychiatric facility at 10:00 this morning. Patient is under full IVC at this time.   14/06/2020, MD 11/08/20 0730

## 2020-11-08 NOTE — Progress Notes (Signed)
.  D: Patient denies SI/HI/AVH. Patient presents with paranoia, anxiety and depression during assessment. Patient has no physical complaints at this time. Patient is frequently at nurses station with complaints of worrying and fearful about being "hurt" while on the unit. Patient frequently demands to leave, and states "I am not supposed to be here."   A: Patient was assessed by this nurse. Denies SI / HI / AVH. Patient received scheduled medications. Q x 15 minute observation checks were completed for safety. Patient was provided with verbal education on provided medications. Patient care plan was reviewed. Patient was offered support and encouragement. Patient was encourage to attend groups, participate in unit activities and continue with plan of care.    R: Patient adheres with scheduled medication.  Patient has no complaints of pain at this time. Patient is receptive to treatment and safety maintained on unit.

## 2020-11-08 NOTE — ED Notes (Signed)
[facility] called for transport 

## 2020-11-08 NOTE — Progress Notes (Signed)
Pt is a 29 yo female who presents through ED via [facility]. Pt is IVC by her husband. Husband reports pt is non med compliant, responding to internal stimuli, speaking to others that were not there, and fears someone is attempting to rape her. Pt has increasing paranoia of medication side effects. Pt reports that her psych meds are making her feel funny and black out. Pt has hx of Bipolar Affective Disorder w/Depression, OCD, GAD, and hx of suicide attempt with plan to OD. Pt shares stressor as being an anniversary of an important loss of an unidentified person. During assessment pt was flat and guarded. During assessment pt denies SI, HI, AVH. Pt shares non med compliance.    Skin assessment performed upon admission. Pt has a bruise from an IV on her left AC.

## 2020-11-08 NOTE — Tx Team (Signed)
Initial Treatment Plan 11/08/2020 3:58 PM Natalie [name] EAV:[id number]    PATIENT STRESSORS: Loss of no details given Marital or family conflict Medication change or noncompliance   PATIENT STRENGTHS: Communication skills Motivation for treatment/growth   PATIENT IDENTIFIED PROBLEMS: Ineffective Coping Skills  Non-med compliant                   DISCHARGE CRITERIA:  Improved stabilization in mood, thinking, and/or behavior Motivation to continue treatment in a less acute level of care  PRELIMINARY DISCHARGE PLAN: Outpatient therapy Return to previous living arrangement  PATIENT/FAMILY INVOLVEMENT: This treatment plan has been presented to and reviewed with the patient, Natalie [name], and/or family member.  The patient and family have been given the opportunity to ask questions and make suggestions.  Elpidio Anis, RN 11/08/2020, 3:58 PM

## 2020-11-08 NOTE — ED Notes (Signed)
Tried to call report and secretary said that they will call back in about 10 minutes because they are rounding with the psychiatrist right now.

## 2020-11-08 NOTE — ED Notes (Signed)
Transported to [facility] by [facility]. Belongings given to the [place]. Pt was cooperative and appropriate at the time of discharge.

## 2020-11-08 NOTE — H&P (Signed)
Psychiatric Admission Assessment Adult  Patient Identification: Natalie [name] MRN:  [id number] Date of Evaluation:  11/08/2020 Chief Complaint:  Bipolar affective disorder, current episode manic with psychotic symptoms (HCC) [F31.2] Principal Diagnosis: Bipolar affective disorder, current episode manic with psychotic symptoms (HCC) Diagnosis:  Principal Problem:   Bipolar affective disorder, current episode manic with psychotic symptoms (HCC) Active Problems:   OCD (obsessive compulsive disorder)  History of Present Illness: Natalie [name] is a 29 year old female with history of bipolar affective disorder, OCD, and GAD who presented to outside hospital with IVC papers in place. Per IVC paperwork, patient off her medications, responding to internal stimuli, speaking to others that were not there, and fearing that someone is trying to rape her.   Upon approaching patient's room on the unit, she is seen standing in the dark staring at the wall and appeared to be mumbling to unknown person. Once I knocked on her door she quickly came out, and asked to speak in private. She remains guarded during exam with minimal eye contact, and exhibits thought blocking. She states she is very afraid right now because of what has happened to her. She mentions IVC papers have been taken out on her numerous times, and she does not like to be in the hospital. She also fears that staff may try to harm her. She feels she was given too many medications and "blacked out" at the other hospital. However, she does have some insight into her mental illness and medication regimen. She is able to say her correct medication list of Geodon 40 mg at bedtime, Lithium 900 mg at bedtime, and cogentin 1 mg at bedtime. She states she does not take trazodone, and feels this is the one that made her "black out" at the other hospital. We reviewed her medication list at this time. Patient remains guarded about her spouse "PJ" as well. She  mentions wanting to speak to him, but is unsure of his phone number. Upon offering to give her the number we have in the chart, or to help her call him today she becomes very guarded, and asks [place] to "leave it alone."    Associated Signs/Symptoms: Depression Symptoms:  depressed mood, insomnia, anxiety, disturbed sleep, Duration of Depression Symptoms: No data recorded (Hypo) Manic Symptoms:  Delusions, Hallucinations, Labiality of Mood, Anxiety Symptoms:  Excessive Worry, Panic Symptoms, Psychotic Symptoms:  Delusions, Paranoia, Duration of Psychotic Symptoms: No data recorded PTSD Symptoms: Negative Total Time spent with patient: 45 minutes  Past Psychiatric History: Multiple inpatient hospitilizations. Most recently in August 2021 at [facility] in [place] where she was hospitalized for 2 weeks. She sees Dr. Gilmore Laroche for outpatient management. She was most recently stabilized on Geodon 40 mg QHS, Lithium CR 900 mg QHS, and cogentin 1 mg QHS. Past medication trials including Seroquel, Abilify, Zyprexa, Saphris, Fanapt, Invega, Risperdal, Tegretol. Lamictal, and Trazodone. History of suicide attempt via overdose in 2014.   Is the patient at risk to self? Yes.    Has the patient been a risk to self in the past 6 months? No.  Has the patient been a risk to self within the distant past? No.  Is the patient a risk to others? No.  Has the patient been a risk to others in the past 6 months? No.  Has the patient been a risk to others within the distant past? No.   Prior Inpatient Therapy:   Prior Outpatient Therapy:    Alcohol Screening:   Substance Abuse History in the  last 12 months:  No. Consequences of Substance Abuse: Negative Previous Psychotropic Medications: Yes  Psychological Evaluations: Yes  Past Medical History:  Past Medical History:  Diagnosis Date  . Anxiety   . Chronic bipolar disorder (HCC)   . Depression   . H/O suicide attempt 05-2013   OD  . Obesity   .  OCD (obsessive compulsive disorder)   . Psychiatric diagnosis    No past surgical history on file. Family History:  Family History  Problem Relation Age of Onset  . Hypertension Mother   . Depression Father   . Hypertension Father   . Bipolar disorder Father   . Hypertension Maternal Grandmother   . Obesity Maternal Grandmother   . Hypertension Paternal Grandmother    Family Psychiatric  History: Father with bipolar disorder Tobacco Screening:   Social History:  Social History   Substance and Sexual Activity  Alcohol Use No     Social History   Substance and Sexual Activity  Drug Use No    Additional Social History:                           Allergies:  No Known Allergies Lab Results:  Results for orders placed or performed during the hospital encounter of 11/06/20 (from the past 48 hour(s))  Resp Panel by RT-PCR (Flu A&B, Covid) Nasopharyngeal Swab     Status: None   Collection Time: 11/06/20  5:41 PM   Specimen: Nasopharyngeal Swab; Nasopharyngeal(NP) swabs in vial transport medium  Result Value Ref Range   SARS Coronavirus 2 by RT PCR NEGATIVE NEGATIVE    Comment: (NOTE) SARS-CoV-2 target nucleic acids are NOT DETECTED.  The SARS-CoV-2 RNA is generally detectable in upper respiratory specimens during the acute phase of infection. The lowest concentration of SARS-CoV-2 viral copies this assay can detect is 138 copies/mL. A negative result does not preclude SARS-Cov-2 infection and should not be used as the sole basis for treatment or other patient management decisions. A negative result may occur with  improper specimen collection/handling, submission of specimen other than nasopharyngeal swab, presence of viral mutation(s) within the areas targeted by this assay, and inadequate number of viral copies(<138 copies/mL). A negative result must be combined with clinical observations, patient history, and epidemiological information. The expected result is  Negative.  Fact Sheet for Patients:  BloggerCourse.com  Fact Sheet for Healthcare Providers:  SeriousBroker.it  This test is no t yet approved or cleared by the [place] FDA and  has been authorized for detection and/or diagnosis of SARS-CoV-2 by FDA under an Emergency Use Authorization (EUA). This EUA will remain  in effect (meaning this test can be used) for the duration of the COVID-19 declaration under Section 564(b)(1) of the Act, 21 U.S.C.section 360bbb-3(b)(1), unless the authorization is terminated  or revoked sooner.       Influenza A by PCR NEGATIVE NEGATIVE   Influenza B by PCR NEGATIVE NEGATIVE    Comment: (NOTE) The Xpert Xpress SARS-CoV-2/FLU/RSV plus assay is intended as an aid in the diagnosis of influenza from Nasopharyngeal swab specimens and should not be used as a sole basis for treatment. Nasal washings and aspirates are unacceptable for Xpert Xpress SARS-CoV-2/FLU/RSV testing.  Fact Sheet for Patients: BloggerCourse.com  Fact Sheet for Healthcare Providers: SeriousBroker.it  This test is not yet approved or cleared by the [place] FDA and has been authorized for detection and/or diagnosis of SARS-CoV-2 by FDA under an Emergency Use Authorization (  EUA). This EUA will remain in effect (meaning this test can be used) for the duration of the COVID-19 declaration under Section 564(b)(1) of the Act, 21 U.S.C. section 360bbb-3(b)(1), unless the authorization is terminated or revoked.  Performed at [facility], [address] [place], [place] [address]     Blood Alcohol level:  Lab Results  Component Value Date   ETH <10 11/06/2020   ETH <10 06/30/2020    Metabolic Disorder Labs:  Lab Results  Component Value Date   HGBA1C 5.5 03/11/2020   MPG 100 09/03/2014   Lab Results  Component Value Date   PROLACTIN 34.1 (H)  03/11/2020   Lab Results  Component Value Date   CHOL 141 04/21/2020   TRIG 54 04/21/2020   HDL 42 04/21/2020   CHOLHDL 3.4 04/21/2020   VLDL 11 04/21/2020   LDLCALC 88 04/21/2020   LDLCALC 149 (H) 10/23/2019    Current Medications: Current Facility-Administered Medications  Medication Dose Route Frequency Provider Last Rate Last Admin  . acetaminophen (TYLENOL) tablet 650 mg  650 mg Oral Q6H PRN Clapacs, John T, MD      . alum & mag hydroxide-simeth (MAALOX/MYLANTA) 200-200-20 MG/5ML suspension 30 mL  30 mL Oral Q4H PRN Clapacs, John T, MD      . benztropine (COGENTIN) tablet 1 mg  1 mg Oral QHS Jesse Sans, MD      . docusate sodium (COLACE) capsule 100 mg  100 mg Oral BID Clapacs, John T, MD      . lithium carbonate (ESKALITH) CR tablet 900 mg  900 mg Oral QHS Clapacs, John T, MD      . magnesium hydroxide (MILK OF MAGNESIA) suspension 30 mL  30 mL Oral Daily PRN Clapacs, John T, MD      . OLANZapine zydis (ZYPREXA) disintegrating tablet 10 mg  10 mg Oral BID PRN Clapacs, John T, MD      . senna-docusate (Senokot-S) tablet 1 tablet  1 tablet Oral QHS Clapacs, John T, MD      . ziprasidone (GEODON) capsule 40 mg  40 mg Oral QHS Clapacs, John T, MD      . ziprasidone (GEODON) injection 20 mg  20 mg Intramuscular Daily PRN Clapacs, Jackquline Denmark, MD       PTA Medications: Medications Prior to Admission  Medication Sig Dispense Refill Last Dose  . benztropine (COGENTIN) 0.5 MG tablet Take 1 tablet (0.5 mg total) by mouth 2 (two) times daily as needed for tremors. (Patient taking differently: Take 1 mg by mouth at bedtime. ) 60 tablet 0   . docusate sodium (COLACE) 100 MG capsule Take 1 capsule (100 mg total) by mouth 2 (two) times daily. For constipation (Patient not taking: Reported on 11/06/2020) 60 capsule 0   . lithium carbonate (ESKALITH) 450 MG CR tablet Take 2 tablets (900 mg total) by mouth at bedtime. For mood stabilization (Patient taking differently: Take 900 mg by mouth at  bedtime. ) 60 tablet 0   . senna (SENOKOT) 8.6 MG TABS tablet Take every other day after iron (Patient taking differently: Take 1-2 tablets by mouth daily as needed for mild constipation. ) 120 tablet 0   . senna-docusate (SENOKOT-S) 8.6-50 MG tablet Take 1 tablet by mouth at bedtime. (May buy from over the counter): For constipation (Patient not taking: Reported on 11/06/2020) 1 tablet 0   . ziprasidone (GEODON) 40 MG capsule Take 40 mg by mouth at bedtime.  Musculoskeletal: Strength & Muscle Tone: within normal limits Gait & Station: normal Patient leans: N/A  Psychiatric Specialty Exam: Physical Exam Vitals and nursing note reviewed.  Constitutional:      Appearance: Normal appearance.  HENT:     Head: Normocephalic and atraumatic.     Right Ear: External ear normal.     Left Ear: External ear normal.     Nose: Nose normal.     Mouth/Throat:     Mouth: Mucous membranes are moist.     Pharynx: Oropharynx is clear.  Eyes:     Extraocular Movements: Extraocular movements intact.     Conjunctiva/sclera: Conjunctivae normal.     Pupils: Pupils are equal, round, and reactive to light.  Cardiovascular:     Rate and Rhythm: Normal rate.     Pulses: Normal pulses.  Pulmonary:     Effort: Pulmonary effort is normal.     Breath sounds: Normal breath sounds.  Abdominal:     General: Abdomen is flat.     Palpations: Abdomen is soft.  Musculoskeletal:        General: No swelling. Normal range of motion.     Cervical back: Normal range of motion and neck supple.  Skin:    General: Skin is warm and dry.  Neurological:     General: No focal deficit present.     Mental Status: She is alert and oriented to person, place, and time.  Psychiatric:        Attention and Perception: She is inattentive.        Mood and Affect: Mood is depressed. Affect is blunt.        Speech: Speech is delayed.        Behavior: Behavior is withdrawn.        Thought Content: Thought content is paranoid  and delusional.        Cognition and Memory: Cognition is impaired. Memory is impaired.        Judgment: Judgment is inappropriate.     Review of Systems  Constitutional: Negative for appetite change and fatigue.  HENT: Negative for rhinorrhea and sore throat.   Eyes: Negative for photophobia and visual disturbance.  Respiratory: Negative for cough and shortness of breath.   Cardiovascular: Negative for chest pain and palpitations.  Gastrointestinal: Negative for constipation, diarrhea, nausea and vomiting.  Endocrine: Negative for cold intolerance and heat intolerance.  Genitourinary: Negative for difficulty urinating and dysuria.  Musculoskeletal: Negative for arthralgias and myalgias.  Skin: Negative for rash and wound.  Allergic/Immunologic: Negative for environmental allergies and food allergies.  Neurological: Negative for dizziness and headaches.  Hematological: Negative for adenopathy. Does not bruise/bleed easily.  Psychiatric/Behavioral: Positive for behavioral problems, confusion, decreased concentration, dysphoric mood and sleep disturbance. Negative for suicidal ideas.    There were no vitals taken for this visit.There is no height or weight on file to calculate BMI.  General Appearance: Guarded  Eye Contact:  Minimal  Speech:  Blocked and Slow  Volume:  Decreased  Mood:  Depressed  Affect:  Constricted and Inappropriate  Thought Process:  Coherent  Orientation:  Full (Time, Place, and Person)  Thought Content:  Delusions, Paranoid Ideation and Rumination  Suicidal Thoughts:  No  Homicidal Thoughts:  No  Memory:  Immediate;   Fair Recent;   Fair Remote;   Poor  Judgement:  Impaired  Insight:  Shallow  Psychomotor Activity:  Normal  Concentration:  Concentration: Fair and Attention Span: Poor  Recall:  Poor  Fund of Knowledge:  Poor  Language:  Poor  Akathisia:  Negative  Handed:  Right  AIMS (if indicated):     Assets:  Desire for Improvement Financial  Resources/Insurance Housing Physical Health Resilience Social Support  ADL's:  Intact  Cognition:  Impaired,  Mild  Sleep:          Treatment Plan Summary: Daily contact with patient to assess and evaluate symptoms and progress in treatment and Medication management PLAN OF CARE: 29 year old female with bipolar affective disorder, current episode manic with psychotic symptoms. Resume Lithium 900 mg QHS, Geodon 40 mg QHS, and Cogentin 1 mg QHS that patient previously stabilized on.   Observation Level/Precautions:  15 minute checks  Laboratory:  lipid panel, hemoglobin A1c  Psychotherapy:    Medications:    Consultations:    Discharge Concerns:    Estimated LOS:  Other:     Physician Treatment Plan for Primary Diagnosis: Bipolar affective disorder, current episode manic with psychotic symptoms (HCC) Long Term Goal(s): Improvement in symptoms so as ready for discharge  Short Term Goals: Ability to identify changes in lifestyle to reduce recurrence of condition will improve, Ability to verbalize feelings will improve, Ability to disclose and discuss suicidal ideas, Ability to demonstrate self-control will improve, Ability to identify and develop effective coping behaviors will improve, Ability to maintain clinical measurements within normal limits will improve and Compliance with prescribed medications will improve  Physician Treatment Plan for Secondary Diagnosis: Principal Problem:   Bipolar affective disorder, current episode manic with psychotic symptoms (HCC) Active Problems:   OCD (obsessive compulsive disorder)  Long Term Goal(s): Improvement in symptoms so as ready for discharge  Short Term Goals: Ability to identify changes in lifestyle to reduce recurrence of condition will improve, Ability to verbalize feelings will improve, Ability to disclose and discuss suicidal ideas, Ability to demonstrate self-control will improve, Ability to identify and develop effective coping  behaviors will improve, Ability to maintain clinical measurements within normal limits will improve and Compliance with prescribed medications will improve  I certify that inpatient services furnished can reasonably be expected to improve the patient's condition.    Jesse Sans, MD 12/10/20212:11 PM

## 2020-11-08 NOTE — Progress Notes (Signed)
NUTRITION ASSESSMENT  Pt identified as at risk on the Malnutrition Screen Tool  INTERVENTION: Ensure Enlive po BID, each supplement provides 350 kcal and 20 grams of protein  MVI with minerals daily  Recommend monitoring magnesium, potassium, and phosphorus daily for at least 3 days, MD to replete as needed, as pt is at risk for refeeding syndrome as noted below  NUTRITION DIAGNOSIS: Unintentional weight loss related to sub-optimal intake as evidenced by pt report.   Goal: Pt to meet >/= 90% of their estimated nutrition needs.  Monitor:  PO intake  Assessment:  RD working remotely.  29 y.o. female with history of bipolar affective disorder, prior suicide attempt in 2014 via overdose, OCD, and GAD who presented to outside hospital with IVC papers reporting pt off medications, responding to internal stimuli, speaking to others that were not there and fearing that someone is trying to rape her. Pt admitted to [facility] with bipolar affective disorder, current episode manic with psychotic symptoms.   Per notes, history obtained from husband reports pt has not been sleeping, pacing throughout the home at night. Husband endorsed weight loss, says pt tells him she is not eating because she is too upset. Per nutrition screening, pt reports 45-50 lb weight loss. Weight history reviewed, on 04/08/20 pt weighed 98.4 kg (216.48 lbs), on 06/21/20 she weighed 88.6 kg (194.92 lbs), on 07/22/20 pt weighed 86.5 kg (190.3 lbs), on 08/06/20 she weighed 86.2 kg (189.64 lbs), on 10/15/20 she weighed 81 kg (178.2 lbs) and currently she weighs 75.8 kg (166.76 lbs). This indicates ~50 lb (23%) wt loss in the last 7 months, which is significant for time frame. Given trends and reported poor po, highly suspect malnutrition, however unable to identify at this time. She is ordered a regular diet as well as offered choice of unit snacks mid-morning and mid-afternoon. There are no documented intakes for review.  RD will order  Ensure supplement BID as well as daily MVI to aid with meeting needs. Patient is at risk for refeeding given above, noted potassium 3.5 (WNL) on 12/8, recommend monitoring magnesium, potassium, and phosphorus daily for at least 3 days, MD to replete as needed,   Height: Ht Readings from Last 1 Encounters:  11/08/20 5\' 4"  (1.626 m)    Weight: Wt Readings from Last 1 Encounters:  11/08/20 75.8 kg    Weight Hx: Wt Readings from Last 10 Encounters:  11/08/20 75.8 kg  11/06/20 80.7 kg  10/15/20 81 kg  10/10/20 86.2 kg  08/06/20 86.2 kg  07/22/20 86.5 kg  06/30/20 88.6 kg  06/21/20 88.6 kg  04/17/20 93 kg  04/08/20 98.4 kg    BMI:  Body mass index is 28.67 kg/m. Pt meets criteria for overweight based on current BMI.  Estimated Nutritional Needs: Kcal: 25-30 kcal/kg Protein: > 1 gram protein/kg Fluid: 1 ml/kcal  Diet Order:  Diet Order            Diet regular Room service appropriate? Yes; Fluid consistency: Thin  Diet effective now                Lab results and medications reviewed.   06/08/20, RD, LDN Clinical Nutrition After Hours/Weekend Pager # in Amion

## 2020-11-08 NOTE — Progress Notes (Signed)
Pt observed pacing around the nurse's station obviously anxious. Pt continues to knock on nurse's station doors with questions. Pt stated that when she goes into her room there is an alarm going off and that she is not manic. Pt provided with reassurance that while the sound maybe very real to you this writer is not hearing any alarms going off. Pt then stated that she is not like everyone else here and that she did not belong here and what should she do. This Clinical research associate asked what did the pt mean by that statement. This Clinical research associate assured the pt that we do not admit people that do not need to be here and that this is short stay facility. Pt stated she was nervous and anxious and didn't know what to do. Pt did not want any medication for anxiety. This Clinical research associate suggested the pt watch tv, put together a puzzle, read a book or to ly down in bed and get some rest to help take her mind off of the situation. Hours later this pt is pacing around the nurse's station again. Pt states that she's not use to being in a place like and why did they admit her here. This Clinical research associate asked the pt what did she mean by that, where did she want to be admitted to. The pt stated she doesn't know why they didn't admit her to the regular hospital. This writer explained this part of the hospital. Pt continues to endorses anxiety and refuse medications for it and goes back to pacing around the nurse's station.

## 2020-11-08 NOTE — ED Notes (Signed)
Pt encouraged to use the bathroom prior to transport. Sheriff called and said that they will be here in about 20 minutes.

## 2020-11-09 LAB — LIPID PANEL
Cholesterol: 141 mg/dL (ref 0–200)
HDL: 45 mg/dL (ref >40)
LDL Cholesterol: 84 mg/dL (ref 0–99)
Total CHOL/HDL Ratio: 3.1 RATIO
Triglycerides: 59 mg/dL (ref <150)
VLDL: 12 mg/dL (ref 0–40)

## 2020-11-09 LAB — HEMOGLOBIN A1C
Hgb A1c MFr Bld: 4.9 % (ref 4.8–5.6)
Mean Plasma Glucose: 93.93 mg/dL

## 2020-11-09 NOTE — [facility] Group Notes (Signed)
Patient attended the NA/AA Group  

## 2020-11-09 NOTE — Progress Notes (Signed)
Pt is calm, cooperative, denies suicidal and homicidal ideation, denies hallucinations, denies feelings of depression and anxiety. Pt is very paranoid, thinks people are out to get her, pt is confused at times, poor insight, states, "I don't know why I'm here." Pt is informed of the reason for her admission and education about her mental health was provided. Pt is focused on discharge. Pt wanders around the unit slowly, pt was found hiding in the corner of the room, was provided with emotional support and reassurance. Pt could not verbalized reason for hiding in corners in her room. Will continue to monitor pt per 15 minute face checks and monitor for safety and progress.

## 2020-11-09 NOTE — Progress Notes (Signed)
[facility] MD Progress Note  11/09/2020 8:35 AM Natalie [name]  MRN:  [id number]   Principal Problem: Bipolar affective disorder, current episode manic with psychotic symptoms (HCC) Diagnosis: Principal Problem:   Bipolar affective disorder, current episode manic with psychotic symptoms (HCC) Active Problems:   OCD (obsessive compulsive disorder)  Natalie [name] is a 29 y.o. female patient who presents to the [facility] unit for treatment of worsened paranoia and depression in the context of psychotropic medication non-adherence.   Interval History Patient was seen today for re-evaluation.  Nursing reports no events overnight. The patient has no issues with performing ADLs.  Patient has been medication partially compliant - she refused Geodon, but took Lithium last night.    Subjective:  On assessment patient reports "I cannot be here" and asking for discharge. She discloses paranoid delusions of feeling not safe in the and fear of being hurt here. We discussed unit security system and that no one can come here from outside to hurt her; she reports fear of being hurt by people insight the unit without mentioning any particular people. She denies thiughts of harming self or others. Denies hallucinations. She refused offered prn medication. She cannot explain why she refused to take Geodon last night, encouraged to take all medicines. She denies any side effects from medications; she reports she was on medications in the past and they were working well. She denies any physical complaints.     Labs: lipid profile reviewed 11/09/20 - WNL.     Total Time spent with patient: 20 minutes  Past Psychiatric History: see H&P  Past Medical History:  Past Medical History:  Diagnosis Date  . Anxiety   . Chronic bipolar disorder (HCC)   . Depression   . H/O suicide attempt 05-2013   OD  . Obesity   . OCD (obsessive compulsive disorder)   . Psychiatric diagnosis    History reviewed. No pertinent surgical  history. Family History:  Family History  Problem Relation Age of Onset  . Hypertension Mother   . Depression Father   . Hypertension Father   . Bipolar disorder Father   . Hypertension Maternal Grandmother   . Obesity Maternal Grandmother   . Hypertension Paternal Grandmother    Family Psychiatric  History: see H&P  Social History:  Social History   Substance and Sexual Activity  Alcohol Use No     Social History   Substance and Sexual Activity  Drug Use No    Social History   Socioeconomic History  . Marital status: Married    Spouse name: [name]  . Number of children: 0  . Years of education: Not on file  . Highest education level: Not on file  Occupational History  . Occupation: unemployed  Tobacco Use  . Smoking status: Never Smoker  . Smokeless tobacco: Never Used  Vaping Use  . Vaping Use: Never used  Substance and Sexual Activity  . Alcohol use: No  . Drug use: No  . Sexual activity: Yes    Birth control/protection: I.U.D.  Other Topics Concern  . Not on file  Social History Narrative   Lives with husband   Right handed   Drinks caffeine seldomly   Social Determinants of Health   Financial Resource Strain: Not on file  Food Insecurity: Not on file  Transportation Needs: Not on file  Physical Activity: Not on file  Stress: Not on file  Social Connections: Not on file   Additional Social History:  Sleep: Poor  Appetite:  Fair  Current Medications: Current Facility-Administered Medications  Medication Dose Route Frequency Provider Last Rate Last Admin  . acetaminophen (TYLENOL) tablet 650 mg  650 mg Oral Q6H PRN Clapacs, John T, MD      . alum & mag hydroxide-simeth (MAALOX/MYLANTA) 200-200-20 MG/5ML suspension 30 mL  30 mL Oral Q4H PRN Clapacs, John T, MD      . benztropine (COGENTIN) tablet 1 mg  1 mg Oral QHS Jesse Sans, MD   1 mg at 11/08/20 2107  . docusate sodium (COLACE) capsule 100 mg  100  mg Oral BID Clapacs, Jackquline Denmark, MD   100 mg at 11/08/20 1643  . feeding supplement (ENSURE ENLIVE / ENSURE PLUS) liquid 237 mL  237 mL Oral BID BM Jesse Sans, MD      . lithium carbonate (ESKALITH) CR tablet 900 mg  900 mg Oral QHS Clapacs, Jackquline Denmark, MD   900 mg at 11/08/20 2107  . magnesium hydroxide (MILK OF MAGNESIA) suspension 30 mL  30 mL Oral Daily PRN Clapacs, John T, MD      . multivitamin with minerals tablet 1 tablet  1 tablet Oral Daily Jesse Sans, MD      . OLANZapine zydis (ZYPREXA) disintegrating tablet 10 mg  10 mg Oral BID PRN Clapacs, Jackquline Denmark, MD      . senna-docusate (Senokot-S) tablet 1 tablet  1 tablet Oral QHS Clapacs, Jackquline Denmark, MD   1 tablet at 11/08/20 2107  . ziprasidone (GEODON) capsule 40 mg  40 mg Oral QHS Clapacs, John T, MD      . ziprasidone (GEODON) injection 20 mg  20 mg Intramuscular Daily PRN Clapacs, Jackquline Denmark, MD        Lab Results:  Results for orders placed or performed during the hospital encounter of 11/08/20 (from the past 48 hour(s))  Lipid panel     Status: None   Collection Time: 11/09/20  7:02 AM  Result Value Ref Range   Cholesterol 141 0 - 200 mg/dL   Triglycerides 59 <161 mg/dL   HDL 45 >09 mg/dL   Total CHOL/HDL Ratio 3.1 RATIO   VLDL 12 0 - 40 mg/dL   LDL Cholesterol 84 0 - 99 mg/dL    Comment:        Total Cholesterol/HDL:CHD Risk Coronary Heart Disease Risk Table                     Men   Women  1/2 Average Risk   3.4   3.3  Average Risk       5.0   4.4  2 X Average Risk   9.6   7.1  3 X Average Risk  23.4   11.0        Use the calculated Patient Ratio above and the CHD Risk Table to determine the patient's CHD Risk.        ATP III CLASSIFICATION (LDL):  <100     mg/dL   Optimal  604-540  mg/dL   Near or Above                    Optimal  130-159  mg/dL   Borderline  981-191  mg/dL   High  >478     mg/dL   Very High Performed at [facility], [address] [place], [place] [address]     Blood Alcohol level:   Lab Results  Component  Value Date   ETH <10 11/06/2020   ETH <10 06/30/2020    Metabolic Disorder Labs: Lab Results  Component Value Date   HGBA1C 5.5 03/11/2020   MPG 100 09/03/2014   Lab Results  Component Value Date   PROLACTIN 34.1 (H) 03/11/2020   Lab Results  Component Value Date   CHOL 141 11/09/2020   TRIG 59 11/09/2020   HDL 45 11/09/2020   CHOLHDL 3.1 11/09/2020   VLDL 12 11/09/2020   LDLCALC 84 11/09/2020   LDLCALC 88 04/21/2020    Physical Findings: AIMS: Facial and Oral Movements Muscles of Facial Expression: None, normal Lips and Perioral Area: None, normal Jaw: None, normal Tongue: None, normal,Extremity Movements Upper (arms, wrists, hands, fingers): None, normal Lower (legs, knees, ankles, toes): None, normal, Trunk Movements Neck, shoulders, hips: None, normal, Overall Severity Severity of abnormal movements (highest score from questions above): None, normal Incapacitation due to abnormal movements: None, normal Patient's awareness of abnormal movements (rate only patient's report): No Awareness, Dental Status Current problems with teeth and/or dentures?: No Does patient usually wear dentures?: No  CIWA:    COWS:     Musculoskeletal: Strength & Muscle Tone: within normal limits Gait & Station: normal Patient leans: N/A  Psychiatric Specialty Exam: Physical Exam  Review of Systems  Blood pressure 124/88, pulse 93, temperature 98.3 F (36.8 C), temperature source Oral, resp. rate (!) 22, height 5\' 4"  (1.626 m), weight 75.8 kg, SpO2 100 %.Body mass index is 28.67 kg/m.  General Appearance: Guarded  Eye Contact:  Fair  Speech:  Pressured  Volume:  Decreased  Mood:  Anxious and Dysphoric  Affect:  Constricted  Thought Process:  Disorganized  Orientation:  Full (Time, Place, and Person)  Thought Content:  Illogical, Delusions and Paranoid Ideation  Suicidal Thoughts:  No  Homicidal Thoughts:  No  Memory:  Immediate;   Fair Recent;    Fair Remote;   NA  Judgement:  Impaired  Insight:  Lacking  Psychomotor Activity:  Restlessness  Concentration:  Concentration: Poor and Attention Span: Poor  Recall:  NA  Fund of Knowledge:  Fair  Language:  Fair  Akathisia:  No  Handed:  Right  AIMS (if indicated):     Assets:  Desire for Improvement Physical Health  ADL's:  Intact  Cognition:  Impaired,  Mild  Sleep:        Treatment Plan Summary: Daily contact with patient to assess and evaluate symptoms and progress in treatment and Medication management   Patient is a 29 year old female with the above-stated past psychiatric history who is seen in follow-up.  Chart reviewed. Patient discussed with nursing. Patient is partially-compliant with medications; continues to express paranoid persecutory delusions; no aggressive or agitated behavior in the unit. Compliance with medications encouraged. All medications resumed lest night; no changes today.     Plan:  -continue inpatient psych admission; 15-minute checks; daily contact with patient to assess and evaluate symptoms and progress in treatment; psychoeducation.  -continue scheduled medications: . benztropine  1 mg Oral QHS  . docusate sodium  100 mg Oral BID  . feeding supplement  237 mL Oral BID BM  . lithium carbonate  900 mg Oral QHS  . multivitamin with minerals  1 tablet Oral Daily  . senna-docusate  1 tablet Oral QHS  . ziprasidone  40 mg Oral QHS   -continue PRN medications.  acetaminophen, alum & mag hydroxide-simeth, magnesium hydroxide, OLANZapine zydis, ziprasidone  -Pertinent Labs: lipid panel reviewed - WNL.  -  EKG: on 12/9 showed Qtc 417.    -Consults: No new consults placed since yesterday    -Disposition: patient is not ready for d/c yet. All necessary aftercare will be arranged prior to discharge Likely d/c home with outpatient psych follow-up.  -  I certify that the patient does need, on a daily basis, active treatment furnished directly by or  requiring the supervision of inpatient psychiatric facility personnel.   Thalia Party, MD 11/09/2020, 8:35 AM

## 2020-11-09 NOTE — [facility] Group Notes (Signed)
  [facility] LCSW Group Therapy Note  Date/Time:  11/09/2020 1:21 PM- 2:10 PM   Type of Therapy and Topic:  Group Therapy:  Feelings About Hospitalization  Participation Level:  Active   Description of Group This process group involved patients discussing their feelings related to being hospitalized, as well as the benefits they see to being in the hospital.  These feelings and benefits were itemized.  The group then brainstormed specific ways in which they could seek those same benefits when they discharge and return home.  Therapeutic Goals 1. Patient will identify and describe positive and negative feelings related to hospitalization 2. Patient will verbalize benefits of hospitalization to themselves personally 3. Patients will brainstorm together ways they can obtain similar benefits in the outpatient setting, identify barriers to wellness and possible solutions  Summary of Patient Progress:  Patient checked into group feeling not so good. Patient feels overwhelmed with being in a new environment. Patient stated that a positive for her is having people that will help her. Patient stated a negative is being in a new place. Patient was quiet during group and left to go to the bathroom and did not return.   Therapeutic Modalities Cognitive Behavioral Therapy Motivational Interviewing    Susa Simmonds, [place] 11/09/2020  2:47 PM
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
## 2020-11-09 NOTE — B[facility] Suicide Risk Assessment (Signed)
[facility] INPATIENT:  Family/Significant Other Suicide Prevention Education  Suicide Prevention Education:  Education Completed; Natalie [name] (Husband) has been identified by the patient as the family member/significant other with whom the patient will be residing, and identified as the person(s) who will aid the patient in the event of a mental health crisis (suicidal ideations/suicide attempt).  With written consent from the patient, the family member/significant other has been provided the following suicide prevention education, prior to the and/or following the discharge of the patient.  The suicide prevention education provided includes the following:  Suicide risk factors  Suicide prevention and interventions  National Suicide Hotline telephone number  [facility] assessment telephone number  [facility] Emergency Assistance 911  [facility] and/or Residential Mobile Crisis Unit telephone number  Request made of family/significant other to:  Remove weapons (e.g., guns, rifles, knives), all items previously/currently identified as safety concern.    Remove drugs/medications (over-the-counter, prescriptions, illicit drugs), all items previously/currently identified as a safety concern.  The family member/significant other verbalizes understanding of the suicide prevention education information provided.  The family member/significant other agrees to remove the items of safety concern listed above.  Natalie [name]  Natalie [name] 11/09/2020, 3:02 PM

## 2020-11-10 MED ORDER — ZIPRASIDONE HCL 40 MG PO CAPS: 40.0000 mg | ORAL_CAPSULE | Freq: Two times a day (BID) | ORAL | Status: DC

## 2020-11-10 MED ORDER — ZIPRASIDONE HCL 40 MG PO CAPS
40.0000 mg | ORAL_CAPSULE | Freq: Every day | ORAL | Status: DC
  Administered 2020-11-10 – 2020-11-11 (×2): 40 mg via ORAL
  Filled 2020-11-10 (×2): qty 1

## 2020-11-10 MED ORDER — ZIPRASIDONE HCL 20 MG PO CAPS
20.0000 mg | ORAL_CAPSULE | Freq: Every day | ORAL | Status: DC
  Administered 2020-11-10 – 2020-11-17 (×8): 20 mg via ORAL
  Filled 2020-11-10 (×9): qty 1

## 2020-11-10 NOTE — Progress Notes (Signed)
Patient has been continuing to be paranoid and afraid to stay in her room. Took her hs medications. Denies SI and HI.

## 2020-11-10 NOTE — Progress Notes (Signed)
Patient very paranoid and reluctant to take her medication. After medication teaching was done, she stood for quite some time at the med window, just staring at her medication, saying "this is not right". Denies SI, HI and AVH but admits to being fearful and thinks that every time she sees someone walking up the hallway they are "reporting her" for stealing something

## 2020-11-10 NOTE — Progress Notes (Signed)
[facility] MD Progress Note  11/10/2020 9:36 AM Natalie [name]  MRN:  [id number]   Principal Problem: Bipolar affective disorder, current episode manic with psychotic symptoms (HCC) Diagnosis: Principal Problem:   Bipolar affective disorder, current episode manic with psychotic symptoms (HCC) Active Problems:   OCD (obsessive compulsive disorder)  Natalie [name] is a 29 y.o. female patient who presents to the [facility] unit for treatment of worsened paranoia and depression in the context of psychotropic medication non-adherence.   Interval History Patient was seen today for re-evaluation.  Nursing reports no events overnight. The patient continues to express paranoid behavior regarding food and medications; reluctant to take her medication and requires a lot of teaching and encouragement from nurses.   Subjective:  On assessment patient reports "This is not right place for me to be. I cannot be here. It make me feel worse". Keeps asking for discharge. She continues to express paranoid delusions of feeling not safe in the hospital, fear of being hurt here, she is concerned about food quality and  Is afraid to sleep due to fear that something bad happened while she is asleep. She denies thoughts of harming self or others. Denies hallucinations. She refused offered prn medication. We discussed that she does not expressing any progress and that this is too early to talk about discharge. She agreed to add morning dose of Geodon. She denies any side effects from medications, except of sedation, although I think that lack of night sleep contributes to her daytime sleepiness. She denies any physical complaints.     Labs: lipid profile reviewed 11/09/20 - WNL.        11/06/2020 13:14  Lithium      0.80    Total Time spent with patient: 25 min  Past Psychiatric History: see H&P  Past Medical History:  Past Medical History:  Diagnosis Date  . Anxiety   . Chronic bipolar disorder (HCC)   . Depression   . H/O  suicide attempt 05-2013   OD  . Obesity   . OCD (obsessive compulsive disorder)   . Psychiatric diagnosis    History reviewed. No pertinent surgical history. Family History:  Family History  Problem Relation Age of Onset  . Hypertension Mother   . Depression Father   . Hypertension Father   . Bipolar disorder Father   . Hypertension Maternal Grandmother   . Obesity Maternal Grandmother   . Hypertension Paternal Grandmother    Family Psychiatric  History: see H&P  Social History:  Social History   Substance and Sexual Activity  Alcohol Use No     Social History   Substance and Sexual Activity  Drug Use No    Social History   Socioeconomic History  . Marital status: Married    Spouse name: [name]  . Number of children: 0  . Years of education: Not on file  . Highest education level: Not on file  Occupational History  . Occupation: unemployed  Tobacco Use  . Smoking status: Never Smoker  . Smokeless tobacco: Never Used  Vaping Use  . Vaping Use: Never used  Substance and Sexual Activity  . Alcohol use: No  . Drug use: No  . Sexual activity: Yes    Birth control/protection: I.U.D.  Other Topics Concern  . Not on file  Social History Narrative   Lives with husband   Right handed   Drinks caffeine seldomly   Social Determinants of Health   Financial Resource Strain: Not on file  Food Insecurity: Not  on file  Transportation Needs: Not on file  Physical Activity: Not on file  Stress: Not on file  Social Connections: Not on file   Additional Social History:                         Sleep: Poor  Appetite:  Fair  Current Medications: Current Facility-Administered Medications  Medication Dose Route Frequency Provider Last Rate Last Admin  . acetaminophen (TYLENOL) tablet 650 mg  650 mg Oral Q6H PRN Clapacs, John T, MD      . alum & mag hydroxide-simeth (MAALOX/MYLANTA) 200-200-20 MG/5ML suspension 30 mL  30 mL Oral Q4H PRN Clapacs, John T,  MD      . benztropine (COGENTIN) tablet 1 mg  1 mg Oral QHS Jesse Sans, MD   1 mg at 11/09/20 2054  . docusate sodium (COLACE) capsule 100 mg  100 mg Oral BID Clapacs, Jackquline Denmark, MD   100 mg at 11/08/20 1643  . feeding supplement (ENSURE ENLIVE / ENSURE PLUS) liquid 237 mL  237 mL Oral BID BM Jesse Sans, MD      . lithium carbonate (ESKALITH) CR tablet 900 mg  900 mg Oral QHS Clapacs, Jackquline Denmark, MD   900 mg at 11/09/20 2055  . magnesium hydroxide (MILK OF MAGNESIA) suspension 30 mL  30 mL Oral Daily PRN Clapacs, Jackquline Denmark, MD      . multivitamin with minerals tablet 1 tablet  1 tablet Oral Daily Jesse Sans, MD      . OLANZapine zydis (ZYPREXA) disintegrating tablet 10 mg  10 mg Oral BID PRN Clapacs, Jackquline Denmark, MD      . senna-docusate (Senokot-S) tablet 1 tablet  1 tablet Oral QHS Clapacs, Jackquline Denmark, MD   1 tablet at 11/09/20 2056  . ziprasidone (GEODON) capsule 20 mg  20 mg Oral QPC breakfast Jamair Cato, Serina Cowper, MD      . ziprasidone (GEODON) capsule 40 mg  40 mg Oral QHS Claudy Abdallah, Serina Cowper, MD      . ziprasidone (GEODON) injection 20 mg  20 mg Intramuscular Daily PRN Clapacs, Jackquline Denmark, MD        Lab Results:  Results for orders placed or performed during the hospital encounter of 11/08/20 (from the past 48 hour(s))  Lipid panel     Status: None   Collection Time: 11/09/20  7:02 AM  Result Value Ref Range   Cholesterol 141 0 - 200 mg/dL   Triglycerides 59 <811 mg/dL   HDL 45 >91 mg/dL   Total CHOL/HDL Ratio 3.1 RATIO   VLDL 12 0 - 40 mg/dL   LDL Cholesterol 84 0 - 99 mg/dL    Comment:        Total Cholesterol/HDL:CHD Risk Coronary Heart Disease Risk Table                     Men   Women  1/2 Average Risk   3.4   3.3  Average Risk       5.0   4.4  2 X Average Risk   9.6   7.1  3 X Average Risk  23.4   11.0        Use the calculated Patient Ratio above and the CHD Risk Table to determine the patient's CHD Risk.        ATP III CLASSIFICATION (LDL):  <100     mg/dL   Optimal  478-295  mg/dL  Near or Above                    Optimal  130-159  mg/dL   Borderline  431-540  mg/dL   High  >086     mg/dL   Very High Performed at [facility], [address] [place], [place] [address]   Hemoglobin A1c     Status: None   Collection Time: 11/09/20  7:02 AM  Result Value Ref Range   Hgb A1c MFr Bld 4.9 4.8 - 5.6 %    Comment: (NOTE) Pre diabetes:          5.7%-6.4%  Diabetes:              >6.4%  Glycemic control for   <7.0% adults with diabetes    Mean Plasma Glucose 93.93 mg/dL    Comment: Performed at [facility], [address] [place], [place] [address]    Blood Alcohol level:  Lab Results  Component Value Date   [facility] <10 11/06/2020   ETH <10 06/30/2020    Metabolic Disorder Labs: Lab Results  Component Value Date   HGBA1C 4.9 11/09/2020   MPG 93.93 11/09/2020   MPG 100 09/03/2014   Lab Results  Component Value Date   PROLACTIN 34.1 (H) 03/11/2020   Lab Results  Component Value Date   CHOL 141 11/09/2020   TRIG 59 11/09/2020   HDL 45 11/09/2020   CHOLHDL 3.1 11/09/2020   VLDL 12 11/09/2020   LDLCALC 84 11/09/2020   LDLCALC 88 04/21/2020    Physical Findings: AIMS: Facial and Oral Movements Muscles of Facial Expression: None, normal Lips and Perioral Area: None, normal Jaw: None, normal Tongue: None, normal,Extremity Movements Upper (arms, wrists, hands, fingers): None, normal Lower (legs, knees, ankles, toes): None, normal, Trunk Movements Neck, shoulders, hips: None, normal, Overall Severity Severity of abnormal movements (highest score from questions above): None, normal Incapacitation due to abnormal movements: None, normal Patient's awareness of abnormal movements (rate only patient's report): No Awareness, Dental Status Current problems with teeth and/or dentures?: No Does patient usually wear dentures?: No  CIWA:    COWS:     Musculoskeletal: Strength & Muscle Tone: within normal limits Gait & Station:  normal Patient leans: N/A  Psychiatric Specialty Exam: Physical Exam   Review of Systems   Blood pressure 136/85, pulse 92, temperature 98.6 F (37 C), temperature source Oral, resp. rate 18, height 5\' 4"  (1.626 m), weight 75.8 kg, SpO2 100 %.Body mass index is 28.67 kg/m.  General Appearance: Guarded  Eye Contact:  Fair  Speech:  Pressured  Volume:  Decreased  Mood:  Anxious and Dysphoric  Affect:  Constricted  Thought Process:  Disorganized  Orientation:  Full (Time, Place, and Person)  Thought Content:  Illogical, Delusions and Paranoid Ideation  Suicidal Thoughts:  No  Homicidal Thoughts:  No  Memory:  Immediate;   Fair Recent;   Fair Remote;   NA  Judgement:  Impaired  Insight:  Lacking  Psychomotor Activity:  Restlessness  Concentration:  Concentration: Poor and Attention Span: Poor  Recall:  NA  Fund of Knowledge:  Fair  Language:  Fair  Akathisia:  No  Handed:  Right  AIMS (if indicated):     Assets:  Desire for Improvement Physical Health  ADL's:  Intact  Cognition:  Impaired,  Mild  Sleep:  Number of Hours: 2.5     Treatment Plan Summary: Daily contact with patient to assess and  evaluate symptoms and progress in treatment and Medication management   Patient is a 29 year old female with the above-stated past psychiatric history who is seen in follow-up.  Chart reviewed. Patient discussed with nursing. Patient continues to express paranoid persecutory delusions; no aggressive or agitated behavior in the unit. Compliance with medications encouraged. Will add morning dose of antipsychotic (Geodon  QAM) today, continue nighttime medications (Geodon  HS, Lithium  HS).  Plan: -continue inpatient psych admission; 15-minute checks; daily contact with patient to assess and evaluate symptoms and progress in treatment; psychoeducation.  -continue scheduled medications: add AM dose of Geodon. . benztropine  1 mg Oral QHS  . docusate sodium  100 mg Oral  BID  . feeding supplement  237 mL Oral BID BM  . lithium carbonate  900 mg Oral QHS  . multivitamin with minerals  1 tablet Oral Daily  . senna-docusate  1 tablet Oral QHS  . ziprasidone  20 mg Oral QPC breakfast  . ziprasidone  40 mg Oral QHS   -continue PRN medications.  acetaminophen, alum & mag hydroxide-simeth, magnesium hydroxide, OLANZapine zydis, ziprasidone  -Pertinent Labs: lipid panel reviewed - WNL. Li level 12/08 - 0.59mmol/L.  -EKG: on 12/9 showed Qtc 417.    -Consults: No new consults placed since yesterday    -Disposition: patient is not ready for d/c yet. All necessary aftercare will be arranged prior to discharge Likely d/c home with outpatient psych follow-up.  -  I certify that the patient does need, on a daily basis, active treatment furnished directly by or requiring the supervision of inpatient psychiatric facility personnel.   Thalia Party, MD 11/10/2020, 9:36 AM

## 2020-11-10 NOTE — Progress Notes (Signed)
°   11/10/20 1135  Clinical Encounter Type  Visited With Patient  Visit Type Initial;Spiritual support;Social support  Referral From Chaplain  Consult/Referral To Chaplain  Talk to Pt about her marriage. Pt was happy I came. I will follow up later.

## 2020-11-10 NOTE — Plan of Care (Signed)
  Problem: Education: Goal: Knowledge of [facility] Education information/materials will improve Outcome: Progressing Goal: Emotional status will improve Outcome: Progressing Goal: Mental status will improve Outcome: Progressing Goal: Verbalization of understanding the information provided will improve Outcome: Progressing   Problem: Activity: Goal: Interest or engagement in activities will improve Outcome: Progressing Goal: Sleeping patterns will improve Outcome: Progressing   Problem: Coping: Goal: Ability to verbalize frustrations and anger appropriately will improve Outcome: Progressing Goal: Ability to demonstrate self-control will improve Outcome: Progressing   Problem: Health Behavior/Discharge Planning: Goal: Identification of resources available to assist in meeting health care needs will improve Outcome: Progressing Goal: Compliance with treatment plan for underlying cause of condition will improve Outcome: Progressing   Problem: Physical Regulation: Goal: Ability to maintain clinical measurements within normal limits will improve Outcome: Progressing   Problem: Safety: Goal: Periods of time without injury will increase Outcome: Progressing   Problem: Activity: Goal: Will verbalize the importance of balancing activity with adequate rest periods Outcome: Progressing   Problem: Education: Goal: Will be free of psychotic symptoms Outcome: Progressing Goal: Knowledge of the prescribed therapeutic regimen will improve Outcome: Progressing   Problem: Coping: Goal: Coping ability will improve Outcome: Progressing Goal: Will verbalize feelings Outcome: Progressing   Problem: Health Behavior/Discharge Planning: Goal: Compliance with prescribed medication regimen will improve Outcome: Progressing   Problem: Nutritional: Goal: Ability to achieve adequate nutritional intake will improve Outcome: Progressing   Problem: Role Relationship: Goal:  Ability to communicate needs accurately will improve Outcome: Progressing Goal: Ability to interact with others will improve Outcome: Progressing   Problem: Safety: Goal: Ability to redirect hostility and anger into socially appropriate behaviors will improve Outcome: Progressing Goal: Ability to remain free from injury will improve Outcome: Progressing   Problem: Self-Care: Goal: Ability to participate in self-care as condition permits will improve Outcome: Progressing   Problem: Self-Concept: Goal: Will verbalize positive feelings about self Outcome: Progressing   

## 2020-11-10 NOTE — Plan of Care (Signed)
  Problem: Education: Goal: Knowledge of [facility] Education information/materials will improve Outcome: Not Progressing Goal: Emotional status will improve Outcome: Not Progressing Goal: Mental status will improve Outcome: Not Progressing Goal: Verbalization of understanding the information provided will improve Outcome: Not Progressing   Problem: Activity: Goal: Interest or engagement in activities will improve Outcome: Not Progressing Goal: Sleeping patterns will improve Outcome: Not Progressing   Problem: Coping: Goal: Ability to verbalize frustrations and anger appropriately will improve Outcome: Not Progressing Goal: Ability to demonstrate self-control will improve Outcome: Not Progressing   Problem: Health Behavior/Discharge Planning: Goal: Identification of resources available to assist in meeting health care needs will improve Outcome: Not Progressing Goal: Compliance with treatment plan for underlying cause of condition will improve Outcome: Not Progressing   Problem: Physical Regulation: Goal: Ability to maintain clinical measurements within normal limits will improve Outcome: Not Progressing   Problem: Safety: Goal: Periods of time without injury will increase Outcome: Not Progressing   Problem: Activity: Goal: Will verbalize the importance of balancing activity with adequate rest periods Outcome: Not Progressing   Problem: Education: Goal: Will be free of psychotic symptoms Outcome: Not Progressing Goal: Knowledge of the prescribed therapeutic regimen will improve Outcome: Not Progressing   Problem: Coping: Goal: Coping ability will improve Outcome: Not Progressing Goal: Will verbalize feelings Outcome: Not Progressing   Problem: Health Behavior/Discharge Planning: Goal: Compliance with prescribed medication regimen will improve Outcome: Not Progressing   Problem: Nutritional: Goal: Ability to achieve adequate nutritional intake will  improve Outcome: Not Progressing   Problem: Role Relationship: Goal: Ability to communicate needs accurately will improve Outcome: Not Progressing Goal: Ability to interact with others will improve Outcome: Not Progressing   Problem: Safety: Goal: Ability to redirect hostility and anger into socially appropriate behaviors will improve Outcome: Not Progressing Goal: Ability to remain free from injury will improve Outcome: Not Progressing   Problem: Self-Care: Goal: Ability to participate in self-care as condition permits will improve Outcome: Not Progressing   Problem: Self-Concept: Goal: Will verbalize positive feelings about self Outcome: Not Progressing   

## 2020-11-10 NOTE — Progress Notes (Signed)
Pt is alert and oriented to person, place, time but not to situation. Pt has been paranoid, reports she thinks people are following her and is trembling at times. Pt reports that she is trembling due to fear that something bad might happen to her. Pt was given PRN Zyprexa and reports "Im afraid of taking any of my psych meds because they might knock me out and then something bad will happen to me in my sleep." Pt ate about 20% of her dinner, ate one pop tart and a Gatorade for breakfast, refused lunch. Pt denies suicidal an homicidal ideation, denies hallucinations, denies feelings of depression, reports anxiety. Pt knocks, becomes easily angry and irritable shaking her head and repeating often that she wants to go home. Pt has poor insight, is not able to provide a reason for admission. Will continue to monitor pt per Q15 minute face checks and monitor for safety and progress.

## 2020-11-10 NOTE — [facility] Group Notes (Signed)
   LCSW Group Therapy Note    10/19/2020: 1:15 PM- 2:15 PM     Type of Therapy and Topic:  Group Therapy:  Overcoming Obstacles     Participation Level:  Active     Description of Group:     In this group patients will be encouraged to explore what they see as obstacles to their own wellness and recovery. They will be guided to discuss their thoughts, feelings, and behaviors related to these obstacles. The group will process together ways to cope with barriers, with attention given to specific choices patients can make. Each patient will be challenged to identify changes they are motivated to make in order to overcome their obstacles. This group will be process-oriented, with patients participating in exploration of their own experiences as well as giving and receiving support and challenge from other group members.     Therapeutic Goals:  1.    Patient will identify personal and current obstacles as they relate to admission.  2.    Patient will identify barriers that currently interfere with their wellness or overcoming obstacles.  3.    Patient will identify feelings, thought process and behaviors related to these barriers.  4.    Patient will identify two changes they are willing to make to overcome these obstacles:        Summary of Patient Progress: Patient checked into group feeling better. Patient stated that she is currently working on her attitude and being in big crowds. Patient was dozing off during group and did not want to share any fears or changes she will make upon discharge.          Therapeutic Modalities:    Cognitive Behavioral Therapy  Solution Focused Therapy  Motivational Interviewing  Relapse Prevention Therapy    Susa Simmonds, LCSWA   11/10/2020

## 2020-11-11 NOTE — Progress Notes (Signed)
[facility] MD Progress Note  11/11/2020 10:22 AM Gaylynn Seiple  MRN:  [id number]   Principal Problem: Bipolar affective disorder, current episode manic with psychotic symptoms (HCC) Diagnosis: Principal Problem:   Bipolar affective disorder, current episode manic with psychotic symptoms (HCC) Active Problems:   OCD (obsessive compulsive disorder)  Ms.Konicek is a 29 y.o. female patient who presents to the [facility] unit for treatment of worsened paranoia and depression in the context of psychotropic medication non-adherence.   Interval History Patient was seen today for re-evaluation.  Nursing reports no events overnight. The patient continues to express paranoid behavior regarding food and medications; reluctant to take her medication and requires a lot of teaching and encouragement from nurses.   Subjective:  Patient seen during treatment team, and again one-on-one in office. On assessment patient reports "This is not right place for me to be. I cannot be here. It make me feel worse". She continues to express paranoia that staff will "knock her out" with medications in order to take advantage of her while she is asleep. While she was in the office, I pulled her chart up so she could see each medication she is prescribed, and verified they were the medications she took prior to admission. Senna discontinued per patient request. She continues to request discharge. Explained that she is currently not progressing, and it is too soon to give her a target discharge date. Encouraged medication compliance and group attendance.      Labs: No new labs to review  Total Time spent with patient: 45 min  Past Psychiatric History: Multiple inpatient hospitilizations. Most recently in August 2021 at [facility] in [place] where she was hospitalized for 2 weeks. She sees Dr. Gilmore Laroche for outpatient management. She was most recently stabilized on Geodon 40 mg QHS, Lithium CR 900 mg QHS, and cogentin 1 mg QHS. Past  medication trials including Seroquel, Abilify, Zyprexa, Saphris, Fanapt, Invega, Risperdal, Tegretol. Lamictal, and Trazodone. History of suicide attempt via overdose in 2014.   Past Medical History:  Past Medical History:  Diagnosis Date  . Anxiety   . Chronic bipolar disorder (HCC)   . Depression   . H/O suicide attempt 05-2013   OD  . Obesity   . OCD (obsessive compulsive disorder)   . Psychiatric diagnosis    History reviewed. No pertinent surgical history. Family History:  Family History  Problem Relation Age of Onset  . Hypertension Mother   . Depression Father   . Hypertension Father   . Bipolar disorder Father   . Hypertension Maternal Grandmother   . Obesity Maternal Grandmother   . Hypertension Paternal Grandmother    Family Psychiatric  History: Father with bipolar disorder  Social History:  Social History   Substance and Sexual Activity  Alcohol Use No     Social History   Substance and Sexual Activity  Drug Use No    Social History   Socioeconomic History  . Marital status: Married    Spouse name: [name]  . Number of children: 0  . Years of education: Not on file  . Highest education level: Not on file  Occupational History  . Occupation: unemployed  Tobacco Use  . Smoking status: Never Smoker  . Smokeless tobacco: Never Used  Vaping Use  . Vaping Use: Never used  Substance and Sexual Activity  . Alcohol use: No  . Drug use: No  . Sexual activity: Yes    Birth control/protection: I.U.D.  Other Topics Concern  . Not on  file  Social History Narrative   Lives with husband   Right handed   Drinks caffeine seldomly   Social Determinants of Health   Financial Resource Strain: Not on file  Food Insecurity: Not on file  Transportation Needs: Not on file  Physical Activity: Not on file  Stress: Not on file  Social Connections: Not on file   Additional Social History:                         Sleep: Poor  Appetite:   Fair  Current Medications: Current Facility-Administered Medications  Medication Dose Route Frequency Provider Last Rate Last Admin  . acetaminophen (TYLENOL) tablet 650 mg  650 mg Oral Q6H PRN Clapacs, John T, MD      . alum & mag hydroxide-simeth (MAALOX/MYLANTA) 200-200-20 MG/5ML suspension 30 mL  30 mL Oral Q4H PRN Clapacs, John T, MD      . benztropine (COGENTIN) tablet 1 mg  1 mg Oral QHS Jesse Sans, MD   1 mg at 11/10/20 2052  . docusate sodium (COLACE) capsule 100 mg  100 mg Oral BID Clapacs, Jackquline Denmark, MD   100 mg at 11/11/20 0817  . feeding supplement (ENSURE ENLIVE / ENSURE PLUS) liquid 237 mL  237 mL Oral BID BM Jesse Sans, MD      . lithium carbonate (ESKALITH) CR tablet 900 mg  900 mg Oral QHS Clapacs, Jackquline [place], MD   900 mg at 11/10/20 2053  . magnesium hydroxide (MILK OF MAGNESIA) suspension 30 mL  30 mL Oral Daily PRN Clapacs, John T, MD   30 mL at 11/10/20 1000  . multivitamin with minerals tablet 1 tablet  1 tablet Oral Daily Jesse Sans, MD   1 tablet at 11/11/20 [phone]  . OLANZapine zydis (ZYPREXA) disintegrating tablet 10 mg  10 mg Oral BID PRN Clapacs, Jackquline Denmark, MD   10 mg at 11/10/20 1811  . ziprasidone (GEODON) capsule 20 mg  20 mg Oral QPC breakfast Thalia Party, MD   20 mg at 11/11/20 0818  . ziprasidone (GEODON) capsule 40 mg  40 mg Oral QHS Thalia Party, MD   40 mg at 11/10/20 2052  . ziprasidone (GEODON) injection 20 mg  20 mg Intramuscular Daily PRN Clapacs, Jackquline Denmark, MD        Lab Results:  No results found for this or any previous visit (from the past 48 hour(s)).  Blood Alcohol level:  Lab Results  Component Value Date   ETH <10 11/06/2020   ETH <10 06/30/2020    Metabolic Disorder Labs: Lab Results  Component Value Date   HGBA1C 4.9 11/09/2020   MPG 93.93 11/09/2020   MPG 100 09/03/2014   Lab Results  Component Value Date   PROLACTIN 34.1 (H) 03/11/2020   Lab Results  Component Value Date   CHOL 141 11/09/2020   TRIG 59 11/09/2020    HDL 45 11/09/2020   CHOLHDL 3.1 11/09/2020   VLDL 12 11/09/2020   LDLCALC 84 11/09/2020   LDLCALC 88 04/21/2020    Physical Findings: AIMS: Facial and Oral Movements Muscles of Facial Expression: None, normal Lips and Perioral Area: None, normal Jaw: None, normal Tongue: None, normal,Extremity Movements Upper (arms, wrists, hands, fingers): None, normal Lower (legs, knees, ankles, toes): None, normal, Trunk Movements Neck, shoulders, hips: None, normal, Overall Severity Severity of abnormal movements (highest score from questions above): None, normal Incapacitation due to abnormal movements: None, normal Patient's awareness  of abnormal movements (rate only patient's report): No Awareness, Dental Status Current problems with teeth and/or dentures?: No Does patient usually wear dentures?: No  CIWA:    COWS:     Musculoskeletal: Strength & Muscle Tone: within normal limits Gait & Station: normal Patient leans: N/A  Psychiatric Specialty Exam: Physical Exam Vitals and nursing note reviewed.  Constitutional:      Appearance: Normal appearance.  HENT:     Head: Normocephalic and atraumatic.     Right Ear: External ear normal.     Left Ear: External ear normal.     Nose: Nose normal.     Mouth/Throat:     Mouth: Mucous membranes are moist.     Pharynx: Oropharynx is clear.  Eyes:     Extraocular Movements: Extraocular movements intact.     Conjunctiva/sclera: Conjunctivae normal.     Pupils: Pupils are equal, round, and reactive to light.  Cardiovascular:     Rate and Rhythm: Normal rate.     Pulses: Normal pulses.  Pulmonary:     Effort: Pulmonary effort is normal.     Breath sounds: Normal breath sounds.  Abdominal:     General: Abdomen is flat.     Palpations: Abdomen is soft.  Musculoskeletal:        General: No swelling. Normal range of motion.     Cervical back: Normal range of motion and neck supple.  Skin:    General: Skin is warm and dry.  Neurological:      General: No focal deficit present.     Mental Status: She is alert and oriented to person, place, and time.  Psychiatric:        Attention and Perception: She is inattentive.        Mood and Affect: Mood is anxious. Affect is flat.        Speech: Speech is delayed.        Behavior: Behavior is slowed and withdrawn.        Thought Content: Thought content is paranoid and delusional.        Cognition and Memory: Cognition and memory normal.        Judgment: Judgment is inappropriate.     Review of Systems  Constitutional: Negative for appetite change and fatigue.  HENT: Negative for rhinorrhea and sore throat.   Eyes: Negative for photophobia and visual disturbance.  Respiratory: Negative for cough and shortness of breath.   Cardiovascular: Negative for chest pain and palpitations.  Gastrointestinal: Positive for constipation. Negative for diarrhea, nausea and vomiting.  Endocrine: Negative for cold intolerance and heat intolerance.  Genitourinary: Negative for difficulty urinating and dysuria.  Musculoskeletal: Negative for arthralgias and myalgias.  Skin: Negative for rash and wound.  Allergic/Immunologic: Negative for environmental allergies.  Neurological: Negative for dizziness and headaches.  Hematological: Negative for adenopathy. Does not bruise/bleed easily.  Psychiatric/Behavioral: Positive for agitation, behavioral problems and decreased concentration. Negative for hallucinations and suicidal ideas.    Blood pressure 114/77, pulse (!) 105, temperature 97.7 F (36.5 C), temperature source Oral, resp. rate 18, height 5\' 4"  (1.626 m), weight 75.8 kg, SpO2 100 %.Body mass index is 28.67 kg/m.  General Appearance: Guarded  Eye Contact:  Fair  Speech:  Pressured  Volume:  Decreased  Mood:  Anxious and Dysphoric  Affect:  Constricted  Thought Process:  Disorganized  Orientation:  Full (Time, Place, and Person)  Thought Content:  Illogical, Delusions and Paranoid Ideation   Suicidal Thoughts:  No  Homicidal  Thoughts:  No  Memory:  Immediate;   Fair Recent;   Fair Remote;   NA  Judgement:  Impaired  Insight:  Lacking  Psychomotor Activity:  Restlessness  Concentration:  Concentration: Poor and Attention Span: Poor  Recall:  NA  Fund of Knowledge:  Fair  Language:  Fair  Akathisia:  No  Handed:  Right  AIMS (if indicated):     Assets:  Desire for Improvement Physical Health  ADL's:  Intact  Cognition:  Impaired,  Mild  Sleep:  Number of Hours: 6     Treatment Plan Summary: Daily contact with patient to assess and evaluate symptoms and progress in treatment and Medication management   Patient is a 29 year old female with the above-stated past psychiatric history who is seen in follow-up.  Chart reviewed. Patient discussed with nursing. Patient continues to express paranoid persecutory delusions; no aggressive or agitated behavior in the unit. Compliance with medications encouraged. Continue morning dose of antipsychotic (Geodon  QAM), continue nighttime medications (Geodon  HS, Lithium  HS).  Plan: -continue inpatient psych admission; 15-minute checks; daily contact with patient to assess and evaluate symptoms and progress in treatment; psychoeducation.  -continue scheduled medications: add AM dose of Geodon. . benztropine  1 mg Oral QHS  . docusate sodium  100 mg Oral BID  . feeding supplement  237 mL Oral BID BM  . lithium carbonate  900 mg Oral QHS  . multivitamin with minerals  1 tablet Oral Daily  . ziprasidone  20 mg Oral QPC breakfast  . ziprasidone  40 mg Oral QHS   -continue PRN medications.  acetaminophen, alum & mag hydroxide-simeth, magnesium hydroxide, OLANZapine zydis, ziprasidone  -Pertinent Labs: lipid panel reviewed - WNL. Li level 12/08 - 0.48mmol/L.  -EKG: on 12/9 showed Qtc 417.    -Consults: No new consults placed since yesterday    -Disposition: patient is not ready for d/c yet. All necessary aftercare  will be arranged prior to discharge Likely d/c home with outpatient psych follow-up.  -  I certify that the patient does need, on a daily basis, active treatment furnished directly by or requiring the supervision of inpatient psychiatric facility personnel.   Jesse Sans, MD 11/11/2020, 10:22 AM

## 2020-11-11 NOTE — Progress Notes (Signed)
Patient states she is afraid to go to her room and sleep. Patient reassured that she is safe on the unit. Patient standing outside of the nursing station. Patient given encouragement and reassured that she is safe here on the unit.

## 2020-11-11 NOTE — Plan of Care (Signed)
Patient continued to be paranoid stated that she is scared to sleep in her room because she don't feel safe here. Patient did not eat dinner states " I don't want to take responsibility for that tray. I will eat when I get home." Patient rated  her depression  0/10 and anxiety 7/10 stated that because she is here. Patient is fixated on getting discharge.Denies SI,HI and AVH. Compliant with medications.Attended groups.Visible in the milieu.Minimal interactions with peers.Support and encouragement given.

## 2020-11-11 NOTE — [facility] Group Notes (Signed)
LCSW Group Therapy Note   11/11/2020 2:31 PM  Type of Therapy and Topic:  Group Therapy:  Overcoming Obstacles   Participation Level:  Minimal   Description of Group:    In this group patients will be encouraged to explore what they see as obstacles to their own wellness and recovery. They will be guided to discuss their thoughts, feelings, and behaviors related to these obstacles. The group will process together ways to cope with barriers, with attention given to specific choices patients can make. Each patient will be challenged to identify changes they are motivated to make in order to overcome their obstacles. This group will be process-oriented, with patients participating in exploration of their own experiences as well as giving and receiving support and challenge from other group members.   Therapeutic Goals: 1. Patient will identify personal and current obstacles as they relate to admission. 2. Patient will identify barriers that currently interfere with their wellness or overcoming obstacles.  3. Patient will identify feelings, thought process and behaviors related to these barriers. 4. Patient will identify two changes they are willing to make to overcome these obstacles:      Summary of Patient Progress Patient was present for group.  Patient slept in the beginning of the group. Patient later expressed paranoia.  She reported feeling like people were following her. She reports that she is afraid of her room.  Patient also reported that she feels ignored by staff.  CSW tried to reframe the situation. Patient later began to state that she had personal issues however, whispered to another patient her issues and had that patient state this patient's concerns.     Therapeutic Modalities:   Cognitive Behavioral Therapy Solution Focused Therapy Motivational Interviewing Relapse Prevention Therapy  Penni Homans, MSW, LCSW 11/11/2020 2:31 PM

## 2020-11-11 NOTE — Progress Notes (Signed)
Received called from [facility] "PJ", patient's husband [phone]: He reports that Natalie [name] is still quite paranoid when she speaks to him on the phone. He notes that when she is not taking her medications she becomes paranoid, does not sleep, starts thinking he is not her husband, and worries that people are trying to poison and attack her.

## 2020-11-11 NOTE — [facility] Counselor (Signed)
CSW checked in with the patient on aftercare.  She asked for information on providers and CSW provided the patient with psycho-education.   Patient declined aftercare at this time. CSW will continue to follow up with the patient.   Penni Homans, MSW, LCSW 11/11/2020 3:05 PM

## 2020-11-11 NOTE — Tx Team (Addendum)
Interdisciplinary Treatment and Diagnostic Plan Update  11/11/2020 Time of Session: 9:00AM Natalie [name] MRN: [id number]  Principal Diagnosis: Bipolar affective disorder, current episode manic with psychotic symptoms (HCC)  Secondary Diagnoses: Principal Problem:   Bipolar affective disorder, current episode manic with psychotic symptoms (HCC) Active Problems:   OCD (obsessive compulsive disorder)   Current Medications:  Current Facility-Administered Medications  Medication Dose Route Frequency Provider Last Rate Last Admin   acetaminophen (TYLENOL) tablet 650 mg  650 mg Oral Q6H PRN Clapacs, Jackquline Denmark, MD       alum & mag hydroxide-simeth (MAALOX/MYLANTA) 200-200-20 MG/5ML suspension 30 mL  30 mL Oral Q4H PRN Clapacs, Jackquline Denmark, MD       benztropine (COGENTIN) tablet 1 mg  1 mg Oral QHS Jesse Sans, MD   1 mg at 11/10/20 2052   docusate sodium (COLACE) capsule 100 mg  100 mg Oral BID Clapacs, John T, MD   100 mg at 11/11/20 0817   feeding supplement (ENSURE ENLIVE / ENSURE PLUS) liquid 237 mL  237 mL Oral BID BM Jesse Sans, MD       lithium carbonate (ESKALITH) CR tablet 900 mg  900 mg Oral QHS Clapacs, John T, MD   900 mg at 11/10/20 2053   magnesium hydroxide (MILK OF MAGNESIA) suspension 30 mL  30 mL Oral Daily PRN Clapacs, John T, MD   30 mL at 11/10/20 1000   multivitamin with minerals tablet 1 tablet  1 tablet Oral Daily Jesse Sans, MD   1 tablet at 11/11/20 0817   OLANZapine zydis (ZYPREXA) disintegrating tablet 10 mg  10 mg Oral BID PRN Clapacs, Jackquline Denmark, MD   10 mg at 11/10/20 1811   senna-docusate (Senokot-S) tablet 1 tablet  1 tablet Oral QHS Clapacs, Jackquline Denmark, MD   1 tablet at 11/10/20 2052   ziprasidone (GEODON) capsule 20 mg  20 mg Oral QPC breakfast Thalia Party, MD   20 mg at 11/11/20 0818   ziprasidone (GEODON) capsule 40 mg  40 mg Oral QHS Thalia Party, MD   40 mg at 11/10/20 2052   ziprasidone (GEODON) injection 20 mg  20 mg Intramuscular Daily  PRN Clapacs, Jackquline Denmark, MD       PTA Medications: Medications Prior to Admission  Medication Sig Dispense Refill Last Dose   benztropine (COGENTIN) 0.5 MG tablet Take 1 tablet (0.5 mg total) by mouth 2 (two) times daily as needed for tremors. (Patient taking differently: Take 1 mg by mouth at bedtime. ) 60 tablet 0    docusate sodium (COLACE) 100 MG capsule Take 1 capsule (100 mg total) by mouth 2 (two) times daily. For constipation (Patient not taking: Reported on 11/06/2020) 60 capsule 0    lithium carbonate (ESKALITH) 450 MG CR tablet Take 2 tablets (900 mg total) by mouth at bedtime. For mood stabilization (Patient taking differently: Take 900 mg by mouth at bedtime. ) 60 tablet 0    senna (SENOKOT) 8.6 MG TABS tablet Take every other day after iron (Patient taking differently: Take 1-2 tablets by mouth daily as needed for mild constipation. ) 120 tablet 0    senna-docusate (SENOKOT-S) 8.6-50 MG tablet Take 1 tablet by mouth at bedtime. (May buy from over the counter): For constipation (Patient not taking: Reported on 11/06/2020) 1 tablet 0    ziprasidone (GEODON) 40 MG capsule Take 40 mg by mouth at bedtime.       Patient Stressors: Loss of no details given Marital or  family conflict Medication change or noncompliance  Patient Strengths: Barrister's clerk for treatment/growth  Treatment Modalities: Medication Management, Group therapy, Case management,  1 to 1 session with clinician, Psychoeducation, Recreational therapy.   Physician Treatment Plan for Primary Diagnosis: Bipolar affective disorder, current episode manic with psychotic symptoms (HCC) Long Term Goal(s): Improvement in symptoms so as ready for discharge Improvement in symptoms so as ready for discharge   Short Term Goals: Ability to identify changes in lifestyle to reduce recurrence of condition will improve Ability to verbalize feelings will improve Ability to disclose and discuss suicidal  ideas Ability to demonstrate self-control will improve Ability to identify and develop effective coping behaviors will improve Ability to maintain clinical measurements within normal limits will improve Compliance with prescribed medications will improve Ability to identify changes in lifestyle to reduce recurrence of condition will improve Ability to verbalize feelings will improve Ability to disclose and discuss suicidal ideas Ability to demonstrate self-control will improve Ability to identify and develop effective coping behaviors will improve Ability to maintain clinical measurements within normal limits will improve Compliance with prescribed medications will improve  Medication Management: Evaluate patient's response, side effects, and tolerance of medication regimen.  Therapeutic Interventions: 1 to 1 sessions, Unit Group sessions and Medication administration.  Evaluation of Outcomes: Progressing  Physician Treatment Plan for Secondary Diagnosis: Principal Problem:   Bipolar affective disorder, current episode manic with psychotic symptoms (HCC) Active Problems:   OCD (obsessive compulsive disorder)  Long Term Goal(s): Improvement in symptoms so as ready for discharge Improvement in symptoms so as ready for discharge   Short Term Goals: Ability to identify changes in lifestyle to reduce recurrence of condition will improve Ability to verbalize feelings will improve Ability to disclose and discuss suicidal ideas Ability to demonstrate self-control will improve Ability to identify and develop effective coping behaviors will improve Ability to maintain clinical measurements within normal limits will improve Compliance with prescribed medications will improve Ability to identify changes in lifestyle to reduce recurrence of condition will improve Ability to verbalize feelings will improve Ability to disclose and discuss suicidal ideas Ability to demonstrate self-control will  improve Ability to identify and develop effective coping behaviors will improve Ability to maintain clinical measurements within normal limits will improve Compliance with prescribed medications will improve     Medication Management: Evaluate patient's response, side effects, and tolerance of medication regimen.  Therapeutic Interventions: 1 to 1 sessions, Unit Group sessions and Medication administration.  Evaluation of Outcomes: Progressing   RN Treatment Plan for Primary Diagnosis: Bipolar affective disorder, current episode manic with psychotic symptoms (HCC) Long Term Goal(s): Knowledge of disease and therapeutic regimen to maintain health will improve  Short Term Goals: Ability to demonstrate self-control, Ability to participate in decision making will improve, Ability to verbalize feelings will improve, Ability to disclose and discuss suicidal ideas and Ability to identify and develop effective coping behaviors will improve  Medication Management: RN will administer medications as ordered by provider, will assess and evaluate patient's response and provide education to patient for prescribed medication. RN will report any adverse and/or side effects to prescribing provider.  Therapeutic Interventions: 1 on 1 counseling sessions, Psychoeducation, Medication administration, Evaluate responses to treatment, Monitor vital signs and CBGs as ordered, Perform/monitor CIWA, COWS, AIMS and Fall Risk screenings as ordered, Perform wound care treatments as ordered.  Evaluation of Outcomes: Progressing   LCSW Treatment Plan for Primary Diagnosis: Bipolar affective disorder, current episode manic with psychotic symptoms (HCC) Long Term Goal(s):  Safe transition to appropriate next level of care at discharge, Engage patient in therapeutic group addressing interpersonal concerns.  Short Term Goals: Engage patient in aftercare planning with referrals and resources, Increase social support, Increase  ability to appropriately verbalize feelings, Increase emotional regulation, Facilitate acceptance of mental health diagnosis and concerns and Increase skills for wellness and recovery  Therapeutic Interventions: Assess for all discharge needs, 1 to 1 time with Social worker, Explore available resources and support systems, Assess for adequacy in community support network, Educate family and significant other(s) on suicide prevention, Complete Psychosocial Assessment, Interpersonal group therapy.  Evaluation of Outcomes: Progressing   Progress in Treatment: Attending groups: Yes. Participating in groups: Yes. Taking medication as prescribed: No. Toleration medication: Yes. Family/Significant other contact made: Yes, individual(s) contacted:  SPE completed with the patient's husband. Patient understands diagnosis: Yes. Discussing patient identified problems/goals with staff: Yes. Medical problems stabilized or resolved: Yes. Denies suicidal/homicidal ideation: Yes. Issues/concerns per patient self-inventory: No. Other: none  New problem(s) identified: No, Describe:  none  New Short Term/Long Term Goal(s): elimination of symptoms of psychosis, medication management for mood stabilization; elimination of SI thoughts; development of comprehensive mental wellness plan.  Patient Goals:  "to go home"  Discharge Plan or Barriers: CSW will assist pt in developing appropriate discharge plans.    Reason for Continuation of Hospitalization: Anxiety Depression Medication stabilization Suicidal ideation  Estimated Length of Stay:  1-7 days  Recreational Therapy: Patient Stressors: N/A Patient Goal: Patient will engage in groups without prompting or encouragement from LRT x3 group sessions within 5 recreation therapy group sessions.   Attendees: Patient:Natalie [name] 11/11/2020 9:24 AM  Physician: Dr. Neale Burly, MD 11/11/2020 9:24 AM  Nursing: Doyce Para, RN 11/11/2020 9:24 AM   RN Care Manager: 11/11/2020 9:24 AM  Social Worker: Penni Homans, MSW, LCSW 11/11/2020 9:24 AM  Recreational Therapist: Garret Reddish, Drue Flirt, LRT 11/11/2020 9:24 AM  Other: Gwenevere Ghazi, MSW, [place], LCASA 11/11/2020 9:24 AM  Other: Jillyn Hidden, LCSW 11/11/2020 9:24 AM  Other: 11/11/2020 9:24 AM    Scribe for Treatment Team: Harden Mo, LCSW 11/11/2020 9:24 AM

## 2020-11-11 NOTE — Plan of Care (Signed)
Patient presents with paranoia   Problem: Education: Goal: Emotional status will improve Outcome: Not Progressing Goal: Mental status will improve Outcome: Not Progressing   

## 2020-11-11 NOTE — Progress Notes (Signed)
Recreation Therapy Notes  Date: 11/11/2020  Time: 9:30 am   Location: Craft room     Behavioral response: N/A   Intervention Topic: Necessities   Discussion/Intervention: Patient did not attend group.   Clinical Observations/Feedback:  Patient did not attend group.   Aizza Santiago LRT/CTRS        Timathy Newberry 11/11/2020 1:23 PM

## 2020-11-11 NOTE — Progress Notes (Signed)
Patient presents with paranoia stating, "People look at me when I get water or food acting like I need to pay for it." Patient given education, support, and encouragement to be active in her treatment plan. Patient denies SI/HI/AVH. Patient endorses anxiety stating it's from being in here. Patient being monitored Q 15 minutes for safety per unit protocol. Pt remains safe on the unit.

## 2020-11-12 DIAGNOSIS — F312 Bipolar disorder, current episode manic severe with psychotic features: Principal | ICD-10-CM

## 2020-11-12 MED ORDER — SIMETHICONE 80 MG PO CHEW
80.0000 mg | CHEWABLE_TABLET | Freq: Four times a day (QID) | ORAL | Status: DC | PRN
  Administered 2020-11-16 – 2020-11-17 (×2): 80 mg via ORAL
  Filled 2020-11-12 (×3): qty 1

## 2020-11-12 MED ORDER — ZIPRASIDONE HCL 40 MG PO CAPS
60.0000 mg | ORAL_CAPSULE | Freq: Every day | ORAL | Status: DC
  Administered 2020-11-12: 21:00:00 60 mg via ORAL
  Filled 2020-11-12: qty 1

## 2020-11-12 NOTE — Plan of Care (Signed)
Patient presents with paranoia   Problem: Education: Goal: Emotional status will improve Outcome: Not Progressing Goal: Mental status will improve Outcome: Not Progressing   

## 2020-11-12 NOTE — Progress Notes (Signed)
[facility] MD Progress Note  11/12/2020 9:41 AM Natalie [name]  MRN:  [id number]   Principal Problem: Bipolar affective disorder, current episode manic with psychotic symptoms (HCC) Diagnosis: Principal Problem:   Bipolar affective disorder, current episode manic with psychotic symptoms (HCC) Active Problems:   OCD (obsessive compulsive disorder)  Natalie [name] is a 29 y.o. female patient who presents to the [facility] unit for treatment of worsened paranoia and depression in the context of psychotropic medication non-adherence.   Interval History Patient was seen today for re-evaluation.  Nursing reports that patient was anxious overnight, and feeling unsafe to go to her room and sleep. She feels that people are following her on the unit, and trying to hurt her. She also expressed paranoia about food and water.   Subjective:  Patient seen one-on-one in office. On assessment patient reports "I kind of acted out last night, but it is just the hospital. The hospital makes me worse. If I were at home I'd sleep and eat." She continues to express concerns that staff is trying to poison her with her medications. She states they are giving her a vitamin that she does not believe is actually a vitamin. Time spent pulling up her chart to walk through her medications one by one, and pointed out her multivitamin that was ordered for nutritional support. Discussed plan to increase Geodon tonight, and patient agreeable to plan. She continues to request discharge. Explained that she is currently not progressing, and it is too soon to give her a target discharge date. Encouraged medication compliance and group attendance.      Labs: No new labs to review  Total Time spent with patient: 30 min  Past Psychiatric History: Multiple inpatient hospitilizations. Most recently in August 2021 at [facility] in [place] where she was hospitalized for 2 weeks. She sees Dr. Gilmore Laroche for outpatient management. She was most recently  stabilized on Geodon 40 mg QHS, Lithium CR 900 mg QHS, and cogentin 1 mg QHS. Past medication trials including Seroquel, Abilify, Zyprexa, Saphris, Fanapt, Invega, Risperdal, Tegretol. Lamictal, and Trazodone. History of suicide attempt via overdose in 2014.   Past Medical History:  Past Medical History:  Diagnosis Date  . Anxiety   . Chronic bipolar disorder (HCC)   . Depression   . H/O suicide attempt 05-2013   OD  . Obesity   . OCD (obsessive compulsive disorder)   . Psychiatric diagnosis    History reviewed. No pertinent surgical history. Family History:  Family History  Problem Relation Age of Onset  . Hypertension Mother   . Depression Father   . Hypertension Father   . Bipolar disorder Father   . Hypertension Maternal Grandmother   . Obesity Maternal Grandmother   . Hypertension Paternal Grandmother    Family Psychiatric  History: Father with bipolar disorder  Social History:  Social History   Substance and Sexual Activity  Alcohol Use No     Social History   Substance and Sexual Activity  Drug Use No    Social History   Socioeconomic History  . Marital status: Married    Spouse name: [name]  . Number of children: 0  . Years of education: Not on file  . Highest education level: Not on file  Occupational History  . Occupation: unemployed  Tobacco Use  . Smoking status: Never Smoker  . Smokeless tobacco: Never Used  Vaping Use  . Vaping Use: Never used  Substance and Sexual Activity  . Alcohol use: No  . Drug  use: No  . Sexual activity: Yes    Birth control/protection: I.U.D.  Other Topics Concern  . Not on file  Social History Narrative   Lives with husband   Right handed   Drinks caffeine seldomly   Social Determinants of Health   Financial Resource Strain: Not on file  Food Insecurity: Not on file  Transportation Needs: Not on file  Physical Activity: Not on file  Stress: Not on file  Social Connections: Not on file   Additional  Social History:                         Sleep: Poor  Appetite:  Fair  Current Medications: Current Facility-Administered Medications  Medication Dose Route Frequency Provider Last Rate Last Admin  . acetaminophen (TYLENOL) tablet 650 mg  650 mg Oral Q6H PRN Clapacs, John T, MD      . alum & mag hydroxide-simeth (MAALOX/MYLANTA) 200-200-20 MG/5ML suspension 30 mL  30 mL Oral Q4H PRN Clapacs, John T, MD      . benztropine (COGENTIN) tablet 1 mg  1 mg Oral QHS Jesse Sans, MD   1 mg at 11/11/20 2102  . docusate sodium (COLACE) capsule 100 mg  100 mg Oral BID Clapacs, Jackquline Denmark, MD   100 mg at 11/12/20 0759  . feeding supplement (ENSURE ENLIVE / ENSURE PLUS) liquid 237 mL  237 mL Oral BID BM Jesse Sans, MD   237 mL at 11/11/20 1451  . lithium carbonate (ESKALITH) CR tablet 900 mg  900 mg Oral QHS Clapacs, Jackquline Denmark, MD   900 mg at 11/11/20 2102  . magnesium hydroxide (MILK OF MAGNESIA) suspension 30 mL  30 mL Oral Daily PRN Clapacs, John T, MD   30 mL at 11/10/20 1000  . multivitamin with minerals tablet 1 tablet  1 tablet Oral Daily Jesse Sans, MD   1 tablet at 11/12/20 0759  . OLANZapine zydis (ZYPREXA) disintegrating tablet 10 mg  10 mg Oral BID PRN Clapacs, Jackquline Denmark, MD   10 mg at 11/10/20 1811  . ziprasidone (GEODON) capsule 20 mg  20 mg Oral QPC breakfast Thalia Party, MD   20 mg at 11/11/20 0818  . ziprasidone (GEODON) capsule 60 mg  60 mg Oral QHS Jesse Sans, MD      . ziprasidone (GEODON) injection 20 mg  20 mg Intramuscular Daily PRN Clapacs, Jackquline Denmark, MD        Lab Results:  No results found for this or any previous visit (from the past 48 hour(s)).  Blood Alcohol level:  Lab Results  Component Value Date   ETH <10 11/06/2020   ETH <10 06/30/2020    Metabolic Disorder Labs: Lab Results  Component Value Date   HGBA1C 4.9 11/09/2020   MPG 93.93 11/09/2020   MPG 100 09/03/2014   Lab Results  Component Value Date   PROLACTIN 34.1 (H) 03/11/2020    Lab Results  Component Value Date   CHOL 141 11/09/2020   TRIG 59 11/09/2020   HDL 45 11/09/2020   CHOLHDL 3.1 11/09/2020   VLDL 12 11/09/2020   LDLCALC 84 11/09/2020   LDLCALC 88 04/21/2020    Physical Findings: AIMS: Facial and Oral Movements Muscles of Facial Expression: None, normal Lips and Perioral Area: None, normal Jaw: None, normal Tongue: None, normal,Extremity Movements Upper (arms, wrists, hands, fingers): None, normal Lower (legs, knees, ankles, toes): None, normal, Trunk Movements Neck, shoulders, hips: None, normal,  Overall Severity Severity of abnormal movements (highest score from questions above): None, normal Incapacitation due to abnormal movements: None, normal Patient's awareness of abnormal movements (rate only patient's report): No Awareness, Dental Status Current problems with teeth and/or dentures?: No Does patient usually wear dentures?: No  CIWA:    COWS:     Musculoskeletal: Strength & Muscle Tone: within normal limits Gait & Station: normal Patient leans: N/A  Psychiatric Specialty Exam: Physical Exam Vitals and nursing note reviewed.  Constitutional:      Appearance: Normal appearance.  HENT:     Head: Normocephalic and atraumatic.     Right Ear: External ear normal.     Left Ear: External ear normal.     Nose: Nose normal.     Mouth/Throat:     Mouth: Mucous membranes are moist.     Pharynx: Oropharynx is clear.  Eyes:     Extraocular Movements: Extraocular movements intact.     Conjunctiva/sclera: Conjunctivae normal.     Pupils: Pupils are equal, round, and reactive to light.  Cardiovascular:     Rate and Rhythm: Normal rate.     Pulses: Normal pulses.  Pulmonary:     Effort: Pulmonary effort is normal.     Breath sounds: Normal breath sounds.  Abdominal:     General: Abdomen is flat.     Palpations: Abdomen is soft.  Musculoskeletal:        General: No swelling. Normal range of motion.     Cervical back: Normal range  of motion and neck supple.  Skin:    General: Skin is warm and dry.  Neurological:     General: No focal deficit present.     Mental Status: She is alert and oriented to person, place, and time.  Psychiatric:        Attention and Perception: She is inattentive.        Mood and Affect: Mood is anxious. Affect is flat.        Speech: Speech is delayed.        Behavior: Behavior is slowed and withdrawn.        Thought Content: Thought content is paranoid and delusional.        Cognition and Memory: Cognition and memory normal.        Judgment: Judgment is inappropriate.     Review of Systems  Constitutional: Negative for appetite change and fatigue.  HENT: Negative for rhinorrhea and sore throat.   Eyes: Negative for photophobia and visual disturbance.  Respiratory: Negative for cough and shortness of breath.   Cardiovascular: Negative for chest pain and palpitations.  Gastrointestinal: Positive for constipation. Negative for diarrhea, nausea and vomiting.  Endocrine: Negative for cold intolerance and heat intolerance.  Genitourinary: Negative for difficulty urinating and dysuria.  Musculoskeletal: Negative for arthralgias and myalgias.  Skin: Negative for rash and wound.  Allergic/Immunologic: Negative for environmental allergies.  Neurological: Negative for dizziness and headaches.  Hematological: Negative for adenopathy. Does not bruise/bleed easily.  Psychiatric/Behavioral: Positive for agitation, behavioral problems and decreased concentration. Negative for hallucinations and suicidal ideas.    Blood pressure 127/88, pulse (!) 108, temperature 97.7 F (36.5 C), temperature source Oral, resp. rate 18, height  (1.626 m), weight 75.8 kg, SpO2 100 %.Body mass index is 28.67 kg/m.  General Appearance: Guarded  Eye Contact:  Fair  Speech:  Pressured  Volume:  Decreased  Mood:  Anxious and Dysphoric  Affect:  Constricted  Thought Process:  Disorganized  Orientation:  Full  (Time, Place, and Person)  Thought Content:  Illogical, Delusions and Paranoid Ideation  Suicidal Thoughts:  No  Homicidal Thoughts:  No  Memory:  Immediate;   Fair Recent;   Fair Remote;   NA  Judgement:  Impaired  Insight:  Lacking  Psychomotor Activity:  Restlessness  Concentration:  Concentration: Poor and Attention Span: Poor  Recall:  NA  Fund of Knowledge:  Fair  Language:  Fair  Akathisia:  No  Handed:  Right  AIMS (if indicated):     Assets:  Desire for Improvement Physical Health  ADL's:  Intact  Cognition:  Impaired,  Mild  Sleep:  Number of Hours: 0     Treatment Plan Summary: Daily contact with patient to assess and evaluate symptoms and progress in treatment and Medication management   Patient is a 29 year old female with the above-stated past psychiatric history who is seen in follow-up.  Chart reviewed. Patient discussed with nursing. Patient continues to express paranoid persecutory delusions; no aggressive behavior in the unit. Compliance with medications encouraged. Continue morning dose of antipsychotic (Geodon 20mg  QAM), increase  nighttime Geodon 60mg  HS, continue Lithium 900mg  HS (lithium level 0.8)  Plan: -continue inpatient psych admission; 15-minute checks; daily contact with patient to assess and evaluate symptoms and progress in treatment; psychoeducation.  -continue scheduled medications: increase nighttime Geodon . benztropine  1 mg Oral QHS  . docusate sodium  100 mg Oral BID  . feeding supplement  237 mL Oral BID BM  . lithium carbonate  900 mg Oral QHS  . multivitamin with minerals  1 tablet Oral Daily  . ziprasidone  20 mg Oral QPC breakfast  . ziprasidone  60 mg Oral QHS   -continue PRN medications.  acetaminophen, alum & mag hydroxide-simeth, magnesium hydroxide, OLANZapine zydis, ziprasidone  -Pertinent Labs: lipid panel reviewed - WNL. Li level 12/08 - 0.66mmol/L.  -EKG: on 12/9 showed Qtc 417.    -Consults: No new consults placed  since yesterday    -Disposition: patient is not ready for d/c yet. All necessary aftercare will be arranged prior to discharge Likely d/c home with outpatient psych follow-up.  -  I certify that the patient does need, on a daily basis, active treatment furnished directly by or requiring the supervision of inpatient psychiatric facility personnel.   14/08, MD 11/12/2020, 9:41 AM

## 2020-11-12 NOTE — Progress Notes (Signed)
Recreation Therapy Notes  Date: 11/12/2020  Time: 9:30 am   Location: Craft room  Behavioral response: Appropriate  Intervention Topic: Happiness   Discussion/Intervention:  Group content today was focused on Happiness. The group defined happiness and described where happiness comes from. Individuals identified what makes them happy and how they go about making others happy. Patients expressed things that stop them from being happy and ways they can improve their happiness. The group stated reasons why it is important to be happy. The group participated in the intervention "My Happiness", where they had a chance to identify and express things that make them happy.  Clinical Observations/Feedback: Patient came to group and was focused on what peers and staff had to say about happiness. Individual participated in the intervention.   Esraa Seres LRT/CTRS         Shebra Muldrow 11/12/2020 12:14 PM

## 2020-11-12 NOTE — Progress Notes (Signed)
Patient presents with a better affect than this writer has seen during this admission. Education, support, and encouragement given to be active in her treatment plan. Patient denies SI/HI/AVH. Patient endorses anxiety stating it's from being in here. Patient being monitored Q 15 minutes for safety per unit protocol. Pt remains safe on the unit. Patient is preoccupied with leaving, patient given education.

## 2020-11-12 NOTE — Progress Notes (Signed)
D: Pt alert and oriented. Pt rates depression 0/10, hopelessness 0/10, and anxiety 3/10. Pt goal: "Discharge." Pt reports energy level as normal and concentration as being good. Pt reports sleep last night as being good. Pt did receive medications for sleep. Pt denies experiencing any pain at this time. Pt denies experiencing any SI/HI, or AVH at this time.   Pt has eaten a very small amount of food for each meal. Pt continues to be paranoid and suspicious about meals and medications. Pt asked at evening medication pass if meals were paid for or if she had to pay for them. This Clinical research associate explained that hospital stays and meals are usually paid for by insurance however that was not my area of expertise and she could further as the MD/CSW for further guidance. Pt continues to pace and have moments of tearfulness.  A: Scheduled medications administered to pt, per MD orders. Support and encouragement provided. Frequent verbal contact made. Routine safety checks conducted q15 minutes.   R: No adverse drug reactions noted. Pt verbally contracts for safety at this time. Pt complaint with medications. Pt interacts minimally with others on the unit. Pt remains safe at this time. Will continue to monitor.

## 2020-11-12 NOTE — [facility] Group Notes (Signed)
LCSW Group Therapy Note  11/12/2020 2:27 PM  Type of Therapy/Topic:  Group Therapy:  Feelings about Diagnosis  Participation Level:  Minimal   Description of Group:   This group will allow patients to explore their thoughts and feelings about diagnoses they have received. Patients will be guided to explore their level of understanding and acceptance of these diagnoses. Facilitator will encourage patients to process their thoughts and feelings about the reactions of others to their diagnosis and will guide patients in identifying ways to discuss their diagnosis with significant others in their lives. This group will be process-oriented, with patients participating in exploration of their own experiences, giving and receiving support, and processing challenge from other group members.   Therapeutic Goals: 1. Patient will demonstrate understanding of diagnosis as evidenced by identifying two or more symptoms of the disorder 2. Patient will be able to express two feelings regarding the diagnosis 3. Patient will demonstrate their ability to communicate their needs through discussion and/or role play  Summary of Patient Progress: Patient was present for the entirety of the group session. Patient was lethargic during group discussion, dosing off w/ head down. After group, Patient apologized stating her medication had been changed making her sleepy. Patient shared that she believes she is doing better and anticipates getting less sleepy.     Therapeutic Modalities:   Cognitive Behavioral Therapy Brief Therapy Feelings Identification   Gwenevere Ghazi, MSW, [place], Bridget Hartshorn 11/12/2020 2:27 PM

## 2020-11-13 LAB — LITHIUM LEVEL: Lithium Lvl: 1.05 mmol/L (ref 0.60–1.20)

## 2020-11-13 MED ORDER — OLANZAPINE 10 MG PO TABS
10.0000 mg | ORAL_TABLET | Freq: Every day | ORAL | Status: DC
  Administered 2020-11-13 – 2020-11-14 (×2): 10 mg via ORAL
  Filled 2020-11-13 (×2): qty 1

## 2020-11-13 MED ORDER — ZIPRASIDONE HCL 40 MG PO CAPS
40.0000 mg | ORAL_CAPSULE | Freq: Every day | ORAL | Status: DC
  Administered 2020-11-13 – 2020-11-14 (×2): 40 mg via ORAL
  Filled 2020-11-13 (×2): qty 1

## 2020-11-13 NOTE — BHH Counselor (Signed)
CSW was approached by the patient. Patient began to report "I cant be in this room, I don't meet the requirements.  I'm not suited for this room."  CSW asked for clarification to the patient's statements.  Patient reports that "I don't have the clothes. I know a person that was in that room that changed clothes a lot. I don't have my toiletry items.  CSW informed patient that she would assist the patient in getting the asked for items.  Patient told CSW that this is not what she meant.   CSW asked for clarification and pt stared blankly.    CSW offered additional support, however, pt did not identify any.  Penni Homans, MSW, LCSW 11/13/2020 4:19 PM

## 2020-11-13 NOTE — [facility] Group Notes (Signed)
LCSW Group Therapy Note  11/13/2020 2:10 PM  Type of Therapy/Topic:  Group Therapy:  Emotion Regulation  Participation Level:  Did Not Attend   Description of Group:   The purpose of this group is to assist patients in learning to regulate negative emotions and experience positive emotions. Patients will be guided to discuss ways in which they have been vulnerable to their negative emotions. These vulnerabilities will be juxtaposed with experiences of positive emotions or situations, and patients will be challenged to use positive emotions to combat negative ones. Special emphasis will be placed on coping with negative emotions in conflict situations, and patients will process healthy conflict resolution skills.  Therapeutic Goals: 1. Patient will identify two positive emotions or experiences to reflect on in order to balance out negative emotions 2. Patient will label two or more emotions that they find the most difficult to experience 3. Patient will demonstrate positive conflict resolution skills through discussion and/or role plays  Summary of Patient Progress: X  Therapeutic Modalities:   Cognitive Behavioral Therapy Feelings Identification Dialectical Behavioral Therapy  Penni Homans, MSW, LCSW 11/13/2020 2:10 PM

## 2020-11-13 NOTE — Progress Notes (Signed)
Patient presents with a better affect than this writer has seen during this admission. Education, support, and encouragement given to be active in her treatment plan. Patient denies SI/HI/AVH. Patient endorses anxiety stating it's from being in here. Patient being monitored Q 15 minutes for safety per unit protocol. Pt remains safe on the unit. Patient is preoccupied with leaving, patient given education.  

## 2020-11-13 NOTE — Progress Notes (Signed)
Recreation Therapy Notes   Date: 11/13/2020  Time: 9:30 am   Location: Craft room  Behavioral response: Appropriate  Intervention Topic: Self-care    Discussion/Intervention:  Group content today was focused on Self-Care. The group defined self-care and some positive ways they care for themselves. Individuals expressed ways and reasons why they neglected any self-care in the past. Patients described ways to improve self-care in the future. The group explained what could happen if they did not do any self-care activities at all. The group participated in the intervention "self-care assessment" where they had a chance to discover some of their weaknesses and strengths in self- care. Patient came up with a self-care plan to improve themselves in the future.  Clinical Observations/Feedback: Patient came to group and defined self-care as setting goals for herself. Individual was social with peers and staff participating in the intervention.  Starr Urias LRT/CTRS         Aleatha Taite 11/13/2020 1:03 PM

## 2020-11-13 NOTE — Plan of Care (Signed)
Patient presents with paranoia   Problem: Education: Goal: Emotional status will improve Outcome: Not Progressing Goal: Mental status will improve Outcome: Not Progressing   

## 2020-11-13 NOTE — Plan of Care (Signed)
  Problem: Coping Skills Goal: STG - Patient will identify 3 positive coping skills strategies to use post d/c within 5 recreation therapy group sessions Description: STG - Patient will identify 3 positive coping skills strategies to use post d/c within 5 recreation therapy group sessions Outcome: Progressing   

## 2020-11-13 NOTE — Progress Notes (Signed)
D: Pt alert and oriented. Pt rates depression 0/10, hopelessness 0/10, and anxiety 0/10. Pt goal: "Discharge." Pt reports energy level as normal and concentration as being good. Pt reports sleep last night as being good. Pt did receive medications for sleep and did find them helpful. Pt reports experiencing 6/10 bilat foot pain r/t swelling (per pt), MD informed and exam preformed. [name] feet do not appear to be swollen. Pt denies experiencing any SI/HI, or AVH at this time.   Pt did not eat breakfast or dinner. Pt only ate applesauce for lunch. When asked at lunch time what she was going to eat the pt stated she wasn't hungry. When asked if it was the food because I could order her something else. Pt stated she didn't have an appetite. Pt was informed that she needed to eat and that her body needs nutrients and that if she didn't eat she would not likely be discharging. Pt asked MD to change the location of her room. MD notified this writer to change pt's room. This Clinical research associate did change pt's room to a location close to the nurse's station. Pt was unhappy with this change, wanted to know about this room. When asked what she meant the pt stated she wanted to know if there was anything special about this room. This Clinical research associate replied there is nothing different about this room, it has all the same things as the other room just the location is different. This pt even believes this Clinical research associate is conspiring with the MD to keep her here. Pt states this writer is lying on her and that she has been eating her meals.    A: Scheduled medications administered to pt, per MD orders. Support and encouragement provided. Frequent verbal contact made. Routine safety checks conducted q15 minutes.   R: No adverse drug reactions noted. Pt verbally contracts for safety at this time. Pt complaint with medications. Pt interacts minimally with others on the unit, mostly keeping to self, pacing around nurse's station, and sleeping in the dayroom  sitting up. Pt remains safe at this time. Will continue to monitor.

## 2020-11-13 NOTE — Progress Notes (Signed)
[facility] MD Progress Note  11/13/2020 2:15 PM Natalie [name]  MRN:  [id number]   Principal Problem: Bipolar affective disorder, current episode manic with psychotic symptoms (HCC) Diagnosis: Principal Problem:   Bipolar affective disorder, current episode manic with psychotic symptoms (HCC) Active Problems:   OCD (obsessive compulsive disorder)  [name] is a 29 y.o. female patient who presents to the [facility] unit for treatment of worsened paranoia and depression in the context of psychotropic medication non-adherence.   Interval History Patient was seen today for re-evaluation.  Nursing reports that patient was anxious overnight, and feeling unsafe to go to her room and sleep. She feels that people are following her on the unit, and trying to hurt her. She also expressed paranoia about food and water. She slept for less than 1 hour sitting up in a chair in the dayroom overnight.   Subjective:  Patient seen one-on-one in office. On assessment patient reports "I am fine. I slept great last night. I need to go home." When asked about her lack of sleep overnight she then confesses she did not sleep well. She cites being the only female on the hall as her reason for concern. Will move her to room by nurses station today. She denies that she has not been eating her meals despite multiple witnesses of her only eating very small amounts of food. She continues to feel that we are accusing her of stealing food. Discussed her lack of improvement with increasing Geodon, and discussed adding second antipsychotic at bedtime. She feels most comfortable taking Zyprexa at bedtime as she feels this helps her feel less anxious. She continues to request discharge. Explained that she is currently not progressing, and it is too soon to give her a target discharge date. Encouraged medication compliance and group attendance. She requests that I call PJ today.   Contacted PJ   At [phone]. He states that her paranoia and  sleep has not improved, and he feels the increase in Geodon has not helped. Discussed plan of care with him, and he is in agreement.      Labs: No new labs to review  Total Time spent with patient: 30 min  Past Psychiatric History: Multiple inpatient hospitilizations. Most recently in August 2021 at [facility] in [place] where she was hospitalized for 2 weeks. She sees Dr. Gilmore Laroche for outpatient management. She was most recently stabilized on Geodon 40 mg QHS, Lithium CR 900 mg QHS, and cogentin 1 mg QHS. Past medication trials including Seroquel, Abilify, Zyprexa, Saphris, Fanapt, Invega, Risperdal, Tegretol. Lamictal, and Trazodone. History of suicide attempt via overdose in 2014.   Past Medical History:  Past Medical History:  Diagnosis Date  . Anxiety   . Chronic bipolar disorder (HCC)   . Depression   . H/O suicide attempt 05-2013   OD  . Obesity   . OCD (obsessive compulsive disorder)   . Psychiatric diagnosis    History reviewed. No pertinent surgical history. Family History:  Family History  Problem Relation Age of Onset  . Hypertension Mother   . Depression Father   . Hypertension Father   . Bipolar disorder Father   . Hypertension Maternal Grandmother   . Obesity Maternal Grandmother   . Hypertension Paternal Grandmother    Family Psychiatric  History: Father with bipolar disorder  Social History:  Social History   Substance and Sexual Activity  Alcohol Use No     Social History   Substance and Sexual Activity  Drug Use No  Social History   Socioeconomic History  . Marital status: Married    Spouse name: [name]  . Number of children: 0  . Years of education: Not on file  . Highest education level: Not on file  Occupational History  . Occupation: unemployed  Tobacco Use  . Smoking status: Never Smoker  . Smokeless tobacco: Never Used  Vaping Use  . Vaping Use: Never used  Substance and Sexual Activity  . Alcohol use: No  . Drug use: No   . Sexual activity: Yes    Birth control/protection: I.U.D.  Other Topics Concern  . Not on file  Social History Narrative   Lives with husband   Right handed   Drinks caffeine seldomly   Social Determinants of Health   Financial Resource Strain: Not on file  Food Insecurity: Not on file  Transportation Needs: Not on file  Physical Activity: Not on file  Stress: Not on file  Social Connections: Not on file   Additional Social History:                         Sleep: Poor  Appetite:  Fair  Current Medications: Current Facility-Administered Medications  Medication Dose Route Frequency Provider Last Rate Last Admin  . acetaminophen (TYLENOL) tablet 650 mg  650 mg Oral Q6H PRN Clapacs, John T, MD      . benztropine (COGENTIN) tablet 1 mg  1 mg Oral QHS Jesse Sans, MD   1 mg at 11/12/20 2105  . docusate sodium (COLACE) capsule 100 mg  100 mg Oral BID Clapacs, Jackquline Denmark, MD   100 mg at 11/13/20 0803  . feeding supplement (ENSURE ENLIVE / ENSURE PLUS) liquid 237 mL  237 mL Oral BID BM Jesse Sans, MD   237 mL at 11/13/20 1052  . lithium carbonate (ESKALITH) CR tablet 900 mg  900 mg Oral QHS Clapacs, Jackquline Denmark, MD   900 mg at 11/12/20 2105  . magnesium hydroxide (MILK OF MAGNESIA) suspension 30 mL  30 mL Oral Daily PRN Clapacs, John T, MD   30 mL at 11/10/20 1000  . multivitamin with minerals tablet 1 tablet  1 tablet Oral Daily Jesse Sans, MD   1 tablet at 11/13/20 [id number]  . OLANZapine (ZYPREXA) tablet 10 mg  10 mg Oral QHS Jesse Sans, MD      . OLANZapine zydis [facility]) disintegrating tablet 10 mg  10 mg Oral BID PRN Clapacs, Jackquline Denmark, MD   10 mg at 11/10/20 1811  . simethicone (MYLICON) chewable tablet 80 mg  80 mg Oral QID PRN Jesse Sans, MD      . ziprasidone (GEODON) capsule 20 mg  20 mg Oral QPC breakfast Thalia Party, MD   20 mg at 11/13/20 0803  . ziprasidone (GEODON) capsule 40 mg  40 mg Oral QHS Jesse Sans, MD      . ziprasidone (GEODON)  injection 20 mg  20 mg Intramuscular Daily PRN Clapacs, Jackquline Denmark, MD        Lab Results:  Results for orders placed or performed during the hospital encounter of 11/08/20 (from the past 48 hour(s))  Lithium level     Status: None   Collection Time: 11/13/20  1:03 PM  Result Value Ref Range   Lithium Lvl 1.05 0.60 - 1.20 mmol/L    Comment: Performed at [facility], [address] [place], [place] [address]    Blood Alcohol level:  Lab Results  Component Value Date   ETH <10 11/06/2020   ETH <10 06/30/2020    Metabolic Disorder Labs: Lab Results  Component Value Date   HGBA1C 4.9 11/09/2020   MPG 93.93 11/09/2020   MPG 100 09/03/2014   Lab Results  Component Value Date   PROLACTIN 34.1 (H) 03/11/2020   Lab Results  Component Value Date   CHOL 141 11/09/2020   TRIG 59 11/09/2020   HDL 45 11/09/2020   CHOLHDL 3.1 11/09/2020   VLDL 12 11/09/2020   LDLCALC 84 11/09/2020   LDLCALC 88 04/21/2020    Physical Findings: AIMS: Facial and Oral Movements Muscles of Facial Expression: None, normal Lips and Perioral Area: None, normal Jaw: None, normal Tongue: None, normal,Extremity Movements Upper (arms, wrists, hands, fingers): None, normal Lower (legs, knees, ankles, toes): None, normal, Trunk Movements Neck, shoulders, hips: None, normal, Overall Severity Severity of abnormal movements (highest score from questions above): None, normal Incapacitation due to abnormal movements: None, normal Patient's awareness of abnormal movements (rate only patient's report): No Awareness, Dental Status Current problems with teeth and/or dentures?: No Does patient usually wear dentures?: No  CIWA:    COWS:     Musculoskeletal: Strength & Muscle Tone: within normal limits Gait & Station: normal Patient leans: N/A  Psychiatric Specialty Exam: Physical Exam Vitals and nursing note reviewed.  Constitutional:      Appearance: Normal appearance.  HENT:     Head:  Normocephalic and atraumatic.     Right Ear: External ear normal.     Left Ear: External ear normal.     Nose: Nose normal.     Mouth/Throat:     Mouth: Mucous membranes are moist.     Pharynx: Oropharynx is clear.  Eyes:     Extraocular Movements: Extraocular movements intact.     Conjunctiva/sclera: Conjunctivae normal.     Pupils: Pupils are equal, round, and reactive to light.  Cardiovascular:     Rate and Rhythm: Normal rate.     Pulses: Normal pulses.  Pulmonary:     Effort: Pulmonary effort is normal.     Breath sounds: Normal breath sounds.  Abdominal:     General: Abdomen is flat.     Palpations: Abdomen is soft.  Musculoskeletal:        General: No swelling. Normal range of motion.     Cervical back: Normal range of motion and neck supple.  Skin:    General: Skin is warm and dry.  Neurological:     General: No focal deficit present.     Mental Status: She is alert and oriented to person, place, and time.  Psychiatric:        Attention and Perception: She is inattentive.        Mood and Affect: Mood is anxious. Affect is flat.        Speech: Speech is delayed.        Behavior: Behavior is slowed and withdrawn.        Thought Content: Thought content is paranoid and delusional.        Cognition and Memory: Cognition and memory normal.        Judgment: Judgment is inappropriate.     Review of Systems  Constitutional: Negative for appetite change and fatigue.  HENT: Negative for rhinorrhea and sore throat.   Eyes: Negative for photophobia and visual disturbance.  Respiratory: Negative for cough and shortness of breath.   Cardiovascular: Negative for chest pain and palpitations.  Gastrointestinal: Positive for constipation. Negative for diarrhea, nausea and vomiting.  Endocrine: Negative for cold intolerance and heat intolerance.  Genitourinary: Negative for difficulty urinating and dysuria.  Musculoskeletal: Negative for arthralgias and myalgias.  Skin: Negative  for rash and wound.  Allergic/Immunologic: Negative for environmental allergies.  Neurological: Negative for dizziness and headaches.  Hematological: Negative for adenopathy. Does not bruise/bleed easily.  Psychiatric/Behavioral: Positive for agitation, behavioral problems and decreased concentration. Negative for hallucinations and suicidal ideas.    Blood pressure 133/88, pulse 97, temperature 98.7 F (37.1 C), temperature source Oral, resp. rate 17, height  (1.626 m), weight 75.8 kg, SpO2 99 %.Body mass index is 28.67 kg/m.  General Appearance: Guarded  Eye Contact:  Fair  Speech:  Pressured  Volume:  Decreased  Mood:  Anxious and Dysphoric  Affect:  Constricted  Thought Process:  Disorganized  Orientation:  Full (Time, Place, and Person)  Thought Content:  Illogical, Delusions and Paranoid Ideation  Suicidal Thoughts:  No  Homicidal Thoughts:  No  Memory:  Immediate;   Fair Recent;   Fair Remote;   NA  Judgement:  Impaired  Insight:  Lacking  Psychomotor Activity:  Restlessness  Concentration:  Concentration: Poor and Attention Span: Poor  Recall:  NA  Fund of Knowledge:  Fair  Language:  Fair  Akathisia:  No  Handed:  Right  AIMS (if indicated):     Assets:  Desire for Improvement Physical Health  ADL's:  Intact  Cognition:  Impaired,  Mild  Sleep:  Number of Hours: 1     Treatment Plan Summary: Daily contact with patient to assess and evaluate symptoms and progress in treatment and Medication management   Patient is a 29 year old female with the above-stated past psychiatric history who is seen in follow-up.  Chart reviewed. Patient discussed with nursing. Patient continues to express paranoid persecutory delusions; no aggressive behavior in the unit. Compliance with medications encouraged. Continue morning dose of antipsychotic (Geodon  QAM), decease  nighttime Geodon  HS, add Zyprexa 10 mg at bedtime, continue Lithium  HS (lithium level 0.8).  Patient has previously failed monotherapy with Seroquel, Geodon, Abilify, Zyprexa, Saphris, Fanapt, Invega, and Risperdal.  Plan: -continue inpatient psych admission; 15-minute checks; daily contact with patient to assess and evaluate symptoms and progress in treatment; psychoeducation.  -continue scheduled medications: increase nighttime Geodon . benztropine  1 mg Oral QHS  . docusate sodium  100 mg Oral BID  . feeding supplement  237 mL Oral BID BM  . lithium carbonate  900 mg Oral QHS  . multivitamin with minerals  1 tablet Oral Daily  . OLANZapine  10 mg Oral QHS  . ziprasidone  20 mg Oral QPC breakfast  . ziprasidone  40 mg Oral QHS   -continue PRN medications.  acetaminophen, magnesium hydroxide, OLANZapine zydis, simethicone, ziprasidone  -Pertinent Labs: lipid panel reviewed - WNL. Li level 12/08 - 0.68mmol/L.  -EKG: on 12/9 showed Qtc 417.    -Consults: No new consults placed since yesterday    -Disposition: patient is not ready for d/c yet. All necessary aftercare will be arranged prior to discharge Likely d/c home with outpatient psych follow-up.  -  I certify that the patient does need, on a daily basis, active treatment furnished directly by or requiring the supervision of inpatient psychiatric facility personnel.   Jesse Sans, MD 11/13/2020, 2:15 PM

## 2020-11-14 NOTE — BHH Counselor (Signed)
CSW checked in with patient who reports that she would like to follow up with [facility] for aftercare services.   Pt later reported feeling "watched" and "judged" by other patient when getting water.  She reports that the "was given the all clear to drink as much water as I need since I am taking the Lithium".  CSW encouraged pt to continue drinking water and assured her that no one is judging her for her water intake.  CSW encouraged patient to continue drinking water and let staff now if there is an issue.  Penni Homans, MSW, LCSW 11/14/2020 3:57 PM

## 2020-11-14 NOTE — Progress Notes (Signed)
[facility] MD Progress Note  11/14/2020 10:35 AM Natalie [name]  MRN:  [id number]   Principal Problem: Bipolar affective disorder, current episode manic with psychotic symptoms (HCC) Diagnosis: Principal Problem:   Bipolar affective disorder, current episode manic with psychotic symptoms (HCC) Active Problems:   OCD (obsessive compulsive disorder)  Natalie [name] is a 29 y.o. female patient who presents to the [facility] unit for treatment of worsened paranoia and depression in the context of psychotropic medication non-adherence.   Interval History Patient was seen today for re-evaluation.  Nursing reports that patient was very paranoid yesterday after moving her room per request. She was stating she "did not meet requirements" for the room citing her lack of multiple changes of clothes and make-up. She was anxious overnight, but was medication compliant with encouragement. This morning, patient also remains paranoid about food and colace.   Subjective:  Patient seen one-on-one in office. On assessment patient reports "I am fine. I need to go home." She also reports that she is eating and drinking well. When asked about reports of only eating two bites of eggs and part of a sausage, patient then reveals that she has not been eating or drinking much. She feels that she is not allowed to do so, and staff are keeping special guard over the water. She feels that she is the only patient who is not allowed to use the water fountain. Explained to her that any patients, including her, are free to get water at any time from the day room. Also reiterated that this was free of charge, and she did not need to pay for water. Encouraged her to continue taking her medications. She again reports that her feet are swelling, however, on exam there is no swelling or erythema. She continues to request discharge. Explained that she is currently not progressing, and it is too soon to give her a target discharge date. Encouraged  medication compliance and group attendance. Also encouraged her to drinking plenty of water today and to eat her meals without prompting.   Labs: No new labs to review  Total Time spent with patient: 45 min  Past Psychiatric History: Multiple inpatient hospitilizations. Most recently in August 2021 at [facility] in [place] where she was hospitalized for 2 weeks. She sees Dr. Gilmore Laroche for outpatient management. She was most recently stabilized on Geodon 40 mg QHS, Lithium CR 900 mg QHS, and cogentin 1 mg QHS. Past medication trials including Seroquel, Abilify, Zyprexa, Saphris, Fanapt, Invega, Risperdal, Tegretol. Lamictal, and Trazodone. History of suicide attempt via overdose in 2014.   Past Medical History:  Past Medical History:  Diagnosis Date  . Anxiety   . Chronic bipolar disorder (HCC)   . Depression   . H/O suicide attempt 05-2013   OD  . Obesity   . OCD (obsessive compulsive disorder)   . Psychiatric diagnosis    History reviewed. No pertinent surgical history. Family History:  Family History  Problem Relation Age of Onset  . Hypertension Mother   . Depression Father   . Hypertension Father   . Bipolar disorder Father   . Hypertension Maternal Grandmother   . Obesity Maternal Grandmother   . Hypertension Paternal Grandmother    Family Psychiatric  History: Father with bipolar disorder  Social History:  Social History   Substance and Sexual Activity  Alcohol Use No     Social History   Substance and Sexual Activity  Drug Use No    Social History   Socioeconomic History  .  Marital status: Married    Spouse name: [name]  . Number of children: 0  . Years of education: Not on file  . Highest education level: Not on file  Occupational History  . Occupation: unemployed  Tobacco Use  . Smoking status: Never Smoker  . Smokeless tobacco: Never Used  Vaping Use  . Vaping Use: Never used  Substance and Sexual Activity  . Alcohol use: No  . Drug use: No   . Sexual activity: Yes    Birth control/protection: I.U.D.  Other Topics Concern  . Not on file  Social History Narrative   Lives with husband   Right handed   Drinks caffeine seldomly   Social Determinants of Health   Financial Resource Strain: Not on file  Food Insecurity: Not on file  Transportation Needs: Not on file  Physical Activity: Not on file  Stress: Not on file  Social Connections: Not on file   Additional Social History:                         Sleep: Poor  Appetite:  Fair  Current Medications: Current Facility-Administered Medications  Medication Dose Route Frequency Provider Last Rate Last Admin  . acetaminophen (TYLENOL) tablet 650 mg  650 mg Oral Q6H PRN Clapacs, John T, MD      . benztropine (COGENTIN) tablet 1 mg  1 mg Oral QHS Jesse Sans, MD   1 mg at 11/13/20 2102  . docusate sodium (COLACE) capsule 100 mg  100 mg Oral BID Clapacs, Jackquline Denmark, MD   100 mg at 11/14/20 0809  . feeding supplement (ENSURE ENLIVE / ENSURE PLUS) liquid 237 mL  237 mL Oral BID BM Jesse Sans, MD   237 mL at 11/13/20 1419  . lithium carbonate (ESKALITH) CR tablet 900 mg  900 mg Oral QHS Clapacs, Jackquline Denmark, MD   900 mg at 11/13/20 2102  . magnesium hydroxide (MILK OF MAGNESIA) suspension 30 mL  30 mL Oral Daily PRN Clapacs, John T, MD   30 mL at 11/10/20 1000  . multivitamin with minerals tablet 1 tablet  1 tablet Oral Daily Jesse Sans, MD   1 tablet at 11/14/20 0809  . OLANZapine (ZYPREXA) tablet 10 mg  10 mg Oral QHS Jesse Sans, MD   10 mg at 11/13/20 2102  . OLANZapine zydis (ZYPREXA) disintegrating tablet 10 mg  10 mg Oral BID PRN Clapacs, Jackquline Denmark, MD   10 mg at 11/10/20 1811  . simethicone (MYLICON) chewable tablet 80 mg  80 mg Oral QID PRN Jesse Sans, MD      . ziprasidone (GEODON) capsule 20 mg  20 mg Oral QPC breakfast Thalia Party, MD   20 mg at 11/14/20 0807  . ziprasidone (GEODON) capsule 40 mg  40 mg Oral QHS Jesse Sans, MD   40 mg  at 11/13/20 2102  . ziprasidone (GEODON) injection 20 mg  20 mg Intramuscular Daily PRN Clapacs, Jackquline Denmark, MD        Lab Results:  Results for orders placed or performed during the hospital encounter of 11/08/20 (from the past 48 hour(s))  Lithium level     Status: None   Collection Time: 11/13/20  1:03 PM  Result Value Ref Range   Lithium Lvl 1.05 0.60 - 1.20 mmol/L    Comment: Performed at [facility], [address] [place], [place] [address]    Blood Alcohol level:  Lab  Results  Component Value Date   ETH <10 11/06/2020   ETH <10 06/30/2020    Metabolic Disorder Labs: Lab Results  Component Value Date   HGBA1C 4.9 11/09/2020   MPG 93.93 11/09/2020   MPG 100 09/03/2014   Lab Results  Component Value Date   PROLACTIN 34.1 (H) 03/11/2020   Lab Results  Component Value Date   CHOL 141 11/09/2020   TRIG 59 11/09/2020   HDL 45 11/09/2020   CHOLHDL 3.1 11/09/2020   VLDL 12 11/09/2020   LDLCALC 84 11/09/2020   LDLCALC 88 04/21/2020    Physical Findings: AIMS: Facial and Oral Movements Muscles of Facial Expression: None, normal Lips and Perioral Area: None, normal Jaw: None, normal Tongue: None, normal,Extremity Movements Upper (arms, wrists, hands, fingers): None, normal Lower (legs, knees, ankles, toes): None, normal, Trunk Movements Neck, shoulders, hips: None, normal, Overall Severity Severity of abnormal movements (highest score from questions above): None, normal Incapacitation due to abnormal movements: None, normal Patient's awareness of abnormal movements (rate only patient's report): No Awareness, Dental Status Current problems with teeth and/or dentures?: No Does patient usually wear dentures?: No  CIWA:    COWS:     Musculoskeletal: Strength & Muscle Tone: within normal limits Gait & Station: normal Patient leans: N/A  Psychiatric Specialty Exam: Physical Exam Vitals and nursing note reviewed.  Constitutional:      Appearance:  Normal appearance.  HENT:     Head: Normocephalic and atraumatic.     Right Ear: External ear normal.     Left Ear: External ear normal.     Nose: Nose normal.     Mouth/Throat:     Mouth: Mucous membranes are moist.     Pharynx: Oropharynx is clear.  Eyes:     Extraocular Movements: Extraocular movements intact.     Conjunctiva/sclera: Conjunctivae normal.     Pupils: Pupils are equal, round, and reactive to light.  Cardiovascular:     Rate and Rhythm: Normal rate.     Pulses: Normal pulses.  Pulmonary:     Effort: Pulmonary effort is normal.     Breath sounds: Normal breath sounds.  Abdominal:     General: Abdomen is flat.     Palpations: Abdomen is soft.  Musculoskeletal:        General: No swelling. Normal range of motion.     Cervical back: Normal range of motion and neck supple.  Skin:    General: Skin is warm and dry.  Neurological:     General: No focal deficit present.     Mental Status: She is alert and oriented to person, place, and time.  Psychiatric:        Attention and Perception: She is inattentive.        Mood and Affect: Mood is anxious. Affect is flat.        Speech: Speech is delayed.        Behavior: Behavior is slowed and withdrawn.        Thought Content: Thought content is paranoid and delusional.        Cognition and Memory: Cognition and memory normal.        Judgment: Judgment is inappropriate.     Review of Systems  Constitutional: Negative for appetite change and fatigue.  HENT: Negative for rhinorrhea and sore throat.   Eyes: Negative for photophobia and visual disturbance.  Respiratory: Negative for cough and shortness of breath.   Cardiovascular: Negative for chest pain and palpitations.  Gastrointestinal:  Positive for constipation. Negative for diarrhea, nausea and vomiting.  Endocrine: Negative for cold intolerance and heat intolerance.  Genitourinary: Negative for difficulty urinating and dysuria.  Musculoskeletal: Negative for  arthralgias and myalgias.  Skin: Negative for rash and wound.  Allergic/Immunologic: Negative for environmental allergies.  Neurological: Negative for dizziness and headaches.  Hematological: Negative for adenopathy. Does not bruise/bleed easily.  Psychiatric/Behavioral: Positive for agitation, behavioral problems and decreased concentration. Negative for hallucinations and suicidal ideas.    Blood pressure 125/83, pulse 99, temperature 98.7 F (37.1 C), temperature source Oral, resp. rate 17, height  (1.626 m), weight 75.8 kg, SpO2 100 %.Body mass index is 28.67 kg/m.  General Appearance: Guarded  Eye Contact:  Fair  Speech:  Normal Rate  Volume:  Decreased  Mood:  Anxious and Dysphoric  Affect:  Constricted  Thought Process:  Disorganized  Orientation:  Full (Time, Place, and Person)  Thought Content:  Illogical, Delusions and Paranoid Ideation  Suicidal Thoughts:  No  Homicidal Thoughts:  No  Memory:  Immediate;   Fair Recent;   Fair Remote;   NA  Judgement:  Impaired  Insight:  Lacking  Psychomotor Activity:  Restlessness  Concentration:  Concentration: Poor and Attention Span: Poor  Recall:  NA  Fund of Knowledge:  Fair  Language:  Fair  Akathisia:  No  Handed:  Right  AIMS (if indicated):     Assets:  Desire for Improvement Physical Health  ADL's:  Intact  Cognition:  Impaired,  Mild  Sleep:  Number of Hours: 5.5     Treatment Plan Summary: Daily contact with patient to assess and evaluate symptoms and progress in treatment and Medication management   Patient is a 29 year old female with the above-stated past psychiatric history who is seen in follow-up.  Chart reviewed. Patient discussed with nursing. Patient continues to express paranoid persecutory delusions; no aggressive behavior in the unit. Compliance with medications encouraged. Continue morning dose of antipsychotic (Geodon  QAM), continue nighttime Geodon  HS, continue Zyprexa 10 mg at  bedtime, continue Lithium  HS (lithium level 0.8). Patient has previously failed monotherapy with Seroquel, Geodon, Abilify, Zyprexa, Saphris, Fanapt, Invega, and Risperdal.  Plan: -continue inpatient psych admission; 15-minute checks; daily contact with patient to assess and evaluate symptoms and progress in treatment; psychoeducation.  -continue scheduled medications: increase nighttime Geodon . benztropine  1 mg Oral QHS  . docusate sodium  100 mg Oral BID  . feeding supplement  237 mL Oral BID BM  . lithium carbonate  900 mg Oral QHS  . multivitamin with minerals  1 tablet Oral Daily  . OLANZapine  10 mg Oral QHS  . ziprasidone  20 mg Oral QPC breakfast  . ziprasidone  40 mg Oral QHS   -continue PRN medications.  acetaminophen, magnesium hydroxide, OLANZapine zydis, simethicone, ziprasidone  -Pertinent Labs: lipid panel reviewed - WNL. Li level 12/08 - 0.26mmol/L.  -EKG: on 12/9 showed Qtc 417.    -Consults: No new consults placed since yesterday    -Disposition: patient is not ready for d/c yet. All necessary aftercare will be arranged prior to discharge Likely d/c home with outpatient psych follow-up.  -  I certify that the patient does need, on a daily basis, active treatment furnished directly by or requiring the supervision of inpatient psychiatric facility personnel.   Jesse Sans, MD 11/14/2020, 10:35 AM

## 2020-11-14 NOTE — Progress Notes (Signed)
Recreation Therapy Notes   Date: 11/14/2020  Time: 9:30 am   Location: Craft room  Behavioral response: Appropriate  Intervention Topic: Emotions   Discussion/Intervention:  Group content on today was focused on emotions. The group identified what emotions are and why it is important to have emotions. Patients expressed some positive and negative emotions. Individuals gave some past experiences on how they normally dealt with emotions in the past. The group described some positive ways to deal with emotions in the future. Patients participated in the intervention "Name the Megan Salon" where individuals were given a chance to experience different emotions.  Clinical Observations/Feedback: Patient came to group and was focused on what peers and staff had to say about emotions. Individual was social with peers and staff participating in the intervention.  Rachel Samples LRT/CTRS         Boris Engelmann 11/14/2020 12:03 PM

## 2020-11-14 NOTE — BHH Counselor (Signed)
CSW met with patient at patient's request.  Patient stated that "I acted out last night with the nurses and now everyone is giving me side-eye and looking at me differently".   CSW pointed out that to her knowledge there have been nor reports of any outbursts from patient and no one is thinking negatively of the patient.  Patient begin stating concern that her urine had a strong odor and that ws concerning her.  She reports that she has mentioned this to nursing staff who report that this is possibly caused by patient poor eating and drinking habits.  She reports that she was encouraged to drink more fluids but she does not believe this to be the cause.  Patient later stated that she feels her hygiene is affected and that it is further affected by menstrual cycle.  CSW was able to confirm that patient had necessary feminine products.  CSW explained that this is outside of CSWs purview and explained that she will inform patient's nurse of the concerns.  Natalie [name], MSW, LCSW 11/14/2020 12:03 PM

## 2020-11-14 NOTE — Plan of Care (Signed)
Patient presents with paranoia this evening   Problem: Education: Goal: Emotional status will improve Outcome: Not Progressing Goal: Mental status will improve Outcome: Not Progressing

## 2020-11-14 NOTE — BHH Counselor (Signed)
CSW met with patient at their request. Patient reported that she was concerned that her personal belongings were missing. CSW assured that her belongings were inventoried and locked during her stay and that she would have the opportunity to go over her belongings with a nurse prior to discharge. Patient asked if she could wash her clothes; CSW encouraged patient to share her request with MHT. Patient also expressed concern for medication side effects; CSW encouraged patient to share her concerns with MD related to medications. During conversation patient was alert x4, spoke with slowed pace and low volume; patient affect was depressed, no evidence of imminent harm.    Durenda Hurt, MSW, [place], LCASA 11/14/2020 12:14 PM

## 2020-11-14 NOTE — BHH Counselor (Signed)
LCSW Group Therapy Note  11/14/2020 2:30 PM  Type of Therapy/Topic:  Group Therapy:  Balance in Life  Participation Level:  Active  Description of Group:    This group will address the concept of balance and how it feels and looks when one is unbalanced. Patients will be encouraged to process areas in their lives that are out of balance and identify reasons for remaining unbalanced. Facilitators will guide patients in utilizing problem-solving interventions to address and correct the stressor making their life unbalanced. Understanding and applying boundaries will be explored and addressed for obtaining and maintaining a balanced life. Patients will be encouraged to explore ways to assertively make their unbalanced needs known to significant others in their lives, using other group members and facilitator for support and feedback.  Therapeutic Goals: 1. Patient will identify two or more emotions or situations they have that consume much of in their lives. 2. Patient will identify signs/triggers that life has become out of balance:  3. Patient will identify two ways to set boundaries in order to achieve balance in their lives:  4. Patient will demonstrate ability to communicate their needs through discussion and/or role plays  Summary of Patient Progress: Patient participated in the icebreaker and in the coloring/mindfulness activity.  Therapeutic Modalities:   Cognitive Behavioral Therapy Solution-Focused Therapy Assertiveness Training  [employer]. Algis Greenhouse, MSW, LCSW, LCAS 11/14/2020 2:30 PM

## 2020-11-14 NOTE — Progress Notes (Signed)
Pt is alert and oriented to person, place, time and situation. Denies suicidal and homicidal ideation, denies hallucinations, denies feelings of depression and anxiety. Pt is very paranoid and focused on discharge, has been refusing food, will only eat a small amount with a great deal of encouragement. Pt makes poor eye contact, hangs head low, is withdrawn, does not interact with peers. Will continue to monitor pt per Q15 minute face checks and monitor for safety and progress.

## 2020-11-14 NOTE — Progress Notes (Signed)
Patient presents with a better affect than this writer has seen during this admission. Education, support, and encouragement given to be active in her treatment plan. Patient denies SI/HI/AVH. Patient endorses anxiety stating it's from being in here. Patient being monitored Q 15 minutes for safety per unit protocol. Pt remains safe on the unit. Patient is preoccupied with leaving, patient given education.  

## 2020-11-15 ENCOUNTER — Ambulatory Visit: Payer: Medicaid Other | Admitting: Family Medicine

## 2020-11-15 MED ORDER — OLANZAPINE 5 MG PO TABS
15.0000 mg | ORAL_TABLET | Freq: Every day | ORAL | Status: DC
  Administered 2020-11-15: 21:00:00 15 mg via ORAL
  Filled 2020-11-15: qty 3

## 2020-11-15 MED ORDER — ZIPRASIDONE HCL 20 MG PO CAPS
20.0000 mg | ORAL_CAPSULE | Freq: Every day | ORAL | Status: DC
  Administered 2020-11-15 – 2020-11-17 (×3): 20 mg via ORAL
  Filled 2020-11-15 (×3): qty 1

## 2020-11-15 NOTE — Progress Notes (Signed)
Patient just came to the nurse's station stating "can I show you what I ate". This writer went down to the community room and it appeared that patient ate some of her fresh fruit, a few bites of macaroni and some broccoli. Patient didn't touch her crispy chicken.

## 2020-11-15 NOTE — BHH Group Notes (Signed)
BHH Group Notes:  (Nursing/MHT/Case Management/Adjunct)  Date:  11/15/2020  Time:  9:39 PM  Type of Therapy:  Wrap up Group  Participation Level:  Active  Participation Quality:  Appropriate  Affect:  Appropriate  Cognitive:  Alert  Insight:  Good  Engagement in Group:  Engaged and said she had a horrible day and needed someone to talk to. She didn't eat no snack.  Modes of Intervention:  Support  Summary of Progress/Problems:  Natalie [name] 11/15/2020, 9:39 PM

## 2020-11-15 NOTE — Progress Notes (Signed)
BRIEF PHARMACY NOTE   This patient attended and participated in Medication Management Group counseling led by [facility] staff pharmacist.  This interactive class reviews basic information about prescription medications and education on personal responsibility in medication management.  The class also includes general knowledge of 3 main classes of behavioral medications, including antipsychotics, antidepressants, and mood stabilizers.     Patient behavior was appropriate for group setting.   Educational materials sourced from:  "Medication Do's and Don'ts" from [email or web address]   "Mental Health Medications" from [facility] of Mental Health FaxRack.tn.shtml#part [email or web address]    Albina Billet, PharmD, BCPS Clinical Pharmacist 11/15/2020 7:34 AM

## 2020-11-15 NOTE — B[facility] Group Notes (Signed)
.  LCSW Group Therapy Note  11/15/2020 2:52 PM  Type of Therapy and Topic:  Group Therapy: Health relationships Participation Level:  Minimal   Description of Group:   In this group, patients will discuss the difference between healthy and unhealthy relationships. Discuss unhealthy relationships and how to have positive healthy boundaries with those that sabotage and enable. Explore aspects of healthy relationships and how to limit these self-destructive behaviors in everyday life.   Therapeutic Goals: 1. Patient will identify one obstacle that relates to self-sabotage and enabling behaviors 2. Patient will identify one personal self-sabotaging or enabling behavior they did prior to admission 3. Patient will state a plan to change the above identified behavior 4. Patient will demonstrate ability to communicate their needs through discussion and/or role play.   Summary of Patient Progress: Patient was present for the entirety of the group session. Patient participated in group introduction, otherwise, did not participate in the topic of discussion.   Therapeutic Modalities:   Cognitive Behavioral Therapy Person-Centered Therapy Motivational Interviewing   Gwenevere Ghazi, MSW, [place], [place] 11/15/2020 2:52 PM

## 2020-11-15 NOTE — Progress Notes (Signed)
[facility] MD Progress Note  11/15/2020 12:11 PM Natalie [name]  MRN:  [id number]   Principal Problem: Bipolar affective disorder, current episode manic with psychotic symptoms (HCC) Diagnosis: Principal Problem:   Bipolar affective disorder, current episode manic with psychotic symptoms (HCC) Active Problems:   OCD (obsessive compulsive disorder)  Natalie [name] is a 29 y.o. female patient who presents to the [facility] unit for treatment of worsened paranoia and depression in the context of psychotropic medication non-adherence.   Interval History Patient was seen today for re-evaluation.  Nursing reports that patient was very paranoid yesterday and overnight. She was medication compliant with encouragement.   Subjective:  Patient seen one-on-one in office. On assessment patient reports " I need to go home." She states that staff is angry with her and working against her to keep her in the hospital. She notes that she does not want to eat our food, but will eat at home. She remains convinced that we are charging her extra for each piece of food she eats as a way to gain more income. Reassured her that hospital bill is per day and includes all of her food, and she is not being charged extra. She then begins to explain that people are judging her for the amount of water she is drinking, and also being charged for this. Again explained that water is free to all patients, and she can continue drinking water. She continues to request discharge. Explained that she is currently not progressing, and it is too soon to give her a target discharge date. Encouraged medication compliance and group attendance. Also encouraged her to drinking plenty of water today and to eat her meals without prompting. Informed we will increase Zyprexa tonight, and decrease Geodon.   Labs: No new labs to review  Total Time spent with patient: 45 min  Past Psychiatric History: Multiple inpatient hospitilizations. Most recently in August  2021 at [facility] in [place] where she was hospitalized for 2 weeks. She sees Dr. Gilmore Laroche for outpatient management. She was most recently stabilized on Geodon 40 mg QHS, Lithium CR 900 mg QHS, and cogentin 1 mg QHS. Past medication trials including Seroquel, Abilify, Zyprexa, Saphris, Fanapt, Invega, Risperdal, Tegretol. Lamictal, and Trazodone. History of suicide attempt via overdose in 2014.   Past Medical History:  Past Medical History:  Diagnosis Date  . Anxiety   . Chronic bipolar disorder (HCC)   . Depression   . H/O suicide attempt 05-2013   OD  . Obesity   . OCD (obsessive compulsive disorder)   . Psychiatric diagnosis    History reviewed. No pertinent surgical history. Family History:  Family History  Problem Relation Age of Onset  . Hypertension Mother   . Depression Father   . Hypertension Father   . Bipolar disorder Father   . Hypertension Maternal Grandmother   . Obesity Maternal Grandmother   . Hypertension Paternal Grandmother    Family Psychiatric  History: Father with bipolar disorder  Social History:  Social History   Substance and Sexual Activity  Alcohol Use No     Social History   Substance and Sexual Activity  Drug Use No    Social History   Socioeconomic History  . Marital status: Married    Spouse name: [name]  . Number of children: 0  . Years of education: Not on file  . Highest education level: Not on file  Occupational History  . Occupation: unemployed  Tobacco Use  . Smoking status: Never Smoker  .  Smokeless tobacco: Never Used  Vaping Use  . Vaping Use: Never used  Substance and Sexual Activity  . Alcohol use: No  . Drug use: No  . Sexual activity: Yes    Birth control/protection: I.U.D.  Other Topics Concern  . Not on file  Social History Narrative   Lives with husband   Right handed   Drinks caffeine seldomly   Social Determinants of Health   Financial Resource Strain: Not on file  Food Insecurity: Not on  file  Transportation Needs: Not on file  Physical Activity: Not on file  Stress: Not on file  Social Connections: Not on file   Additional Social History:                         Sleep: Poor  Appetite:  Fair  Current Medications: Current Facility-Administered Medications  Medication Dose Route Frequency Provider Last Rate Last Admin  . acetaminophen (TYLENOL) tablet 650 mg  650 mg Oral Q6H PRN Clapacs, John T, MD      . benztropine (COGENTIN) tablet 1 mg  1 mg Oral QHS Jesse Sans, MD   1 mg at 11/14/20 2039  . docusate sodium (COLACE) capsule 100 mg  100 mg Oral BID Clapacs, Jackquline Denmark, MD   100 mg at 11/15/20 0752  . feeding supplement (ENSURE ENLIVE / ENSURE PLUS) liquid 237 mL  237 mL Oral BID BM Jesse Sans, MD   237 mL at 11/13/20 1419  . lithium carbonate (ESKALITH) CR tablet 900 mg  900 mg Oral QHS Clapacs, Jackquline Denmark, MD   900 mg at 11/14/20 2040  . magnesium hydroxide (MILK OF MAGNESIA) suspension 30 mL  30 mL Oral Daily PRN Clapacs, Jackquline Denmark, MD   30 mL at 11/15/20 0752  . multivitamin with minerals tablet 1 tablet  1 tablet Oral Daily Jesse Sans, MD   1 tablet at 11/15/20 [phone]  . OLANZapine (ZYPREXA) tablet 15 mg  15 mg Oral QHS Jesse Sans, MD      . OLANZapine zydis [facility]) disintegrating tablet 10 mg  10 mg Oral BID PRN Clapacs, Jackquline Denmark, MD   10 mg at 11/10/20 1811  . simethicone (MYLICON) chewable tablet 80 mg  80 mg Oral QID PRN Jesse Sans, MD      . ziprasidone (GEODON) capsule 20 mg  20 mg Oral QPC breakfast Thalia Party, MD   20 mg at 11/15/20 1101  . ziprasidone (GEODON) capsule 20 mg  20 mg Oral QHS Jesse Sans, MD      . ziprasidone (GEODON) injection 20 mg  20 mg Intramuscular Daily PRN Clapacs, Jackquline Denmark, MD        Lab Results:  Results for orders placed or performed during the hospital encounter of 11/08/20 (from the past 48 hour(s))  Lithium level     Status: None   Collection Time: 11/13/20  1:03 PM  Result Value Ref Range    Lithium Lvl 1.05 0.60 - 1.20 mmol/L    Comment: Performed at [facility], [address] [place], [place] [address]    Blood Alcohol level:  Lab Results  Component Value Date   [facility] <10 11/06/2020   ETH <10 06/30/2020    Metabolic Disorder Labs: Lab Results  Component Value Date   HGBA1C 4.9 11/09/2020   MPG 93.93 11/09/2020   MPG 100 09/03/2014   Lab Results  Component Value Date   PROLACTIN 34.1 (H)  03/11/2020   Lab Results  Component Value Date   CHOL 141 11/09/2020   TRIG 59 11/09/2020   HDL 45 11/09/2020   CHOLHDL 3.1 11/09/2020   VLDL 12 11/09/2020   LDLCALC 84 11/09/2020   LDLCALC 88 04/21/2020    Physical Findings: AIMS: Facial and Oral Movements Muscles of Facial Expression: None, normal Lips and Perioral Area: None, normal Jaw: None, normal Tongue: None, normal,Extremity Movements Upper (arms, wrists, hands, fingers): None, normal Lower (legs, knees, ankles, toes): None, normal, Trunk Movements Neck, shoulders, hips: None, normal, Overall Severity Severity of abnormal movements (highest score from questions above): None, normal Incapacitation due to abnormal movements: None, normal Patient's awareness of abnormal movements (rate only patient's report): No Awareness, Dental Status Current problems with teeth and/or dentures?: No Does patient usually wear dentures?: No  CIWA:    COWS:     Musculoskeletal: Strength & Muscle Tone: within normal limits Gait & Station: normal Patient leans: N/A  Psychiatric Specialty Exam: Physical Exam Vitals and nursing note reviewed.  Constitutional:      Appearance: Normal appearance.  HENT:     Head: Normocephalic and atraumatic.     Right Ear: External ear normal.     Left Ear: External ear normal.     Nose: Nose normal.     Mouth/Throat:     Mouth: Mucous membranes are moist.     Pharynx: Oropharynx is clear.  Eyes:     Extraocular Movements: Extraocular movements intact.      Conjunctiva/sclera: Conjunctivae normal.     Pupils: Pupils are equal, round, and reactive to light.  Cardiovascular:     Rate and Rhythm: Normal rate.     Pulses: Normal pulses.  Pulmonary:     Effort: Pulmonary effort is normal.     Breath sounds: Normal breath sounds.  Abdominal:     General: Abdomen is flat.     Palpations: Abdomen is soft.  Musculoskeletal:        General: No swelling. Normal range of motion.     Cervical back: Normal range of motion and neck supple.  Skin:    General: Skin is warm and dry.  Neurological:     General: No focal deficit present.     Mental Status: She is alert and oriented to person, place, and time.  Psychiatric:        Attention and Perception: She is inattentive.        Mood and Affect: Mood is anxious. Affect is flat.        Speech: Speech is delayed.        Behavior: Behavior is slowed and withdrawn.        Thought Content: Thought content is paranoid and delusional.        Cognition and Memory: Cognition and memory normal.        Judgment: Judgment is inappropriate.     Review of Systems  Constitutional: Negative for appetite change and fatigue.  HENT: Negative for rhinorrhea and sore throat.   Eyes: Negative for photophobia and visual disturbance.  Respiratory: Negative for cough and shortness of breath.   Cardiovascular: Negative for chest pain and palpitations.  Gastrointestinal: Positive for constipation. Negative for diarrhea, nausea and vomiting.  Endocrine: Negative for cold intolerance and heat intolerance.  Genitourinary: Negative for difficulty urinating and dysuria.  Musculoskeletal: Negative for arthralgias and myalgias.  Skin: Negative for rash and wound.  Allergic/Immunologic: Negative for environmental allergies.  Neurological: Negative for dizziness and headaches.  Hematological:  Negative for adenopathy. Does not bruise/bleed easily.  Psychiatric/Behavioral: Positive for agitation, behavioral problems and decreased  concentration. Negative for hallucinations and suicidal ideas.    Blood pressure 123/80, pulse (!) 117, temperature 98.9 F (37.2 C), temperature source Oral, resp. rate 17, height 5\' 4"  (1.626 m), weight 75.8 kg, SpO2 95 %.Body mass index is 28.67 kg/m.  General Appearance: Guarded  Eye Contact:  Fair  Speech:  Normal Rate  Volume:  Decreased  Mood:  Anxious and Dysphoric  Affect:  Constricted  Thought Process:  Disorganized  Orientation:  Full (Time, Place, and Person)  Thought Content:  Illogical, Delusions and Paranoid Ideation  Suicidal Thoughts:  No  Homicidal Thoughts:  No  Memory:  Immediate;   Fair Recent;   Fair Remote;   NA  Judgement:  Impaired  Insight:  Lacking  Psychomotor Activity:  Restlessness  Concentration:  Concentration: Poor and Attention Span: Poor  Recall:  NA  Fund of Knowledge:  Fair  Language:  Fair  Akathisia:  No  Handed:  Right  AIMS (if indicated):     Assets:  Desire for Improvement Physical Health  ADL's:  Intact  Cognition:  Impaired,  Mild  Sleep:  Number of Hours: 8     Treatment Plan Summary: Daily contact with patient to assess and evaluate symptoms and progress in treatment and Medication management   Patient is a 29 year old female with the above-stated past psychiatric history who is seen in follow-up.  Chart reviewed. Patient discussed with nursing. Patient continues to express paranoid persecutory delusions; no aggressive behavior in the unit. Compliance with medications encouraged. Continue morning dose of antipsychotic (Geodon 20mg  QAM), decrease nighttime Geodon 20mg  HS, increase Zyprexa 15 mg at bedtime, continue Lithium 900mg  HS (lithium level 0.8). Patient has previously failed monotherapy with Seroquel, Geodon, Abilify, Zyprexa, Saphris, Fanapt, Invega, and Risperdal.  Plan: -continue inpatient psych admission; 15-minute checks; daily contact with patient to assess and evaluate symptoms and progress in treatment;  psychoeducation.  -continue scheduled medications: increase nighttime Geodon . benztropine  1 mg Oral QHS  . docusate sodium  100 mg Oral BID  . feeding supplement  237 mL Oral BID BM  . lithium carbonate  900 mg Oral QHS  . multivitamin with minerals  1 tablet Oral Daily  . OLANZapine  15 mg Oral QHS  . ziprasidone  20 mg Oral QPC breakfast  . ziprasidone  20 mg Oral QHS   -continue PRN medications.  acetaminophen, magnesium hydroxide, OLANZapine zydis, simethicone, ziprasidone  -Pertinent Labs: lipid panel reviewed - WNL. Li level 12/08 - 0.3mmol/L.  -EKG: on 12/9 showed Qtc 417.    -Consults: No new consults placed since yesterday    -Disposition: patient is not ready for d/c yet. All necessary aftercare will be arranged prior to discharge Likely d/c home with outpatient psych follow-up.  -  I certify that the patient does need, on a daily basis, active treatment furnished directly by or requiring the supervision of inpatient psychiatric facility personnel.   , MD 11/15/2020, 12:11 PM

## 2020-11-15 NOTE — Progress Notes (Signed)
Pt is alert and oriented to person, place, time and situation. Pt is calm, cooperative, denies suicidal and homicidal ideation, denies depression and anxiety. Pt is very focused on discharge, reports one of the reasons she should be discharged is due to her menstrual period, states that she is worried that "others will know or smell me." Pt has showered and groomed. Pt reports feelings of paranoia. Pt c/o of her colace medication causing her "too much gas." Pt reports a bowel movement today. Pt refused 2nd colace scheduled for today. Pt became tearful during breakfast reporting she felt pressured to eat. At dinner pt ate without prompting. Pt reports "I am worried that I will get in trouble if I get that water but I normally drink water when I'm at home it's just being here that I get worried I'll be held accountable." Will continue to monitor pt per Q15 minute face checks and monitor for safety and progress.

## 2020-11-15 NOTE — Plan of Care (Signed)
Patient was visible in the milieu for the majority of the shift. Complained of helplessness reporting that "all the nurses ignored me...maybe they want me to die...". Was mildly confused. Patient  focused on nurses but unable to report what the exact issue was. Attended group and reported the same issue. Patient had a snack and received HS medications. Went to bed and currently resting. No sign of distress noted. Safety monitored as recommended.

## 2020-11-15 NOTE — Progress Notes (Signed)
Recreation Therapy Notes  Date: 11/15/2020  Time: 9:30 am   Location: Craft room  Behavioral response: Appropriate  Intervention Topic: Communication  Discussion/Intervention:  Group content today was focused on communication. The group defined communication and ways to communicate with others. Individuals stated reason why communication is important and some reasons to communicate with others. Patients expressed if they thought they were good at communicating with others and ways they could improve their communication skills. The group identified important parts of communication and some experiences they have had in the past with communication. The group participated in the intervention "What is that?", where they had a chance to test out their communication skills and identify ways to improve their communication techniques.  Clinical Observations/Feedback: Patient came to group and was focused on what peers and staff had to say about communication. Individual was social with peers and staff participating in the intervention.  Rayon Mcchristian LRT/CTRS         Lamonte Hartt 11/15/2020 11:55 AM

## 2020-11-16 MED ORDER — OLANZAPINE 10 MG PO TABS
20.0000 mg | ORAL_TABLET | Freq: Every day | ORAL | Status: DC
  Administered 2020-11-16 – 2020-11-19 (×4): 20 mg via ORAL
  Filled 2020-11-16 (×4): qty 2

## 2020-11-16 NOTE — Progress Notes (Signed)
D: Pt alert and oriented. Pt rates depression 0/10, hopelessness 0/10, and anxiety 0/10. Pt goal: "Discharge." Pt reports energy level as normal and concentration as being good. Pt reports sleep last night as being good. Pt did receive medications for sleep. Pt denies experiencing any pain at this time. Pt denies experiencing any SI/HI, or AVH at this time.   Pt did eat majority of her dinner leaving very little on her tray, in comparison to breakfast and lunch. Pt is still preoccupied with her insurance paying for her meals. Pt also still ask why and if she has to drink her ensures. This writer explained the drinks are filled with vitamins, nutrients, and protein that the body really needs.   A: Scheduled medications administered to pt, per MD orders. Support and encouragement provided. Frequent verbal contact made. Routine safety checks conducted q15 minutes.   R: No adverse drug reactions noted. Pt verbally contracts for safety at this time. Pt complaint with medications and treatment plan. Pt interacts well with others on the unit. Pt remains safe at this time. Will continue to monitor.

## 2020-11-16 NOTE — Tx Team (Signed)
Interdisciplinary Treatment and Diagnostic Plan Update  11/16/2020 Time of Session: 10:30 AM  Natalie [name] MRN: [id number]  Principal Diagnosis: Bipolar affective disorder, current episode manic with psychotic symptoms (HCC)  Secondary Diagnoses: Principal Problem:   Bipolar affective disorder, current episode manic with psychotic symptoms (HCC) Active Problems:   OCD (obsessive compulsive disorder)   Current Medications:  Current Facility-Administered Medications  Medication Dose Route Frequency Provider Last Rate Last Admin  . acetaminophen (TYLENOL) tablet 650 mg  650 mg Oral Q6H PRN Clapacs, John T, MD      . benztropine (COGENTIN) tablet 1 mg  1 mg Oral QHS Jesse Sans, MD   1 mg at 11/15/20 2116  . docusate sodium (COLACE) capsule 100 mg  100 mg Oral BID Clapacs, Jackquline Denmark, MD   100 mg at 11/16/20 0756  . feeding supplement (ENSURE ENLIVE / ENSURE PLUS) liquid 237 mL  237 mL Oral BID BM Jesse Sans, MD   237 mL at 11/16/20 1133  . lithium carbonate (ESKALITH) CR tablet 900 mg  900 mg Oral QHS Clapacs, Jackquline Denmark, MD   900 mg at 11/15/20 2116  . magnesium hydroxide (MILK OF MAGNESIA) suspension 30 mL  30 mL Oral Daily PRN Clapacs, Jackquline Denmark, MD   30 mL at 11/15/20 0752  . multivitamin with minerals tablet 1 tablet  1 tablet Oral Daily Jesse Sans, MD   1 tablet at 11/16/20 0756  . OLANZapine (ZYPREXA) tablet 15 mg  15 mg Oral QHS Jesse Sans, MD   15 mg at 11/15/20 2116  . OLANZapine zydis (ZYPREXA) disintegrating tablet 10 mg  10 mg Oral BID PRN Clapacs, Jackquline Denmark, MD   10 mg at 11/10/20 1811  . simethicone (MYLICON) chewable tablet 80 mg  80 mg Oral QID PRN Jesse Sans, MD      . ziprasidone (GEODON) capsule 20 mg  20 mg Oral QPC breakfast Thalia Party, MD   20 mg at 11/16/20 0805  . ziprasidone (GEODON) capsule 20 mg  20 mg Oral QHS Jesse Sans, MD   20 mg at 11/15/20 2118  . ziprasidone (GEODON) injection 20 mg  20 mg Intramuscular Daily PRN Clapacs, Jackquline Denmark,  MD       PTA Medications: Medications Prior to Admission  Medication Sig Dispense Refill Last Dose  . benztropine (COGENTIN) 0.5 MG tablet Take 1 tablet (0.5 mg total) by mouth 2 (two) times daily as needed for tremors. (Patient taking differently: Take 1 mg by mouth at bedtime. ) 60 tablet 0   . docusate sodium (COLACE) 100 MG capsule Take 1 capsule (100 mg total) by mouth 2 (two) times daily. For constipation (Patient not taking: Reported on 11/06/2020) 60 capsule 0   . lithium carbonate (ESKALITH) 450 MG CR tablet Take 2 tablets (900 mg total) by mouth at bedtime. For mood stabilization (Patient taking differently: Take 900 mg by mouth at bedtime. ) 60 tablet 0   . senna (SENOKOT) 8.6 MG TABS tablet Take every other day after iron (Patient taking differently: Take 1-2 tablets by mouth daily as needed for mild constipation. ) 120 tablet 0   . senna-docusate (SENOKOT-S) 8.6-50 MG tablet Take 1 tablet by mouth at bedtime. (May buy from over the counter): For constipation (Patient not taking: Reported on 11/06/2020) 1 tablet 0   . ziprasidone (GEODON) 40 MG capsule Take 40 mg by mouth at bedtime.       Patient Stressors: Loss of no details given  Marital or family conflict Medication change or noncompliance  Patient Strengths: Barrister's clerk for treatment/growth  Treatment Modalities: Medication Management, Group therapy, Case management,  1 to 1 session with clinician, Psychoeducation, Recreational therapy.   Physician Treatment Plan for Primary Diagnosis: Bipolar affective disorder, current episode manic with psychotic symptoms (HCC) Long Term Goal(s): Improvement in symptoms so as ready for discharge Improvement in symptoms so as ready for discharge   Short Term Goals: Ability to identify changes in lifestyle to reduce recurrence of condition will improve Ability to verbalize feelings will improve Ability to disclose and discuss suicidal ideas Ability to demonstrate  self-control will improve Ability to identify and develop effective coping behaviors will improve Ability to maintain clinical measurements within normal limits will improve Compliance with prescribed medications will improve Ability to identify changes in lifestyle to reduce recurrence of condition will improve Ability to verbalize feelings will improve Ability to disclose and discuss suicidal ideas Ability to demonstrate self-control will improve Ability to identify and develop effective coping behaviors will improve Ability to maintain clinical measurements within normal limits will improve Compliance with prescribed medications will improve  Medication Management: Evaluate patient's response, side effects, and tolerance of medication regimen.  Therapeutic Interventions: 1 to 1 sessions, Unit Group sessions and Medication administration.  Evaluation of Outcomes: Progressing  Physician Treatment Plan for Secondary Diagnosis: Principal Problem:   Bipolar affective disorder, current episode manic with psychotic symptoms (HCC) Active Problems:   OCD (obsessive compulsive disorder)  Long Term Goal(s): Improvement in symptoms so as ready for discharge Improvement in symptoms so as ready for discharge   Short Term Goals: Ability to identify changes in lifestyle to reduce recurrence of condition will improve Ability to verbalize feelings will improve Ability to disclose and discuss suicidal ideas Ability to demonstrate self-control will improve Ability to identify and develop effective coping behaviors will improve Ability to maintain clinical measurements within normal limits will improve Compliance with prescribed medications will improve Ability to identify changes in lifestyle to reduce recurrence of condition will improve Ability to verbalize feelings will improve Ability to disclose and discuss suicidal ideas Ability to demonstrate self-control will improve Ability to identify  and develop effective coping behaviors will improve Ability to maintain clinical measurements within normal limits will improve Compliance with prescribed medications will improve     Medication Management: Evaluate patient's response, side effects, and tolerance of medication regimen.  Therapeutic Interventions: 1 to 1 sessions, Unit Group sessions and Medication administration.  Evaluation of Outcomes: Progressing   RN Treatment Plan for Primary Diagnosis: Bipolar affective disorder, current episode manic with psychotic symptoms (HCC) Long Term Goal(s): Knowledge of disease and therapeutic regimen to maintain health will improve  Short Term Goals: Ability to demonstrate self-control, Ability to participate in decision making will improve, Ability to verbalize feelings will improve, Ability to disclose and discuss suicidal ideas, Ability to identify and develop effective coping behaviors will improve and Compliance with prescribed medications will improve  Medication Management: RN will administer medications as ordered by provider, will assess and evaluate patient's response and provide education to patient for prescribed medication. RN will report any adverse and/or side effects to prescribing provider.  Therapeutic Interventions: 1 on 1 counseling sessions, Psychoeducation, Medication administration, Evaluate responses to treatment, Monitor vital signs and CBGs as ordered, Perform/monitor CIWA, COWS, AIMS and Fall Risk screenings as ordered, Perform wound care treatments as ordered.  Evaluation of Outcomes: Progressing   LCSW Treatment Plan for Primary Diagnosis: Bipolar affective disorder, current episode  manic with psychotic symptoms (HCC) Long Term Goal(s): Safe transition to appropriate next level of care at discharge, Engage patient in therapeutic group addressing interpersonal concerns.  Short Term Goals: Engage patient in aftercare planning with referrals and resources, Increase  social support, Increase ability to appropriately verbalize feelings, Increase emotional regulation, Facilitate acceptance of mental health diagnosis and concerns and Increase skills for wellness and recovery  Therapeutic Interventions: Assess for all discharge needs, 1 to 1 time with Social worker, Explore available resources and support systems, Assess for adequacy in community support network, Educate family and significant other(s) on suicide prevention, Complete Psychosocial Assessment, Interpersonal group therapy.  Evaluation of Outcomes: Progressing   Progress in Treatment: Attending groups: Yes. Participating in groups: Yes. Taking medication as prescribed: No. Toleration medication: Yes. Family/Significant other contact made: Yes, individual(s) contacted:  Husband, Janey Genta Patient understands diagnosis: Yes. Discussing patient identified problems/goals with staff: Yes. Medical problems stabilized or resolved: Yes. Denies suicidal/homicidal ideation: Yes. Issues/concerns per patient self-inventory: No. Other: None   New problem(s) identified: No, Describe:  None  New Short Term/Long Term Goal(s): Elimination of symptoms of psychosis, medication management for mood stabilization; elimination of SI thoughts; development of comprehensive mental wellness plan. Update 11/16/20: No changes at this time  Patient Goals: "to go home"  Update 11/16/20: No changes at this time    Discharge Plan or Barriers: CSW will assist pt in developing appropriate discharge plans. Update 11/16/20: No changes at this time    Reason for Continuation of Hospitalization: Anxiety Depression Medication stabilization Suicidal ideation  Estimated Length of Stay: TBD  Attendees: Patient: 11/16/2020 11:45 AM  Physician: Dr. Reita May, MD 11/16/2020 11:45 AM  Nursing:  11/16/2020 11:45 AM  RN Care Manager: 11/16/2020 11:45 AM  Social Worker: Susa Simmonds, LCSWA 11/16/2020 11:45 AM   Recreational Therapist:  11/16/2020 11:45 AM  Other:  11/16/2020 11:45 AM  Other:  11/16/2020 11:45 AM  Other: 11/16/2020 11:45 AM    Scribe for Treatment Team: Susa Simmonds, LCSWA 11/16/2020 11:45 AM

## 2020-11-16 NOTE — Progress Notes (Signed)
Pt more alert today. Denies any SI, HI, AVH. Pt reports is ready to go home. Pt is med compliant. Minimal interaction with peers. Less intrusive to staff. Pt stares into the nurses station has decreased.  Encouragement provided. Safety checks maintained. Medications given as prescribed. Pt receptive and remains safe on unit with q 15 min checks

## 2020-11-16 NOTE — [facility] Group Notes (Signed)
  [facility] LCSW Group Therapy Note  Date/Time:  11/16/2020 1:05 PM- 1:48 PM   Type of Therapy and Topic:  Group Therapy:  Feelings About Hospitalization  Participation Level:  Minimal   Description of Group This process group involved patients discussing their feelings related to being hospitalized, as well as the benefits they see to being in the hospital.  These feelings and benefits were itemized.  The group then brainstormed specific ways in which they could seek those same benefits when they discharge and return home.  Therapeutic Goals 1. Patient will identify and describe positive and negative feelings related to hospitalization 2. Patient will verbalize benefits of hospitalization to themselves personally 3. Patients will brainstorm together ways they can obtain similar benefits in the outpatient setting, identify barriers to wellness and possible solutions  Summary of Patient Progress: Patient checked into group feeling better. Patient was dozing in and out of group for the majority of the time. Patient did speak about wanting to go home and knowing she cannot do so at this time.    Therapeutic Modalities Cognitive Behavioral Therapy Motivational Interviewing    Susa Simmonds, [place] 11/16/2020  4:51 PM

## 2020-11-16 NOTE — Progress Notes (Addendum)
[facility] MD Progress Note  11/16/2020 9:16 AM Natalie [name]  MRN:  [id number]   Principal Problem: Bipolar affective disorder, current episode manic with psychotic symptoms (HCC) Diagnosis: Principal Problem:   Bipolar affective disorder, current episode manic with psychotic symptoms (HCC) Active Problems:   OCD (obsessive compulsive disorder)  Natalie [name] is a 29 y.o. female patient who presents to the [facility] unit for treatment of worsened paranoia and depression in the context of psychotropic medication non-adherence.   Subjective:   Patient is being seen by this provider for the first time.  Chart and records reviewed.  She was initially seen for psychiatric examination by Dr. Neale Burly on November 08, 2020.  Today, she reports feeling "regular."  She denies anxiety or depression.  She feels sad that she is here on the unit, misses her home.  She does report that her significant other place her in IVC but he meant well.  States that she did not need to be admitted.  Tolerating her medications well.  Reports indicate that she is not eating much but she reports that she is eating a little more than half of her meals.   She is sleeping well.  Denies paranoia.  No auditory or visual hallucinations.  No tactile or olfactory hallucinations.  He denies thought broadcasting or ideas of reference.  She was told that she is not being charged extra for water and food.  Tells me that she now believes this.  She complains of swelling in her feet.  Per nurse, she is eating a little better. Only slept 1 hour last night.   Labs: No new labs to review  Total Time spent with patient:  Past Psychiatric History: Multiple inpatient hospitilizations. Most recently in August 2021 at [facility] in [place] where she was hospitalized for 2 weeks. She sees Dr. Gilmore Laroche for outpatient management. She was most recently stabilized on Geodon 40 mg QHS, Lithium CR 900 mg QHS, and cogentin 1 mg QHS. Past  medication trials including Seroquel, Abilify, Zyprexa, Saphris, Fanapt, Invega, Risperdal, Tegretol. Lamictal, and Trazodone. History of suicide attempt via overdose in 2014.   Past Medical History:  Past Medical History:  Diagnosis Date  . Anxiety   . Chronic bipolar disorder (HCC)   . Depression   . H/O suicide attempt 05-2013   OD  . Obesity   . OCD (obsessive compulsive disorder)   . Psychiatric diagnosis    History reviewed. No pertinent surgical history. Family History:  Family History  Problem Relation Age of Onset  . Hypertension Mother   . Depression Father   . Hypertension Father   . Bipolar disorder Father   . Hypertension Maternal Grandmother   . Obesity Maternal Grandmother   . Hypertension Paternal Grandmother    Family Psychiatric  History: Father with bipolar disorder  Social History:  Social History   Substance and Sexual Activity  Alcohol Use No     Social History   Substance and Sexual Activity  Drug Use No    Social History   Socioeconomic History  . Marital status: Married    Spouse name: [name]  . Number of children: 0  . Years of education: Not on file  . Highest education level: Not on file  Occupational History  . Occupation: unemployed  Tobacco Use  . Smoking status: Never Smoker  . Smokeless tobacco: Never Used  Vaping Use  . Vaping Use: Never used  Substance and Sexual Activity  . Alcohol use: No  . Drug  use: No  . Sexual activity: Yes    Birth control/protection: I.U.D.  Other Topics Concern  . Not on file  Social History Narrative   Lives with husband   Right handed   Drinks caffeine seldomly   Social Determinants of Health   Financial Resource Strain: Not on file  Food Insecurity: Not on file  Transportation Needs: Not on file  Physical Activity: Not on file  Stress: Not on file  Social Connections: Not on file   Additional Social History:                         Sleep: Poor  Appetite:   Fair  Current Medications: Current Facility-Administered Medications  Medication Dose Route Frequency Provider Last Rate Last Admin  . acetaminophen (TYLENOL) tablet 650 mg  650 mg Oral Q6H PRN Clapacs, John T, MD      . benztropine (COGENTIN) tablet 1 mg  1 mg Oral QHS Jesse Sans, MD   1 mg at 11/15/20 2116  . docusate sodium (COLACE) capsule 100 mg  100 mg Oral BID Clapacs, Jackquline Denmark, MD   100 mg at 11/16/20 0756  . feeding supplement (ENSURE ENLIVE / ENSURE PLUS) liquid 237 mL  237 mL Oral BID BM Jesse Sans, MD   237 mL at 11/13/20 1419  . lithium carbonate (ESKALITH) CR tablet 900 mg  900 mg Oral QHS Clapacs, Jackquline Denmark, MD   900 mg at 11/15/20 2116  . magnesium hydroxide (MILK OF MAGNESIA) suspension 30 mL  30 mL Oral Daily PRN Clapacs, Jackquline Denmark, MD   30 mL at 11/15/20 0752  . multivitamin with minerals tablet 1 tablet  1 tablet Oral Daily Jesse Sans, MD   1 tablet at 11/16/20 0756  . OLANZapine (ZYPREXA) tablet 15 mg  15 mg Oral QHS Jesse Sans, MD   15 mg at 11/15/20 2116  . OLANZapine zydis (ZYPREXA) disintegrating tablet 10 mg  10 mg Oral BID PRN Clapacs, Jackquline Denmark, MD   10 mg at 11/10/20 1811  . simethicone (MYLICON) chewable tablet 80 mg  80 mg Oral QID PRN Jesse Sans, MD      . ziprasidone (GEODON) capsule 20 mg  20 mg Oral QPC breakfast Thalia Party, MD   20 mg at 11/16/20 0805  . ziprasidone (GEODON) capsule 20 mg  20 mg Oral QHS Jesse Sans, MD   20 mg at 11/15/20 2118  . ziprasidone (GEODON) injection 20 mg  20 mg Intramuscular Daily PRN Clapacs, Jackquline Denmark, MD        Lab Results:  No results found for this or any previous visit (from the past 48 hour(s)).  Blood Alcohol level:  Lab Results  Component Value Date   ETH <10 11/06/2020   ETH <10 06/30/2020    Metabolic Disorder Labs: Lab Results  Component Value Date   HGBA1C 4.9 11/09/2020   MPG 93.93 11/09/2020   MPG 100 09/03/2014   Lab Results  Component Value Date   PROLACTIN 34.1 (H)  03/11/2020   Lab Results  Component Value Date   CHOL 141 11/09/2020   TRIG 59 11/09/2020   HDL 45 11/09/2020   CHOLHDL 3.1 11/09/2020   VLDL 12 11/09/2020   LDLCALC 84 11/09/2020   LDLCALC 88 04/21/2020    Physical Findings: AIMS: Facial and Oral Movements Muscles of Facial Expression: None, normal Lips and Perioral Area: None, normal Jaw: None, normal Tongue: None, normal,Extremity  Movements Upper (arms, wrists, hands, fingers): None, normal Lower (legs, knees, ankles, toes): None, normal, Trunk Movements Neck, shoulders, hips: None, normal, Overall Severity Severity of abnormal movements (highest score from questions above): None, normal Incapacitation due to abnormal movements: None, normal Patient's awareness of abnormal movements (rate only patient's report): No Awareness, Dental Status Current problems with teeth and/or dentures?: No Does patient usually wear dentures?: No  CIWA:    COWS:     Musculoskeletal: Strength & Muscle Tone: within normal limits Gait & Station: normal Patient leans: N/A  Psychiatric Specialty Exam: Physical Exam Vitals and nursing note reviewed.  Constitutional:      Appearance: Normal appearance.  HENT:     Head: Normocephalic and atraumatic.     Right Ear: External ear normal.     Left Ear: External ear normal.     Nose: Nose normal.     Mouth/Throat:     Mouth: Mucous membranes are moist.     Pharynx: Oropharynx is clear.  Eyes:     Extraocular Movements: Extraocular movements intact.     Conjunctiva/sclera: Conjunctivae normal.     Pupils: Pupils are equal, round, and reactive to light.  Cardiovascular:     Rate and Rhythm: Normal rate.     Pulses: Normal pulses.  Pulmonary:     Effort: Pulmonary effort is normal.     Breath sounds: Normal breath sounds.  Abdominal:     General: Abdomen is flat.     Palpations: Abdomen is soft.  Musculoskeletal:        General: No swelling. Normal range of motion.     Cervical back:  Normal range of motion and neck supple.  Skin:    General: Skin is warm and dry.  Neurological:     General: No focal deficit present.     Mental Status: She is alert and oriented to person, place, and time.  Psychiatric:        Attention and Perception: She is inattentive.        Mood and Affect: Mood is anxious. Affect is flat.        Speech: Speech is delayed.        Behavior: Behavior is slowed and withdrawn.        Thought Content: Thought content is paranoid and delusional.        Cognition and Memory: Cognition and memory normal.        Judgment: Judgment is inappropriate.       Blood pressure 121/76, pulse (!) 104, temperature 98.1 F (36.7 C), temperature source Oral, resp. rate 18, height 5\' 4"  (1.626 m), weight 75.8 kg, SpO2 100 %.Body mass index is 28.67 kg/m.  General Appearance: poor eye contact  Eye Contact:  Fair  Speech:  Normal Rate  Volume:  Decreased  Mood:  regular  Affect:  Constricted  Thought Process:  Disorganized  Orientation:  Full (Time, Place, and Person)  Thought Content:  Illogical, Delusions and Paranoid Ideation  Suicidal Thoughts:  No  Homicidal Thoughts:  No  Memory:  Immediate;   Fair Recent;   Fair Remote;   NA  Judgement:  Impaired  Insight:  Lacking  Psychomotor Activity:  Restlessness  Concentration:  Concentration: Poor and Attention Span: Poor  Recall:  NA  Fund of Knowledge:  Fair  Language:  Fair  Akathisia:  No  Handed:  Right  AIMS (if indicated):     Assets:  Desire for Improvement Physical Health  ADL's:  Intact  Cognition:  Impaired,  Mild  Sleep:  Number of Hours: 1     Treatment Plan Summary: Daily contact with patient to assess and evaluate symptoms and progress in treatment and Medication management   Patient is a 29 year old female with the above-stated past psychiatric history who is seen in follow-up.  Chart reviewed. Patient discussed with nursing. 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 Compliance with medications encouraged. Continue morning dose of antipsychotic (Geodon 20mg  QAM), decrease nighttime Geodon 20mg  HS, increase Zyprexa 15 mg at bedtime, continue Lithium 900mg  HS (lithium level 0.8). Patient has previously failed monotherapy with Seroquel, Geodon, Abilify, Zyprexa, Saphris, Fanapt, Invega, and Risperdal.  Plan: -continue inpatient psych admission; 15-minute checks; daily contact with patient to assess and evaluate symptoms and progress in treatment; psychoeducation.  -continue scheduled medications: increase nighttime Geodon . benztropine  1 mg Oral QHS  . docusate sodium  100 mg Oral BID  . feeding supplement  237 mL Oral BID BM  . lithium carbonate  900 mg Oral QHS  . multivitamin with minerals  1 tablet Oral Daily  . OLANZapine  15 mg Oral QHS  . ziprasidone  20 mg Oral QPC breakfast  . ziprasidone  20 mg Oral QHS   -continue PRN medications.  acetaminophen, magnesium hydroxide, OLANZapine zydis, simethicone, ziprasidone  -Pertinent Labs: lipid panel reviewed - WNL. Li level 12/08 - 0.22mmol/L.  -EKG: on 12/9 showed Qtc 417.    -Consults: No new consults placed since yesterday    -Disposition: patient is not ready for d/c yet. All necessary aftercare will be arranged prior to discharge Likely d/c home with outpatient psych follow-up.  -  I certify that the patient does need, on a daily basis, active treatment furnished directly by or requiring the supervision of inpatient psychiatric facility personnel.   12/18 Lithium level 1.05 on 12/15 Encouraged to elevate her feet.  Monitor swelling. Increase Zyprexa to 20 mg at night tomorrow D/c date is unknown Will speak with team about her program and plans going forward.    Reggie Pile, MD 11/16/2020, 9:16 AM

## 2020-11-17 NOTE — [facility] Group Notes (Signed)
LCSW Group Therapy Note  11/17/2020   1:35 PM- 2:23 PM   Type of Therapy and Topic:  Group Therapy: Anger Cues and Responses  Participation Level:  None   Description of Group:   In this group, patients learned how to recognize the physical, cognitive, emotional, and behavioral responses they have to anger-provoking situations.  They identified a recent time they became angry and how they reacted.  They analyzed how their reaction was possibly beneficial and how it was possibly unhelpful.  The group discussed a variety of healthier coping skills that could help with such a situation in the future.  Focus was placed on how helpful it is to recognize the underlying emotions to our anger, because working on those can lead to a more permanent solution as well as our ability to focus on the important rather than the urgent.  Therapeutic Goals: 1. Patients will remember their last incident of anger and how they felt emotionally and physically, what their thoughts were at the time, and how they behaved. 2. Patients will identify how their behavior at that time worked for them, as well as how it worked against them. 3. Patients will explore possible new behaviors to use in future anger situations. 4. Patients will learn that anger itself is normal and cannot be eliminated, and that healthier reactions can assist with resolving conflict rather than worsening situations.  Summary of Patient Progress: Patient did not participate during group and would doze in and out.   Therapeutic Modalities:   Cognitive Behavioral Therapy    Susa Simmonds, Theresia Majors 11/17/2020  4:48 PM

## 2020-11-17 NOTE — Plan of Care (Signed)
Patient has been pacing, restless and demanding. Paranoid  and  guarded and requesting to be discharged. Patient has not been eating well. Reporting that medications are not helping.  Patient is   frequent at the nurses station with no major needs. Staff continue to provide support and encouragements. Safety monitored as expected.

## 2020-11-17 NOTE — Plan of Care (Signed)
Patient is in & out of room.Patient states " I took a shower,I ate and I am stable now.Why can't I go home." Patient sitting in the chair in her room states " when I cannot tolerate people I separate myself and sit in the chair." Denies SI,HI and AVH.Ate about 60% of her lunch tray.Compliant with medications.Attended groups.Support and encouragement given.

## 2020-11-17 NOTE — Progress Notes (Signed)
[facility] MD Progress Note  11/17/2020 9:56 AM Marcea Rojek  MRN:  [id number]   Principal Problem: Bipolar affective disorder, current episode manic with psychotic symptoms (HCC) Diagnosis: Principal Problem:   Bipolar affective disorder, current episode manic with psychotic symptoms (HCC) Active Problems:   OCD (obsessive compulsive disorder)  Ms.Teare is a 29 y.o. female patient who presents to the [facility] unit for treatment of worsened paranoia and depression in the context of psychotropic medication non-adherence.   Subjective:   12/19 Objectively, patient has poor eye contact, she slouches when talking.  When asked about this, states that this is her normal self.  "I feel somewhat better.  My body feels better because the 8.  I eat good enough to really go home," she maintains.  Says that she usually does eat a lot at home, especially whole grains.  Says that she is eating less here because of this.  She denies any depression, irritability anxiety hallucinations or delusions.  Says that she really has not spoken to her husband over the phone but says that he is supportive.  I ask if I can call him and she agrees.  However, she would like to be in the room when I call.  I did call his number and left a message.  I will try him again tomorrow.  Says that she slept very well last night.  No new medical issues.  No side effects on her medications.  Update: I did speak to patient's husband this morning who indicates that she is sounding much better.  They spoke yesterday, and for the first time, she did not express any paranoia.  He states that she has done well on Zyprexa 2 years ago and was uncertain why was taken off.  She did really well on Fanapt as well but the insurance would not pay for this medication.  On Risperdal, she experienced lactation issues.  He would like to see her completely off of Geodon before discharge can be considered.  States that her appetite has been poor for the past  several months, this gets worse when she is usually upset.  Patient still maintains that she eats well at home husband indicates that she always feels like people are talking about her outside of her apartment.  He denies her experiencing hallucinations, as far as he knows.  He is thinking middle of the week.  12/18 Patient is being seen by this provider for the first time.  Chart and records reviewed.  She was initially seen for psychiatric examination by Dr. Neale Burly on November 08, 2020.  Today, she reports feeling "regular."  She denies anxiety or depression.  She feels sad that she is here on the unit, misses her home.  She does report that her significant other place her in IVC but he meant well.  States that she did not need to be admitted.  Tolerating her medications well.  Reports indicate that she is not eating much but she reports that she is eating a little more than half of her meals.   She is sleeping well.  Denies paranoia.  No auditory or visual hallucinations.  No tactile or olfactory hallucinations.  He denies thought broadcasting or ideas of reference.  She was told that she is not being charged extra for water and food.  Tells me that she now believes this.  She complains of swelling in her feet.  Per nurse, she is eating a little better. Only slept 1 hour last night.  Labs: No new labs to review  Total Time spent with patient:  Past Psychiatric History: Multiple inpatient hospitilizations. Most recently in August 2021 at [facility] in [place] where she was hospitalized for 2 weeks. She sees Dr. Gilmore Laroche for outpatient management. She was most recently stabilized on Geodon 40 mg QHS, Lithium CR 900 mg QHS, and cogentin 1 mg QHS. Past medication trials including Seroquel, Abilify, Zyprexa, Saphris, Fanapt, Invega, Risperdal, Tegretol. Lamictal, and Trazodone. History of suicide attempt via overdose in 2014.   Past Medical History:  Past Medical History:   Diagnosis Date  . Anxiety   . Chronic bipolar disorder (HCC)   . Depression   . H/O suicide attempt 05-2013   OD  . Obesity   . OCD (obsessive compulsive disorder)   . Psychiatric diagnosis    History reviewed. No pertinent surgical history. Family History:  Family History  Problem Relation Age of Onset  . Hypertension Mother   . Depression Father   . Hypertension Father   . Bipolar disorder Father   . Hypertension Maternal Grandmother   . Obesity Maternal Grandmother   . Hypertension Paternal Grandmother    Family Psychiatric  History: Father with bipolar disorder  Social History:  Social History   Substance and Sexual Activity  Alcohol Use No     Social History   Substance and Sexual Activity  Drug Use No    Social History   Socioeconomic History  . Marital status: Married    Spouse name: [name]  . Number of children: 0  . Years of education: Not on file  . Highest education level: Not on file  Occupational History  . Occupation: unemployed  Tobacco Use  . Smoking status: Never Smoker  . Smokeless tobacco: Never Used  Vaping Use  . Vaping Use: Never used  Substance and Sexual Activity  . Alcohol use: No  . Drug use: No  . Sexual activity: Yes    Birth control/protection: I.U.D.  Other Topics Concern  . Not on file  Social History Narrative   Lives with husband   Right handed   Drinks caffeine seldomly   Social Determinants of Health   Financial Resource Strain: Not on file  Food Insecurity: Not on file  Transportation Needs: Not on file  Physical Activity: Not on file  Stress: Not on file  Social Connections: Not on file   Additional Social History:                         Sleep: Poor  Appetite:  Fair  Current Medications: Current Facility-Administered Medications  Medication Dose Route Frequency Provider Last Rate Last Admin  . acetaminophen (TYLENOL) tablet 650 mg  650 mg Oral Q6H PRN Clapacs, John T, MD      .  benztropine (COGENTIN) tablet 1 mg  1 mg Oral QHS Jesse Sans, MD   1 mg at 11/16/20 2004  . docusate sodium (COLACE) capsule 100 mg  100 mg Oral BID Clapacs, Jackquline Denmark, MD   100 mg at 11/17/20 0801  . feeding supplement (ENSURE ENLIVE / ENSURE PLUS) liquid 237 mL  237 mL Oral BID BM Jesse Sans, MD   237 mL at 11/16/20 1454  . lithium carbonate (ESKALITH) CR tablet 900 mg  900 mg Oral QHS Clapacs, Jackquline Denmark, MD   900 mg at 11/16/20 2004  . magnesium hydroxide (MILK OF MAGNESIA) suspension 30 mL  30 mL Oral Daily PRN Clapacs,  Jackquline Denmark, MD   30 mL at 11/16/20 1636  . multivitamin with minerals tablet 1 tablet  1 tablet Oral Daily Jesse Sans, MD   1 tablet at 11/17/20 0801  . OLANZapine (ZYPREXA) tablet 20 mg  20 mg Oral QHS Reggie Pile, MD   20 mg at 11/16/20 2004  . OLANZapine zydis (ZYPREXA) disintegrating tablet 10 mg  10 mg Oral BID PRN Clapacs, Jackquline Denmark, MD   10 mg at 11/10/20 1811  . simethicone (MYLICON) chewable tablet 80 mg  80 mg Oral QID PRN Jesse Sans, MD   80 mg at 11/16/20 1636  . ziprasidone (GEODON) capsule 20 mg  20 mg Oral QPC breakfast Thalia Party, MD   20 mg at 11/17/20 0801  . ziprasidone (GEODON) capsule 20 mg  20 mg Oral QHS Jesse Sans, MD   20 mg at 11/16/20 2005  . ziprasidone (GEODON) injection 20 mg  20 mg Intramuscular Daily PRN Clapacs, Jackquline Denmark, MD        Lab Results:  No results found for this or any previous visit (from the past 48 hour(s)).  Blood Alcohol level:  Lab Results  Component Value Date   ETH <10 11/06/2020   ETH <10 06/30/2020    Metabolic Disorder Labs: Lab Results  Component Value Date   HGBA1C 4.9 11/09/2020   MPG 93.93 11/09/2020   MPG 100 09/03/2014   Lab Results  Component Value Date   PROLACTIN 34.1 (H) 03/11/2020   Lab Results  Component Value Date   CHOL 141 11/09/2020   TRIG 59 11/09/2020   HDL 45 11/09/2020   CHOLHDL 3.1 11/09/2020   VLDL 12 11/09/2020   LDLCALC 84 11/09/2020   LDLCALC 88 04/21/2020     Physical Findings: AIMS: Facial and Oral Movements Muscles of Facial Expression: None, normal Lips and Perioral Area: None, normal Jaw: None, normal Tongue: None, normal,Extremity Movements Upper (arms, wrists, hands, fingers): None, normal Lower (legs, knees, ankles, toes): None, normal, Trunk Movements Neck, shoulders, hips: None, normal, Overall Severity Severity of abnormal movements (highest score from questions above): None, normal Incapacitation due to abnormal movements: None, normal Patient's awareness of abnormal movements (rate only patient's report): No Awareness, Dental Status Current problems with teeth and/or dentures?: No Does patient usually wear dentures?: No  CIWA:    COWS:     Musculoskeletal: Strength & Muscle Tone: within normal limits Gait & Station: normal Patient leans: N/A  Psychiatric Specialty Exam: Physical Exam Vitals and nursing note reviewed.  Constitutional:      Appearance: Normal appearance.  HENT:     Head: Normocephalic and atraumatic.     Right Ear: External ear normal.     Left Ear: External ear normal.     Nose: Nose normal.     Mouth/Throat:     Mouth: Mucous membranes are moist.     Pharynx: Oropharynx is clear.  Eyes:     Extraocular Movements: Extraocular movements intact.     Conjunctiva/sclera: Conjunctivae normal.     Pupils: Pupils are equal, round, and reactive to light.  Cardiovascular:     Rate and Rhythm: Normal rate.     Pulses: Normal pulses.  Pulmonary:     Effort: Pulmonary effort is normal.     Breath sounds: Normal breath sounds.  Abdominal:     General: Abdomen is flat.     Palpations: Abdomen is soft.  Musculoskeletal:        General: No swelling. Normal range  of motion.     Cervical back: Normal range of motion and neck supple.  Skin:    General: Skin is warm and dry.  Neurological:     General: No focal deficit present.     Mental Status: She is alert and oriented to person, place, and time.   Psychiatric:        Attention and Perception: She is inattentive.        Mood and Affect: Mood is anxious. Affect is flat.        Speech: Speech is delayed.        Behavior: Behavior is slowed and withdrawn.        Thought Content: Thought content is paranoid and delusional.        Cognition and Memory: Cognition and memory normal.        Judgment: Judgment is inappropriate.       Blood pressure 117/82, pulse (!) 129, temperature 98.9 F (37.2 C), temperature source Oral, resp. rate 18, height  (1.626 m), weight 75.8 kg, SpO2 97 %.Body mass index is 28.67 kg/m.  General Appearance: poor eye contact  Eye Contact:  Fair  Speech:  Normal Rate  Volume:  Decreased  Mood:  regular  Affect:  Constricted  Thought Process:  Disorganized  Orientation:  Full (Time, Place, and Person)  Thought Content:  Illogical, Delusions and Paranoid Ideation  Suicidal Thoughts:  No  Homicidal Thoughts:  No  Memory:  Immediate;   Fair Recent;   Fair Remote;   NA  Judgement:  Impaired  Insight:  Lacking  Psychomotor Activity:  Restlessness  Concentration:  Concentration: Poor and Attention Span: Poor  Recall:  NA  Fund of Knowledge:  Fair  Language:  Fair  Akathisia:  No  Handed:  Right  AIMS (if indicated):     Assets:  Desire for Improvement Physical Health  ADL's:  Intact  Cognition:  Impaired,  Mild  Sleep:  Number of Hours: 4.45     Treatment Plan Summary: Daily contact with patient to assess and evaluate symptoms and progress in treatment and Medication management   Patient is a 29 year old female with the above-stated past psychiatric history who is seen in follow-up.  Chart reviewed. Patient discussed with nursing. Patient continues to express paranoid persecutory delusions; no aggressive behavior in the unit. Compliance with medications encouraged. Continue morning dose of antipsychotic (Geodon  QAM), decrease nighttime Geodon  HS, increase Zyprexa 15 mg at bedtime,  continue Lithium  HS (lithium level 0.8). Patient has previously failed monotherapy with Seroquel, Geodon, Abilify, Zyprexa, Saphris, Fanapt, Invega, and Risperdal.  Plan: -continue inpatient psych admission; 15-minute checks; daily contact with patient to assess and evaluate symptoms and progress in treatment; psychoeducation.  -continue scheduled medications: increase nighttime Geodon . benztropine  1 mg Oral QHS  . docusate sodium  100 mg Oral BID  . feeding supplement  237 mL Oral BID BM  . lithium carbonate  900 mg Oral QHS  . multivitamin with minerals  1 tablet Oral Daily  . OLANZapine  20 mg Oral QHS  . ziprasidone  20 mg Oral QPC breakfast  . ziprasidone  20 mg Oral QHS   -continue PRN medications.  acetaminophen, magnesium hydroxide, OLANZapine zydis, simethicone, ziprasidone  -Pertinent Labs: lipid panel reviewed - WNL. Li level 12/08 - 0.41mmol/L.  -EKG: on 12/9 showed Qtc 417.    -Consults: No new consults placed since yesterday    -Disposition: patient is not ready for d/c yet. All  necessary aftercare will be arranged prior to discharge Likely d/c home with outpatient psych follow-up.  -  I certify that the patient does need, on a daily basis, active treatment furnished directly by or requiring the supervision of inpatient psychiatric facility personnel.   12/18 Lithium level 1.05 on 12/15 Encouraged to elevate her feet.  Monitor swelling. Increase Zyprexa to 20 mg at night tomorrow D/c date is unknown Will speak with team about her program and plans going forward.   12/19 Discontinue morning Geodon 20 mg starting tomorrow Tomorrow, discontinue evening Geodon Continue Zyprexa 20 mg Possible discharge middle of the week Spoke with patient's husband today   Reggie Pile, MD 11/17/2020, 9:56 AM

## 2020-11-18 MED ORDER — MAGNESIUM CITRATE PO SOLN
1.0000 | Freq: Once | ORAL | Status: DC
  Filled 2020-11-18: qty 296

## 2020-11-18 MED ORDER — MIRTAZAPINE 15 MG PO TABS: 7.5000 mg | ORAL_TABLET | Freq: Every evening | ORAL | Status: DC | PRN

## 2020-11-18 NOTE — [facility] Group Notes (Signed)
LCSW Group Therapy Note   11/18/2020 2:19 PM  Type of Therapy and Topic:  Group Therapy:  Overcoming Obstacles   Participation Level:  Minimal   Description of Group:    In this group patients will be encouraged to explore what they see as obstacles to their own wellness and recovery. They will be guided to discuss their thoughts, feelings, and behaviors related to these obstacles. The group will process together ways to cope with barriers, with attention given to specific choices patients can make. Each patient will be challenged to identify changes they are motivated to make in order to overcome their obstacles. This group will be process-oriented, with patients participating in exploration of their own experiences as well as giving and receiving support and challenge from other group members.   Therapeutic Goals: 1. Patient will identify personal and current obstacles as they relate to admission. 2. Patient will identify barriers that currently interfere with their wellness or overcoming obstacles.  3. Patient will identify feelings, thought process and behaviors related to these barriers. 4. Patient will identify two changes they are willing to make to overcome these obstacles:      Summary of Patient Progress Patient was present for the entirety of the group session. Patient participated in group introduction, otherwise, patient did not contribute to the topic of discussion. Patient's head was down for most of group.      Therapeutic Modalities:   Cognitive Behavioral Therapy Solution Focused Therapy Motivational Interviewing Relapse Prevention Therapy  Gwenevere Ghazi, MSW, [place], [place] 11/18/2020 2:19 PM

## 2020-11-18 NOTE — Plan of Care (Signed)
Patient is making more eye contacts and smiling more often now but still fixated on discharge.Denies SI,HI and AVH.Patient is at the nurses station very often with multiple questions. Patient refused Mag citrate and colace. States " mag citrate makes loose stool and colace makes gas." Patient stated that constipation is not bothering her.Patient took ensure and 50% from her meal tray. ADLs maintained. Attended groups.Support and encouragement given.

## 2020-11-18 NOTE — Progress Notes (Signed)
Patient presents with a better affect than this writer has seen during this admission. Education,support, and encouragement givento be active in her treatment plan. Patient denies SI/HI/AVH. Patient endorses anxiety stating it's from being in here. Patient being monitored Q 15 minutes for safety per unit protocol. Pt remains safe on the unit.Patient is preoccupied with leaving, patient given education. Patient states that she is feeling much better and is scheduled to leave on Wednesday.  

## 2020-11-18 NOTE — Plan of Care (Signed)
Patient presents with a better affect than when this writer had her during this admission   Problem: Education: Goal: Emotional status will improve Outcome: Progressing Goal: Mental status will improve Outcome: Progressing

## 2020-11-18 NOTE — Progress Notes (Addendum)
[facility] MD Progress Note  11/18/2020 8:25 AM Natalie [name]  MRN:  [id number]   Principal Problem: Bipolar affective disorder, current episode manic with psychotic symptoms (HCC) Diagnosis: Principal Problem:   Bipolar affective disorder, current episode manic with psychotic symptoms (HCC) Active Problems:   OCD (obsessive compulsive disorder)  Natalie [name] is a 29 y.o. female patient who presents to the [facility] unit for treatment of worsened paranoia and depression in the context of psychotropic medication non-adherence.   Subjective:   12/20 Patient seen this morning after breakfast.  She denies depression, irritability, depression and anxiety.  She did sleep well.  Asks about discharge.  Says that she ate everything today for breakfast but had limited choices.  Says that she does not remember what she ordered.  Concerned about having a lack of clothing and hygiene products here on like home.  Asked why she cannot get money from her locker to buy Gatorade.  Wonders if she can just pay for her food here.  This was discussed prior that is included in her stay. Comments on feeling sleeping sedated through out the day.  Update: Patient approaches this provider several times.  Fixates on discharge again.  Also complains about dozing off to sleep throughout the day.  It is documented that she is not sleeping but she asserts that this is not true.  She finally acknowledges that she does not feel safe in this environment to sleep.  "When can I go home?"  12/19 Objectively, patient has poor eye contact, she slouches when talking.  When asked about this, states that this is her normal self.  "I feel somewhat better.  My body feels better because the 8.  I eat good enough to really go home," she maintains.  Says that she usually does eat a lot at home, especially whole grains.  Says that she is eating less here because of this.  She denies any depression, irritability anxiety hallucinations or delusions.  Says  that she really has not spoken to her husband over the phone but says that he is supportive.  I ask if I can call him and she agrees.  However, she would like to be in the room when I call.  I did call his number and left a message.  I will try him again tomorrow.  Says that she slept very well last night.  No new medical issues.  No side effects on her medications.  Update: I did speak to patient's husband this morning who indicates that she is sounding much better.  They spoke yesterday, and for the first time, she did not express any paranoia.  He states that she has done well on Zyprexa 2 years ago and was uncertain why was taken off.  She did really well on Fanapt as well but the insurance would not pay for this medication.  On Risperdal, she experienced lactation issues.  He would like to see her completely off of Geodon before discharge can be considered.  States that her appetite has been poor for the past several months, this gets worse when she is usually upset.  Patient still maintains that she eats well at home husband indicates that she always feels like people are talking about her outside of her apartment.  He denies her experiencing hallucinations, as far as he knows.  He is thinking middle of the week.  12/18 Patient is being seen by this provider for the first time.  Chart and records reviewed.  She was initially  seen for psychiatric examination by Dr. Neale Burly on November 08, 2020.  Today, she reports feeling "regular."  She denies anxiety or depression.  She feels sad that she is here on the unit, misses her home.  She does report that her significant other place her in IVC but he meant well.  States that she did not need to be admitted.  Tolerating her medications well.  Reports indicate that she is not eating much but she reports that she is eating a little more than half of her meals.   She is sleeping well.  Denies paranoia.  No auditory or visual hallucinations.  No tactile or  olfactory hallucinations.  He denies thought broadcasting or ideas of reference.  She was told that she is not being charged extra for water and food.  Tells me that she now believes this.  She complains of swelling in her feet.  Per nurse, she is eating a little better. Only slept 1 hour last night.   Labs: No new labs to review  Total Time spent with patient:  Past Psychiatric History: Multiple inpatient hospitilizations. Most recently in August 2021 at [facility] in [place] where she was hospitalized for 2 weeks. She sees Dr. Gilmore Laroche for outpatient management. She was most recently stabilized on Geodon 40 mg QHS, Lithium CR 900 mg QHS, and cogentin 1 mg QHS. Past medication trials including Seroquel, Abilify, Zyprexa, Saphris, Fanapt, Invega, Risperdal, Tegretol. Lamictal, and Trazodone. History of suicide attempt via overdose in 2014.   Past Medical History:  Past Medical History:  Diagnosis Date  . Anxiety   . Chronic bipolar disorder (HCC)   . Depression   . H/O suicide attempt 05-2013   OD  . Obesity   . OCD (obsessive compulsive disorder)   . Psychiatric diagnosis    History reviewed. No pertinent surgical history. Family History:  Family History  Problem Relation Age of Onset  . Hypertension Mother   . Depression Father   . Hypertension Father   . Bipolar disorder Father   . Hypertension Maternal Grandmother   . Obesity Maternal Grandmother   . Hypertension Paternal Grandmother    Family Psychiatric  History: Father with bipolar disorder  Social History:  Social History   Substance and Sexual Activity  Alcohol Use No     Social History   Substance and Sexual Activity  Drug Use No    Social History   Socioeconomic History  . Marital status: Married    Spouse name: [name]  . Number of children: 0  . Years of education: Not on file  . Highest education level: Not on file  Occupational History  . Occupation: unemployed  Tobacco Use  .  Smoking status: Never Smoker  . Smokeless tobacco: Never Used  Vaping Use  . Vaping Use: Never used  Substance and Sexual Activity  . Alcohol use: No  . Drug use: No  . Sexual activity: Yes    Birth control/protection: I.U.D.  Other Topics Concern  . Not on file  Social History Narrative   Lives with husband   Right handed   Drinks caffeine seldomly   Social Determinants of Health   Financial Resource Strain: Not on file  Food Insecurity: Not on file  Transportation Needs: Not on file  Physical Activity: Not on file  Stress: Not on file  Social Connections: Not on file   Additional Social History:  Sleep: Poor  Appetite:  Fair  Current Medications: Current Facility-Administered Medications  Medication Dose Route Frequency Provider Last Rate Last Admin  . acetaminophen (TYLENOL) tablet 650 mg  650 mg Oral Q6H PRN Clapacs, John T, MD      . benztropine (COGENTIN) tablet 1 mg  1 mg Oral QHS Jesse Sans, MD   1 mg at 11/17/20 2111  . docusate sodium (COLACE) capsule 100 mg  100 mg Oral BID Clapacs, Jackquline Denmark, MD   100 mg at 11/18/20 0738  . feeding supplement (ENSURE ENLIVE / ENSURE PLUS) liquid 237 mL  237 mL Oral BID BM Jesse Sans, MD   237 mL at 11/17/20 1420  . lithium carbonate (ESKALITH) CR tablet 900 mg  900 mg Oral QHS Clapacs, Jackquline Denmark, MD   900 mg at 11/17/20 2111  . magnesium hydroxide (MILK OF MAGNESIA) suspension 30 mL  30 mL Oral Daily PRN Clapacs, Jackquline Denmark, MD   30 mL at 11/17/20 1430  . multivitamin with minerals tablet 1 tablet  1 tablet Oral Daily Jesse Sans, MD   1 tablet at 11/18/20 [phone]  . OLANZapine (ZYPREXA) tablet 20 mg  20 mg Oral QHS Reggie Pile, MD   20 mg at 11/17/20 2111  . OLANZapine zydis (ZYPREXA) disintegrating tablet 10 mg  10 mg Oral BID PRN Clapacs, Jackquline Denmark, MD   10 mg at 11/10/20 1811  . simethicone (MYLICON) chewable tablet 80 mg  80 mg Oral QID PRN Jesse Sans, MD   80 mg at 11/17/20 1430   . ziprasidone (GEODON) capsule 20 mg  20 mg Oral QHS Jesse Sans, MD   20 mg at 11/17/20 2111  . ziprasidone (GEODON) injection 20 mg  20 mg Intramuscular Daily PRN Clapacs, Jackquline Denmark, MD        Lab Results:  No results found for this or any previous visit (from the past 48 hour(s)).  Blood Alcohol level:  Lab Results  Component Value Date   ETH <10 11/06/2020   ETH <10 06/30/2020    Metabolic Disorder Labs: Lab Results  Component Value Date   HGBA1C 4.9 11/09/2020   MPG 93.93 11/09/2020   MPG 100 09/03/2014   Lab Results  Component Value Date   PROLACTIN 34.1 (H) 03/11/2020   Lab Results  Component Value Date   CHOL 141 11/09/2020   TRIG 59 11/09/2020   HDL 45 11/09/2020   CHOLHDL 3.1 11/09/2020   VLDL 12 11/09/2020   LDLCALC 84 11/09/2020   LDLCALC 88 04/21/2020    Physical Findings: AIMS: Facial and Oral Movements Muscles of Facial Expression: None, normal Lips and Perioral Area: None, normal Jaw: None, normal Tongue: None, normal,Extremity Movements Upper (arms, wrists, hands, fingers): None, normal Lower (legs, knees, ankles, toes): None, normal, Trunk Movements Neck, shoulders, hips: None, normal, Overall Severity Severity of abnormal movements (highest score from questions above): None, normal Incapacitation due to abnormal movements: None, normal Patient's awareness of abnormal movements (rate only patient's report): No Awareness, Dental Status Current problems with teeth and/or dentures?: No Does patient usually wear dentures?: No  CIWA:    COWS:     Musculoskeletal: Strength & Muscle Tone: within normal limits Gait & Station: normal Patient leans: N/A  Psychiatric Specialty Exam: Physical Exam Vitals and nursing note reviewed.  Constitutional:      Appearance: Normal appearance.  HENT:     Head: Normocephalic and atraumatic.     Right Ear: External ear normal.  Left Ear: External ear normal.     Nose: Nose normal.     Mouth/Throat:      Mouth: Mucous membranes are moist.     Pharynx: Oropharynx is clear.  Eyes:     Extraocular Movements: Extraocular movements intact.     Conjunctiva/sclera: Conjunctivae normal.     Pupils: Pupils are equal, round, and reactive to light.  Cardiovascular:     Rate and Rhythm: Normal rate.     Pulses: Normal pulses.  Pulmonary:     Effort: Pulmonary effort is normal.     Breath sounds: Normal breath sounds.  Abdominal:     General: Abdomen is flat.     Palpations: Abdomen is soft.  Musculoskeletal:        General: No swelling. Normal range of motion.     Cervical back: Normal range of motion and neck supple.  Skin:    General: Skin is warm and dry.  Neurological:     General: No focal deficit present.     Mental Status: She is alert and oriented to person, place, and time.  Psychiatric:        Attention and Perception: She is inattentive.        Mood and Affect: Mood is anxious. Affect is flat.        Speech: Speech is delayed.        Behavior: Behavior is slowed and withdrawn.        Thought Content: Thought content is paranoid and delusional.        Cognition and Memory: Cognition and memory normal.        Judgment: Judgment is inappropriate.       Blood pressure 126/77, pulse (!) 107, temperature 98.6 F (37 C), temperature source Oral, resp. rate 18, height  (1.626 m), weight 75.8 kg, SpO2 100 %.Body mass index is 28.67 kg/m.  General Appearance: poor eye contact  Eye Contact:  Fair  Speech:  Normal Rate  Volume:  Decreased  Mood:  regular  Affect:  Constricted  Thought Process:  Disorganized  Orientation:  Full (Time, Place, and Person)  Thought Content:  Illogical, Delusions and Paranoid Ideation  Suicidal Thoughts:  No  Homicidal Thoughts:  No  Memory:  Immediate;   Fair Recent;   Fair Remote;   NA  Judgement:  Impaired  Insight:  Lacking  Psychomotor Activity:  Restlessness  Concentration:  Concentration: Poor and Attention Span: Poor  Recall:   NA  Fund of Knowledge:  Fair  Language:  Fair  Akathisia:  No  Handed:  Right  AIMS (if indicated):     Assets:  Desire for Improvement Physical Health  ADL's:  Intact  Cognition:  Impaired,  Mild  Sleep:  Number of Hours: 2.5     Treatment Plan Summary: Daily contact with patient to assess and evaluate symptoms and progress in treatment and Medication management   Patient is a 29 year old female with the above-stated past psychiatric history who is seen in follow-up.  Chart reviewed. Patient discussed with nursing. Patient continues to express paranoid persecutory delusions; no aggressive behavior in the unit. Compliance with medications encouraged. Continue morning dose of antipsychotic (Geodon  QAM), decrease nighttime Geodon  HS, increase Zyprexa 15 mg at bedtime, continue Lithium  HS (lithium level 0.8). Patient has previously failed monotherapy with Seroquel, Geodon, Abilify, Zyprexa, Saphris, Fanapt, Invega, and Risperdal.  Plan: -continue inpatient psych admission; 15-minute checks; daily contact with patient to assess and evaluate symptoms and progress  in treatment; psychoeducation.  -continue scheduled medications: increase nighttime Geodon . benztropine  1 mg Oral QHS  . docusate sodium  100 mg Oral BID  . feeding supplement  237 mL Oral BID BM  . lithium carbonate  900 mg Oral QHS  . multivitamin with minerals  1 tablet Oral Daily  . OLANZapine  20 mg Oral QHS  . ziprasidone  20 mg Oral QHS   -continue PRN medications.  acetaminophen, magnesium hydroxide, OLANZapine zydis, simethicone, ziprasidone  -Pertinent Labs: lipid panel reviewed - WNL. Li level 12/08 - 0.39mmol/L.  -EKG: on 12/9 showed Qtc 417.    -Consults: No new consults placed since yesterday    -Disposition: patient is not ready for d/c yet. All necessary aftercare will be arranged prior to discharge Likely d/c home with outpatient psych follow-up.  -  I certify that the patient does  need, on a daily basis, active treatment furnished directly by or requiring the supervision of inpatient psychiatric facility personnel.   12/18 Lithium level 1.05 on 12/15 Encouraged to elevate her feet.  Monitor swelling. Increase Zyprexa to 20 mg at night tomorrow D/c date is unknown Will speak with team about her program and plans going forward.   12/19 Discontinue morning Geodon 20 mg starting tomorrow Tomorrow, discontinue evening Geodon Continue Zyprexa 20 mg Possible discharge middle of the week Spoke with patient's husband today  12/20 D/c geodon Possible d/c on Wednesday but will defer to primary attending Mag [facility] x 1 (update: pt refused) Add Remeron 7.5mg  PO q HS PRN insomnia   Reggie Pile, MD 11/18/2020, 8:25 AM

## 2020-11-18 NOTE — Progress Notes (Signed)
Recreation Therapy Notes Date: 11/18/2020  Time: 9:30 am   Location: Craft room  Behavioral response: Appropriate  Intervention Topic: Stress Management   Discussion/Intervention:  Group content on today was focused on stress. The group defined stress and way to cope with stress. Participants expressed how they know when they are stresses out. Individuals described the different ways they have to cope with stress. The group stated reasons why it is important to cope with stress. Patient explained what good stress is and some examples. The group participated in the intervention "Stress Management Jeopardy". Individuals were separated into two group and answered questions related to stress.  Clinical Observations/Feedback: Patient came to group and was focused on what peers and staff had to say about stress management. Individual was social with peers and staff participating in the intervention.  Walida Cajas LRT/CTRS         Errin Whitelaw 11/18/2020 11:04 AM

## 2020-11-19 NOTE — Progress Notes (Signed)
D: Pt alert and oriented. Pt rates depression 0/10, hopelessness 0/10, and anxiety 3/10. Pt goal: "Discharge." Pt reports energy level as normal and concentration as being good. Pt reports sleep last night as being good. Pt did receive medications for sleep. Pt denies experiencing any pain at this time. Pt denies experiencing any SI/HI, or AVH at this time.   This afternoon pt made the statement that different people are telling her different things about how much she needed to eat and asked this writer how much she should be eating. This Clinical research associate stated that I expected her to eat as much as she was able to which is more than just an applesauce. Pt later approached this writer in the medication room tearful stating "I just can't do this, I don't think I can make it through this day, it's just too rough." This writer asked what was so rough, what is she unable to do? Pt stated "she's not use to being in a place like this or being around these people."    Pt has been preoccupied all day with discharging and is she going to discharge and has she done everything to discharge. Pt also continues to state it's been a really really tough day. When asked how pt is unable to give detail other than stating because I've had to ask to things. When asked what things did you have to ask for that was difficult the pt was unable to give any details or examples.   Pt only ate apple sauce and mash potatoes for lunch.   Pt appears to still be very indecisive.   A: Scheduled medications administered to pt, per MD orders. Support and encouragement provided. Frequent verbal contact made. Routine safety checks conducted q15 minutes.   R: No adverse drug reactions noted. Pt verbally contracts for safety at this time. Pt complaint with medications and treatment plan. Pt interacts well with others on the unit. Pt remains safe at this time. Will continue to monitor.

## 2020-11-19 NOTE — Plan of Care (Signed)
Patient calm and cooperative during assessment. Patient states she feels ready to leave tomorrow   Problem: Education: Goal: Emotional status will improve Outcome: Progressing Goal: Mental status will improve Outcome: Progressing

## 2020-11-19 NOTE — Progress Notes (Signed)
Recreation Therapy Notes   Date: 11/19/2020  Time: 9:30 am   Location: Craft room  Behavioral response: Appropriate  Intervention Topic: Self-esteem   Discussion/Intervention:  Group content today was focused on self-esteem. Patient defined self-esteem and where it comes form. The group described reasons self-esteem is important. Individuals stated things that impact self-esteem and positive ways to improve self-esteem. The group participated in the intervention "Collage of Me" where patients were able to create a collage of positive things that makes them who they are. Clinical Observations/Feedback: Patient came to group and was focused on what peers and staff had to say about self-esteem. Individual was social with peers and staff participating in the intervention.  Drequan Ironside LRT/CTRS         Jataya Wann 11/19/2020 11:38 AM

## 2020-11-19 NOTE — Progress Notes (Signed)
Patient presents with a better affect than this writer has seen during this admission. Education,support, and encouragement givento be active in her treatment plan. Patient denies SI/HI/AVH. Patient endorses anxiety stating it's from being in here. Patient being monitored Q 15 minutes for safety per unit protocol. Pt remains safe on the unit.Patient is preoccupied with leaving, patient given education. Patient states that she is feeling much better and is scheduled to leave on Wednesday.

## 2020-11-19 NOTE — Progress Notes (Addendum)
[facility] MD Progress Note  11/19/2020 6:48 AM Natalie [name]  MRN:  [id number]   Principal Problem: Bipolar affective disorder, current episode manic with psychotic symptoms (HCC) Diagnosis: Principal Problem:   Bipolar affective disorder, current episode manic with psychotic symptoms (HCC) Active Problems:   OCD (obsessive compulsive disorder)  [name] is a 29 y.o. female patient who presents to the [facility] unit for treatment of worsened paranoia and depression in the context of psychotropic medication non-adherence.   Subjective:   12/21 Patient was seen early this morning.  Tells me "I slept as much as I could."  Still indicates that she is somewhat scared of sleeping here.  She started off by sleeping in the day area but eventually would went to her room.  She denies various forms of delusions as well as hallucinations.  Denies irritability depression or anxiety.  She was offered mag citrate yesterday but she declined.  No side effects of medication.  Patient says that she did not end of speaking with family yesterday. Different plans going forward is that she wants to follow-up at [facility] after discharge instead of her current psychiatrist at [facility] in [facility]  12/20 Patient seen this morning after breakfast.  She denies depression, irritability, depression and anxiety.  She did sleep well.  Asks about discharge.  Says that she ate everything today for breakfast but had limited choices.  Says that she does not remember what she ordered.  Concerned about having a lack of clothing and hygiene products here on like home.  Asked why she cannot get money from her locker to buy Gatorade.  Wonders if she can just pay for her food here.  This was discussed prior that is included in her stay. Comments on feeling sleeping sedated through out the day.  Update: Patient approaches this provider several times.  Fixates on discharge again.  Also complains about dozing off to sleep  throughout the day.  It is documented that she is not sleeping but she asserts that this is not true.  She finally acknowledges that she does not feel safe in this environment to sleep.  "When can I go home?"  12/19 Objectively, patient has poor eye contact, she slouches when talking.  When asked about this, states that this is her normal self.  "I feel somewhat better.  My body feels better because the 8.  I eat good enough to really go home," she maintains.  Says that she usually does eat a lot at home, especially whole grains.  Says that she is eating less here because of this.  She denies any depression, irritability anxiety hallucinations or delusions.  Says that she really has not spoken to her husband over the phone but says that he is supportive.  I ask if I can call him and she agrees.  However, she would like to be in the room when I call.  I did call his number and left a message.  I will try him again tomorrow.  Says that she slept very well last night.  No new medical issues.  No side effects on her medications.  Update: I did speak to patient's husband this morning who indicates that she is sounding much better.  They spoke yesterday, and for the first time, she did not express any paranoia.  He states that she has done well on Zyprexa 2 years ago and was uncertain why was taken off.  She did really well on Fanapt as well but the insurance  would not pay for this medication.  On Risperdal, she experienced lactation issues.  He would like to see her completely off of Geodon before discharge can be considered.  States that her appetite has been poor for the past several months, this gets worse when she is usually upset.  Patient still maintains that she eats well at home husband indicates that she always feels like people are talking about her outside of her apartment.  He denies her experiencing hallucinations, as far as he knows.  He is thinking middle of the week.  12/18 Patient is being  seen by this provider for the first time.  Chart and records reviewed.  She was initially seen for psychiatric examination by Dr. Neale Burly on November 08, 2020.  Today, she reports feeling "regular."  She denies anxiety or depression.  She feels sad that she is here on the unit, misses her home.  She does report that her significant other place her in IVC but he meant well.  States that she did not need to be admitted.  Tolerating her medications well.  Reports indicate that she is not eating much but she reports that she is eating a little more than half of her meals.   She is sleeping well.  Denies paranoia.  No auditory or visual hallucinations.  No tactile or olfactory hallucinations.  He denies thought broadcasting or ideas of reference.  She was told that she is not being charged extra for water and food.  Tells me that she now believes this.  She complains of swelling in her feet.  Per nurse, she is eating a little better. Only slept 1 hour last night.   Labs: No new labs to review  Total Time spent with patient:  Past Psychiatric History: Multiple inpatient hospitilizations. Most recently in August 2021 at [facility] in [place] where she was hospitalized for 2 weeks. She sees Dr. Gilmore Laroche for outpatient management. She was most recently stabilized on Geodon 40 mg QHS, Lithium CR 900 mg QHS, and cogentin 1 mg QHS. Past medication trials including Seroquel, Abilify, Zyprexa, Saphris, Fanapt, Invega, Risperdal, Tegretol. Lamictal, and Trazodone. History of suicide attempt via overdose in 2014.   Past Medical History:  Past Medical History:  Diagnosis Date  . Anxiety   . Chronic bipolar disorder (HCC)   . Depression   . H/O suicide attempt 05-2013   OD  . Obesity   . OCD (obsessive compulsive disorder)   . Psychiatric diagnosis    History reviewed. No pertinent surgical history. Family History:  Family History  Problem Relation Age of Onset  . Hypertension Mother    . Depression Father   . Hypertension Father   . Bipolar disorder Father   . Hypertension Maternal Grandmother   . Obesity Maternal Grandmother   . Hypertension Paternal Grandmother    Family Psychiatric  History: Father with bipolar disorder  Social History:  Social History   Substance and Sexual Activity  Alcohol Use No     Social History   Substance and Sexual Activity  Drug Use No    Social History   Socioeconomic History  . Marital status: Married    Spouse name: [name]  . Number of children: 0  . Years of education: Not on file  . Highest education level: Not on file  Occupational History  . Occupation: unemployed  Tobacco Use  . Smoking status: Never Smoker  . Smokeless tobacco: Never Used  Vaping Use  . Vaping Use: Never  used  Substance and Sexual Activity  . Alcohol use: No  . Drug use: No  . Sexual activity: Yes    Birth control/protection: I.U.D.  Other Topics Concern  . Not on file  Social History Narrative   Lives with husband   Right handed   Drinks caffeine seldomly   Social Determinants of Health   Financial Resource Strain: Not on file  Food Insecurity: Not on file  Transportation Needs: Not on file  Physical Activity: Not on file  Stress: Not on file  Social Connections: Not on file   Additional Social History:                         Sleep: Poor  Appetite:  Fair  Current Medications: Current Facility-Administered Medications  Medication Dose Route Frequency Provider Last Rate Last Admin  . acetaminophen (TYLENOL) tablet 650 mg  650 mg Oral Q6H PRN Clapacs, John T, MD      . benztropine (COGENTIN) tablet 1 mg  1 mg Oral QHS Jesse Sans, MD   1 mg at 11/18/20 2059  . docusate sodium (COLACE) capsule 100 mg  100 mg Oral BID Clapacs, Jackquline Denmark, MD   100 mg at 11/18/20 0738  . feeding supplement (ENSURE ENLIVE / ENSURE PLUS) liquid 237 mL  237 mL Oral BID BM Jesse Sans, MD   237 mL at 11/18/20 1410  . lithium  carbonate (ESKALITH) CR tablet 900 mg  900 mg Oral QHS Clapacs, Jackquline Denmark, MD   900 mg at 11/18/20 2059  . magnesium citrate solution 1 Bottle  1 Bottle Oral Once Reggie Pile, MD      . magnesium hydroxide (MILK OF MAGNESIA) suspension 30 mL  30 mL Oral Daily PRN Clapacs, Jackquline Denmark, MD   30 mL at 11/17/20 1430  . mirtazapine (REMERON) tablet 7.5 mg  7.5 mg Oral QHS PRN Reggie Pile, MD      . multivitamin with minerals tablet 1 tablet  1 tablet Oral Daily Jesse Sans, MD   1 tablet at 11/18/20 [phone]  . OLANZapine (ZYPREXA) tablet 20 mg  20 mg Oral QHS Reggie Pile, MD   20 mg at 11/18/20 2059  . OLANZapine zydis (ZYPREXA) disintegrating tablet 10 mg  10 mg Oral BID PRN Clapacs, Jackquline Denmark, MD   10 mg at 11/10/20 1811  . simethicone (MYLICON) chewable tablet 80 mg  80 mg Oral QID PRN Jesse Sans, MD   80 mg at 11/17/20 1430  . ziprasidone (GEODON) injection 20 mg  20 mg Intramuscular Daily PRN Clapacs, Jackquline Denmark, MD        Lab Results:  No results found for this or any previous visit (from the past 48 hour(s)).  Blood Alcohol level:  Lab Results  Component Value Date   ETH <10 11/06/2020   ETH <10 06/30/2020    Metabolic Disorder Labs: Lab Results  Component Value Date   HGBA1C 4.9 11/09/2020   MPG 93.93 11/09/2020   MPG 100 09/03/2014   Lab Results  Component Value Date   PROLACTIN 34.1 (H) 03/11/2020   Lab Results  Component Value Date   CHOL 141 11/09/2020   TRIG 59 11/09/2020   HDL 45 11/09/2020   CHOLHDL 3.1 11/09/2020   VLDL 12 11/09/2020   LDLCALC 84 11/09/2020   LDLCALC 88 04/21/2020    Physical Findings: AIMS: Facial and Oral Movements Muscles of Facial Expression: None, normal Lips and Perioral Area:  None, normal Jaw: None, normal Tongue: None, normal,Extremity Movements Upper (arms, wrists, hands, fingers): None, normal Lower (legs, knees, ankles, toes): None, normal, Trunk Movements Neck, shoulders, hips: None, normal, Overall Severity Severity of abnormal  movements (highest score from questions above): None, normal Incapacitation due to abnormal movements: None, normal Patient's awareness of abnormal movements (rate only patient's report): No Awareness, Dental Status Current problems with teeth and/or dentures?: No Does patient usually wear dentures?: No  CIWA:    COWS:     Musculoskeletal: Strength & Muscle Tone: within normal limits Gait & Station: normal Patient leans: N/A  Psychiatric Specialty Exam:     Blood pressure 122/88, pulse (!) 122, temperature 98.7 F (37.1 C), temperature source Oral, resp. rate 18, height  (1.626 m), weight 75.8 kg, SpO2 98 %.Body mass index is 28.67 kg/m.  General Appearance: poor eye contact  Eye Contact:  Fair  Speech:  Normal Rate  Volume:  Decreased  Mood:  "fine."  Affect:  restricted  Thought Process:  Disorganized  Orientation:  Full (Time, Place, and Person)  Thought Content:  Illogical, Delusions and Paranoid Ideation  Suicidal Thoughts:  No  Homicidal Thoughts:  No  Memory:  Immediate;   Fair Recent;   Fair Remote;   NA  Judgement:  Impaired  Insight:  Lacking  Psychomotor Activity:  Restlessness  Concentration:  Concentration: Poor and Attention Span: Poor  Recall:  NA  Fund of Knowledge:  Fair  Language:  Fair  Akathisia:  No  Handed:  Right  AIMS (if indicated):     Assets:  Desire for Improvement Physical Health  ADL's:  Intact  Cognition:  Impaired,  Mild  Sleep:  Number of Hours: 5.75     Treatment Plan Summary: Daily contact with patient to assess and evaluate symptoms and progress in treatment and Medication management   Patient is a 29 year old female with the above-stated past psychiatric history who is seen in follow-up.  Chart reviewed. Patient discussed with nursing. Patient continues to express paranoid persecutory delusions; no aggressive behavior in the unit. Compliance with medications encouraged. Continue morning dose of antipsychotic (Geodon   QAM), decrease nighttime Geodon  HS, increase Zyprexa 15 mg at bedtime, continue Lithium  HS (lithium level 0.8). Patient has previously failed monotherapy with Seroquel, Geodon, Abilify, Zyprexa, Saphris, Fanapt, Invega, and Risperdal.  Plan: -continue inpatient psych admission; 15-minute checks; daily contact with patient to assess and evaluate symptoms and progress in treatment; psychoeducation.  -continue scheduled medications: increase nighttime Geodon . benztropine  1 mg Oral QHS  . docusate sodium  100 mg Oral BID  . feeding supplement  237 mL Oral BID BM  . lithium carbonate  900 mg Oral QHS  . magnesium citrate  1 Bottle Oral Once  . multivitamin with minerals  1 tablet Oral Daily  . OLANZapine  20 mg Oral QHS   -continue PRN medications.  acetaminophen, magnesium hydroxide, mirtazapine, OLANZapine zydis, simethicone, ziprasidone  -Pertinent Labs: lipid panel reviewed - WNL. Li level 12/08 - 0.4mmol/L.  -EKG: on 12/9 showed Qtc 417.    -Consults: No new consults placed since yesterday    -Disposition: patient is not ready for d/c yet. All necessary aftercare will be arranged prior to discharge Likely d/c home with outpatient psych follow-up.  -  I certify that the patient does need, on a daily basis, active treatment furnished directly by or requiring the supervision of inpatient psychiatric facility personnel.   12/18 Lithium level 1.05 on 12/15 Encouraged  to elevate her feet.  Monitor swelling. Increase Zyprexa to 20 mg at night tomorrow D/c date is unknown Will speak with team about her program and plans going forward.   12/19 Discontinue morning Geodon 20 mg starting tomorrow Tomorrow, discontinue evening Geodon Continue Zyprexa 20 mg Possible discharge middle of the week Spoke with patient's husband today  12/20 D/c geodon Possible d/c on Wednesday but will defer to primary attending Mag cit x 1 (update: pt refused) Add Remeron 7.5mg  PO q HS PRN  insomnia  12/21 No changes Sleep sheet indicates that she slept 5.75 hours Monitor HR. Check again in 2 hours. Patient attributes it to anxiety.   Reggie Pile, MD 11/19/2020, 6:48 AM

## 2020-11-19 NOTE — B[facility] Group Notes (Signed)
LCSW Group Therapy Note  11/19/2020 2:15 PM  Type of Therapy/Topic:  Group Therapy:  Feelings about Diagnosis  Participation Level:  Minimal   Description of Group:   This group will allow patients to explore their thoughts and feelings about diagnoses they have received. Patients will be guided to explore their level of understanding and acceptance of these diagnoses. Facilitator will encourage patients to process their thoughts and feelings about the reactions of others to their diagnosis and will guide patients in identifying ways to discuss their diagnosis with significant others in their lives. This group will be process-oriented, with patients participating in exploration of their own experiences, giving and receiving support, and processing challenge from other group members.   Therapeutic Goals: 1. Patient will demonstrate understanding of diagnosis as evidenced by identifying two or more symptoms of the disorder 2. Patient will be able to express two feelings regarding the diagnosis 3. Patient will demonstrate their ability to communicate their needs through discussion and/or role play  Summary of Patient Progress: Patient was present for part of the group as she was called out during the middle for a phone call. She participated sparingly but her comments were pertinent to the discussion. Patient was able to identify with feelings of others around not wanting to be labeled. Patient did nod out throughout the time that she was present in the group. However, after group patient approached CSW and was apologetic about falling asleep during group, stating that she was being tapered off of Geodon which makes her sleepy.  Therapeutic Modalities:   Cognitive Behavioral Therapy Brief Therapy Feelings Identification   Carlos Quackenbush R. Algis Greenhouse, MSW, LCSW, LCAS 11/19/2020 2:15 PM

## 2020-11-20 MED ORDER — OLANZAPINE 20 MG PO TABS: 20.0000 mg | ORAL_TABLET | Freq: Every day | ORAL | 1 refills | Status: DC

## 2020-11-20 MED ORDER — MIRTAZAPINE 7.5 MG PO TABS: 7.5000 mg | ORAL_TABLET | Freq: Every evening | ORAL | 1 refills | Status: AC | PRN

## 2020-11-20 MED ORDER — LITHIUM CARBONATE ER 450 MG PO TBCR: 900.0000 mg | EXTENDED_RELEASE_TABLET | Freq: Every day | ORAL | 1 refills | Status: DC

## 2020-11-20 MED ORDER — BENZTROPINE MESYLATE 1 MG PO TABS: 1.0000 mg | ORAL_TABLET | Freq: Every day | ORAL | 1 refills | Status: AC

## 2020-11-20 NOTE — B[facility] Suicide Risk Assessment (Signed)
[facility] Discharge Suicide Risk Assessment   Principal Problem: Bipolar affective disorder, current episode manic with psychotic symptoms [facility]) Discharge Diagnoses: Principal Problem:   Bipolar affective disorder, current episode manic with psychotic symptoms (HCC) Active Problems:   OCD (obsessive compulsive disorder)   Total Time spent with patient: 30 minutes  Musculoskeletal: Strength & Muscle Tone: within normal limits Gait & Station: normal Patient leans: N/A  Psychiatric Specialty Exam: Review of Systems  Constitutional: Negative for appetite change and fatigue.  HENT: Negative for rhinorrhea and sore throat.   Eyes: Negative for photophobia and visual disturbance.  Respiratory: Negative for cough and shortness of breath.   Cardiovascular: Negative for chest pain and palpitations.  Gastrointestinal: Negative for constipation, diarrhea, nausea and vomiting.  Endocrine: Negative for cold intolerance and heat intolerance.  Genitourinary: Negative for difficulty urinating and dysuria.  Musculoskeletal: Negative for arthralgias and myalgias.  Skin: Negative for rash and wound.  Allergic/Immunologic: Negative for environmental allergies and food allergies.  Neurological: Negative for dizziness and headaches.  Hematological: Negative for adenopathy. Does not bruise/bleed easily.  Psychiatric/Behavioral: Negative for hallucinations and suicidal ideas.    Blood pressure 130/84, pulse (!) 111, temperature 98.8 F (37.1 C), temperature source Oral, resp. rate 17, height 5\' 4"  (1.626 m), weight 75.8 kg, SpO2 97 %.Body mass index is 28.67 kg/m.  General Appearance: Fairly Groomed  ::  Good  Speech:  Clear and Coherent and Normal Rate  Volume:  Normal  Mood:  Euthymic  Affect:  Congruent  Thought Process:  Coherent and Linear  Orientation:  Full (Time, Place, and Person)  Thought Content:  Logical  Suicidal Thoughts:  No  Homicidal Thoughts:  No  Memory:  Immediate;    Fair Recent;   Fair Remote;   Fair  Judgement:  Intact  Insight:  Fair  Psychomotor Activity:  Normal  Concentration:  Good  Recall:  Good  Fund of Knowledge:Fair  Language: Good  Akathisia:  Negative  Handed:  Right  AIMS (if indicated):     Assets:  Communication Skills Desire for Improvement Financial Resources/Insurance Housing Intimacy Physical Health Resilience Social Support Transportation  Sleep:  Number of Hours: 4.75  Cognition: WNL  ADL's:  Intact   Mental Status Per Nursing Assessment::   On Admission:  NA  Demographic Factors:  NA  Loss Factors: NA  Historical Factors: Impulsivity  Risk Reduction Factors:   Sense of responsibility to family, Religious beliefs about death, Living with another person, especially a relative, Positive social support, Positive therapeutic relationship and Positive coping skills or problem solving skills  Continued Clinical Symptoms:  Depression:   Recent sense of peace/wellbeing Previous Psychiatric Diagnoses and Treatments  Cognitive Features That Contribute To Risk:  None    Suicide Risk:  Minimal: No identifiable suicidal ideation.  Patients presenting with no risk factors but with morbid ruminations; may be classified as minimal risk based on the severity of the depressive symptoms   Follow-up Information    [facility] Follow up.   Contact information: [address]  [address] [place] [phone]               Plan Of Care/Follow-up recommendations:  Activity:  as tolerated Diet:  regular diet  [phone], MD 11/20/2020, 10:00 AM

## 2020-11-20 NOTE — Progress Notes (Signed)
Recreation Therapy Notes  INPATIENT RECREATION TR PLAN  Patient Details Name: Natalie [name] MRN: [id number] DOB: May 31, 1991 Today's Date: 11/20/2020  Rec Therapy Plan Is patient appropriate for Therapeutic Recreation?: Yes Treatment times per week: at least 3 Estimated Length of Stay: 5-7 days TR Treatment/Interventions: Group participation (Comment)  Discharge Criteria Pt will be discharged from therapy if:: Discharged Treatment plan/goals/alternatives discussed and agreed upon by:: Patient/family  Discharge Summary Short term goals set: Patient will identify 3 positive coping skills strategies to use post d/c within 5 recreation therapy group sessions Short term goals met: Complete Progress toward goals comments: Groups attended Which groups?: Self-esteem,Stress management,Communication,Other (Comment) (Time management, Emotions, Self-care, Happiness) Reason goals not met: N/A Therapeutic equipment acquired: N/A Reason patient discharged from therapy: Discharge from hospital Pt/family agrees with progress & goals achieved: Yes Date patient discharged from therapy: 11/20/20   Racquelle Hyser 11/20/2020, 12:00 PM

## 2020-11-20 NOTE — Progress Notes (Signed)
D: Pt alert and oriented. Pt denies experiencing any pain, SI/HI, or AVH at this time. Pt reports she will be able to keep herself safe when she returns home.   A: Pt received discharge and medication education/information. Pt belongings were returned and signed for at this time to include pt's money from the safe.   R: Pt verbalized understanding of discharge and medication education/information.  Pt escorted by staff to medical mall front lobby where pt's family picked her up.

## 2020-11-20 NOTE — Discharge Summary (Signed)
Physician Discharge Summary Note  Patient:  Natalie [name] is an 29 y.o., female MRN:  [id number] DOB:  04/24/1991 Patient phone:  [phone] (home)  Patient address:   [address] [place] [address],  Total Time spent with patient: 30 minutes  Date of Admission:  11/08/2020 Date of Discharge: 11/20/2020  Reason for Admission:   Natalie [name] is a 29 year old female with history of bipolar affective disorder, OCD, and GAD who presented to outside hospital with IVC papers in place. Per IVC paperwork, patient off her medications, responding to internal stimuli, speaking to others that were not there, and fearing that someone is trying to rape her.   Principal Problem: Bipolar affective disorder, current episode manic with psychotic symptoms [facility]) Discharge Diagnoses: Principal Problem:   Bipolar affective disorder, current episode manic with psychotic symptoms (HCC) Active Problems:   OCD (obsessive compulsive disorder)   Past Psychiatric History: Multiple inpatient hospitilizations. Most recently in August 2021 at [facility] in [place] where she was hospitalized for 2 weeks. She sees Dr. Gilmore Laroche for outpatient management. She was most recently stabilized on Geodon 40 mg QHS, Lithium CR 900 mg QHS, and cogentin 1 mg QHS. Past medication trials including Seroquel, Abilify, Zyprexa, Saphris, Fanapt, Invega, Risperdal, Tegretol. Lamictal, and Trazodone. History of suicide attempt via overdose in 2014.   Past Medical History:  Past Medical History:  Diagnosis Date  . Anxiety   . Chronic bipolar disorder (HCC)   . Depression   . H/O suicide attempt 05-2013   OD  . Obesity   . OCD (obsessive compulsive disorder)   . Psychiatric diagnosis    History reviewed. No pertinent surgical history. Family History:  Family History  Problem Relation Age of Onset  . Hypertension Mother   . Depression Father   . Hypertension Father   . Bipolar disorder Father   .  Hypertension Maternal Grandmother   . Obesity Maternal Grandmother   . Hypertension Paternal Grandmother    Family Psychiatric  History: Father with bipolar disorder Social History:  Social History   Substance and Sexual Activity  Alcohol Use No     Social History   Substance and Sexual Activity  Drug Use No    Social History   Socioeconomic History  . Marital status: Married    Spouse name: [name]  . Number of children: 0  . Years of education: Not on file  . Highest education level: Not on file  Occupational History  . Occupation: unemployed  Tobacco Use  . Smoking status: Never Smoker  . Smokeless tobacco: Never Used  Vaping Use  . Vaping Use: Never used  Substance and Sexual Activity  . Alcohol use: No  . Drug use: No  . Sexual activity: Yes    Birth control/protection: I.U.D.  Other Topics Concern  . Not on file  Social History Narrative   Lives with husband   Right handed   Drinks caffeine seldomly   Social Determinants of Health   Financial Resource Strain: Not on file  Food Insecurity: Not on file  Transportation Needs: Not on file  Physical Activity: Not on file  Stress: Not on file  Social Connections: Not on file    Hospital Course:  Natalie [name] is a 29 year old female with history of bipolar affective disorder, OCD, and GAD who presented to outside hospital with IVC papers in place. Per IVC paperwork, patient off her medications, responding to internal stimuli, speaking to others that were not there, and fearing that  someone is trying to rape her. On admission patient was standing in her room mumbling to herself, guarded, and only sleeping for 1 hour per night. She was restarted on Lithium 900 mg QHS with level 1.05, cogentin 1 mg QHS, and Geodon 40 mg QHS. Geodon was increased to 20 mg with breakfast, 60 mg at night without improvement. She was cross-titrated to Zyprexa and she was able to sleep more, and began coming to groups. Remeron 7.5 mg  QHS was also added to assist with sleep. At time of discharge patient sleeping 4-5 hours, and eating small portions of all three meals. Her husband was contacted prior to discharge, and he felt she was back to baseline. She had not expressed any paranoid ideations to him on the phone, and he felt Zyprexa was more helpful than Geodon. He did state that Fanapt worked the best for her, but they were unable to afford medication. Patient did not require restraints or seclusion during hospital stay. At time of discharge she denied suicidal ideations, homicidal ideations, auditory hallucinations, and visual hallucinations. Patient, patient's husband, and treatment team felt she was safe to discharge with outpatient follow-up.   Physical Findings: AIMS: Facial and Oral Movements Muscles of Facial Expression: None, normal Lips and Perioral Area: None, normal Jaw: None, normal Tongue: None, normal,Extremity Movements Upper (arms, wrists, hands, fingers): None, normal Lower (legs, knees, ankles, toes): None, normal, Trunk Movements Neck, shoulders, hips: None, normal, Overall Severity Severity of abnormal movements (highest score from questions above): None, normal Incapacitation due to abnormal movements: None, normal Patient's awareness of abnormal movements (rate only patient's report): No Awareness, Dental Status Current problems with teeth and/or dentures?: No Does patient usually wear dentures?: No  CIWA:    COWS:     Musculoskeletal: Strength & Muscle Tone: within normal limits Gait & Station: normal Patient leans: N/A  Psychiatric Specialty Exam: Physical Exam Vitals and nursing note reviewed.  Constitutional:      Appearance: Normal appearance.  HENT:     Head: Normocephalic and atraumatic.     Right Ear: External ear normal.     Left Ear: External ear normal.     Nose: Nose normal.     Mouth/Throat:     Mouth: Mucous membranes are moist.     Pharynx: Oropharynx is clear.  Eyes:      Extraocular Movements: Extraocular movements intact.     Conjunctiva/sclera: Conjunctivae normal.     Pupils: Pupils are equal, round, and reactive to light.  Cardiovascular:     Rate and Rhythm: Normal rate.     Pulses: Normal pulses.  Pulmonary:     Effort: Pulmonary effort is normal.     Breath sounds: Normal breath sounds.  Abdominal:     General: Abdomen is flat.     Palpations: Abdomen is soft.  Musculoskeletal:        General: No swelling. Normal range of motion.     Cervical back: Normal range of motion and neck supple.  Skin:    General: Skin is warm and dry.  Neurological:     General: No focal deficit present.     Mental Status: She is alert and oriented to person, place, and time.  Psychiatric:        Mood and Affect: Mood normal.        Behavior: Behavior normal.        Thought Content: Thought content normal.        Judgment: Judgment normal.  Review of Systems  Constitutional: Negative for appetite change and fatigue.  HENT: Negative for rhinorrhea and sore throat.   Eyes: Negative for photophobia and visual disturbance.  Respiratory: Negative for cough and shortness of breath.   Cardiovascular: Negative for chest pain and palpitations.  Gastrointestinal: Negative for constipation, diarrhea, nausea and vomiting.  Endocrine: Negative for cold intolerance and heat intolerance.  Genitourinary: Negative for difficulty urinating and dysuria.  Musculoskeletal: Negative for arthralgias and myalgias.  Skin: Negative for rash and wound.  Allergic/Immunologic: Negative for environmental allergies and food allergies.  Neurological: Negative for dizziness and headaches.  Hematological: Negative for adenopathy. Does not bruise/bleed easily.  Psychiatric/Behavioral: Negative for hallucinations and suicidal ideas.    Blood pressure 130/84, pulse (!) 111, temperature 98.8 F (37.1 C), temperature source Oral, resp. rate 17, height  (1.626 m), weight 75.8 kg, SpO2  97 %.Body mass index is 28.67 kg/m.  General Appearance: Fairly Groomed  Patent attorney::  Good  Speech:  Clear and Coherent and Normal Rate  Volume:  Normal  Mood:  Euthymic  Affect:  Congruent  Thought Process:  Coherent and Linear  Orientation:  Full (Time, Place, and Person)  Thought Content:  Logical  Suicidal Thoughts:  No  Homicidal Thoughts:  No  Memory:  Immediate;   Fair Recent;   Fair Remote;   Fair  Judgement:  Intact  Insight:  Fair  Psychomotor Activity:  Normal  Concentration:  Good  Recall:  Good  Fund of Knowledge:Fair  Language: Good  Akathisia:  Negative  Handed:  Right  AIMS (if indicated):     Assets:  Communication Skills Desire for Improvement Financial Resources/Insurance Housing Intimacy Physical Health Resilience Social Support Transportation  Sleep:  Number of Hours: 4.75  Cognition: WNL  ADL's:  Intact        Have you used any form of tobacco in the last 30 days? (Cigarettes, Smokeless Tobacco, Cigars, and/or Pipes): No  Has this patient used any form of tobacco in the last 30 days? (Cigarettes, Smokeless Tobacco, Cigars, and/or Pipes) No  Blood Alcohol level:  Lab Results  Component Value Date   ETH <10 11/06/2020   ETH <10 06/30/2020    Metabolic Disorder Labs:  Lab Results  Component Value Date   HGBA1C 4.9 11/09/2020   MPG 93.93 11/09/2020   MPG 100 09/03/2014   Lab Results  Component Value Date   PROLACTIN 34.1 (H) 03/11/2020   Lab Results  Component Value Date   CHOL 141 11/09/2020   TRIG 59 11/09/2020   HDL 45 11/09/2020   CHOLHDL 3.1 11/09/2020   VLDL 12 11/09/2020   LDLCALC 84 11/09/2020   LDLCALC 88 04/21/2020    See Psychiatric Specialty Exam and Suicide Risk Assessment completed by Attending Physician prior to discharge.  Discharge destination:  Home  Is patient on multiple antipsychotic therapies at discharge:  No   Has Patient had three or more failed trials of antipsychotic monotherapy by history:   No  Recommended Plan for Multiple Antipsychotic Therapies: NA  Discharge Instructions    Diet general   Complete by: As directed    Increase activity slowly   Complete by: As directed      Allergies as of 11/20/2020   No Known Allergies     Medication List    STOP taking these medications   senna-docusate 8.6-50 MG tablet Commonly known as: Senokot-S   ziprasidone 40 MG capsule Commonly known as: GEODON     TAKE these medications  Indication  benztropine 1 MG tablet Commonly known as: COGENTIN Take 1 tablet (1 mg total) by mouth at bedtime. What changed:   medication strength  how much to take  when to take this  reasons to take this  Indication: Extrapyramidal Reaction caused by Medications   docusate sodium 100 MG capsule Commonly known as: COLACE Take 1 capsule (100 mg total) by mouth 2 (two) times daily. For constipation  Indication: Constipation   lithium carbonate 450 MG CR tablet Commonly known as: ESKALITH Take 2 tablets (900 mg total) by mouth at bedtime.  Indication: Manic-Depression, Mood stabilization   mirtazapine 7.5 MG tablet Commonly known as: REMERON Take 1 tablet (7.5 mg total) by mouth at bedtime as needed (insomnia).  Indication: Major Depressive Disorder   OLANZapine 20 MG tablet Commonly known as: ZYPREXA Take 1 tablet (20 mg total) by mouth at bedtime.  Indication: Depressive Phase of Manic-Depression, MIXED BIPOLAR AFFECTIVE DISORDER   senna 8.6 MG Tabs tablet Commonly known as: SENOKOT Take every other day after iron What changed:   how much to take  how to take this  when to take this  reasons to take this  additional instructions  Indication: Constipation       Follow-up Information    [facility] Follow up.   Contact information: [address]  [address] [place] [address] [phone]               Follow-up recommendations:  Activity:  as tolerated Diet:  regular diet  Comments:   30-day scripts with 1 refill sent to [facility] [address] in [place], [place] per patient request  Signed: Jesse Sans, MD 11/20/2020, 10:03 AM

## 2020-11-20 NOTE — Progress Notes (Signed)
Recreation Therapy Notes  Date: 11/20/2020  Time: 9:30 am   Location: Craft room   Behavioral response: Appropriate  Intervention Topic: Time management   Discussion/Intervention:  Group content today was focused on time management. The group defined time management and identified healthy ways to manage time. Individuals expressed how much of the 24 hours they use in a day. Patients expressed how much time they use just for themselves personally. The group expressed how they have managed their time in the past. Individuals participated in the intervention "Managing Life" where they had a chance to see how much of the 24 hours they use and where it goes.  Clinical Observations/Feedback: Patient came to group and was focused on what peers and staff had to say about time management. Individual was social with peers and staff while participating in the intervention.  Damarri Rampy LRT/CTRS         Gar Glance 11/20/2020 11:22 AM

## 2020-11-20 NOTE — Progress Notes (Signed)
  [facility] Adult Case Management Discharge Plan :  Will you be returning to the same living situation after discharge:  Yes,  Patient to return to place of residence.  At discharge, do you have transportation home?: Yes,  Patient's mother to assist with transportation.  Do you have the ability to pay for your medications: Yes,  [facility].  Release of information consent forms completed and in the chart;  Patient's signature needed at discharge.  Patient to Follow up at:  Follow-up Information    [facility] Follow up.   Why: Vesta Mixer will contact you at [phone] in order to schedule an appointment. Please check your email and voicmails.  Contact information: [address]  [address] [place] [address] [phone]               Next level of care provider has access to [facility] Link:no  Safety Planning and Suicide Prevention discussed: Yes,  SPE completed with patient and collateral contact.   Have you used any form of tobacco in the last 30 days? (Cigarettes, Smokeless Tobacco, Cigars, and/or Pipes): No  Has patient been referred to the Quitline?: Patient refused referral  Patient has been referred for addiction treatment: N/A  Corky Crafts, LCSWA 11/20/2020, 10:09 AM

## 2020-11-20 NOTE — Progress Notes (Signed)
D: Pt alert and oriented. Pt rates depression 4/10, hopelessness 0/10, and anxiety 0/10. Pt goal: "Discharge." Pt reports energy level as low and concentration as being good. Pt reports sleep last night as being good. Pt did receive medications for sleep. Pt denies experiencing any pain at this time. Pt denies experiencing any SI/HI, or AVH at this time.   A: Scheduled medications administered to pt, per MD orders. Support and encouragement provided. Frequent verbal contact made. Routine safety checks conducted q15 minutes.   R: No adverse drug reactions noted. Pt verbally contracts for safety at this time. Pt complaint with medications. Pt interacts well, however minimally with others on the unit. Pt remains safe at this time. Will continue to monitor.

## 2020-11-21 ENCOUNTER — Telehealth: Payer: Self-pay

## 2020-11-21 NOTE — Telephone Encounter (Signed)
Patient calls nurse line regarding abnormal vaginal bleeding. Patient reports that this has been ongoing since restarting depo injections.   Patient states that she started having heavy menses approx 4 months after starting depo, lasting "a long time". Patient reports that she has been bleeding for "a couple of months". States that bleeding fluctuates in amount and frequency. Patient is currently asymptomatic.   Advised patient that she should discuss these concerns in more detail with provider. Patient is requesting returned phone call from provider. Informed patient that provider is out of the office currently and I would relay this message.   Return precautions given.   Veronda Prude, RN

## 2020-11-21 NOTE — Telephone Encounter (Signed)
Called patient about questions.  She continues to have stopping with Depo, she is 2.5 months from last injection. Scheduled with Dr. Leary Roca for pelvic examination at closest available (1/6 per patient's request given her schedule), consider COC (no contraindications that I identified but would confirm).  Scheduled with PCP 1/17.  All questions answered. Discussed some options with her over the phone. She will call with any further concerns.  [name], MD  Family Medicine Teaching Service

## 2020-12-04 NOTE — Progress Notes (Signed)
    SUBJECTIVE:   CHIEF COMPLAINT / HPI:   Menorrhagia: Patient has been on depo and has continued unscheduled bleeding, she reports that she was frequently bleeding through pads about 1 month ago and having her clothes ruined while out running errands. Her most recent period was normal and the bleeding she reported has now subsided. Her last injection was 09/26/20, patient is currently in window for repeat depo injection. We discussed options for birth control and also a pelvic exam. Patient was interested in the patch, but is worried about getting enough prescriptions as she finds it difficult to get to the pharmacy every 3 weeks due to transportation issues. She elects to receive the depo injection again, she declines having a pelvic exam today.  PERTINENT  PMH / PSH: BPD, menorrhagia  OBJECTIVE:   BP 100/60   Pulse (!) 105   Ht 5\' 4"  (1.626 m)   Wt 174 lb 4 oz (79 kg)   LMP  (LMP Unknown)   SpO2 98%   BMI 29.91 kg/m   Nursing note and vitals reviewed GEN: young, AAW, resting comfortably in chair, NAD, WNWD HEENT: NCAT. Sclera without injection or icterus. MMM.  Cardiac: Regular rate and rhythm. Normal S1/S2. No murmurs, rubs, or gallops appreciated. 2+ radial pulses. Lungs: Clear bilaterally to ascultation. No increased WOB, no accessory muscle usage. No w/r/r. Neuro: alert and at baseline Ext: no edema Psych: Pleasant and appropriate, somewhat blunted affect  ASSESSMENT/PLAN:   No problem-specific Assessment & Plan notes found for this encounter.     Shirlean Mylar, MD [facility]

## 2020-12-05 ENCOUNTER — Encounter: Payer: Self-pay | Admitting: Family Medicine

## 2020-12-05 ENCOUNTER — Ambulatory Visit (INDEPENDENT_AMBULATORY_CARE_PROVIDER_SITE_OTHER): Payer: Medicaid Other | Admitting: Family Medicine

## 2020-12-05 ENCOUNTER — Other Ambulatory Visit: Payer: Self-pay

## 2020-12-05 VITALS — BP 100/60 | HR 105 | Ht 64.0 in | Wt 174.2 lb

## 2020-12-05 DIAGNOSIS — Z3042 Encounter for surveillance of injectable contraceptive: Secondary | ICD-10-CM | POA: Diagnosis not present

## 2020-12-05 DIAGNOSIS — N920 Excessive and frequent menstruation with regular cycle: Secondary | ICD-10-CM | POA: Diagnosis not present

## 2020-12-05 MED ORDER — MEDROXYPROGESTERONE ACETATE 150 MG/ML IM SUSP
150.0000 mg | Freq: Once | INTRAMUSCULAR | Status: AC
  Administered 2020-12-05: 150 mg via INTRAMUSCULAR

## 2020-12-05 NOTE — Addendum Note (Signed)
Addended by: Aquilla Solian on: 12/05/2020 02:10 PM   Modules accepted: Orders

## 2020-12-05 NOTE — Assessment & Plan Note (Signed)
Patient has unscheduled bleeding with depo provera. This has improved recently. COC with estrogen is an option for treatment; no contraindications as age <31yo, BP <130/80, non-smoker. Patient does not want to use OCPs as a preference, is not interested in ring or IUD at this time do to insertion, she is interested in the patch, but concerned at this time that she would not be able to get to pharmacy monthly with transportation problems. Perhaps a pharmacy delivery service or mail-order pharmacy may be an option in the future. After discussing all options, patient wishes to repeat depo-provera. Will have patient follow up with PCP, Dr. Manson Passey.

## 2020-12-05 NOTE — Patient Instructions (Signed)
It was a pleasure to see you today!  1. Your next depo shot will be in 3 months. If you have problems with bleeding, please let us know, and we can consider pills or patch or any other method you are interested in.  2. Make a follow up appointment with Dr. Manson Passey in the next week.   Be Well,  Dr. Leary Roca

## 2020-12-16 ENCOUNTER — Ambulatory Visit: Payer: Medicaid Other | Admitting: Family Medicine

## 2020-12-23 ENCOUNTER — Telehealth: Payer: Self-pay

## 2020-12-23 NOTE — Telephone Encounter (Signed)
Patient calls nurse line reporting 10/10 muscle pain all over. Patient denies fever. Patient reports she has discussed this with PCP in the past and has been taking Tylenol. Patient reports she feels Tylenol is no longer working for her and would like some alternatives. Please advise. Will forward to PCP.

## 2020-12-23 NOTE — Telephone Encounter (Signed)
Patient returns call to nurse line. Informed of below. Patient does not want to try topical diclofenac and is requesting that Dr. Manson Passey return phone call to discuss further.   Veronda Prude, RN

## 2020-12-23 NOTE — Telephone Encounter (Signed)
Called and left voicemail to call back  If calls back please advise patient - Ice to worst area for 10 minutes - Massage area twice per day - Can try topical diclofenac which I am happy to send in.  Let me know if issues/questions.   [name], MD  Family Medicine Teaching Service

## 2020-12-23 NOTE — Telephone Encounter (Signed)
Called and discussed at length--agreeable to taking pregnancy test (late menses), considering PT, gel, heat.   Will call back with results and discussion with Psychiatry/med changes.  [name], MD  Family Medicine Teaching Service

## 2020-12-26 ENCOUNTER — Other Ambulatory Visit: Payer: Self-pay | Admitting: Family Medicine

## 2020-12-26 DIAGNOSIS — G894 Chronic pain syndrome: Secondary | ICD-10-CM

## 2020-12-26 NOTE — Progress Notes (Signed)
Received message from Patient's husband in his charge:  Hey Dr. Manson Passey!  My wife has found a specialist,  [facility]. They said that they need a referral only and she will start the process of healing her. I think God for this and you, our doctor! The number of the office is [phone]. The address is, [address]  [place] [address]. Please let us know about the referral process. Have a great day!Hey Dr. Manson Passey!  Referral placed---nursing can you please send referral?  Will respond to husband. [name], MD  Family Medicine Teaching Service

## 2021-01-06 ENCOUNTER — Other Ambulatory Visit: Payer: Self-pay

## 2021-01-06 ENCOUNTER — Encounter: Payer: Self-pay | Admitting: Family Medicine

## 2021-01-06 ENCOUNTER — Telehealth (INDEPENDENT_AMBULATORY_CARE_PROVIDER_SITE_OTHER): Payer: Medicaid Other | Admitting: Family Medicine

## 2021-01-06 DIAGNOSIS — Z659 Problem related to unspecified psychosocial circumstances: Secondary | ICD-10-CM | POA: Diagnosis not present

## 2021-01-06 NOTE — Progress Notes (Signed)
[facility] Telemedicine Visit  Patient consented to have virtual visit and was identified by name and date of birth. Method of visit: Telephone  Encounter participants: Patient: Natalie [name] - located at home Provider: Westley Chandler - located at office Others (if applicable): PJ (portion of visit alone)   Chief Complaint: Discuss medications  HPI:  Ms. Kovatch is a very pleasant 30 year old woman with history significant for mood disorder difficulty with contraceptive devices presenting today for routine follow-up.  Her and her husband struggle with transportation and thus this visit was transformed to virtual.  The patient reports she is taking all of her medications as prescribed.  These were reviewed today.  She is seeing a new therapist at [facility] her name is Ms. Lanora Manis.  She has a psychiatry appointment coming up next week on 15 February.  She denies low mood thoughts of hurting herself or others.  She is feeling very in control of her bipolar disorder today.  The patient reports her pain is very well controlled.  She does have a referral into [facility] and they have a referral.  She is awaiting an appointment.  She thinks that her attitude has a lot to do with her pain control.  She reports her pain is very well controlled today  The patient and her husband are moving to a new apartment in [place] in April.  The patient reports that her menstrual cycle has normalized and she has not had her menses in about a month.  She denies signs or symptoms of pregnancy.  She last had a Depo injection in early January. ROS: per HPI  Pertinent PMHx: Mood disorder, obesity, irregular menses  Exam:  Appropriate, alert, speaking in full sentences   Assessment/Plan:  Social Stressors Patient and husband both need follow-up for chronic ongoing medical conditions.  Referral sent to chronic care management to aid with transportation.  Encounter for  contraception discussion patient is very pleased with Depo at this time only we will continue this.  Time spent during visit with patient: 11 minutes

## 2021-01-08 ENCOUNTER — Telehealth: Payer: Medicaid Other

## 2021-01-10 ENCOUNTER — Telehealth: Payer: Self-pay | Admitting: Family Medicine

## 2021-01-10 NOTE — Telephone Encounter (Signed)
° °  Telephone encounter was:  Successful.  01/10/2021 Name: Natalie [name] MRN: [id number] DOB: 05/24/1991  Natalie [name] is a 30 y.o. year old female who is a primary care patient of Westley Chandler, MD . The community resource team was consulted for assistance with Transportation Needs   Care guide performed the following interventions: Patient provided with information about care guide support team and interviewed to confirm resource needs Investigation of community resources performed Discussed resources to assist with Medicaid transportation. Gave her the number to [facility] and also sent her an email with the transportation information as well. .  Follow Up Plan:  No further follow up planned at this time. The patient has been provided with needed resources.  Rojelio Brenner Care Guide, Embedded Care Coordination [facility], Care Management Phone: [phone] Email: [email or web address]

## 2021-01-14 ENCOUNTER — Telehealth: Payer: Self-pay | Admitting: Family Medicine

## 2021-01-14 DIAGNOSIS — F3181 Bipolar II disorder: Secondary | ICD-10-CM

## 2021-01-14 DIAGNOSIS — Z659 Problem related to unspecified psychosocial circumstances: Secondary | ICD-10-CM

## 2021-01-14 NOTE — Telephone Encounter (Signed)
Received note from [employer] that patient needed transportation to appointments.   [name], MD  Family Medicine Teaching Service

## 2021-02-06 ENCOUNTER — Other Ambulatory Visit: Payer: Self-pay | Admitting: Physician Assistant

## 2021-02-06 ENCOUNTER — Ambulatory Visit
Admission: RE | Admit: 2021-02-06 | Discharge: 2021-02-06 | Disposition: A | Discharge status: Home / Self Care | Payer: Medicaid Other | Source: Ambulatory Visit | Attending: Physician Assistant | Admitting: Physician Assistant

## 2021-02-06 DIAGNOSIS — R52 Pain, unspecified: Secondary | ICD-10-CM

## 2021-03-05 ENCOUNTER — Telehealth: Payer: Self-pay

## 2021-03-05 NOTE — Telephone Encounter (Signed)
Scheduled patient for 5/6 with PCP during previous phone encounter.   Veronda Prude, RN

## 2021-03-05 NOTE — Telephone Encounter (Signed)
Please call and help patient schedule with me sometime in May.   [name], MD  Family Medicine Teaching Service

## 2021-03-05 NOTE — Telephone Encounter (Signed)
Patient calls nurse line requesting to cancel nurse visit for depo tomorrow and reschedule with provider for beginning of May.  Advised patient that depo dates were ending tomorrow, therefore, she will no longer be protected against pregnancy if she waits to get depo until May.  Patient verbalizes understanding and would like to schedule with Dr. Manson Passey for follow up and to further discuss depo.   FYI to PCP  Veronda Prude, RN

## 2021-03-06 ENCOUNTER — Ambulatory Visit: Payer: Medicaid Other

## 2021-03-31 ENCOUNTER — Telehealth: Payer: Self-pay | Admitting: Family Medicine

## 2021-03-31 NOTE — Telephone Encounter (Signed)
Called and left generic voicemail to call back--please let family and patient know I will return on Wednesday and attempt to reach them again. If her symptoms change, please schedule her an appointment in the interim.  [name], MD  Family Medicine Teaching Service

## 2021-03-31 NOTE — Telephone Encounter (Signed)
Spoke with patients husband who reports swelling in her hands, knees, and feet. He reports pain when she walks. He denies any SOB or trauma. Patient has an apt on 5/6 with PCP. He is requesting a phone call from PCP before then. Will forward.

## 2021-03-31 NOTE — Telephone Encounter (Signed)
Patients husband is calling and would like for Dr. Manson Passey to call patient to discuss fluid in her legs. Patient has an appointment on 05/06 but still feels like she should speak with her before then.   Please call back to discuss [phone].

## 2021-04-02 ENCOUNTER — Ambulatory Visit (INDEPENDENT_AMBULATORY_CARE_PROVIDER_SITE_OTHER): Payer: Medicaid Other | Admitting: Family Medicine

## 2021-04-02 ENCOUNTER — Other Ambulatory Visit: Payer: Self-pay

## 2021-04-02 ENCOUNTER — Telehealth: Payer: Self-pay | Admitting: Family Medicine

## 2021-04-02 DIAGNOSIS — M25562 Pain in left knee: Secondary | ICD-10-CM

## 2021-04-02 DIAGNOSIS — G8929 Other chronic pain: Secondary | ICD-10-CM | POA: Diagnosis not present

## 2021-04-02 DIAGNOSIS — M1710 Unilateral primary osteoarthritis, unspecified knee: Secondary | ICD-10-CM | POA: Insufficient documentation

## 2021-04-02 DIAGNOSIS — Z30013 Encounter for initial prescription of injectable contraceptive: Secondary | ICD-10-CM | POA: Diagnosis not present

## 2021-04-02 DIAGNOSIS — M25561 Pain in right knee: Secondary | ICD-10-CM | POA: Diagnosis not present

## 2021-04-02 DIAGNOSIS — Z3042 Encounter for surveillance of injectable contraceptive: Secondary | ICD-10-CM | POA: Diagnosis not present

## 2021-04-02 DIAGNOSIS — Z79899 Other long term (current) drug therapy: Secondary | ICD-10-CM | POA: Diagnosis not present

## 2021-04-02 DIAGNOSIS — M130 Polyarthritis, unspecified: Secondary | ICD-10-CM | POA: Insufficient documentation

## 2021-04-02 MED ORDER — MEDROXYPROGESTERONE ACETATE 150 MG/ML IM SUSP
150.0000 mg | Freq: Once | INTRAMUSCULAR | Status: AC
  Administered 2021-04-02: 150 mg via INTRAMUSCULAR

## 2021-04-02 MED ORDER — MEDROXYPROGESTERONE ACETATE 150 MG/ML IM SUSP: 150.0000 mg | Freq: Once | INTRAMUSCULAR | Status: DC

## 2021-04-02 NOTE — Telephone Encounter (Signed)
Called patient to check in.  She is appreciative Dr. Elmyra Ricks care.  All questions answered.

## 2021-04-02 NOTE — Assessment & Plan Note (Signed)
Patient reports that psychiatry is requesting labs for lithium. Last labs obtained in December, all normal. Patient last took lithium yesterday evening around 2100. Lithium level, CMP and TSH today.

## 2021-04-02 NOTE — Progress Notes (Addendum)
    SUBJECTIVE:   CHIEF COMPLAINT / HPI:   Knee sweeling  Patient reported hx of OA) - recent injections about a month ago at pain clinic with PA Chang. Knee swelling started a week after getting the shots. Thinks she has fluid on her knees. Patient has had increasing difficulty with walking and weight bearing. She has not been seen by orthopedics or sports medicine, but does report a future appt scheduled with rheumatology (04/24/2021). She has been following with pain provider and was prescribed gabapentin, which she reports decreasing effectiveness despite increase to 400 mg TID recently.  She denies any fevers, chills, erythema to her knees.   Check Lithium Levels  Patient reports she is on lithium ER 900 mg, Zyprexa 20 mg, 7.5 mg mirtazipine and 1 mg cogentin BID. She reports her psychiatrist would like her levels to be checked today. Last labs in December 2021 (CMP, CBC, TSH, lithium, all wnl). Patient last took lithium around 2000-2100 last night.   Depo Provera  Patient would like dep shot today. She was scheduled to be seen with Dr. Manson Passey later this week. Given patient's history of transportation problem, will provide today.   PERTINENT  PMH / PSH: Bipolar  OBJECTIVE:   BP 105/76   Pulse (!) 111   Ht 5\' 4"  (1.626 m)   SpO2 99%   BMI 29.91 kg/m   Gen: patient well appearing, nontoxic. She is in wheelchair with cane next to her. Knee: on inspection, swelling bilaterally of knees. No erythema. Palpation: TTP superior patellar area. No significant pain at palpation to joint line, surrounding muscular or bony structures. With ROM exercises, can palpate and hear structures sliding within knee joint. ROM: severely limited bilaterally. Flexion of right knee to 90 degrees. Flexion of left knee more limited to about 120 degrees. Did not assess gait given patient's pain.   Radiograph 3/22  No formal radiology read. Severely decreased joint space, especially medially.   ASSESSMENT/PLAN:    Arthritis of knee Decreased joint space in bilateral knees. Patient with difficulty walking (using cane and brought back in wheelchair on arrival to [facility]) and does want to walk due to pain (even declining possible same day evaluation at [facility] due to pain). No concern for joint infection as cause of acute pain today.  Sp[facility] to [facility] about this patient's case. She is recommending that this patient be referred to orthopedics given her age and severity of radiographs. Referral to orthopedic surgery. Patient agreeable. Tylenol PRN for pain. She can continue gabapentin as rx'ed by pain management for knee pain. No NSAIDs as patient is on lithium. Patient has follow up with rheumatology later on this month.   Encounter for initial prescription of injectable contraceptive Patient received depo today. Serum beta HCG obtained as patient declined UPT.   Lithium use Patient reports that psychiatry is requesting labs for lithium. Last labs obtained in December, all normal. Patient last took lithium yesterday evening around 2100. Lithium level, CMP and TSH today.    2101, MD [facility]

## 2021-04-02 NOTE — Assessment & Plan Note (Addendum)
Patient received depo today. Serum beta HCG obtained as patient declined UPT.

## 2021-04-02 NOTE — Assessment & Plan Note (Addendum)
Decreased joint space in bilateral knees. Patient with difficulty walking (using cane and brought back in wheelchair on arrival to [facility]) and does want to walk due to pain (even declining possible same day evaluation at [facility] due to pain). No concern for joint infection as cause of acute pain today.  Spoke to Dr. Jennette Kettle about this patient's case. She is recommending that this patient be referred to orthopedics given her age and severity of radiographs. Referral to orthopedic surgery. Patient agreeable. Tylenol PRN for pain. She can continue gabapentin as rx'ed by pain management for knee pain. No NSAIDs as patient is on lithium. Patient has follow up with rheumatology later on this month.

## 2021-04-03 ENCOUNTER — Ambulatory Visit: Payer: Medicaid Other | Admitting: Sports Medicine

## 2021-04-03 LAB — COMPREHENSIVE METABOLIC PANEL
ALT: 8 IU/L (ref 0–32)
AST: 10 IU/L (ref 0–40)
Albumin/Globulin Ratio: 0.7 — ABNORMAL LOW (ref 1.2–2.2)
Albumin: 3.7 g/dL — ABNORMAL LOW (ref 3.9–5.0)
Alkaline Phosphatase: 91 IU/L (ref 44–121)
BUN/Creatinine Ratio: 9 (ref 9–23)
BUN: 4 mg/dL — ABNORMAL LOW (ref 6–20)
Bilirubin Total: 0.4 mg/dL (ref 0.0–1.2)
CO2: 20 mmol/L (ref 20–29)
Calcium: 9.5 mg/dL (ref 8.7–10.2)
Chloride: 105 mmol/L (ref 96–106)
Creatinine, Ser: 0.44 mg/dL — ABNORMAL LOW (ref 0.57–1.00)
Globulin, Total: 5 g/dL — ABNORMAL HIGH (ref 1.5–4.5)
Glucose: 97 mg/dL (ref 65–99)
Potassium: 4.1 mmol/L (ref 3.5–5.2)
Sodium: 137 mmol/L (ref 134–144)
Total Protein: 8.7 g/dL — ABNORMAL HIGH (ref 6.0–8.5)
eGFR: 133 mL/min/{1.73_m2} (ref >59)

## 2021-04-03 LAB — LITHIUM LEVEL: Lithium Lvl: 0.7 mmol/L (ref 0.5–1.2)

## 2021-04-03 LAB — HCG, SERUM, QUALITATIVE: hCG,Beta Subunit,Qual,Serum: NEGATIVE m[IU]/mL (ref <6)

## 2021-04-03 LAB — TSH: TSH: 1.35 u[IU]/mL (ref 0.450–4.500)

## 2021-04-04 ENCOUNTER — Ambulatory Visit: Payer: Medicaid Other | Admitting: Family Medicine

## 2021-04-04 ENCOUNTER — Telehealth: Payer: Self-pay

## 2021-04-04 NOTE — Telephone Encounter (Signed)
Patient calls nurse line requesting to begin process for disability claim. Advised patient that she would need an appointment to start process and have paperwork completed. Patient reports that she is unable to come into office due to arthritic pain. Patient is requesting virtual visit to discuss with provider.   Please advise.   Veronda Prude, RN

## 2021-04-04 NOTE — Telephone Encounter (Signed)
Attempted to call patient. If calls back, please let her know:   - I am happy to see her for medical care - We do not complete long term disability paperwork - To start the process of applying for disability, I recommend she reach out to: [place] social services  General Information: [phone] Disability Claim Status: [id number]

## 2021-04-07 ENCOUNTER — Telehealth: Payer: Self-pay

## 2021-04-07 DIAGNOSIS — M1710 Unilateral primary osteoarthritis, unspecified knee: Secondary | ICD-10-CM

## 2021-04-07 NOTE — Telephone Encounter (Signed)
Patient calls nurse line requesting power or manual wheelchair. Patient states that she would prefer to have power wheelchair if possible. Please advise if this referral can be placed.   Veronda Prude, RN

## 2021-04-07 NOTE — Telephone Encounter (Signed)
Appointment scheduled for 6/6 with PCP.   Veronda Prude, RN

## 2021-04-07 NOTE — Telephone Encounter (Signed)
Patient will need specific visit for this and PT evaluation. Please schedule with PCP for this type of visit.   [name], MD  Family Medicine Teaching Service

## 2021-04-07 NOTE — Telephone Encounter (Signed)
Left voice message for patient to call [facility] if she had any further questions.  Glennie Hawk, CMA

## 2021-04-08 ENCOUNTER — Ambulatory Visit: Payer: Medicaid Other | Admitting: Orthopaedic Surgery

## 2021-04-09 NOTE — Telephone Encounter (Addendum)
Patient returns call to nurse line regarding need for wheelchair.   Patient reports that she cannot wait until 6/6, as knee pain is persistent and is greatly affecting her ability to walk and care for herself. Patient and husband state that they need an appointment as soon as possible to address this concern and are requesting to see any provider. Scheduled patient with Dr. Mauri Reading on 5/16 for knee pain and to begin process for mobility/ wheelchair referral.   Veronda Prude, RN

## 2021-04-09 NOTE — Telephone Encounter (Signed)
Patient returns call to nurse line requesting to speak with Dr. Manson Passey as soon as possible regarding multiple concerns. Advised patient that provider is out of the office this week, however, I would relay this message.   Asked if there was anything I could help her with, patient states that she just needs to speak with Dr. Manson Passey.   To PCP.   Veronda Prude, RN

## 2021-04-09 NOTE — Addendum Note (Signed)
Addended by: Manson Passey, Hardeep Reetz on: 04/09/2021 02:01 PM   Modules accepted: Orders

## 2021-04-09 NOTE — Telephone Encounter (Signed)
Called patient to discuss wheelchair---discussed that I do not recommend given age, symptoms. Recommend PT evaluation in addition to Orthopedics.   Will await full PT evaluation before prescribing assistive device. She describes her gait as unstable, recommend checking labs, lithium level at follow up if neurological exam abnormal. Also consider medication interactions.  Routing to provider who saw her last (Dr. Selena Batten) and Dr. Mauri Reading, who sees her next week.  Instructed patient to call if further concerns, let her know Dr. Miquel Dunn is covering. All questions answered.  Natalie Starr, MD  Family Medicine Teaching Service

## 2021-04-11 NOTE — Progress Notes (Deleted)
Office Visit Note  Patient: Natalie [name]             Date of Birth: 16-Jul-1991           MRN: [id number]             PCP: Westley Chandler, MD Referring: Donato Heinz, PA-C Visit Date: 04/24/2021 Occupation: @GUAROCC @  Subjective:  No chief complaint on file.   History of Present Illness: Natalie [name] is a 30 y.o. female ***   Activities of Daily Living:  Patient reports morning stiffness for *** {minute/hour:19697}.   Patient {ACTIONS;DENIES/REPORTS:21021675::"Denies"} nocturnal pain.  Difficulty dressing/grooming: {ACTIONS;DENIES/REPORTS:21021675::"Denies"} Difficulty climbing stairs: {ACTIONS;DENIES/REPORTS:21021675::"Denies"} Difficulty getting out of chair: {ACTIONS;DENIES/REPORTS:21021675::"Denies"} Difficulty using hands for taps, buttons, cutlery, and/or writing: {ACTIONS;DENIES/REPORTS:21021675::"Denies"}  No Rheumatology ROS completed.   PMFS History:  Patient Active Problem List   Diagnosis Date Noted  . Arthritis of knee 04/02/2021  . Lithium use 04/02/2021  . Menorrhagia 12/05/2020  . Bipolar affective disorder, current episode manic with psychotic symptoms (HCC) 11/08/2020  . Pain in both lower extremities 10/15/2020  . Encounter for initial prescription of injectable contraceptive 06/21/2020  . Acute stress reaction 06/21/2020  . Morbid obesity (HCC) 10/23/2019  . Bipolar 2 disorder (HCC) 08/31/2014  . OCD (obsessive compulsive disorder)     Past Medical History:  Diagnosis Date  . Anxiety   . Chronic bipolar disorder (HCC)   . Depression   . H/O suicide attempt 05-2013   OD  . Obesity   . OCD (obsessive compulsive disorder)   . Psychiatric diagnosis     Family History  Problem Relation Age of Onset  . Hypertension Mother   . Depression Father   . Hypertension Father   . Bipolar disorder Father   . Hypertension Maternal Grandmother   . Obesity Maternal Grandmother   . Hypertension Paternal Grandmother    No past surgical history  on file. Social History   Social History Narrative   Lives with husband   Right handed   Drinks caffeine seldomly   Immunization History  Administered Date(s) Administered  . PFIZER(Purple Top)SARS-COV-2 Vaccination 07/22/2020, 08/12/2020  . Tdap 10/23/2019     Objective: Vital Signs: There were no vitals taken for this visit.   Physical Exam   Musculoskeletal Exam: ***  CDAI Exam: CDAI Score: -- Patient Global: --; Provider Global: -- Swollen: --; Tender: -- Joint Exam 04/24/2021   No joint exam has been documented for this visit   There is currently no information documented on the homunculus. Go to the Rheumatology activity and complete the homunculus joint exam.  Investigation: No additional findings.  Imaging: No results found.  Recent Labs: Lab Results  Component Value Date   WBC 8.8 11/06/2020   HGB 10.8 (L) 11/06/2020   PLT 294 11/06/2020   NA 137 04/02/2021   K 4.1 04/02/2021   CL 105 04/02/2021   CO2 20 04/02/2021   GLUCOSE 97 04/02/2021   BUN 4 (L) 04/02/2021   CREATININE 0.44 (L) 04/02/2021   BILITOT 0.4 04/02/2021   ALKPHOS 91 04/02/2021   AST 10 04/02/2021   ALT 8 04/02/2021   PROT 8.7 (H) 04/02/2021   ALBUMIN 3.7 (L) 04/02/2021   CALCIUM 9.5 04/02/2021   GFRAA 150 07/22/2020    Speciality Comments: No specialty comments available.  Procedures:  No procedures performed Allergies: Patient has no known allergies.   Assessment / Plan:     Visit Diagnoses: Pain in both hands  Chronic pain of both knees  Chronic pain syndrome  Bipolar affective disorder, current episode manic with psychotic symptoms (HCC)  Lithium use  History of OCD (obsessive compulsive disorder)  Encounter for initial prescription of injectable contraceptive  Menorrhagia with regular cycle  Orders: No orders of the defined types were placed in this encounter.  No orders of the defined types were placed in this encounter.   Face-to-face time spent  with patient was *** minutes. Greater than 50% of time was spent in counseling and coordination of care.  Follow-Up Instructions: No follow-ups on file.   Gearldine Bienenstock, PA-C  Note - This record has been created using Dragon software.  Chart creation errors have been sought, but may not always  have been located. Such creation errors do not reflect on  the standard of medical care.

## 2021-04-12 NOTE — Progress Notes (Deleted)
   Subjective:   Patient ID: Natalie [name]    DOB: 02-10-1991, 30 y.o. female   MRN: [id number]  Natalie [name] is a 30 y.o. female with a history of knee arthritis, bipolar 2 disorder, lithium use, menorrhagia, morbid obesity, OCD, pain in LE bilaterally here to discuss wheelchair  HPI: Patient seen on 04/02/21 for chronic bilateral knee pain with imaging notable for decreased joint space bilaterally. She has difficulty walking and uses a cane at baseline. She declined evaluation at sports medicine due to pain that day. She has been referred to Ortho and PT. She desires a wheel chair. Per PCP, desire to hold off on wheelchair evaluation for official PT evaluation first. Patient currently treating pain with tylenol PRN and Gabapentin. Avoiding NSAIDs as patient is on lithium.  Tried a rolling walker?  Review of Systems:  Per HPI.   Objective:   There were no vitals taken for this visit. Vitals and nursing note reviewed.  General: pleasant ***, sitting comfortably in exam chair, well nourished, well developed, in no acute distress with non-toxic appearance HEENT: normocephalic, atraumatic, moist mucous membranes, oropharynx clear without erythema or exudate, TM normal bilaterally  Neck: supple, non-tender without lymphadenopathy CV: regular rate and rhythm without murmurs, rubs, or gallops, no lower extremity edema, 2+ radial and pedal pulses bilaterally Lungs: clear to auscultation bilaterally with normal work of breathing on room air Resp: breathing comfortably on room air, speaking in full sentences Abdomen: soft, non-tender, non-distended, no masses or organomegaly palpable, normoactive bowel sounds Skin: warm, dry, no rashes or lesions Extremities: warm and well perfused, normal tone MSK: ROM grossly intact, strength intact, gait normal Neuro: Alert and oriented, speech normal  Assessment & Plan:   No problem-specific Assessment & Plan notes found for this encounter.  No orders  of the defined types were placed in this encounter.  No orders of the defined types were placed in this encounter.   {    This will disappear when note is signed, click to select method of visit    :1}  Orpah Cobb, DO PGY-3, [facility] 04/12/2021 1:10 PM

## 2021-04-14 ENCOUNTER — Ambulatory Visit
Admission: RE | Admit: 2021-04-14 | Discharge: 2021-04-14 | Disposition: A | Discharge status: Home / Self Care | Payer: Medicaid Other | Source: Ambulatory Visit | Attending: Physician Assistant | Admitting: Physician Assistant

## 2021-04-14 ENCOUNTER — Other Ambulatory Visit: Payer: Self-pay | Admitting: Physician Assistant

## 2021-04-14 ENCOUNTER — Ambulatory Visit: Payer: Medicaid Other | Admitting: Family Medicine

## 2021-04-14 DIAGNOSIS — G8929 Other chronic pain: Secondary | ICD-10-CM

## 2021-04-14 DIAGNOSIS — M25511 Pain in right shoulder: Secondary | ICD-10-CM

## 2021-04-21 ENCOUNTER — Telehealth: Payer: Self-pay | Admitting: Family Medicine

## 2021-04-21 NOTE — Telephone Encounter (Signed)
Called patient to check in re: missed appointments. Continues to have gait issues. Has follow up scheduled. Recommended keeping appointment to ensure we can further evaluate.  [name], MD  Family Medicine Teaching Service

## 2021-04-24 ENCOUNTER — Ambulatory Visit: Payer: Medicaid Other | Admitting: Rheumatology

## 2021-04-30 ENCOUNTER — Ambulatory Visit: Payer: Medicaid Other | Admitting: Orthopaedic Surgery

## 2021-05-01 ENCOUNTER — Other Ambulatory Visit: Payer: Self-pay

## 2021-05-01 ENCOUNTER — Ambulatory Visit (INDEPENDENT_AMBULATORY_CARE_PROVIDER_SITE_OTHER): Payer: Medicaid Other | Admitting: Family Medicine

## 2021-05-01 ENCOUNTER — Ambulatory Visit (INDEPENDENT_AMBULATORY_CARE_PROVIDER_SITE_OTHER): Payer: Medicaid Other

## 2021-05-01 ENCOUNTER — Encounter: Payer: Self-pay | Admitting: Family Medicine

## 2021-05-01 VITALS — BP 104/72 | HR 85 | Temp 98.8°F

## 2021-05-01 DIAGNOSIS — M130 Polyarthritis, unspecified: Secondary | ICD-10-CM

## 2021-05-01 DIAGNOSIS — Z79899 Other long term (current) drug therapy: Secondary | ICD-10-CM

## 2021-05-01 DIAGNOSIS — G8929 Other chronic pain: Secondary | ICD-10-CM

## 2021-05-01 DIAGNOSIS — R2681 Unsteadiness on feet: Secondary | ICD-10-CM | POA: Diagnosis not present

## 2021-05-01 DIAGNOSIS — M6281 Muscle weakness (generalized): Secondary | ICD-10-CM

## 2021-05-01 DIAGNOSIS — Z23 Encounter for immunization: Secondary | ICD-10-CM

## 2021-05-01 NOTE — Progress Notes (Signed)
Subjective:   Patient ID: Natalie [name]    DOB: 1991/08/25, 30 y.o. female   MRN: [id number]  Natalie [name] is a 30 y.o. female with a history of knee arthritis, bipolar 2 disorder, lithium use, menorrhagia, morbid obesity, OCD, pain in LE bilaterally here to discuss wheelchair  HPI: Patient seen on 04/02/21 for chronic bilateral knee pain with imaging notable for decreased joint space bilaterally. She has difficulty walking and uses a cane at baseline. She declined evaluation at sports medicine due to pain that day. She has been referred to Ortho and PT. She desires a wheel chair. Per PCP, desire to hold off on wheelchair evaluation for official PT evaluation first. Patient currently treating pain with tylenol PRN and Gabapentin. Avoiding NSAIDs as patient is on lithium.  She notes that she developed pain in her shoulders and knees about 4 years ago. Her pain is every day. Worse when she walks. She has difficulty walking. She has been treated with steroid injections into her knees and these helped initially but dont help anymore. Denies family history of having early arthritis. She has a rheumatology appointment on July 19th.   She is starting physical therapy on June 9th. She is not interested in another steroid injection. She is scheduled for steroid injection into her shoulders with her Spine/Pain specialist on June 14th.  She notes that she feels weak. She is scared she is going to fall. She uses a cane at baseline but she shakes all over when she walks because she is so scared. Husband notes it takes her >30 minutes to ambulate and get into the car which makes It very difficult for her to attend appointments.   Denies difficulty eating. Denies numbness/tingling in arms legs.  She is open to rolling walker for stability with ambulation.  Review of Systems:  Per HPI.   Objective:   BP 104/72   Pulse 85   Temp 98.8 F (37.1 C) (Oral)   SpO2 94%  Vitals and nursing note  reviewed.  General: pleasant woman, appears older than stated age, sitting comfortably in wheel chair, well nourished, well developed, in no acute distress with non-toxic appearance Resp: breathing comfortably on room air, speaking in full sentences Skin: warm, dry, no erythema or warmth along joints diffusely Extremities: warm and well perfused, normal ROM of ankles, toes, wrists, and fingers bilaterally, normal 5/5 strength in wrists/elbow/shoulders bilaterally, pain along joint line of knees bilaterally (L>R), mild soft tissue swelling of right knee without overlying skin changes, normal FABIR/FADIR, pain and decreased ROM of knees bilaterally, normal and symmetric calf sizes MSK: gait severely slow requiring cane (used in right hand), knees stay bent while ambulating, small slow steps, hunches over  Neuro: Alert and oriented, speech normal  Assessment & Plan:   Polyarticular arthritis Chronic. Significantly affecting her ability to ambulate safely and this also makes it difficult for her to attend her medical appointments regularly. Patient depends on husband to carry out some of her ADL/IADL's. Instability with 3-point cane is evident on exam today. Pain and function minimally improved with ICS injections per patient. She is scheduled to start PT which I suspect will help. Rheumatology appointment scheduled. Although this could be just osteoarthritis, given her age and degree of disease, I am concerned for autoimmune disease. She has a history of elevated ESR/CRP however has had a normal ANA. Although she denies weakness, polymyositis is also on differential. Does appear to have had a normal CK level in past though. Emphasized importance  of patient staying mobile as long as she can. She was amendable to rolling walker. - obtain RF, anti-CCP, ESR/CRP, CK level - DME order for rolling walker - PT as scheduled. Recommended at least 4 weeks of PT therapy then follow up with PCP for further  evaluation -follow up with rheum as scheduled  Lithium use Chronic. CMP and lithium level today to monitor  Orders Placed This Encounter  Procedures  . For home use only DME 4 wheeled rolling walker with seat ([id number])    Order Specific Question:   Patient needs a walker to treat with the following condition    Answer:   Unstable gait [[id number]]    Order Specific Question:   Patient needs a walker to treat with the following condition    Answer:   Osteoarthritis [202374]  . C-reactive protein  . Sedimentation Rate  . CK  . Rheumatoid factor  . CYCLIC CITRUL PEPTIDE ANTIBODY, IGG/IGA  . Comprehensive metabolic panel  . Lithium level   No orders of the defined types were placed in this encounter.     Mina Marble, DO PGY-3, [facility] 05/03/2021 11:11 PM

## 2021-05-01 NOTE — Patient Instructions (Signed)
I have obtained labs today. I will call you with the results and next steps. I have ordered you a rolling walker. I have talked to your PCP Dr. Manson Passey who would like to see you after 4 weeks of physical therapy to further evaluate.  Please be sure to follow up with the rheumatologist as scheduled.

## 2021-05-03 ENCOUNTER — Telehealth: Payer: Self-pay | Admitting: Family Medicine

## 2021-05-03 LAB — COMPREHENSIVE METABOLIC PANEL
ALT: 15 IU/L (ref 0–32)
AST: 15 IU/L (ref 0–40)
Albumin/Globulin Ratio: 0.9 — ABNORMAL LOW (ref 1.2–2.2)
Albumin: 4.2 g/dL (ref 3.9–5.0)
Alkaline Phosphatase: 94 IU/L (ref 44–121)
BUN/Creatinine Ratio: 8 — ABNORMAL LOW (ref 9–23)
BUN: 4 mg/dL — ABNORMAL LOW (ref 6–20)
Bilirubin Total: 0.3 mg/dL (ref 0.0–1.2)
CO2: 19 mmol/L — ABNORMAL LOW (ref 20–29)
Calcium: 9.4 mg/dL (ref 8.7–10.2)
Chloride: 104 mmol/L (ref 96–106)
Creatinine, Ser: 0.53 mg/dL — ABNORMAL LOW (ref 0.57–1.00)
Globulin, Total: 4.7 g/dL — ABNORMAL HIGH (ref 1.5–4.5)
Glucose: 94 mg/dL (ref 65–99)
Potassium: 4 mmol/L (ref 3.5–5.2)
Sodium: 139 mmol/L (ref 134–144)
Total Protein: 8.9 g/dL — ABNORMAL HIGH (ref 6.0–8.5)
eGFR: 128 mL/min/{1.73_m2} (ref >59)

## 2021-05-03 LAB — SEDIMENTATION RATE: Sed Rate: 67 mm/hr — ABNORMAL HIGH (ref 0–32)

## 2021-05-03 LAB — CK: Total CK: 25 U/L — ABNORMAL LOW (ref 32–182)

## 2021-05-03 LAB — RHEUMATOID FACTOR: Rheumatoid fact SerPl-aCnc: 99.1 IU/mL — ABNORMAL HIGH (ref <14.0)

## 2021-05-03 LAB — LITHIUM LEVEL: Lithium Lvl: 0.5 mmol/L (ref 0.5–1.2)

## 2021-05-03 LAB — CYCLIC CITRUL PEPTIDE ANTIBODY, IGG/IGA: Cyclic Citrullin Peptide Ab: >250 units — ABNORMAL HIGH (ref 0–19)

## 2021-05-03 LAB — C-REACTIVE PROTEIN: CRP: 55 mg/L — ABNORMAL HIGH (ref 0–10)

## 2021-05-03 NOTE — Telephone Encounter (Signed)
Attempted to call X1.   Will try again on Monday and see if we can expedite Rheumatology appointment.  [name], MD  Family Medicine Teaching Service

## 2021-05-03 NOTE — Assessment & Plan Note (Signed)
Chronic. CMP and lithium level today to monitor

## 2021-05-03 NOTE — Assessment & Plan Note (Addendum)
Chronic. Significantly affecting her ability to ambulate safely and this also makes it difficult for her to attend her medical appointments regularly. Patient depends on husband to carry out some of her ADL/IADL's. Instability with 3-point cane is evident on exam today. Pain and function minimally improved with ICS injections per patient. She is scheduled to start PT which I suspect will help. Rheumatology appointment scheduled. Although this could be just osteoarthritis, given her age and degree of disease, I am concerned for autoimmune disease. She has a history of elevated ESR/CRP however has had a normal ANA. Although she denies weakness, polymyositis is also on differential. Does appear to have had a normal CK level in past though. Emphasized importance of patient staying mobile as long as she can. She was amendable to rolling walker. - obtain RF, anti-CCP, ESR/CRP, CK level - DME order for rolling walker - PT as scheduled. Recommended at least 4 weeks of PT therapy then follow up with PCP for further evaluation -follow up with rheum as scheduled

## 2021-05-05 ENCOUNTER — Telehealth: Payer: Self-pay

## 2021-05-05 ENCOUNTER — Ambulatory Visit: Payer: Medicaid Other | Admitting: Family Medicine

## 2021-05-05 NOTE — Telephone Encounter (Signed)
Community message sent to [employer] for rolling walker. Will await response.   Veronda Prude, RN

## 2021-05-06 NOTE — Telephone Encounter (Signed)
Received below message from [employer].   received, thanks   Maylyn Narvaiz C Fahd Galea, RN   

## 2021-05-08 ENCOUNTER — Ambulatory Visit: Payer: Medicaid Other

## 2021-05-12 ENCOUNTER — Ambulatory Visit: Payer: Medicaid Other | Admitting: Rheumatology

## 2021-05-15 ENCOUNTER — Ambulatory Visit: Payer: Medicaid Other

## 2021-05-20 ENCOUNTER — Ambulatory Visit: Payer: Medicaid Other | Admitting: Orthopaedic Surgery

## 2021-05-22 ENCOUNTER — Ambulatory Visit: Payer: Medicaid Other | Admitting: Rheumatology

## 2021-06-03 ENCOUNTER — Ambulatory Visit: Payer: Medicaid Other | Attending: Family Medicine

## 2021-06-03 ENCOUNTER — Other Ambulatory Visit: Payer: Self-pay

## 2021-06-03 DIAGNOSIS — M25662 Stiffness of left knee, not elsewhere classified: Secondary | ICD-10-CM | POA: Diagnosis present

## 2021-06-03 DIAGNOSIS — M6281 Muscle weakness (generalized): Secondary | ICD-10-CM | POA: Insufficient documentation

## 2021-06-03 DIAGNOSIS — M170 Bilateral primary osteoarthritis of knee: Secondary | ICD-10-CM | POA: Insufficient documentation

## 2021-06-03 DIAGNOSIS — M25661 Stiffness of right knee, not elsewhere classified: Secondary | ICD-10-CM | POA: Insufficient documentation

## 2021-06-03 DIAGNOSIS — R262 Difficulty in walking, not elsewhere classified: Secondary | ICD-10-CM | POA: Diagnosis present

## 2021-06-03 NOTE — Therapy (Addendum)
[facility] [address] [place], [place], [address] Phone: [phone]   Fax:  [phone]  Physical Therapy Evaluation / Discharge  Patient Details  Name: Natalie [name] MRN: [id number] Date of Birth: 1990-12-30 Referring Provider (PT): Westley Chandler, MD   Encounter Date: 06/03/2021   PT End of Session - 06/03/21 1610     Visit Number 1    Number of Visits 13    Date for PT Re-Evaluation 08/26/21    Authorization Type [place] [facility] Access Re-eval on 4th visit.    PT Start Time 1400    PT Stop Time 1455    PT Time Calculation (min) 55 min    Equipment Utilized During Treatment Gait belt    Activity Tolerance Patient tolerated treatment well    Behavior During Therapy WFL for tasks assessed/performed             Past Medical History:  Diagnosis Date   Anxiety    Chronic bipolar disorder (HCC)    Depression    H/O suicide attempt 05-2013   OD   Obesity    OCD (obsessive compulsive disorder)    Psychiatric diagnosis     History reviewed. No pertinent surgical history.  There were no vitals filed for this visit.    Subjective Assessment - 06/03/21 1359     Subjective The patient reports that she can not walk right.  She reports that it has progressed over the last three years.  She reports having severe weakness and pain.  She is taking medication and that does not seem to help anymore.  She reports that she was told that she bone on bone in the kness bilaterally.  She has been told that she has osteoarthritis.  She reports difficulty with getting off the toilet.  She reports that her muscle will shake like she is scared or fearful.  Natalie [name] reports that she has been using a quad cane for about 3 months.  Prior to that she did not have a cane or rollator.  The patient reports that she is limited with walking to about 150 feet with the cane.  Natalie [name] reports that her pain at worse in the last week would be a 7/10  in the knees/legs.  She reports that the happened for a whole day for no known cause.  She is usually able to sleep through the night, but will have times when the pain keeps her up.   She has had a gel injection into the knee, but it did not seem to help.    Patient is accompained by: Family member    Limitations Walking;Standing;House hold activities    How long can you sit comfortably? not limited    How long can you stand comfortably? 10 -15 minutes    How long can you walk comfortably? 150 feet with cane    Diagnostic tests X-ray:EXAM:  RIGHT KNEE - 1-2 VIEW     COMPARISON:  None.     FINDINGS:  No evidence of fracture, dislocation, or joint effusion. There is  tricompartment joint space narrowing with subchondral cyst formation  and osteophytosis worst in the medial compartment where it is at  least moderate. Soft tissues are unremarkable.     IMPRESSION:  1. Moderate tricompartmental osteoarthritis, worst in the medial  compartment.  2. No acute findings.EXAM:  LEFT KNEE - 1-2 VIEW     COMPARISON:  None.     FINDINGS:  No evidence of fracture, dislocation,  or joint effusion. There is  tricompartment joint space narrowing with osteophytosis and  subchondral cyst formation worse in the medial compartment where it  is at least moderate. Soft tissues are unremarkable.     IMPRESSION:  1. Moderate tricompartmental osteoarthritis, worse in the medial  compartment where it is at least moderate.  2. No acute findings.    Patient Stated Goals To recover as much as possible.    Currently in Pain? No/denies                [facility] PT Assessment - 06/03/21 0001       Assessment   Medical Diagnosis M17.10 (ICD-10-CM) - Arthritis of knee    Referring Provider (PT) Westley Chandler, MD    Onset Date/Surgical Date --   over the last three years   Hand Dominance Right    Next MD Visit Schedule post PT    Prior Therapy No      Precautions   Precautions None      Restrictions   Weight Bearing  Restrictions No      Balance Screen   Has the patient fallen in the past 6 months No    Has the patient had a decrease in activity level because of a fear of falling?  Yes    Is the patient reluctant to leave their home because of a fear of falling?  Yes      Home Environment   Living Environment Private residence    Living Arrangements Spouse/significant other    Type of Home Apartment    Home Access Stairs to enter    Entrance Stairs-Number of Steps 2    Entrance Stairs-Rails None    Home Layout One level    Home Equipment Cane - quad;Walker - 4 wheels;Toilet riser      Prior Function   Level of Independence Independent with basic ADLs    Vocation On disability      Cognition   Overall Cognitive Status Within Functional Limits for tasks assessed      ROM / Strength   AROM / PROM / Strength AROM;Strength      AROM   Right Knee Extension -25    Right Knee Flexion 125    Left Knee Extension -25    Left Knee Flexion 95      Strength   Right Knee Flexion 4/5    Right Knee Extension 4-/5    Left Knee Flexion 3+/5   pain limited   Left Knee Extension 3+/5   pain limited     Palpation   Patella mobility hypomobility L > R      Timed Up and Go Test   Normal TUG (seconds) 65    TUG Comments use of quad cane                        Objective measurements completed on examination: See above findings.       [facility] Adult PT Treatment/Exercise - 06/03/21 0001       Exercises   Exercises Knee/Hip      Knee/Hip Exercises: Seated   Heel Slides Left;Right;15 reps    Ball Squeeze 5" x 10    Clamshell with TheraBand Green   5" x 10     Knee/Hip Exercises: Supine   Quad Sets Both;15 reps                    PT Education -  06/03/21 1505     Education Details POC, HEP    Person(s) Educated Patient    Methods Explanation;Handout    Comprehension Verbalized understanding              PT Short Term Goals - 06/03/21 1522       PT SHORT  TERM GOAL #1   Title The patient will be independent in a basic HEP for the lower extremities.    Time 2    Period Weeks    Target Date 06/17/21               PT Long Term Goals - 06/03/21 1524       PT LONG TERM GOAL #1   Title The patient will present with improvement of bilateral knee active extension range of motion to -15 degrees for improvement of gait and walking tolerance.    Baseline -25 degrees    Time 12    Period Weeks    Target Date 08/26/21      PT LONG TERM GOAL #2   Title The patient will present with improvement of left knee active flexion ROM to 110 degrees for transfers.    Baseline 95 degrees    Time 12    Period Weeks    Target Date 08/26/21      PT LONG TERM GOAL #3   Title The patient will have improvement of TUG to 20-13 seconds or less with use of a cane for gait safety    Baseline 65 seconds    Time 12    Period Weeks    Target Date 08/26/21      PT LONG TERM GOAL #4   Title The patient will present with improvement of bilateral quadriceps strength to 4+/5 for transfers.    Baseline 4- to 3+    Time 12    Period Weeks    Target Date 08/26/21                    Plan - 06/03/21 1505     Clinical Impression Statement Natalie [name] is a 30 y/o female who reports a gradual onset of bilateral knee pain over the last three years.  She has had x-rays of the knees that reveal arthritis.  The patient reports that she started walking with a quad cane 3 months ago.  She has rollator at home as well.  She reports being limited with walking to about 150 feet with her cane.  She used a wheelchair to get into the clinic today.  The patient is limited with bilateral knee extension motion to -25 degrees.   Her right knee active flexion is at 125 degrees and the left limited to 95 degrees.  She has signficant weakness of the knees with manual muscle test due to pain.  She is at high risk of falls per the TUG test.  Recommend physical therapy for improvement  of bilateral knee range of motion, lower extremity strengthening, and balance/gait training.    Personal Factors and Comorbidities Comorbidity 3+    Comorbidities OCD, bipolar disorder, overweight    Examination-Activity Limitations Bathing;Bed Mobility;Bend;Caring for Others;Carry;Dressing;Hygiene/Grooming;Lift;Toileting;Stand;Stairs;Squat;Sit;Locomotion Level;Transfers    Examination-Participation Restrictions Cleaning;Meal Prep;Community Activity;Shop;Laundry;Volunteer;Yard Work    Conservation officer, historic buildings Evolving/Moderate complexity    Clinical Decision Making Moderate    Rehab Potential Good    PT Frequency 1x / week    PT Duration 12 weeks    PT Treatment/Interventions ADLs/Self Care Home Management;Aquatic Therapy;Cryotherapy;Electrical Stimulation;Moist Heat;Gait training;Stair  training;Functional mobility training;Therapeutic activities;Therapeutic exercise;Balance training;Neuromuscular re-education;Manual techniques;Patient/family education;Passive range of motion;Dry needling;Taping;Joint Manipulations    PT Next Visit Plan 5 STS test, review HEP,  knee ROM, patella mobs, joint mobs,  Nustep,  LE strengthening.    PT Home Exercise Plan Access Code: [email or web address]    Consulted and Agree with Plan of Care Patient;Family member/caregiver    Family Member Consulted Husband             Patient will benefit from skilled therapeutic intervention in order to improve the following deficits and impairments:  Abnormal gait, Decreased range of motion, Difficulty walking, Decreased endurance, Hypomobility, Pain, Decreased balance, Decreased mobility, Decreased strength, Postural dysfunction  Visit Diagnosis: Primary osteoarthritis of both knees - Plan: PT plan of care cert/re-cert  Difficulty in walking, not elsewhere classified - Plan: PT plan of care cert/re-cert  Decreased range of motion (ROM) of both knees - Plan: PT plan of care cert/re-cert  Muscle weakness (generalized) -  Plan: PT plan of care cert/re-cert     Problem List 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 DN  Robet Leu 06/03/2021, 4:35 PM   06/30/21:  Natalie [name] called and reported that she would like to be discharged from therapy.  She is having issues getting here and has other things going on.  Unable to update Objectives measurements or assess goals due to non-return.  Sharol Roussel, PT, DPT, OCS, Crt. DN  [facility] [address] [place], [place], [address] Phone: [phone]   Fax:  [phone]  Name: Natalie [name] MRN: [id number] Date of Birth: Feb 21, 1991

## 2021-06-03 NOTE — Patient Instructions (Signed)
Access Code: [email or web address] URL: [email or web address] Date: 06/03/2021 Prepared by: Sharol Roussel  Exercises Seated Hip Adduction Isometrics with Newman Pies - 1 x daily - 7 x weekly - 1 sets - 15 reps - 5 hold Supine Quadricep Sets - 1 x daily - 7 x weekly - 1 sets - 15 reps - 5 hold Seated Heel Slide - 1 x daily - 7 x weekly - 1 sets - 15 reps Seated Hip Abduction with Resistance - 1 x daily - 7 x weekly - 1 sets - 15 reps - 5 hold

## 2021-06-03 NOTE — Progress Notes (Signed)
Office Visit Note  Patient: Natalie [name]             Date of Birth: [birth year]           MRN: [id number]             PCP: Martyn Malay, MD Referring: Wayland Salinas, PA-C Visit Date: 06/17/2021 Occupation: @GUAROCC @  Subjective:  New Patient (Initial Visit) (Patient complains of generalized joint and muscle pain as well as joint swelling. Patient is currently using a cane and walker. )   History of Present Illness: Natalie [name] is a 30 y.o. female accompanied by her husband PJ today.  According the patient her symptoms a started about 3 years ago with increased pain and discomfort in multiple joints.  She states initially the pain was in her hands and then it moved to her other joints.  She complains of pain and discomfort in her bilateral shoulders, elbows, wrists, hands, hips, knees, ankles and her feet.  She also has muscle pain.  She notices swelling in her bilateral feet.  She was initially seen by her PCP and then a neurologist few years back.  They did nerve conduction velocities and EMG and offered Cymbalta but she could not take it due to underlying bipolar disorder.  [facility] where she has been under care of Mr. Radene Knee.  She has had cortisone injections to her knees but 2 months ago which gave her some relief.  She states the pain has recurred.  She had recent x-rays of her joints and March 2022 which showed moderate to severe arthritis in her knee joints.  She also had x-rays of her hands and her shoulders.  She has a lot of difficulty walking.  She has been using a cane and a Rollator for last few months.  There is no family history of autoimmune disease.  She is gravida 0, para 0.  There is no history of DVTs.  Activities of Daily Living:  Patient reports morning stiffness for 24 hours.   Patient Reports nocturnal pain.  Difficulty dressing/grooming: Reports Difficulty climbing stairs: Reports Difficulty getting out of chair:  Reports Difficulty using hands for taps, buttons, cutlery, and/or writing: Reports  Review of Systems  Constitutional:  Positive for fatigue.  HENT:  Negative for mouth sores, mouth dryness and nose dryness.   Eyes:  Negative for pain, itching, visual disturbance and dryness.  Respiratory:  Positive for shortness of breath. Negative for cough, hemoptysis and difficulty breathing.   Cardiovascular:  Positive for swelling in legs/feet. Negative for chest pain and palpitations.  Gastrointestinal:  Negative for abdominal pain, blood in stool, constipation and diarrhea.  Endocrine: Negative for increased urination.  Genitourinary:  Negative for painful urination.  Musculoskeletal:  Positive for joint pain, joint pain, joint swelling, myalgias, muscle weakness, morning stiffness, muscle tenderness and myalgias.  Skin:  Negative for color change, rash, redness and sensitivity to sunlight.  Allergic/Immunologic: Negative for susceptible to infections.  Neurological:  Positive for numbness and weakness. Negative for dizziness, headaches and memory loss.  Hematological:  Negative for swollen glands.  Psychiatric/Behavioral:  Positive for depressed mood and sleep disturbance. Negative for confusion. The patient is nervous/anxious.    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   Lithium use 04/02/2021   Menorrhagia 12/05/2020   Bipolar affective disorder, current episode manic with psychotic symptoms ([place]) 11/08/2020   Pain  in both lower extremities 10/15/2020   Encounter for initial prescription of injectable contraceptive 06/21/2020   Acute stress reaction 06/21/2020   Morbid obesity ([place]) 10/23/2019   Bipolar 2 disorder ([place]) 08/31/2014   OCD (obsessive compulsive disorder)     Past Medical History:  Diagnosis Date   Anxiety    Chronic bipolar disorder ([place])    Depression    H/O suicide attempt 05-2013   OD   Obesity    OCD  (obsessive compulsive disorder)    Psychiatric diagnosis     Family History  Problem Relation Age of Onset   Hypertension Mother    Depression Father    Hypertension Father    Bipolar disorder Father    Hypertension Maternal Grandmother    Obesity Maternal Grandmother    Arthritis Maternal Grandmother    Hypertension Paternal Grandmother    History reviewed. No pertinent surgical history. Social History   Social History Narrative   Lives with husband   Right handed   Drinks caffeine seldomly   Immunization History  Administered Date(s) Administered   PFIZER Comirnaty(Gray Top)Covid-19 Tri-Sucrose Vaccine 05/01/2021   PFIZER(Purple Top)SARS-COV-2 Vaccination 07/22/2020, 08/12/2020   Tdap 10/23/2019     Objective: Vital Signs: BP 110/83 (BP Location: Right Arm, Patient Position: Sitting, Cuff Size: Normal)   Pulse (!) 118   Ht 5' 4"  (1.626 m) Comment: Patient stated  Wt 190 lb (86.2 kg) Comment: Estimate, patient stated  BMI 32.61 kg/m    Physical Exam Vitals and nursing note reviewed.  Constitutional:      Appearance: She is well-developed.  HENT:     Head: Normocephalic and atraumatic.  Eyes:     Conjunctiva/sclera: Conjunctivae normal.  Cardiovascular:     Rate and Rhythm: Normal rate and regular rhythm.     Heart sounds: Normal heart sounds.  Pulmonary:     Effort: Pulmonary effort is normal.     Breath sounds: Normal breath sounds.  Abdominal:     General: Bowel sounds are normal.     Palpations: Abdomen is soft.  Musculoskeletal:     Cervical back: Normal range of motion.  Lymphadenopathy:     Cervical: No cervical adenopathy.  Skin:    General: Skin is warm and dry.     Capillary Refill: Capillary refill takes less than 2 seconds.  Neurological:     Mental Status: She is alert and oriented to person, place, and time.  Psychiatric:        Behavior: Behavior normal.     Musculoskeletal Exam: Patient was in a wheelchair.  She is unable to stand.   She mobilizes with a cane or Rollator.  C-spine was in good range of motion.  Shoulder joints were in good range of motion with discomfort.  She had contractures in the bilateral elbows.  She had limited range of motion of bilateral wrist joints with synovitis.  She had synovitis over bilateral MCPs.  Hip joints were difficult to assess in the wheelchair.  She had synovitis and swelling in bilateral knee joints with limited extension.  She had swelling over bilateral ankle joints and tenderness over MTPs.  CDAI Exam: CDAI Score: 46  Patient Global: 10 mm; Provider Global: 10 mm Swollen: 27 ; Tender: 29  Joint Exam 06/17/2021      Right  Left  Glenohumeral   Tender   Tender  Elbow  Swollen Tender  Swollen Tender  Wrist  Swollen Tender  Swollen Tender  MCP 1  Swollen Tender  MCP 2  Swollen Tender  Swollen Tender  MCP 3  Swollen Tender  Swollen Tender  MCP 4  Swollen Tender  Swollen Tender  MCP 5     Swollen Tender  PIP 2  Swollen Tender  Swollen Tender  PIP 3  Swollen Tender  Swollen Tender  PIP 4  Swollen Tender  Swollen Tender  PIP 5  Swollen Tender     Knee  Swollen Tender  Swollen Tender  Ankle  Swollen Tender  Swollen Tender  MTP 2  Swollen Tender  Swollen Tender  MTP 3     Swollen Tender  MTP 4  Swollen Tender        Investigation: No additional findings.  Imaging: No results found.  Recent Labs: Lab Results  Component Value Date   WBC 8.8 11/06/2020   HGB 10.8 (L) 11/06/2020   PLT 294 11/06/2020   NA 139 05/01/2021   K 4.0 05/01/2021   CL 104 05/01/2021   CO2 19 (L) 05/01/2021   GLUCOSE 94 05/01/2021   BUN 4 (L) 05/01/2021   CREATININE 0.53 (L) 05/01/2021   BILITOT 0.3 05/01/2021   ALKPHOS 94 05/01/2021   AST 15 05/01/2021   ALT 15 05/01/2021   PROT 8.9 (H) 05/01/2021   ALBUMIN 4.2 05/01/2021   CALCIUM 9.4 05/01/2021   GFRAA 150 07/22/2020    Speciality Comments: No specialty comments available.  Procedures:  No procedures performed Allergies:  Patient has no known allergies.   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  She has positive rheumatoid factor and positive anti-CCP antibody.  Detailed counseling on rheumatoid arthritis was provided.  In a wheelchair today.  She has been able to mobilize without the help of a cane or Rollator.  I will give her a prednisone taper starting at 20 mg and taper by 5 mg every week.  Side effects of prednisone were discussed.  Detailed counseling regarding rheumatoid arthritis was provided.  I would wait on the labs prior to making a decision on the immunosuppressive therapy.  As she is on lithium I believe subcutaneous Enbrel will be a good choice as she has very aggressive erosive rheumatoid arthritis.  05/01/21: CRP 55, ESR 67, CK 25, RF 99.1, anti-CCP>250   Pain in both hands -she had synovitis over bilateral wrist joints and bilateral MCP joints and PIP joints as described above.  I reviewed x-ray of bilateral hands from February 06, 2021 which showed bilateral juxta-articular osteopenia and narrowing of the MCP joints, PIP joints, intercarpal and radiocarpal joint space narrowing was noted.  Possible erosions were noted over the left fourth MCP joint.  Erosive changes were noted in the carpal bones. Plan: ANA  Contracture of joint of both elbows-she has synovitis over her elbows.  Chronic pain of both knees-she has warmth and swelling over bilateral knee joints with limited extension.  X-rays of bilateral knee joints from March 2022 were reviewed.  Patient showed bilateral compartmental severe narrowing and erosive changes.  Bilateral moderate chondromalacia patella was noted.  She will eventually need bilateral total knee replacement.  She could not extend her knee joints.  Pain in both feet -she had tenderness over  bilateral ankles and MTPs.  Plan: XR Foot 2 Views Right, XR Foot 2 Views Left  High risk medication use -I will obtain following labs and x-rays  in anticipation to start her on immunosuppressive therapy in the future.  Plan: DG Chest 2 View, CBC with Differential/Platelet, Hepatitis B core antibody, IgM, Hepatitis B surface antigen, Hepatitis C antibody, QuantiFERON-TB Gold Plus, Serum protein electrophoresis with reflex, IgG, IgA, IgM, HIV Antibody (routine testing w rflx)  Myalgia - Plan: TSH  Chronic pain syndrome-she has been followed with pain management.  Bipolar affective disorder, current episode manic with psychotic symptoms ([place])  Lithium use  Orders: Orders Placed This Encounter  Procedures   XR Foot 2 Views Right   XR Foot 2 Views Left   DG Chest 2 View   CBC with Differential/Platelet   TSH   ANA   Hepatitis B core antibody, IgM   Hepatitis B surface antigen   Hepatitis C antibody   QuantiFERON-TB Gold Plus   Serum protein electrophoresis with reflex   IgG, IgA, IgM   HIV Antibody (routine testing w rflx)    Meds ordered this encounter  Medications   predniSONE (DELTASONE) 5 MG tablet    Sig: Take 4 tabs po qd x 7 days, 3  tabs po qd x 7 days, 2  tabs po qd x 7 days, 1  tab po qd x 7 days    Dispense:  70 tablet    Refill:  0      Follow-Up Instructions: Return for Rheumatoid arthritis.   Bo Merino, MD  Note - This record has been created using Editor, commissioning.  Chart creation errors have been sought, but may not always  have been located. Such creation errors do not reflect on  the standard of medical care.

## 2021-06-12 ENCOUNTER — Ambulatory Visit: Payer: Medicaid Other

## 2021-06-13 ENCOUNTER — Ambulatory Visit: Payer: Medicaid Other | Admitting: Orthopaedic Surgery

## 2021-06-16 ENCOUNTER — Ambulatory Visit: Payer: Medicaid Other

## 2021-06-17 ENCOUNTER — Other Ambulatory Visit: Payer: Self-pay

## 2021-06-17 ENCOUNTER — Ambulatory Visit: Payer: Self-pay

## 2021-06-17 ENCOUNTER — Ambulatory Visit: Payer: Medicaid Other | Admitting: Rheumatology

## 2021-06-17 ENCOUNTER — Encounter: Payer: Self-pay | Admitting: Rheumatology

## 2021-06-17 VITALS — BP 110/83 | HR 118 | Ht 64.0 in | Wt 190.0 lb

## 2021-06-17 DIAGNOSIS — M79672 Pain in left foot: Secondary | ICD-10-CM

## 2021-06-17 DIAGNOSIS — G8929 Other chronic pain: Secondary | ICD-10-CM

## 2021-06-17 DIAGNOSIS — M79642 Pain in left hand: Secondary | ICD-10-CM

## 2021-06-17 DIAGNOSIS — M24521 Contracture, right elbow: Secondary | ICD-10-CM

## 2021-06-17 DIAGNOSIS — G894 Chronic pain syndrome: Secondary | ICD-10-CM

## 2021-06-17 DIAGNOSIS — M79641 Pain in right hand: Secondary | ICD-10-CM | POA: Diagnosis not present

## 2021-06-17 DIAGNOSIS — M0579 Rheumatoid arthritis with rheumatoid factor of multiple sites without organ or systems involvement: Secondary | ICD-10-CM | POA: Diagnosis not present

## 2021-06-17 DIAGNOSIS — M79671 Pain in right foot: Secondary | ICD-10-CM

## 2021-06-17 DIAGNOSIS — M791 Myalgia, unspecified site: Secondary | ICD-10-CM

## 2021-06-17 DIAGNOSIS — F312 Bipolar disorder, current episode manic severe with psychotic features: Secondary | ICD-10-CM

## 2021-06-17 DIAGNOSIS — Z79899 Other long term (current) drug therapy: Secondary | ICD-10-CM

## 2021-06-17 DIAGNOSIS — M24522 Contracture, left elbow: Secondary | ICD-10-CM

## 2021-06-17 DIAGNOSIS — M25561 Pain in right knee: Secondary | ICD-10-CM | POA: Diagnosis not present

## 2021-06-17 DIAGNOSIS — M25562 Pain in left knee: Secondary | ICD-10-CM

## 2021-06-17 MED ORDER — PREDNISONE 5 MG PO TABS
ORAL_TABLET | ORAL | 0 refills | Status: DC
Start: 2021-06-17 — End: 2021-08-08

## 2021-06-17 NOTE — Patient Instructions (Addendum)
Prednisone tablets What is this medication? PREDNISONE (PRED ni sone) is a corticosteroid. It is commonly used to treat inflammation of the skin, joints, lungs, and other organs. Common conditions treated include asthma, allergies, and arthritis. It is also used for otherconditions, such as blood disorders and diseases of the adrenal glands. This medicine may be used for other purposes; ask your health care provider orpharmacist if you have questions. COMMON BRAND NAME(S): Deltasone, Predone, Sterapred, Sterapred DS What should I tell my care team before I take this medication? They need to know if you have any of these conditions: Cushing's syndrome diabetes glaucoma heart disease high blood pressure infection (especially a virus infection such as chickenpox, cold sores, or herpes) kidney disease liver disease mental illness myasthenia gravis osteoporosis seizures stomach or intestine problems thyroid disease an unusual or allergic reaction to lactose, prednisone, other medicines, foods, dyes, or preservatives pregnant or trying to get pregnant breast-feeding How should I use this medication? Take this medicine by mouth with a glass of water. Follow the directions on the prescription label. Take this medicine with food. If you are taking this medicine once a day, take it in the morning. Do not take more medicine than you are told to take. Do not suddenly stop taking your medicine because you may develop a severe reaction. Your doctor will tell you how much medicine to take. If your doctor wants you to stop the medicine, the dose may be slowly loweredover time to avoid any side effects. Talk to your pediatrician regarding the use of this medicine in children.Special care may be needed. Overdosage: If you think you have taken too much of this medicine contact apoison control center or emergency room at once. NOTE: This medicine is only for you. Do not share this medicine with others. What  if I miss a dose? If you miss a dose, take it as soon as you can. If it is almost time for your next dose, talk to your doctor or health care professional. You may need to miss a dose or take an extra dose. Do not take double or extra doses withoutadvice. What may interact with this medication? Do not take this medicine with any of the following medications: metyrapone mifepristone This medicine may also interact with the following medications: aminoglutethimide amphotericin B aspirin and aspirin-like medicines barbiturates certain medicines for diabetes, like glipizide or glyburide cholestyramine cholinesterase inhibitors cyclosporine digoxin diuretics ephedrine female hormones, like estrogens and birth control pills isoniazid ketoconazole NSAIDS, medicines for pain and inflammation, like ibuprofen or naproxen phenytoin rifampin toxoids vaccines warfarin This list may not describe all possible interactions. Give your health care provider a list of all the medicines, herbs, non-prescription drugs, or dietary supplements you use. Also tell them if you smoke, drink alcohol, or use illegaldrugs. Some items may interact with your medicine. What should I watch for while using this medication? Visit your doctor or health care professional for regular checks on your progress. If you are taking this medicine over a prolonged period, carry an identification card with your name and address, the type and dose of yourmedicine, and your doctor's name and address. This medicine may increase your risk of getting an infection. Tell your doctor or health care professional if you are around anyone with measles orchickenpox, or if you develop sores or blisters that do not heal properly. If you are going to have surgery, tell your doctor or health care professionalthat you have taken this medicine within the last twelve months. Ask  your doctor or health care professional about your diet. You may need  tolower the amount of salt you eat. This medicine may increase blood sugar. Ask your healthcare provider if changesin diet or medicines are needed if you have diabetes. What side effects may I notice from receiving this medication? Side effects that you should report to your doctor or health care professionalas soon as possible: allergic reactions like skin rash, itching or hives, swelling of the face, lips, or tongue changes in emotions or moods changes in vision depressed mood eye pain fever or chills, cough, sore throat, pain or difficulty passing urine signs and symptoms of high blood sugar such as being more thirsty or hungry or having to urinate more than normal. You may also feel very tired or have blurry vision. swelling of ankles, feet Side effects that usually do not require medical attention (report to yourdoctor or health care professional if they continue or are bothersome): confusion, excitement, restlessness headache nausea, vomiting skin problems, acne, thin and shiny skin trouble sleeping weight gain This list may not describe all possible side effects. Call your doctor for medical advice about side effects. You may report side effects to FDA at1-800-FDA-1088. Where should I keep my medication? Keep out of the reach of children. Store at room temperature between 15 and 30 degrees C (59 and 86 degrees F). Protect from light. Keep container tightly closed. Throw away any unusedmedicine after the expiration date. NOTE: This sheet is a summary. It may not cover all possible information. If you have questions about this medicine, talk to your doctor, pharmacist, orhealth care provider.  2022 Elsevier/Gold Standard (2018-08-16 10:54:22)  Rheumatoid Arthritis Rheumatoid arthritis (RA) is a long-term (chronic) disease that causes inflammation in your joints. RA may start slowly. It most often affects the small joints of the hands and feet. Usually, the same joints are affected on  both sides of your body. Inflammation from RA can also affectother parts of your body, including your heart, eyes, or lungs. There is no cure for RA, but medicines can help your symptoms and halt or slowdown the progression of the disease. What are the causes? RA is an autoimmune disease. When you have an autoimmune disease, your body's defense system (immune system) mistakenly attacks healthy body tissues. The exact cause of RA is not known. What increases the risk? You are more likely to develop this condition if you: Are a woman. Have a family history of RA or other autoimmune diseases. Have a history of smoking. Are obese. Have been exposed to pollutants or chemicals. What are the signs or symptoms? The first symptom of this condition may be morning stiffness that lasts longer than 30 minutes. Symptoms usually start gradually. They are often worse in the morning. As RA progresses, symptoms may include: Pain, stiffness, swelling, warmth, and tenderness in joints on both sides of your body. Loss of energy. Loss of appetite. Weight loss. Low-grade fever. Dry eyes and dry mouth. Firm lumps (rheumatoid nodules) that grow beneath your skin in areas such as your forearm bones near your elbows and on your hands. Changes in the appearance of joints (deformity) and loss of joint function. Symptoms of this condition vary from person to person. Symptoms of RA often come and go. Sometimes, symptoms get worse for a period of time. These are called flares. How is this diagnosed? This condition is diagnosed based on your symptoms, medical history, and physical exam. You may have X-rays or an MRI to check for the  type of joint changes that are caused by RA. You may also have blood tests to look for: Proteins (antibodies) that your immune system may make if you have RA. These include rheumatoid factor (RF) and anti-CCP. When blood tests show these proteins, you are said to have "seropositive  RA." When blood tests do not show these proteins, you may have "seronegative RA." Inflammation in your blood. A low number of red blood cells (anemia). How is this treated? The goals of treatment are to relieve pain, reduce inflammation, and slow down or stop joint damage and disability. Treatment may include: Lifestyle changes. It is important to rest as needed, eat a healthy diet, and exercise. Medicines. Your health care provider may adjust your medicines every 3 months until treatment goals are reached. Common medicines include: Pain relievers (analgesics). Corticosteroids and NSAIDs to reduce inflammation. Disease-modifying antirheumatic drugs (DMARDs) to try to slow the course of the disease. Biologic response modifiers to reduce inflammation and damage. Physical therapy and occupational therapy. Surgery, if you have severe joint damage. Joint replacement or fusing of joints may be needed. Your health care provider will work with you to identify the best treatmentoption for you based on assessment of the overall disease activity in your body. Follow these instructions at home: Activity Return to your normal activities as told by your health care provider. Ask your health care provider what activities are safe for you. Rest when you are having a flare. Start an exercise program as told by your health care provider. General instructions Keep all follow-up visits as told by your health care provider. This is important. Take over-the-counter and prescription medicines only as told by your health care provider. Where to find more information [employer]: www.rheumatology.org Arthritis Foundation: www.arthritis.org Contact a health care provider if: You have a flare-up of RA symptoms. You have a fever. You have side effects from your medicines. Get help right away if: You have chest pain. You have trouble breathing. You quickly develop a hot, painful joint that  is more severe than your usual joint aches. Summary Rheumatoid arthritis (RA) is a long-term (chronic) disease that causes inflammation in your joints. RA is an autoimmune disease. The goals of treatment are to relieve pain, reduce inflammation, and slow down or stop joint damage and disability. This information is not intended to replace advice given to you by your health care provider. Make sure you discuss any questions you have with your healthcare provider. Document Revised: 05/30/2019 Document Reviewed: 07/19/2018 Elsevier Patient Education  2022 Elsevier Inc.  Vaccines You are taking a medication(s) that can suppress your immune system.  The following immunizations are recommended: Flu annually Covid-19  Td/Tdap (tetanus, diphtheria, pertussis) every 10 years Pneumonia (Prevnar 15 then Pneumovax 23 at least 1 year apart.  Alternatively, can take Prevnar 20 without needing additional dose) Shingrix (after age 27): 2 doses from 4 weeks to 6 months apart  Please check with your PCP to make sure you are up to date.

## 2021-06-18 NOTE — Progress Notes (Signed)
Please add a hepatitis C RNA quant.  If the lab cannot be added then have patient come in for the labs.

## 2021-06-24 ENCOUNTER — Telehealth: Payer: Self-pay

## 2021-06-24 ENCOUNTER — Other Ambulatory Visit (HOSPITAL_COMMUNITY): Payer: Self-pay

## 2021-06-24 ENCOUNTER — Telehealth: Payer: Self-pay | Admitting: Pharmacist

## 2021-06-24 LAB — CBC WITH DIFFERENTIAL/PLATELET
Absolute Monocytes: 486 cells/uL (ref 200–950)
Basophils Absolute: 57 cells/uL (ref 0–200)
Basophils Relative: 0.4 %
Eosinophils Absolute: 29 cells/uL (ref 15–500)
Eosinophils Relative: 0.2 %
HCT: 35.7 % (ref 35.0–45.0)
Hemoglobin: 11.2 g/dL — ABNORMAL LOW (ref 11.7–15.5)
Lymphs Abs: 1916 cells/uL (ref 850–3900)
MCH: 27.3 pg (ref 27.0–33.0)
MCHC: 31.4 g/dL — ABNORMAL LOW (ref 32.0–36.0)
MCV: 87.1 fL (ref 80.0–100.0)
MPV: 9.2 fL (ref 7.5–12.5)
Monocytes Relative: 3.4 %
Neutro Abs: 11812 cells/uL — ABNORMAL HIGH (ref 1500–7800)
Neutrophils Relative %: 82.6 %
Platelets: 387 10*3/uL (ref 140–400)
RBC: 4.1 10*6/uL (ref 3.80–5.10)
RDW: 15.9 % — ABNORMAL HIGH (ref 11.0–15.0)
Total Lymphocyte: 13.4 %
WBC: 14.3 10*3/uL — ABNORMAL HIGH (ref 3.8–10.8)

## 2021-06-24 LAB — HEPATITIS C ANTIBODY
Hepatitis C Ab: REACTIVE — AB
SIGNAL TO CUT-OFF: 2.4 — ABNORMAL HIGH (ref <1.00)

## 2021-06-24 LAB — HEPATITIS B CORE ANTIBODY, IGM: Hep B C IgM: NONREACTIVE

## 2021-06-24 LAB — TEST AUTHORIZATION

## 2021-06-24 LAB — QUANTIFERON-TB GOLD PLUS
Mitogen-NIL: 2.78 IU/mL
NIL: 0.02 IU/mL
QuantiFERON-TB Gold Plus: NEGATIVE
TB1-NIL: 0.02 IU/mL
TB2-NIL: 0.01 IU/mL

## 2021-06-24 LAB — TSH: TSH: 1.3 mIU/L

## 2021-06-24 LAB — HEPATITIS C RNA QUANTITATIVE
HCV Quantitative Log: <1.18 Log IU/mL
HCV Quantitative Log: <1.18 log IU/mL
HCV RNA, PCR, QN: <15 IU/mL
HCV RNA, PCR, QN: <15 IU/mL

## 2021-06-24 LAB — HEPATITIS B SURFACE ANTIGEN: Hepatitis B Surface Ag: NONREACTIVE

## 2021-06-24 LAB — PROTEIN ELECTROPHORESIS, SERUM, WITH REFLEX
Albumin ELP: 3.4 g/dL — ABNORMAL LOW (ref 3.8–4.8)
Alpha 1: 0.4 g/dL — ABNORMAL HIGH (ref 0.2–0.3)
Alpha 2: 1.1 g/dL — ABNORMAL HIGH (ref 0.5–0.9)
Beta 2: 0.6 g/dL — ABNORMAL HIGH (ref 0.2–0.5)
Beta Globulin: 0.5 g/dL (ref 0.4–0.6)
Gamma Globulin: 2.2 g/dL — ABNORMAL HIGH (ref 0.8–1.7)
Total Protein: 8.4 g/dL — ABNORMAL HIGH (ref 6.1–8.1)

## 2021-06-24 LAB — IGG, IGA, IGM
IgG (Immunoglobin G), Serum: 2691 mg/dL — ABNORMAL HIGH (ref 600–1640)
IgM, Serum: 145 mg/dL (ref 50–300)
Immunoglobulin A: 528 mg/dL — ABNORMAL HIGH (ref 47–310)

## 2021-06-24 LAB — HIV ANTIBODY (ROUTINE TESTING W REFLEX): HIV 1&2 Ab, 4th Generation: NONREACTIVE

## 2021-06-24 LAB — ANA: Anti Nuclear Antibody (ANA): NEGATIVE

## 2021-06-24 NOTE — Telephone Encounter (Signed)
Please start Enbrel Mini BIV. If possible, submit as urgent/expedited request as patient's disease is aggressive  Dose: 50mg  every 7 days  Dx: RA - M05.79  Previously tried therapies: naive to all therapies  , PharmD, MPH, BCPS Clinical Pharmacist (Rheumatology and Pulmonology)

## 2021-06-24 NOTE — Telephone Encounter (Signed)
Labs at front desk for patient to pick up.

## 2021-06-24 NOTE — Telephone Encounter (Signed)
Patient was unable to open email you sent. It was asking for a password, and she does not have My Chart. Patient request a copy to be printed, and she will pick it up at next appointment.

## 2021-06-24 NOTE — Progress Notes (Signed)
White cell count is elevated, anemia noted.  Thyroid function is normal, ANA is negative.  Hepatitis B and hepatitis C negative.  TB Gold is negative.  SPEP is normal.  Immunoglobulins elevated due to chronic inflammation.  HIV negative.  Devki, please check if we can get approval for Enbrel as she has aggressive erosive rheumatoid arthritis.

## 2021-06-24 NOTE — Telephone Encounter (Signed)
Lab results emailed to patient

## 2021-06-24 NOTE — Telephone Encounter (Signed)
Patient called stating she just spoke with somone from the office regarding her labwork results.  Patient requested a return call to let her know if the results can be emailed to her.

## 2021-06-25 ENCOUNTER — Other Ambulatory Visit (HOSPITAL_COMMUNITY): Payer: Self-pay

## 2021-06-25 NOTE — Telephone Encounter (Addendum)
Discussed Enbrel approval with patient and her husband including cost and initial visit. She expressed concern with starting injectable medication because of pharmacy coordination issues. She stated that mailing the medication was not an option for her since she is having issues with access to her mailbox at her current address. I advised that she can pick up the medication from [facility] which is located in [place]. She instead requested to fill at her local pharmacy that she uses for her other medications. She expressed further concern with med refills since the pharmacy may only fill one month at a time and would have to order Enbrel for her. I advised that she could sign up for auto refills with pharmacy so that pharmacy could order medication in advance and prevent interruption in therapy. She wanted to know if there were other therapy options available for her.  I reviewed that from Dr. Fatima Sanger clinical opinion, her RA is aggressive enough to warrant Enbrel monotherapy.  I briefly reviewed that in certain  patients whose disease is not so aggressive, methotrexate would be started initially which is tablet formulation.  At this time, she'd like to further discuss in person at f/u visit and is eager to start something long-term. I think she will ultimately benefit from auto-refills at local pharmacy and agree that she should continue to use local pharmacy rather than [facility] since she is currently unable to drive and her mailing address is not secure with shipments  She has f/u appt with Sherron Ales, PA-C on 07/10/21 in the afternoon. She requested that appt be r/s to morning time when pharmacist is on site so that she may start Enbrel at that visit if she feels comfortable after her discussion since transportation is a major concern for her.  [name], PharmD, MPH, BCPS Clinical Pharmacist (Rheumatology and Pulmonology)

## 2021-06-25 NOTE — Telephone Encounter (Signed)
Received notification from [facility] MEDICAID regarding a prior authorization for ENBREL. Authorization has been APPROVED from 06/25/2021 to 06/25/2022.   Patient can fill through [facility]: [phone]   Authorization # [id number]

## 2021-06-25 NOTE — Telephone Encounter (Signed)
Rescheduled to am appointment.

## 2021-06-26 ENCOUNTER — Other Ambulatory Visit (HOSPITAL_COMMUNITY): Payer: Self-pay

## 2021-06-27 ENCOUNTER — Telehealth: Payer: Self-pay | Admitting: Rheumatology

## 2021-06-27 NOTE — Progress Notes (Signed)
Office Visit Note  Patient: Natalie [name]             Date of Birth: 1990/12/31           MRN: [id number]             PCP: Westley Chandler, MD Referring: Westley Chandler, MD Visit Date: 07/10/2021 Occupation: @  Subjective:  Discuss enbrel   History of Present Illness: Natalie [name] is a 30 y.o. female with history of seropositive rheumatoid arthritis.  The patient was prescribed a prednisone taper at her initial office visit on 06/17/2021.  She has been taking prednisone 20 mg tapering by 5 mg every week and will be completing the taper tomorrow.  She has noticed a significant improvement in her joint pain and inflammation while taking prednisone.  She states that she has noticed increased appetite and weight gain while on the taper so she is ready to be off of prednisone.  She states that yesterday she had bilateral knee joint cortisone injections performed by Dr. Casimiro Needle pain management.  She denies any other joint pain or joint swelling at this time.  She is ready to proceed with Enbrel as previously discussed.     Activities of Daily Living:  Patient reports morning stiffness for all day. Patient Reports nocturnal pain.  Difficulty dressing/grooming: Reports Difficulty climbing stairs: Reports Difficulty getting out of chair: Reports Difficulty using hands for taps, buttons, cutlery, and/or writing: Reports  Review of Systems  Constitutional:  Positive for fatigue.  HENT:  Negative for mouth sores, mouth dryness and nose dryness.   Eyes:  Negative for pain, itching and dryness.  Respiratory:  Positive for shortness of breath and difficulty breathing.   Cardiovascular:  Negative for chest pain and palpitations.  Gastrointestinal:  Negative for blood in stool, constipation and diarrhea.  Endocrine: Positive for increased urination.  Genitourinary:  Negative for difficulty urinating.  Musculoskeletal:  Positive for joint pain, joint pain, myalgias, morning  stiffness, muscle tenderness and myalgias. Negative for joint swelling.  Skin:  Positive for color change. Negative for rash and redness.  Allergic/Immunologic: Negative for susceptible to infections.  Neurological:  Positive for weakness. Negative for dizziness, numbness, headaches and memory loss.  Hematological:  Negative for bruising/bleeding tendency.  Psychiatric/Behavioral:  Negative for confusion.    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   Lithium use 04/02/2021   Menorrhagia 12/05/2020   Bipolar affective disorder, current episode manic with psychotic symptoms (HCC) 11/08/2020   Pain in both lower extremities 10/15/2020   Encounter for initial prescription of injectable contraceptive 06/21/2020   Acute stress reaction 06/21/2020   Morbid obesity (HCC) 10/23/2019   Bipolar 2 disorder (HCC) 08/31/2014   OCD (obsessive compulsive disorder)     Past Medical History:  Diagnosis Date   Anxiety    Chronic bipolar disorder (HCC)    Depression    H/O suicide attempt 05-2013   OD   Obesity    OCD (obsessive compulsive disorder)    Psychiatric diagnosis     Family History  Problem Relation Age of Onset   Hypertension Mother    Depression Father    Hypertension Father    Bipolar disorder Father    Hypertension Maternal Grandmother    Obesity Maternal Grandmother    Arthritis Maternal Grandmother    Hypertension Paternal Grandmother    History reviewed. No pertinent surgical history. Social History   Social History Narrative  Lives with husband   Right handed   Drinks caffeine seldomly   Immunization History  Administered Date(s) Administered   PFIZER Comirnaty(Gray Top)Covid-19 Tri-Sucrose Vaccine 05/01/2021   PFIZER(Purple Top)SARS-COV-2 Vaccination 07/22/2020, 08/12/2020   Tdap 10/23/2019     Objective: Vital Signs: BP 131/74 (BP Location: Left Arm, Patient Position: Sitting, Cuff Size: Normal)    Pulse (!) 124   Resp 17   Ht 5\' 4"  (1.626 m)   Wt 190 lb (86.2 kg) Comment: per patient, patient is in a wheelchair  BMI 32.61 kg/m    Physical Exam Vitals and nursing note reviewed.  Constitutional:      Appearance: She is well-developed.  HENT:     Head: Normocephalic and atraumatic.  Eyes:     Conjunctiva/sclera: Conjunctivae normal.  Pulmonary:     Effort: Pulmonary effort is normal.  Abdominal:     Palpations: Abdomen is soft.  Musculoskeletal:     Cervical back: Normal range of motion.  Skin:    General: Skin is warm and dry.     Capillary Refill: Capillary refill takes less than 2 seconds.  Neurological:     Mental Status: She is alert and oriented to person, place, and time.  Psychiatric:        Behavior: Behavior normal.     Musculoskeletal Exam: Patient remained in wheelchair during examination.  C-spine good ROM.  Shoulder joints have good range of motion with no discomfort.  Mild elbow joint contractures noted.  No tenderness or inflammation of elbow joints.  Synovitis of both wrists noted. Tenderness and synovitis of several MCP joints as described below Hip joints difficult to assess well in seated position.  Warmth and swelling in both knee joints noted.  Painful and limited extension of the left knee.  Ankle joints have good range of motion with no tenderness or inflammation at this time.  CDAI Exam: CDAI Score: 18.6  Patient Global: 1 mm; Provider Global: 5 mm Swollen: 10 ; Tender: 8  Joint Exam 07/10/2021      Right  Left  Wrist  Swollen   Swollen   MCP 2  Swollen Tender  Swollen Tender  MCP 3  Swollen Tender  Swollen Tender  MCP 4  Swollen Tender     MCP 5  Swollen Tender     Knee  Swollen Tender  Swollen Tender     Investigation: No additional findings.  Imaging: DG Chest 2 View  Result Date: 07/05/2021 CLINICAL DATA:  Immunosuppressive therapy EXAM: CHEST - 2 VIEW COMPARISON:  None. FINDINGS: The heart size and mediastinal contours are  within normal limits. No focal airspace consolidation, pleural effusion, or pneumothorax. The visualized skeletal structures are unremarkable. IMPRESSION: No active cardiopulmonary disease. Electronically Signed   By: Duanne Guess D.O.   On: 07/05/2021 18:52   XR Foot 2 Views Left  Result Date: 06/17/2021 Juxta-articular osteopenia was noted.  Narrowing of all MTP joints, PIP and DIP joints was noted.  Intertarsal joint space narrowing was noted.  Posterior calcaneal spur was noted.  No erosive changes were noted. Impression: These findings are consistent with inflammatory arthritis and osteoarthritis overlap.  XR Foot 2 Views Right  Result Date: 06/17/2021 Juxta-articular osteopenia was noted.  Narrowing of all the MTP joints, PIP and DIP joints was noted.  Intertarsal narrowing was noted.  Posterior inferior calcaneal spurs were noted.  No erosive changes were noted. Impression: These findings are consistent with inflammatory arthritis and osteoarthritis overlap.   Recent Labs: Lab Results  Component Value Date   WBC 14.3 (H) 06/17/2021   HGB 11.2 (L) 06/17/2021   PLT 387 06/17/2021   NA 139 05/01/2021   K 4.0 05/01/2021   CL 104 05/01/2021   CO2 19 (L) 05/01/2021   GLUCOSE 94 05/01/2021   BUN 4 (L) 05/01/2021   CREATININE 0.53 (L) 05/01/2021   BILITOT 0.3 05/01/2021   ALKPHOS 94 05/01/2021   AST 15 05/01/2021   ALT 15 05/01/2021   PROT 8.4 (H) 06/17/2021   ALBUMIN 4.2 05/01/2021   CALCIUM 9.4 05/01/2021   GFRAA 150 07/22/2020   QFTBGOLDPLUS NEGATIVE 06/17/2021    Speciality Comments: No specialty comments available.  Procedures:  No procedures performed Allergies: Patient has no known allergies.   Assessment / Plan:     Visit Diagnoses: Rheumatoid arthritis with rheumatoid factor of multiple sites without organ or systems involvement (HCC) - Clinical and radiographic findings are consistent with erosive rheumatoid arthritis. RF+, Anti-CCP+: She has ongoing tenderness  and synovitis of multiple joints as described above.  The patient was diagnosed with erosive rheumatoid arthritis at her initial office visit on 06/17/2021.  She was prescribed a prednisone taper starting 20 mg tapering by 5 mg every week at her initial visit.  She has noticed a significant improvement in her joint pain and inflammation since starting on prednisone.  She continues to have persistent pain and swelling in bilateral knee joints.  She had cortisone injections in both knees performed yesterday by her pain management specialist, Dr. Casimiro Needle.  She denies any other joint pain this time but active synovitis was noted in multiple joints.  She presented today to discuss proceeding with Enbrel.  Baseline lab work obtained on 06/17/2021 was reviewed with the patient today in the office in detail.  All questions were addressed.  Indications, contraindications, and potential side effects of Enbrel were discussed today in detail.  All questions were addressed and consent was obtained.  She initiated therapy today while in the office and was monitored for a potential reaction.  She will follow-up in 6 to 8 weeks to assess her response.  Medication counseling:   TB Test: Negative on 06/17/21 Hepatitis panel: Hep B- and HCV RNA quant negative on 06/17/21 HIV: Negative on 06/17/21 SPEP: no abnormal proteins 06/17/21 Immunoglobulins: IgA and IgG are elevated. Chest x-ray: 07/04/21: No active cardiopulmonary disease.  Does patient have diagnosis of heart failure?  No  Counseled patient that Enbrel is a TNF blocking agent.  Reviewed Enbrel dose of 50 mg once weekly.  Counseled patient on purpose, proper use, and adverse effects of Enbrel.  Reviewed the most common adverse effects including infections, headache, and injection site reactions. Discussed that there is the possibility of an increased risk of malignancy but it is not well understood if this increased risk is due to the medication or the disease state.   Advised patient to get yearly dermatology exams due to risk of skin cancer.  Reviewed the importance of regular labs while on Enbrel therapy.  Advised patient to get standing labs one month after starting Enbrel then every 2 months.  Provided patient with standing lab orders.  Counseled patient that Enbrel should be held prior to scheduled surgery.  Counseled patient to avoid live vaccines while on Enbrel.  Advised patient to get annual influenza vaccine and the pneumococcal vaccine as needed.  Provided patient with medication education material and answered all questions.  Patient voiced understanding.  Patient consented to Enbrel.  Will upload consent into the  media tab.  Reviewed storage instructions for Enbrel.  Advised initial injection must be administered in office.  Patient voiced understanding.    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  No tenderness or inflammation on examination today.  High risk medication use: Patient was initiated on Enbrel today in the office.  She will continue on Enbrel 50 mg subcutaneous injections once weekly.  CBC updated on 06/17/2021.  CMP drawn on 05/01/2021.  HCV RNA quant negative on 06/17/21.  Hepatitis B negative on 06/17/2021.  TB Gold negative on 06/17/2021.  SPEP did not reveal any abnormal proteins.  She will return for lab work in 1 month and every 3 months to monitor for drug toxicity.  Standing orders for CBC and CMP placed today. Discussed the importance of holding Enbrel if she develops signs or symptoms of an infection and to resume once infection has completely cleared.  Primary osteoarthritis of both knees: Chronic pain.  X-rays of both knees were obtained in March 2022 which revealed bilateral severe compartmental narrowing and erosive changes.  She had bilateral moderate chondromalacia patella.  She had bilateral knee joint cortisone injections performed yesterday by her pain management specialist, Dr.  Casimiro Needle.  She uses a cane or Rollator walker to assist with ambulation.  Today she remained in the wheelchair during the exam.  She has painful range of motion of both knee joints with limited extension of the left knee.  Warmth and swelling of both knees noted.  Primary osteoarthritis of both feet: She is not experiencing any discomfort in her feet or ankle joints at this time.  X-rays of both feet were obtained on 06/17/2021 which were consistent with inflammatory arthritis and osteoarthritis overlap.  Chronic pain syndrome: Followed by Dr. Casimiro Needle at pain management.  Other medical conditions are listed as follows:   Bipolar affective disorder, current episode manic with psychotic symptoms (HCC)  Lithium use  Orders: Orders Placed This Encounter  Procedures   CBC with Differential/Platelet   COMPLETE METABOLIC PANEL WITH GFR   Ambulatory referral to Dermatology    Meds ordered this encounter  Medications   Etanercept (ENBREL MINI) 50 MG/ML SOCT    Sig: Inject 50 mg into the skin once a week.    Dispense:  12 mL    Refill:  0    First dose in clinic on 07/10/21. Patient due 07/17/21       Follow-Up Instructions: Return for Rheumatoid arthritis.   Gearldine Bienenstock, PA-C  Note - This record has been created using Dragon software.  Chart creation errors have been sought, but may not always  have been located. Such creation errors do not reflect on  the standard of medical care.

## 2021-06-27 NOTE — Telephone Encounter (Signed)
Patient has decided she wants to try Enbrel. Patient requests you give her a call to discuss.

## 2021-06-30 ENCOUNTER — Telehealth: Payer: Self-pay

## 2021-06-30 NOTE — Telephone Encounter (Signed)
Patient called requesting to speak with [facility] regarding the long term effects of Enbrel when she returns to the office on Thursday, 07/03/21.

## 2021-07-01 NOTE — Telephone Encounter (Signed)
Patient decided she wants to try Enbrel.

## 2021-07-02 ENCOUNTER — Ambulatory Visit (INDEPENDENT_AMBULATORY_CARE_PROVIDER_SITE_OTHER): Payer: Medicaid Other

## 2021-07-02 ENCOUNTER — Ambulatory Visit: Payer: Medicaid Other

## 2021-07-02 ENCOUNTER — Other Ambulatory Visit: Payer: Self-pay

## 2021-07-02 DIAGNOSIS — Z3042 Encounter for surveillance of injectable contraceptive: Secondary | ICD-10-CM | POA: Diagnosis present

## 2021-07-02 MED ORDER — MEDROXYPROGESTERONE ACETATE 150 MG/ML IM SUSP
150.0000 mg | Freq: Once | INTRAMUSCULAR | Status: AC
Start: 2021-07-02 — End: 2021-07-02
  Administered 2021-07-02: 150 mg via INTRAMUSCULAR

## 2021-07-02 NOTE — Progress Notes (Signed)
Patient here today for Depo Provera injection and is within her dates.    Last contraceptive appt was 04/02/2021  Depo given in LD today, upon patient's request.  Site unremarkable & patient tolerated injection.    Next injection due 10/19-11/2.  Reminder card given.    Veronda Prude, RN

## 2021-07-04 ENCOUNTER — Ambulatory Visit (HOSPITAL_COMMUNITY)
Admission: RE | Admit: 2021-07-04 | Discharge: 2021-07-04 | Disposition: A | Discharge status: Home / Self Care | Payer: Medicaid Other | Source: Ambulatory Visit | Attending: Rheumatology | Admitting: Rheumatology

## 2021-07-04 ENCOUNTER — Other Ambulatory Visit: Payer: Self-pay

## 2021-07-04 ENCOUNTER — Telehealth: Payer: Self-pay | Admitting: Rheumatology

## 2021-07-04 DIAGNOSIS — Z79899 Other long term (current) drug therapy: Secondary | ICD-10-CM | POA: Insufficient documentation

## 2021-07-04 NOTE — Telephone Encounter (Signed)
Patient called to let you know she had the chest xray today, and wanted you to look at the report. Patient also wanted to see if she has done all the tests doctor wanted her to have because she cannot keep running back and forth for all these tests.

## 2021-07-04 NOTE — Telephone Encounter (Signed)
Returned call to patient. She states she is onboard with starting Enbrel. I've advised that we will review in detail at her appointment on 07/10/21 with Sherron Ales, PA-C and if she still feels comfortable starting, we will plan for first dose at that visit. I've emailed her information about Enbrel to review prior to that appointment  [name], PharmD, MPH, BCPS Clinical Pharmacist (Rheumatology and Pulmonology)

## 2021-07-04 NOTE — Telephone Encounter (Signed)
Returned call to patient. She states she is onboard with starting Enbrel. I've advised that we will review in detail at her appointment on 07/10/21 with Sherron Ales, PA-C and if she still feels comfortable starting, we will plan for first dose at that visit.  [name], PharmD, MPH, BCPS Clinical Pharmacist (Rheumatology and Pulmonology)

## 2021-07-05 NOTE — Progress Notes (Signed)
Chest xray is normal

## 2021-07-07 NOTE — Telephone Encounter (Signed)
CXR did not reveal any active cardiopulmonary disease.

## 2021-07-07 NOTE — Telephone Encounter (Signed)
Patient received call from Jasmine December C to discuss x-ray results.

## 2021-07-08 ENCOUNTER — Telehealth: Payer: Self-pay

## 2021-07-08 NOTE — Telephone Encounter (Signed)
Pt calling in stating it will be difficult for her to make it to the office on 07/18/21. Pt is asking if it can be switched to a virtual appointment appt with Dr. Manson Passey?

## 2021-07-08 NOTE — Telephone Encounter (Signed)
Patient needs blood work and should be in person. I can make another day work if she needs. Please call and ask patient to please try to come in person.

## 2021-07-09 ENCOUNTER — Telehealth: Payer: Self-pay | Admitting: *Deleted

## 2021-07-09 NOTE — Telephone Encounter (Signed)
Called patient to schedule appointment for lab work.  Patient is not understanding why she needs to have blood drawn and wants to know what doctor is looking for.  Patient has an appointment in September but would like to speak to Dr. Manson Passey prior to appointment.  Glennie Hawk, CMA

## 2021-07-09 NOTE — Telephone Encounter (Signed)
Patient left message stating she is on Prednisone and due to start Enbrel in office tomorrow. Patient wanted to know if she should take her dose of Prednisone in the morning prior to her appointment. Patient advised to take Prednisone as prescribed. Patient expressed understanding.

## 2021-07-09 NOTE — Telephone Encounter (Signed)
Called patient. Patient is doing well overall, doing well on perdnisone, planning to start DMARD later this week.   All questions answered, will obtain labs at follow up in October.  [name], MD  Family Medicine Teaching Service

## 2021-07-10 ENCOUNTER — Encounter: Payer: Self-pay | Admitting: Physician Assistant

## 2021-07-10 ENCOUNTER — Other Ambulatory Visit: Payer: Self-pay

## 2021-07-10 ENCOUNTER — Ambulatory Visit: Payer: Medicaid Other | Admitting: Physician Assistant

## 2021-07-10 VITALS — BP 131/74 | HR 124 | Resp 17 | Ht 64.0 in | Wt 190.0 lb

## 2021-07-10 DIAGNOSIS — G894 Chronic pain syndrome: Secondary | ICD-10-CM

## 2021-07-10 DIAGNOSIS — Z79899 Other long term (current) drug therapy: Secondary | ICD-10-CM

## 2021-07-10 DIAGNOSIS — M170 Bilateral primary osteoarthritis of knee: Secondary | ICD-10-CM | POA: Diagnosis not present

## 2021-07-10 DIAGNOSIS — M0579 Rheumatoid arthritis with rheumatoid factor of multiple sites without organ or systems involvement: Secondary | ICD-10-CM | POA: Diagnosis not present

## 2021-07-10 DIAGNOSIS — M24521 Contracture, right elbow: Secondary | ICD-10-CM | POA: Diagnosis not present

## 2021-07-10 DIAGNOSIS — M24522 Contracture, left elbow: Secondary | ICD-10-CM

## 2021-07-10 DIAGNOSIS — F312 Bipolar disorder, current episode manic severe with psychotic features: Secondary | ICD-10-CM

## 2021-07-10 MED ORDER — ENBREL MINI 50 MG/ML ~~LOC~~ SOCT: 50.0000 mg | SUBCUTANEOUS | 0 refills | Status: DC

## 2021-07-10 NOTE — Progress Notes (Signed)
Pharmacy Note  Subjective:   Patient presents to clinic today with her spouse to receive first dose of Enbrel. She is naive to biologics DMARDs and non-biologic DMARDs. She is completing a prednisone taper.  Patient running a fever or have signs/symptoms of infection? No  Patient currently on antibiotics for the treatment of infection? No  Patient have any upcoming invasive procedures/surgeries? No  Objective: CMP     Component Value Date/Time   NA 139 05/01/2021 1454   NA 138 11/04/2014 0604   K 4.0 05/01/2021 1454   K 4.4 11/04/2014 0604   CL 104 05/01/2021 1454   CL 103 11/04/2014 0604   CO2 19 (L) 05/01/2021 1454   CO2 26 11/04/2014 0604   GLUCOSE 94 05/01/2021 1454   GLUCOSE 94 11/06/2020 1314   GLUCOSE 98 11/04/2014 0604   BUN 4 (L) 05/01/2021 1454   BUN 11 11/04/2014 0604   CREATININE 0.53 (L) 05/01/2021 1454   CREATININE 0.68 11/04/2014 0604   CALCIUM 9.4 05/01/2021 1454   CALCIUM 9.2 11/04/2014 0604   PROT 8.4 (H) 06/17/2021 1106   PROT 8.9 (H) 05/01/2021 1454   ALBUMIN 4.2 05/01/2021 1454   AST 15 05/01/2021 1454   ALT 15 05/01/2021 1454   ALKPHOS 94 05/01/2021 1454   BILITOT 0.3 05/01/2021 1454   GFRNONAA >60 11/06/2020 1314   GFRNONAA >60 11/04/2014 0604   GFRAA 150 07/22/2020 1140   GFRAA >60 11/04/2014 0604    CBC    Component Value Date/Time   WBC 14.3 (H) 06/17/2021 1106   RBC 4.10 06/17/2021 1106   HGB 11.2 (L) 06/17/2021 1106   HGB 10.3 (L) 07/22/2020 1140   HCT 35.7 06/17/2021 1106   HCT 32.0 (L) 07/22/2020 1140   PLT 387 06/17/2021 1106   PLT 315 07/22/2020 1140   MCV 87.1 06/17/2021 1106   MCV 88 07/22/2020 1140   MCV 100 10/28/2014 1250   MCH 27.3 06/17/2021 1106   MCHC 31.4 (L) 06/17/2021 1106   RDW 15.9 (H) 06/17/2021 1106   RDW 14.4 07/22/2020 1140   RDW 13.0 10/28/2014 1250   LYMPHSABS 1,916 06/17/2021 1106   LYMPHSABS 1.4 10/28/2014 1250   MONOABS 0.5 11/06/2020 1314   MONOABS 0.3 10/28/2014 1250   EOSABS 29 06/17/2021  1106   EOSABS 0.1 10/28/2014 1250   BASOSABS 57 06/17/2021 1106   BASOSABS 0.1 10/28/2014 1250    Baseline Immunosuppressant Therapy Labs TB GOLD Quantiferon TB Gold Latest Ref Rng & Units 06/17/2021  Quantiferon TB Gold Plus NEGATIVE NEGATIVE   Hepatitis Panel Hepatitis Latest Ref Rng & Units 06/17/2021  Hep B Surface Ag NON-REACTIVE NON-REACTIVE  Hep B IgM NON-REACTIVE NON-REACTIVE  Hep C Ab NON-REACTIVE REACTIVE(A)  Hep C Ab NON-REACTIVE REACTIVE(A)   HIV Lab Results  Component Value Date   HIV NON-REACTIVE 06/17/2021   HIV NON REACTIVE 02/14/2014   Immunoglobulins Immunoglobulin Electrophoresis Latest Ref Rng & Units 06/17/2021  IgA  47 - 310 mg/dL 109(N)  IgG 235 - 5,732 mg/dL 2,025(K)  IgM 50 - 270 mg/dL 623   SPEP Serum Protein Electrophoresis Latest Ref Rng & Units 06/17/2021  Total Protein 6.1 - 8.1 g/dL 7.6(E)  Albumin 3.8 - 4.8 g/dL 8.3(T)  Alpha-1 0.2 - 0.3 g/dL 5.1(V)  Alpha-2 0.5 - 0.9 g/dL 6.1(Y)  Beta Globulin 0.4 - 0.6 g/dL 0.5  Beta 2 0.2 - 0.5 g/dL 0.7(P)  Gamma Globulin 0.8 - 1.7 g/dL 2.2(H)   Chest x-ray: 07/04/21 no active cardiopulmonary disease  Assessment/Plan:  Counseled patient that Enbrel is a TNF blocking agent.  Counseled patient on purpose, proper use, and adverse effects of Enbrel.  Reviewed the most common adverse effects including infections, headache, and injection site reactions.  Discussed that there is the possibility of an increased risk of malignancy including non-melanoma skin cancer but it is not well understood if this increased risk is due to the medication or the disease state.  Advised patient to get yearly dermatology exams due to risk of skin cancer.  Counseled patient that Enbrel should be held prior to scheduled surgery.  Counseled patient to avoid live vaccines while on Enbrel.  Recommend annual influenza, PCV 15 or PCV20 or Pneumovax 23, and Shingrix as indicated.  She has received 3 COVID19 vaccines. She is using Depo shot  for contraception. She has been advised to notify our clinic if she is planning to be or becomes pregnant as this may warrant change in therapy.  Reviewed the importance of regular labs while on Enbrel therapy.  Will monitor CBC and CMP 1 month after starting and then every 3 months routinely thereafter. Will monitor TB gold annually. Standing orders placed today Provided patient with medication education material and answered all questions.  Patient consented to Enbrel.  Will upload consent into the media tab.  Reviewed storage instructions for Enbrel.    Dermatology referral was placed today.  Demonstrated proper injection technique with Enbrel demo device  Patient able to demonstrate proper injection technique using the teach back method.  Patient self injected in the right abdomen with:  Sample Medication: Enbrel Mini prefilled cartridge NDC: [address]-93 Lot: [id number] Expiration: 10/2023  Enbrel Autotouch Injector Device: NDC: [address]-35 Lot: [id number] Expiration: 01/28/24  Patient tolerated well.  Observed for 30 mins in office for adverse reaction and none noted.   Patient is to return in 1 month for labs and 6-8 weeks for follow-up appointment.  Standing orders placed.   Enbrel approved through insurance [facility]).   Rx sent to: local [facility]. Patient has been advised to sign up for auto-refills with [facility]  All questions encouraged and answered.  Instructed patient to call with any further questions or concerns.  [name], PharmD, MPH, BCPS Clinical Pharmacist (Rheumatology and Pulmonology)  07/10/2021 8:36 AM

## 2021-07-10 NOTE — Patient Instructions (Addendum)
Your next Enbrel dose is due on 8/18, 8/25, and every Thursday thereafter.  Your prescription was sent to your local [facility] pharmacy.  Please sign up for auto-refills with [facility] so there is no delay in filling it when you are due.  Labs are due in 1 month then every 3 months. Lab hours are from Monday to Thursday 1:30-4:30pm and Friday 1:30-4pm. You do not need an appointment if you come for labs during these times.  How to manage an injection site reaction: Remember the 5 C's: COUNTER - leave on the counter at least 30 minutes but up to overnight to bring medication to room temperature. This may help prevent stinging COLD - place something cold (like an ice gel pack or cold water bottle) on the injection site just before cleansing with alcohol. This may help reduce pain CLARITIN - use Claritin (generic name is loratadine) for the first two weeks of treatment or the day of, the day before, and the day after injecting. This will help to minimize injection site reactions CORTISONE CREAM - apply if injection site is irritated and itching CALL ME - if injection site reaction is bigger than the size of your fist, looks infected, blisters, or if you develop hives  Etanercept Injection What is this medication? ETANERCEPT (et a [employer]) is used for the treatment of rheumatoid arthritis. The medicine is also used to treat psoriatic arthritis, ankylosing spondylitis,and psoriasis. This medicine may be used for other purposes; ask your health care provider orpharmacist if you have questions. COMMON BRAND NAME(S): Enbrel What should I tell my care team before I take this medication? They need to know if you have any of these conditions: bleeding disorder cancer diabetes granulomatosis with polyangiitis heart failure HIV or AIDs immune system problems infection such as tuberculosis (TB) or other bacterial, fungal or viral infections liver disease nervous system problems such as Guillain-Barre  syndrome, multiple sclerosis or seizures recent or upcoming vaccine an unusual or allergic reaction to etanercept, other medicines, latex, rubber, food, dyes, or preservatives pregnant or trying to get pregnant breast-feeding How should I use this medication? The medicine is injected under the skin. You will be taught how to prepare and give it. Take it as directed on the prescription label. Keep taking it unlessyour health care provider tells you stop. This medicine comes with INSTRUCTIONS FOR USE. Ask your pharmacist for directions on how to use this medicine. Read the information carefully. Talk toyour pharmacist or health care provider if you have questions. If you use a pen, be sure to take off the outer needle cover before using thedose. It is important that you put your used needles and syringes in a special sharps container. Do not put them in a trash can. If you do not have a sharpscontainer, call your pharmacist or health care provider to get one. A special MedGuide will be given to you by the pharmacist with eachprescription and refill. Be sure to read this information carefully each time. Talk to your health care provider about the use of this medicine in children. While it may be prescribed for children as young as 55 years of age for selectedconditions, precautions do apply. Overdosage: If you think you have taken too much of this medicine contact apoison control center or emergency room at once. NOTE: This medicine is only for you. Do not share this medicine with others. What if I miss a dose? If you miss a dose, take it as soon as you can. If  it is almost time for yournext dose, take only that dose. Do not take double or extra doses. What may interact with this medication? Do not take this medicine with any of the following medications: biologic medicines such as adalimumab, certolizumab, golimumab, infliximab live vaccines rilonacept This medicine may also interact with the  following medications: abatacept anakinra biologic medicines such as anifrolumab, baricitinib, belimumab, canakinumab, natalizumab, rituximab, sarilumab, tocilizumab, tofacitinib, upadacitinib, vedolizumab cyclophosphamide sulfasalazine This list may not describe all possible interactions. Give your health care provider a list of all the medicines, herbs, non-prescription drugs, or dietary supplements you use. Also tell them if you smoke, drink alcohol, or use illegaldrugs. Some items may interact with your medicine. What should I watch for while using this medication? Visit your health care provider for regular checks on your progress. Tell your health care provider if your symptoms do not start to get better or if they getworse. This medicine may increase your risk of getting an infection. Call your health care provider for advice if you get a fever, chills, sore throat, or other symptoms of a cold or flu. Do not treat yourself. Try to avoid being around people who are sick. If you have not had the measles or chickenpox vaccines, tell your health care provider right away if you are around someone with theseviruses. You will be tested for tuberculosis (TB) before you start this medicine. If your doctor prescribes any medicine for TB, you should start taking the TB medicine before starting this medicine. Make sure to finish the full course ofTB medicine. Avoid taking medicines that contain aspirin, acetaminophen, ibuprofen, naproxen, or ketoprofen unless instructed by your health care provider. Thesemedicines may hide fever. Talk to your health care provider about your risk of cancer. You may be more atrisk for certain types of cancer if you take this medicine. This medicine can decrease the response to a vaccine. If you need to get vaccinated, tell your health care provider if you have received this medicine. Extra booster doses may be needed. Talk to your health care provider to see ifa different  vaccination schedule is needed. What side effects may I notice from receiving this medication? Side effects that you should report to your health care provider as soon aspossible: allergic reactions (skin rash, itching or hives; swelling of the face, lips, or tongue) changes in vision dizziness heart failure (trouble breathing; fast, irregular heartbeat; sudden weight gain; swelling of the ankles, feet, hands; unusually weak or tired) infection (fever, chills, cough, sore throat, pain or trouble passing urine) light-colored stools liver injury (dark yellow or brown urine; general ill feeling or flu-like symptoms; loss of appetite, right upper belly pain; unusually weak or tired, yellowing of the eyes or skin) low red blood cell counts (trouble breathing; feeling faint; lightheaded, falls; unusually weak or tired) pain, tingling, numbness in the hands or feet red, scaly patches or raised bumps on the skin seizures unusual bruising or bleeding weakness in the arms or legs Side effects that usually do not require medical attention (report to yourhealth care provider if they continue or are bothersome): headache nausea pain, redness, or irritation at site where injected vomiting This list may not describe all possible side effects. Call your doctor for medical advice about side effects. You may report side effects to FDA at1-800-FDA-1088. Where should I keep my medication? Keep out of the reach of children and pets. See product for storage information. Each product may have differentinstructions. Get rid of any unused medicine after  the expiration date. To get rid of medicines that are no longer needed or have expired: Take the medicine to a medicine take-back program. Check with your pharmacy or law enforcement to find a location. If you cannot return the medicine, ask your pharmacist or health care provider how to get rid of this medicine safely. NOTE: This sheet is a summary. It may not  cover all possible information. If you have questions about this medicine, talk to your doctor, pharmacist, orhealth care provider.  2022 Elsevier/Gold Standard (2020-08-30 12:59:29)

## 2021-07-17 ENCOUNTER — Ambulatory Visit: Payer: Medicaid Other | Admitting: Rheumatology

## 2021-07-18 ENCOUNTER — Telehealth: Payer: Self-pay | Admitting: *Deleted

## 2021-07-18 ENCOUNTER — Ambulatory Visit: Payer: Medicaid Other | Admitting: Family Medicine

## 2021-07-18 NOTE — Telephone Encounter (Signed)
Patient contacted the office stating she was recently started on Enbrel. Patient states she gave herself the Enbrel injection on 07/17/2021. Patient states at the time of the injection she did have a little bleeding which stopped quickly. Patient states when she woke up the morning she had a bruise. Patient advised this could have come from how firmly she may have been hold the device to her injection site. Patient advised she could trying using some ice prior to her next injection. Patient expressed understanding.

## 2021-08-08 ENCOUNTER — Telehealth: Payer: Self-pay | Admitting: Family Medicine

## 2021-08-08 ENCOUNTER — Other Ambulatory Visit: Payer: Self-pay

## 2021-08-08 ENCOUNTER — Encounter: Payer: Medicaid Other | Admitting: Family Medicine

## 2021-08-08 DIAGNOSIS — Z5982 Transportation insecurity: Secondary | ICD-10-CM

## 2021-08-08 DIAGNOSIS — Z9189 Other specified personal risk factors, not elsewhere classified: Secondary | ICD-10-CM

## 2021-08-08 NOTE — Progress Notes (Addendum)
Attempted to call patient X2. Unable to reach--phone appears to be off. Will try again at 1130.   [name], MD  Family Medicine Teaching Service

## 2021-08-08 NOTE — Telephone Encounter (Addendum)
Called patient to follow-up on missed and rescheduled visit.  We discussed the need for hepatitis A serologies, testing hepatitis B immunity as well as repeating hepatitis C viral load to ensure this was not early infection detected.  We discussed this in great detail and she was able to repeat this back in general terms to me.  The patient reports she recently started Enbrel.  She is interested in getting her COVID booster and flu vaccine and will message pharmacy team to determine optimal timing for this.  The patient reports she is amenable to coming in for a visit but is having difficulty with transportation.  Referral to chronic care management placed.  Medications were updated and reviewed.  She is no longer on prednisone.  We will follow-up with patient after discussion with pharmacy team.  [name], MD  [facility] Medicine Teaching Service

## 2021-08-08 NOTE — Addendum Note (Signed)
Addended by: Manson Passey, Hawken Bielby on: 08/08/2021 04:03 PM   Modules accepted: Orders

## 2021-08-11 ENCOUNTER — Telehealth: Payer: Self-pay | Admitting: *Deleted

## 2021-08-11 ENCOUNTER — Telehealth: Payer: Self-pay | Admitting: Family Medicine

## 2021-08-11 NOTE — Chronic Care Management (AMB) (Signed)
  Care Management   Note  08/11/2021 Name: Natalie [name] MRN: [id number] DOB: Mar 22, 1991  Natalie [name] is a 30 y.o. year old female who is a primary care patient of Westley Chandler, MD. I reached out to [name] by phone today in response to a referral sent by Ms. Monica Martinez Watkinson's PCP, Dr. Manson Passey..    Ms. Dowding was given information about care management services today including:  Care management services include personalized support from designated clinical staff supervised by her physician, including individualized plan of care and coordination with other care providers 24/7 contact phone numbers for assistance for urgent and routine care needs. The patient may stop care management services at any time by phone call to the office staff.  Patient agreed to services and verbal consent obtained.   Follow up plan: Telephone appointment with care management team member scheduled for:08/18/21  [facility] Guide, Embedded Care Coordination [facility]  Care Management  Direct Dial: [phone]

## 2021-08-11 NOTE — Telephone Encounter (Signed)
Called patient to follow-up on vaccine recommendations.  We will administer her flu and COVID-vaccine at her upcoming appointment.  She would also like her lithium level checked at this appointment.

## 2021-08-11 NOTE — Chronic Care Management (AMB) (Signed)
  Care Management   Outreach Note  08/11/2021 Name: Natalie [name] MRN: [id number] DOB: [birth year]  Referred by: Westley Chandler, MD Reason for referral : Care Coordination (Initial outreach to schedule referral with [facility] )   An unsuccessful telephone outreach was attempted today. The patient was referred to the case management team for assistance with care management and care coordination.   Follow Up Plan:  A HIPAA compliant phone message was left for the patient providing contact information and requesting a return call. The care management team will reach out to the patient again over the next 7 days.  If patient returns call to provider office, please advise to call Embedded Care Management Care Guide Misty Stanley at [phone].  Gwenevere Ghazi  Care Guide, Embedded Care Coordination [facility] Management  Direct Dial: [phone]

## 2021-08-12 ENCOUNTER — Telehealth: Payer: Self-pay | Admitting: Rheumatology

## 2021-08-12 NOTE — Telephone Encounter (Signed)
Attempted to contact the patient and left message for patient to call the office.  

## 2021-08-12 NOTE — Telephone Encounter (Signed)
Patient calling to let you know she has developed a raised rash across her stomach. Spots are very itchy, and red. Patient thinks it maybe due to her Enbrel, but isn't due for next dose until Thursday. [facility]. Please call to advise as patient does not have transportation, or money to come in for an appointment.

## 2021-08-13 NOTE — Telephone Encounter (Signed)
Patient called to advise [place] she has developed a raised rash across her stomach. Spots are very itchy, and red. Patient states it looked like her stretch marks were inflamed. Patient states she first noticed it yesterday. Patient states after a nap later yesterday evening the rash had gone away. Patient state her last Enbrel injection was on 08/07/2021. Patient advised if she were to have an injection site reaction she may use Benadryl and Zyrtec. Patient encouraged to reach out to her PCP and/or dermatology as the rash was across her entire stomach. Patient expressed understanding.

## 2021-08-14 NOTE — Progress Notes (Deleted)
Office Visit Note  Patient: Natalie [name]             Date of Birth: 1990-12-20           MRN: [id number]             PCP: Westley Chandler, MD Referring: Westley Chandler, MD Visit Date: 08/28/2021 Occupation: @GUAROCC @  Subjective:  No chief complaint on file.   History of Present Illness: Natalie [name] is a 30 y.o. female ***   Activities of Daily Living:  Patient reports morning stiffness for *** {minute/hour:19697}.   Patient {ACTIONS;DENIES/REPORTS:21021675::"Denies"} nocturnal pain.  Difficulty dressing/grooming: {ACTIONS;DENIES/REPORTS:21021675::"Denies"} Difficulty climbing stairs: {ACTIONS;DENIES/REPORTS:21021675::"Denies"} Difficulty getting out of chair: {ACTIONS;DENIES/REPORTS:21021675::"Denies"} Difficulty using hands for taps, buttons, cutlery, and/or writing: {ACTIONS;DENIES/REPORTS:21021675::"Denies"}  No Rheumatology ROS completed.   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   Lithium use 04/02/2021   Menorrhagia 12/05/2020   Bipolar affective disorder, current episode manic with psychotic symptoms (HCC) 11/08/2020   Pain in both lower extremities 10/15/2020   Encounter for initial prescription of injectable contraceptive 06/21/2020   Acute stress reaction 06/21/2020   Morbid obesity (HCC) 10/23/2019   Bipolar 2 disorder (HCC) 08/31/2014   OCD (obsessive compulsive disorder)     Past Medical History:  Diagnosis Date   Anxiety    Chronic bipolar disorder (HCC)    Depression    H/O suicide attempt 05-2013   OD   Obesity    OCD (obsessive compulsive disorder)    Psychiatric diagnosis     Family History  Problem Relation Age of Onset   Hypertension Mother    Depression Father    Hypertension Father    Bipolar disorder Father    Hypertension Maternal Grandmother    Obesity Maternal Grandmother    Arthritis Maternal Grandmother    Hypertension Paternal Grandmother    No past  surgical history on file. Social History   Social History Narrative   Lives with husband   Right handed   Drinks caffeine seldomly   Immunization History  Administered Date(s) Administered   PFIZER Comirnaty(Gray Top)Covid-19 Tri-Sucrose Vaccine 05/01/2021   PFIZER(Purple Top)SARS-COV-2 Vaccination 07/22/2020, 08/12/2020   Tdap 10/23/2019     Objective: Vital Signs: There were no vitals taken for this visit.   Physical Exam   Musculoskeletal Exam: ***  CDAI Exam: CDAI Score: -- Patient Global: --; Provider Global: -- Swollen: --; Tender: -- Joint Exam 08/28/2021   No joint exam has been documented for this visit   There is currently no information documented on the homunculus. Go to the Rheumatology activity and complete the homunculus joint exam.  Investigation: No additional findings.  Imaging: No results found.  Recent Labs: Lab Results  Component Value Date   WBC 14.3 (H) 06/17/2021   HGB 11.2 (L) 06/17/2021   PLT 387 06/17/2021   NA 139 05/01/2021   K 4.0 05/01/2021   CL 104 05/01/2021   CO2 19 (L) 05/01/2021   GLUCOSE 94 05/01/2021   BUN 4 (L) 05/01/2021   CREATININE 0.53 (L) 05/01/2021   BILITOT 0.3 05/01/2021   ALKPHOS 94 05/01/2021   AST 15 05/01/2021   ALT 15 05/01/2021   PROT 8.4 (H) 06/17/2021   ALBUMIN 4.2 05/01/2021   CALCIUM 9.4 05/01/2021   GFRAA 150 07/22/2020   QFTBGOLDPLUS NEGATIVE 06/17/2021    Speciality Comments: No specialty comments available.  Procedures:  No procedures performed Allergies: Patient has no known allergies.  Assessment / Plan:     Visit Diagnoses: Rheumatoid arthritis with rheumatoid factor of multiple sites without organ or systems involvement (HCC)  Contracture of joint of both elbows  High risk medication use  Primary osteoarthritis of both knees  Chronic pain syndrome  Bipolar affective disorder, current episode manic with psychotic symptoms (HCC)  Lithium use  Orders: No orders of the  defined types were placed in this encounter.  No orders of the defined types were placed in this encounter.   Face-to-face time spent with patient was *** minutes. Greater than 50% of time was spent in counseling and coordination of care.  Follow-Up Instructions: No follow-ups on file.   Gearldine Bienenstock, PA-C  Note - This record has been created using Dragon software.  Chart creation errors have been sought, but may not always  have been located. Such creation errors do not reflect on  the standard of medical care.

## 2021-08-15 ENCOUNTER — Telehealth: Payer: Self-pay | Admitting: *Deleted

## 2021-08-15 MED ORDER — PREDNISONE 5 MG PO TABS: ORAL_TABLET | ORAL | 0 refills | Status: DC

## 2021-08-15 NOTE — Telephone Encounter (Signed)
Patient advised we typically would like to give enbrel up to 3 months to notice maximum improvements. Prescription sent in for prednisone 20 mg tapering by 5 mg every 4 days.  We will further evaluate at her upcoming follow up visit.

## 2021-08-15 NOTE — Telephone Encounter (Signed)
Patient contacted the office stating she started on Enbrel on 07/10/2021. Patient states she is concerned because she is getting soreness that turns into pain at night. Patient states the pain comes and goes. Patient states it last for about 4-5 hours and interrupts her sleep. Patient states she is taking Enbrel as prescribed. Please advise.

## 2021-08-15 NOTE — Addendum Note (Signed)
Addended by: Henriette Combs on: 08/15/2021 03:47 PM   Modules accepted: Orders

## 2021-08-15 NOTE — Addendum Note (Signed)
Addended by: Henriette Combs on: 08/15/2021 02:12 PM   Modules accepted: Orders

## 2021-08-15 NOTE — Telephone Encounter (Signed)
Verified verbal prescription with Dr. Pollyann Savoy to send in  prednisone 20 mg tapering by 5 mg every 4 days. Prescription sent to the pharmacy.

## 2021-08-18 ENCOUNTER — Telehealth: Payer: Self-pay

## 2021-08-18 ENCOUNTER — Ambulatory Visit: Payer: Medicaid Other

## 2021-08-18 NOTE — Telephone Encounter (Signed)
   Telephone encounter was:  Successful.  08/18/2021 Name: Natalie [name] MRN: [id number] DOB: 1991-11-27  Natalie [name] is a 30 y.o. year old female who is a primary care patient of Westley Chandler, MD . The community resource team was consulted for assistance with Transportation Needs   Care guide performed the following interventions: Spoke with patient about [facility] Transportation-[phone] 8am-5pm. Advised patient to call at 8am to give intake information and get transportation set-up.  Follow Up Plan:  No further follow up planned at this time. The patient has been provided with needed resources.  Annabell Oconnor, AAS Paralegal, [facility] Care Guide  Embedded Care Coordination [place]  Care Management  [address] [place], [place] [address] ??[email or web address]  ?? [phone]   [email or web address]

## 2021-08-19 ENCOUNTER — Telehealth: Payer: Self-pay

## 2021-08-19 ENCOUNTER — Telehealth: Payer: Self-pay | Admitting: Rheumatology

## 2021-08-19 ENCOUNTER — Encounter: Payer: Self-pay | Admitting: *Deleted

## 2021-08-19 NOTE — Telephone Encounter (Signed)
Patient calling in reference to transportation to and from our office. Transportation company needs a letter faxed to allow for her husband to come with her to her appointments. Transportation fax # [phone]. Transportation phone # [phone]. Please call patient with questions.

## 2021-08-19 NOTE — Telephone Encounter (Signed)
Opened in error

## 2021-08-19 NOTE — Telephone Encounter (Signed)
Okay to send a letter

## 2021-08-19 NOTE — Patient Instructions (Signed)
Natalie [name]  it was nice speaking with you. Please call me directly [phone] if you have questions about the goals we discussed.   Goals Addressed               This Visit's Progress     I need transportation (pt-stated)        Timeframe:  Long-Range Goal Priority:  High Start Date:   08/18/21                          Expected End Date:  10/29/21                    Patient Goals/Self-Care Activities: Patient will self administer medications as prescribed Patient will attend all scheduled provider appointments Patient will call pharmacy for medication refills Patient will call provider office for new concerns or questions   Patient will expect call from Careguides          Patient Care Plan: RN Care Manager     Problem Identified: Rheumatoid Arthritis      Goal: The patient will maintain and manage her symptoms by following  the docotrss plan, taking her medications, and keeping her appointments   Start Date: 08/18/2021  Expected End Date: 10/29/2021  Priority: High  Note:   Current Barriers:  Chronic pain r/t Rheumatoid Arthritis as evidenced by patient stating her pain level is at  a 8/10.  Care Coordination needs related to Transportation   RNCM Clinical Goal(s):  Patient will verbalize understanding of plan for management of pain by describing how unrelieved pain will be managed , demonstrate the ability to pace her activities and keep all appointments  through collaboration with RN Care manager, provider, and care team.   Interventions: 1:1 collaboration with primary care provider regarding development and update of comprehensive plan of care as evidenced by provider attestation and co-signature Inter-disciplinary care team collaboration (see longitudinal plan of care) Evaluation of current treatment plan related to  self management and patient's adherence to plan as established by provider   Pain:  (Status: New goal.) Pain assessment  performed Medications reviewed Reviewed provider established plan for pain management;- patient states that she was started on Enbrel Discussed importance of adherence to all scheduled medical appointments; Counseled on the importance of reporting any/all new or changed pain symptoms or management strategies to pain management provider; Assessed social determinant of health barriers; - she does not have transportation. A referral was placed to the care guides to help with transportation and she was notified that they will call her. Discussed with the patient about her fear of falling.  She walks with her rollator. Advised her that it is more stable and she needs to continue to use it.  To take her time as she walks be concise in her movements.  She verbalized understanding Discussed her appointments: the patient stated she has on the 29th of September Rheumatology, 09/01/21 Dr. Cherly Hensen with pain management.  09/05/21 Dr Manson Passey and she is going to call and make her appointment [facility] who provides her psychiatry needs. She said they will not make an appointment ahead of time. RNCM will call the patient in 2 weeks.  I am unable to send her any information at his time due to they don't have the key to their mail box at the apartment.  When they have it she will let me know and I will be able to send her some information.  Natalie [name] received Care Management services today:  Care Management services include personalized support from designated clinical staff supervised by her physician, including individualized plan of care and coordination with other care providers 24/7 contact [phone] for assistance for urgent and routine care needs. Care Management are voluntary services and be declined at any time by calling the office.  The patient verbalized understanding of instructions provided today and declined a print copy of patient instruction materials.    Follow Up Plan: Patient would like  continued follow-up.  CCM RNCM will outreach the patient within the next 2 weeks.  Patient will call office if needed prior to next encounter   [name], RN   [phone]

## 2021-08-19 NOTE — Chronic Care Management (AMB) (Signed)
Care Management    RN Visit Note  08/19/2021 Name: Natalie [name] MRN: [id number] DOB: 11/06/1991  Subjective: Natalie [name] is a 30 y.o. year old female who is a primary care patient of Natalie Chandler, MD. The care management team was consulted for assistance with disease management and care coordination needs.    Engaged with patient by telephone for initial visit in response to provider referral for case management and/or care coordination services.   Consent to Services:   Natalie [name] was given information about Care Management services today including:  Care Management services includes personalized support from designated clinical staff supervised by her physician, including individualized plan of care and coordination with other care providers 24/7 contact phone numbers for assistance for urgent and routine care needs. The patient may stop case management services at any time by phone call to the office staff.  Patient agreed to services and consent obtained.    Assessment:  The patient  is making progress with her Rheumatoid Arthritis . See Care Plan below for interventions and patient self-care actives. Follow up Plan: Patient would like continued follow-up.  CCM RNCM will outreach the patient within the next 2 weeks.  Patient will call office if needed prior to next encounter  Review of patient past medical history, allergies, medications, health status, including review of consultants reports, laboratory and other test data, was performed as part of comprehensive evaluation and provision of chronic care management services.   SDOH (Social Determinants of Health) assessments and interventions performed:  No  Care Plan  No Known Allergies  Outpatient Encounter Medications as of 08/18/2021  Medication Sig   Acetaminophen (TYLENOL PO) Take by mouth as needed.   benztropine (COGENTIN) 1 MG tablet Take 1 tablet (1 mg total) by mouth at bedtime. (Patient taking differently:  Take 1 mg by mouth in the morning and at bedtime.)   docusate sodium (COLACE) 100 MG capsule Take 1 capsule (100 mg total) by mouth 2 (two) times daily. For constipation   Etanercept (ENBREL MINI) 50 MG/ML SOCT Inject 50 mg into the skin once a week.   lithium carbonate (ESKALITH) 450 MG CR tablet Take 2 tablets (900 mg total) by mouth at bedtime.   medroxyPROGESTERone (DEPO-PROVERA) 150 MG/ML injection Inject 150 mg into the muscle every 3 (three) months.   mirtazapine (REMERON) 7.5 MG tablet Take 1 tablet (7.5 mg total) by mouth at bedtime as needed (insomnia).   Multiple Vitamins-Minerals (MULTIVITAMIN WOMEN PO) Take by mouth daily.   OLANZapine (ZYPREXA) 20 MG tablet Take 1 tablet (20 mg total) by mouth at bedtime.   predniSONE (DELTASONE) 5 MG tablet Take 4 tabs po x 4 days, 3  tabs po x 4 days, 2  tabs po x 4 days, 1  tab po x 4 days   tiZANidine (ZANAFLEX) 4 MG tablet Take 4 mg by mouth daily. (Patient not taking: No sig reported)   [DISCONTINUED] Oxcarbazepine (TRILEPTAL) 300 MG tablet Take 1 tablet (300 mg total) by mouth daily. For mood stabilization (Patient not taking: Reported on 06/21/2020)   [DISCONTINUED] Paliperidone Palmitate (INVEGA SUSTENNA IM) Inject 1 each into the muscle every 30 (thirty) days.   [DISCONTINUED] traZODone (DESYREL) 150 MG tablet Take 1 tablet (150 mg total) by mouth at bedtime. For sleep   No facility-administered encounter medications on file as of 08/18/2021.    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   Lithium use 04/02/2021   Menorrhagia  12/05/2020   Bipolar affective disorder, current episode manic with psychotic symptoms (HCC) 11/08/2020   Pain in both lower extremities 10/15/2020   Encounter for initial prescription of injectable contraceptive 06/21/2020   Acute stress reaction 06/21/2020   Morbid obesity (HCC) 10/23/2019   Bipolar 2 disorder (HCC) 08/31/2014   OCD (obsessive compulsive disorder)      Conditions to be addressed/monitored:  Rheumatoid Arthritis  Care Plan : RN Care Manager  Updates made by Natalie Fairly, RN since 08/19/2021 12:00 AM     Problem: Rheumatoid Arthritis      Goal: The patient will maintain and manage her symptoms by following  the docotrss plan, taking her medications, and keeping her appointments   Start Date: 08/18/2021  Expected End Date: 10/29/2021  Priority: High  Note:   Current Barriers:  Chronic pain r/t Rheumatoid Arthritis as evidenced by patient stating her pain level is at  a 8/10.  Care Coordination needs related to Transportation   RNCM Clinical Goal(s):  Patient will verbalize understanding of plan for management of pain by describing how unrelieved pain will be managed , demonstrate the ability to pace her activities and keep all appointments  through collaboration with RN Care manager, provider, and care team.   Interventions: 1:1 collaboration with primary care provider regarding development and update of comprehensive plan of care as evidenced by provider attestation and co-signature Inter-disciplinary care team collaboration (see longitudinal plan of care) Evaluation of current treatment plan related to  self management and patient's adherence to plan as established by provider   Pain:  (Status: New goal.) Pain assessment performed Medications reviewed Reviewed provider established plan for pain management;- patient states that she was started on Enbrel Discussed importance of adherence to all scheduled medical appointments; Counseled on the importance of reporting any/all new or changed pain symptoms or management strategies to pain management provider; Assessed social determinant of health barriers; - she does not have transportation. A referral was placed to the care guides to help with transportation and she was notified that they will call her. Discussed with the patient about her fear of falling.  She walks with her  rollator. Advised her that it is more stable and she needs to continue to use it.  To take her time as she walks be concise in her movements.  She verbalized understanding Discussed her appointments: the patient stated she has on the 29th of September Rheumatology, 09/01/21 Dr. Cherly Hensen with pain management.  09/05/21 Dr Manson Passey and she is going to call and make her appointment [facility] who provides her psychiatry needs. She said they will not make an appointment ahead of time. RNCM will call the patient in 2 weeks.  I am unable to send her any information at his time due to they don't have the key to their mail box at the apartment.  When they have it she will let me know and I will be able to send her some information.      Natalie Fairly RN, BSN, [facility] Care Management Coordinator [facility] Phone: [phone] I Fax: [phone]

## 2021-08-19 NOTE — Telephone Encounter (Signed)
Received call that patient would like escort available for transportation. Will ask Millicent about this. She reports has both [facility] and [employer] transportation set up.   Millicent---patient is worried transport will get confused--is it okay she has both for transport?  Any guidance on paperwork so her husband to escort her appreciated.  Thank you for helping her! [name], MD  Family Medicine Teaching Service

## 2021-08-19 NOTE — Telephone Encounter (Signed)
Letter faxed and patient advised

## 2021-08-19 NOTE — Telephone Encounter (Signed)
Patient calls nurse line requesting to speak to PCP only. Please advise.

## 2021-08-28 ENCOUNTER — Ambulatory Visit: Payer: Medicaid Other | Admitting: Physician Assistant

## 2021-08-28 DIAGNOSIS — M0579 Rheumatoid arthritis with rheumatoid factor of multiple sites without organ or systems involvement: Secondary | ICD-10-CM

## 2021-08-28 DIAGNOSIS — Z79899 Other long term (current) drug therapy: Secondary | ICD-10-CM

## 2021-08-28 DIAGNOSIS — M24521 Contracture, right elbow: Secondary | ICD-10-CM

## 2021-08-28 DIAGNOSIS — F312 Bipolar disorder, current episode manic severe with psychotic features: Secondary | ICD-10-CM

## 2021-08-28 DIAGNOSIS — M170 Bilateral primary osteoarthritis of knee: Secondary | ICD-10-CM

## 2021-08-28 DIAGNOSIS — G894 Chronic pain syndrome: Secondary | ICD-10-CM

## 2021-09-01 NOTE — Progress Notes (Deleted)
Office Visit Note  Patient: Natalie [name]             Date of Birth: [birth year]           MRN: [id number]             PCP: Westley Chandler, MD Referring: Westley Chandler, MD Visit Date: 09/15/2021 Occupation: @GUAROCC @  Subjective:  No chief complaint on file.   History of Present Illness: Natalie [name] is a 30 y.o. female ***   Activities of Daily Living:  Patient reports morning stiffness for *** {minute/hour:19697}.   Patient {ACTIONS;DENIES/REPORTS:21021675::"Denies"} nocturnal pain.  Difficulty dressing/grooming: {ACTIONS;DENIES/REPORTS:21021675::"Denies"} Difficulty climbing stairs: {ACTIONS;DENIES/REPORTS:21021675::"Denies"} Difficulty getting out of chair: {ACTIONS;DENIES/REPORTS:21021675::"Denies"} Difficulty using hands for taps, buttons, cutlery, and/or writing: {ACTIONS;DENIES/REPORTS:21021675::"Denies"}  No Rheumatology ROS completed.   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   Lithium use 04/02/2021   Menorrhagia 12/05/2020   Bipolar affective disorder, current episode manic with psychotic symptoms (HCC) 11/08/2020   Pain in both lower extremities 10/15/2020   Encounter for initial prescription of injectable contraceptive 06/21/2020   Acute stress reaction 06/21/2020   Morbid obesity (HCC) 10/23/2019   Bipolar 2 disorder (HCC) 08/31/2014   OCD (obsessive compulsive disorder)     Past Medical History:  Diagnosis Date   Anxiety    Chronic bipolar disorder (HCC)    Depression    H/O suicide attempt 05-2013   OD   Obesity    OCD (obsessive compulsive disorder)    Psychiatric diagnosis     Family History  Problem Relation Age of Onset   Hypertension Mother    Depression Father    Hypertension Father    Bipolar disorder Father    Hypertension Maternal Grandmother    Obesity Maternal Grandmother    Arthritis Maternal Grandmother    Hypertension Paternal Grandmother    No past  surgical history on file. Social History   Social History Narrative   Lives with husband   Right handed   Drinks caffeine seldomly   Immunization History  Administered Date(s) Administered   PFIZER Comirnaty(Gray Top)Covid-19 Tri-Sucrose Vaccine 05/01/2021   PFIZER(Purple Top)SARS-COV-2 Vaccination 07/22/2020, 08/12/2020   Tdap 10/23/2019     Objective: Vital Signs: There were no vitals taken for this visit.   Physical Exam   Musculoskeletal Exam: ***  CDAI Exam: CDAI Score: -- Patient Global: --; Provider Global: -- Swollen: --; Tender: -- Joint Exam 09/15/2021   No joint exam has been documented for this visit   There is currently no information documented on the homunculus. Go to the Rheumatology activity and complete the homunculus joint exam.  Investigation: No additional findings.  Imaging: No results found.  Recent Labs: Lab Results  Component Value Date   WBC 14.3 (H) 06/17/2021   HGB 11.2 (L) 06/17/2021   PLT 387 06/17/2021   NA 139 05/01/2021   K 4.0 05/01/2021   CL 104 05/01/2021   CO2 19 (L) 05/01/2021   GLUCOSE 94 05/01/2021   BUN 4 (L) 05/01/2021   CREATININE 0.53 (L) 05/01/2021   BILITOT 0.3 05/01/2021   ALKPHOS 94 05/01/2021   AST 15 05/01/2021   ALT 15 05/01/2021   PROT 8.4 (H) 06/17/2021   ALBUMIN 4.2 05/01/2021   CALCIUM 9.4 05/01/2021   GFRAA 150 07/22/2020   QFTBGOLDPLUS NEGATIVE 06/17/2021    Speciality Comments: No specialty comments available.  Procedures:  No procedures performed Allergies: Patient has no known allergies.  Assessment / Plan:     Visit Diagnoses: No diagnosis found.  Orders: No orders of the defined types were placed in this encounter.  No orders of the defined types were placed in this encounter.   Face-to-face time spent with patient was *** minutes. Greater than 50% of time was spent in counseling and coordination of care.  Follow-Up Instructions: No follow-ups on file.   Ellen Henri,  CMA  Note - This record has been created using Animal nutritionist.  Chart creation errors have been sought, but may not always  have been located. Such creation errors do not reflect on  the standard of medical care.

## 2021-09-02 ENCOUNTER — Ambulatory Visit: Payer: Medicaid Other

## 2021-09-02 NOTE — Chronic Care Management (AMB) (Signed)
Chronic Care Management   CCM RN Visit Note  09/02/2021 Name: Natalie [name] MRN: [id number] DOB: [birth year]  Subjective: Natalie [name] is a 30 y.o. year old female who is a primary care patient of Westley Chandler, MD. The care management team was consulted for assistance with disease management and care coordination needs.    Engaged with patient by telephone for follow up visit in response to provider referral for case management and/or care coordination services.   Consent to Services:  The patient was given information about Chronic Care Management services, agreed to services, and gave verbal consent prior to initiation of services.  Please see initial visit note for detailed documentation.   Patient agreed to services and verbal consent obtained.    Assessment:  The patient continues to maintain positive progress with care plan goals.. See Care Plan below for interventions and patient self-care actives. Follow up Plan: Patient would like continued follow-up.  CCM The patient  will outreach the patient within the next 5 weeks.  Patient will call office if needed prior to next encounter Review of patient past medical history, allergies, medications, health status, including review of consultants reports, laboratory and other test data, was performed as part of comprehensive evaluation and provision of chronic care management services.   SDOH (Social Determinants of Health) assessments and interventions performed:    CCM Care Plan  No Known Allergies  Outpatient Encounter Medications as of 09/02/2021  Medication Sig   Acetaminophen (TYLENOL PO) Take by mouth as needed.   benztropine (COGENTIN) 1 MG tablet Take 1 tablet (1 mg total) by mouth at bedtime. (Patient taking differently: Take 1 mg by mouth in the morning and at bedtime.)   docusate sodium (COLACE) 100 MG capsule Take 1 capsule (100 mg total) by mouth 2 (two) times daily. For constipation   Etanercept (ENBREL MINI) 50  MG/ML SOCT Inject 50 mg into the skin once a week.   lithium carbonate (ESKALITH) 450 MG CR tablet Take 2 tablets (900 mg total) by mouth at bedtime.   medroxyPROGESTERone (DEPO-PROVERA) 150 MG/ML injection Inject 150 mg into the muscle every 3 (three) months.   mirtazapine (REMERON) 7.5 MG tablet Take 1 tablet (7.5 mg total) by mouth at bedtime as needed (insomnia).   Multiple Vitamins-Minerals (MULTIVITAMIN WOMEN PO) Take by mouth daily.   OLANZapine (ZYPREXA) 20 MG tablet Take 1 tablet (20 mg total) by mouth at bedtime.   predniSONE (DELTASONE) 5 MG tablet Take 4 tabs po x 4 days, 3  tabs po x 4 days, 2  tabs po x 4 days, 1  tab po x 4 days   tiZANidine (ZANAFLEX) 4 MG tablet Take 4 mg by mouth daily. (Patient not taking: No sig reported)   [DISCONTINUED] Oxcarbazepine (TRILEPTAL) 300 MG tablet Take 1 tablet (300 mg total) by mouth daily. For mood stabilization (Patient not taking: Reported on 06/21/2020)   [DISCONTINUED] Paliperidone Palmitate (INVEGA SUSTENNA IM) Inject 1 each into the muscle every 30 (thirty) days.   [DISCONTINUED] traZODone (DESYREL) 150 MG tablet Take 1 tablet (150 mg total) by mouth at bedtime. For sleep   No facility-administered encounter medications on file as of 09/02/2021.    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   Lithium use 04/02/2021   Menorrhagia 12/05/2020   Bipolar affective disorder, current episode manic with psychotic symptoms (HCC) 11/08/2020   Pain in both lower extremities 10/15/2020   Encounter for initial prescription of injectable contraceptive  06/21/2020   Acute stress reaction 06/21/2020   Morbid obesity (HCC) 10/23/2019   Bipolar 2 disorder (HCC) 08/31/2014   OCD (obsessive compulsive disorder)     Conditions to be addressed/monitored: Rheumatoid Arthritis  Care Plan : RN Care Manager  Updates made by [name], RN since 09/02/2021 12:00 AM     Problem: Rheumatoid Arthritis       Goal: The patient will maintain and manage her symptoms by following  the docotrss plan, taking her medications, and keeping her appointments   Start Date: 08/18/2021  Expected End Date: 10/29/2021  Priority: High  Note:   Current Barriers:  Chronic pain r/t Rheumatoid Arthritis as evidenced by patient stating her pain level is at  a 8/10.  Care Coordination needs related to Transportation   RNCM Clinical Goal(s):  Patient will verbalize understanding of plan for management of pain by describing how unrelieved pain will be managed , demonstrate the ability to pace her activities and keep all appointments  through collaboration with RN Care manager, provider, and care team.   Interventions: 1:1 collaboration with primary care provider regarding development and update of comprehensive plan of care as evidenced by provider attestation and co-signature Inter-disciplinary care team collaboration (see longitudinal plan of care) Evaluation of current treatment plan related to  self management and patient's adherence to plan as established by provider   Pain:  (Status: New goal.) Pain assessment performed Medications reviewed Reviewed provider established plan for pain management;- patient states that she was started on Enbrel Discussed importance of adherence to all scheduled medical appointments; Counseled on the importance of reporting any/all new or changed pain symptoms or management strategies to pain management provider; Assessed social determinant of health barriers; - she does not have transportation. A referral was placed to the care guides to help with transportation and she was notified that they will call her. Discussed with the patient about her fear of falling.  She walks with her rollator. Advised her that it is more stable and she needs to continue to use it.  To take her time as she walks be concise in her movements.  She verbalized understanding Discussed her appointments: the  patient stated she has on the 29th of September Rheumatology, 09/01/21 Dr. Cherly Hensen with pain management.  09/05/21 Dr Manson Passey and she is going to call and make her appointment [facility] who provides her psychiatry needs. She said they will not make an appointment ahead of time. RNCM will call the patient in 2 weeks.  I am unable to send her any information at his time due to they don't have the key to their mail box at the apartment.  When they have it she will let me know and I will be able to send her some information.  09/02/21:  Natalie [name] states that she is doing well; she has her transportation for her appointment. She says that she has seen her Psychiatrist at [facility], and he will fill and send in prescriptions for her Cogentin,  Zyprexa, and Lithium Carbonate. She will no longer have to see her therapist there because she has completed all of her tasks. Her next appointment with the Psychiatrist is within 12 weeks.  Natalie [name] feels the Enbrel shots are helping her. She thinks that she can move better with the injections. She uses her rollator for stability and feels more comfortable with her movements.     [name] RN, BSN, [facility] Care Management Coordinator [facility] Phone: [phone] I Fax:  [phone]           

## 2021-09-02 NOTE — Patient Instructions (Signed)
Visit Information  Ms. Limones  it was nice speaking with you. Please call me directly [phone] if you have questions about the goals we discussed.   Goals Addressed               This Visit's Progress     COMPLETED: I need transportation (pt-stated)        Timeframe:  Long-Range Goal Priority:  High Start Date:   08/18/21                          Expected End Date:  10/29/21                    Patient Goals/Self-Care Activities: Patient will self administer medications as prescribed Patient will attend all scheduled provider appointments Patient will call pharmacy for medication refills Patient will call provider office for new concerns or questions   Patient will expect call from Care[facility]ides        I will take care of my self and my conditions (pt-stated)        Timeframe:  Long-Range Goal Priority:  High Start Date:  08/18/21                           Expected End Date:   10/29/21                    Patient Goals/Self-Care Activities: Patient will self administer medications as prescribed as evidenced by self report/primary caregiver report  Patient will attend all scheduled provider appointments as evidenced by clinician review of documented attendance to scheduled appointments and patient/caregiver report Patient will call pharmacy for medication refills as evidenced by patient report and review of pharmacy fill history as appropriate Patient will call provider office for new concerns or questions as evidenced by review of documented incoming telephone call notes and patient report            The patient verbalizes understanding of the information and instructions discussed today.  Our next appointment is scheduled for  10/07/21.   Please feel free to call me or the office if you have any questions or concerns.  [name] RN, BSN, [facility] Care Management Coordinator [facility] Phone: [phone] I Fax: [phone]

## 2021-09-05 ENCOUNTER — Other Ambulatory Visit: Payer: Self-pay

## 2021-09-05 ENCOUNTER — Ambulatory Visit (INDEPENDENT_AMBULATORY_CARE_PROVIDER_SITE_OTHER): Payer: Medicaid Other | Admitting: Family Medicine

## 2021-09-05 DIAGNOSIS — R768 Other specified abnormal immunological findings in serum: Secondary | ICD-10-CM

## 2021-09-05 DIAGNOSIS — Z30013 Encounter for initial prescription of injectable contraceptive: Secondary | ICD-10-CM

## 2021-09-05 DIAGNOSIS — M069 Rheumatoid arthritis, unspecified: Secondary | ICD-10-CM | POA: Diagnosis not present

## 2021-09-05 DIAGNOSIS — F3181 Bipolar II disorder: Secondary | ICD-10-CM

## 2021-09-05 DIAGNOSIS — Z79899 Other long term (current) drug therapy: Secondary | ICD-10-CM

## 2021-09-05 DIAGNOSIS — M130 Polyarthritis, unspecified: Secondary | ICD-10-CM

## 2021-09-05 NOTE — Assessment & Plan Note (Signed)
Depo is scheduled upcoming.

## 2021-09-05 NOTE — Progress Notes (Signed)
[facility] Telemedicine Visit  Patient consented to have virtual visit and was identified by name and date of birth. Method of visit: Telephone  Encounter participants: Patient: Natalie [name] - located at home Provider: Westley Chandler - located at [facility] Others (if applicable): none   Chief Complaint: discuss meds   HPI:  Natalie [name] is a 30 year old with history of rheumatoid arthritis on Enbrel, obesity and bipolar disorder presenting via virtual visit to review her medications and discuss her positive hepatitis C antibody.  We discussed the results of her labs at length.  She is overall no concerns her medications were updated.  She does report she is due for her lithium labs and wonders if she can get them done in our office as she cannot get them done easily at [facility].  She reports overall she is doing great.  In terms of the patient's rheumatoid arthritis she is following with rheumatology and feels her symptoms are well controlled.  She has been compliant with her Enbrel.  The patient is currently on her typical medications including Zyprexa, mirtazapine, lithium and benztropine.  She denies side effects.  She is using Depo for contraception and knows when her next dose is due.  ROS: per HPI  Pertinent PMHx: Obesity, bipolar and rheumatoid arthritis  Exam:  Speaking in full sentences.  Assessment/Plan:  Lithium use Level ordered to be checked.  We will also order CBC and CMP.  Encounter for initial prescription of injectable contraceptive Depo is scheduled upcoming.   Obesity, discussed dietary changes and exercise.  A1c, lipids and TSH ordered.   Positive hepatitis C antibody with negative viral load, repeat viral load ordered.  Also encouraged her husband to be tested.  Time spent during visit with patient: 18 minutes

## 2021-09-05 NOTE — Assessment & Plan Note (Signed)
Level ordered to be checked.  We will also order CBC and CMP.

## 2021-09-15 ENCOUNTER — Ambulatory Visit: Payer: Medicaid Other | Admitting: Physician Assistant

## 2021-09-15 DIAGNOSIS — Z79899 Other long term (current) drug therapy: Secondary | ICD-10-CM

## 2021-09-15 DIAGNOSIS — F312 Bipolar disorder, current episode manic severe with psychotic features: Secondary | ICD-10-CM

## 2021-09-15 DIAGNOSIS — M24521 Contracture, right elbow: Secondary | ICD-10-CM

## 2021-09-15 DIAGNOSIS — M19071 Primary osteoarthritis, right ankle and foot: Secondary | ICD-10-CM

## 2021-09-15 DIAGNOSIS — G894 Chronic pain syndrome: Secondary | ICD-10-CM

## 2021-09-15 DIAGNOSIS — M0579 Rheumatoid arthritis with rheumatoid factor of multiple sites without organ or systems involvement: Secondary | ICD-10-CM

## 2021-09-15 DIAGNOSIS — M170 Bilateral primary osteoarthritis of knee: Secondary | ICD-10-CM

## 2021-09-17 ENCOUNTER — Other Ambulatory Visit: Payer: Medicaid Other

## 2021-09-17 ENCOUNTER — Ambulatory Visit (INDEPENDENT_AMBULATORY_CARE_PROVIDER_SITE_OTHER): Payer: Medicaid Other

## 2021-09-17 ENCOUNTER — Other Ambulatory Visit: Payer: Self-pay

## 2021-09-17 DIAGNOSIS — M069 Rheumatoid arthritis, unspecified: Secondary | ICD-10-CM

## 2021-09-17 DIAGNOSIS — R768 Other specified abnormal immunological findings in serum: Secondary | ICD-10-CM

## 2021-09-17 DIAGNOSIS — Z3042 Encounter for surveillance of injectable contraceptive: Secondary | ICD-10-CM | POA: Diagnosis not present

## 2021-09-17 DIAGNOSIS — Z23 Encounter for immunization: Secondary | ICD-10-CM

## 2021-09-17 DIAGNOSIS — F3181 Bipolar II disorder: Secondary | ICD-10-CM

## 2021-09-17 MED ORDER — MEDROXYPROGESTERONE ACETATE 150 MG/ML IM SUSP
150.0000 mg | Freq: Once | INTRAMUSCULAR | Status: AC
Start: 2021-09-17 — End: 2021-09-17
  Administered 2021-09-17: 150 mg via INTRAMUSCULAR

## 2021-09-17 NOTE — Progress Notes (Signed)
Patient here today for Depo Provera injection and is within her dates.    Last contraceptive appt was 09/05/2021  Depo given in RD today, per patient request.  Site unremarkable & patient tolerated injection.    Next injection due 01/04-01/18/2023.  Reminder card given.    Veronda Prude, RN

## 2021-09-19 LAB — COMPREHENSIVE METABOLIC PANEL
ALT: 8 IU/L (ref 0–32)
AST: 14 IU/L (ref 0–40)
Albumin/Globulin Ratio: 1.3 (ref 1.2–2.2)
Albumin: 4.5 g/dL (ref 3.9–5.0)
Alkaline Phosphatase: 107 IU/L (ref 44–121)
BUN/Creatinine Ratio: 14 (ref 9–23)
BUN: 8 mg/dL (ref 6–20)
Bilirubin Total: 0.5 mg/dL (ref 0.0–1.2)
CO2: 19 mmol/L — ABNORMAL LOW (ref 20–29)
Calcium: 9.9 mg/dL (ref 8.7–10.2)
Chloride: 107 mmol/L — ABNORMAL HIGH (ref 96–106)
Creatinine, Ser: 0.58 mg/dL (ref 0.57–1.00)
Globulin, Total: 3.6 g/dL (ref 1.5–4.5)
Glucose: 97 mg/dL (ref 70–99)
Potassium: 4.3 mmol/L (ref 3.5–5.2)
Sodium: 140 mmol/L (ref 134–144)
Total Protein: 8.1 g/dL (ref 6.0–8.5)
eGFR: 125 mL/min/{1.73_m2} (ref >59)

## 2021-09-19 LAB — CBC
Hematocrit: 40.2 % (ref 34.0–46.6)
Hemoglobin: 13.4 g/dL (ref 11.1–15.9)
MCH: 31.2 pg (ref 26.6–33.0)
MCHC: 33.3 g/dL (ref 31.5–35.7)
MCV: 94 fL (ref 79–97)
Platelets: 333 10*3/uL (ref 150–450)
RBC: 4.29 x10E6/uL (ref 3.77–5.28)
RDW: 12.7 % (ref 11.7–15.4)
WBC: 13.6 10*3/uL — ABNORMAL HIGH (ref 3.4–10.8)

## 2021-09-19 LAB — HEMOGLOBIN A1C
Est. average glucose Bld gHb Est-mCnc: 91 mg/dL
Hgb A1c MFr Bld: 4.8 % (ref 4.8–5.6)

## 2021-09-19 LAB — HCV RNA QUANT: Hepatitis C Quantitation: NOT DETECTED IU/mL

## 2021-09-19 LAB — LITHIUM LEVEL: Lithium Lvl: 0.6 mmol/L (ref 0.5–1.2)

## 2021-09-19 LAB — LIPID PANEL
Chol/HDL Ratio: 3.1 ratio (ref 0.0–4.4)
Cholesterol, Total: 183 mg/dL (ref 100–199)
HDL: 59 mg/dL (ref >39)
LDL Chol Calc (NIH): 111 mg/dL — ABNORMAL HIGH (ref 0–99)
Triglycerides: 67 mg/dL (ref 0–149)
VLDL Cholesterol Cal: 13 mg/dL (ref 5–40)

## 2021-09-19 LAB — TSH: TSH: 1.83 u[IU]/mL (ref 0.450–4.500)

## 2021-09-20 ENCOUNTER — Telehealth: Payer: Self-pay | Admitting: Family Medicine

## 2021-09-20 NOTE — Telephone Encounter (Signed)
Called with normal results. All labs appropriate. WBC slightly elevated---monitor. Suspect due to prednisone.   [name], MD  Family Medicine Teaching Service

## 2021-09-22 ENCOUNTER — Telehealth: Payer: Self-pay | Admitting: Family Medicine

## 2021-09-22 ENCOUNTER — Encounter: Payer: Self-pay | Admitting: Family Medicine

## 2021-09-22 NOTE — Telephone Encounter (Signed)
Sent letter to patient with labs to take to [facility] for next visit. Nursing--please call patient and let her know.   Thank you! [name], MD  Family Medicine Teaching Service

## 2021-09-22 NOTE — Telephone Encounter (Signed)
-----   Message from Mallie Mussel sent at 09/22/2021  2:00 PM EDT ----- Natalie [name] is calling and said that she spoke with you last week and said that you would be sending her lithem results to [facility]. I explained we could do that if they sent [place] a release or if she signed one. She said when she spoke with you, you said you would have them sent. Have you done this? I do not see anything documented.   Thanks [employer]

## 2021-09-23 NOTE — Telephone Encounter (Signed)
Pt informed. Dreana Britz, CMA  

## 2021-10-03 ENCOUNTER — Ambulatory Visit (INDEPENDENT_AMBULATORY_CARE_PROVIDER_SITE_OTHER): Payer: Medicaid Other

## 2021-10-03 ENCOUNTER — Ambulatory Visit (INDEPENDENT_AMBULATORY_CARE_PROVIDER_SITE_OTHER): Payer: Medicaid Other | Admitting: Family Medicine

## 2021-10-03 ENCOUNTER — Other Ambulatory Visit: Payer: Self-pay

## 2021-10-03 ENCOUNTER — Other Ambulatory Visit: Payer: Self-pay | Admitting: Physician Assistant

## 2021-10-03 ENCOUNTER — Encounter: Payer: Self-pay | Admitting: Family Medicine

## 2021-10-03 VITALS — BP 129/86 | HR 94 | Wt 209.0 lb

## 2021-10-03 DIAGNOSIS — M0579 Rheumatoid arthritis with rheumatoid factor of multiple sites without organ or systems involvement: Secondary | ICD-10-CM

## 2021-10-03 DIAGNOSIS — B353 Tinea pedis: Secondary | ICD-10-CM

## 2021-10-03 DIAGNOSIS — M130 Polyarthritis, unspecified: Secondary | ICD-10-CM | POA: Diagnosis not present

## 2021-10-03 DIAGNOSIS — Z79899 Other long term (current) drug therapy: Secondary | ICD-10-CM

## 2021-10-03 DIAGNOSIS — Z23 Encounter for immunization: Secondary | ICD-10-CM

## 2021-10-03 DIAGNOSIS — F3181 Bipolar II disorder: Secondary | ICD-10-CM

## 2021-10-03 DIAGNOSIS — Z0000 Encounter for general adult medical examination without abnormal findings: Secondary | ICD-10-CM | POA: Diagnosis not present

## 2021-10-03 DIAGNOSIS — M059 Rheumatoid arthritis with rheumatoid factor, unspecified: Secondary | ICD-10-CM | POA: Diagnosis not present

## 2021-10-03 DIAGNOSIS — M069 Rheumatoid arthritis, unspecified: Secondary | ICD-10-CM | POA: Insufficient documentation

## 2021-10-03 MED ORDER — KETOCONAZOLE 2 % EX CREA: 1.0000 "application " | TOPICAL_CREAM | Freq: Every day | CUTANEOUS | 1 refills | Status: DC

## 2021-10-03 MED ORDER — TERBINAFINE HCL 250 MG PO TABS: 250.0000 mg | ORAL_TABLET | Freq: Every day | ORAL | 0 refills | Status: DC

## 2021-10-03 NOTE — Progress Notes (Signed)
    SUBJECTIVE:   Chief compliant/HPI: annual examination  Natalie [name] is a 30 y.o. who presents today for an annual exam.   The patient reports about a month-long history of a slightly pruritic rash on her feet. Has tried nothing for this.   Review of systems form notable for negative for fevers, nausea, vomiting, or irregular menese. Marland Kitchen   Updated history tabs and problem list updated .   OBJECTIVE:   BP 129/86   Pulse 94   Wt 209 lb (94.8 kg)   SpO2 96%   BMI 35.87 kg/m   HR noted and improved on exam Cardiac: Regular rate and rhythm. Normal S1/S2. No murmurs, rubs, or gallops appreciated. Lungs: Clear bilaterally to ascultation.  Abdomen: Normoactive bowel sounds. No tenderness to deep or light palpation. No rebound or guarding.  Psych: Pleasant and appropriate  Bilateral LE examined + thickened hyperpigmented skin with significant flaking   ASSESSMENT/PLAN:  Tinea Pedis  Suspect this is most likely cause.  Low suspicion for scabies or other condition at this point.  Given severity checked all medication reactions including with Enbrel and prescribed terbinafine for 2 weeks.  We will repeat hepatic panel at follow-up.  Also prescribed topical therapy    Annual Examination  See AVS for age appropriate recommendations.   PHQ score Depression screen [facility] 2/9 10/03/2021 05/01/2021 04/02/2021 12/05/2020 06/21/2020  Decreased Interest 0 0 0 0 0  Down, Depressed, Hopeless 0 1 0 0 0  PHQ - 2 Score 0 1 0 0 0  Altered sleeping 0 2 0 0 0  Tired, decreased energy 0 $Remove'2 1 2 'ETsOlUx$ 0  Change in appetite 0 2 1 0 1  Feeling bad or failure about yourself  0 0 0 1 0  Trouble concentrating 0 0 0 3 0  Moving slowly or fidgety/restless 0 0 0 0 1  Suicidal thoughts 0 0 0 0 0  PHQ-9 Score 0 $Remov'7 2 6 2  'ObWWFd$ Difficult doing work/chores - Somewhat difficult Somewhat difficult Not difficult at all Not difficult at all   Uses Depo    Considered the following items based upon USPSTF recommendations: Obtain  RPR at follow up  Lipid panel (nonfasting or fasting) discussed based upon AHA recommendations and not ordered.  Consider repeat every 4-6 years.  Reviewed risk factors for latent tuberculosis and not indicated  Discussed family history, BRCA testing not indicated.  Cervical cancer screening: prior Pap reviewed, repeat due in 2024 Immunizations COVID given  Follow up in 2-4 weeks to check feet.    Martyn Malay, MD [place]

## 2021-10-03 NOTE — Patient Instructions (Addendum)
It was wonderful to see you today.  Please bring ALL of your medications with you to every visit.   Today we talked about:  ---Congratulations on staying healthy  - I sent a pill to take ONCE  a day to your pharmacy for two weeks   - I sent in a topical cream for your feet   Follow up in 2 weeks to check in on your feet     Thank you for choosing [facility].   Please call [phone] with any questions about today's appointment.  Please be sure to schedule follow up at the front  desk before you leave today.   [name], MD  Family Medicine

## 2021-10-03 NOTE — Telephone Encounter (Signed)
Next Visit: 10/08/2021  Last Visit: 07/10/2021  Last Fill: 07/10/2021  YB:OFBPZWCHEN arthritis with rheumatoid factor of multiple sites without organ or systems involvement   Current Dose per office note 07/10/2021: Enbrel dose of 50 mg once weekly  Labs: 09/17/2021 Chloride 107, CO2 19, WBC 13.6  TB Gold: 06/17/2021 Neg    Okay to refill Enbrel?

## 2021-10-06 ENCOUNTER — Telehealth: Payer: Self-pay | Admitting: Rheumatology

## 2021-10-06 ENCOUNTER — Other Ambulatory Visit: Payer: Self-pay | Admitting: Pharmacist

## 2021-10-06 ENCOUNTER — Other Ambulatory Visit (HOSPITAL_COMMUNITY): Payer: Self-pay

## 2021-10-06 DIAGNOSIS — M0579 Rheumatoid arthritis with rheumatoid factor of multiple sites without organ or systems involvement: Secondary | ICD-10-CM

## 2021-10-06 DIAGNOSIS — Z79899 Other long term (current) drug therapy: Secondary | ICD-10-CM

## 2021-10-06 MED ORDER — ENBREL MINI 50 MG/ML ~~LOC~~ SOCT
SUBCUTANEOUS | 1 refills | Status: DC
  Filled 2021-10-06: qty 4, 28d supply, fill #0
  Filled 2021-10-30: qty 4, 28d supply, fill #1

## 2021-10-06 NOTE — Telephone Encounter (Signed)
Refill for Enbrel sent to [facility]. Patient would like medication delivered to her home moving forward. Rx sent for 2 months only as patient is due for labs in 2 months  [name], PharmD, MPH, BCPS Clinical Pharmacist (Rheumatology and Pulmonology)

## 2021-10-06 NOTE — Telephone Encounter (Signed)
Patient has a question about her Enbrel. Do you want patient to keep taking Enbrel? Per patient, sometimes she can get it, and sometimes she cannot. She has it, and has been able to get it, but she does not want to get into a situation where she cannot get it in the future ?Marland Kitchen Please call to discuss.

## 2021-10-06 NOTE — Telephone Encounter (Signed)
Patient states she has an appointment on 11/18/2021. Patient states she has been on Enbrel since August 2022. Patient states she has been taking the medication as prescribed. Patient states it is bothering her to have to give herself an injection every week. Patient states she feels like the medication isn't lasting as long. She gets pain in between injections. Patient advised to continue her Enbrel and to discuss treatment options at her follow up appointment. Patient expressed understanding.

## 2021-10-07 ENCOUNTER — Other Ambulatory Visit (HOSPITAL_COMMUNITY): Payer: Self-pay

## 2021-10-07 NOTE — Telephone Encounter (Signed)
Delivery instructions have been updated in [place], medication will be shipped to patient's home address by 10/09/21.  Rx has been processed in [facility] and there is a copay of $4.00. Payment information has been collected and forwarded to the pharmacy.

## 2021-10-08 ENCOUNTER — Ambulatory Visit: Payer: Medicaid Other | Admitting: Physician Assistant

## 2021-10-08 ENCOUNTER — Other Ambulatory Visit (HOSPITAL_COMMUNITY): Payer: Self-pay

## 2021-10-08 ENCOUNTER — Ambulatory Visit: Payer: Medicaid Other

## 2021-10-08 NOTE — Patient Instructions (Signed)
Visit Information  Ms. Kue  it was nice speaking with you. Please call me directly [phone] if you have questions about the goals we discussed.  Patient Goals/Self-Care Activities: Patient will self administer medications as prescribed as evidenced by self report/primary caregiver report  Patient will attend all scheduled provider appointments as evidenced by clinician review of documented attendance to scheduled appointments and patient/caregiver report Patient will call pharmacy for medication refills as evidenced by patient report and review of pharmacy fill history as appropriate Patient will call provider office for new concerns or questions as evidenced by review of documented incoming telephone call notes and patient report    The patient verbalized understanding of instructions, educational materials, and care plan provided today and declined offer to receive copy of patient instructions, educational materials, and care plan.   Follow up Plan: Patient would like continued follow-up.  CCM RNCM will outreach the patient within the next 5 weeks.  Patient will call office if needed prior to next encounter  [name], RN  [phone]

## 2021-10-08 NOTE — Chronic Care Management (AMB) (Signed)
Chronic Care Management   CCM RN Visit Note  10/08/2021 Name: Natalie [name] MRN: [id number] DOB: 01/26/1991  Subjective: Natalie [name] is a 30 y.o. year old female who is a primary care patient of Westley Chandler, MD. The care management team was consulted for assistance with disease management and care coordination needs.    Engaged with patient by telephone for follow up visit in response to provider referral for case management and/or care coordination services.   Consent to Services:  The patient was given information about Chronic Care Management services, agreed to services, and gave verbal consent prior to initiation of services.  Please see initial visit note for detailed documentation.   Patient agreed to services and verbal consent obtained.    Assessment:  The patient continues to maintain positive progress with care plan goals.. See Care Plan below for interventions and patient self-care actives. Follow up Plan: Patient would like continued follow-up.  CCM RNCM will outreach the patient within the next 5 weeks.  Patient will call office if needed prior to next encounter Review of patient past medical history, allergies, medications, health status, including review of consultants reports, laboratory and other test data, was performed as part of comprehensive evaluation and provision of chronic care management services.   SDOH (Social Determinants of Health) assessments and interventions performed:    CCM Care Plan  No Known Allergies  Outpatient Encounter Medications as of 10/08/2021  Medication Sig   Acetaminophen (TYLENOL PO) Take by mouth as needed.   benztropine (COGENTIN) 1 MG tablet Take 1 tablet (1 mg total) by mouth at bedtime. (Patient taking differently: Take 1 mg by mouth in the morning and at bedtime.)   docusate sodium (COLACE) 100 MG capsule Take 1 capsule (100 mg total) by mouth 2 (two) times daily. For constipation   Etanercept (ENBREL MINI) 50 MG/ML  SOCT INJECT 50MG  INTO THE SKIN ONCE WEEKLY   ketoconazole (NIZORAL) 2 % cream Apply 1 application topically daily.   lithium carbonate (ESKALITH) 450 MG CR tablet Take 2 tablets (900 mg total) by mouth at bedtime.   medroxyPROGESTERone (DEPO-PROVERA) 150 MG/ML injection Inject 150 mg into the muscle every 3 (three) months.   mirtazapine (REMERON) 7.5 MG tablet Take 1 tablet (7.5 mg total) by mouth at bedtime as needed (insomnia).   Multiple Vitamins-Minerals (MULTIVITAMIN WOMEN PO) Take by mouth daily.   OLANZapine (ZYPREXA) 20 MG tablet Take 1 tablet (20 mg total) by mouth at bedtime.   terbinafine (LAMISIL) 250 MG tablet Take 1 tablet (250 mg total) by mouth daily.   [DISCONTINUED] Oxcarbazepine (TRILEPTAL) 300 MG tablet Take 1 tablet (300 mg total) by mouth daily. For mood stabilization (Patient not taking: Reported on 06/21/2020)   [DISCONTINUED] Paliperidone Palmitate (INVEGA SUSTENNA IM) Inject 1 each into the muscle every 30 (thirty) days.   [DISCONTINUED] traZODone (DESYREL) 150 MG tablet Take 1 tablet (150 mg total) by mouth at bedtime. For sleep   No facility-administered encounter medications on file as of 10/08/2021.    Patient Active Problem List   Diagnosis Date Noted   Rheumatoid arthritis (HCC) 10/03/2021   Tinea pedis of both feet 10/03/2021   Lithium use 04/02/2021   Morbid obesity (HCC) 10/23/2019   Bipolar 2 disorder (HCC) 08/31/2014   OCD (obsessive compulsive disorder)     Conditions to be addressed/monitored: Rheumatoid Arthritis  Care Plan : RN Care Manager  Updates made by 10/31/2014, RN since 10/08/2021 12:00 AM     Problem: Rheumatoid Arthritis  Goal: The patient will maintain and manage her symptoms by following  the docotrss plan, taking her medications, and keeping her appointments   Start Date: 08/18/2021  Expected End Date: 10/29/2021  Priority: High  Note:   Current Barriers:  Chronic pain r/t Rheumatoid Arthritis as evidenced by patient  stating her pain level is at  a 8/10.  Care Coordination needs related to Transportation   RNCM Clinical Goal(s):  Patient will verbalize understanding of plan for management of pain by describing how unrelieved pain will be managed , demonstrate the ability to pace her activities and keep all appointments  through collaboration with RN Care manager, provider, and care team.   Interventions: 1:1 collaboration with primary care provider regarding development and update of comprehensive plan of care as evidenced by provider attestation and co-signature Inter-disciplinary care team collaboration (see longitudinal plan of care) Evaluation of current treatment plan related to  self management and patient's adherence to plan as established by provider   Pain:  (Status: New goal.) Pain assessment performed Medications reviewed Reviewed provider established plan for pain management;-  Discussed importance of adherence to all scheduled medical appointments; Counseled on the importance of reporting any/all new or changed pain symptoms or management strategies to pain management provider; Assessed social determinant of health barriers; - Transportation has been provided for her. 10/08/21:  I spoke with [place] today, and she said she is doing well. She is going to her appointments and taking her medications as prescribed. Her arthritis bothers her, and she has pain, which is all over her body and fluctuates. She rates that pain at a 4/10 today. She takes her Enbrel shots and has a topical rub if needed. She tries to stay active and keep her body moving as much as tolerated. She is using her rollator to prevent falls. We discussed her diet and exercise, and she continued her regime for the best outcome. She said that she has a lot of support from her husband, which significantly helps her.   Patient Goals/Self-Care Activities: Patient will self administer medications as prescribed as evidenced by self  report/primary caregiver report  Patient will attend all scheduled provider appointments as evidenced by clinician review of documented attendance to scheduled appointments and patient/caregiver report Patient will call pharmacy for medication refills as evidenced by patient report and review of pharmacy fill history as appropriate Patient will call provider office for new concerns or questions as evidenced by review of documented incoming telephone call notes and patient report      [name] RN, BSN, [facility] Care Management Coordinator [facility] Phone: [phone] I Fax: [phone]

## 2021-10-09 ENCOUNTER — Other Ambulatory Visit (HOSPITAL_COMMUNITY): Payer: Self-pay

## 2021-10-10 ENCOUNTER — Other Ambulatory Visit (HOSPITAL_COMMUNITY): Payer: Self-pay

## 2021-10-27 ENCOUNTER — Telehealth: Payer: Self-pay | Admitting: *Deleted

## 2021-10-27 MED ORDER — PREDNISONE 5 MG PO TABS: ORAL_TABLET | ORAL | 0 refills | Status: DC

## 2021-10-27 NOTE — Telephone Encounter (Signed)
Patient advised we will be sending in a prednisone taper to the pharmacy. Patient states she does not want a prednisone taper sent to the pharmacy. Patient states she will wait until her appointment and discuss other options at that time.

## 2021-10-27 NOTE — Addendum Note (Signed)
Addended by: Gearldine Bienenstock on: 10/27/2021 06:41 PM   Modules accepted: Orders

## 2021-10-27 NOTE — Telephone Encounter (Signed)
Ok to send in prednisone taper starting at 20 mg tapering by 5 mg every 4 days.  We will need to discuss other treatment options at her upcoming appt since she continues to have recurrent flares on current therapy.

## 2021-10-27 NOTE — Telephone Encounter (Signed)
Patient states she has been on Enbrel for about 3 months. Patient states she is having soreness in her arms and legs. Patient states it is all day. Patient has an appointment on November 18, 2021. Patient states she is unable to come in for a sooner appointment. Patient would like to know what she can do about the pain and soreness until her appointment.

## 2021-10-30 ENCOUNTER — Other Ambulatory Visit (HOSPITAL_COMMUNITY): Payer: Self-pay

## 2021-10-31 ENCOUNTER — Other Ambulatory Visit (HOSPITAL_COMMUNITY): Payer: Self-pay

## 2021-11-03 ENCOUNTER — Other Ambulatory Visit (HOSPITAL_COMMUNITY): Payer: Self-pay

## 2021-11-04 ENCOUNTER — Other Ambulatory Visit (HOSPITAL_COMMUNITY): Payer: Self-pay

## 2021-11-10 ENCOUNTER — Other Ambulatory Visit (HOSPITAL_COMMUNITY): Payer: Self-pay

## 2021-11-12 ENCOUNTER — Ambulatory Visit: Payer: Medicaid Other

## 2021-11-12 NOTE — Chronic Care Management (AMB) (Signed)
Chronic Care Management   CCM RN Visit Note  11/12/2021 Name: Natalie [name] MRN: [id number] DOB: 29-Dec-1990  Subjective: Natalie [name] is a 30 y.o. year old female who is a primary care patient of Natalie Chandler, MD. The care management team was consulted for assistance with disease management and care coordination needs.    Engaged with patient by telephone for follow up visit in response to provider referral for case management and/or care coordination services.   Consent to Services:  The patient was given information about Chronic Care Management services, agreed to services, and gave verbal consent prior to initiation of services.  Please see initial visit note for detailed documentation.   Patient agreed to services and verbal consent obtained.    Assessment:  The patient continues to maintain positive progress with care plan goals.. See Care Plan below for interventions and patient self-care actives. Follow up Plan: Patient would like continued follow-up.  CCM RNCM will outreach the patient within the next 4 weeks.  Patient will call office if needed prior to next encounter  Review of patient past medical history, allergies, medications, health status, including review of consultants reports, laboratory and other test data, was performed as part of comprehensive evaluation and provision of chronic care management services.   SDOH (Social Determinants of Health) assessments and interventions performed:    CCM Care Plan  No Known Allergies  Outpatient Encounter Medications as of 11/12/2021  Medication Sig   Acetaminophen (TYLENOL PO) Take by mouth as needed.   benztropine (COGENTIN) 1 MG tablet Take 1 tablet (1 mg total) by mouth at bedtime. (Patient taking differently: Take 1 mg by mouth in the morning and at bedtime.)   docusate sodium (COLACE) 100 MG capsule Take 1 capsule (100 mg total) by mouth 2 (two) times daily. For constipation   Etanercept (ENBREL MINI) 50 MG/ML  SOCT INJECT 50MG  INTO THE SKIN ONCE WEEKLY   ketoconazole (NIZORAL) 2 % cream Apply 1 application topically daily.   lithium carbonate (ESKALITH) 450 MG CR tablet Take 2 tablets (900 mg total) by mouth at bedtime.   medroxyPROGESTERone (DEPO-PROVERA) 150 MG/ML injection Inject 150 mg into the muscle every 3 (three) months.   mirtazapine (REMERON) 7.5 MG tablet Take 1 tablet (7.5 mg total) by mouth at bedtime as needed (insomnia).   Multiple Vitamins-Minerals (MULTIVITAMIN WOMEN PO) Take by mouth daily.   OLANZapine (ZYPREXA) 20 MG tablet Take 1 tablet (20 mg total) by mouth at bedtime.   predniSONE (DELTASONE) 5 MG tablet Take 4 tablets by mouth daily x4 days, 3 tablets daily x4 days, 2 tablets daily x4 days, 1 tablet daily x4 days.   terbinafine (LAMISIL) 250 MG tablet Take 1 tablet (250 mg total) by mouth daily.   [DISCONTINUED] Oxcarbazepine (TRILEPTAL) 300 MG tablet Take 1 tablet (300 mg total) by mouth daily. For mood stabilization (Patient not taking: Reported on 06/21/2020)   [DISCONTINUED] Paliperidone Palmitate (INVEGA SUSTENNA IM) Inject 1 each into the muscle every 30 (thirty) days.   [DISCONTINUED] traZODone (DESYREL) 150 MG tablet Take 1 tablet (150 mg total) by mouth at bedtime. For sleep   No facility-administered encounter medications on file as of 11/12/2021.    Patient Active Problem List   Diagnosis Date Noted   Rheumatoid arthritis (HCC) 10/03/2021   Tinea pedis of both feet 10/03/2021   Lithium use 04/02/2021   Morbid obesity (HCC) 10/23/2019   Bipolar 2 disorder (HCC) 08/31/2014   OCD (obsessive compulsive disorder)     Conditions  to be addressed/monitored: Rheumatoid Arthritis  Care Plan : RN Care Manager  Updates made by [name], RN since 11/12/2021 12:00 AM     Problem: Rheumatoid Arthritis      Goal: The patient will maintain and manage her symptoms by following  the docotrss plan, taking her medications, and keeping her appointments   Start Date:  08/18/2021  Expected End Date: 12/30/2021  Priority: High  Note:   Current Barriers:  Chronic pain r/t Rheumatoid Arthritis as evidenced by patient stating her pain level is at  a 8/10.    RNCM Clinical Goal(s):  Patient will verbalize understanding of plan for management of pain by describing how unrelieved pain will be managed , demonstrate the ability to pace her activities and keep all appointments  through collaboration with RN Care manager, provider, and care team.   Interventions: 1:1 collaboration with primary care provider regarding development and update of comprehensive plan of care as evidenced by provider attestation and co-signature Inter-disciplinary care team collaboration (see longitudinal plan of care) Evaluation of current treatment plan related to  self management and patient's adherence to plan as established by provider   Pain:  (Status: Goal on Track (progressing): YES.) Long Term                                         Pain assessment performed Medications reviewed Reviewed provider established plan for pain management;-  Discussed importance of adherence to all scheduled medical appointments; Counseled on the importance of reporting any/all new or changed pain symptoms or management strategies to pain management provider; Assessed social determinant of health barriers; - Transportation has been provided for her. 11/12/21: [name] is dxoing well.  She states that she had a bout with body aches and pain, so her rheumatologist's office called her in a prednisone pack, and she is feeling better.   She is taking all of her medications and monitoring her diet.  She feels like the depo shot helps to increase her appetite, but she is learning to control the cravings. Since she is feeling better, she wants to stretch and walk more with her walker.   Patient Goals/Self Care Activities: -Patient/Caregiver will self-administer medications as prescribed as evidenced by  self-report/primary caregiver report  -Patient/Caregiver will attend all scheduled provider appointments as evidenced by clinician review of documented attendance to scheduled appointments and patient/caregiver report -Patient/Caregiver will call pharmacy for medication refills as evidenced by patient report and review of pharmacy fill history as appropriate -Patient/Caregiver will call provider office for new concerns or questions as evidenced by review of documented incoming telephone call notes and patient report -Patient/Caregiver verbalizes understanding of plan -Patient/Caregiver will focus on medication adherence by taking medications as prescribed -[name] (assistive device) appropriately with all ambulation       [name] RN, BSN, [facility] Care Management Coordinator [facility] Phone: [phone] I Fax: [phone]

## 2021-11-12 NOTE — Patient Instructions (Addendum)
Visit Information  Natalie [name]  it was nice speaking with you. Please call me directly [phone] if you have questions about the goals we discussed.  Patient Goals/Self Care Activities: -Patient/Caregiver will self-administer medications as prescribed as evidenced by self-report/primary caregiver report  -Patient/Caregiver will attend all scheduled provider appointments as evidenced by clinician review of documented attendance to scheduled appointments and patient/caregiver report -Patient/Caregiver will call pharmacy for medication refills as evidenced by patient report and review of pharmacy fill history as appropriate -Patient/Caregiver will call provider office for new concerns or questions as evidenced by review of documented incoming telephone call notes and patient report -Patient/Caregiver verbalizes understanding of plan -Patient/Caregiver will focus on medication adherence by taking medications as prescribed -Utilize Walker (assistive device) appropriately with all ambulation        The patient verbalized understanding of instructions, educational materials, and care plan provided today and declined offer to receive copy of patient instructions, educational materials, and care plan.   Follow up Plan: Patient would like continued follow-up.  CCM RNCM will outreach the patient within the next 4 weeks.  Patient will call office if needed prior to next encounter  [name], RN  [phone]

## 2021-11-18 ENCOUNTER — Ambulatory Visit: Payer: Medicaid Other | Admitting: Rheumatology

## 2021-11-26 ENCOUNTER — Other Ambulatory Visit (HOSPITAL_COMMUNITY): Payer: Self-pay

## 2021-11-26 ENCOUNTER — Other Ambulatory Visit: Payer: Self-pay | Admitting: Physician Assistant

## 2021-11-26 DIAGNOSIS — M0579 Rheumatoid arthritis with rheumatoid factor of multiple sites without organ or systems involvement: Secondary | ICD-10-CM

## 2021-11-26 DIAGNOSIS — Z79899 Other long term (current) drug therapy: Secondary | ICD-10-CM

## 2021-11-26 MED ORDER — ENBREL MINI 50 MG/ML ~~LOC~~ SOCT
SUBCUTANEOUS | 1 refills | Status: DC
  Filled 2021-11-26: qty 4, 28d supply, fill #0

## 2021-11-26 NOTE — Telephone Encounter (Signed)
Next Visit: 12/16/2021  Last Visit: 07/10/2021  Last Fill: 10/06/2021  NO:[id number] arthritis with rheumatoid factor of multiple sites without organ or systems involvement  Current Dose per office note 07/10/2021: Enbrel 50 mg subcutaneous injections once weekly  Labs: 09/17/2021 Chloride 107, CO2 19, WBC 13.6  TB Gold: Negative on 06/17/21  Okay to refill Enbrel?

## 2021-11-27 ENCOUNTER — Other Ambulatory Visit (HOSPITAL_COMMUNITY): Payer: Self-pay

## 2021-11-28 ENCOUNTER — Telehealth: Payer: Self-pay | Admitting: *Deleted

## 2021-11-28 NOTE — Telephone Encounter (Signed)
Patient contacted the office stating she is experiencing soreness even while on Enbrel. Patient advised the plan is to discuss treatment options at her upcoming appointment. Patient advised we have planned to discuss this with her at the last two appointment she cancelled on 10/08/2021 and 11/18/2021. Patient advised we will need to discuss this with her in office. Patient offered a sooner appointment but has declined. Patient advised if she cancels the appointment on 12/16/2021 or no shows we may not be able to reschedule her as she has cancelled 5 appointments since August 2022. Last appointment in office was 07/10/2021.

## 2021-12-01 ENCOUNTER — Other Ambulatory Visit (HOSPITAL_COMMUNITY): Payer: Self-pay

## 2021-12-05 ENCOUNTER — Telehealth: Payer: Self-pay

## 2021-12-05 ENCOUNTER — Encounter: Payer: Self-pay | Admitting: *Deleted

## 2021-12-05 ENCOUNTER — Other Ambulatory Visit (HOSPITAL_COMMUNITY): Payer: Self-pay

## 2021-12-05 NOTE — Telephone Encounter (Signed)
Patient called stating "it was extremely important that she talk with the nurse asap."  Patient requested a return call.

## 2021-12-05 NOTE — Telephone Encounter (Signed)
Discharge letter mailed  

## 2021-12-05 NOTE — Telephone Encounter (Signed)
Patient states she "has to cancel her appointment for 12/16/2021. Just can't make it." Patient advised she has already cancelled and rescheduled 5 appointments. Patient advised that she may be discharged from the practice since we usually only allow 3 cancellations/no shows before discharging. Patient states "well I am going to stop my Enbrel anyway". Patient advised I would advise Dr. Corliss Skains.

## 2021-12-05 NOTE — Telephone Encounter (Signed)
Please send a discharge letter.  Thank you.

## 2021-12-08 ENCOUNTER — Telehealth: Payer: Self-pay | Admitting: *Deleted

## 2021-12-08 DIAGNOSIS — M059 Rheumatoid arthritis with rheumatoid factor, unspecified: Secondary | ICD-10-CM

## 2021-12-08 DIAGNOSIS — F3181 Bipolar II disorder: Secondary | ICD-10-CM

## 2021-12-08 NOTE — Telephone Encounter (Signed)
Patient's spouse called asking for a new referral for rheumatology due to the previous provider "being rude".  I advised the spouse that [facility] was being [facility] due to too  many missed appointments.  He voiced understanding and would like to be referred to another office.  I explained to him that due to insurance, I don't know if [place] but that I would be in touch.  Will forward to MD to place a new referral.  Emari Demmer,CMA

## 2021-12-08 NOTE — Telephone Encounter (Signed)
New referral placed. Will also route to CCM team (previously referred) as patient and her husband face transportation issues and this is often the reason for no shows.  New referral placed for CCM for RN case management given her complexity and ongoing medical conditions.   Natalie Starr, MD  Family Medicine Teaching Service

## 2021-12-10 ENCOUNTER — Ambulatory Visit: Payer: Medicaid Other

## 2021-12-10 NOTE — Chronic Care Management (AMB) (Signed)
Chronic Care Management   CCM RN Visit Note  12/10/2021 Name: Natalie [name] MRN: [id number] DOB: 04-Mar-1991  Subjective: Natalie [name] is a 31 y.o. year old female who is a primary care patient of Westley Chandler, MD. The care management team was consulted for assistance with disease management and care coordination needs.    Engaged with patient by telephone for follow up visit in response to provider referral for case management and/or care coordination services.   Consent to Services:  The patient was given information about Chronic Care Management services, agreed to services, and gave verbal consent prior to initiation of services.  Please see initial visit note for detailed documentation.   Patient agreed to services and verbal consent obtained.    Assessment: Patient is making progress with rheumatoid arthritis, but will need to find a new rheumatologis. See Care Plan below for interventions and patient self-care actives. Follow up Plan: Patient would like continued follow-up.  CCM RNCM will outreach the patient within the next 5 weeks.  Patient will call office if needed prior to next encounter : Review of patient past medical history, allergies, medications, health status, including review of consultants reports, laboratory and other test data, was performed as part of comprehensive evaluation and provision of chronic care management services.   SDOH (Social Determinants of Health) assessments and interventions performed:    CCM Care Plan  No Known Allergies  Outpatient Encounter Medications as of 12/10/2021  Medication Sig Note   Acetaminophen (TYLENOL PO) Take by mouth as needed.    benztropine (COGENTIN) 1 MG tablet Take 1 tablet (1 mg total) by mouth at bedtime. (Patient taking differently: Take 1 mg by mouth in the morning and at bedtime.)    lithium carbonate (ESKALITH) 450 MG CR tablet Take 2 tablets (900 mg total) by mouth at bedtime.    medroxyPROGESTERone  (DEPO-PROVERA) 150 MG/ML injection Inject 150 mg into the muscle every 3 (three) months.    mirtazapine (REMERON) 7.5 MG tablet Take 1 tablet (7.5 mg total) by mouth at bedtime as needed (insomnia).    Multiple Vitamins-Minerals (MULTIVITAMIN WOMEN PO) Take by mouth daily.    OLANZapine (ZYPREXA) 20 MG tablet Take 1 tablet (20 mg total) by mouth at bedtime.    docusate sodium (COLACE) 100 MG capsule Take 1 capsule (100 mg total) by mouth 2 (two) times daily. For constipation (Patient not taking: Reported on 12/10/2021) 12/10/2021: Patient takes it as needed   Etanercept (ENBREL MINI) 50 MG/ML SOCT INJECT  INTO THE SKIN ONCE WEEKLY (Patient not taking: Reported on 12/10/2021)    ketoconazole (NIZORAL) 2 % cream Apply 1 application topically daily. (Patient not taking: Reported on 12/10/2021) 12/10/2021: Patient states not helping   predniSONE (DELTASONE) 5 MG tablet Take 4 tablets by mouth daily x4 days, 3 tablets daily x4 days, 2 tablets daily x4 days, 1 tablet daily x4 days. (Patient not taking: Reported on 12/10/2021)    terbinafine (LAMISIL) 250 MG tablet Take 1 tablet (250 mg total) by mouth daily. (Patient not taking: Reported on 12/10/2021)    [DISCONTINUED] Oxcarbazepine (TRILEPTAL) 300 MG tablet Take 1 tablet (300 mg total) by mouth daily. For mood stabilization (Patient not taking: Reported on 06/21/2020)    [DISCONTINUED] Paliperidone Palmitate (INVEGA SUSTENNA IM) Inject 1 each into the muscle every 30 (thirty) days.    [DISCONTINUED] traZODone (DESYREL) 150 MG tablet Take 1 tablet (150 mg total) by mouth at bedtime. For sleep    No facility-administered encounter medications on file  as of 12/10/2021.    Patient Active Problem List   Diagnosis Date Noted   Rheumatoid arthritis (HCC) 10/03/2021   Tinea pedis of both feet 10/03/2021   Lithium use 04/02/2021   Morbid obesity (HCC) 10/23/2019   Bipolar 2 disorder (HCC) 08/31/2014   OCD (obsessive compulsive disorder)     Conditions to be  addressed/monitored: Rheumatoid Arthritis  Care Plan : RN Care Manager  Updates made by [name], RN since 12/10/2021 12:00 AM     Problem: Rheumatoid Arthritis      Goal: The patient will maintain and manage her symptoms by following  the docotrss plan, taking her medications, and keeping her appointments   Start Date: 08/18/2021  Expected End Date: 01/27/2022  Priority: High  Note:   Current Barriers:  Chronic pain r/t Rheumatoid Arthritis as evidenced by patient stating her pain level is at  a 8/10.    RNCM Clinical Goal(s):  Patient will verbalize understanding of plan for management of pain by describing how unrelieved pain will be managed , demonstrate the ability to pace her activities and keep all appointments  through collaboration with RN Care manager, provider, and care team.   Interventions: 1:1 collaboration with primary care provider regarding development and update of comprehensive plan of care as evidenced by provider attestation and co-signature Inter-disciplinary care team collaboration (see longitudinal plan of care) Evaluation of current treatment plan related to  self management and patient's adherence to plan as established by provider   Pain:  (Status: Goal on Track (progressing): YES.) Long Term                                         Pain assessment performed Medications reviewed Reviewed provider established plan for pain management;-  Discussed importance of adherence to all scheduled medical appointments; Counseled on the importance of reporting any/all new or changed pain symptoms or management strategies to pain management provider; Assessed social determinant of health barriers; - Transportation has been provided for her. 12/10/21: I spoke with Natalie [name] today, and she explained that she is looking for a new Rheumatologist. The physician placed a referral for Transportation and Pharmacy, but Natalie [name] already has [name] transportation  set up; and we reviewed how when to use them, and she understands. She gets her medications from her pharmacy when needed and fills her pill box. Her psychiatrist supplies her psych medications at [name]. She is no longer taking her Enbrel; she said they told her at the Rheumatology office that she should not have any withdrawals from not using it, so she stopped the medication. She is still having issues getting her mail because she needs a key to her mailbox and help from management. We made a conference call to management, and I left a message for them to call me to see if they could help them get a key to their box. As for now, I am unable to send her an AVS. She wants to use something other than My chart.  Patient Goals/Self Care Activities: -Patient/Caregiver will self-administer medications as prescribed as evidenced by self-report/primary caregiver report  -Patient/Caregiver will attend all scheduled provider appointments as evidenced by clinician review of documented attendance to scheduled appointments and patient/caregiver report -Patient/Caregiver will call pharmacy for medication refills as evidenced by patient report and review of pharmacy fill history as appropriate -Patient/Caregiver will call provider office for new  concerns or questions as evidenced by review of documented incoming telephone call notes and patient report -Patient/Caregiver verbalizes understanding of plan -Patient/Caregiver will focus on medication adherence by taking medications as prescribed -Natalie [name] (assistive device) appropriately with all ambulation       [name] RN, BSN, [name] Care Management Coordinator [name] Phone: [phone] I Fax: [phone]

## 2021-12-10 NOTE — Patient Instructions (Signed)
Visit Information  Ms. Julien Girterkins  it was nice speaking with you. Please call me directly [phone] if you have questions about the goals we discussed.  Patient Goals/Self Care Activities: -Patient/Caregiver will self-administer medications as prescribed as evidenced by self-report/primary caregiver report  -Patient/Caregiver will attend all scheduled provider appointments as evidenced by clinician review of documented attendance to scheduled appointments and patient/caregiver report -Patient/Caregiver will call pharmacy for medication refills as evidenced by patient report and review of pharmacy fill history as appropriate -Patient/Caregiver will call provider office for new concerns or questions as evidenced by review of documented incoming telephone call notes and patient report -Patient/Caregiver verbalizes understanding of plan -Patient/Caregiver will focus on medication adherence by taking medications as prescribed -Utilize Walker (assistive device) appropriately with all ambulation       The patient verbalized understanding of instructions, educational materials, and care plan provided today and declined offer to receive copy of patient instructions, educational materials, and care plan.  The patient is unable to receive her mail at this time and does not use mychart.  Follow up Plan: Patient would like continued follow-up.  CCM RNCM will outreach the patient within the next 5 weeks.  Patient will call office if needed prior to next encounter  Juanell Fairlyraci Isadore Bokhari, RN  [phone]

## 2021-12-12 ENCOUNTER — Encounter: Payer: Self-pay | Admitting: Family Medicine

## 2021-12-12 ENCOUNTER — Telehealth (INDEPENDENT_AMBULATORY_CARE_PROVIDER_SITE_OTHER): Payer: Medicaid Other | Admitting: Family Medicine

## 2021-12-12 DIAGNOSIS — B353 Tinea pedis: Secondary | ICD-10-CM

## 2021-12-12 DIAGNOSIS — F3181 Bipolar II disorder: Secondary | ICD-10-CM | POA: Diagnosis not present

## 2021-12-12 DIAGNOSIS — M059 Rheumatoid arthritis with rheumatoid factor, unspecified: Secondary | ICD-10-CM

## 2021-12-12 NOTE — Assessment & Plan Note (Signed)
Discussed pain control and appropriate dose of Tylenol. New referral for Rheumatology placed.

## 2021-12-12 NOTE — Progress Notes (Addendum)
[facility] Telemedicine Visit  Patient consented to have virtual visit and was identified by name and date of birth. Method of visit: Telephone  Encounter participants: Patient: Natalie FramesBriyonda Schoof - located at home Provider: Westley Chandlerarina M Giovany Cosby - located at office Others (if applicable): PJ (Husband)   Chief Complaint: RA and rash   HPI:  Natalie [name] is a pleasant 31 year old presenting for telephone visit.  She was scheduled for an in person visit today for Depo and physical but is having significant pain from her rheumatoid arthritis.  RA Patient reports she missed multiple appointments for her rheumatoid arthritis due to housing meeting.  Her and her husband live in subsidized housing and had to meet with her landlord multiple times over the past month.  She is a hard time getting [facility]  We will reach out to RN Bing ReeColes  to see neck steps.  A new referral for rheumatology has been placed.  She is taking only Tylenol intermittently for her pain right now which is working okay  Mood disorder updated medications, she reports he is no longer seeing a therapist that she graduated  Suspected tinea this is not improved at all she reports.  She reports her feet are red and itchy.  She denies worsening or spreading of the rash she reports they are just painful.  She has been using a scented English soap with lavender on them.  The patient reports history of hemorrhoids.  She is very embarrassed by this.  She has a history of constipation which is severe she reports.  She denies melena but reports sometimes when she wipes it is bright red blood per rectum.  ROS: per HPI  Pertinent PMHx: Rheumatoid arthritis, bipolar disorder  Exam:  Unable to connect via video visit due to their cell phones.  She is speaking in full complete sentences and has normal fluid speech today.  Assessment/Plan:  Tinea pedis of both feet Not improved.  Discussed--recommended using unscented soap. She is to send photo. Likely needs to see Dermatology--unclear if tinea pedis or another condition. Query if dermatitis or very atypical pitted keratolysis? Possibly related to RA as skin manifestation. CMP and RPR ordered to evaluate medication AE and other risk factors.   Rheumatoid arthritis (HCC) Discussed pain control and appropriate dose of Tylenol. New referral for Rheumatology placed.   Healthcare maintenance For contraception she is on Depo, schedule appointment with the nurse on Monday She is additionally due for blood work, this was ordered and scheduled.  History of anemia CBC ordered   Hemorrhoids, in person exam scheduled.  Will obtain CBC as above.  Recommended using stool softener drinking plenty of water.  Pending evaluation may need to see gastroenterology.   Time spent during visit with patient: 12 minutes

## 2021-12-12 NOTE — Assessment & Plan Note (Addendum)
Not improved. Discussed--recommended using unscented soap. She is to send photo. Likely needs to see Dermatology--unclear if tinea pedis or another condition. Query if dermatitis or very atypical pitted keratolysis? Possibly related to RA as skin manifestation. CMP and RPR ordered to evaluate medication AE and other risk factors.

## 2021-12-15 ENCOUNTER — Ambulatory Visit: Payer: Medicaid Other

## 2021-12-15 ENCOUNTER — Other Ambulatory Visit: Payer: Medicaid Other

## 2021-12-16 ENCOUNTER — Ambulatory Visit: Payer: Medicaid Other | Admitting: Rheumatology

## 2021-12-16 ENCOUNTER — Telehealth: Payer: Self-pay

## 2021-12-16 NOTE — Telephone Encounter (Signed)
FYI:  I spoke with husband and informed him that there is only one other office in [place] for rheumatology [facility]) and they also don't accept new medicaid patients.  He voiced understanding and is willing to try [place].  I referred him to Dr. Sharmon Revere at [facility] in [place].  Patient was dismissed from [facility] due to too many missed appointments.    Bintou Lafata,CMA

## 2021-12-16 NOTE — Telephone Encounter (Signed)
Husband calls nurse line requesting an alternative rheumatology location. Husband reports [facility] does not accept medicaid and each visit will be ~ 200 dollars.   Will forward to referral coordinator.

## 2021-12-22 ENCOUNTER — Other Ambulatory Visit: Payer: Medicaid Other

## 2021-12-22 ENCOUNTER — Ambulatory Visit: Payer: Medicaid Other

## 2021-12-24 ENCOUNTER — Other Ambulatory Visit (HOSPITAL_COMMUNITY): Payer: Self-pay

## 2021-12-31 ENCOUNTER — Other Ambulatory Visit: Payer: Medicaid Other

## 2021-12-31 ENCOUNTER — Ambulatory Visit: Payer: Medicaid Other

## 2022-01-06 ENCOUNTER — Telehealth: Payer: Self-pay | Admitting: *Deleted

## 2022-01-06 NOTE — Telephone Encounter (Signed)
Can give single dose of 15 mg toradol--should not be taking any other OTC medications other than Tylenol for pain. Patient is on lithium, which can interact with NSAIDs. However, given significant RA not on therapy, agree with 1 X dose toradol.

## 2022-01-06 NOTE — Telephone Encounter (Signed)
Spouse called and states that rheumatology in [place] never received the referral.  I explained to him that I did fax it in January but that I would call them today to double check the fax number.  Patient is due to come in tomorrow for labs and her depo, she is asking if she can get a toradol injection for pain while she is here also.  I advised him that I would have to run it by Dr. Manson Passey as she would have to give the nurses the ok to administer.  Will forward to MD.  Burnard Hawthorne

## 2022-01-06 NOTE — Telephone Encounter (Signed)
Thanks.  I have made a note on the appt for tomorrow.  Finesse Fielder,CMA

## 2022-01-07 ENCOUNTER — Other Ambulatory Visit: Payer: Medicaid Other

## 2022-01-07 ENCOUNTER — Ambulatory Visit: Payer: Medicaid Other

## 2022-01-09 ENCOUNTER — Telehealth: Payer: Self-pay | Admitting: Family Medicine

## 2022-01-09 DIAGNOSIS — M059 Rheumatoid arthritis with rheumatoid factor, unspecified: Secondary | ICD-10-CM

## 2022-01-09 MED ORDER — PANTOPRAZOLE SODIUM 20 MG PO TBEC: 40.0000 mg | DELAYED_RELEASE_TABLET | Freq: Every day | ORAL | 0 refills | Status: DC

## 2022-01-09 MED ORDER — PREDNISONE 10 MG PO TABS: ORAL_TABLET | ORAL | 0 refills | Status: DC

## 2022-01-09 NOTE — Telephone Encounter (Signed)
Called patient to discuss options.  Discussed potentially restarting Enbrel at prior dose given her control with this medication.  She would very much like this and would like to discuss with her husband.  Reviewed the risks and benefits again we reviewed the risks of infection and reactivation of underlying tuberculosis.  Would retest again at the start of therapy.  For short course pain control over the weekend given her significant flare of rheumatoid disease we will give her short burst of steroids with a taper and PPI.  Reviewed return precautions including bleeding and signs and symptoms of infection. Also reviewed with husband.

## 2022-01-09 NOTE — Telephone Encounter (Signed)
Called patient to discuss concerns from her husband's MyChart message.  She still has not heard from her rheumatologist.  She has been in significant pain from her rheumatoid disease.  Discussed at length.  Messaged RN care coordinator to see if we could expedite referral process.  No contact has been made since January.  Discussed case with Dr. Jennette Kettle and Dr. Deirdre Priest, discussed short-term prednisone burst versus restarting controller medication which was working well (Enbrel). Will look into options and call patient.  [name], MD  Family Medicine Teaching Service

## 2022-01-09 NOTE — Addendum Note (Signed)
Addended byManson Passey: Keenan Dimitrov on: 01/09/2022 04:02 PM   Modules accepted: Orders

## 2022-01-12 ENCOUNTER — Ambulatory Visit: Payer: Medicaid Other

## 2022-01-12 NOTE — Chronic Care Management (AMB) (Signed)
Chronic Care Management   CCM RN Visit Note  01/12/2022 Name: Natalie [name] MRN: [id number] DOB: 03/22/1991  Subjective: Natalie [name] is a 31 y.o. year old female who is a primary care patient of Natalie Chandler, MD. The care management team was consulted for assistance with disease management and care coordination needs.    Engaged with patient by telephone for follow up visit in response to provider referral for case management and/or care coordination services.   Consent to Services:  The patient was given information about Chronic Care Management services, agreed to services, and gave verbal consent prior to initiation of services.  Please see initial visit note for detailed documentation.   Patient agreed to services and verbal consent obtained.    Summary:  The patient feels that her body is fighting against her because she is aching more,but she is taking her medications.  We dicussed her transportation options . See Care Plan below for interventions and patient self-care actives.  Recommendation: The patient may benefit from continuing to take her medications as prescribed, doing light stretching as tolerated, using her rollator, going to all of her appointments and using the transportation that she has available to her. The patient agrees.  Follow up Plan: Patient would like continued follow-up.  CCM RNCM will outreach the patient within the next 5 weeks.  Patient will call office if needed prior to next encounter   Assessment: Review of patient past medical history, allergies, medications, health status, including review of consultants reports, laboratory and other test data, was performed as part of comprehensive evaluation and provision of chronic care management services.   SDOH (Social Determinants of Health) assessments and interventions performed:       Conditions to be addressed/monitored: Rheumatoid Arthritis  Care Plan : RN Care Manager  Updates made by  Natalie Fairly, RN since 01/12/2022 12:00 AM     Problem: Rheumatoid Arthritis      Goal: The patient will maintain and manage her symptoms by following  the docotrss plan, taking her medications, and keeping her appointments   Start Date: 08/18/2021  Expected End Date: 02/27/2022  Priority: High  Note:   Current Barriers:  Chronic pain r/t Rheumatoid Arthritis as evidenced by patient stating her pain level is at  a 8/10.    RNCM Clinical Goal(s):  Patient will verbalize understanding of plan for management of pain by describing how unrelieved pain will be managed , demonstrate the ability to pace her activities and keep all appointments  through collaboration with RN Care manager, provider, and care team.   Interventions: 1:1 collaboration with primary care provider regarding development and update of comprehensive plan of care as evidenced by provider attestation and co-signature Inter-disciplinary care team collaboration (see longitudinal plan of care) Evaluation of current treatment plan related to  self management and patient's adherence to plan as established by provider   Pain:  (Status: Goal on Track (progressing): YES.) Long Term                                         Discussed importance of adherence to all scheduled medical appointments; Counseled on the importance of reporting any/all new or changed pain symptoms or management strategies to pain management provider; Assessed social determinant of health barriers; - Transportation has been provided for her. 01/12/22: II spoke with Natalie [name] to discuss her transportation issues. The patient stated that  she has [facility] transportation and Medicaid. I explained that a Rheumatologist referral placed could [place] I said that Medicaid would [place] if she needed to go. She and her husband explained that they have never used Medicaid. Her husband Natalie [name] said that he has a  cousin that will drive them [place] if needed. I reinforced that she does have Medicaid transportation if she needs it. Natalie [name] is doing ok. She said that it seemed her body was fighting back against her, and she was using her rollator more. She has been on prednisone for a short time and states it helps. I advised her to continue her regimen using her rollator and gentle exercise as tolerated. Natalie [name] also asked that her Natalie [name] level from October 2022 be faxed to Natalie OliveAlemu Mengistu NP at [facility] at [phone]. I faxed the information.  Patient Goals/Self Care Activities: -Patient/Caregiver will self-administer medications as prescribed as evidenced by self-report/primary caregiver report  -Patient/Caregiver will attend all scheduled provider appointments as evidenced by clinician review of documented attendance to scheduled appointments and patient/caregiver report -Patient/Caregiver will call pharmacy for medication refills as evidenced by patient report and review of pharmacy fill history as appropriate -Patient/Caregiver will call provider office for new concerns or questions as evidenced by review of documented incoming telephone call notes and patient report -Patient/Caregiver verbalizes understanding of plan -Patient/Caregiver will focus on medication adherence by taking medications as prescribed -Doristine BosworthUtilize Walker (assistive device) appropriately with all ambulation       Natalie Fairlyraci [name] RN, BSN, [facility] Care Management Coordinator [facility]  Phone: [phone]

## 2022-01-12 NOTE — Patient Instructions (Signed)
Visit Information  Ms. Licano  it was nice speaking with you. Please call me directly [phone] if you have questions about the goals we discussed.  Patient Goals/Self Care Activities: -Patient/Caregiver will self-administer medications as prescribed as evidenced by self-report/primary caregiver report  -Patient/Caregiver will attend all scheduled provider appointments as evidenced by clinician review of documented attendance to scheduled appointments and patient/caregiver report -Patient/Caregiver will call pharmacy for medication refills as evidenced by patient report and review of pharmacy fill history as appropriate -Patient/Caregiver will call provider office for new concerns or questions as evidenced by review of documented incoming telephone call notes and patient report -Patient/Caregiver verbalizes understanding of plan -Patient/Caregiver will focus on medication adherence by taking medications as prescribed -Utilize Walker (assistive device) appropriately with all ambulation      The patient is unable to receive information in the mail due to her not having a mailbox key.  She will call the post office and discuss ab out how to appropriate the key.  Follow up Plan: Patient would like continued follow-up.  CCM RNCM will outreach the patient within the next 5 weeks.  Patient will call office if needed prior to next encounter  [name], RN  [phone]

## 2022-01-15 ENCOUNTER — Ambulatory Visit: Payer: Medicaid Other | Admitting: Dermatology

## 2022-01-15 ENCOUNTER — Telehealth: Payer: Self-pay | Admitting: Family Medicine

## 2022-01-15 DIAGNOSIS — R252 Cramp and spasm: Secondary | ICD-10-CM

## 2022-01-15 NOTE — Telephone Encounter (Signed)
Called patient and she has an appointment with Dr. Manson Passey on 02/13/2022 which she says she will address her issues.  Glennie Hawk, CMA

## 2022-01-15 NOTE — Telephone Encounter (Signed)
Patient would like for Dr. Manson Passey to call her to discuss the issues she is having with the fungus on her feet.   The best call back is [phone].

## 2022-01-15 NOTE — Telephone Encounter (Signed)
Called patient to check in. She had significant benefit from prednisone. Has Rheumatology appointment upcoming. Would like to hold off on Enbrel for now. Appointment scheduled to discuss birth control. Advised on condom use until appointment.   [name], MD  Family Medicine Teaching Service

## 2022-01-15 NOTE — Telephone Encounter (Signed)
Please schedule for virtual visit to discuss her concerns with me. Can also schedule with another provider for in person.  Dorris Singh, MD  Family Medicine Teaching Service

## 2022-01-16 ENCOUNTER — Ambulatory Visit: Payer: Medicaid Other | Admitting: Family Medicine

## 2022-01-16 MED ORDER — BACLOFEN 5 MG PO TABS: 5.0000 mg | ORAL_TABLET | Freq: Three times a day (TID) | ORAL | 0 refills | Status: DC | PRN

## 2022-01-16 NOTE — Telephone Encounter (Signed)
Called patient husband.  Discussed that she needs to be seen if she continues to have such severe pain.  Discussed her ongoing symptoms which include muscle cramps.  Prescribe baclofen, discussed risks and recommended against use of other sedating medications with this.  She does not drive or use alcohol.  If she continues to have pain the patient will need to be seen.  I discussed that I am not immediate immediately available this afternoon and thus if she continues to have severe pain she should proceed to schedule appointment in our clinic or be seen in urgent care.  All questions answered.  Reviewed return precautions.

## 2022-01-16 NOTE — Addendum Note (Signed)
Addended by: Manson Passey, Ladell Lea on: 01/16/2022 08:47 AM   Modules accepted: Orders

## 2022-01-16 NOTE — Addendum Note (Signed)
Addended by: Steva Colder on: 01/16/2022 08:34 AM   Modules accepted: Orders

## 2022-01-16 NOTE — Telephone Encounter (Signed)
Husband calls nurse line requesting a refill on Prednisone.   Husband reports she has been in "constant" pain since yesterday. Husband reports she ran out of the medication 2 days ago.   Will forward to PCP.

## 2022-01-19 ENCOUNTER — Other Ambulatory Visit (HOSPITAL_COMMUNITY): Payer: Self-pay

## 2022-01-20 ENCOUNTER — Telehealth: Payer: Self-pay

## 2022-01-20 ENCOUNTER — Telehealth: Payer: Self-pay | Admitting: *Deleted

## 2022-01-20 NOTE — Telephone Encounter (Signed)
Discussed with Dr. Raymondo Band and Dr. Jennette Kettle. Can send Enbrel to [facility] specialty pharmacy for approval and likely authorization. Before starting therapy, she will need to come in for labs.  Nursing- please call patient and help her schedule lab visit as soon as possible. I will order as future orders.   Natalie Starr, MD  Family Medicine Teaching Service

## 2022-01-20 NOTE — Telephone Encounter (Signed)
Called patient, scheduled for lab visit.  Natalie Starr, MD  Family Medicine Teaching Service

## 2022-01-20 NOTE — Telephone Encounter (Signed)
Called patient to make LAB visit and she states that she is "in a lot of pain and can not move around."  Patient would like for Dr. Manson Passey to give her a call ASAP because the "pain is getting worse."  .Glennie Hawk, CMA

## 2022-01-20 NOTE — Telephone Encounter (Signed)
Husband called today (patient on the line) and states that patient has been having increased pain and was curious if patient could start enbrel.  He said this was discussed in the past and they are interested and trying this now.  Patient was adamant that she didn't want anymore prednisone.  She has an appt with [facility] on 02/09/22 for rheumatology.  Will forward to MD.  Burnard Hawthorne

## 2022-01-22 ENCOUNTER — Other Ambulatory Visit: Payer: Medicaid Other

## 2022-01-22 ENCOUNTER — Other Ambulatory Visit: Payer: Self-pay | Admitting: Family Medicine

## 2022-01-22 ENCOUNTER — Telehealth: Payer: Self-pay | Admitting: Family Medicine

## 2022-01-22 DIAGNOSIS — M05742 Rheumatoid arthritis with rheumatoid factor of left hand without organ or systems involvement: Secondary | ICD-10-CM

## 2022-01-22 NOTE — Telephone Encounter (Signed)
Patient is calling and would like for Dr. Manson Passey to call her to discuss why she had to cancel her appointment and some other issues.   Please call patient at [phone].

## 2022-01-22 NOTE — Telephone Encounter (Signed)
Called patient back. Discussed limitation of assessment over phone. Recommended labs if she wants to start medication. They have opted to wait until March 17th.   [name], MD  Family Medicine Teaching Service

## 2022-01-22 NOTE — Telephone Encounter (Signed)
Pt husband calls back.  Discussed that we ask for 24 hours for a callback.  He is understanding of policy. Jone Baseman, CMA

## 2022-01-30 ENCOUNTER — Telehealth: Payer: Self-pay

## 2022-01-30 NOTE — Telephone Encounter (Signed)
Patient calls nurse line reporting continued pain caused by arthritis.  ? ?Patient reports she has an apt with specialist on 3/13. Patient would like recommendations from PCP for pain control until she can be seen. ? ?Patient declined an apt at this time.  ? ?Will forward to PCP.  ?

## 2022-01-30 NOTE — Telephone Encounter (Signed)
Called and spoke with patient. Discussed she would need to be seen given symptoms. Appointment scheduled with Dr. Robyne PeersGanta. ? ?Natalie Starrarina Jaide Hillenburg, MD  ?Family Medicine Teaching Service  ? ? ?

## 2022-02-02 NOTE — Telephone Encounter (Signed)
Patient returns call to nurse line regarding previous message for hemp gummies. Advised patient that we would reach out once provider has had a chance to review this request.  ? ?Talbot Grumbling, RN ? ?

## 2022-02-02 NOTE — Telephone Encounter (Signed)
Called patient and discussed I  do not recommend hemp gummies  given a lack of regulation and uncertain safety. ?

## 2022-02-02 NOTE — Telephone Encounter (Signed)
Patient called back and wanted to see if provider thinks she can try hemp gummies for pain.  Will forward to MD.  Johnney Ou ? ? ? ? ?*please route to red team* ?

## 2022-02-02 NOTE — Telephone Encounter (Signed)
Called patient and and discussed multiple options including prednisone burst, ice and heat, ongoing management with Tylenol and supportive care.  She cannot take NSAIDs due to lithium use and she is very against this.  She is to call back in coming days with decision. ?

## 2022-02-02 NOTE — Telephone Encounter (Signed)
Patient calls back.  ? ?Patient is requesting to discuss pain management over the next few days until she can be seen.  ? ?Patient reports tylenol is not working.  ?

## 2022-02-06 ENCOUNTER — Telehealth: Payer: Self-pay | Admitting: Family Medicine

## 2022-02-06 ENCOUNTER — Ambulatory Visit: Payer: Medicaid Other | Admitting: Family Medicine

## 2022-02-06 NOTE — Telephone Encounter (Signed)
Patients husband is returning missed call from our office. He would like for whoever called to call him back concerning patient.  ?

## 2022-02-06 NOTE — Telephone Encounter (Signed)
Please call patient and let her know we can do blood work on Friday. As she had Rheumatology on Monday, I am sure they will draw labs at that visit. Please remind her it is important to attend her Rheumatology visit on Monday.  ?

## 2022-02-06 NOTE — Telephone Encounter (Signed)
Patient would like for Dr. Manson PasseyBrown to call her. She had to cancel her appointment today due to her husband being sick. She has questions about what she should do about future blood work.  ? ?The best call back is [phone] ?

## 2022-02-09 ENCOUNTER — Ambulatory Visit: Payer: Medicaid Other

## 2022-02-09 NOTE — Chronic Care Management (AMB) (Signed)
?Chronic Care Management  ? ?CCM RN Visit Note ? ?02/09/2022 ?Name: Natalie [name] MRN: [id number] DOB: 20-Jun-1991 ? ?Subjective: ?Natalie [name] is a 31 y.o. year old female who is a primary care patient of Martyn Malay, MD. The care management team was consulted for assistance with disease management and care coordination needs.   ? ?Engaged with patient by telephone for follow up visit in response to provider referral for case management and/or care coordination services.  ? ?Consent to Services:  ?The patient was given information about Chronic Care Management services, agreed to services, and gave verbal consent prior to initiation of services.  Please see initial visit note for detailed documentation.  ? ?Patient agreed to services and verbal consent obtained.  ? ? ?Summary:  The patient  continues to experience difficulty with pain and soreness in her body.  She has an appointment with her new Rheumatologist in [place] Today..  See Care Plan below for interventions and patient self-care actives. ? ?Recommendation: The patient may benefit from continuing to take her medications as prescribed,use her walker, doing mild stretching as tolerated, going to her appointment today, and finding out about the  appointment schedule at the new office so she can understand their polices. ? ?Follow up Plan: Patient would like continued follow-up.  CCM RNCM will outreach the patient within the next 5 weeks.  Patient will call office if needed prior to next encounter ? ? ?Assessment: Review of patient past medical history, allergies, medications, health status, including review of consultants reports, laboratory and other test data, was performed as part of comprehensive evaluation and provision of chronic care management services.  ? ?SDOH (Social Determinants of Health) assessments and interventions performed:  No ? ? ?Care Plan : RN Care Manager  ?Updates made by Lazaro Arms, RN since 02/09/2022 12:00 AM  ?   ? ?Problem: Rheumatoid Arthritis   ?  ? ?Goal: The patient will maintain and manage her symptoms by following  the doctors plan, taking her medications, and keeping her appointments   ?Start Date: 08/18/2021  ?Expected End Date: 03/27/2022  ?Priority: High  ?Note:   ?Current Barriers:  ?Chronic pain r/t Rheumatoid Arthritis as evidenced by patient stating her pain level is at  a 8/10.  ? ? ?RNCM Clinical Goal(s):  ?Patient will verbalize understanding of plan for management of pain by describing how unrelieved pain will be managed , demonstrate the ability to pace her activities and keep all appointments  through collaboration with RN Care manager, provider, and care team.  ? ?Interventions: ?1:1 collaboration with primary care provider regarding development and update of comprehensive plan of care as evidenced by provider attestation and co-signature ?Inter-disciplinary care team collaboration (see longitudinal plan of care) ?Evaluation of current treatment plan related to  self management and patient's adherence to plan as established by provider ? ? ?Pain:  (Status: Goal on Track (progressing): YES.) Long Term                                         ?Discussed importance of adherence to all scheduled medical appointments; ?Counseled on the importance of reporting any/all new or changed pain symptoms or management strategies to pain management provider; ?02/09/22: Natalie [name] said she has an appointment today with her new Rheumatologist, Dr. Fayne Norrie. She said she also has an appointment with Dr. Owens Shark on the 17th. Her transportation to the visit  today is her husband's cousin, [name]. She is happy to be going because she states her body is hurting/sore, rates the pain at an 8/10, and has problems walking. I advised her to inform the physician about all of her problems and to find out about their appointment policy. She verbalized understanding. ? ? ?Patient Goals/Self Care Activities: ?-Patient/Caregiver will  self-administer medications as prescribed as evidenced by self-report/primary caregiver report  ?-Patient/Caregiver will attend all scheduled provider appointments as evidenced by clinician review of documented attendance to scheduled appointments and patient/caregiver report ?-Patient/Caregiver will call pharmacy for medication refills as evidenced by patient report and review of pharmacy fill history as appropriate ?-Patient/Caregiver will call provider office for new concerns or questions as evidenced by review of documented incoming telephone call notes and patient report ?-Patient/Caregiver verbalizes understanding of plan ?-Patient/Caregiver will focus on medication adherence by taking medications as prescribed ?-Utilize Walker (assistive device) appropriately with all ambulation ?-Advised her to find out the appointment policy at the new office. ? ? ?  ? ? ? ? ?Lazaro Arms RN, BSN, [place] ?Care Management Coordinator ?[place]  ?Phone: [phone]  ?  ? ? ? ? ? ? ? ?

## 2022-02-09 NOTE — Patient Instructions (Signed)
Visit Information ? ?Ms. Covill  it was nice speaking with you. Please call me directly [phone] if you have questions about the goals we discussed. ? ?Patient Goals/Self Care Activities: ?-Patient/Caregiver will self-administer medications as prescribed as evidenced by self-report/primary caregiver report  ?-Patient/Caregiver will attend all scheduled provider appointments as evidenced by clinician review of documented attendance to scheduled appointments and patient/caregiver report ?-Patient/Caregiver will call pharmacy for medication refills as evidenced by patient report and review of pharmacy fill history as appropriate ?-Patient/Caregiver will call provider office for new concerns or questions as evidenced by review of documented incoming telephone call notes and patient report ?-Patient/Caregiver verbalizes understanding of plan ?-Patient/Caregiver will focus on medication adherence by taking medications as prescribed ?-Utilize Walker (assistive device) appropriately with all ambulation ?-Advised her to find out the appointment policy at the new office. ? ? ?The patient verbalized understanding of instructions, educational materials, and care plan provided today and agreed to receive a mailed copy of patient instructions, educational materials, and care plan.  ? ?Follow up Plan: Patient would like continued follow-up.  CCM RNCM will outreach the patient within the next 5 weeks.  Patient will call office if needed prior to next encounter ? ?Lazaro Arms, RN ? ?[phone]  ?

## 2022-02-13 ENCOUNTER — Other Ambulatory Visit: Payer: Self-pay

## 2022-02-13 ENCOUNTER — Ambulatory Visit: Payer: Medicaid Other | Admitting: Family Medicine

## 2022-02-13 ENCOUNTER — Telehealth (INDEPENDENT_AMBULATORY_CARE_PROVIDER_SITE_OTHER): Payer: Medicaid Other | Admitting: Family Medicine

## 2022-02-13 ENCOUNTER — Encounter: Payer: Self-pay | Admitting: Family Medicine

## 2022-02-13 DIAGNOSIS — B353 Tinea pedis: Secondary | ICD-10-CM | POA: Diagnosis not present

## 2022-02-13 DIAGNOSIS — M059 Rheumatoid arthritis with rheumatoid factor, unspecified: Secondary | ICD-10-CM | POA: Diagnosis not present

## 2022-02-13 MED ORDER — MICONAZOLE NITRATE 2 % EX CREA: 1.0000 "application " | TOPICAL_CREAM | Freq: Two times a day (BID) | CUTANEOUS | 3 refills | Status: DC

## 2022-02-13 NOTE — Progress Notes (Signed)
[facility] ?Telemedicine Visit ? ?Patient consented to have virtual visit and was identified by name and date of birth. ?Method of visit: Telephone ? ?Encounter participants: ?Patient: Natalie [name] - located at home  ?Provider: Westley Chandler - located at office  ?Others (if applicable): PJ Daoud  ? ?Chief Complaint: follow up Rheumatology  ? ?HPI: ? ?Natalie [name] is a 31 year old with a history of rheumatoid arthritis now back on Enbrel and bipolar disorder presenting today for routine follow-up.  She recently saw an rheumatologist who she liked.  She is back on Enbrel and feels very much better.  She feels her mood is good. ? ?Contraception ?Has not had menstrual period in very long time. Has not been having intercourse.  She is overdue for Depo.  She does not use condoms.  She is not interested in another form of contraception at this time. ? ?Feet rash ?The patient has had the presumed diagnosis of tinea pedis.  She says this has intermittently improved over the past few months but now is back in full force.  She want something else to treat this.  She is not sure if she wants to see podiatry.  She reports the skin is flaking and the underneath skin is a lighter color. ?ROS: per HPI ? ?Pertinent PMHx: Updated and reviewed as appropriate ? ?Exam:  ?Talkative appropriate and speaking in full sentences ? ?Assessment/Plan: ? ?Suspected tinea pedis, offered and recommended referral to podiatry given failure of both oral and topical therapy as this may be a different condition.  She declined this.  A new topical antifungal was sent to pharmacy. ? ?Rheumatoid arthritis (HCC) ?Doing well and well controlled.  We discussed the long-term side effects of prednisone.  Medication list updated. ?  ?Contraception spent much of visit discussing options.  She would like to continue Depo.  Encouraged her to schedule an earlier appointment to get her shot soon as possible.  She would like to get  this in April.  Discussed risk recommended condoms and prenatal vitamins until her visit. ? ?Time spent during visit with patient: 6 minutes ? ?

## 2022-02-13 NOTE — Assessment & Plan Note (Signed)
Doing well and well controlled.  We discussed the long-term side effects of prednisone.  Medication list updated. ?

## 2022-03-12 ENCOUNTER — Ambulatory Visit: Payer: Medicaid Other

## 2022-03-12 ENCOUNTER — Telehealth: Payer: Self-pay | Admitting: Family Medicine

## 2022-03-12 DIAGNOSIS — L309 Dermatitis, unspecified: Secondary | ICD-10-CM

## 2022-03-12 NOTE — Telephone Encounter (Signed)
Patient left a message on the referral line requesting to be sent to [facility] for dermatology.   ?If the patient needs: ? ? An appointment - route response to [facility] admin ? A callback from clinic staff - route response to your team ? ? ?Only route to RN Team: form completions or if RN team directly requests response sent to them ? ? ?Kymiah Araiza, CMA ? ? ?

## 2022-03-12 NOTE — Telephone Encounter (Signed)
Called patient to discuss.  She also is a worsening neck rash.  Discussed and offered biopsy on Monday.  She is very hesitant to do this.  The rash is not pruritic.  It is not progressive or bothering her at this point.  She just does not like how it looks.  Close follow-up scheduled in a few days all questions answered. ?

## 2022-03-12 NOTE — Addendum Note (Signed)
Addended byManson Passey: Zondra Lawlor on: 03/12/2022 10:48 AM ? ? Modules accepted: Orders ? ?

## 2022-03-12 NOTE — Telephone Encounter (Signed)
Patient would like for Dr. Manson Passey to call her to discuss rash on her skin that she believes is due to her medication. I informed patient of her appointment on Monday 03/16/22 but she said she needs to discuss this before then.  ? ?Please call patient back at [phone] ?

## 2022-03-13 NOTE — Patient Instructions (Signed)
Visit Information ? ?[name]  it was nice speaking with you. Please call me directly [phone] if you have questions about the goals we discussed. ? ?  ?Patient Goals/Self Care Activities: ?-Patient/Caregiver will self-administer medications as prescribed as evidenced by self-report/primary caregiver report  ?-Patient/Caregiver will attend all scheduled provider appointments as evidenced by clinician review of documented attendance to scheduled appointments and patient/caregiver report ?-Patient/Caregiver will call pharmacy for medication refills as evidenced by patient report and review of pharmacy fill history as appropriate ?-Patient/Caregiver will call provider office for new concerns or questions as evidenced by review of documented incoming telephone call notes and patient report ?-Patient/Caregiver verbalizes understanding of plan ?-Patient/Caregiver will focus on medication adherence by taking medications as prescribed ?-Utilize Walker (assistive device) appropriately with all ambulation ?-Advised her to find out the appointment policy at the new office. ?  ?  ? ? ?Patient verbalizes understanding of instructions and care plan provided today and agrees to view in [place]. Active MyChart status confirmed with patient.   ? ?Follow up Plan: Patient would like continued follow-up.  CCM RNCM will outreach the patient within the next 5 weeks.  Patient will call office if needed prior to next encounter ? ?Lazaro Arms, RN ? ?[phone]  ?

## 2022-03-13 NOTE — Chronic Care Management (AMB) (Signed)
?Chronic Care Management  ? ?CCM RN Visit Note ? ?03/13/2022 ?Name: Natalie [name] MRN: [id number] DOB: 01/06/1991 ? ?Subjective: ?Natalie [name] is a 31 y.o. year old female who is a primary care patient of Westley Chandler, MD. The care management team was consulted for assistance with disease management and care coordination needs.   ? ?Engaged with patient by telephone for follow up visit in response to provider referral for case management and/or care coordination services.  ? ?Consent to Services:  ?The patient was given information about Chronic Care Management services, agreed to services, and gave verbal consent prior to initiation of services.  Please see initial visit note for detailed documentation.  ? ?Patient agreed to services and verbal consent obtained.  ? ? ?Summary:  The patient continues to maintain positive progress with care plan goals. See Care Plan below for interventions and patient self-care actives. ? ?Recommendation: The patient may benefit from taking medications as prescribed, exercising as tolerated, and The patient agrees. ? ?Follow up Plan: Patient would like continued follow-up.  CCM RNCM will outreach the patient within the next 5 weeks.  Patient will call office if needed prior to next encounter ? ?Assessment: Review of patient past medical history, allergies, medications, health status, including review of consultants reports, laboratory and other test data, was performed as part of comprehensive evaluation and provision of chronic care management services.  ? ?SDOH (Social Determinants of Health) assessments and interventions performed:   ? ?CCM Care Plan ? ?Conditions to be addressed/monitored: Rheumatoid Arthritis ? ?Care Plan : RN Care Manager  ?Updates made by [name], RN since 03/13/2022 12:00 AM  ?  ? ?Problem: Rheumatoid Arthritis   ?  ? ?Goal: The patient will maintain and manage her symptoms by following  the doctors plan, taking her medications, and keeping her  appointments   ?Start Date: 08/18/2021  ?Expected End Date: 05/29/2022  ?Priority: High  ?Note:   ?Current Barriers:  ?Chronic pain r/t Rheumatoid Arthritis as evidenced by patient stating her pain level is at  a 8/10.  ? ? ?RNCM Clinical Goal(s):  ?Patient will verbalize understanding of plan for management of pain by describing how unrelieved pain will be managed , demonstrate the ability to pace her activities and keep all appointments  through collaboration with RN Care manager, provider, and care team.  ? ?Interventions: ?1:1 collaboration with primary care provider regarding development and update of comprehensive plan of care as evidenced by provider attestation and co-signature ?Inter-disciplinary care team collaboration (see longitudinal plan of care) ?Evaluation of current treatment plan related to  self management and patient's adherence to plan as established by provider ? ? ?Pain:  (Status: Goal on Track (progressing): YES.) Long Term                                         ?Discussed importance of adherence to all scheduled medical appointments; ?Counseled on the importance of reporting any/all new or changed pain symptoms or management strategies to pain management provider; ?03/13/22: The patient states that she has a new physician for rheumatology, and she likes her. She is taking prednisone, for now, to help with pain but will not be on it long because it can affect her kidneys and Enbrel. She is walking with her walker. She said that she is doing some stretching off and on. I told her she could do some stretching  while sitting; it can help with the stiffness. ? ? ?Patient Goals/Self Care Activities: ?-Patient/Caregiver will self-administer medications as prescribed as evidenced by self-report/primary caregiver report  ?-Patient/Caregiver will attend all scheduled provider appointments as evidenced by clinician review of documented attendance to scheduled appointments and patient/caregiver  report ?-Patient/Caregiver will call pharmacy for medication refills as evidenced by patient report and review of pharmacy fill history as appropriate ?-Patient/Caregiver will call provider office for new concerns or questions as evidenced by review of documented incoming telephone call notes and patient report ?-Patient/Caregiver verbalizes understanding of plan ?-Patient/Caregiver will focus on medication adherence by taking medications as prescribed ?-Utilize Walker (assistive device) appropriately with all ambulation ?-Advised her to find out the appointment policy at the new office. ? ? ?  ? ?[name] RN, BSN, CPC ?Care Management Coordinator ?[facility]  ?Phone: [phone]  ?  ? ? ? ? ? ? ?

## 2022-03-16 ENCOUNTER — Ambulatory Visit: Payer: Medicaid Other | Admitting: Family Medicine

## 2022-03-26 NOTE — Progress Notes (Signed)
? ? ?SUBJECTIVE:  ? ?CHIEF COMPLAINT: Depo, discuss rash  ?HPI:  ? ?Kathalene FramesBriyonda Tatum is a 31 y.o.  with history notable for bipolar disorder on lithium and RA on Enbrel  presenting for rash.  ? ?In regards to her feet rash, she reports the foot rash is improved.  She has had a worsening nonpruritic rash on her arms and elbows.  No fevers weight loss chest pain.  She does have joint symptoms.  These are longstanding and improved considerably.  She uses a scented soap.  She does not use any moisturizer. ? ?Patient reports compliance with her medications.  She reports her mood is okay.  She is due for her Depo shot today.  She does not regularly have menses she thinks this is due to the Depo shot. ? ?We reviewed her weight trend.  She reports a change in appetite since feeling better with her rheumatoid disease.  She has also been on prednisone intermittently.  Currently only on Enbrel. No polyuria or polydipsia.  No chest pain fevers or other new symptoms.  She does report overall feeling relatively unsteady on her feet and some joint achiness.  Her right hand and arm in particular bother her.  This has been longstanding since the onset of her rheumatoid disease.  This is improved with the Enbrel.  No neck pain numbness or changes in the weakness.  The weakness she thinks is primarily due to the pain in her hand and elbow. ?PERTINENT  PMH / PSH/Family/Social History : Updated and reviewed as appropriate. ? ?OBJECTIVE:  ? ?BP 132/79   Pulse (!) 141   Wt 230 lb (104.3 kg)   SpO2 97%   BMI 39.48 kg/m?   ?Today's weight:  ?Last Weight  Most recent update: 03/27/2022  9:35 AM  ? ? Weight  ?104.3 kg (230 lb)  ?      ? ?  ? ?Review of prior weights: ?Filed Weights  ? 03/27/22 0934  ?Weight: 230 lb (104.3 kg)  ? ? ?regular rate and rhythm no murmurs rubs or gallops.  Lungs clear bilaterally to auscultation. ?Gait is slow, using walker  ?Bilateral upper extremities examined.  Flaking and significant xerosis is present with  hyperpigmentation and flaking on the neck arms feet and lower extremities.  Bilateral upper extremities examined.  She has good range of motion although she is slow with the right arm.  Symmetric grip strength on right as compared to left.  Some decreased elbow flexion 4 out of 5 on right as compared to left due to pain.  4-5 deltoid strength on the right as compared to left due to pain. ? ?KOH negative for fungi  ? ?ASSESSMENT/PLAN:  ? ?Morbid obesity (HCC) ?A1c today, lipids at follow up. Discussed.  ? ?Rheumatoid arthritis (HCC) ?Suspect her right upper extremity and hand pain are due to her RA.  No acute onset of weakness or other paresthesias suggestive of cerebrovascular cause.  We will optimize her management with home physical therapy and occupational therapy to improve strength.  The patient request that PCS form be filled out today.  This is appropriate given her severity of symptoms. ? ?Xerosis of skin ?Discussed with Dr. Deirdre Priesthambliss.  Suspect this is because of her hyperpigmentation.  We discussed using nonsensitive soap, Vaseline I sent in triamcinolone for her to use on her neck and elbows only.  She can also use this on the feet.  KOH was negative.  Differential also includes CARP, has dermatology follow-up scheduled.  Could consider biopsy which was previously offered to the patient she declined. ?  ?Leukocytosis, noted on last labs, was on steroids, repeat today. ? ?Contraception, POC pregnancy negative, not sexually active X1 month, Depo today. ? ?HCM  ?UTD- physical and discuss weight in July  ? ? ? ?[name], MD  ?Family Medicine Teaching Service  ?[facility]  ? ? ?

## 2022-03-27 ENCOUNTER — Ambulatory Visit (INDEPENDENT_AMBULATORY_CARE_PROVIDER_SITE_OTHER): Payer: Medicaid Other | Admitting: Family Medicine

## 2022-03-27 ENCOUNTER — Other Ambulatory Visit: Payer: Self-pay

## 2022-03-27 ENCOUNTER — Encounter: Payer: Self-pay | Admitting: Family Medicine

## 2022-03-27 VITALS — BP 132/79 | HR 141 | Wt 230.0 lb

## 2022-03-27 DIAGNOSIS — R635 Abnormal weight gain: Secondary | ICD-10-CM | POA: Diagnosis not present

## 2022-03-27 DIAGNOSIS — L853 Xerosis cutis: Secondary | ICD-10-CM | POA: Diagnosis not present

## 2022-03-27 DIAGNOSIS — Z3042 Encounter for surveillance of injectable contraceptive: Secondary | ICD-10-CM | POA: Diagnosis not present

## 2022-03-27 DIAGNOSIS — M059 Rheumatoid arthritis with rheumatoid factor, unspecified: Secondary | ICD-10-CM

## 2022-03-27 LAB — POCT SKIN KOH: Skin KOH, POC: NEGATIVE

## 2022-03-27 LAB — POCT URINE PREGNANCY: Preg Test, Ur: NEGATIVE

## 2022-03-27 MED ORDER — MEDROXYPROGESTERONE ACETATE 150 MG/ML IM SUSY
150.0000 mg | PREFILLED_SYRINGE | Freq: Once | INTRAMUSCULAR | Status: AC
  Administered 2022-03-27: 150 mg via INTRAMUSCULAR

## 2022-03-27 MED ORDER — TRIAMCINOLONE ACETONIDE 0.5 % EX OINT: 1.0000 "application " | TOPICAL_OINTMENT | Freq: Two times a day (BID) | CUTANEOUS | 1 refills | Status: DC

## 2022-03-27 NOTE — Patient Instructions (Addendum)
It was wonderful to see you today. ? ?Please bring ALL of your medications with you to every visit.  ? ?Today we talked about: ? ?-Use unscented Dove on your neck, feet, and hands ? ?Vaseline everywhere--twice per day ? ?I sent in a medication to use on your neck and arms ? ?I referred you to home health ? ?I will complete your paperwork---please allow 7-10 business days for this--this can take a while  ? ? ? ?Thank you [facility].  ? ?Please call [phone] with any questions about today's appointment. ? ?Please be sure to schedule follow up at the front  desk before you leave today.  ? ?Terisa Starrarina Gehrig Patras, MD  ?Family Medicine  ? ?

## 2022-03-27 NOTE — Assessment & Plan Note (Signed)
A1c today, lipids at follow up. Discussed.  ?

## 2022-03-27 NOTE — Assessment & Plan Note (Signed)
Discussed with Dr. Erin Hearing.  Suspect this is because of her hyperpigmentation.  We discussed using nonsensitive soap, Vaseline I sent in triamcinolone for her to use on her neck and elbows only.  She can also use this on the feet.  KOH was negative.  Differential also includes CARP, has dermatology follow-up scheduled.  Could consider biopsy which was previously offered to the patient she declined. ?

## 2022-03-27 NOTE — Addendum Note (Signed)
Addended by: Junious Dresser on: 03/27/2022 10:41 AM ? ? Modules accepted: Orders ? ?

## 2022-03-27 NOTE — Assessment & Plan Note (Signed)
Suspect her right upper extremity and hand pain are due to her RA.  No acute onset of weakness or other paresthesias suggestive of cerebrovascular cause.  We will optimize her management with home physical therapy and occupational therapy to improve strength.  The patient request that PCS form be filled out today.  This is appropriate given her severity of symptoms. ?

## 2022-03-28 LAB — HEMOGLOBIN A1C
Est. average glucose Bld gHb Est-mCnc: 91 mg/dL
Hgb A1c MFr Bld: 4.8 % (ref 4.8–5.6)

## 2022-03-28 LAB — CBC
Hematocrit: 40.7 % (ref 34.0–46.6)
Hemoglobin: 13.2 g/dL (ref 11.1–15.9)
MCH: 29.7 pg (ref 26.6–33.0)
MCHC: 32.4 g/dL (ref 31.5–35.7)
MCV: 92 fL (ref 79–97)
Platelets: 273 10*3/uL (ref 150–450)
RBC: 4.45 x10E6/uL (ref 3.77–5.28)
RDW: 14 % (ref 11.7–15.4)
WBC: 15.9 10*3/uL — ABNORMAL HIGH (ref 3.4–10.8)

## 2022-04-01 ENCOUNTER — Telehealth: Payer: Self-pay | Admitting: Family Medicine

## 2022-04-01 DIAGNOSIS — M059 Rheumatoid arthritis with rheumatoid factor, unspecified: Secondary | ICD-10-CM

## 2022-04-01 NOTE — Telephone Encounter (Signed)
PCS form received during visit. Form completed, placed on Bianca's desk for review.  ? ?[name], MD  ?Family Medicine Teaching Service  ? ?

## 2022-04-01 NOTE — Telephone Encounter (Signed)
Called with results. Discussed current medications. Still taking prednisone 10 mg. ?- Stop prednisone, she is to use PRN.  ?- She will message if pain flares. ? ?[name], MD  ?Family Medicine Teaching Service  ? ?

## 2022-04-01 NOTE — Addendum Note (Signed)
Addended byOwens Shark, Sareena Odeh on: 04/01/2022 10:26 AM ? ? Modules accepted: Orders ? ?

## 2022-04-03 ENCOUNTER — Telehealth: Payer: Self-pay | Admitting: *Deleted

## 2022-04-03 NOTE — Chronic Care Management (AMB) (Signed)
?  Care Coordination ?Note ? ?04/03/2022 ?Name: Blu Fejeran MRN: [id number] DOB: 02/14/1991 ? ?Linder Truelove is a 31 y.o. year old female who is a primary care patient of Westley Chandler, MD and is actively engaged with the care management team. I reached out to [name] by phone today to assist with scheduling an initial visit with the BSW ? ?Follow up plan: ?Unsuccessful telephone outreach attempt made. A HIPAA compliant phone message was left for the patient providing contact information and requesting a return call.  ?The care management team will reach out to the patient again over the next 7 days.  ?If patient returns call to provider office, please advise to call Embedded Care Management Care Guide Misty Stanley at [phone]. ? ?Gwenevere Ghazi  ?Care Guide, Embedded Care Coordination ?[place]  [facility]  ?Direct Dial: [phone] ? ?

## 2022-04-09 NOTE — Chronic Care Management (AMB) (Signed)
?  Care Coordination ?Note ? ?04/09/2022 ?Name: Natalie [name] MRN: [id number] DOB: 1991-03-14 ? ?Natalie [name] is a 31 y.o. year old female who is a primary care patient of Westley Chandler, MD and is actively engaged with the care management team. I reached out to [name] by phone today to assist with scheduling an initial visit with the BSW and follow up with RNCM.  ? ?Follow up plan: ?Unsuccessful telephone outreach attempt made. A HIPAA compliant phone message was left for the patient providing contact information and requesting a return call.  ?The care management team will reach out to the patient again over the next 7 days.  ?If patient returns call to provider office, please advise to call Embedded Care Management Care Guide Misty Stanley at [phone]. ? ?Natalie [name]  ?Care Guide, Embedded Care Coordination ?[place]  [facility]  ?Direct Dial: [phone] ? ?

## 2022-04-13 ENCOUNTER — Telehealth: Payer: Medicaid Other

## 2022-04-13 ENCOUNTER — Ambulatory Visit: Payer: Medicaid Other

## 2022-04-13 NOTE — Chronic Care Management (AMB) (Signed)
?  Care Coordination ?Note ? ?04/13/2022 ?Name: [name] MRN: [id number] DOB: [birth year] ? ?Natalie [name] is a 31 y.o. year old female who is a primary care patient of Westley Chandler, MD and is actively engaged with the care management team. I reached out to [name] by phone today to assist with re-scheduling a follow up visit with the RN Case Manager ? ?Follow up plan: ?Telephone appointment with care management team member scheduled for:5/15 @415  ?Pt called back RNCM ? ?Gwenevere Ghazi  ?Care Guide, Embedded Care Coordination ?[place]  [facility]  ?Direct Dial: [phone] ? ?

## 2022-04-13 NOTE — Patient Instructions (Signed)
Visit Information ? ?Ms. Julien Girterkins  it was nice speaking with you. Please call me directly [phone] if you have questions about the goals we discussed. ? ?Patient Goals/Self Care Activities: ?-Patient/Caregiver will self-administer medications as prescribed as evidenced by self-report/primary caregiver report  ?-Patient/Caregiver will attend all scheduled provider appointments as evidenced by clinician review of documented attendance to scheduled appointments and patient/caregiver report ?-Patient/Caregiver will call pharmacy for medication refills as evidenced by patient report and review of pharmacy fill history as appropriate ?-Patient/Caregiver will call provider office for new concerns or questions as evidenced by review of documented incoming telephone call notes and patient report ?-Patient/Caregiver verbalizes understanding of plan ?-Patient/Caregiver will focus on medication adherence by taking medications as prescribed ?-Utilize Walker (assistive device) appropriately with all ambulation ?-Take her phone to the appointment to get technical help ? ? ?Patient verbalizes understanding of instructions and care plan provided today and agrees to view in MyChart. Active MyChart status confirmed with patient.   ? ?Follow up Plan: Patient would like continued follow-up.  CCM RNCM will outreach the patient within the next 5 weeks.  Patient will call office if needed prior to next encounter ? ?Juanell Fairlyraci Laquon Emel, RN ? ?[phone]  ?

## 2022-04-13 NOTE — Chronic Care Management (AMB) (Signed)
?Chronic Care Management  ? ?CCM RN Visit Note ? ?04/13/2022 ?Name: Natalie [name] MRN: [id number] DOB: 10/07/1991 ? ?Subjective: ?Natalie [name] is a 31 y.o. year old female who is a primary care patient of Westley ChandlerBrown, Carina M, MD. The care management team was consulted for assistance with disease management and care coordination needs.   ? ?Engaged with patient by telephone for follow up visit in response to provider referral for case management and/or care coordination services.  ? ?Consent to Services:  ?The patient was given information about Chronic Care Management services, agreed to services, and gave verbal consent prior to initiation of services.  Please see initial visit note for detailed documentation.  ? ?Patient agreed to services and verbal consent obtained.  ? ? ?Summary: The patient is still having pain rating it a 6/10.  She also mentioned that she has been seeing [facility], but her last two virtual appointments were unsuccessful as he could not see her. She has an in-person meeting on June 1st. See Care Plan below for interventions and patient self-care actives. ? ?Recommendation: The patient may benefit from taking medications as prescribed, exercising as tolerated, monitoring food intake, and The patient agrees. ? ?Follow up Plan: Patient would like continued follow-up.  CCM RNCM will outreach the patient within the next 5 weeks.  Patient will call office if needed prior to next encounter ? ? ?Assessment: Review of patient past medical history, allergies, medications, health status, including review of consultants reports, laboratory and other test data, was performed as part of comprehensive evaluation and provision of chronic care management services.  ? ?SDOH (Social Determinants of Health) assessments and interventions performed:   ? ?CCM Care Plan ? ?Conditions to be addressed/monitored: Rheumatoid Arthritis ? ?Care Plan : RN Care Manager  ?Updates made by Juanell Fairlyoles, Verlene Glantz, RN since  04/13/2022 12:00 AM  ?  ? ?Problem: Rheumatoid Arthritis   ?  ? ?Goal: The patient will maintain and manage her symptoms by following  the doctors plan, taking her medications, and keeping her appointments   ?Start Date: 08/18/2021  ?Expected End Date: 05/29/2022  ?Priority: High  ?Note:   ?Current Barriers:  ?Chronic pain r/t Rheumatoid Arthritis as evidenced by patient stating her pain level is at  a 8/10.  ? ? ?RNCM Clinical Goal(s):  ?Patient will verbalize understanding of plan for management of pain by describing how unrelieved pain will be managed , demonstrate the ability to pace her activities and keep all appointments  through collaboration with RN Care manager, provider, and care team.  ? ?Interventions: ?1:1 collaboration with primary care provider regarding development and update of comprehensive plan of care as evidenced by provider attestation and co-signature ?Inter-disciplinary care team collaboration (see longitudinal plan of care) ?Evaluation of current treatment plan related to  self management and patient's adherence to plan as established by provider ? ? ?Pain:  (Status: Goal on Track (progressing): YES.) Long Term                                         ?Discussed importance of adherence to all scheduled medical appointments; ?Counseled on the importance of reporting any/all new or changed pain symptoms or management strategies to pain management provider; ?04/13/22: The patient reported persistent pain from rheumatoid arthritis, rating it at 6/10. She is taking Embrel and has been doing stretches, but she used to do some light  dancing at home which she found helpful and wants to resume. I advised her to proceed cautiously and only do what she could tolerate.  ? ?She also mentioned that she has been seeing Dr. Lester Kinsman, but her last two virtual appointments were unsuccessful as he could not see her. She has an in-person meeting on June 1st, which she finds painful, and she would prefer a virtual  option. I suggested she bring her phone to the appointment and ask for a test to see if the technical issues could be resolved. This intervention could allow for future virtual appointments. The patient confirmed that she understood my advice. ? ?Patient Goals/Self Care Activities: ?-Patient/Caregiver will self-administer medications as prescribed as evidenced by self-report/primary caregiver report  ?-Patient/Caregiver will attend all scheduled provider appointments as evidenced by clinician review of documented attendance to scheduled appointments and patient/caregiver report ?-Patient/Caregiver will call pharmacy for medication refills as evidenced by patient report and review of pharmacy fill history as appropriate ?-Patient/Caregiver will call provider office for new concerns or questions as evidenced by review of documented incoming telephone call notes and patient report ?-Patient/Caregiver verbalizes understanding of plan ?-Patient/Caregiver will focus on medication adherence by taking medications as prescribed ?-Utilize Walker (assistive device) appropriately with all ambulation ?-Take her phone to the appointment to get technical help ?  ? ?[name] RN, BSN, CPC ?Care Management Coordinator ?[facility]  ?Phone: [phone]  ?  ? ? ? ? ? ? ? ? ?

## 2022-05-05 ENCOUNTER — Encounter: Payer: Self-pay | Admitting: *Deleted

## 2022-05-06 ENCOUNTER — Ambulatory Visit: Payer: Self-pay | Admitting: Licensed Clinical Social Worker

## 2022-05-06 NOTE — Chronic Care Management (AMB) (Signed)
  Care Management   Social Work Visit Note  05/06/2022 Name: Shamila Lerch MRN: [id number] DOB: 13-Jul-1991   [facility] sent back application and requested a new and updated version to be completed. SW completed on today and placed in Dr. Manson Passey box for signatures. SW will fax to [employer] once completed by provider.

## 2022-05-15 ENCOUNTER — Ambulatory Visit: Payer: Medicaid Other

## 2022-05-15 NOTE — Chronic Care Management (AMB) (Signed)
Chronic Care Management   CCM RN Visit Note  05/15/2022 Name: Natalie [name] MRN: [id number] DOB: 1991-03-02  Subjective: Natalie [name] is a 31 y.o. year old female who is a primary care patient of Westley Chandler, MD. The care management team was consulted for assistance with disease management and care coordination needs.    Engaged with patient by telephone for follow up visit in response to provider referral for case management and/or care coordination services.   Consent to Services:  The patient was given information about Chronic Care Management services, agreed to services, and gave verbal consent prior to initiation of services.  Please see initial visit note for detailed documentation.   Patient agreed to services and verbal consent obtained.    Summary:  The patient continues to maintain positive progress with care plan goals. See Care Plan below for interventions and patient self-care actives.  Recommendation: The patient may benefit from taking medications as prescribed, exercising as tolerated, and The patient agrees.  Follow up Plan: Patient would like continued follow-up.  CCM RNCM will outreach the patient within the next 4 weeks.  Patient will call office if needed prior to next encounter   Assessment: Review of patient past medical history, allergies, medications, health status, including review of consultants reports, laboratory and other test data, was performed as part of comprehensive evaluation and provision of chronic care management services.   SDOH (Social Determinants of Health) assessments and interventions performed:  No  CCM Care Plan Conditions to be addressed/monitored: Rheumatoid arthritis  Care Plan : RN Care Manager  Updates made by [name], RN since 05/15/2022 12:00 AM     Problem: Rheumatoid Arthritis      Goal: The patient will maintain and manage her symptoms by following  the doctors plan, taking her medications, and keeping her  appointments   Start Date: 08/18/2021  Expected End Date: 08/28/2022  Priority: High  Note:   Current Barriers:  Chronic pain r/t Rheumatoid Arthritis as evidenced by patient stating her pain level is at  a 8/10.    RNCM Clinical Goal(s):  Patient will verbalize understanding of plan for management of pain by describing how unrelieved pain will be managed , demonstrate the ability to pace her activities and keep all appointments  through collaboration with RN Care manager, provider, and care team.   Interventions: 1:1 collaboration with primary care provider regarding development and update of comprehensive plan of care as evidenced by provider attestation and co-signature Inter-disciplinary care team collaboration (see longitudinal plan of care) Evaluation of current treatment plan related to  self management and patient's adherence to plan as established by provider   Pain:  (Status: Goal on Track (progressing): YES.) Long Term                                         Discussed importance of adherence to all scheduled medical appointments; Counseled on the importance of reporting any/all new or changed pain symptoms or management strategies to pain management provider; 05/15/22: Natalie [name]'s RA is worsening, but she's taking Enbrel and prednisone as needed, along with other medications, and doing stretches. Walking with her walker makes her legs feel stronger and less stiff. She has a follow-up appointment with Dr. Eddie North in September.   Patient Goals/Self Care Activities: -Patient/Caregiver will self-administer medications as prescribed as evidenced by self-report/primary caregiver report  -Patient/Caregiver will attend all scheduled  provider appointments as evidenced by clinician review of documented attendance to scheduled appointments and patient/caregiver report -Patient/Caregiver will call pharmacy for medication refills as evidenced by patient report and review of pharmacy fill  history as appropriate -Patient/Caregiver will call provider office for new concerns or questions as evidenced by review of documented incoming telephone call notes and patient report -Patient/Caregiver verbalizes understanding of plan -Patient/Caregiver will focus on medication adherence by taking medications as prescribed -[name] (assistive device) appropriately with all ambulation     [name] RN, BSN, [facility] Care Management Coordinator [facility]  Phone: [phone]

## 2022-05-15 NOTE — Patient Instructions (Signed)
Visit Information  Natalie [name]  it was nice speaking with you. Please call me directly [phone] if you have questions about the goals we discussed.    Patient Goals/Self Care Activities: -Patient/Caregiver will self-administer medications as prescribed as evidenced by self-report/primary caregiver report  -Patient/Caregiver will attend all scheduled provider appointments as evidenced by clinician review of documented attendance to scheduled appointments and patient/caregiver report -Patient/Caregiver will call pharmacy for medication refills as evidenced by patient report and review of pharmacy fill history as appropriate -Patient/Caregiver will call provider office for new concerns or questions as evidenced by review of documented incoming telephone call notes and patient report -Patient/Caregiver verbalizes understanding of plan -Patient/Caregiver will focus on medication adherence by taking medications as prescribed -Utilize Walker (assistive device) appropriately with all ambulation     Patient verbalizes understanding of instructions and care plan provided today and agrees to view in MyChart. Active MyChart status and patient understanding of how to access instructions and care plan via MyChart confirmed with patient.     Follow up Plan: Patient would like continued follow-up.  CCM RNCM will outreach the patient within the next 4 weeks.  Patient will call office if needed prior to next encounter  [name], RN  [phone]

## 2022-06-16 ENCOUNTER — Ambulatory Visit: Payer: Medicaid Other

## 2022-06-16 NOTE — Chronic Care Management (AMB) (Signed)
RNCM Care Management Note 06/16/2022 Name: Natalie [name] MRN: [id number] DOB: 06-Aug-1991   Natalie [name] is a 31 y.o. year old female who sees Martyn Malay, MD for primary care. RNCM was consulted  to assistance patient with  Care Coordination.      Engaged with patient by telephone for follow up visit in response to provider referral for Care Coordination.     Summary: Today, I had a conversation with [place]. She expressed that she is still struggling with her RA and experiencing pain and soreness. . See Care Plan below for interventions and patient self-care actives.  Recommendation: The patient may benefit from taking medications as prescribed, exercising as tolerated, and The patient agrees.  Follow up Plan: Patient would like continued follow-up.  CM RNCM will outreach the patient within the next 4 weeks.  Patient will call office if needed prior to next encounter   SDOH (Social Determinants of Health) screening and interventions performed today: no    RN Plan of Care for Conditions/Un Met Needs Addressed and Monitored  Patient Care Plan: RN Care Manager     Problem Identified: Rheumatoid Arthritis Resolved 06/16/2022     Goal: The patient will maintain and manage her symptoms by following  the doctors plan, taking her medications, and keeping her appointments Completed 06/16/2022  Start Date: 08/18/2021  Expected End Date: 08/28/2022  Priority: High  Note:   Current Barriers:  Chronic pain r/t Rheumatoid Arthritis as evidenced by patient stating her pain level is at  a 8/10.    RNCM Clinical Goal(s):  Patient will verbalize understanding of plan for management of pain by describing how unrelieved pain will be managed , demonstrate the ability to pace her activities and keep all appointments  through collaboration with RN Care manager, provider, and care team.   Interventions: 1:1 collaboration with primary care provider regarding development and update of  comprehensive plan of care as evidenced by provider attestation and co-signature Inter-disciplinary care team collaboration (see longitudinal plan of care) Evaluation of current treatment plan related to  self management and patient's adherence to plan as established by provider   Pain:  (Status: Goal on Track (progressing): YES.) Long Term                                         Discussed importance of adherence to all scheduled medical appointments; Counseled on the importance of reporting any/all new or changed pain symptoms or management strategies to pain management provider; 06/16/22: Today, I had a conversation with [place]. She expressed that she is still struggling with her RA and experiencing pain and soreness. According to her, the rheumatology office suggested taking two prednisone pills in the morning and two in the night. Additionally, she is exercising regularly to avoid stiffness and using a rollator to prevent falls. I recommended that she follow her physician's prescribed medication regimen.  Patient Goals/Self Care Activities: -Patient/Caregiver will self-administer medications as prescribed as evidenced by self-report/primary caregiver report  -Patient/Caregiver will attend all scheduled provider appointments as evidenced by clinician review of documented attendance to scheduled appointments and patient/caregiver report -Patient/Caregiver will call pharmacy for medication refills as evidenced by patient report and review of pharmacy fill history as appropriate -Patient/Caregiver will call provider office for new concerns or questions as evidenced by review of documented incoming telephone call notes and patient report -Patient/Caregiver verbalizes understanding of plan -Patient/Caregiver  will focus on medication adherence by taking medications as prescribed -Utilize Walker (assistive device) appropriately with all ambulation  Resolving due to duplicate goal       Lazaro Arms RN, BSN, [facility]  Phone: [phone]

## 2022-06-16 NOTE — Patient Instructions (Signed)
Visit Information  Natalie [name]  it was nice speaking with you. Please call me directly [phone] if you have questions about the goals we discussed.  Patient Goals/Self Care Activities: -Patient/Caregiver will self-administer medications as prescribed as evidenced by self-report/primary caregiver report  -Patient/Caregiver will attend all scheduled provider appointments as evidenced by clinician review of documented attendance to scheduled appointments and patient/caregiver report -Patient/Caregiver will call pharmacy for medication refills as evidenced by patient report and review of pharmacy fill history as appropriate -Patient/Caregiver will call provider office for new concerns or questions as evidenced by review of documented incoming telephone call notes and patient report -Patient/Caregiver verbalizes understanding of plan -Patient/Caregiver will focus on medication adherence by taking medications as prescribed -Utilize Walker (assistive device) appropriately with all ambulation   Patient verbalizes understanding of instructions and care plan provided today and agrees to view in MyChart. Active MyChart status and patient understanding of how to access instructions and care plan via MyChart confirmed with patient.     Follow up Plan: Patient would like continued follow-up.  CM RNCM will outreach the patient within the next 4 weeks.  Patient will call office if needed prior to next encounter  [name], RN  [phone]

## 2022-06-22 ENCOUNTER — Ambulatory Visit (INDEPENDENT_AMBULATORY_CARE_PROVIDER_SITE_OTHER): Payer: Medicaid Other | Admitting: Family Medicine

## 2022-06-22 ENCOUNTER — Encounter: Payer: Self-pay | Admitting: Family Medicine

## 2022-06-22 DIAGNOSIS — M059 Rheumatoid arthritis with rheumatoid factor, unspecified: Secondary | ICD-10-CM | POA: Diagnosis not present

## 2022-06-22 MED ORDER — NORGESTIMATE-ETH ESTRADIOL 0.25-35 MG-MCG PO TABS: 1.0000 | ORAL_TABLET | Freq: Every day | ORAL | 3 refills | Status: DC

## 2022-06-22 NOTE — Assessment & Plan Note (Signed)
Recommended reaching out to Rheumatology about tapering down on prednisone (recently gave her burst)

## 2022-06-22 NOTE — Assessment & Plan Note (Signed)
Encouraged physical activity A1C at follow up She will ask her Psychiatrist about switching Zyprexa out

## 2022-06-22 NOTE — Progress Notes (Signed)
[facility] Telemedicine Visit  Patient consented to have virtual visit and was identified by name and date of birth. Method of visit: Telephone  Encounter participants: Patient: Natalie [name] - located at home Provider: Westley Chandler - located at office Others (if applicable): husband--present for part of the conversation, they are both at home   Chief Complaint: birth control  HPI: Natalie [name] is a pleasant 31 yo F with history of bipolar disorder on lithium, RA on Enbrel, and obesity presenting for discussion of contraception. Last Depo was 03/27/2022. She reports she wants to switch from the Depo. She reports she has had irregular menses and a 40 pound weight gain on the medication. Denies other side effects. She is interested in the pill. O  Weight gain Patient reports being less active due to RA. In addition to Depo, she is on prednisone, lithium, and Zyprexa.   RA Reports her RA is flaring. She is taking prednisone. She reports compliance with Enbrel. Rheumatologist thinking of starting methotrexate.   ROS: per HPI  Pertinent PMHx: Bipolar disorder, Rheumatoid arthritis   Exam:  Speaking in full sentences  Talkative  Assessment/Plan:  Rheumatoid arthritis (HCC) Recommended reaching out to Rheumatology about tapering down on prednisone (recently gave her burst)   Morbid obesity (HCC) Encouraged physical activity A1C at follow up She will ask her Psychiatrist about switching Zyprexa out    Contraception counseling - No history of HTN, VTE, or migraine with aura - Reviewed side effects, including rare event of VTE - Rx Sprintec - Spent 5 minutes explaining pill pack and how to take   Time spent during visit with patient: 11 minutes

## 2022-06-25 ENCOUNTER — Other Ambulatory Visit: Payer: Self-pay

## 2022-06-29 ENCOUNTER — Encounter: Payer: Medicaid Other | Admitting: Family Medicine

## 2022-06-30 ENCOUNTER — Telehealth: Payer: Self-pay

## 2022-06-30 NOTE — Telephone Encounter (Signed)
Patient calls nurse line requesting to speak with Dr. Manson Passey regarding issues with mobility. Patient states that she uses rollator, however, it is still painful for her to walk and she walks with a limp.   Patient states that specialist is aware of this and she has an upcoming appointment in September.   Patient also has follow up visit on 8/14. She has concerns for how she will make it into the office due to issues with mobility.   Patient is requesting to speak with provider regarding this concern.   Please call patient at either [phone] or on husband's phone at [phone].  Veronda Prude, RN

## 2022-07-01 NOTE — Telephone Encounter (Signed)
Called patient to discuss. She is getting labs done. If cannot make appointment, she will switch to virtual visit.  Natalie Starr, MD  Family Medicine Teaching Service

## 2022-07-13 ENCOUNTER — Encounter: Payer: Self-pay | Admitting: Family Medicine

## 2022-07-13 ENCOUNTER — Telehealth (INDEPENDENT_AMBULATORY_CARE_PROVIDER_SITE_OTHER): Payer: Medicaid Other | Admitting: Family Medicine

## 2022-07-13 DIAGNOSIS — L853 Xerosis cutis: Secondary | ICD-10-CM | POA: Diagnosis not present

## 2022-07-13 DIAGNOSIS — F3181 Bipolar II disorder: Secondary | ICD-10-CM | POA: Diagnosis not present

## 2022-07-13 DIAGNOSIS — M059 Rheumatoid arthritis with rheumatoid factor, unspecified: Secondary | ICD-10-CM | POA: Diagnosis not present

## 2022-07-13 NOTE — Progress Notes (Signed)
[facility] Telemedicine Visit  Patient consented to have virtual visit and was identified by name and date of birth. Method of visit: Video was attempted but was interrupted: <50% of visit completed via video  Encounter participants: Patient: Natalie [name] - located at home Provider: Westley Chandler - located at office Others (if applicable): home  Chief Complaint: pain in legs   HPI: Natalie [name] is a 31 yo with history of bipolar disorder and rheumatoid arthritis presenting for follow up.   Rheumatoid arthritis Walking has gotten more challenging. Using walker. Called [facility] and was told to use prednisone. She does not wish to take extra doses to weight gain but started a prednisone burst at 20  mg per day. She has noted weight gain but improved symptoms. She is planning to stay on 20 mg daily until September. Thinks she has gained another 30 pounds.    In terms of contraceptive use, patient reports compliance. No issues. LMP mid-July.   Patient reports increased urinary frequency and polydipsia. Thinks this is due to her lithium. Has had weight gain with steroids. Has access to glucometer at home.   ROS: per HPI  Pertinent PMHx: RA on Enbrel   Exam:  Speaking in full sentences. Appropriate, focused.   Assessment/Plan:  Xerosis of skin Improved per patient, using lotion   Rheumatoid arthritis (HCC) Given duration of steroids (will be on almost 8 weeks by time of Dr. Herma Carson appointment) asked her to call Rheumatology to request taper schedule  She has had significant weight gain already on steroids and would benefit from DMARD therapy rather than steroids    Bipolar 2 disorder (HCC) Stable medications updated    Polydipsia and polyuria - Concern for diabetes given weight gain and steroids - Patient to check BG tomorrow morning  - Nephrogenic DI also on differential--if BG normal, will ask her to come in for labs and UA   Time spent during visit  with patient: 8 minutes

## 2022-07-13 NOTE — Assessment & Plan Note (Deleted)
Given duration of steroids (will be on almost 8 weeks by time of Dr. Herma Carson appointment) asked her to call Rheumatology to request taper schedule  She has had significant weight gain already on steroids and would benefit from DMARD therapy rather than steroids

## 2022-07-13 NOTE — Assessment & Plan Note (Signed)
Given duration of steroids (will be on almost 8 weeks by time of Dr. Z appointment) asked her to call Rheumatology to request taper schedule  She has had significant weight gain already on steroids and would benefit from DMARD therapy rather than steroids  

## 2022-07-13 NOTE — Assessment & Plan Note (Signed)
Stable medications updated

## 2022-07-13 NOTE — Assessment & Plan Note (Signed)
Improved per patient, using lotion

## 2022-07-14 ENCOUNTER — Other Ambulatory Visit: Payer: Self-pay

## 2022-07-17 ENCOUNTER — Ambulatory Visit: Payer: Self-pay

## 2022-07-18 NOTE — Patient Outreach (Signed)
  Care Coordination   Follow Up Visit Note   07/18/2022 Name: Bethan Adamek MRN: [id number] DOB: 22-Aug-1991  Megann Easterwood is a 31 y.o. year old female who sees Westley Chandler, MD for primary care. I spoke with  [name] by phone today  What matters to the patients health and wellness today?  I have pain in my joints    Goals Addressed               This Visit's Progress     I am learning how to deal with my RA (pt-stated)        Care Coordination Interventions: Reviewed medications with patient and discussed  Winry Egnew reported increased pain while taking Enbrel, and the prednisone helps. Her physician plans to discontinue prednisone due to side effects. Blood work will be done on 9/14 with her Rheumatologist to identify alternative medication. I advised her to continue her stretches, use the one lb. hand weights she recently purchased,  fall precautions, use her rollator when walking, take her medications as prescribed, and monitor her diet. Active listening / Reflection utilized  Emotional Support Provided Problem Solving /Task Center strategies reviewed         SDOH assessments and interventions completed:  No     Care Coordination Interventions Activated:  Yes  Care Coordination Interventions:  Yes, provided   Follow up plan: Follow up call scheduled for 9/20 at 1 pm    Encounter Outcome:  Pt. Scheduled   [name] RN, BSN, [facility] Care Coordinator [facility]   Phone: [phone]

## 2022-07-18 NOTE — Patient Instructions (Signed)
Visit Information  Thank you for taking time to visit with me today. Please don't hesitate to contact me if I can be of assistance to you.   Following are the goals we discussed today:   Goals Addressed               This Visit's Progress     I am learning how to deal with my RA (pt-stated)        Care Coordination Interventions: Reviewed medications with patient and discussed  Joel Mericle reported increased pain while taking Enbrel, and the prednisone helps. Her physician plans to discontinue prednisone due to side effects. Blood work will be done on 9/14 with her Rheumatologist to identify alternative medication. I advised her to continue her stretches, use the one lb. hand weights she recently purchased,  fall precautions, use her rollator when walking, take her medications as prescribed, and monitor her diet. Active listening / Reflection utilized  Emotional Support Provided Problem Solving /Task Center strategies reviewed         Our next appointment is by telephone on 9/20 at 1 pm  Please call the care guide team at [phone] if you need to cancel or reschedule your appointment.    Patient verbalizes understanding of instructions and care plan provided today and agrees to view in MyChart. Active MyChart status and patient understanding of how to access instructions and care plan via MyChart confirmed with patient.     [name] RN, BSN, [facility] Care Coordinator [facility]   Phone: [phone]

## 2022-07-27 ENCOUNTER — Telehealth: Payer: Self-pay

## 2022-07-27 NOTE — Telephone Encounter (Signed)
Patient calls nurse line requesting to speak with PCP.   Patient reports bilateral swollen feet and ankles, see mychart message for photos. Patient reports swelling has been "off and on" since starting birth control pills. Patients reports taking them consistently since prescribed.   Patient denies any pain, SOB or chest pains at this time.   Apt offered, however patient stated, "NO I cant come in there."   Patient advised PCP is on out of the office, however will route to covering provider.   Patient advised to elevate legs.   Red flags discussed and ED precautions given.

## 2022-08-08 IMAGING — CR DG CHEST 2V
2 series · 2 of 2 positions shown · non-contrast
Comparison: None.

CLINICAL DATA: Immunosuppressive therapy

EXAM:
CHEST - 2 VIEW

[chest pa]
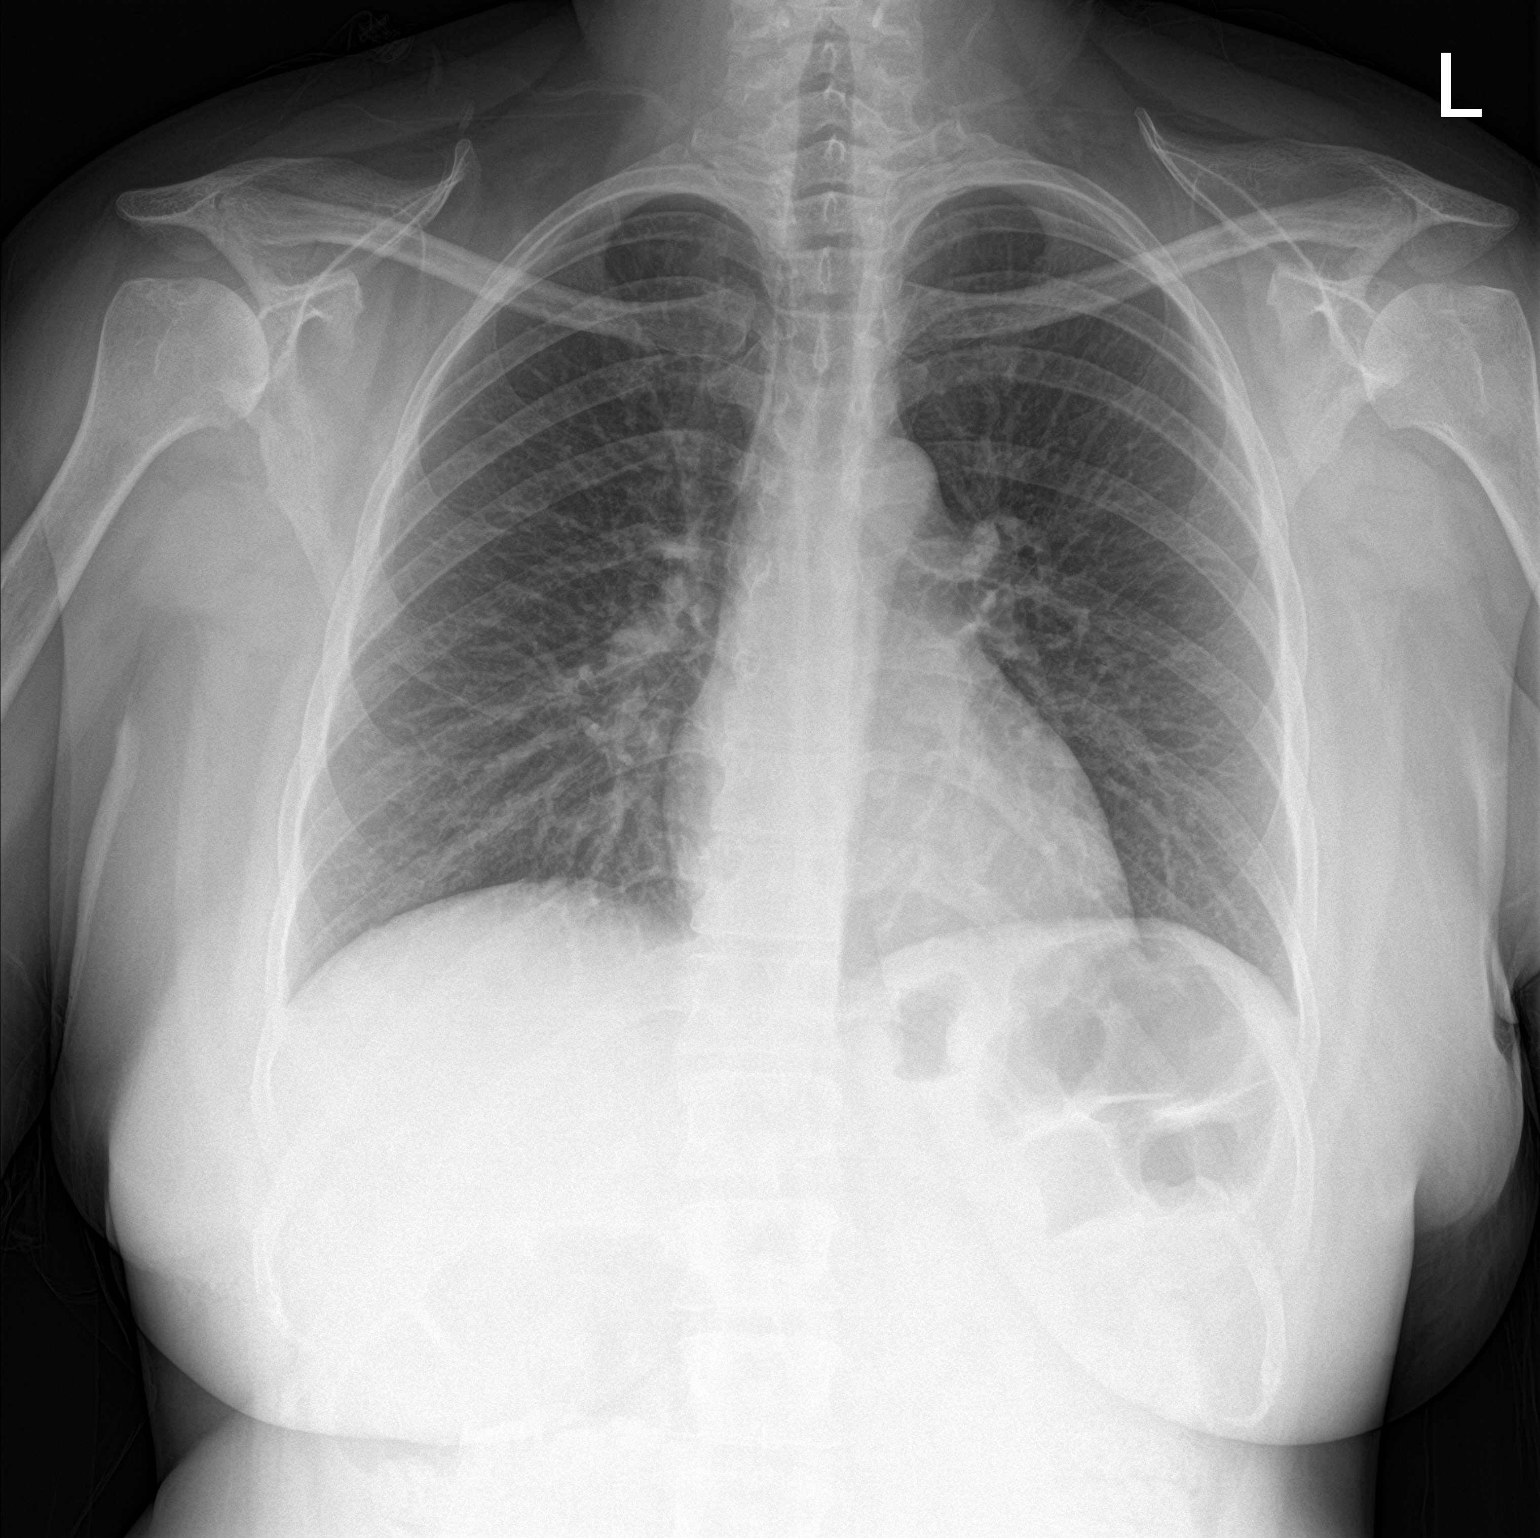

[chest lat]
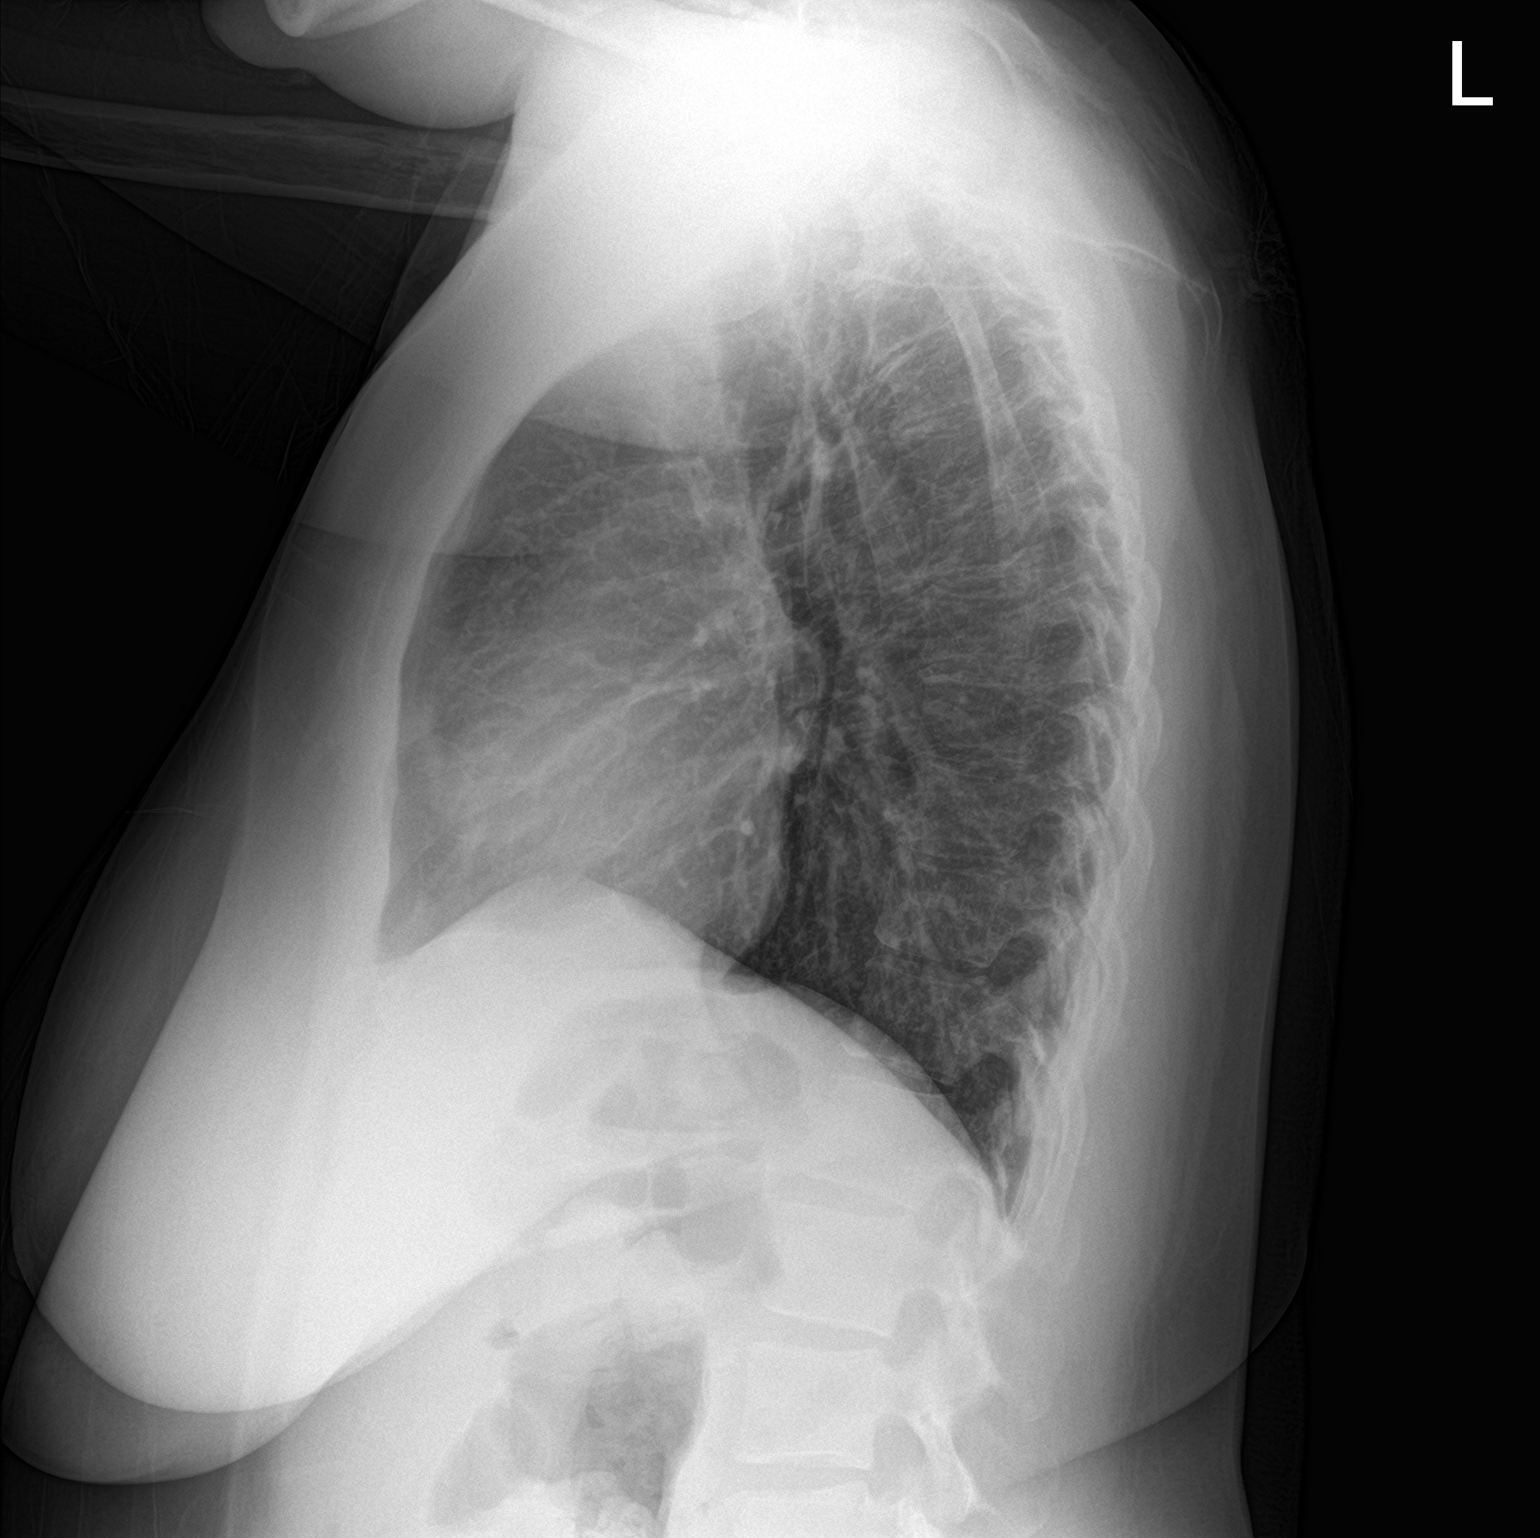

[2 of 2 positions shown; findings below may reference images not displayed]

FINDINGS: The heart size and mediastinal contours are within normal limits. No
focal airspace consolidation, pleural effusion, or pneumothorax. The
visualized skeletal structures are unremarkable.
IMPRESSION: No active cardiopulmonary disease.

## 2022-08-19 ENCOUNTER — Ambulatory Visit: Payer: Self-pay

## 2022-08-19 NOTE — Patient Instructions (Signed)
Visit Information  Thank you for taking time to visit with me today. Please don't hesitate to contact me if I can be of assistance to you.   Following are the goals we discussed today:   Goals Addressed               This Visit's Progress     I am learning how to deal with my RA (pt-stated)        Care Coordination Interventions: Reviewed medications with patient and discussed  I had a conversation with [name], and she informed me that she visited [name] on August 13, 2022, and underwent blood work. She is in the process of reducing her prednisone dosage and starting to take Hydroxychloroquine (Plaquenil) 200 mg twice a day. Tykera also mentioned that she experienced swelling in her lower extremities bilaterally, which she attributed to her birth control. Consequently, she discontinued taking it. I advised her at the physical appointment with Dr. Owens Shark on September 04, 2022, to discuss the medication with her. Active listening / Reflection utilized  Emotional Support Provided Problem [place] strategies reviewed         Our next appointment is by telephone on 10/20  at 130 pm  Please call the care guide team at [phone] if you need to cancel or reschedule your appointment.   If you are experiencing a Mental Health or [place] or need someone to talk to, please call 1-800-273-TALK (toll free, 24 hour hotline)  Patient verbalizes understanding of instructions and care plan provided today and agrees to view in [place]. Active MyChart status and patient understanding of how to access instructions and care plan via MyChart confirmed with patient.     Lazaro Arms RN, BSN, [facility]   Phone: [phone]

## 2022-08-19 NOTE — Patient Outreach (Signed)
  Care Coordination   Follow Up Visit Note   08/19/2022 Name: Natalie [name] MRN: [id number] DOB: 19-Oct-1991  Natalie [name] is a 31 y.o. year old female who sees Natalie Malay, MD for primary care. I spoke with  Natalie [name] by phone today.  What matters to the patients health and wellness today?  I am weaning off prednisone.    Goals Addressed               This Visit's Progress     I am learning how to deal with my RA (pt-stated)        Care Coordination Interventions: Reviewed medications with patient and discussed  I had a conversation with Natalie [name], and she informed me that she visited Doctor Natalie [name] on August 13, 2022, and underwent blood work. She is in the process of reducing her prednisone dosage and starting to take Natalie [name] (Plaquenil) 200 mg twice a day. Natalie [name] also mentioned that she experienced swelling in her lower extremities bilaterally, which she attributed to her birth control. Consequently, she discontinued taking it. I advised her at the physical appointment with Dr. Owens [name] on September 04, 2022, to discuss the medication with her. Active listening / Reflection utilized  Emotional Support Provided Problem Natalie [name] [place] strategies reviewed         SDOH assessments and interventions completed:  No     Care Coordination Interventions Activated:  Yes  Care Coordination Interventions:  Yes, provided   Follow up plan: Follow up call scheduled for 10/20 130 pm    Encounter Outcome:  Pt. Visit Completed   Natalie Arms RN, BSN, [facility]   Phone: [phone]

## 2022-09-04 ENCOUNTER — Encounter: Payer: Medicaid Other | Admitting: Family Medicine

## 2022-09-16 ENCOUNTER — Ambulatory Visit: Payer: Self-pay

## 2022-09-16 NOTE — Patient Instructions (Signed)
Visit Information  Thank you for taking time to visit with me today. Please don't hesitate to contact me if I can be of assistance to you.   Following are the goals we discussed today:   Goals Addressed               This Visit's Progress     I am learning how to deal with my RA (pt-stated)        Care Coordination Interventions: Reviewed medications with patient and discussed  Active listening / Reflection utilized  Emotional Support Provided Problem [place] has informed that she has stopped taking prednisone and is currently only taking Paxil along with the Enbrel. This medication has caused a decrease in her appetite, leading to some weight loss. She understands the need to drink more water as she has been consuming sodas. Although she feels good, she still experiences stiffness and pain on some days, for which she is using her walker and stretching. I advised her to continue taking all her medications, stretching regularly, using her walker, and taking necessary precautions while going out.        Our next appointment is by telephone on 11/21 at 9 am  Please call the care guide team at [phone] if you need to cancel or reschedule your appointment.   If you are experiencing a Mental Health or [place] or need someone to talk to, please call 1-800-273-TALK (toll free, 24 hour hotline)  Patient verbalizes understanding of instructions and care plan provided today and agrees to view in [place]. Active MyChart status and patient understanding of how to access instructions and care plan via MyChart confirmed with patient.     Lazaro Arms RN, BSN, [facility]   Phone: [phone]

## 2022-09-16 NOTE — Patient Outreach (Signed)
  Care Coordination   Follow Up Visit Note   09/16/2022 Name: Castella Lerner MRN: [id number] DOB: 11/13/1991  Lavette Yankovich is a 31 y.o. year old female who sees Martyn Malay, MD for primary care. I spoke with  [name] by phone today.  What matters to the patients health and wellness today?  I feel better.    Goals Addressed               This Visit's Progress     I am learning how to deal with my RA (pt-stated)        Care Coordination Interventions: Reviewed medications with patient and discussed  Active listening / Reflection utilized  Emotional Support Provided Problem [place] Corlis Leak has informed that she has stopped taking prednisone and is currently only taking Paxil along with the Enbrel. This medication has caused a decrease in her appetite, leading to some weight loss. She understands the need to drink more water as she has been consuming sodas. Although she feels good, she still experiences stiffness and pain on some days, for which she is using her walker and stretching. I advised her to continue taking all her medications, stretching regularly, using her walker, and taking necessary precautions while going out.        SDOH assessments and interventions completed:  No     Care Coordination Interventions Activated:  Yes  Care Coordination Interventions:  Yes, provided   Follow up plan: Follow up call scheduled for 11/21 9 am    Encounter Outcome:  Pt. Visit Completed   Lazaro Arms RN, BSN, [place] [facility]   Phone: [phone]

## 2022-10-20 ENCOUNTER — Ambulatory Visit: Payer: Self-pay

## 2022-10-20 NOTE — Patient Instructions (Signed)
Visit Information  Thank you for taking time to visit with me today. Please don't hesitate to contact me if I can be of assistance to you.   Following are the goals we discussed today:   Goals Addressed               This Visit's Progress     I am learning how to deal with my RA (pt-stated)        Care Coordination Interventions: Reviewed medications with patient and discussed  Active listening / Reflection utilized  Emotional Support Provided Problem Solving /Task Center strategies reviewed Counseled on the importance of reporting any/all new or changed pain symptoms or management strategies to pain management provider Advised patient to report to care team affect of pain on daily activities Discussed use of relaxation techniques and/or diversional activities to assist with pain reduction (distraction, imagery, relaxation, massage, acupressure, TENS, heat, and cold application         Our next appointment is by telephone on 12/21 at 9 am  Please call the care guide team at [phone] if you need to cancel or reschedule your appointment.   If you are experiencing a Mental Health or Behavioral Health Crisis or need someone to talk to, please call 1-800-273-TALK (toll free, 24 hour hotline)  Patient verbalizes understanding of instructions and care plan provided today and agrees to view in MyChart. Active MyChart status and patient understanding of how to access instructions and care plan via MyChart confirmed with patient.     [name] RN, BSN, [facility] Care Coordinator [facility]   Phone: [phone]

## 2022-10-20 NOTE — Patient Outreach (Signed)
  Care Coordination   Follow Up Visit Note   10/20/2022 Name: Jasmain Ahlberg MRN: [id number] DOB: 08-May-1991  Lennyx Verdell is a 31 y.o. year old female who sees Westley Chandler, MD for primary care. I spoke with  [name] by phone today.  What matters to the patients health and wellness today?  Sharlisa reported feeling fair, but mentioned that she wakes up with some pain in the morning. She understands that it will take some time for her Plaquenil 200 mg (two pills daily) to take effect. She is still taking Enbrel 50 mg 1 ml every seven days along with her other medications. I advised her to continue with her routine of stretching and walking with her walker. Additionally, I suggested that she use heat to help soothe her pain.     Goals Addressed               This Visit's Progress     I am learning how to deal with my RA (pt-stated)        Care Coordination Interventions: Reviewed medications with patient and discussed  Active listening / Reflection utilized  Emotional Support Provided Problem Solving /Task Center strategies reviewed Counseled on the importance of reporting any/all new or changed pain symptoms or management strategies to pain management provider Advised patient to report to care team affect of pain on daily activities Discussed use of relaxation techniques and/or diversional activities to assist with pain reduction (distraction, imagery, relaxation, massage, acupressure, TENS, heat, and cold application         SDOH assessments and interventions completed:  No     Care Coordination Interventions Activated:  Yes  Care Coordination Interventions:  Yes, provided   Follow up plan: Follow up call scheduled for 12/21 9 am    Encounter Outcome:  Pt. Visit Completed   [name] RN, BSN, [facility] Care Coordinator [facility]   Phone: [phone]

## 2022-11-19 ENCOUNTER — Ambulatory Visit: Payer: Self-pay

## 2022-11-19 NOTE — Patient Instructions (Signed)
Visit Information  Thank you for taking time to visit with me today. Please don't hesitate to contact me if I can be of assistance to you.   Following are the goals we discussed today:   Goals Addressed               This Visit's Progress     I am learning how to deal with my RA (pt-stated)        Care Coordination Interventions: Reviewed medications with patient and discussed  Active listening / Reflection utilized  Emotional Support Provided Problem Solving /Task Center strategies reviewed Counseled on the importance of reporting any/all new or changed pain symptoms or management strategies to pain management provider Advised patient to report to care team affect of pain on daily activities Discussed use of relaxation techniques and/or diversional activities to assist with pain reduction (distraction, imagery, relaxation, massage, acupressure, TENS, heat, and cold application Rayden has been experiencing pain, but she is managing it by taking her medication and doing stretching exercises. She is considering speaking with her physician about the possibility of physical therapy, but she plans to do that after the holidays. I advised her to continue with her current routine. To stay warm, she uses her rollator when going out and her cane at home for stability. We discussed fall protocols, and she confirmed that she understood them.        9 am  Please call the care guide team at [phone] if you need to cancel or reschedule your appointment.   If you are experiencing a Mental Health or Behavioral Health Crisis or need someone to talk to, please call 1-800-273-TALK (toll free, 24 hour hotline)  Patient verbalizes understanding of instructions and care plan provided today and agrees to view in MyChart. Active MyChart status and patient understanding of how to access instructions and care plan via MyChart confirmed with patient.     [name] RN, BSN, [facility] Care  Coordinator [facility]   Phone: [phone]

## 2022-11-19 NOTE — Patient Outreach (Signed)
  Care Coordination   Follow Up Visit Note   11/19/2022 Name: Natalie [name] MRN: [id number] DOB: 12-Oct-1991  Natalie [name] is a 31 y.o. year old female who sees Westley Chandler, MD for primary care. I spoke with  Natalie [name] by phone today.  What matters to the patients health and wellness today?  I have been experiencing some pain    Goals Addressed               This Visit's Progress     I am learning how to deal with my RA (pt-stated)        Care Coordination Interventions: Reviewed medications with patient and discussed  Active listening / Reflection utilized  Emotional Support Provided Problem Solving /Task Center strategies reviewed Counseled on the importance of reporting any/all new or changed pain symptoms or management strategies to pain management provider Advised patient to report to care team affect of pain on daily activities Discussed use of relaxation techniques and/or diversional activities to assist with pain reduction (distraction, imagery, relaxation, massage, acupressure, TENS, heat, and cold application Natalie [name] has been experiencing pain, but she is managing it by taking her medication and doing stretching exercises. She is considering speaking with her physician about the possibility of physical therapy, but she plans to do that after the holidays. I advised her to continue with her current routine. To stay warm, she uses her rollator when going out and her cane at home for stability. We discussed fall protocols, and she confirmed that she understood them.         SDOH assessments and interventions completed:  No     Care Coordination Interventions:  Yes, provided   Follow up plan: Follow up call scheduled for 12/23/22 9 am    Encounter Outcome:  Pt. Visit Completed   [name] RN, BSN, [facility] Care Coordinator [facility]   Phone: [phone]

## 2022-12-23 ENCOUNTER — Ambulatory Visit: Payer: Self-pay

## 2022-12-23 NOTE — Patient Instructions (Signed)
Visit Information  Thank you for taking time to visit with me today. Please don't hesitate to contact me if I can be of assistance to you.   Following are the goals we discussed today:   Goals Addressed               This Visit's Progress     I am learning how to deal with my RA (pt-stated)        Care Coordination Interventions: Reviewed medications with patient and discussed  Active listening / Reflection utilized  Emotional Support Provided Problem [place] strategies reviewed Counseled on the importance of reporting any/all new or changed pain symptoms or management strategies to pain management provider Advised patient to report to care team affect of pain on daily activities Discussed use of relaxation techniques and/or diversional activities to assist with pain reduction (distraction, imagery, relaxation, massage, acupressure, TENS, heat, and cold application The patient stated "I am feeling much better now. Although I still need to use a cane and rollator, I can manage better and even do stretches. I don't experience any pain, but I do get sore sometimes. The medication has been absorbed into my system, and I have not given up on myself. I keep a close eye on my diet and take my pills daily. I reassured the patient that I am proud of her and advised her to continue with her regimen."        Our next appointment is by telephone on 01/20/23 at 9 am  Please call the care guide team at [phone] if you need to cancel or reschedule your appointment.   If you are experiencing a Mental Health or [place] or need someone to talk to, please call 1-800-273-TALK (toll free, 24 hour hotline)  Patient verbalizes understanding of instructions and care plan provided today and agrees to view in [place]. Active MyChart status and patient understanding of how to access instructions and care plan via MyChart confirmed with patient.     Lazaro Arms RN, BSN,  [place] [facility]   Phone: [phone]

## 2022-12-23 NOTE — Patient Outreach (Signed)
  Care Coordination   Follow Up Visit Note   12/23/2022 Name: Natalie [name] MRN: [id number] DOB: [birth year]  Natalie [name] is a 32 y.o. year old female who sees Natalie Malay, MD for primary care. I spoke with  Natalie [name] by phone today.  What matters to the patients health and wellness today?  I am feeling better    Goals Addressed               This Visit's Progress     I am learning how to deal with my RA (pt-stated)        Care Coordination Interventions: Reviewed medications with patient and discussed  Active listening / Reflection utilized  Emotional Support Provided Problem [place] on the importance of reporting any/all new or changed pain symptoms or management strategies to pain management provider Advised patient to report to care team affect of pain on daily activities Discussed use of relaxation techniques and/or diversional activities to assist with pain reduction (distraction, imagery, relaxation, massage, acupressure, TENS, heat, and cold application The patient stated "I am feeling much better now. Although I still need to use a cane and rollator, I can manage better and even do stretches. I don't experience any pain, but I do get sore sometimes. The medication has been absorbed into my system, and I have not given up on myself. I keep a close eye on my diet and take my pills daily. I reassured the patient that I am proud of her and advised her to continue with her regimen."        SDOH assessments and interventions completed:  No     Care Coordination Interventions:  Yes, provided   Follow up plan: Follow up call scheduled for 01/20/23 9 am    Encounter Outcome:  Pt. Visit Completed   Natalie Arms RN, BSN, [place] [facility]   Phone: [phone]

## 2023-01-20 ENCOUNTER — Ambulatory Visit: Payer: Self-pay

## 2023-01-20 NOTE — Patient Outreach (Signed)
  Care Coordination   Follow Up Visit Note   01/20/2023 Name: Natalie [name] MRN: [id number] DOB: 26-Jun-1991  Natalie [name] is a 32 y.o. year old female who sees Martyn Malay, MD for primary care. I spoke with  [name] by phone today.  What matters to the patients health and wellness today?  Natalie [name] is improving her walking by doing her stretches regularly. Her medications seem to be working well and she experiences no pain, just mild soreness at times. She is maintaining a healthy diet and sleep routine, and also taking 51m of prednisone to manage pain. I praised her for her progress and advised her to continue taking her medications as prescribed, doing her stretches, monitoring her food intake, and using her walker or cane when necessary.    Goals Addressed               This Visit's Progress     I am learning how to deal with my RA (pt-stated)        Patient Goals/Self Care Activities: -Patient/Caregiver will self-administer medications as prescribed as evidenced by self-report/primary caregiver report  -Patient/Caregiver will attend all scheduled provider appointments as evidenced by clinician review of documented attendance to scheduled appointments and patient/caregiver report -Patient/Caregiver will call pharmacy for medication refills as evidenced by patient report and review of pharmacy fill history as appropriate -Patient/Caregiver will call provider office for new concerns or questions as evidenced by review of documented incoming telephone call notes and patient report -Patient/Caregiver verbalizes understanding of plan -Patient/Caregiver will focus on medication adherence by taking medications as prescribed  -Utilize cane and walker (assistive device) appropriately with ambulation  patient will demonstrate use of different relaxation  skills and/or diversional activities to assist with pain reduction (distraction, imagery, relaxation, massage, acupressure,  TENS, heat, and cold application., patient will use pharmacological and nonpharmacological pain relief strategies as prescribed. , and patient will verbalize acceptable level of pain relief and ability to engage in desired activities          SDOH assessments and interventions completed:  No     Care Coordination Interventions:  Yes, provided    Interventions Today    Flowsheet Row Most Recent Value  Chronic Disease   Chronic disease during today's visit Other  [Rhuematoid Arthritis]  General Interventions   General Interventions Discussed/Reviewed General Interventions Discussed, General Interventions Reviewed  Exercise Interventions   Exercise Discussed/Reviewed Exercise Discussed, Exercise Reviewed  [Stretches]  Nutrition Interventions   Nutrition Discussed/Reviewed Nutrition Discussed  Safety Interventions   Safety Discussed/Reviewed Safety Discussed, Fall Risk       Follow up plan: Follow up call scheduled for 02/16/23 9 am    Encounter Outcome:  Pt. Visit Completed   TLazaro ArmsRN, BSN, [facility]   Phone: [phone]

## 2023-01-20 NOTE — Patient Instructions (Signed)
Visit Information  Thank you for taking time to visit with me today. Please don't hesitate to contact me if I can be of assistance to you.   Following are the goals we discussed today:   Goals Addressed               This Visit's Progress     I am learning how to deal with my RA (pt-stated)        Patient Goals/Self Care Activities: -Patient/Caregiver will self-administer medications as prescribed as evidenced by self-report/primary caregiver report  -Patient/Caregiver will attend all scheduled provider appointments as evidenced by clinician review of documented attendance to scheduled appointments and patient/caregiver report -Patient/Caregiver will call pharmacy for medication refills as evidenced by patient report and review of pharmacy fill history as appropriate -Patient/Caregiver will call provider office for new concerns or questions as evidenced by review of documented incoming telephone call notes and patient report -Patient/Caregiver verbalizes understanding of plan -Patient/Caregiver will focus on medication adherence by taking medications as prescribed  -Utilize cane and walker (assistive device) appropriately with ambulation  patient will demonstrate use of different relaxation  skills and/or diversional activities to assist with pain reduction (distraction, imagery, relaxation, massage, acupressure, TENS, heat, and cold application., patient will use pharmacological and nonpharmacological pain relief strategies as prescribed. , and patient will verbalize acceptable level of pain relief and ability to engage in desired activities          Our next appointment is by telephone on 02/16/23 at 9 am  Please call the care guide team at [phone] if you need to cancel or reschedule your appointment.   If you are experiencing a Mental Health or [place] or need someone to talk to, please call 1-800-273-TALK (toll free, 24 hour hotline)  Patient verbalizes  understanding of instructions and care plan provided today and agrees to view in [facility]. Active MyChart status and patient understanding of how to access instructions and care plan via MyChart confirmed with patient.     Lazaro Arms RN, BSN, [facility]   Phone: [phone]

## 2023-02-11 NOTE — Progress Notes (Signed)
[facility] Visit  Patient consented to have virtual visit and was identified by name and date of birth. Method of visit: Telephone  Encounter participants: Patient: Natalie [name] - located at home Provider: Martyn Malay - located at [facility] Others (if applicable): none  Chief Complaint: follow up  HPI:  Natalie [name] is a pleasant 32 yo with bipolar disorder and RA presenting for a virtual visit and check in. In person visit rescheduled due to husband's mobility.   RA Follows with Dr. Earnest Conroy. Natalie [name] really likes her. RA is under control. Currently on Embrel, Prednisone, Plaquenil. Pain is doing okay.   Contraception Menses have become irregular. Thinks her COCs caused weight gain. Not sexually active currently. Does not plan on becoming pregnant.  Currently on Lithium and Zyprexa.   ROS: per HPI  Pertinent PMHx:  RA  Bipolar   Exam:  Speaking in full sentences   Assessment/Plan:  Rheumatoid arthritis ([place]) Referral to Ophtho given Plaqenil use for baseline eye exam Discussed options for improving fitness--referral to [place]  Given mobility issues with RA, SCAT form completed   Morbid obesity ([place]) Discussed--doubt all weight due to [place] Referral to [facility] at follow up    Contraception Discussed risks of pregnancy while on Lithium + Zyprexa Declined additional contraception--currently abstinent  Continue to discuss   Time spent during visit with patient: 7 minutes

## 2023-02-12 ENCOUNTER — Encounter: Payer: Self-pay | Admitting: Family Medicine

## 2023-02-12 ENCOUNTER — Ambulatory Visit (INDEPENDENT_AMBULATORY_CARE_PROVIDER_SITE_OTHER): Payer: Medicaid Other | Admitting: Family Medicine

## 2023-02-12 ENCOUNTER — Encounter: Payer: Medicaid Other | Admitting: Family Medicine

## 2023-02-12 DIAGNOSIS — M059 Rheumatoid arthritis with rheumatoid factor, unspecified: Secondary | ICD-10-CM | POA: Diagnosis not present

## 2023-02-12 NOTE — Assessment & Plan Note (Signed)
Referral to Ophtho given Plaqenil use for baseline eye exam Discussed options for improving fitness--referral to [facility]

## 2023-02-12 NOTE — Assessment & Plan Note (Signed)
Discussed--doubt all weight due to [place] Referral to [facility] A1[facility] at follow up

## 2023-02-16 ENCOUNTER — Ambulatory Visit: Payer: Self-pay

## 2023-02-16 DIAGNOSIS — Z789 Other specified health status: Secondary | ICD-10-CM

## 2023-02-16 DIAGNOSIS — M0579 Rheumatoid arthritis with rheumatoid factor of multiple sites without organ or systems involvement: Secondary | ICD-10-CM

## 2023-02-16 NOTE — Patient Outreach (Signed)
  Care Coordination   Follow Up Visit Note   02/16/2023 Name: Natalie [name] MRN: [id number] DOB: 07/13/1991  Natalie [name] is a 32 y.o. year old female who sees Martyn Malay, MD for primary care. I spoke with  [name] by phone today.  What matters to the patients health and wellness today?  Durelle reports that her health has improved and she can now walk without her walker and cane for some time. She is still doing her stretches and is not experiencing any pain. She is taking her medication, and it is working well together, making her feel good. Rakiah thinks that Dr. Earnest Conroy is an excellent doctor for rheumatoid arthritis. She only needs to visit her in six months now.    Goals Addressed               This Visit's Progress     I am learning how to deal with my RA (pt-stated)        Patient Goals/Self Care Activities: -Patient/Caregiver will self-administer medications as prescribed as evidenced by self-report/primary caregiver report  -Patient/Caregiver will attend all scheduled provider appointments as evidenced by clinician review of documented attendance to scheduled appointments and patient/caregiver report -Patient/Caregiver will call pharmacy for medication refills as evidenced by patient report and review of pharmacy fill history as appropriate -Patient/Caregiver will call provider office for new concerns or questions as evidenced by review of documented incoming telephone call notes and patient report -Patient/Caregiver verbalizes understanding of plan -Patient/Caregiver will focus on medication adherence by taking medications as prescribed  -Utilize cane and walker (assistive device) appropriately with ambulation  patient will demonstrate use of different relaxation  skills and/or diversional activities to assist with pain reduction (distraction, imagery, relaxation, massage, acupressure, TENS, heat, and cold application. -the patient will continue with stretches  for exercises        SDOH assessments and interventions completed:  Yes  SDOH Interventions Today    Flowsheet Row Most Recent Value  SDOH Interventions   Transportation Interventions Other (Comment)  [referral to care guides]        Care Coordination Interventions:  Yes, provided   Lazaro Arms RN, BSN, [facility]   Phone: [phone]      Follow up plan: Follow up call scheduled for 04/20/23  9 am    Encounter Outcome:  Pt. Visit Completed   Lazaro Arms RN, BSN, [facility]   Phone: [phone]

## 2023-02-16 NOTE — Patient Instructions (Signed)
Visit Information  Thank you for taking time to visit with me today. Please don't hesitate to contact me if I can be of assistance to you.   Following are the goals we discussed today:   Goals Addressed               This Visit's Progress     I am learning how to deal with my RA (pt-stated)        Patient Goals/Self Care Activities: -Patient/Caregiver will self-administer medications as prescribed as evidenced by self-report/primary caregiver report  -Patient/Caregiver will attend all scheduled provider appointments as evidenced by clinician review of documented attendance to scheduled appointments and patient/caregiver report -Patient/Caregiver will call pharmacy for medication refills as evidenced by patient report and review of pharmacy fill history as appropriate -Patient/Caregiver will call provider office for new concerns or questions as evidenced by review of documented incoming telephone call notes and patient report -Patient/Caregiver verbalizes understanding of plan -Patient/Caregiver will focus on medication adherence by taking medications as prescribed  -Utilize cane and walker (assistive device) appropriately with ambulation  patient will demonstrate use of different relaxation  skills and/or diversional activities to assist with pain reduction (distraction, imagery, relaxation, massage, acupressure, TENS, heat, and cold application. -the patient will continue with stretches for exercises        Our next appointment is by telephone on 04/20/23 at 9 am  Please call the care guide team at [phone] if you need to cancel or reschedule your appointment.   If you are experiencing a Mental Health or [place] or need someone to talk to, please call 1-800-273-TALK (toll free, 24 hour hotline)  Patient verbalizes understanding of instructions and care plan provided today and agrees to view in [facility]. Active MyChart status and patient understanding of how to  access instructions and care plan via MyChart confirmed with patient.     Lazaro Arms RN, BSN, [facility]   Phone: [phone]
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
## 2023-03-01 ENCOUNTER — Telehealth: Payer: Self-pay

## 2023-03-01 ENCOUNTER — Encounter: Payer: Self-pay | Admitting: Family Medicine

## 2023-03-01 ENCOUNTER — Ambulatory Visit (INDEPENDENT_AMBULATORY_CARE_PROVIDER_SITE_OTHER): Payer: Medicaid Other | Admitting: Family Medicine

## 2023-03-01 ENCOUNTER — Other Ambulatory Visit: Payer: Self-pay

## 2023-03-01 VITALS — BP 103/68 | HR 124 | Temp 98.0°F | Ht 64.0 in | Wt 264.8 lb

## 2023-03-01 DIAGNOSIS — F429 Obsessive-compulsive disorder, unspecified: Secondary | ICD-10-CM | POA: Diagnosis not present

## 2023-03-01 DIAGNOSIS — F3181 Bipolar II disorder: Secondary | ICD-10-CM

## 2023-03-01 DIAGNOSIS — Z0000 Encounter for general adult medical examination without abnormal findings: Secondary | ICD-10-CM | POA: Diagnosis present

## 2023-03-01 DIAGNOSIS — R300 Dysuria: Secondary | ICD-10-CM | POA: Diagnosis not present

## 2023-03-01 DIAGNOSIS — M059 Rheumatoid arthritis with rheumatoid factor, unspecified: Secondary | ICD-10-CM

## 2023-03-01 LAB — POCT URINALYSIS DIP (MANUAL ENTRY)
Bilirubin, UA: NEGATIVE
Glucose, UA: NEGATIVE mg/dL
Ketones, POC UA: NEGATIVE mg/dL
Nitrite, UA: NEGATIVE
Protein Ur, POC: NEGATIVE mg/dL
Spec Grav, UA: 1.015 (ref 1.010–1.025)
Urobilinogen, UA: 0.2 E.U./dL
pH, UA: 7 (ref 5.0–8.0)

## 2023-03-01 NOTE — Patient Instructions (Signed)
It was wonderful to see you today.  Please bring ALL of your medications with you to every visit.   Today we talked about:  -Great job on your health  We will check blood work, including your lithium level  I ordered a new walker   Please follow up in 1 months for your Pap smear   Thank you for choosing [facility].   Please call [phone] with any questions about today's appointment.  Please be sure to schedule follow up at the front  desk before you leave today.   Dorris Singh, MD  Family Medicine

## 2023-03-01 NOTE — Assessment & Plan Note (Signed)
Doing well, monitoring labs today Weight gain has been considerable with both prednisone and Zyprexa Monitor, we discussed going to the gym today

## 2023-03-01 NOTE — Telephone Encounter (Signed)
Received DME order for Rollator for patient. Per Dr. Owens Shark, patient reports that brake on rollator is not working.   Patient received rollator in 04/2021. Community message sent to [employer] to determine next steps.   Talbot Grumbling, RN

## 2023-03-01 NOTE — Assessment & Plan Note (Signed)
This impairs her mobility New walker ordered  Completed SCAT form

## 2023-03-02 LAB — CBC WITH DIFFERENTIAL/PLATELET
Basophils Absolute: 0.1 10*3/uL (ref 0.0–0.2)
Basos: 1 %
EOS (ABSOLUTE): 0.1 10*3/uL (ref 0.0–0.4)
Eos: 1 %
Hematocrit: 38.5 % (ref 34.0–46.6)
Hemoglobin: 12.7 g/dL (ref 11.1–15.9)
Immature Grans (Abs): 0 10*3/uL (ref 0.0–0.1)
Immature Granulocytes: 0 %
Lymphocytes Absolute: 1.8 10*3/uL (ref 0.7–3.1)
Lymphs: 17 %
MCH: 30 pg (ref 26.6–33.0)
MCHC: 33 g/dL (ref 31.5–35.7)
MCV: 91 fL (ref 79–97)
Monocytes Absolute: 0.4 10*3/uL (ref 0.1–0.9)
Monocytes: 4 %
Neutrophils Absolute: 8.2 10*3/uL — ABNORMAL HIGH (ref 1.4–7.0)
Neutrophils: 77 %
Platelets: 236 10*3/uL (ref 150–450)
RBC: 4.23 x10E6/uL (ref 3.77–5.28)
RDW: 14.1 % (ref 11.7–15.4)
WBC: 10.6 10*3/uL (ref 3.4–10.8)

## 2023-03-02 LAB — COMPREHENSIVE METABOLIC PANEL
ALT: 14 IU/L (ref 0–32)
AST: 17 IU/L (ref 0–40)
Albumin/Globulin Ratio: 1.3 (ref 1.2–2.2)
Albumin: 4.3 g/dL (ref 3.9–4.9)
Alkaline Phosphatase: 161 IU/L — ABNORMAL HIGH (ref 44–121)
BUN/Creatinine Ratio: 11 (ref 9–23)
BUN: 7 mg/dL (ref 6–20)
Bilirubin Total: 0.5 mg/dL (ref 0.0–1.2)
CO2: 19 mmol/L — ABNORMAL LOW (ref 20–29)
Calcium: 9.5 mg/dL (ref 8.7–10.2)
Chloride: 107 mmol/L — ABNORMAL HIGH (ref 96–106)
Creatinine, Ser: 0.61 mg/dL (ref 0.57–1.00)
Globulin, Total: 3.3 g/dL (ref 1.5–4.5)
Glucose: 88 mg/dL (ref 70–99)
Potassium: 4.3 mmol/L (ref 3.5–5.2)
Sodium: 144 mmol/L (ref 134–144)
Total Protein: 7.6 g/dL (ref 6.0–8.5)
eGFR: 122 mL/min/{1.73_m2} (ref >59)

## 2023-03-02 LAB — LIPID PANEL
Chol/HDL Ratio: 2.8 ratio (ref 0.0–4.4)
Cholesterol, Total: 190 mg/dL (ref 100–199)
HDL: 67 mg/dL (ref >39)
LDL Chol Calc (NIH): 103 mg/dL — ABNORMAL HIGH (ref 0–99)
Triglycerides: 114 mg/dL (ref 0–149)
VLDL Cholesterol Cal: 20 mg/dL (ref 5–40)

## 2023-03-02 LAB — HEMOGLOBIN A1C
Est. average glucose Bld gHb Est-mCnc: 103 mg/dL
Hgb A1c MFr Bld: 5.2 % (ref 4.8–5.6)

## 2023-03-02 LAB — LITHIUM LEVEL: Lithium Lvl: 0.6 mmol/L (ref 0.5–1.2)

## 2023-03-03 LAB — URINE CULTURE

## 2023-03-04 ENCOUNTER — Telehealth: Payer: Self-pay | Admitting: Family Medicine

## 2023-03-04 DIAGNOSIS — N3000 Acute cystitis without hematuria: Secondary | ICD-10-CM

## 2023-03-04 MED ORDER — AMOXICILLIN 500 MG PO TABS: 500.0000 mg | ORAL_TABLET | Freq: Three times a day (TID) | ORAL | 0 refills | Status: DC

## 2023-03-04 NOTE — Telephone Encounter (Signed)
Called patient. Has GBS UTI. She is not pregnant. No allergies. RX to pharmacy. All questions answered.   Dorris Singh, MD  Family Medicine Teaching Service

## 2023-04-08 NOTE — Progress Notes (Signed)
Error

## 2023-04-09 ENCOUNTER — Encounter (INDEPENDENT_AMBULATORY_CARE_PROVIDER_SITE_OTHER): Payer: Medicaid Other | Admitting: Family Medicine

## 2023-04-09 DIAGNOSIS — Z124 Encounter for screening for malignant neoplasm of cervix: Secondary | ICD-10-CM

## 2023-04-21 ENCOUNTER — Ambulatory Visit: Payer: Self-pay

## 2023-04-21 NOTE — Patient Instructions (Signed)
Visit Information  Thank you for taking time to visit with me today. Please don't hesitate to contact me if I can be of assistance to you.   Following are the goals we discussed today:   Goals Addressed               This Visit's Progress     I am learning how to deal with my RA (pt-stated)        Patient Goals/Self Care Activities: -Patient/Caregiver will self-administer medications as prescribed as evidenced by self-report/primary caregiver report  -Patient/Caregiver will attend all scheduled provider appointments as evidenced by clinician review of documented attendance to scheduled appointments and patient/caregiver report -Patient/Caregiver will call pharmacy for medication refills as evidenced by patient report and review of pharmacy fill history as appropriate -Patient/Caregiver will call provider office for new concerns or questions as evidenced by review of documented incoming telephone call notes and patient report -Patient/Caregiver verbalizes understanding of plan -Patient/Caregiver will focus on medication adherence by taking medications as prescribed  -Utilize cane and walker (assistive device) appropriately with ambulation  patient will demonstrate use of different relaxation  skills and/or diversional activities to assist with pain reduction (distraction, imagery, relaxation, massage, acupressure, TENS, heat, and cold application. -the patient will continue with stretches for exercises and help with pain.        Our next appointment is by telephone on 05/25/23 at 9 am  Please call the care guide team at [phone] if you need to cancel or reschedule your appointment.   If you are experiencing a Mental Health or Behavioral Health Crisis or need someone to talk to, please call 1-800-273-TALK (toll free, 24 hour hotline)  The patient verbalized understanding of instructions, educational materials, and care plan provided today and agreed to receive a mailed copy of  patient instructions, educational materials, and care plan.   [name] RN, BSN, [facility] Care Coordinator [facility]   Phone: [phone]

## 2023-04-21 NOTE — Patient Outreach (Signed)
  Care Coordination   Follow Up Visit Note   04/21/2023 Name: Natalie [name] MRN: [id number] DOB: 01-Oct-1991  Natalie [name] is a 32 y.o. year old female who sees Westley Chandler, MD for primary care. I spoke with  Natalie [name] by phone today.  What matters to the patients health and wellness today?  Natalie [name]'s health is on an upward path. She has successfully stopped taking prednisone and reports that the medication has fully integrated into her system, alleviating her pain. Her mobility has also improved, as she now occasionally walks without the aid of her walker and cane, although I have advised her to have the assistance of a mobility device when she goes out.    Goals Addressed               This Visit's Progress     I am learning how to deal with my RA (pt-stated)        Patient Goals/Self Care Activities: -Patient/Caregiver will self-administer medications as prescribed as evidenced by self-report/primary caregiver report  -Patient/Caregiver will attend all scheduled provider appointments as evidenced by clinician review of documented attendance to scheduled appointments and patient/caregiver report -Patient/Caregiver will call pharmacy for medication refills as evidenced by patient report and review of pharmacy fill history as appropriate -Patient/Caregiver will call provider office for new concerns or questions as evidenced by review of documented incoming telephone call notes and patient report -Patient/Caregiver verbalizes understanding of plan -Patient/Caregiver will focus on medication adherence by taking medications as prescribed  -Utilize cane and walker (assistive device) appropriately with ambulation  patient will demonstrate use of different relaxation  skills and/or diversional activities to assist with pain reduction (distraction, imagery, relaxation, massage, acupressure, TENS, heat, and cold application. -the patient will continue with stretches for exercises  and help with pain.        SDOH assessments and interventions completed:  No     Care Coordination Interventions:  Yes, provided   Interventions Today    Flowsheet Row Most Recent Value  Chronic Disease   Chronic disease during today's visit Other  [Rhuematoid Arthritis]  General Interventions   General Interventions Discussed/Reviewed General Interventions Reviewed  Exercise Interventions   Exercise Discussed/Reviewed Physical Activity  Physical Activity Discussed/Reviewed Physical Activity Discussed  Mental Health Interventions   Mental Health Discussed/Reviewed Mental Health Discussed  Pharmacy Interventions   Pharmacy Dicussed/Reviewed Pharmacy Topics Discussed  Safety Interventions   Safety Discussed/Reviewed Safety Discussed        Follow up plan: Follow up call scheduled for 05/25/23  9 am    Encounter Outcome:  Pt. Visit Completed   [name] RN, BSN, [facility] Care Coordinator [facility]   Phone: [phone]

## 2023-05-25 ENCOUNTER — Ambulatory Visit: Payer: Self-pay

## 2023-05-25 NOTE — Patient Outreach (Signed)
  Care Coordination   Follow Up Visit Note   05/25/2023 Name: Natalie [name] MRN: [id number] DOB: 07-20-1991  Natalie [name] is a 32 y.o. year old female who sees Westley Chandler, MD for primary care. I spoke with  Natalie [name] by phone today.  What matters to the patients health and wellness today?  Natalie [name], who is currently coping with her husband's absence due to his rehab, has been feeling unwell and stressed. Despite diligently taking her prescribed medication, she has stopped the prednisone 5 mg  as told and is experiencing physical discomfort. I  suggested heat therapy and stretching exercises, but unfortunately, she has not found relief. I offered to schedule an appointment for her, but she declined. I advised her to continue with her stretches, use heat, and to not hesitate to call the office if she needs to be seen.     Goals Addressed               This Visit's Progress     I am learning how to deal with my RA (pt-stated)        Patient Goals/Self Care Activities: -Patient/Caregiver will self-administer medications as prescribed as evidenced by self-report/primary caregiver report  -Patient/Caregiver will attend all scheduled provider appointments as evidenced by clinician review of documented attendance to scheduled appointments and patient/caregiver report -Patient/Caregiver will call pharmacy for medication refills as evidenced by patient report and review of pharmacy fill history as appropriate -Patient/Caregiver will call provider office for new concerns or questions as evidenced by review of documented incoming telephone call notes and patient report -Patient/Caregiver verbalizes understanding of plan -Patient/Caregiver will focus on medication adherence by taking medications as prescribed  -Utilize cane and walker (assistive device) appropriately with ambulation  patient will use of heat,  application. -the patient will continue with stretches for exercises  and help with pain. -patient advised to call her doctor to call and inform them of her symptoms.        SDOH assessments and interventions completed:  No     Care Coordination Interventions:  Yes, provided   Interventions Today    Flowsheet Row Most Recent Value  Chronic Disease   Chronic disease during today's visit Other  [Rheumatiod Arthritis]  General Interventions   General Interventions Discussed/Reviewed General Interventions Discussed, General Interventions Reviewed, Durable Medical Equipment (DME)  Durable Medical Equipment (DME) Walker  Exercise Interventions   Exercise Discussed/Reviewed Physical Activity  Pharmacy Interventions   Pharmacy Dicussed/Reviewed Pharmacy Topics Discussed  Safety Interventions   Safety Discussed/Reviewed Safety Reviewed        Follow up plan: Follow up call scheduled for 06/02/23  1030 am    Encounter Outcome:  Pt. Visit Completed   [name] RN, BSN, [facility] Care Coordinator [facility]   Phone: [phone]

## 2023-05-25 NOTE — Patient Instructions (Signed)
Visit Information  Thank you for taking time to visit with me today. Please don't hesitate to contact me if I can be of assistance to you.   Following are the goals we discussed today:   Goals Addressed               This Visit's Progress     I am learning how to deal with my RA (pt-stated)        Patient Goals/Self Care Activities: -Patient/Caregiver will self-administer medications as prescribed as evidenced by self-report/primary caregiver report  -Patient/Caregiver will attend all scheduled provider appointments as evidenced by clinician review of documented attendance to scheduled appointments and patient/caregiver report -Patient/Caregiver will call pharmacy for medication refills as evidenced by patient report and review of pharmacy fill history as appropriate -Patient/Caregiver will call provider office for new concerns or questions as evidenced by review of documented incoming telephone call notes and patient report -Patient/Caregiver verbalizes understanding of plan -Patient/Caregiver will focus on medication adherence by taking medications as prescribed  -Utilize cane and walker (assistive device) appropriately with ambulation  patient will use of heat,  application. -the patient will continue with stretches for exercises and help with pain. -patient advised to call her doctor to call and inform them of her symptoms.        Our next appointment is by telephone on 06/02/23 at 1030 am  Please call the care guide team at [phone] if you need to cancel or reschedule your appointment.   If you are experiencing a Mental Health or Behavioral Health Crisis or need someone to talk to, please call 1-800-273-TALK (toll free, 24 hour hotline)  Patient verbalizes understanding of instructions and care plan provided today and agrees to view in MyChart. Active MyChart status and patient understanding of how to access instructions and care plan via MyChart confirmed with patient.      [name] RN, BSN, [facility] Care Coordinator [facility]   Phone: [phone]

## 2023-05-28 ENCOUNTER — Ambulatory Visit: Payer: Medicaid Other | Admitting: Family Medicine

## 2023-06-02 ENCOUNTER — Ambulatory Visit: Payer: Self-pay

## 2023-06-02 NOTE — Patient Outreach (Signed)
  Care Coordination   Follow Up Visit Note   06/02/2023 Name: Natalie [name] MRN: [id number] DOB: 16-Aug-1991  Natalie [name] is a 32 y.o. year old female who sees Natalie Chandler, MD for primary care. I spoke with  Natalie [name] by phone today.  What matters to the patients health and wellness today?  Natalie [name] is improving. Her body feels better, but she still experiences some pain and soreness. She is gradually adjusting to it and has started doing stretching exercises. She is keen on establishing a regular stretching routine. When necessary for fall prevention, she continues to walk with her rollator. Her husband is still in rehab, but she intends to visit him on his birthday, which is on 06/11/[birth year].     Goals Addressed               This Visit's Progress     I am learning how to deal with my RA (pt-stated)        Patient Goals/Self Care Activities: -Patient/Caregiver will self-administer medications as prescribed as evidenced by self-report/primary caregiver report  -Patient/Caregiver will attend all scheduled provider appointments as evidenced by clinician review of documented attendance to scheduled appointments and patient/caregiver report -Patient/Caregiver will call pharmacy for medication refills as evidenced by patient report and review of pharmacy fill history as appropriate -Patient/Caregiver will call provider office for new concerns or questions as evidenced by review of documented incoming telephone call notes and patient report -Patient/Caregiver verbalizes understanding of plan -Patient/Caregiver will focus on medication adherence by taking medications as prescribed  -Utilize cane and walker (assistive device) appropriately with ambulation  patient will use of heat,  application. -the patient will continue with stretches for exercises and help with pain. -patient advised to call her doctor to call and inform them of her symptoms. -I am proud of your positive  attitude and wanting to start a stretching routine        SDOH assessments and interventions completed:  No     Care Coordination Interventions:  Yes, provided   Interventions Today    Flowsheet Row Most Recent Value  Chronic Disease   Chronic disease during today's visit Other  [Rheumatoid Arthritis]  General Interventions   General Interventions Discussed/Reviewed General Interventions Discussed, General Interventions Reviewed, Durable Medical Equipment (DME)  Durable Medical Equipment (DME) Walker  Exercise Interventions   Exercise Discussed/Reviewed Physical Activity  Physical Activity Discussed/Reviewed Physical Activity Reviewed, Physical Activity Discussed  Pharmacy Interventions   Pharmacy Dicussed/Reviewed Pharmacy Topics Discussed  Safety Interventions   Safety Discussed/Reviewed Safety Discussed, Safety Reviewed        Follow up plan: Follow up call scheduled for 07/06/23  10 am    Encounter Outcome:  Pt. Visit Completed   [name] RN, BSN, [facility] Care Coordinator [facility]   Phone: [phone]

## 2023-06-02 NOTE — Patient Instructions (Signed)
Visit Information  Thank you for taking time to visit with me today. Please don't hesitate to contact me if I can be of assistance to you.   Following are the goals we discussed today:   Goals Addressed               This Visit's Progress     I am learning how to deal with my RA (pt-stated)        Patient Goals/Self Care Activities: -Patient/Caregiver will self-administer medications as prescribed as evidenced by self-report/primary caregiver report  -Patient/Caregiver will attend all scheduled provider appointments as evidenced by clinician review of documented attendance to scheduled appointments and patient/caregiver report -Patient/Caregiver will call pharmacy for medication refills as evidenced by patient report and review of pharmacy fill history as appropriate -Patient/Caregiver will call provider office for new concerns or questions as evidenced by review of documented incoming telephone call notes and patient report -Patient/Caregiver verbalizes understanding of plan -Patient/Caregiver will focus on medication adherence by taking medications as prescribed  -Utilize cane and walker (assistive device) appropriately with ambulation  patient will use of heat,  application. -the patient will continue with stretches for exercises and help with pain. -patient advised to call her doctor to call and inform them of her symptoms. -I am proud of your positive attitude and wanting to start a stretching routine        Our next appointment is by telephone on 07/06/23 at 10 am  Please call the care guide team at [phone] if you need to cancel or reschedule your appointment.   If you are experiencing a Mental Health or Behavioral Health Crisis or need someone to talk to, please call 1-800-273-TALK (toll free, 24 hour hotline)  Patient verbalizes understanding of instructions and care plan provided today and agrees to view in MyChart. Active MyChart status and patient understanding of  how to access instructions and care plan via MyChart confirmed with patient.     [name] RN, BSN, [facility] Care Coordinator [facility]   Phone: [phone]

## 2023-07-01 NOTE — Progress Notes (Signed)
    SUBJECTIVE:   CHIEF COMPLAINT: Pap smear HPI:   Natalie [name] is a 32 y.o.  with history notable for obesity and bipolar presenting for Pap smear. She uses nothing for contraception. She reports PJ is in a SNF again. She lives alone. Has a history of RA and falls every 1-2 weeks. Sometimes has difficulty getting out of bed due to pain. Requires HOB elevation to facilitate safe transfers from bed. Uses a walker with success.  She is due for her Pap. Uses abstinence for contraception. LMP July 19. Periods are somewhat irregular but not heavy.   She had elevated ALP at last visit. Denies alcohol use.   PERTINENT  PMH / PSH/Family/Social History : Bipolar disorder   OBJECTIVE:   BP 115/76   Pulse 94   Ht 5\' 4"  (1.626 m)   Wt 270 lb (122.5 kg)   SpO2 97%   BMI 46.35 kg/m   Today's weight:  Last Weight  Most recent update: 07/02/2023  8:57 AM    Weight  122.5 kg (270 lb)            Review of prior weights: [employer]   07/02/23 0857  Weight: 270 lb (122.5 kg)    Initial HR and BP elevated due to severe anxiety   GU Exam:    External exam: Normal-appearing female external genitalia.  Vaginal exam notable for normal apperance.  Cervix without discharge or obvious lesion. Chaperoned examine, CMA [place]  ASSESSMENT/PLAN:   Rheumatoid arthritis (HCC) Given issues with transfers and pain with laying flat, hospital bed ordered  This will facilitate safe transfers and easier transition to walker, ease pain   Elevated ALP Repeat with GGT today   Cervical Cancer screening Sent cotesting and GC/CT  Consider TSH at follow up     [name], MD  Family Medicine Teaching Service  [facility]

## 2023-07-02 ENCOUNTER — Other Ambulatory Visit (HOSPITAL_COMMUNITY)
Admission: RE | Admit: 2023-07-02 | Discharge: 2023-07-02 | Disposition: A | Discharge status: Home / Self Care | Payer: MEDICAID | Source: Ambulatory Visit | Attending: Family Medicine | Admitting: Family Medicine

## 2023-07-02 ENCOUNTER — Ambulatory Visit: Payer: MEDICAID | Admitting: Family Medicine

## 2023-07-02 ENCOUNTER — Other Ambulatory Visit: Payer: Self-pay

## 2023-07-02 ENCOUNTER — Encounter: Payer: Self-pay | Admitting: Family Medicine

## 2023-07-02 VITALS — BP 115/76 | HR 94 | Ht 64.0 in | Wt 270.0 lb

## 2023-07-02 DIAGNOSIS — M059 Rheumatoid arthritis with rheumatoid factor, unspecified: Secondary | ICD-10-CM | POA: Diagnosis not present

## 2023-07-02 DIAGNOSIS — R748 Abnormal levels of other serum enzymes: Secondary | ICD-10-CM | POA: Diagnosis not present

## 2023-07-02 DIAGNOSIS — Z124 Encounter for screening for malignant neoplasm of cervix: Secondary | ICD-10-CM | POA: Diagnosis not present

## 2023-07-02 DIAGNOSIS — Z113 Encounter for screening for infections with a predominantly sexual mode of transmission: Secondary | ICD-10-CM | POA: Diagnosis not present

## 2023-07-02 NOTE — Patient Instructions (Signed)
It was wonderful to see you today.  Please bring ALL of your medications with you to every visit.   Today we talked about:   - Great job with your Pap  - I wrote a letter about your key to the mailbox  - I will call or message with results  We will fax the SCAT form  Please follow up in 3 months   Thank you for choosing [facility].   Please call [phone] with any questions about today's appointment.  Please be sure to schedule follow up at the front  desk before you leave today.   [name], MD  Family Medicine

## 2023-07-02 NOTE — Assessment & Plan Note (Signed)
Given issues with transfers and pain with laying flat, hospital bed ordered  This will facilitate safe transfers and easier transition to walker, ease pain

## 2023-07-06 ENCOUNTER — Ambulatory Visit: Payer: Self-pay

## 2023-07-06 NOTE — Patient Outreach (Signed)
  Care Coordination   Follow Up Visit Note   07/06/2023 Name: Natalie [name] MRN: [id number] DOB: 1991-03-11  Natalie [name] is a 32 y.o. year old female who sees Westley Chandler, MD for primary care. I spoke with  [name] by phone today.  What matters to the patients health and wellness today?  Natalie [name] is experiencing significant distress due to the recent passing of her husband from a diabetic coma. She has expressed difficulty in coping with this loss. While she has the support of her parents, I have recommended that she also seek guidance from her counselor. Natalie [name] has mentioned the challenges she is facing in arranging her husband's funeral, adding to her emotional burden.  She said that she is also dealing with her body aching and sore.     Goals Addressed               This Visit's Progress     I am dealing with my husbands passing        Care Coordination Interventions:  Active listening / Reflection utilized  Emotional Support Provided Problem Solving /Task Center strategies reviewed Advised the patient to call her counselor to talk about the situation Make sure to take your medications        I am learning how to deal with my RA (pt-stated)        Patient Goals/Self Care Activities: -Patient/Caregiver will self-administer medications as prescribed as evidenced by self-report/primary caregiver report  -Patient/Caregiver will attend all scheduled provider appointments as evidenced by clinician review of documented attendance to scheduled appointments and patient/caregiver report -Patient/Caregiver will call pharmacy for medication refills as evidenced by patient report and review of pharmacy fill history as appropriate -Patient/Caregiver will call provider office for new concerns or questions as evidenced by review of documented incoming telephone call notes and patient report -Patient/Caregiver will focus on medication adherence by taking medications as  prescribed  -Utilize cane and walker (assistive device) appropriately with ambulation  -patient advised to call her doctor to call and inform them of her symptoms.        SDOH assessments and interventions completed:  No     Care Coordination Interventions:  Yes, provided   Interventions Today    Flowsheet Row Most Recent Value  Chronic Disease   Chronic disease during today's visit Other  [Rheumatid Arthritiis]  General Interventions   General Interventions Discussed/Reviewed General Interventions Discussed  Exercise Interventions   Exercise Discussed/Reviewed Physical Activity  Physical Activity Discussed/Reviewed Physical Activity Discussed  [Stretching]  Mental Health Interventions   Mental Health Discussed/Reviewed Anxiety, Grief and Loss, Mental Health Discussed  Pharmacy Interventions   Pharmacy Dicussed/Reviewed Pharmacy Topics Discussed  Safety Interventions   Safety Discussed/Reviewed Safety Reviewed        Follow up plan: Follow up call scheduled for 07/27/23  11 am    Encounter Outcome:  Pt. Visit Completed   [name] RN, BSN, [facility] Care Coordinator [facility]   Phone: [phone]

## 2023-07-06 NOTE — Patient Instructions (Signed)
Visit Information  Thank you for taking time to visit with me today. Please don't hesitate to contact me if I can be of assistance to you.   Following are the goals we discussed today:   Goals Addressed               This Visit's Progress     I am dealing with my husbands passing        Care Coordination Interventions:  Active listening / Reflection utilized  Emotional Support Provided Problem Solving /Task Center strategies reviewed Advised the patient to call her counselor to talk about the situation Make sure to take your medications        I am learning how to deal with my RA (pt-stated)        Patient Goals/Self Care Activities: -Patient/Caregiver will self-administer medications as prescribed as evidenced by self-report/primary caregiver report  -Patient/Caregiver will attend all scheduled provider appointments as evidenced by clinician review of documented attendance to scheduled appointments and patient/caregiver report -Patient/Caregiver will call pharmacy for medication refills as evidenced by patient report and review of pharmacy fill history as appropriate -Patient/Caregiver will call provider office for new concerns or questions as evidenced by review of documented incoming telephone call notes and patient report -Patient/Caregiver will focus on medication adherence by taking medications as prescribed  -Utilize cane and walker (assistive device) appropriately with ambulation  -patient advised to call her doctor to call and inform them of her symptoms.        Our next appointment is by telephone on 07/27/23 at 11 am  Please call the care guide team at [phone] if you need to cancel or reschedule your appointment.   If you are experiencing a Mental Health or Behavioral Health Crisis or need someone to talk to, please call 1-800-273-TALK (toll free, 24 hour hotline)  Patient verbalizes understanding of instructions and care plan provided today and agrees to view  in MyChart. Active MyChart status and patient understanding of how to access instructions and care plan via MyChart confirmed with patient.     [name] RN, BSN, [facility] Care Coordinator [facility]   Phone: [phone]

## 2023-07-07 ENCOUNTER — Telehealth: Payer: Self-pay

## 2023-07-07 NOTE — Telephone Encounter (Signed)
Community message sent to [employer] for bariatric hospital bed. Will await response.   Veronda Prude, RN

## 2023-07-07 NOTE — Telephone Encounter (Signed)
Receipt confirmed by [employer].    C , RN  

## 2023-07-07 NOTE — Telephone Encounter (Signed)
Please see the below message from [employer].   bed justification statement needed: Pt requires positioning of body in ways not feasible in ordinary bed. Pt could also require attachments that cannot be used on ordinary bed. Pt requires frequent/sometimes immediate change in body position requiring adjustments to head/foot. Pt can safely & independently operate controls to cause adjustments. [name] weight > 350 lbs.   Veronda Prude, RN

## 2023-07-09 ENCOUNTER — Telehealth: Payer: Self-pay

## 2023-07-09 NOTE — Telephone Encounter (Signed)
Pt called inquiring about lab results (blood and Pap). I read to pt Dr Theora Gianotti notes as pt is not using mychart. Pt is asking that Dr Manson Passey give her a call on [phone]. (Verifed phone number with pt). Sunday Spillers, CMA

## 2023-07-09 NOTE — Telephone Encounter (Signed)
Patient called. Her husband died on 2024/08/17of diabetic coma. He was SNF.   Reviewed labs, all questions answered. Support provided.  [name], MD  Family Medicine Teaching Service

## 2023-07-12 ENCOUNTER — Telehealth: Payer: Self-pay

## 2023-07-12 NOTE — Telephone Encounter (Signed)
Paperwork faxed to [employer] [place]. Copy made and placed in batch scanning.   Veronda Prude, RN

## 2023-07-21 ENCOUNTER — Telehealth: Payer: Self-pay | Admitting: Family Medicine

## 2023-07-21 NOTE — Telephone Encounter (Signed)
Mother dropped off form at front desk for [place].  Verified that patient section of form has been completed.  Last DOS/WCC with PCP was 07/02/23.  Placed form in red team folder to be completed by clinical staff.  Vilinda Blanks

## 2023-07-22 NOTE — Telephone Encounter (Signed)
Reviewed, completed, and signed form.  Note routed to RN team inbasket and placed completed form in RN Wall pocket in the front office.  Carina M Brown, MD  

## 2023-07-22 NOTE — Telephone Encounter (Signed)
Form has been placed in your box to be completed, please finish when you have time. Thank you! Dayshia Ottley CMA  

## 2023-07-23 NOTE — Telephone Encounter (Signed)
Form placed up front for pick up.   Copy made for batch scanning.   Mother aware.  

## 2023-07-27 ENCOUNTER — Ambulatory Visit: Payer: Self-pay

## 2023-07-27 NOTE — Patient Outreach (Signed)
  Care Coordination   Follow Up Visit Note   07/27/2023 Name: Natalie [name] MRN: [id number] DOB: [birth year]  Natalie [name] is a 32 y.o. year old female who sees Westley Chandler, MD for primary care. I spoke with  Natalie [name] by phone today.  What matters to the patients health and wellness today?  Natalie [name], currently residing with her parents in [place], is trying to secure transportation to her medical appointments. While still mourning er the absence of her husband, her mother, Natalie [name], talked with me at [facility] request, informing me of her assistance in organizing Natalie [name]'s affairs. Treina insists on having all her medical providers located in [place] to facilitate transportation accessibility. It is important for Natalie [name] to adhere to her medication regimen, perform  stretches, and utilize her walker when necessary. Grappling with numerous challenges, I suggested that she seek guidance from her counselor.     Goals Addressed               This Visit's Progress     I am learning how to deal with my RA (pt-stated)        Patient Goals/Self Care Activities: -Patient/Caregiver will self-administer medications as prescribed as evidenced by self-report/primary caregiver report  -Patient/Caregiver will attend all scheduled provider appointments as evidenced by clinician review of documented attendance to scheduled appointments and patient/caregiver report -Patient/Caregiver will call pharmacy for medication refills as evidenced by patient report and review of pharmacy fill history as appropriate -Patient/Caregiver will call provider office for new concerns or questions as evidenced by review of documented incoming telephone call notes and patient report -Patient/Caregiver will focus on medication adherence by taking medications as prescribed  -Utilize cane and walker (assistive device) appropriately with ambulation  -go to your next appointments and ask for referrals          SDOH assessments and interventions completed:  No     Care Coordination Interventions:  Yes, provided   Interventions Today    Flowsheet Row Most Recent Value  Chronic Disease   Chronic disease during today's visit Other  [Rheumatoid arthritis]  General Interventions   General Interventions Discussed/Reviewed General Interventions Discussed  [New specialits in [place]]  Mental Health Interventions   Mental Health Discussed/Reviewed Anxiety  Pharmacy Interventions   Pharmacy Dicussed/Reviewed Pharmacy Topics Discussed  Safety Interventions   Safety Discussed/Reviewed Safety Reviewed, Fall Risk        Follow up plan: Follow up call scheduled for 08/31/23  10 am    Encounter Outcome:  Pt. Visit Completed   [name] RN, BSN, [facility]   Care Coordinator Phone: [phone]

## 2023-07-27 NOTE — Patient Instructions (Signed)
Visit Information  Thank you for taking time to visit with me today. Please don't hesitate to contact me if I can be of assistance to you.   Following are the goals we discussed today:   Goals Addressed               This Visit's Progress     I am learning how to deal with my RA (pt-stated)        Patient Goals/Self Care Activities: -Patient/Caregiver will self-administer medications as prescribed as evidenced by self-report/primary caregiver report  -Patient/Caregiver will attend all scheduled provider appointments as evidenced by clinician review of documented attendance to scheduled appointments and patient/caregiver report -Patient/Caregiver will call pharmacy for medication refills as evidenced by patient report and review of pharmacy fill history as appropriate -Patient/Caregiver will call provider office for new concerns or questions as evidenced by review of documented incoming telephone call notes and patient report -Patient/Caregiver will focus on medication adherence by taking medications as prescribed  -Utilize cane and walker (assistive device) appropriately with ambulation  -go to your next appointments and ask for referrals         Our next appointment is by telephone on 08/31/23 at 10 am  Please call the care guide team at [phone] if you need to cancel or reschedule your appointment.   If you are experiencing a Mental Health or Behavioral Health Crisis or need someone to talk to, please call 1-800-273-TALK (toll free, 24 hour hotline)  Patient verbalizes understanding of instructions and care plan provided today and agrees to view in MyChart. Active MyChart status and patient understanding of how to access instructions and care plan via MyChart confirmed with patient.     [name] RN, BSN, [facility] Phone: [phone]

## 2023-08-31 ENCOUNTER — Ambulatory Visit: Payer: Self-pay

## 2023-08-31 NOTE — Patient Outreach (Signed)
Care Coordination   Follow Up Visit Note   08/31/2023 Name: Natalie [name] MRN: [id number] DOB: [birth year]  Natalie [name] is a 32 y.o. year old female who sees Natalie Chandler, MD for primary care. I spoke with  Natalie [name] by phone today.  What matters to the patients health and wellness today?  I spoke with Natalie [name], who mentioned that her mother has been providing a lot of assistance. Her mother is helping her get SCAT approval. Additionally, her mother has taken over managing her finances. Natalie [name]'s parents visited her apartment, cleaned it, and helped her get new furniture. After her appointment with her rheumatologist, she started feeling better, so she has stopped taking Prednisone and Enbrel. However, she continues with her other medications, doing stretches, and using a walker. I suggested that she should keep in touch with her rheumatologist in case she experiences any pain.     Goals Addressed               This Visit's Progress     I am learning how to deal with my RA (pt-stated)        Patient Goals/Self Care Activities: -Patient/Caregiver will self-administer medications as prescribed as evidenced by self-report/primary caregiver report  -Patient/Caregiver will attend all scheduled provider appointments as evidenced by clinician review of documented attendance to scheduled appointments and patient/caregiver report -Patient/Caregiver will call pharmacy for medication refills as evidenced by patient report and review of pharmacy fill history as appropriate -Patient/Caregiver will call provider office for new concerns or questions as evidenced by review of documented incoming telephone call notes and patient report -Patient/Caregiver will focus on medication adherence by taking medications as prescribed  -Utilize cane and walker (assistive device) appropriately with ambulation  -Continue stretches -stay in touch with your Rheumatologist        SDOH assessments  and interventions completed:  No     Care Coordination Interventions:  Yes, provided   Interventions Today    Flowsheet Row Most Recent Value  Chronic Disease   Chronic disease during today's visit Other  [Rheumatoid Arthritis]  General Interventions   General Interventions Discussed/Reviewed General Interventions Discussed  Exercise Interventions   Exercise Discussed/Reviewed Exercise Discussed  [WNUUV stretches]  Nutrition Interventions   Nutrition Discussed/Reviewed Nutrition Discussed, Fluid intake  Pharmacy Interventions   Pharmacy Dicussed/Reviewed Pharmacy Topics Discussed  Safety Interventions   Safety Discussed/Reviewed Fall Risk, Safety Reviewed        Follow up plan: Follow up call scheduled for 10/05/23  10 am  Encounter Outcome:  Patient Visit Completed   [name] RN, BSN, [facility]   Care Coordinator Phone: [phone]

## 2023-08-31 NOTE — Patient Instructions (Addendum)
Visit Information  Thank you for taking time to visit with me today. Please don't hesitate to contact me if I can be of assistance to you.   Following are the goals we discussed today:   Goals Addressed               This Visit's Progress     I am learning how to deal with my RA (pt-stated)        Patient Goals/Self Care Activities: -Patient/Caregiver will self-administer medications as prescribed as evidenced by self-report/primary caregiver report  -Patient/Caregiver will attend all scheduled provider appointments as evidenced by clinician review of documented attendance to scheduled appointments and patient/caregiver report -Patient/Caregiver will call pharmacy for medication refills as evidenced by patient report and review of pharmacy fill history as appropriate -Patient/Caregiver will call provider office for new concerns or questions as evidenced by review of documented incoming telephone call notes and patient report -Patient/Caregiver will focus on medication adherence by taking medications as prescribed  -Utilize cane and walker (assistive device) appropriately with ambulation  -Continue stretches -stay in touch with your Rheumatologist        Our next appointment is by telephone on 12/04/22 at 10 AM  amPlease call the care guide team at [phone] if you need to cancel or reschedule your appointment.   If you are experiencing a Mental Health or Behavioral Health Crisis or need someone to talk to, please call 1-800-273-TALK (toll free, 24 hour hotline)  Patient verbalizes understanding of instructions and care plan provided today and agrees to view in MyChart. Active MyChart status and patient understanding of how to access instructions and care plan via MyChart confirmed with patient.     [name] RN, BSN, [facility] Phone: [phone]

## 2023-10-05 ENCOUNTER — Ambulatory Visit: Payer: Self-pay

## 2023-10-05 NOTE — Patient Instructions (Addendum)
Visit Information  Thank you for taking time to visit with me today. Please don't hesitate to contact me if I can be of assistance to you.   Following are the goals we discussed today:   Goals Addressed               This Visit's Progress     I am learning how to deal with my RA (pt-stated)        Patient Goals/Self Care Activities: -Patient/Caregiver will self-administer medications as prescribed as evidenced by self-report/primary caregiver report  -Patient/Caregiver will attend all scheduled provider appointments as evidenced by clinician review of documented attendance to scheduled appointments and patient/caregiver report -Patient/Caregiver will call pharmacy for medication refills as evidenced by patient report and review of pharmacy fill history as appropriate -Patient/Caregiver will call provider office for new concerns or questions as evidenced by review of documented incoming telephone call notes and patient report -Patient/Caregiver will focus on medication adherence by taking medications as prescribed  -Utilize cane and walker (assistive device) appropriately with ambulation  -Continue stretches -find you a new Rheumatologist in [place] to help manage your pain         If you are experiencing a Mental Health or Behavioral Health Crisis or need someone to talk to, please call 1-800-273-TALK (toll free, 24 hour hotline)  Patient verbalizes understanding of instructions and care plan provided today and agrees to view in MyChart. Active MyChart status and patient understanding of how to access instructions and care plan via MyChart confirmed with patient.     The patient will follow up with Danise Edge RN 10/19/23  10 am

## 2023-10-05 NOTE — Patient Outreach (Signed)
  Care Coordination   Follow Up Visit Note   10/05/2023 Name: Natalie [name] MRN: [id number] DOB: [birth year]  Natalie [name] is a 32 y.o. year old female who sees Westley Chandler, MD for primary care. I spoke with  Natalie [name] by phone today.  What matters to the patients health and wellness today?  I spoke with Natalie [name] this morning and let her know that I will no longer be her case manager. She will have a new case manager, Natalie [name], who will call her. She let me know that she is still having a hard time since her husband has passed. She said mentally, she is doing OK, but physically, she's having a hard time with her rheumatoid arthritis, and with the weather change, it's hard for her to move. She does some stretching, but less than she used to. She is still using her Walker. She will leave her rheumatologist in [facility] and go to one in [place] once she is referred because she has scat. Also, her mother got her an unlimited bus pass.      Goals Addressed               This Visit's Progress     I am learning how to deal with my RA (pt-stated)        Patient Goals/Self Care Activities: -Patient/Caregiver will self-administer medications as prescribed as evidenced by self-report/primary caregiver report  -Patient/Caregiver will attend all scheduled provider appointments as evidenced by clinician review of documented attendance to scheduled appointments and patient/caregiver report -Patient/Caregiver will call pharmacy for medication refills as evidenced by patient report and review of pharmacy fill history as appropriate -Patient/Caregiver will call provider office for new concerns or questions as evidenced by review of documented incoming telephone call notes and patient report -Patient/Caregiver will focus on medication adherence by taking medications as prescribed  -Utilize cane and walker (assistive device) appropriately with ambulation  -Continue  stretches -find you a new Rheumatologist in [place] to help manage your pain        SDOH assessments and interventions completed:  No     Care Coordination Interventions:  Yes, provided   Interventions Today    Flowsheet Row Most Recent Value  Chronic Disease   Chronic disease during today's visit Other  [Rheumatologist]  General Interventions   General Interventions Discussed/Reviewed General Interventions Discussed, General Interventions Reviewed  Mental Health Interventions   Mental Health Discussed/Reviewed Mental Health Discussed  Pharmacy Interventions   Pharmacy Dicussed/Reviewed Pharmacy Topics Discussed  Safety Interventions   Safety Discussed/Reviewed Safety Discussed        Follow up plan: Follow up call scheduled for 10/18/23  10 with Natalie Edge RN    Encounter Outcome:  Patient Visit Completed   [name] RN, BSN, [facility] [place]  [facility], [facility]  Care Coordinator Phone: [phone]

## 2023-10-06 NOTE — Progress Notes (Unsigned)
    SUBJECTIVE:   CHIEF COMPLAINT: check up HPI:   Natalie [name] is a 32 y.o.  with history notable for RA and bipolar mood disorder presenting for check in.  Since our last visit the patient's husband passed away. At home she has support with her parents. Has a psychiastrist. Not interested in counseling. Reports mood is up and down but overall okay. Denies SI or HI  She has stopped all RA medications. She felt she no longer needed them. She can no longer get to [place] for appointments. Needs new rheumatologist in [place].   She is most concerned about her weight. She takes lithium and zyprexa. She reports she does not want to make her RA worse by walking.     PERTINENT  PMH / PSH/Family/Social History : RA, bipolar   OBJECTIVE:   BP 112/83   Pulse 94   Ht 5\' 4"  (1.626 m)   Wt 252 lb 12.8 oz (114.7 kg)   SpO2 97%   BMI 43.39 kg/m   Today's weight:  Last Weight  Most recent update: 10/07/2023 11:10 AM    Weight  114.7 kg (252 lb 12.8 oz)            Review of prior weights: [employer]   10/07/23 1110  Weight: 252 lb 12.8 oz (114.7 kg)  HR elevated initially--reports anxiety initially now normal   RRR Lungs clear Ambulates with walker Has xerosis of skin    ASSESSMENT/PLAN:   Assessment & Plan Bipolar 2 disorder (HCC) Monitoring labs today  Declined connecting with counseling  Rheumatoid arthritis with positive rheumatoid factor, involving unspecified site [facility]) New referral placed Encouraged her to use DMARD to prevent progression  Morbid obesity (HCC) Discussed walking will improve not worsen RA Basic strength exercises given Consider Wegovy at follow up Offered PREP at [facility] A1C and lipids today     Declined flu and COVID   At follow up in 1 month check mood and discuss QMVHQI   [name], MD  Family Medicine Teaching Service  [facility]

## 2023-10-07 ENCOUNTER — Encounter: Payer: Self-pay | Admitting: Family Medicine

## 2023-10-07 ENCOUNTER — Ambulatory Visit (INDEPENDENT_AMBULATORY_CARE_PROVIDER_SITE_OTHER): Payer: MEDICAID | Admitting: Family Medicine

## 2023-10-07 VITALS — BP 112/83 | HR 94 | Ht 64.0 in | Wt 252.8 lb

## 2023-10-07 DIAGNOSIS — M059 Rheumatoid arthritis with rheumatoid factor, unspecified: Secondary | ICD-10-CM | POA: Diagnosis not present

## 2023-10-07 DIAGNOSIS — F3181 Bipolar II disorder: Secondary | ICD-10-CM

## 2023-10-07 NOTE — Patient Instructions (Signed)
It was wonderful to see you today.  Please bring ALL of your medications with you to every visit.   Today we talked about:  - I sent in a new rheumatology referral  - I will message or call you with blood work  Think about getting your flu shot  - Follow up in 1 month to discuss weight   Thank you for choosing [facility].   Please call [phone] with any questions about today's appointment.  Please be sure to schedule follow up at the front  desk before you leave today.   [name], MD  Family Medicine

## 2023-10-07 NOTE — Assessment & Plan Note (Signed)
Monitoring labs today  Declined connecting with counseling

## 2023-10-07 NOTE — Assessment & Plan Note (Signed)
New referral placed Encouraged her to use DMARD to prevent progression

## 2023-10-07 NOTE — Assessment & Plan Note (Signed)
Discussed walking will improve not worsen RA Basic strength exercises given Consider Wegovy at follow up Offered PREP at [facility] A1C and lipids today

## 2023-10-08 ENCOUNTER — Telehealth: Payer: Self-pay | Admitting: Family Medicine

## 2023-10-08 LAB — BASIC METABOLIC PANEL
BUN/Creatinine Ratio: 5 — ABNORMAL LOW (ref 9–23)
BUN: 4 mg/dL — ABNORMAL LOW (ref 6–20)
CO2: 19 mmol/L — ABNORMAL LOW (ref 20–29)
Calcium: 9.6 mg/dL (ref 8.7–10.2)
Chloride: 106 mmol/L (ref 96–106)
Creatinine, Ser: 0.79 mg/dL (ref 0.57–1.00)
Glucose: 93 mg/dL (ref 70–99)
Potassium: 4 mmol/L (ref 3.5–5.2)
Sodium: 142 mmol/L (ref 134–144)
eGFR: 102 mL/min/{1.73_m2} (ref >59)

## 2023-10-08 LAB — CBC
Hematocrit: 40.2 % (ref 34.0–46.6)
Hemoglobin: 13.2 g/dL (ref 11.1–15.9)
MCH: 31.1 pg (ref 26.6–33.0)
MCHC: 32.8 g/dL (ref 31.5–35.7)
MCV: 95 fL (ref 79–97)
Platelets: 289 10*3/uL (ref 150–450)
RBC: 4.25 x10E6/uL (ref 3.77–5.28)
RDW: 13.7 % (ref 11.7–15.4)
WBC: 13.2 10*3/uL — ABNORMAL HIGH (ref 3.4–10.8)

## 2023-10-08 LAB — HEPATIC FUNCTION PANEL
ALT: 17 [IU]/L (ref 0–32)
AST: 21 [IU]/L (ref 0–40)
Albumin: 4.3 g/dL (ref 3.9–4.9)
Alkaline Phosphatase: 192 [IU]/L — ABNORMAL HIGH (ref 44–121)
Bilirubin Total: 0.6 mg/dL (ref 0.0–1.2)
Bilirubin, Direct: 0.17 mg/dL (ref 0.00–0.40)
Total Protein: 7.7 g/dL (ref 6.0–8.5)

## 2023-10-08 LAB — LIPID PANEL
Chol/HDL Ratio: 5.6 ratio — ABNORMAL HIGH (ref 0.0–4.4)
Cholesterol, Total: 223 mg/dL — ABNORMAL HIGH (ref 100–199)
HDL: 40 mg/dL (ref >39)
LDL Chol Calc (NIH): 153 mg/dL — ABNORMAL HIGH (ref 0–99)
Triglycerides: 164 mg/dL — ABNORMAL HIGH (ref 0–149)
VLDL Cholesterol Cal: 30 mg/dL (ref 5–40)

## 2023-10-08 LAB — VITAMIN D 25 HYDROXY (VIT D DEFICIENCY, FRACTURES): Vit D, 25-Hydroxy: <4 ng/mL — ABNORMAL LOW (ref 30.0–100.0)

## 2023-10-08 LAB — HEMOGLOBIN A1C
Est. average glucose Bld gHb Est-mCnc: 105 mg/dL
Hgb A1c MFr Bld: 5.3 % (ref 4.8–5.6)

## 2023-10-08 LAB — LITHIUM LEVEL: Lithium Lvl: 1.5 mmol/L (ref 0.5–1.2)

## 2023-10-08 MED ORDER — VITAMIN D (ERGOCALCIFEROL) 1.25 MG (50000 UNIT) PO CAPS: 50000.0000 [IU] | ORAL_CAPSULE | ORAL | 1 refills | Status: DC

## 2023-10-08 NOTE — Telephone Encounter (Signed)
Patient LVM on nurse line to return call to provider regarding results.   Attempted to call patient back. She did not answer, LVM asking that she return call.   Veronda Prude, RN

## 2023-10-08 NOTE — Telephone Encounter (Signed)
Attempted to call patient. Reached voicemail, left generic voicemail to call back.  If she calls back please let her know - Overall labs look okay - Lithium level is a bit elevated--we need to let her psychiatrist know--please ask name and address of psychiatrist   At follow up -- Discuss dietary changes for lipids - Needs weekly vitamin D (sent to pharmacy) - Will also send mychart  Natalie Starr, MD  [facility] Medicine Teaching Service

## 2023-10-11 NOTE — Telephone Encounter (Signed)
Called patient to discuss results  Discussed low D, elevated ALP---repeat with GGT at follow up  Discussed elevated lithium level--has new NP--recent dose change. This was collected >12 hours after last dose. Level 1.5.   Called [employer] and discussed above. Spoke with [employer].   [name], MD  Family Medicine Teaching Service

## 2023-10-18 ENCOUNTER — Other Ambulatory Visit: Payer: Self-pay | Admitting: *Deleted

## 2023-10-18 NOTE — Patient Instructions (Signed)
Visit Information  Thank you for taking time to visit with me today. Please don't hesitate to contact me if I can be of assistance to you before our next scheduled telephone appointment.  Following are the goals we discussed today:   Goals Addressed             This Visit's Progress    RNCM Care Management Expected Outcome: Monitor, Self-Manage and Reduce Symptoms of: Rheumatoid Arthritis, Bipolar Disorder       Current Barriers:  Knowledge Deficits related to plan of care for management of Bipolar Disorder and Rheumatoid Arthritis  Chronic Disease Management support and education needs related to Bipolar Disorder and Rheumatoid Arthritis  Financial Constraints   RNCM Clinical Goal(s):  Patient will verbalize basic understanding of  Bipolar Disorder and Rheumatoid Arthritis disease process and self health management plan as evidenced by verbal explanation, recognizing symptoms, lifestyle modifications and daily monitoring take all medications exactly as prescribed and will call provider for medication related questions as evidenced by compliance with medications attend all scheduled medical appointments: with primary care provider and all specialist as evidenced by keeping all scheduled appointments. Has PCP appointment 11-12-2023 demonstrate Improved and Ongoing adherence to prescribed treatment plan for Bipolar Disorder and Rheumatoid Arthritis  as evidenced by consistent medication compliance, symptom monitoring, continued lifestyle modifications, weight monitoring, exercise routines & education continue to work with RN Care Manager to address care management and care coordination needs related to  Bipolar Disorder and Rheumatoid Arthritis as evidenced by adherence to CM Team Scheduled appointments work with Child psychotherapist to address  related to the management of Financial constraints related to [name], Limited access to food, and Mental Health Concerns  related to the management  of Bipolar Disorder and Rheumatoid Arthritis as evidenced by review of EMR and patient or Child psychotherapist report through collaboration with [name], provider, and care team.   Interventions: Evaluation of current treatment plan related to  self management and patient's adherence to plan as established by provider     Bipolar Disorder (Status:  New goal. and Goal on track:  Yes.)  Long Term Goal Evaluation of current treatment plan related to Bipolar Disorder, Mental Health Concerns  self-management and patient's adherence to plan as established by provider. Take all medication as prescribed Schedule routine follow-ups with psychiatrist to monitor medication efficacy and side effects: Regular blood tests for Lithium to ensure therapeutic levels. Patient Lithium levels noted to be elevated and referred to psychiatry for medication management. RNCM advised patient to follow up with provider after call completion to talk about Lithium levels being elevated. Patient states she was not contacted by per provider prior to our call today. Relaxation techniques (mindfulness, meditation or yoga). Discussed plans with patient for ongoing care management follow up and provided patient with direct contact information for care management team Patient states that she is open to counseling related to the passing of her husband. RNCM provided information re: [email or web address] Referral to SW placed. RNCM provided contact information for [employer]:  [phone].   Pain Interventions R/T Rheumatoid Arthritis:  (Status:  New goal. and Goal on track:  Yes.) Long Term Goal Pain assessment performed Medications reviewed Reviewed provider established plan for pain management Discussed importance of adherence to all scheduled medical appointments Counseled on the importance of reporting any/all new or changed pain symptoms or management strategies to pain management provider Advised patient to report to care  team affect of pain on daily activities Discussed use of relaxation  techniques and/or diversional activities to assist with pain reduction (distraction, imagery, relaxation, massage, acupressure, TENS, heat, and cold application Advised patient to discuss any new changes with provider RNCM spoke in detail with patient about tailoring programs related to range of motion, strength and flexibility. Use assistive devices if necessary. Low-impact activities such as walking, water aerobics Stretching and strengthening exercises to improve joint function ex: therabands   Weight Loss Interventions:  (Status:  New goal. and Goal on track:  NO.) Long Term Goal Advised patient to discuss with primary care provider options regarding weight management Provided verbal and/or written education to patient re: provider recommended life style modifications  Screening for signs and symptoms of depression Offered to connect patient with psychology or social work support for counseling and supportive care Reviewed recommended dietary changes: avoid fad diets, make small/incremental dietary and exercise changes, eat at the table and avoid eating in front of the TV, plan management of cravings, monitor snacking and cravings in food diary  RNCM spoke to patient about lifestyle modifications related to diet. Patient expressed she gets limited about of food stamps and it is difficult for her to eat healthier. SW referral placed for food resources. Limit processed foods, sugars and excessive salt Anti-inflammatory diet rich in fruits, vegetables, whole grains and omega-3 fatty acids ex. Berries, cherries, beets, spinach, gale, broccoli, oats, brown rice, olive oil, avocados, nuts, fish(salmon), beans & lentils  Patient Goals/Self-Care Activities: Take all medications as prescribed Attend all scheduled provider appointments Call pharmacy for medication refills 3-7 days in advance of running out of medications Perform all  self care activities independently  Call provider office for new concerns or questions  Work with the social worker to address care coordination needs and will continue to work with the clinical team to address health care and disease management related needs  Follow Up Plan:  Telephone follow up appointment with care management team member scheduled for:  11-22-2023 at 10:00 am           Our next appointment is by telephone on 11-22-2023 at 10:00 am  Please call the care guide team at [phone] if you need to cancel or reschedule your appointment.   If you are experiencing a Mental Health or Behavioral Health Crisis or need someone to talk to, please call the Suicide and Crisis Lifeline: 988 call the [place] National Suicide Prevention Lifeline: (539)812-5870 or TTY: 419-052-7392 TTY (463) 628-7990) to talk to a trained counselor call 1-800-273-TALK (toll free, 24 hour hotline) go to [facility] [address], [place] [phone])   Patient verbalizes understanding of instructions and care plan provided today and agrees to view in MyChart. Active MyChart status and patient understanding of how to access instructions and care plan via MyChart confirmed with patient.     Telephone follow up appointment with care management team member scheduled for:11-22-2023 at 10:00 am  Danise Edge, BSN RN RN Care Manager  [facility]  Ambulatory Care Management  Direct Number: [phone]

## 2023-10-18 NOTE — Patient Outreach (Signed)
Care Management   Visit Note  10/18/2023 Name: Natalie [name] MRN: [id number] DOB: [birth year]  Subjective: Natalie [name] is a 32 y.o. year old female who is a primary care patient of Westley Chandler, MD. The Care Management team was consulted for assistance.      Engaged with patient spoke with patient by telephone.    Goals Addressed             This Visit's Progress    RNCM Care Management Expected Outcome: Monitor, Self-Manage and Reduce Symptoms of: Rheumatoid Arthritis, Bipolar Disorder       Current Barriers:  Knowledge Deficits related to plan of care for management of Bipolar Disorder and Rheumatoid Arthritis  Chronic Disease Management support and education needs related to Bipolar Disorder and Rheumatoid Arthritis  Financial Constraints   RNCM Clinical Goal(s):  Patient will verbalize basic understanding of  Bipolar Disorder and Rheumatoid Arthritis disease process and self health management plan as evidenced by verbal explanation, recognizing symptoms, lifestyle modifications and daily monitoring take all medications exactly as prescribed and will call provider for medication related questions as evidenced by compliance with medications attend all scheduled medical appointments: with primary care provider and all specialist as evidenced by keeping all scheduled appointments. Has PCP appointment 11-12-2023 demonstrate Improved and Ongoing adherence to prescribed treatment plan for Bipolar Disorder and Rheumatoid Arthritis  as evidenced by consistent medication compliance, symptom monitoring, continued lifestyle modifications, weight monitoring, exercise routines & education continue to work with RN Care Manager to address care management and care coordination needs related to  Bipolar Disorder and Rheumatoid Arthritis as evidenced by adherence to CM Team Scheduled appointments work with Child psychotherapist to address  related to the management of Financial constraints related  to [name], Limited access to food, and Mental Health Concerns  related to the management of Bipolar Disorder and Rheumatoid Arthritis as evidenced by review of EMR and patient or Child psychotherapist report through collaboration with [name], provider, and care team.   Interventions: Evaluation of current treatment plan related to  self management and patient's adherence to plan as established by provider     Bipolar Disorder (Status:  New goal. and Goal on track:  Yes.)  Long Term Goal Evaluation of current treatment plan related to Bipolar Disorder, Mental Health Concerns  self-management and patient's adherence to plan as established by provider. Take all medication as prescribed Schedule routine follow-ups with psychiatrist to monitor medication efficacy and side effects: Regular blood tests for Lithium to ensure therapeutic levels. Patient Lithium levels noted to be elevated and referred to psychiatry for medication management. RNCM advised patient to follow up with provider after call completion to talk about Lithium levels being elevated. Patient states she was not contacted by per provider prior to our call today. Relaxation techniques (mindfulness, meditation or yoga). Discussed plans with patient for ongoing care management follow up and provided patient with direct contact information for care management team Patient states that she is open to counseling related to the passing of her husband. RNCM provided information re: [email or web address] Referral to SW placed. RNCM provided contact information for [employer]:  [phone].   Pain Interventions R/T Rheumatoid Arthritis:  (Status:  New goal. and Goal on track:  Yes.) Long Term Goal Pain assessment performed Medications reviewed Reviewed provider established plan for pain management Discussed importance of adherence to all scheduled medical appointments Counseled on the importance of reporting any/all new or changed pain  symptoms or management strategies to pain management  provider Advised patient to report to care team affect of pain on daily activities Discussed use of relaxation techniques and/or diversional activities to assist with pain reduction (distraction, imagery, relaxation, massage, acupressure, TENS, heat, and cold application Advised patient to discuss any new changes with provider RNCM spoke in detail with patient about tailoring programs related to range of motion, strength and flexibility. Use assistive devices if necessary. Low-impact activities such as walking, water aerobics Stretching and strengthening exercises to improve joint function ex: therabands   Weight Loss Interventions:  (Status:  New goal. and Goal on track:  NO.) Long Term Goal Advised patient to discuss with primary care provider options regarding weight management Provided verbal and/or written education to patient re: provider recommended life style modifications  Screening for signs and symptoms of depression Offered to connect patient with psychology or social work support for counseling and supportive care Reviewed recommended dietary changes: avoid fad diets, make small/incremental dietary and exercise changes, eat at the table and avoid eating in front of the TV, plan management of cravings, monitor snacking and cravings in food diary  RNCM spoke to patient about lifestyle modifications related to diet. Patient expressed she gets limited about of food stamps and it is difficult for her to eat healthier. SW referral placed for food resources. Limit processed foods, sugars and excessive salt Anti-inflammatory diet rich in fruits, vegetables, whole grains and omega-3 fatty acids ex. Berries, cherries, beets, spinach, gale, broccoli, oats, brown rice, olive oil, avocados, nuts, fish(salmon), beans & lentils  Patient Goals/Self-Care Activities: Take all medications as prescribed Attend all scheduled provider  appointments Call pharmacy for medication refills 3-7 days in advance of running out of medications Perform all self care activities independently  Call provider office for new concerns or questions  Work with the social worker to address care coordination needs and will continue to work with the clinical team to address health care and disease management related needs  Follow Up Plan:  Telephone follow up appointment with care management team member scheduled for:  11-22-2023 at 10:00 am             Consent to Services:  Patient was given information about care management services, agreed to services, and gave verbal consent to participate.   Plan: Telephone follow up appointment with care management team member scheduled for:11-22-2023 at 10:00 am  Danise Edge, BSN RN RN Care Manager  [facility]  Ambulatory Care Management  Direct Number: [phone]

## 2023-10-22 ENCOUNTER — Ambulatory Visit: Payer: Self-pay | Admitting: Licensed Clinical Social Worker

## 2023-10-22 ENCOUNTER — Telehealth: Payer: Self-pay | Admitting: Family Medicine

## 2023-10-22 NOTE — Patient Instructions (Signed)
Visit Information  Thank you for taking time to visit with me today. Please don't hesitate to contact me if I can be of assistance to you.   Following are the goals we discussed today:   Goals Addressed             This Visit's Progress    Care Coordination Activities       Care Coordination Interventions: Patient stated that she is receiving $190 in food stamps per month but needs more fresh vegetables, she stated that she does not like the food pantries that she has used in the past because some of them don't have fresh food. The SW educated the patient on the [employer] App and will send an updated list of food pantry's via email to [email or web address] Patient stated that she is also interested in becoming a member at the [facility] but can afford it, the SW discussed that the [facility] does have scholarship and sliding scale fee assistance to help individuals who can't afford to pay the full membership. The SW will also email this information to the patient [email or web address] Patient stated that her mother is her payee and pays all of the patients bills with her disability check each month. [name] stated that she just recently lost her husband and wants to talk to someone, the Sw will make a referral to have a LCSW to call. SW discussed the SDOH and no other needs or concerns  The SW will follow up with the patient on 11/05/2023 at 2:15 pm        Our next appointment is by telephone on 11/05/2023 at 2:15 pm  Please call the care guide team at [phone] if you need to cancel or reschedule your appointment.   If you are experiencing a Mental Health or Behavioral Health Crisis or need someone to talk to, please call the Suicide and Crisis Lifeline: 988 go to [facility] Urgent [facility] [address] [place] [phone]) call 911  Patient verbalizes understanding of instructions and care plan provided today and agrees to view in MyChart. Active MyChart  status and patient understanding of how to access instructions and care plan via MyChart confirmed with patient.     Jeanie Cooks, PhD [facility], [facility] Social Worker Direct Dial: [phone]  Fax: [phone]

## 2023-10-22 NOTE — Patient Outreach (Signed)
  Care Coordination   Initial Visit Note   10/22/2023 Name: Natalie [name] MRN: [id number] DOB: [birth year]  Natalie [name] is a 32 y.o. year old female who sees Westley Chandler, MD for primary care. I spoke with  [name] by phone today.  What matters to the patients health and wellness today?  Food Insecurities and [employer] membership    Goals Addressed             This Visit's Progress    Care Coordination Activities       Care Coordination Interventions: Patient stated that she is receiving $190 in food stamps per month but needs more fresh vegetables, she stated that she does not like the food pantries that she has used in the past because some of them don't have fresh food. The SW educated the patient on the [employer] App and will send an updated list of food pantry's via email to [email or web address] Patient stated that she is also interested in becoming a member at the [facility] but can afford it, the SW discussed that the [facility] does have scholarship and sliding scale fee assistance to help individuals who can't afford to pay the full membership. The SW will also email this information to the patient [email or web address] Patient stated that her mother is her payee and pays all of the patients bills with her disability check each month. [name] stated that she just recently lost her husband and wants to talk to someone, the Sw will make a referral to have a LCSW to call. SW discussed the SDOH and no other needs or concerns  The SW will follow up with the patient on 11/05/2023 at 2:15 pm        SDOH assessments and interventions completed:  Yes  SDOH Interventions Today    Flowsheet Row Most Recent Value  SDOH Interventions   Food Insecurity Interventions Other (Comment)  [Patient stated that she runs low and the SW will email her some resources]  Housing Interventions Intervention Not Indicated  Transportation Interventions Intervention Not Indicated, SCAT  ([employer])  Patent examiner was approved for [employer]]  Utilities Interventions Intervention Not Indicated  Financial Strain Interventions Intervention Not Indicated  [Patient stated that her mother is her payee and pays all her bills]        Care Coordination Interventions:  Yes, provided  Interventions Today    Flowsheet Row Most Recent Value  General Interventions   General Interventions Discussed/Reviewed General Interventions Discussed, [employer] needs more food assistance and information about reduced membership to the [employer]]        Follow up plan: Follow up call scheduled for 11/05/2023 at 2:15 pm    Encounter Outcome:  Patient Visit Completed  Jeanie Cooks, PhD [facility], [facility] Social Worker Direct Dial: [phone]  Fax: [phone]

## 2023-10-22 NOTE — Telephone Encounter (Signed)
Called and discussed. Lithium dose now 300 mg.  [name], MD  Family Medicine Teaching Service

## 2023-10-22 NOTE — Telephone Encounter (Signed)
Patient calling regarding her lithium levels. She states she would like to discuss these levels with Dr Manson Passey and see what Dr Manson Passey thinks about them. Please call patient when you get a chance.

## 2023-10-27 ENCOUNTER — Encounter: Payer: MEDICAID | Admitting: Licensed Clinical Social Worker

## 2023-10-27 ENCOUNTER — Encounter: Payer: MEDICAID | Admitting: *Deleted

## 2023-11-01 ENCOUNTER — Telehealth: Payer: Self-pay | Admitting: Family Medicine

## 2023-11-01 NOTE — Telephone Encounter (Signed)
Patient informed. Penni Bombard CMA

## 2023-11-01 NOTE — Telephone Encounter (Signed)
Yes- Midol would be fine.   Please let her know we will also discuss 12/13

## 2023-11-01 NOTE — Telephone Encounter (Signed)
Spoke with patient she reports her menstrual cycle is giving her cramps and as she gets older she is aware the cramps can worsen. She is currently taking Tylenol for the pain. Patient is asking if it is okay to take Midol during the day instead of Tylenol while being on other medications. Penni Bombard CMA

## 2023-11-01 NOTE — Telephone Encounter (Signed)
Red team--please call patient and find out more details. If she is acutely concerned, can be seen sooner than 12/13.  [name], MD  Family Medicine Teaching Service

## 2023-11-01 NOTE — Telephone Encounter (Signed)
Patient is calling and would like for Dr. Manson Passey to call her concerning her menstrual cycle. I informed patient she would most likely need an appointment but she states she is not able to make one.   Please call patient to discuss. The best call back is [phone].

## 2023-11-03 ENCOUNTER — Ambulatory Visit: Payer: Self-pay | Admitting: *Deleted

## 2023-11-03 NOTE — Patient Outreach (Incomplete)
  Care Coordination   Initial Visit Note   11/04/2023 late entry Name: Natalie [name] MRN: [id number] DOB: Feb 07, 1991  Natalie [name] is a 32 y.o. year old female who sees Westley Chandler, MD for primary care. I spoke with  Natalie [name] by phone on 11/03/23..  What matters to the patients health and wellness today?  Patient discussed recent loss of her spouse which continues to be difficult but she is managing with the support of her parents and close friends. Patient further confirms having a strong spiritual life which helps to keep her grounded.   Patient denies need to re-visit grief counseling again ,  would like to begin a exercise program, contact information provided to [facility] [phone] to confirm gym membership coverage,    Goals Addressed             This Visit's Progress    Community resources-grief support       Activities and task to complete in order to accomplish goals.   EMOTIONAL / MENTAL HEALTH SUPPORT Keep all upcoming appointment discussed today Self Support options  (continue self care strategies discussed today, reaching out to family close friends for continued support) Please contact [employer] to follow up on coverage for gym membership            SDOH assessments and interventions completed:  Yes  SDOH Interventions Today    Flowsheet Row Most Recent Value  SDOH Interventions   Food Insecurity Interventions --  [parents provided food resources]  Housing Interventions Intervention Not Indicated  Transportation Interventions Intervention Not Indicated, SCAT ([employer])        Care Coordination Interventions:  Yes, provided  Interventions Today    Flowsheet Row Most Recent Value  Chronic Disease   Chronic disease during today's visit Other  [grief and loss]  General Interventions   General Interventions Discussed/Reviewed General Interventions Discussed, Community Resources  [needs assessment foir  support needs completed-confirmed that patient's spouse passed away suddenly on 2023/08/01]  Exercise Interventions   Exercise Discussed/Reviewed Exercise Discussed  Education Interventions   Education Provided Provided Education  Provided Verbal Education On [employer]  [[employer]-gym membership coverage [phone] agreeable to contacting insurance plan to confirm coverage]  Mental Health Interventions   Mental Health Discussed/Reviewed Mental Health Discussed, Coping Strategies, Grief and Loss  [emotional support provided, grief response normalized, pt confirms hx of grief counseling, declines further f/u confirms having strong family support and spiritual life, pt able to verbalize positive coping strategies to manage grief symptoms]  Nutrition Interventions   Nutrition Discussed/Reviewed Nutrition Discussed  [receives food stamps 190.00 per month]  Pharmacy Interventions   Pharmacy Dicussed/Reviewed Pharmacy Topics Discussed  [confirms medications managed by Dr. Aggie Cosier [facility]-recently received 3 month supply]  Safety Interventions   Safety Discussed/Reviewed Safety Discussed  [pt declines thoughts of harm to self and others, agrees to contact 988 in the event of a mental health crisis]       Follow up plan: Follow up call scheduled for 11/17/23    Encounter Outcome:  Patient Visit Completed

## 2023-11-04 NOTE — Patient Instructions (Signed)
Visit Information  Thank you for taking time to visit with me today. Please don't hesitate to contact me if I can be of assistance to you.   Following are the goals we discussed today:   Goals Addressed             This Visit's Progress    Community resources-grief support       Activities and task to complete in order to accomplish goals.   EMOTIONAL / MENTAL HEALTH SUPPORT Keep all upcoming appointment discussed today Self Support options  (continue self care strategies discussed today, reaching out to family close friends for continued support) Please contact [employer] to follow up on coverage for gym membership            Our next appointment is by telephone on 11/17/23 at 1pm  Please call the care guide team at [phone] if you need to cancel or reschedule your appointment.   If you are experiencing a Mental Health or Behavioral Health Crisis or need someone to talk to, please call the Suicide and Crisis Lifeline: 988   Patient verbalizes understanding of instructions and care plan provided today and agrees to view in MyChart. Active MyChart status and patient understanding of how to access instructions and care plan via MyChart confirmed with patient.     Telephone follow up appointment with care management team member scheduled for: 11/17/23 Verna Czech, LCSW [facility]  [facility], [facility] Licensed Clinical Social Worker Care Coordinator  Direct Dial: [phone]

## 2023-11-05 ENCOUNTER — Encounter: Payer: MEDICAID | Admitting: Licensed Clinical Social Worker

## 2023-11-08 ENCOUNTER — Ambulatory Visit: Payer: Self-pay | Admitting: Licensed Clinical Social Worker

## 2023-11-08 NOTE — Patient Outreach (Signed)
  Care Coordination   Follow Up Visit Note   11/08/2023 Name: Natalie [name] MRN: [id number] DOB: 11-02-1991  Natalie [name] is a 32 y.o. year old female who sees Natalie Chandler, MD for primary care. I spoke with  Natalie [name] by phone today.  What matters to the patients health and wellness today?  Resources Mailed and Gym Membership Patient stated that she will be calling [employer] in regards to her gym membership that the LCSW discussed with her. Patient stated that she can handle everything from here and does not need so many SW and RN's calling. SW explained each role to patient.   Goals Addressed             This Visit's Progress    COMPLETED: Care Coordination Activities       Care Coordination Interventions: Patient stated that she is receiving $190 in food stamps per month but needs more fresh vegetables, she stated that she does not like the food pantries that she has used in the past because some of them don't have fresh food. The SW educated the patient on the [employer] App and will send an updated list of food pantry's via email to [email or web address] Patient stated that she is also interested in becoming a member at the [facility] but can afford it, the SW discussed that the [facility] does have scholarship and sliding scale fee assistance to help individuals who can't afford to pay the full membership. The SW will also email this information to the patient [email or web address] Patient stated that her mother is her payee and pays all of the patients bills with her disability check each month. [name] stated that she just recently lost her husband and wants to talk to someone, the Sw will make a referral to have a LCSW to call. SW discussed the SDOH and no other needs or concerns  The SW will follow up with the patient on 11/05/2023 at 2:15 pm        SDOH assessments and interventions completed:  Yes  SDOH Interventions Today    Flowsheet Row Most Recent Value   SDOH Interventions   Food Insecurity Interventions Intervention Not Indicated  Utilities Interventions Intervention Not Indicated        Care Coordination Interventions:  Yes, provided  Interventions Today    Flowsheet Row Most Recent Value  General Interventions   General Interventions Discussed/Reviewed General Interventions Reviewed  [Patient stated that she will be calling [employer] in regards to her gym membership that the LCSW discussed with her. Patient stated that she can handle everything from here and does not need so many SW and RN's calling. SW explained each role to patient]        Follow up plan: No further intervention required.   Encounter Outcome:  Patient Visit Completed   Jeanie Cooks, PhD [facility], [facility] Social Worker Direct Dial: [phone]  Fax: [phone]

## 2023-11-08 NOTE — Patient Instructions (Signed)
Visit Information  Thank you for taking time to visit with me today. Please don't hesitate to contact me if I can be of assistance to you.   Following are the goals we discussed today:   Goals Addressed             This Visit's Progress    COMPLETED: Care Coordination Activities       Care Coordination Interventions: Patient stated that she is receiving $190 in food stamps per month but needs more fresh vegetables, she stated that she does not like the food pantries that she has used in the past because some of them don't have fresh food. The SW educated the patient on the [employer] App and will send an updated list of food pantry's via email to [email or web address] Patient stated that she is also interested in becoming a member at the [facility] but can afford it, the SW discussed that the [facility] does have scholarship and sliding scale fee assistance to help individuals who can't afford to pay the full membership. The SW will also email this information to the patient [email or web address] Patient stated that her mother is her payee and pays all of the patients bills with her disability check each month. [name] stated that she just recently lost her husband and wants to talk to someone, the Sw will make a referral to have a LCSW to call. SW discussed the SDOH and no other needs or concerns  The SW will follow up with the patient on 11/05/2023 at 2:15 pm        No Further Follow up needed per patient request   Please call the care guide team at [phone] if you need to cancel or reschedule your appointment.   If you are experiencing a Mental Health or Behavioral Health Crisis or need someone to talk to, please call the Suicide and Crisis Lifeline: 988 go to [facility] Urgent [facility] [address], [place] [phone]) call 911  Patient verbalizes understanding of instructions and care plan provided today and agrees to view in MyChart. Active MyChart  status and patient understanding of how to access instructions and care plan via MyChart confirmed with patient.     Jeanie Cooks, PhD [facility], [facility] Social Worker Direct Dial: [phone]  Fax: [phone]

## 2023-11-12 ENCOUNTER — Ambulatory Visit: Payer: MEDICAID | Admitting: Family Medicine

## 2023-11-17 ENCOUNTER — Ambulatory Visit: Payer: Self-pay | Admitting: *Deleted

## 2023-11-17 NOTE — Patient Instructions (Signed)
Visit Information  Thank you for taking time to visit with me today. Please don't hesitate to contact me if I can be of assistance to you.   Following are the goals we discussed today:   Goals Addressed             This Visit's Progress    COMPLETED: Community resources-grief support       Activities and task to complete in order to accomplish goals.   EMOTIONAL / MENTAL HEALTH SUPPORT Keep all upcoming appointment discussed today Self Support options  (continue to utilize self care strategies discussed today including reaching out to family close friends for continued support) Please follow up with your Psychiatrist regarding documentation for an emotional support animal Please contact [employer] to follow up on coverage for gym membership for February 2025           If you are experiencing a Mental Health or Behavioral Health Crisis or need someone to talk to, please call the Suicide and Crisis Lifeline: 988   Patient verbalizes understanding of instructions and care plan provided today and agrees to view in MyChart. Active MyChart status and patient understanding of how to access instructions and care plan via MyChart confirmed with patient.     No further follow up required: patient to contact this Child psychotherapist with any additional community resource needs  [employer] [place]  [facility], [facility] Licensed Clinical Social Geologist, engineering Dial: [phone]

## 2023-11-17 NOTE — Patient Outreach (Signed)
  Care Coordination   Follow Up Visit Note   11/17/2023 Name: Natalie [name] MRN: [id number] DOB: November 08, 1991  Natalie [name] is a 32 y.o. year old female who sees Westley Chandler, MD for primary care. I spoke with  [name] by phone today.  What matters to the patients health and wellness today?  Patient continues to decline need for grief counseling. Reports that she has continued interest in a gym membership and has contacted her insurance to confirm coverage that will start in February 2025. Patient request an emotional support animal and is agreeable to discussing documentation needed during her next appointment with the Psychiatrist scheduled for 12/07/23.    Goals Addressed             This Visit's Progress    COMPLETED: Community resources-grief support       Activities and task to complete in order to accomplish goals.   EMOTIONAL / MENTAL HEALTH SUPPORT Keep all upcoming appointment discussed today Self Support options  (continue to utilize self care strategies discussed today including reaching out to family close friends for continued support) Please follow up with your Psychiatrist regarding documentation for an emotional support animal Please contact [employer] to follow up on coverage for gym membership for February 2025            SDOH assessments and interventions completed:  No     Care Coordination Interventions:  Yes, provided  Interventions Today    Flowsheet Row Most Recent Value  Chronic Disease   Chronic disease during today's visit Other  [grief and loss]  General Interventions   General Interventions Discussed/Reviewed General Interventions Reviewed, [facility], Doctor Visits  [confirmed that patient contacted her insurance company to confirm that she does have a benefit that covers gym memberships, however will be available 01/2024]  Doctor Visits Discussed/Reviewed Specialist  [follow up wiht psychiatrist scheduled for 12/07/23]   Mental Health Interventions   Mental Health Discussed/Reviewed Mental Health Reviewed, Coping Strategies, Grief and Loss  [MH needs reviewed, pt continues decline need for grief counseling confirming having a supportive family network that helps her to cope with loss. Pt would like a emotional support animal, encouraged to discuss during next appt with Psychiatrist]       Follow up plan: No further intervention required.   Encounter Outcome:  Patient Visit Completed

## 2023-11-22 ENCOUNTER — Other Ambulatory Visit: Payer: Self-pay | Admitting: *Deleted

## 2023-11-22 NOTE — Patient Outreach (Signed)
Care Management   Visit Note  11/22/2023 Name: Natalie [name] MRN: [id number] DOB: 01-15-1991  Subjective: Natalie [name] is a 32 y.o. year old female who is a primary care patient of Westley Chandler, MD. The Care Management team was consulted for assistance.      Engaged with patient spoke with patient by telephone.    Goals Addressed             This Visit's Progress    RNCM Care Management Expected Outcome: Monitor, Self-Manage and Reduce Symptoms of: Rheumatoid Arthritis, Bipolar Disorder       Current Barriers:  Knowledge Deficits related to plan of care for management of Bipolar Disorder and Rheumatoid Arthritis  Chronic Disease Management support and education needs related to Bipolar Disorder and Rheumatoid Arthritis  Financial Constraints   RNCM Clinical Goal(s):  Patient will verbalize basic understanding of  Bipolar Disorder and Rheumatoid Arthritis disease process and self health management plan as evidenced by verbal explanation, recognizing symptoms, lifestyle modifications and daily monitoring take all medications exactly as prescribed and will call provider for medication related questions as evidenced by compliance with medications attend all scheduled medical appointments: with primary care provider and all specialist as evidenced by keeping all scheduled appointments. Has PCP appointment 12-10-2023 demonstrate Improved and Ongoing adherence to prescribed treatment plan for Bipolar Disorder and Rheumatoid Arthritis  as evidenced by consistent medication compliance, symptom monitoring, continued lifestyle modifications, weight monitoring, exercise routines & education continue to work with RN Care Manager to address care management and care coordination needs related to  Bipolar Disorder and Rheumatoid Arthritis as evidenced by adherence to CM Team Scheduled appointments work with Child psychotherapist to address  related to the management of Financial constraints related  to [name], Limited access to food, and Mental Health Concerns  related to the management of Bipolar Disorder and Rheumatoid Arthritis as evidenced by review of EMR and patient or Child psychotherapist report through collaboration with [name], provider, and care team.   Interventions: Evaluation of current treatment plan related to  self management and patient's adherence to plan as established by provider     Bipolar Disorder (Status:  Goal on track:  Yes.)  Long Term Goal Evaluation of current treatment plan related to Bipolar Disorder, Mental Health Concerns  self-management and patient's adherence to plan as established by provider. Take all medication as prescribed Schedule routine follow-ups with psychiatrist to monitor medication efficacy and side effects: Regular blood tests for Lithium to ensure therapeutic levels. Scheduled for Lithium check on 12-10-2023 Relaxation techniques (mindfulness, meditation or yoga). Discussed plans with patient for ongoing care management follow up and provided patient with direct contact information for care management team Being followed by psychiatry, Dr. Aggie Cosier, and has follow up appointment on 12-07-2023 per patient. RNCM conference called with [name]:  [phone]  & [name]: [phone]. Per Crystal at [name] she will send information out to the [name] team with expected turn around time of approx 1 week. RNCM to follow up in 3 weeks to ensure smooth transition.   Pain Interventions R/T Rheumatoid Arthritis:  (Status:  Goal on track:  Yes.) Long Term Goal Pain assessment performed Medications reviewed Reviewed provider established plan for pain management Discussed importance of adherence to all scheduled medical appointments Counseled on the importance of reporting any/all new or changed pain symptoms or management strategies to pain management provider Advised patient to report to care team affect of pain on daily  activities Discussed use of relaxation  techniques and/or diversional activities to assist with pain reduction (distraction, imagery, relaxation, massage, acupressure, TENS, heat, and cold application Advised patient to discuss any new changes with provider RNCM spoke in detail with patient about tailoring programs related to range of motion, strength and flexibility. Use assistive devices if necessary. Low-impact activities such as walking, water aerobics Stretching and strengthening exercises to improve joint function ex: therabands   Weight Loss Interventions:  (Status:  Goal on track:  NO.) Long Term Goal Advised patient to discuss with primary care provider options regarding weight management Provided verbal and/or written education to patient re: provider recommended life style modifications  Screening for signs and symptoms of depression Offered to connect patient with psychology or social work support for counseling and supportive care Reviewed recommended dietary changes: avoid fad diets, make small/incremental dietary and exercise changes, eat at the table and avoid eating in front of the TV, plan management of cravings, monitor snacking and cravings in food diary  RNCM spoke to patient about lifestyle modifications related to diet. Patient expressed she gets limited about of food stamps and it is difficult for her to eat healthier. SW referral placed for food resources. Limit processed foods, sugars and excessive salt Anti-inflammatory diet rich in fruits, vegetables, whole grains and omega-3 fatty acids ex. Berries, cherries, beets, spinach, gale, broccoli, oats, brown rice, olive oil, avocados, nuts, fish(salmon), beans & lentils  Patient Goals/Self-Care Activities: Take all medications as prescribed Attend all scheduled provider appointments Call pharmacy for medication refills 3-7 days in advance of running out of medications Perform all self care activities independently  Call  provider office for new concerns or questions  Work with the social worker to address care coordination needs and will continue to work with the clinical team to address health care and disease management related needs  Follow Up Plan:  Telephone follow up appointment with care management team member scheduled for:  12-14-2023 at 9:00 am             Consent to Services:  Patient was given information about care management services, agreed to services, and gave verbal consent to participate.   Plan: Telephone follow up appointment with care management team member scheduled for:12-14-2023 at 9:00 am  Danise Edge, BSN RN RN Care Manager  [facility]  Ambulatory Care Management  Direct Number: [phone]

## 2023-11-22 NOTE — Patient Instructions (Signed)
Visit Information  Thank you for taking time to visit with me today. Please don't hesitate to contact me if I can be of assistance to you before our next scheduled telephone appointment.  Following are the goals we discussed today:   Goals Addressed             This Visit's Progress    RNCM Care Management Expected Outcome: Monitor, Self-Manage and Reduce Symptoms of: Rheumatoid Arthritis, Bipolar Disorder       Current Barriers:  Knowledge Deficits related to plan of care for management of Bipolar Disorder and Rheumatoid Arthritis  Chronic Disease Management support and education needs related to Bipolar Disorder and Rheumatoid Arthritis  Financial Constraints   RNCM Clinical Goal(s):  Patient will verbalize basic understanding of  Bipolar Disorder and Rheumatoid Arthritis disease process and self health management plan as evidenced by verbal explanation, recognizing symptoms, lifestyle modifications and daily monitoring take all medications exactly as prescribed and will call provider for medication related questions as evidenced by compliance with medications attend all scheduled medical appointments: with primary care provider and all specialist as evidenced by keeping all scheduled appointments. Has PCP appointment 12-10-2023 demonstrate Improved and Ongoing adherence to prescribed treatment plan for Bipolar Disorder and Rheumatoid Arthritis  as evidenced by consistent medication compliance, symptom monitoring, continued lifestyle modifications, weight monitoring, exercise routines & education continue to work with RN Care Manager to address care management and care coordination needs related to  Bipolar Disorder and Rheumatoid Arthritis as evidenced by adherence to CM Team Scheduled appointments work with Child psychotherapist to address  related to the management of Financial constraints related to [name], Limited access to food, and Mental Health Concerns  related to the management of  Bipolar Disorder and Rheumatoid Arthritis as evidenced by review of EMR and patient or Child psychotherapist report through collaboration with [name], provider, and care team.   Interventions: Evaluation of current treatment plan related to  self management and patient's adherence to plan as established by provider     Bipolar Disorder (Status:  Goal on track:  Yes.)  Long Term Goal Evaluation of current treatment plan related to Bipolar Disorder, Mental Health Concerns  self-management and patient's adherence to plan as established by provider. Take all medication as prescribed Schedule routine follow-ups with psychiatrist to monitor medication efficacy and side effects: Regular blood tests for Lithium to ensure therapeutic levels. Scheduled for Lithium check on 12-10-2023 Relaxation techniques (mindfulness, meditation or yoga). Discussed plans with patient for ongoing care management follow up and provided patient with direct contact information for care management team Being followed by psychiatry, Dr. Aggie Cosier, and has follow up appointment on 12-07-2023 per patient. RNCM conference called with [facility]:  [phone]  & [name]: [phone]. Per Crystal at [facility] she will send information out to the [facility] team with expected turn around time of approx 1 week. RNCM to follow up in 3 weeks to ensure smooth transition.   Pain Interventions R/T Rheumatoid Arthritis:  (Status:  Goal on track:  Yes.) Long Term Goal Pain assessment performed Medications reviewed Reviewed provider established plan for pain management Discussed importance of adherence to all scheduled medical appointments Counseled on the importance of reporting any/all new or changed pain symptoms or management strategies to pain management provider Advised patient to report to care team affect of pain on daily activities Discussed use of relaxation techniques and/or diversional activities to assist with pain reduction  (distraction, imagery, relaxation, massage, acupressure, TENS, heat, and cold  application Advised patient to discuss any new changes with provider RNCM spoke in detail with patient about tailoring programs related to range of motion, strength and flexibility. Use assistive devices if necessary. Low-impact activities such as walking, water aerobics Stretching and strengthening exercises to improve joint function ex: therabands   Weight Loss Interventions:  (Status:  Goal on track:  NO.) Long Term Goal Advised patient to discuss with primary care provider options regarding weight management Provided verbal and/or written education to patient re: provider recommended life style modifications  Screening for signs and symptoms of depression Offered to connect patient with psychology or social work support for counseling and supportive care Reviewed recommended dietary changes: avoid fad diets, make small/incremental dietary and exercise changes, eat at the table and avoid eating in front of the TV, plan management of cravings, monitor snacking and cravings in food diary  RNCM spoke to patient about lifestyle modifications related to diet. Patient expressed she gets limited about of food stamps and it is difficult for her to eat healthier. SW referral placed for food resources. Limit processed foods, sugars and excessive salt Anti-inflammatory diet rich in fruits, vegetables, whole grains and omega-3 fatty acids ex. Berries, cherries, beets, spinach, gale, broccoli, oats, brown rice, olive oil, avocados, nuts, fish(salmon), beans & lentils  Patient Goals/Self-Care Activities: Take all medications as prescribed Attend all scheduled provider appointments Call pharmacy for medication refills 3-7 days in advance of running out of medications Perform all self care activities independently  Call provider office for new concerns or questions  Work with the social worker to address care coordination needs  and will continue to work with the clinical team to address health care and disease management related needs  Follow Up Plan:  Telephone follow up appointment with care management team member scheduled for:  12-14-2023 at 9:00 am           Our next appointment is by telephone on 12-14-2023 at 9:00 am  Please call the care guide team at [phone] if you need to cancel or reschedule your appointment.   If you are experiencing a Mental Health or Behavioral Health Crisis or need someone to talk to, please call the Suicide and Crisis Lifeline: 988 call the [place] National Suicide Prevention Lifeline: 4586150765 or TTY: 5790151792 TTY 9498585781) to talk to a trained counselor call 1-800-273-TALK (toll free, 24 hour hotline) go to [facility] [address], [place] [phone])   Patient verbalizes understanding of instructions and care plan provided today and agrees to view in MyChart. Active MyChart status and patient understanding of how to access instructions and care plan via MyChart confirmed with patient.     Telephone follow up appointment with care management team member scheduled for:12-14-2023 at 9:00 am  Danise Edge, BSN RN RN Care Manager  [facility]  Ambulatory Care Management  Direct Number: [phone]

## 2023-12-09 NOTE — Assessment & Plan Note (Signed)
 Number given to new rheumatologist--will ask CCM RN to help

## 2023-12-09 NOTE — Patient Instructions (Addendum)
 It was wonderful to see you today.  Please bring ALL of your medications with you to every visit.   Today we talked about:  Zyprexa --I sent in 10 mg instead   We will check your blood work today  I will message you with results  You will be messaged about  Rheumatology  Address: [address], [place], [place] [address] Hours:  Open ? Closes 4:30?PM Phone: [phone]  You will be called by Cardiology  I have referred you to Cardiology  to further evaluate your concern. If you do not received a phone call about this appointment within 3-4 weeks, please call our office back at [phone]. Margit Dimes coordinates our referrals and can assist you in this.    Please follow up in 28month   Thank you for choosing [facility].   Please call [phone] with any questions about today's appointment.  Please be sure to schedule follow up at the front  desk before you leave today.   Suzann Daring, MD  Family Medicine

## 2023-12-09 NOTE — Progress Notes (Addendum)
    SUBJECTIVE:   CHIEF COMPLAINT: repeat labs HPI:   Natalie [name] is a 33 y.o.  with history notable for bipolar disorder, obesity, and RA not on therapy currently  presenting for follow up.   The patient is here today for lithium  level.  Her lithium  dose was reduced from 900 to 300.  She feels well today.  She reports her mood is okay.  Denies overdose on her medications.  Denies thoughts of hurting self or others.  She greatly misses her husband who recently passed away.  She does not have a therapist and is not interested in seeing 1.  The patient is lost nearly 30 pounds since August.  She had progressive weight gain over the last 2 years.  She is not trying to lose weight she denies fevers night sweats nausea vomiting melena hematochezia.  She believes this is related to her low mood.  She chronically thinks about her husband and often does not eat when she is in depressed state  The patient has not yet connected with a new rheumatologist.  A new number was given to her today.  She has been using her Enbrel  SureClick and feels her rheumatoid arthritis is somewhat controlled  PERTINENT  PMH / PSH/Family/Social History : Bipolar   OBJECTIVE:   BP 122/68   Pulse (!) 137   Ht 5' 4 (1.626 m)   Wt 246 lb 6.4 oz (111.8 kg)   SpO2 96%   BMI 42.29 kg/m   Today's weight:  Last Weight  Most recent update: 12/10/2023 10:30 AM    Weight  111.8 kg (246 lb 6.4 oz)            Review of prior weights: Filed Weights   12/10/23 1030  Weight: 246 lb 6.4 oz (111.8 kg)     Cardiac: Tachycardic but regular rhythm. Normal S1/S2. No murmurs, rubs, or gallops appreciated. Lungs: Clear bilaterally to ascultation.  Abdomen: Normoactive bowel sounds. No tenderness to deep or light palpation. No rebound or guarding.   Psych: Pleasant and appropriate    ASSESSMENT/PLAN:   Assessment & Plan Lithium  use Obtain level today. Rheumatoid arthritis with positive rheumatoid factor, involving  unspecified site [facility]) Number given to new rheumatologist--will ask CCM RN to help  Elevated alkaline phosphatase level Repeat today with hepatitis panel  Unintentional weight change TSH, CBC today Likey due to low mood  Tachycardia With markedly prolonged QT.  I called cardiology (Dr. Michele)  to discuss.  I also called her psychiatrist to discuss her medications.  Discussed with Dr. Michele. Recommended labs and close follow up in the outpatient setting.   They recommended a metabolic panel as well as magnesium  which been ordered.  I am repeating her lithium  level as above.  She is to follow-up Monday for an EKG.  I referred her to cardiology and scheduled an echocardiogram given her prolonged tachycardia.  Will get TSH and CBC today as well.  Discussed with the patient at length that she has to go to the ER if she has had symptoms over the weekend.  She has had no syncope and is entirely asymptomatic today. Discussed with Camelia Mountain NP and reduced her olanzapine  from 20 mg to 10 mg  Cardiology has recommended that if her QT continues to be prolonged she follow-up with EP   Suzann Daring, MD  Family Medicine Teaching Service  [facility]

## 2023-12-10 ENCOUNTER — Ambulatory Visit (INDEPENDENT_AMBULATORY_CARE_PROVIDER_SITE_OTHER): Payer: MEDICAID | Admitting: Family Medicine

## 2023-12-10 ENCOUNTER — Encounter: Payer: Self-pay | Admitting: Family Medicine

## 2023-12-10 ENCOUNTER — Telehealth: Payer: Self-pay | Admitting: Cardiology

## 2023-12-10 VITALS — BP 122/68 | HR 137 | Ht 64.0 in | Wt 246.4 lb

## 2023-12-10 DIAGNOSIS — M059 Rheumatoid arthritis with rheumatoid factor, unspecified: Secondary | ICD-10-CM

## 2023-12-10 DIAGNOSIS — R000 Tachycardia, unspecified: Secondary | ICD-10-CM

## 2023-12-10 DIAGNOSIS — F3181 Bipolar II disorder: Secondary | ICD-10-CM

## 2023-12-10 DIAGNOSIS — R9431 Abnormal electrocardiogram [ECG] [EKG]: Secondary | ICD-10-CM

## 2023-12-10 DIAGNOSIS — Z79899 Other long term (current) drug therapy: Secondary | ICD-10-CM

## 2023-12-10 DIAGNOSIS — R748 Abnormal levels of other serum enzymes: Secondary | ICD-10-CM

## 2023-12-10 DIAGNOSIS — R6889 Other general symptoms and signs: Secondary | ICD-10-CM | POA: Diagnosis not present

## 2023-12-10 MED ORDER — OLANZAPINE 10 MG PO TABS: 10.0000 mg | ORAL_TABLET | Freq: Every day | ORAL | 0 refills | Status: AC

## 2023-12-10 NOTE — Assessment & Plan Note (Signed)
 Obtain level today.

## 2023-12-10 NOTE — Telephone Encounter (Signed)
 ON-CALL CARDIOLOGY 12/10/23  Patient's name: Natalie [name].   MRN: [id number].    DOB: 1991/04/13 Primary care provider: [name], MD.  Interaction regarding this patient's care today: PCP reached out to on-call cardiology to review EKG that was performed in the clinic.    Impression: Prolonged QT. Currently on psychotropic medications-high risk medications. Bipolar mood disorder  Recommendations: EKG independently reviewed which illustrates sinus rhythm with prolonged QT. She has had elevated levels of lithium  in the past.  Since then the dose of lithium  has been reduced.  PCP is rechecking lithium  levels. Today Zyprexa  has also been reduced from 20 mg to 10 mg p.o. daily. Repeating labs including electrolytes PCP reached out to  psychiatry with these findings and medication changes were carried out. Recommended repeating an EKG on Monday 12/13/2023 with PCP.  If QT levels are still prolonged recommend EP consult for further evaluation and management. I was informed that she has not had any cardiac symptoms, no near-syncope, or syncopal events. She has been under a lot of stress in addition to her psychiatric conditions, no concerns for overdose. Reemphasized importance of reducing medications that can prolong her QT in the long run.  Medication profile needs to be checked prior to initiating therapy.  Telephone encounter total time: 8 minutes.   Madonna Large, DO, [facility]  [facility]  [address] [place], [place] [address] 12/10/2023 12:10 PM

## 2023-12-11 LAB — COMPREHENSIVE METABOLIC PANEL
ALT: 15 [IU]/L (ref 0–32)
AST: 20 [IU]/L (ref 0–40)
Albumin: 4.3 g/dL (ref 3.9–4.9)
Alkaline Phosphatase: 168 [IU]/L — ABNORMAL HIGH (ref 44–121)
BUN/Creatinine Ratio: 7 — ABNORMAL LOW (ref 9–23)
BUN: 5 mg/dL — ABNORMAL LOW (ref 6–20)
Bilirubin Total: 0.5 mg/dL (ref 0.0–1.2)
CO2: 20 mmol/L (ref 20–29)
Calcium: 9.6 mg/dL (ref 8.7–10.2)
Chloride: 106 mmol/L (ref 96–106)
Creatinine, Ser: 0.72 mg/dL (ref 0.57–1.00)
Globulin, Total: 3.2 g/dL (ref 1.5–4.5)
Glucose: 100 mg/dL — ABNORMAL HIGH (ref 70–99)
Potassium: 4.1 mmol/L (ref 3.5–5.2)
Sodium: 144 mmol/L (ref 134–144)
Total Protein: 7.5 g/dL (ref 6.0–8.5)
eGFR: 113 mL/min/{1.73_m2} (ref >59)

## 2023-12-11 LAB — TSH RFX ON ABNORMAL TO FREE T4: TSH: 1.26 u[IU]/mL (ref 0.450–4.500)

## 2023-12-11 LAB — CBC
Hematocrit: 38.5 % (ref 34.0–46.6)
Hemoglobin: 12.5 g/dL (ref 11.1–15.9)
MCH: 30.4 pg (ref 26.6–33.0)
MCHC: 32.5 g/dL (ref 31.5–35.7)
MCV: 94 fL (ref 79–97)
Platelets: 329 10*3/uL (ref 150–450)
RBC: 4.11 x10E6/uL (ref 3.77–5.28)
RDW: 13.1 % (ref 11.7–15.4)
WBC: 13.1 10*3/uL — ABNORMAL HIGH (ref 3.4–10.8)

## 2023-12-11 LAB — RPR: RPR Ser Ql: NONREACTIVE

## 2023-12-11 LAB — HCV AB W REFLEX TO QUANT PCR: HCV Ab: NONREACTIVE

## 2023-12-11 LAB — HIV ANTIBODY (ROUTINE TESTING W REFLEX): HIV Screen 4th Generation wRfx: NONREACTIVE

## 2023-12-11 LAB — LDL CHOLESTEROL, DIRECT: LDL Direct: 159 mg/dL — ABNORMAL HIGH (ref 0–99)

## 2023-12-11 LAB — HCV INTERPRETATION

## 2023-12-11 LAB — LITHIUM LEVEL: Lithium Lvl: 0.7 mmol/L (ref 0.5–1.2)

## 2023-12-11 LAB — HEPATITIS B CORE ANTIBODY, TOTAL: Hep B Core Total Ab: NEGATIVE

## 2023-12-11 LAB — HEPATITIS A ANTIBODY, TOTAL: hep A Total Ab: NEGATIVE

## 2023-12-11 LAB — HEPATITIS B SURFACE ANTIGEN: Hepatitis B Surface Ag: NEGATIVE

## 2023-12-13 ENCOUNTER — Other Ambulatory Visit: Payer: Self-pay

## 2023-12-13 ENCOUNTER — Ambulatory Visit (INDEPENDENT_AMBULATORY_CARE_PROVIDER_SITE_OTHER): Payer: MEDICAID

## 2023-12-13 VITALS — BP 125/80 | HR 131 | Wt 246.2 lb

## 2023-12-13 DIAGNOSIS — Z79899 Other long term (current) drug therapy: Secondary | ICD-10-CM

## 2023-12-13 DIAGNOSIS — R000 Tachycardia, unspecified: Secondary | ICD-10-CM | POA: Diagnosis not present

## 2023-12-13 NOTE — Progress Notes (Signed)
  SUBJECTIVE:   CHIEF COMPLAINT / HPI:   QT prolongation: Last seen on 12/10/2023 who presented for lithium  level check.  Lithium  level and TSH was in appropriate range.  Trace leukocytosis to 13.1.  During that visit, Dr. Delores spoke with cardiology.  She is following up today for a repeat EKG.  Her olanzapine  dosage was decreased from 20 mg to 10 mg.  She reports that she did not have any symptoms over the weekend.  She does have her echocardiogram scheduled for 12/28/2023.  She is also aware of the cardiology referral however has not been contacted yet for this.  PERTINENT  PMH / PSH: Bipolar 2 on lithium , OCD, RA  OBJECTIVE:  BP 125/80   Pulse (!) 131   Wt 246 lb 3.2 oz (111.7 kg)   SpO2 94%   BMI 42.26 kg/m  General: Well-appearing, NAD CV: Tachycardic, regular rhythm, no appreciable murmurs, 2+ left radial pulse Pulm: Normal WOB  ASSESSMENT/PLAN:   Assessment & Plan Lithium  use Normal level. Tachycardia Improved although not resolved.  Remains asymptomatic.  Reviewed lab work which was unremarkable with the exception of magnesium  which is pending.  Repeat EKG with improved tachycardia to 119 and QT prolongation resolved, EKGs in media tab.  She is awaiting response from cardiology referral.  Echocardiogram scheduled on 12/28/2023.  Return precautions discussed, otherwise return for follow-up after cardiology referral and echocardiogram. Return if symptoms worsen or fail to improve. Kieth Johnson, DO 12/13/2023, 2:39 PM PGY-3, [facility]

## 2023-12-13 NOTE — Patient Instructions (Addendum)
 It was great to see you today! Thank you for choosing [facility] for your primary care.  Today we addressed: Your labs look reassuring.  We are still waiting for the magnesium .  Your EKG has improved, specifically your QT prolongation.  You are still a little tachycardic.  Please let us  know if cardiology does not call you regarding that referral.  You have your echo scheduled on 12/28/2023.  If you haven't already, sign up for My Chart to have easy access to your labs results, and communication with your primary care physician.  Return if symptoms worsen or fail to improve. Please arrive 15 minutes before your appointment to ensure smooth check in process.  We appreciate your efforts in making this happen.  Thank you for allowing me to participate in your care, Kieth Johnson, DO 12/13/2023, 2:24 PM PGY-3, [facility]

## 2023-12-13 NOTE — Assessment & Plan Note (Signed)
 Normal level

## 2023-12-14 ENCOUNTER — Other Ambulatory Visit: Payer: Self-pay | Admitting: *Deleted

## 2023-12-14 LAB — MAGNESIUM: Magnesium: 2.4 mg/dL — ABNORMAL HIGH (ref 1.6–2.3)

## 2023-12-14 NOTE — Patient Outreach (Signed)
 Care Management   Visit Note  12/14/2023 Name: Natalie [name] MRN: [id number] DOB: 04-Apr-1991  Subjective: Natalie [name] is a 33 y.o. year old female who is a primary care patient of [name], MD. The Care Management team was consulted for assistance.      Engaged with patient spoke with patient by telephone.    Goals Addressed             This Visit's Progress    COMPLETED: RNCM Care Management Expected Outcome: Monitor, Self-Manage and Reduce Symptoms of: Rheumatoid Arthritis, Bipolar Disorder       Current Barriers:  Knowledge Deficits related to plan of care for management of Bipolar Disorder and Rheumatoid Arthritis  Chronic Disease Management support and education needs related to Bipolar Disorder and Rheumatoid Arthritis  Financial Constraints   RNCM Clinical Goal(s):  Patient will verbalize basic understanding of  Bipolar Disorder and Rheumatoid Arthritis disease process and self health management plan as evidenced by verbal explanation, recognizing symptoms, lifestyle modifications and daily monitoring take all medications exactly as prescribed and will call provider for medication related questions as evidenced by compliance with medications attend all scheduled medical appointments: with primary care provider and all specialist as evidenced by keeping all scheduled appointments.  demonstrate Improved and Ongoing adherence to prescribed treatment plan for Bipolar Disorder and Rheumatoid Arthritis  as evidenced by consistent medication compliance, symptom monitoring, continued lifestyle modifications, weight monitoring, exercise routines & education continue to work with RN Care Manager to address care management and care coordination needs related to  Bipolar Disorder and Rheumatoid Arthritis as evidenced by adherence to CM Team Scheduled appointments work with child psychotherapist to address  related to the management of Financial constraints related to [name],  Limited access to food, and Mental Health Concerns  related to the management of Bipolar Disorder and Rheumatoid Arthritis as evidenced by review of EMR and patient or child psychotherapist report through collaboration with [name], provider, and care team.   IMPORTANT NUMBERSBETHA Sero:  [phone]  [name]: [phone].  [name], Care Manager [phone]  [name], Care Manager Extender [phone] Transportation: [phone]  Interventions: Evaluation of current treatment plan related to  self management and patient's adherence to plan as established by provider     Bipolar Disorder (Status:  No needs identified this visit.)  Long Term Goal Evaluation of current treatment plan related to Bipolar Disorder, Mental Health Concerns  self-management and patient's adherence to plan as established by provider. Patient is being actively followed by psychiatry. Take all medication as prescribed. Reports compliance with all medications. Schedule routine follow-ups with psychiatrist to monitor medication efficacy and side effects: Regular blood tests for Lithium  to ensure therapeutic levels. Recent labs drawn. Relaxation techniques (mindfulness, meditation or yoga). Discussed plans with patient for ongoing care management follow up and provided patient with direct contact information for care management team   Pain Interventions R/T Rheumatoid Arthritis:  (Status:  Goal Met.) Long Term Goal Pain assessment performed. No complaints of pain. Medications reviewed Reviewed provider established plan for pain management Discussed importance of adherence to all scheduled medical appointments Counseled on the importance of reporting any/all new or changed pain symptoms or management strategies to pain management provider Advised patient to report to care team affect of pain on daily activities Discussed use of relaxation techniques and/or diversional activities to assist with pain  reduction (distraction, imagery, relaxation, massage, acupressure, TENS, heat, and cold application Advised patient to discuss any new changes with provider  RNCM spoke in detail with patient about tailoring programs related to range of motion, strength and flexibility. Use assistive devices if necessary. Low-impact activities such as walking, water  aerobics Stretching and strengthening exercises to improve joint function ex: therabands RNCM received in basket message from Dr. Delores requesting assistance in getting patient a rheumatologist in [place]. RNCM spoke with [facility], [facility] and they are unable to accept patient due to her having [place] Medicaid. Per the referral notes, patient was dismissed from [facility] in 2023. Patient is unaware as to why she was dismissed from the practice. RNCM provided update to patient that all 3 Rheumatology practices in [place] have been exhausted at this time. RNCM collaborated with [name], Care Manager with [facility] and she states that they will provide transportation for patient to continue receiving care at Dr. Ziolkowska's office in [facility]. RNCM & Janie provided contact number for transportation [phone]). RNCM scheduled follow up appointment with Dr. Ziolkowska on 02-29-2024 @ 8:00 am and notified patient. She was also placed on the cancellation list for an earlier appointment if once should become available.    Weight Loss Interventions:  (Status:  Goal on track:  NO.) Long Term Goal Advised patient to discuss with primary care provider options regarding weight management Provided verbal and/or written education to patient re: provider recommended life style modifications  Screening for signs and symptoms of depression Offered to connect patient with psychology or social work support for counseling and supportive care Reviewed recommended dietary changes: avoid fad diets, make small/incremental dietary  and exercise changes, eat at the table and avoid eating in front of the TV, plan management of cravings, monitor snacking and cravings in food diary  RNCM spoke to patient about lifestyle modifications related to diet. Patient expressed she gets limited about of food stamps and it is difficult for her to eat healthier. SW referral placed for food resources. Limit processed foods, sugars and excessive salt Anti-inflammatory diet rich in fruits, vegetables, whole grains and omega-3 fatty acids ex. Berries, cherries, beets, spinach, gale, broccoli, oats, brown rice, olive oil, avocados, nuts, fish(salmon), beans & lentils  Patient Goals/Self-Care Activities: Take all medications as prescribed Attend all scheduled provider appointments Call pharmacy for medication refills 3-7 days in advance of running out of medications Perform all self care activities independently  Call provider office for new concerns or questions  Work with the social worker to address care coordination needs and will continue to work with the clinical team to address health care and disease management related needs  Follow Up Plan:  No further follow up required: Patient has made successful contact with her care manager on today. [name], Care Manager will continue patient outreaches monthly and as needed. [name] can be reached at [phone].       RNCM ensured smooth transition to [facility] that will continue patient outreaches going forward. Patient has been provided with all contact numbers verbally and placed in her after visit summary today. No concerns from patient at this time. It has been a pleasure to provide chronic disease management and care coordination needs to you, Ms. Sousa. If any needs may arise, please let Dr. Delores know. Please take care!  Thanks, Rosina Forte, BSN RN RN Care Manager  [place]  Ambulatory Care Management  Direct Number: [phone]          Consent  to Services:  Patient was given information about care management services, agreed to services, and gave  verbal consent to participate.   Plan: No further follow up required.  Rosina Forte, BSN RN RN Care Manager  [place]  Ambulatory Care Management  Direct Number: [phone]

## 2023-12-14 NOTE — Patient Instructions (Signed)
 Visit Information  Thank you for taking time to visit with me today. Please don't hesitate to contact me if I can be of assistance to you before our next scheduled telephone appointment.  Following are the goals we discussed today:   Goals Addressed             This Visit's Progress    COMPLETED: RNCM Care Management Expected Outcome: Monitor, Self-Manage and Reduce Symptoms of: Rheumatoid Arthritis, Bipolar Disorder       Current Barriers:  Knowledge Deficits related to plan of care for management of Bipolar Disorder and Rheumatoid Arthritis  Chronic Disease Management support and education needs related to Bipolar Disorder and Rheumatoid Arthritis  Financial Constraints   RNCM Clinical Goal(s):  Patient will verbalize basic understanding of  Bipolar Disorder and Rheumatoid Arthritis disease process and self health management plan as evidenced by verbal explanation, recognizing symptoms, lifestyle modifications and daily monitoring take all medications exactly as prescribed and will call provider for medication related questions as evidenced by compliance with medications attend all scheduled medical appointments: with primary care provider and all specialist as evidenced by keeping all scheduled appointments.  demonstrate Improved and Ongoing adherence to prescribed treatment plan for Bipolar Disorder and Rheumatoid Arthritis  as evidenced by consistent medication compliance, symptom monitoring, continued lifestyle modifications, weight monitoring, exercise routines & education continue to work with RN Care Manager to address care management and care coordination needs related to  Bipolar Disorder and Rheumatoid Arthritis as evidenced by adherence to CM Team Scheduled appointments work with child psychotherapist to address  related to the management of Financial constraints related to [name], Limited access to food, and Mental Health Concerns  related to the management of Bipolar Disorder  and Rheumatoid Arthritis as evidenced by review of EMR and patient or child psychotherapist report through collaboration with [name], provider, and care team.   IMPORTANT NUMBERSBETHA [name]:  [phone]  [name]: [phone].  Natalie [name], Care Manager [phone]  Natalie [name], Care Manager Extender [phone] Transportation: [phone]  Interventions: Evaluation of current treatment plan related to  self management and patient's adherence to plan as established by provider     Bipolar Disorder (Status:  No needs identified this visit.)  Long Term Goal Evaluation of current treatment plan related to Bipolar Disorder, Mental Health Concerns  self-management and patient's adherence to plan as established by provider. Patient is being actively followed by psychiatry. Take all medication as prescribed. Reports compliance with all medications. Schedule routine follow-ups with psychiatrist to monitor medication efficacy and side effects: Regular blood tests for Lithium  to ensure therapeutic levels. Recent labs drawn. Relaxation techniques (mindfulness, meditation or yoga). Discussed plans with patient for ongoing care management follow up and provided patient with direct contact information for care management team   Pain Interventions R/T Rheumatoid Arthritis:  (Status:  Goal Met.) Long Term Goal Pain assessment performed. No complaints of pain. Medications reviewed Reviewed provider established plan for pain management Discussed importance of adherence to all scheduled medical appointments Counseled on the importance of reporting any/all new or changed pain symptoms or management strategies to pain management provider Advised patient to report to care team affect of pain on daily activities Discussed use of relaxation techniques and/or diversional activities to assist with pain reduction (distraction, imagery, relaxation, massage, acupressure, TENS, heat, and cold  application Advised patient to discuss any new changes with provider RNCM spoke in detail with patient about tailoring programs related to range of motion, strength and flexibility. Use  assistive devices if necessary. Low-impact activities such as walking, water  aerobics Stretching and strengthening exercises to improve joint function ex: therabands RNCM received in basket message from Natalie [name] requesting assistance in getting patient a rheumatologist in [place]. RNCM spoke with Natalie [name], [name] and they are unable to accept patient due to her having [place] Medicaid. Per the referral notes, patient was dismissed from [name] in 2023. Patient is unaware as to why she was dismissed from the practice. RNCM provided update to patient that all 3 [name] practices in [place] have been exhausted at this time. RNCM collaborated with Natalie [name], Care Manager with [name] and she states that they will provide transportation for patient to continue receiving care at Natalie [name]'s office in [facility]. RNCM & Natalie provided contact number for transportation [phone]). RNCM scheduled follow up appointment with Natalie [name] on 02-29-2024 @ 8:00 am and notified patient. She was also placed on the cancellation list for an earlier appointment if once should become available.    Weight Loss Interventions:  (Status:  Goal on track:  NO.) Long Term Goal Advised patient to discuss with primary care provider options regarding weight management Provided verbal and/or written education to patient re: provider recommended life style modifications  Screening for signs and symptoms of depression Offered to connect patient with psychology or social work support for counseling and supportive care Reviewed recommended dietary changes: avoid fad diets, make small/incremental dietary and exercise changes, eat at the table and avoid eating in front of the TV, plan  management of cravings, monitor snacking and cravings in food diary  RNCM spoke to patient about lifestyle modifications related to diet. Patient expressed she gets limited about of food stamps and it is difficult for her to eat healthier. SW referral placed for food resources. Limit processed foods, sugars and excessive salt Anti-inflammatory diet rich in fruits, vegetables, whole grains and omega-3 fatty acids ex. Berries, cherries, beets, spinach, gale, broccoli, oats, brown rice, olive oil, avocados, nuts, fish(salmon), beans & lentils  Patient Goals/Self-Care Activities: Take all medications as prescribed Attend all scheduled provider appointments Call pharmacy for medication refills 3-7 days in advance of running out of medications Perform all self care activities independently  Call provider office for new concerns or questions  Work with the social worker to address care coordination needs and will continue to work with the clinical team to address health care and disease management related needs  Follow Up Plan:  No further follow up required: Patient has made successful contact with her care manager on today. Natalie [name], Care Manager will continue patient outreaches monthly and as needed. Natalie [name] can be reached at [phone].       RNCM ensured smooth transition to [facility] that will continue patient outreaches going forward. Patient has been provided with all contact numbers verbally and placed in her after visit summary today. No concerns from patient at this time. It has been a pleasure to provide chronic disease management and care coordination needs to you, Ms. Eppes. If any needs may arise, please let Natalie [name] know. Please take care!  Thanks, Rosina Forte, BSN RN RN Care Manager  [place]  Ambulatory Care Management  Direct Number: [phone]           Please call the care guide team at [phone] if you need to cancel or reschedule  your appointment.   If you are experiencing a Mental Health or Behavioral Health Crisis or need someone to  talk to, please call the Suicide and Crisis Lifeline: 988 call the [place]  National Suicide Prevention Lifeline: (669)345-8872 or TTY: 930-788-5307 TTY 3433661644) to talk to a trained counselor call 1-800-273-TALK (toll free, 24 hour hotline) go to [facility] [address] [place] [phone])   Patient verbalizes understanding of instructions and care plan provided today and agrees to view in MyChart. Active MyChart status and patient understanding of how to access instructions and care plan via MyChart confirmed with patient.     No further follow up required: [facility] now engaged with patient  Rosina Forte, BSN RN RN Care Manager  [facility]  Ambulatory Care Management  Direct Number: [phone]

## 2023-12-28 ENCOUNTER — Telehealth: Payer: Self-pay | Admitting: Family Medicine

## 2023-12-28 ENCOUNTER — Ambulatory Visit (HOSPITAL_COMMUNITY)
Admission: RE | Admit: 2023-12-28 | Discharge: 2023-12-28 | Disposition: A | Discharge status: Home / Self Care | Payer: MEDICAID | Source: Ambulatory Visit | Attending: Family Medicine

## 2023-12-28 DIAGNOSIS — R000 Tachycardia, unspecified: Secondary | ICD-10-CM | POA: Insufficient documentation

## 2023-12-28 LAB — ECHOCARDIOGRAM COMPLETE
AR max vel: 2.16 cm2
AV Area VTI: 2.17 cm2
AV Area mean vel: 2.11 cm2
AV Mean grad: 4 mm[Hg]
AV Peak grad: 8 mm[Hg]
Ao pk vel: 1.41 m/s
Area-P 1/2: 4.49 cm2
S' Lateral: 2.9 cm

## 2023-12-28 NOTE — Progress Notes (Signed)
*  PRELIMINARY RESULTS* Echocardiogram 2D Echocardiogram has been performed.  Natalie [name] 12/28/2023, 10:33 AM

## 2023-12-28 NOTE — Telephone Encounter (Signed)
Called patient to discuss echo. This does show there is elevated IVC pressure ? If due to obesity/potentially untreated OSA. Discussed sleep study. Declined. She feels well, has cardiology upcoming.  [name], MD  Family Medicine Teaching Service

## 2024-01-04 ENCOUNTER — Telehealth: Payer: Self-pay | Admitting: Family Medicine

## 2024-01-04 NOTE — Telephone Encounter (Signed)
Patient calls requesting a letter be sent to her mychart stating she qualifies to have a support pet. Please call patient with any further questions/concerns.

## 2024-01-06 NOTE — Telephone Encounter (Signed)
 Called patient. Will discuss at upcoming visit in April  She desires a cat to support her anxiety

## 2024-01-07 NOTE — Telephone Encounter (Signed)
 Patient calls nurse line in regards to emotional support animal.   Patient advised PCP would like to discuss with her prior to writing the letter. Advised to make a sooner apt with PCP than April. I offered 2/28 virtually, however she declined and stated she did not want to wait that long.   She stated, if she just wants to talk to me about it she can call me.   Advised will forward to PCP.

## 2024-01-07 NOTE — Telephone Encounter (Signed)
 A letter is in the letters tabs Otho Blitz, MD  [facility] Medicine Teaching Service

## 2024-01-07 NOTE — Telephone Encounter (Signed)
 Pt calls back today.  She would like the letter before her appt in April.  I attempted to schedule appt on 01/25/24, but patient did not want to do that.  Will forward to MD. Macario Savin, CMA

## 2024-01-07 NOTE — Telephone Encounter (Signed)
 Ok for virtual visit and will address during that visit

## 2024-01-10 NOTE — Telephone Encounter (Signed)
 Called patient and she is requesting if the letter can be sent via Mychart because it is easier for her that way. Advised her that if PCP agrees the letter will most likely show up in Mychart or the office will give her a call. Alain Howard CMA

## 2024-01-10 NOTE — Telephone Encounter (Signed)
 Letter rerouted to mychart.   Patient reports confirmation.

## 2024-01-10 NOTE — Telephone Encounter (Signed)
 RN team--are you able to do this? I cannot make it go to Mychart on my end  Thanks Otho Blitz, MD  [facility] Medicine Teaching Service

## 2024-02-16 ENCOUNTER — Ambulatory Visit: Payer: MEDICAID | Admitting: Cardiology

## 2024-03-03 NOTE — Progress Notes (Signed)
    SUBJECTIVE:   CHIEF COMPLAINT: referrals, check heart  HPI:   Natalie [name] is a 33 y.o.  with history notable for RA, mood disorder, and markedly prolonged Qtc  presenting for follow up.   She reports itchy dry eyes. No other allergy symptoms. No vision changes.  Took her Enbrel a few weeks ago and the corner of one eye was very swollen per patient. Resolved the next day. Very hesistant to use ever again.  Reports her mood is okay. Appetite is less on lower dose of Lithium. No changes in bowel habits or unintentional weight loss.   PERTINENT  PMH / PSH/Family/Social History : reviewed and updated   OBJECTIVE:   BP 102/70   Pulse (!) 132   Ht 5\' 4"  (1.626 m)   Wt 232 lb (105.2 kg)   LMP 02/29/2024   SpO2 98%   BMI 39.82 kg/m   Today's weight:  Last Weight  Most recent update: 03/06/2024 11:20 AM    Weight  105.2 kg (232 lb)            Review of prior weights: [employer]   03/06/24 1119  Weight: 232 lb (105.2 kg)    RRR Lungs clear Abdomen soft + xerosis and flaking of skni   ASSESSMENT/PLAN:   Assessment & Plan Rheumatoid arthritis with positive rheumatoid factor, involving unspecified site [facility]) Referral to [facility] Previously dismissed due to no shows (transport) Sent message to Dr. Dimple Casey   Discussed that it was unlikely Enbrel caused single day edematous eye Discussed importance of DMARD therapy  Bipolar 2 disorder (HCC) Monitoring labs today  Lithium use Updated medication list  Xerosis of skin Vaseline BID  Prolonged Q-T interval on ECG Cardiology visit scheduled Has had significant reduction in Lithium dose Reviewed Echo   Okay for virtual visit in July  [name], MD  Family Medicine Teaching Service  [facility]

## 2024-03-06 ENCOUNTER — Ambulatory Visit (INDEPENDENT_AMBULATORY_CARE_PROVIDER_SITE_OTHER): Payer: MEDICAID | Admitting: Family Medicine

## 2024-03-06 ENCOUNTER — Encounter: Payer: Self-pay | Admitting: Family Medicine

## 2024-03-06 VITALS — BP 102/70 | HR 132 | Ht 64.0 in | Wt 232.0 lb

## 2024-03-06 DIAGNOSIS — L853 Xerosis cutis: Secondary | ICD-10-CM

## 2024-03-06 DIAGNOSIS — F3181 Bipolar II disorder: Secondary | ICD-10-CM

## 2024-03-06 DIAGNOSIS — R9431 Abnormal electrocardiogram [ECG] [EKG]: Secondary | ICD-10-CM | POA: Insufficient documentation

## 2024-03-06 DIAGNOSIS — Z79899 Other long term (current) drug therapy: Secondary | ICD-10-CM

## 2024-03-06 DIAGNOSIS — M059 Rheumatoid arthritis with rheumatoid factor, unspecified: Secondary | ICD-10-CM | POA: Diagnosis not present

## 2024-03-06 MED ORDER — OLOPATADINE HCL 0.2 % OP SOLN: OPHTHALMIC | 3 refills | Status: AC

## 2024-03-06 NOTE — Assessment & Plan Note (Signed)
 Updated medication list

## 2024-03-06 NOTE — Assessment & Plan Note (Addendum)
 Vaseline BID

## 2024-03-06 NOTE — Assessment & Plan Note (Addendum)
Monitoring labs today

## 2024-03-06 NOTE — Patient Instructions (Signed)
 It was wonderful to see you today.  Please bring ALL of your medications with you to every visit.   Today we talked about:   I sent in [place] for your eyes Use 1 drop twice a day   I will message with blood work results   I will message Dr. Gregary Cromer office   Please follow up in 3 months   Thank you for choosing [facility].   Please call [phone] with any questions about today's appointment.  Please be sure to schedule follow up at the front  desk before you leave today.   [name], MD  Family Medicine

## 2024-03-06 NOTE — Assessment & Plan Note (Addendum)
 Cardiology visit scheduled Has had significant reduction in Lithium dose Reviewed Echo

## 2024-03-06 NOTE — Assessment & Plan Note (Addendum)
 Referral to [facility] Rheumatology Previously dismissed due to no shows (transport) Sent message to Dr. Dimple Casey   Discussed that it was unlikely Enbrel caused single day edematous eye Discussed importance of DMARD therapy

## 2024-03-07 ENCOUNTER — Telehealth: Payer: Self-pay | Admitting: Family Medicine

## 2024-03-07 LAB — CBC
Hematocrit: 37 % (ref 34.0–46.6)
Hemoglobin: 12.1 g/dL (ref 11.1–15.9)
MCH: 30.3 pg (ref 26.6–33.0)
MCHC: 32.7 g/dL (ref 31.5–35.7)
MCV: 93 fL (ref 79–97)
Platelets: 330 10*3/uL (ref 150–450)
RBC: 4 x10E6/uL (ref 3.77–5.28)
RDW: 12.7 % (ref 11.7–15.4)
WBC: 15.7 10*3/uL — ABNORMAL HIGH (ref 3.4–10.8)

## 2024-03-07 LAB — BASIC METABOLIC PANEL WITH GFR
BUN/Creatinine Ratio: 7 — ABNORMAL LOW (ref 9–23)
BUN: 4 mg/dL — ABNORMAL LOW (ref 6–20)
CO2: 20 mmol/L (ref 20–29)
Calcium: 9.6 mg/dL (ref 8.7–10.2)
Chloride: 105 mmol/L (ref 96–106)
Creatinine, Ser: 0.59 mg/dL (ref 0.57–1.00)
Glucose: 97 mg/dL (ref 70–99)
Potassium: 4.9 mmol/L (ref 3.5–5.2)
Sodium: 141 mmol/L (ref 134–144)
eGFR: 122 mL/min/{1.73_m2} (ref >59)

## 2024-03-07 LAB — FERRITIN: Ferritin: 103 ng/mL (ref 15–150)

## 2024-03-07 LAB — LITHIUM LEVEL: Lithium Lvl: 0.5 mmol/L (ref 0.5–1.2)

## 2024-03-07 NOTE — Telephone Encounter (Signed)
 Attempted to call patient. Reached voicemail, left generic voicemail to call back.  If she calls back - Results look okay - White cell (infection fighting cells) still elevated - needs CBC with differential and smear in 1 month--possibly related to prednisone use  Natalie Starr, MD  [facility] Medicine Teaching Service

## 2024-05-09 ENCOUNTER — Ambulatory Visit: Payer: MEDICAID | Admitting: Cardiology

## 2024-05-22 ENCOUNTER — Telehealth: Payer: Self-pay

## 2024-05-22 DIAGNOSIS — M059 Rheumatoid arthritis with rheumatoid factor, unspecified: Secondary | ICD-10-CM

## 2024-05-22 NOTE — Telephone Encounter (Signed)
 Patient calls nurse line requesting to speak with PCP.   She reports continued difficulty with RA. She reports she has not heard anything from RA referral.   See referral notes for more details.   She is requesting medication management from PCP in the meantime to help control her pain.  Patient scheduled with PCP for 7/11. She is requesting this be virtual due to transportation issues.   Advised will forward to PCP.

## 2024-05-23 NOTE — Telephone Encounter (Signed)
 Called patient and discussed. I have tried multiple options for rheumatologist.  VBCI referral sent to see if they can assist.  Suzann Daring, MD  [facility] Medicine Teaching Service

## 2024-05-23 NOTE — Telephone Encounter (Signed)
 Red Team Please let patient know I will call about her pain in the coming days as time allows. If urgent, please schedule her with PGY3 for virtual visit.   I have reached out to Rheumatology again about the referral--they have denied her referral request X2 (I have asked for clarification as to why).  Okay for virtual visit  7/11--please let her know and change visit accordingly   Suzann Daring, MD  [facility] Medicine Teaching Service

## 2024-05-23 NOTE — Telephone Encounter (Signed)
 Pt informed and would rather keep 7/11 appt. Read Bonelli Norville, CMA

## 2024-05-24 ENCOUNTER — Telehealth: Payer: Self-pay

## 2024-05-24 NOTE — Progress Notes (Signed)
 Complex Care Management Note  Care Guide Note 05/24/2024 Name: Magalene Mclear MRN: [id number] DOB: 1991-09-27  Alania Overholt is a 33 y.o. year old female who sees [name], MD for primary care. I reached out to Pierce Husband by phone today to offer complex care management services.  Ms. Zylka was given information about Complex Care Management services today including:   The Complex Care Management services include support from the care team which includes your Nurse Care Manager, Clinical Social Worker, or Pharmacist.  The Complex Care Management team is here to help remove barriers to the health concerns and goals most important to you. Complex Care Management services are voluntary, and the patient may decline or stop services at any time by request to their care team member.   Complex Care Management Consent Status: Patient agreed to services and verbal consent obtained.   Follow up plan:  Telephone appointment with complex care management team member scheduled for:  06-05-24 at 1:00 pm  Encounter Outcome:  Patient Scheduled  Leotis Rase [facility], [name] Guide  Direct Dial: [phone]  Fax [phone]

## 2024-06-05 ENCOUNTER — Other Ambulatory Visit: Payer: MEDICAID

## 2024-06-05 NOTE — Patient Instructions (Signed)
 Visit Information  Thank you for taking time to visit with me today.   Tailored Plan Medicaid On May 31, 2023 some people on [place] Medicaid will move to a new kind of Medicaid health plan called a Tailored Plan. Tailored Plans cover your doctor visits, prescription drugs, and health care services.    If your [place] Medicaid will move to a Tailored Plan, you should have gotten a letter and welcome packet. If you're not sure, call your [place] Medicaid Enrollment Broker at [phone] and ask.  Check out these free materials, in [place] and [place], to learn more about your Tailored Plan: Medicaid.[email or web address].Gov/Tailored-Plans/Toolkit  Tailored Care Management Services  TCM services are available to you now. If you are a Tailored Plan member or will be and want information about Tailored Care Management Services including rides to appointments and community and home services, call the Care Management provider for your county of residence:    [facility] [place], Theodis Fiscal)  Member Services: [phone] Behavioral Health Crisis Line: [phone], [place], [place], [place], [place])  Member Services: [phone] Behavioral Health Crisis Line: 319-176-7716     Please call the Suicide and Crisis Lifeline: 988 go to [facility] [address] [place] [phone]) call 911 if you are experiencing a Mental Health or Behavioral Health Crisis or need someone to talk to.  Patient verbalizes understanding of instructions and care plan provided today and agrees to view in MyChart. Active MyChart status and patient understanding of how to access instructions and care plan via MyChart confirmed with patient.     Burt Casco, BSW [place]/VBCI - [employer] Social Worker [phone]

## 2024-06-05 NOTE — Patient Outreach (Signed)
 Complex Care Management   Visit Note  06/05/2024  Name:  Natalie [name] MRN: [id number] DOB: Jun 18, 1991  Situation: Referral received for Complex Care Management related to finding a rheumatologist and connecting to [facility] care manager I obtained verbal consent from Patient.  Visit completed with patient  on the phone  Background:   Past Medical History:  Diagnosis Date   Anxiety    Chronic bipolar disorder (HCC)    Depression    H/O suicide attempt 05-2013   OD   Obesity    OCD (obsessive compulsive disorder)    Psychiatric diagnosis     Assessment: BSW met with patient over the phone for initial call. Patient was alert and cognitive. SDOH needs were assessed and the following needs were identified: Food insecurity. Patient states she sometimes has issues finding out what to eat or runs out of food. Patient states she receives food stamps benefit, but has experienced a reduction in benefit amount since the passing of her husband. Patient was offered referrals to food pantries but declined. BSW made patient aware that if she changed hr mind about it in the future she could call BSW and receive information on local food pantries. Patient understood and agreed. While on the phone, patient inquired about gym memberships. Per [employer], patient might be eligible for a gym membership through [place] with [employer]. Patient was educated on her options and decided to hold of on making the referral for this benefit until she can call [employer] to get more information on the other option offered to her. Patient also asked if supplemental drinks like Boost or Ensure are covered. BSW was not sure and encouraged patient to ask her provider when she calls regarding the gym membership benefit. Patient understood and agreed. BSW completed referral for on going care management through [facility] online form. Patient states her main goal is to find a rheumatologist for her rheumatoid  arthritis disease as soon as a possible. Patient stated her mother is her payee and handles all her expenses (rent, utilities) but uses her disability benefit to cover expenses. No additional needs identified at this time. BSW provided patient direct phone # to patient.   SDOH Interventions    Flowsheet Row Patient Outreach Telephone from 06/05/2024 in [place] POPULATION HEALTH DEPARTMENT Care Coordination from 11/08/2023 in [facility] Community Care Coordination Care Coordination from 11/03/2023 in [facility] Community Care Coordination Care Coordination from 10/22/2023 in [facility] Community Care Coordination Patient Outreach from 10/18/2023 in [place] POPULATION HEALTH DEPARTMENT Care Coordination from 02/16/2023 in [facility] Community Care Coordination  SDOH Interventions        Food Insecurity Interventions Intervention Not Indicated  [patient declined referral for food pantries.] Intervention Not Indicated --  [parents provided food resources] Other (Comment)  [Patient stated that she runs low and the SW will email her some resources] AMB Referral --  Housing Interventions Intervention Not Indicated -- Intervention Not Indicated Intervention Not Indicated Intervention Not Indicated --  Transportation Interventions Intervention Not Indicated, SCAT Psychologist, clinical) -- Intervention Not Indicated, SCAT (Research scientist (life sciences)) Intervention Not Indicated, SCAT (Specialized Community Area Transporation)  Patent examiner was approved for SCAT] -- Intervention Not Indicated  Utilities Interventions Intervention Not Indicated Intervention Not Indicated -- Intervention Not Indicated Intervention Not Indicated --  Financial Strain Interventions -- -- -- Intervention Not Indicated  [Patient stated that her mother is her payee and pays all her bills] Intervention Not Indicated --  Physical Activity Interventions --  -- -- -- Other (Comments)  [Referral for SW, patient inquiried discounted rates for  [employer]] --  Stress Interventions -- -- -- -- Other (Comment)  [Referred to SW] --  Social Connections Interventions -- -- -- -- Intervention Not Indicated --  Health Literacy Interventions -- -- -- -- Intervention Not Indicated --      Recommendation:   Patient will call insurance provider to inquire about gym membership benefit and other questions she has regarding coverage for supplemental drinks, and PCS.  Follow Up Plan:   Telephone follow up appointment date/time:  06/19/2024 at 9AM  Laymon Doll, [place]/VBCI - [facility] Social Worker [phone]

## 2024-06-08 NOTE — Progress Notes (Signed)
 [facility] Telemedicine Visit  Patient consented to have virtual visit and was identified by name and date of birth. Method of visit: Video  Encounter participants: Patient: Natalie [name] - located at home Provider: Suzann CHRISTELLA Daring - located at office  Chief Complaint: follow up  HPI:  Patient presents for follow up on pain. Patient has RA, bipolar disoder.   RA Pain Has stiffness and pain each morning for more than 30 minutes. Takes Tylenol  which does help. Has been taking intermittently. Eating well. No fevers or rashes.   Mood Husband died this past year. Has not left house in she doesn't not how long. Reports this is because of RA pain. Parents deliver food to home. Reports she will leave for Rheumatologist appointment if needed.   Has therapy at end of the month. Continues to see Psychiatry. Denies SI/HI.    Cycle Having a cycle twice month. Reports she had a period the beginning of June and then the very end of June. No symptoms or signs of anemia. No sexually active currently.   ROS: per HPI  Pertinent PMHx: RA, Bipolar   Exam:  Talkative, appropriate, dressed for visit   Assessment/Plan:  Assessment & Plan Rheumatoid arthritis with positive rheumatoid factor, involving unspecified site (HCC) Tylenol  TID VBCI referral to help with identifying a Rheumatologist in [place]  Prolonged Q-T interval on ECG Recommended Cardiology evaluation, she declined  Bipolar 2 disorder (HCC) Doing well on medications  I am concerned she is having them some agoraphobia.  She believes that she is not leaving the house due to her pain.  We discussed this at length.  She is willing to leave the house for rheumatology visit. Menstrual changes ? If due to short cycle She will keep Diary     Time spent during visit with patient: 11 minutes

## 2024-06-09 ENCOUNTER — Encounter: Payer: Self-pay | Admitting: Family Medicine

## 2024-06-09 ENCOUNTER — Telehealth: Payer: MEDICAID | Admitting: Family Medicine

## 2024-06-09 DIAGNOSIS — F3181 Bipolar II disorder: Secondary | ICD-10-CM

## 2024-06-09 DIAGNOSIS — M059 Rheumatoid arthritis with rheumatoid factor, unspecified: Secondary | ICD-10-CM

## 2024-06-09 DIAGNOSIS — N926 Irregular menstruation, unspecified: Secondary | ICD-10-CM

## 2024-06-09 DIAGNOSIS — R9431 Abnormal electrocardiogram [ECG] [EKG]: Secondary | ICD-10-CM | POA: Diagnosis not present

## 2024-06-09 NOTE — Assessment & Plan Note (Addendum)
 Recommended Cardiology evaluation, she declined

## 2024-06-09 NOTE — Assessment & Plan Note (Addendum)
 Doing well on medications  I am concerned she is having them some agoraphobia.  She believes that she is not leaving the house due to her pain.  We discussed this at length.  She is willing to leave the house for rheumatology visit.

## 2024-06-09 NOTE — Assessment & Plan Note (Signed)
 Tylenol  TID VBCI referral to help with identifying a Rheumatologist in [place]

## 2024-06-22 ENCOUNTER — Other Ambulatory Visit: Payer: MEDICAID

## 2024-06-22 NOTE — Patient Outreach (Signed)
 Complex Care Management   Visit Note  06/22/2024  Name:  Natalie [name] MRN: [id number] DOB: 08-31-1991  Situation: Referral received for Complex Care Management related to SDOH Barriers:  Food insecurity And connecting to care manager to find a rheumatologist I obtained verbal consent from Patient.  Visit completed with patient  on the phone  Background:   Past Medical History:  Diagnosis Date   Anxiety    Chronic bipolar disorder (HCC)    Depression    H/O suicide attempt 05-2013   OD   Obesity    OCD (obsessive compulsive disorder)    Psychiatric diagnosis     Assessment: BSW conducted f/u call with patient. Patient was alert and cognitive. BSW reviewed SDOH needs with patient and patient declined referrals for food insecurity. Per patient, many of the pantries that give out food have food that she does not eat or goes bad quickly. BSW informed patient he contacted 2 providers who have rheumatologist; [facility]ology [phone]) and [facility] in [facility] [phone]). Patient was informed that both providers accept her health insurance. However, [facility] Rhe[facility]ology would not be able to see her until Jan or Feb. 2026. Patient understood, but was very adamant about seeing a doctor in [place] due to transportation. While on the phone, BSW and patient dialed her care manager Juliane Glatter [phone]  [email or web address]) to introduce patient to him. Patient and care manager had spoke in the past before and was aware of her need for a rheumatologist. Patient was instructed care manager would be her point of contact moving forward. Care manager confirmed he is working on finding something for patient, but BSW will share providers via email for reference in case patient decides to move forward with [facility] Rheumatology. Patient understood and confirmed she has care manager contact information and agreed to case closure. No other  resources were provided/requested at this time.    SDOH Interventions    Flowsheet Row Patient Outreach Telephone from 06/22/2024 in [place] POPULATION HEALTH DEPARTMENT Patient Outreach Telephone from 06/05/2024 in [place] POPULATION HEALTH DEPARTMENT Care Coordination from 11/08/2023 in [facility] Community Care Coordination Care Coordination from 11/03/2023 in [facility] Community Care Coordination Care Coordination from 10/22/2023 in [facility] Care Coordination Patient Outreach from 10/18/2023 in [place] POPULATION HEALTH DEPARTMENT  SDOH Interventions        Food Insecurity Interventions Intervention Not Indicated  [patient declined referrals for food pantry] Intervention Not Indicated  [patient declined referral for food pantries.] Intervention Not Indicated --  [parents provided food resources] Other (Comment)  [Patient stated that she runs low and the SW will email her some resources] AMB Referral  Housing Interventions Intervention Not Indicated Intervention Not Indicated -- Intervention Not Indicated Intervention Not Indicated Intervention Not Indicated  Transportation Interventions SCAT (Specialized Community Area Transporation), Payor Benefit  [patient was educated on transportation benefit through [employer]] Intervention Not Indicated, SCAT (Research scientist (life sciences)) -- Intervention Not Indicated, SCAT (Research scientist (life sciences)) Intervention Not Indicated, SCAT (Specialized Community Area Transporation)  Patent examiner was approved for SCAT] --  Utilities Interventions Intervention Not Indicated Intervention Not Indicated Intervention Not Indicated -- Intervention Not Indicated Intervention Not Indicated  Financial Strain Interventions Intervention Not Indicated  [Patient stated that her mother is her payee and pays all her bills] -- -- -- Intervention Not Indicated  [Patient stated that her mother is her  payee and pays all her bills] Intervention Not Indicated  Physical  Activity Interventions -- -- -- -- -- Other (Comments)  [Referral for SW, patient inquiried discounted rates for  [employer]]  Stress Interventions -- -- -- -- -- Other (Comment)  [Referred to SW]  Social Connections Interventions -- -- -- -- -- Intervention Not Indicated  Health Literacy Interventions -- -- -- -- -- Intervention Not Indicated      Recommendation:   Continue working with care manager to find a Rheumatologist    Follow Up Plan:   Patient has met all care management goals. Care Management case will be closed. Patient has been provided contact information should new needs arise.  Patient was connected to care manger for on-going care management needs.   Laymon Doll, BSW [place]/VBCI - [employer] Social Worker [phone]

## 2024-06-22 NOTE — Patient Instructions (Signed)
 Visit Information  Thank you for taking time to visit with me today.   Tailored Plan Medicaid On May 31, 2023 some people on [place] Medicaid will move to a new kind of Medicaid health plan called a Tailored Plan. Tailored Plans cover your doctor visits, prescription drugs, and health care services.    If your [place] Medicaid will move to a Tailored Plan, you should have gotten a letter and welcome packet. If you're not sure, call your [place] Medicaid Enrollment Broker at [phone] and ask.  Check out these free materials, in [place] and [place], to learn more about your Tailored Plan: Medicaid.[email or web address].Gov/Tailored-Plans/Toolkit  Tailored Care Management Services  TCM services are available to you now. If you are a Tailored Plan member or will be and want information about Tailored Care Management Services including rides to appointments and community and home services, call the Care Management provider for your county of residence:    [facility] [place], Theodis Fiscal)  Member Services: [phone] Behavioral Health Crisis Line: [phone], [place], [place], [place], [place])  Member Services: [phone] Behavioral Health Crisis Line: 319-176-7716     Please call the Suicide and Crisis Lifeline: 988 go to [facility] [address] [place] [phone]) call 911 if you are experiencing a Mental Health or Behavioral Health Crisis or need someone to talk to.  Patient verbalizes understanding of instructions and care plan provided today and agrees to view in MyChart. Active MyChart status and patient understanding of how to access instructions and care plan via MyChart confirmed with patient.     Burt Casco, BSW [place]/VBCI - [employer] Social Worker [phone]

## 2024-07-28 ENCOUNTER — Telehealth: Payer: Self-pay | Admitting: Family Medicine

## 2024-07-28 DIAGNOSIS — M059 Rheumatoid arthritis with rheumatoid factor, unspecified: Secondary | ICD-10-CM

## 2024-07-28 DIAGNOSIS — F3181 Bipolar II disorder: Secondary | ICD-10-CM

## 2024-07-28 NOTE — Telephone Encounter (Signed)
 Patient called in asking to get a referral for an eye doctor. She is on a new medication and was told that she would need to get a retinal eye exam regularly while on medication.

## 2024-07-28 NOTE — Telephone Encounter (Signed)
 Once referral is place I can send it. Thanks!Cassell Mary CMA

## 2024-07-28 NOTE — Telephone Encounter (Signed)
Referral placed.  Mandy Fitzwater, MD  Family Medicine Teaching Service   

## 2024-08-16 NOTE — Progress Notes (Unsigned)
 [facility] Telemedicine Visit  Patient consented to have virtual visit and was identified by name and date of birth. Method of visit: {TELEPHONE VS CPIZN:77516}  Encounter participants: Patient: Natalie [name] - located at *** Provider: Suzann CHRISTELLA Daring - located at *** Others (if applicable): ***  Chief Complaint: ***  HPI:  ***  ROS: per HPI  Pertinent PMHx: ***  Exam:  There were no vitals taken for this visit.  Respiratory: ***  Assessment/Plan:  No problem-specific Assessment & Plan notes found for this encounter.    Time spent during visit with patient: *** minutes  {Billing info - this will automatically delete when the note is signed:1} {For Audio only, bill [id number] / [phone] as usual with modifier 93 attached:1} {For Audio & Video, bill [id number] / [phone] as usual with modifier 95 attached:1}

## 2024-08-17 ENCOUNTER — Encounter: Payer: Self-pay | Admitting: Family Medicine

## 2024-08-17 ENCOUNTER — Telehealth: Payer: MEDICAID | Admitting: Family Medicine

## 2024-08-17 DIAGNOSIS — M059 Rheumatoid arthritis with rheumatoid factor, unspecified: Secondary | ICD-10-CM

## 2024-08-17 DIAGNOSIS — R9431 Abnormal electrocardiogram [ECG] [EKG]: Secondary | ICD-10-CM | POA: Diagnosis not present

## 2024-08-17 DIAGNOSIS — F3181 Bipolar II disorder: Secondary | ICD-10-CM

## 2024-08-17 NOTE — Assessment & Plan Note (Signed)
 Doing well on medications Monitoring labs at follow up

## 2024-08-17 NOTE — Assessment & Plan Note (Signed)
 EKG at follow up

## 2024-08-17 NOTE — Assessment & Plan Note (Signed)
 Much improved Still has difficulty ambulating HHPT/OT ordered

## 2024-08-28 LAB — OPHTHALMOLOGY REPORT-SCANNED

## 2024-09-22 NOTE — Progress Notes (Signed)
    SUBJECTIVE:   CHIEF COMPLAINT: check up HPI:   Natalie [name] is a 33 y.o.  with history notable for RA, bipolar disorder presenting for follow up.   Reports she is doing well. She has been watching her food intake, weight down 60 pounds in 1 year. She feels her medication changes have made a big difference in appetite.  Still having menses at start and end of month--start date more than 25 days apart. Not sexually active.  At last in person visit, QTC was prolonged. Her Zyprexa  and Lithium  were both adjusted. Does have a tremor that Cogentin  helps with a bit-- amantidine was not approved by insurance   PERTINENT  PMH / PSH/Family/Social History : Bipolar on lithium , prolonged QT, was referred to Cardiology   OBJECTIVE:   BP 106/72   Pulse (!) 114   Ht 5' 4 (1.626 m)   Wt 217 lb 6.4 oz (98.6 kg)   SpO2 99%   BMI 37.32 kg/m   Today's weight:  Last Weight  Most recent update: 09/25/2024 11:20 AM    Weight  98.6 kg (217 lb 6.4 oz)            Review of prior weights: Filed Weights   09/25/24 1120  Weight: 217 lb 6.4 oz (98.6 kg)     Cardiac: Regular rate and rhythm. Normal S1/S2. No murmurs, rubs, or gallops appreciated.HR in 90s on exam  Lungs: Clear bilaterally to ascultation.  Abdomen: Normoactive bowel sounds. No tenderness to deep or light palpation. No rebound or guarding.    Psych: Pleasant and appropriate   EKG obtained sinus tachycardia with QTC of 463   ASSESSMENT/PLAN:   Assessment & Plan Lithium  use Bipolar 2 disorder (HCC) Monitoring labs today including lithium  level, lipids CMP  Morbid obesity (HCC) A1C appropriate Lipids today  Rheumatoid arthritis with positive rheumatoid factor, involving unspecified site (HCC) On Plaquenil Just saw optometrist Requested records--suspected she needs a more specific exam with Plaquenil  Sees [facility]  Declined flu shot   Suzann Daring, MD  Family Medicine Teaching Service  [facility]  Medicine Center

## 2024-09-25 ENCOUNTER — Ambulatory Visit (HOSPITAL_COMMUNITY)
Admission: RE | Admit: 2024-09-25 | Discharge: 2024-09-25 | Disposition: A | Discharge status: Home / Self Care | Payer: MEDICAID | Source: Ambulatory Visit | Attending: Family Medicine | Admitting: Family Medicine

## 2024-09-25 ENCOUNTER — Encounter: Payer: Self-pay | Admitting: Family Medicine

## 2024-09-25 ENCOUNTER — Ambulatory Visit: Payer: MEDICAID | Admitting: Family Medicine

## 2024-09-25 VITALS — BP 106/72 | HR 114 | Ht 64.0 in | Wt 217.4 lb

## 2024-09-25 DIAGNOSIS — R000 Tachycardia, unspecified: Secondary | ICD-10-CM | POA: Diagnosis not present

## 2024-09-25 DIAGNOSIS — M059 Rheumatoid arthritis with rheumatoid factor, unspecified: Secondary | ICD-10-CM

## 2024-09-25 DIAGNOSIS — Z79899 Other long term (current) drug therapy: Secondary | ICD-10-CM | POA: Insufficient documentation

## 2024-09-25 DIAGNOSIS — F3181 Bipolar II disorder: Secondary | ICD-10-CM

## 2024-09-25 LAB — POCT GLYCOSYLATED HEMOGLOBIN (HGB A1C): Hemoglobin A1C: 4.8 % (ref 4.0–5.6)

## 2024-09-25 NOTE — Assessment & Plan Note (Signed)
 A1C appropriate Lipids today

## 2024-09-25 NOTE — Assessment & Plan Note (Signed)
 Monitoring labs today including lithium  level, lipids CMP

## 2024-09-25 NOTE — Assessment & Plan Note (Signed)
 On Plaquenil Just saw optometrist Requested records--suspected she needs a more specific exam with Plaquenil  Sees [facility]

## 2024-09-25 NOTE — Patient Instructions (Signed)
 It was wonderful to see you today.  Please bring ALL of your medications with you to every visit.   Today we talked about:  - You do NOT have diabetes  We will check labs including your Lithium  level  Your EKG looks better than prior    Please follow up in 6 months   Thank you for choosing [facility].   Please call [phone] with any questions about today's appointment.  Please be sure to schedule follow up at the front  desk before you leave today.   Suzann Daring, MD  Family Medicine

## 2024-09-26 ENCOUNTER — Ambulatory Visit: Payer: Self-pay | Admitting: Family Medicine

## 2024-09-26 LAB — CBC
Hematocrit: 38.8 % (ref 34.0–46.6)
Hemoglobin: 12.6 g/dL (ref 11.1–15.9)
MCH: 29 pg (ref 26.6–33.0)
MCHC: 32.5 g/dL (ref 31.5–35.7)
MCV: 89 fL (ref 79–97)
Platelets: 294 x10E3/uL (ref 150–450)
RBC: 4.34 x10E6/uL (ref 3.77–5.28)
RDW: 13.5 % (ref 11.7–15.4)
WBC: 12.9 x10E3/uL — ABNORMAL HIGH (ref 3.4–10.8)

## 2024-09-26 LAB — COMPREHENSIVE METABOLIC PANEL WITH GFR
ALT: 8 IU/L (ref 0–32)
AST: 17 IU/L (ref 0–40)
Albumin: 4.2 g/dL (ref 3.9–4.9)
Alkaline Phosphatase: 129 IU/L — ABNORMAL HIGH (ref 41–116)
BUN/Creatinine Ratio: 9 (ref 9–23)
BUN: 6 mg/dL (ref 6–20)
Bilirubin Total: 0.5 mg/dL (ref 0.0–1.2)
CO2: 19 mmol/L — ABNORMAL LOW (ref 20–29)
Calcium: 9.6 mg/dL (ref 8.7–10.2)
Chloride: 103 mmol/L (ref 96–106)
Creatinine, Ser: 0.7 mg/dL (ref 0.57–1.00)
Globulin, Total: 3.8 g/dL (ref 1.5–4.5)
Glucose: 91 mg/dL (ref 70–99)
Potassium: 3.9 mmol/L (ref 3.5–5.2)
Sodium: 139 mmol/L (ref 134–144)
Total Protein: 8 g/dL (ref 6.0–8.5)
eGFR: 117 mL/min/1.73 (ref >59)

## 2024-09-26 LAB — LIPID PANEL
Chol/HDL Ratio: 4.1 ratio (ref 0.0–4.4)
Cholesterol, Total: 186 mg/dL (ref 100–199)
HDL: 45 mg/dL (ref >39)
LDL Chol Calc (NIH): 113 mg/dL — ABNORMAL HIGH (ref 0–99)
Triglycerides: 161 mg/dL — ABNORMAL HIGH (ref 0–149)
VLDL Cholesterol Cal: 28 mg/dL (ref 5–40)

## 2024-09-26 LAB — LITHIUM LEVEL: Lithium Lvl: 0.8 mmol/L (ref 0.5–1.2)

## 2024-09-26 LAB — TSH RFX ON ABNORMAL TO FREE T4: TSH: 1.5 u[IU]/mL (ref 0.450–4.500)

## 2024-09-29 ENCOUNTER — Telehealth: Payer: Self-pay

## 2024-09-29 NOTE — Telephone Encounter (Signed)
 Patient calls nurse line regarding Plaquenil.  She reports she has noticed her eye lids have been swollen for a while now. She reports she notices the swelling more in the mornings when she first wakes up.   She denies any vision changes or drainage.   She reports she researched Plaquenil and reports this was a listed side effect.   She reports she forgot to mention this at recent PCP visit.   She reports she would like PCP opinion on staying on this medication or deferring to Rheumatologist.   Advised will forward to PCP.

## 2024-09-29 NOTE — Telephone Encounter (Signed)
 Called and advised patient to continue Plaquenil   All questions answered.   Suzann Daring, MD  Family Medicine Teaching Service

## 2024-10-05 ENCOUNTER — Telehealth: Payer: Self-pay | Admitting: Family Medicine

## 2024-10-05 DIAGNOSIS — M059 Rheumatoid arthritis with rheumatoid factor, unspecified: Secondary | ICD-10-CM

## 2024-10-05 NOTE — Telephone Encounter (Signed)
 Called and discussed--recommended going to other eye doctor as directed by [facility] eye care.   Suzann Daring, MD  Family Medicine Teaching Service

## 2024-10-05 NOTE — Telephone Encounter (Signed)
 Referral to [facility]  Suzann Daring, MD  Family Medicine Teaching Service
